# Patient Record
Sex: Male | Born: 1945 | Race: White | Hispanic: No | Marital: Single | Smoking: Never smoker
Health system: Southern US, Community
[De-identification: ages and names within clinical notes are randomized; demographics above are authoritative.]

## PROBLEM LIST (undated history)

## (undated) DIAGNOSIS — F329 Major depressive disorder, single episode, unspecified: Secondary | ICD-10-CM

## (undated) DIAGNOSIS — I10 Essential (primary) hypertension: Secondary | ICD-10-CM

## (undated) DIAGNOSIS — N390 Urinary tract infection, site not specified: Secondary | ICD-10-CM

## (undated) DIAGNOSIS — F1010 Alcohol abuse, uncomplicated: Secondary | ICD-10-CM

## (undated) DIAGNOSIS — F32A Depression, unspecified: Secondary | ICD-10-CM

---

## 1998-03-12 ENCOUNTER — Inpatient Hospital Stay (HOSPITAL_COMMUNITY): Admission: EM | Admit: 1998-03-12 | Discharge: 1998-03-13 | Payer: Self-pay | Admitting: Emergency Medicine

## 2000-11-21 ENCOUNTER — Emergency Department (HOSPITAL_COMMUNITY): Admission: EM | Admit: 2000-11-21 | Discharge: 2000-11-21 | Payer: Self-pay | Admitting: Internal Medicine

## 2000-11-21 ENCOUNTER — Encounter: Payer: Self-pay | Admitting: Internal Medicine

## 2002-04-08 ENCOUNTER — Encounter: Payer: Self-pay | Admitting: Emergency Medicine

## 2002-04-08 ENCOUNTER — Emergency Department (HOSPITAL_COMMUNITY): Admission: EM | Admit: 2002-04-08 | Discharge: 2002-04-08 | Payer: Self-pay | Admitting: Emergency Medicine

## 2003-12-02 ENCOUNTER — Emergency Department (HOSPITAL_COMMUNITY): Admission: EM | Admit: 2003-12-02 | Discharge: 2003-12-02 | Payer: Self-pay | Admitting: Emergency Medicine

## 2007-07-27 ENCOUNTER — Emergency Department (HOSPITAL_COMMUNITY): Admission: EM | Admit: 2007-07-27 | Discharge: 2007-07-27 | Payer: Self-pay | Admitting: Emergency Medicine

## 2007-07-27 IMAGING — CT CT CHEST W/ CM
2 of 3 series · 15 of 36 positions shown, 18 images · IV contrast (APPLIED)
Comparison: none

CLINICAL DATA: Fell [REDACTED], left shoulder pain. 
CHEST CT WITH CONTRAST:
TECHNIQUE: Multidetector CT imaging of the chest was performed following the standard protocol during bolus administration of intravenous contrast.
Contrast:  100 cc Omnipaque 300.

[Series 4: routine chest 5.0 st · axial · 0.57mm/px · z∈[-318,-38]mm · 12 of 66 slices shown, 15 images]
[im 5/66  mediastinal]
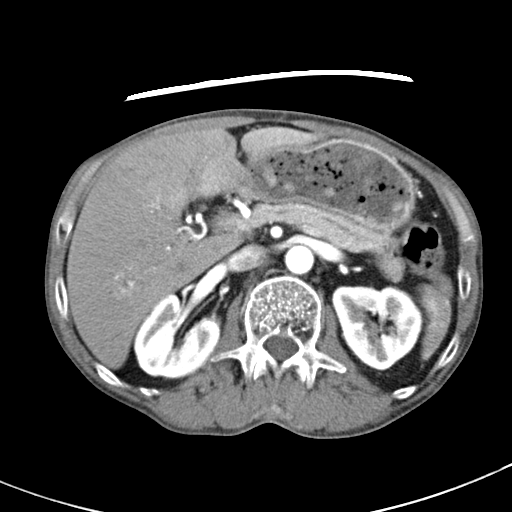
[im 5/66  lung]
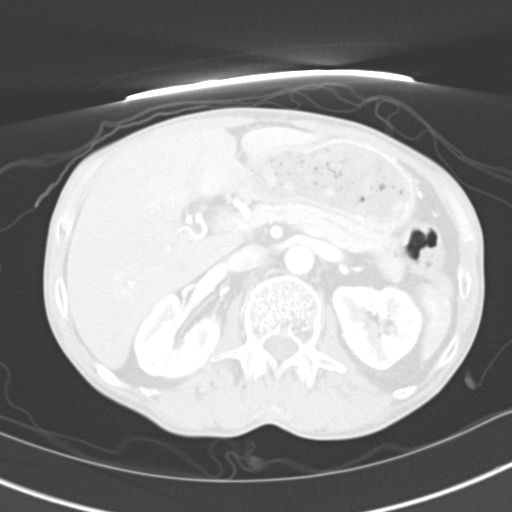
[im 10/66  lung]
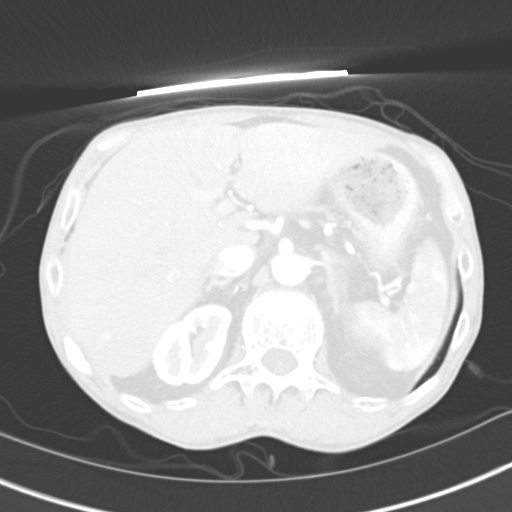
[im 15/66  lung]
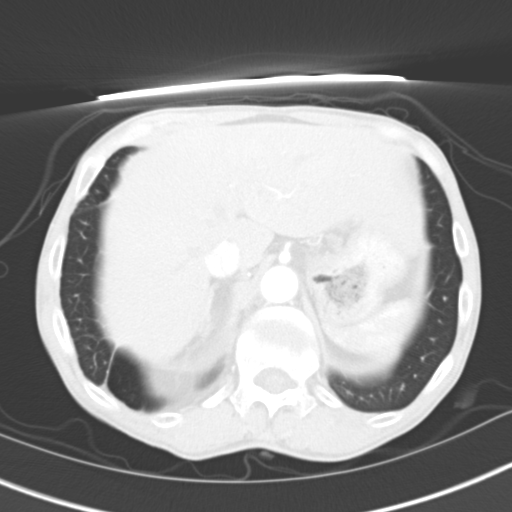
[im 20/66  lung]
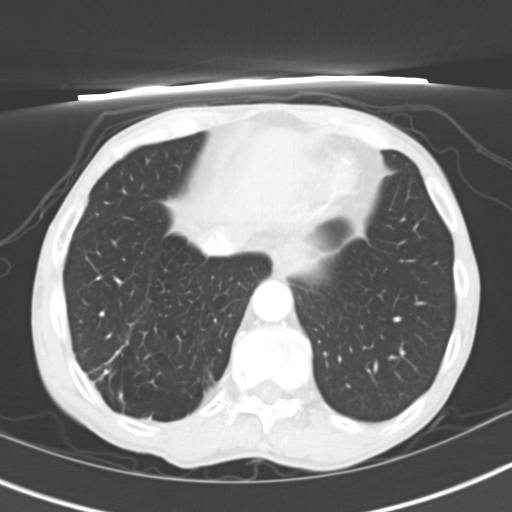
[im 25/66  mediastinal]
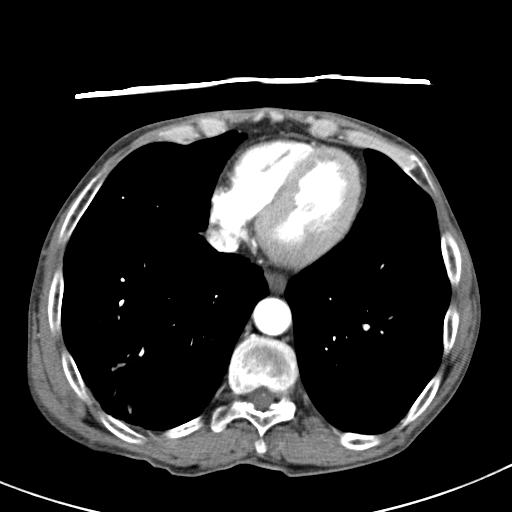
[im 25/66  lung]
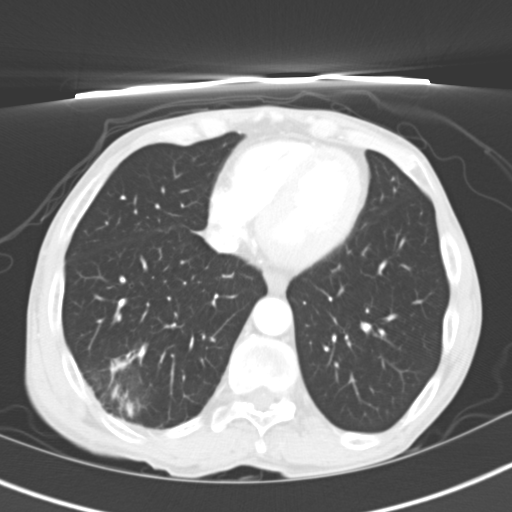
[im 29/66  lung]
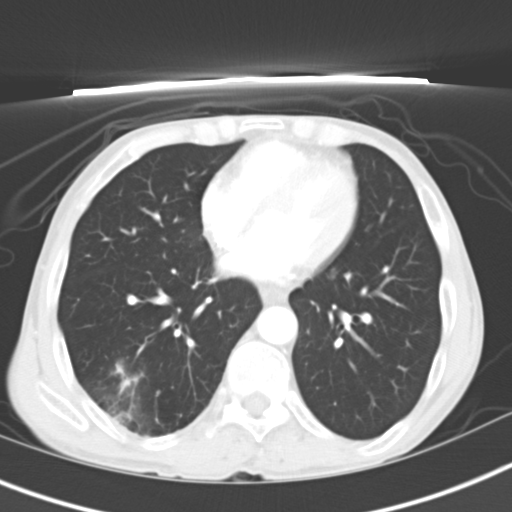
[im 37/66  lung]
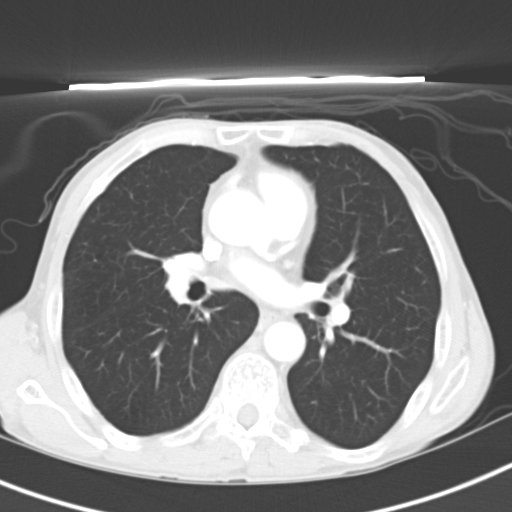
[im 41/66  lung]
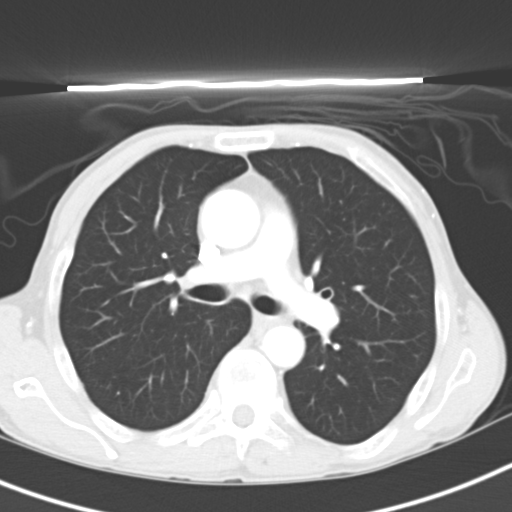
[im 46/66  mediastinal]
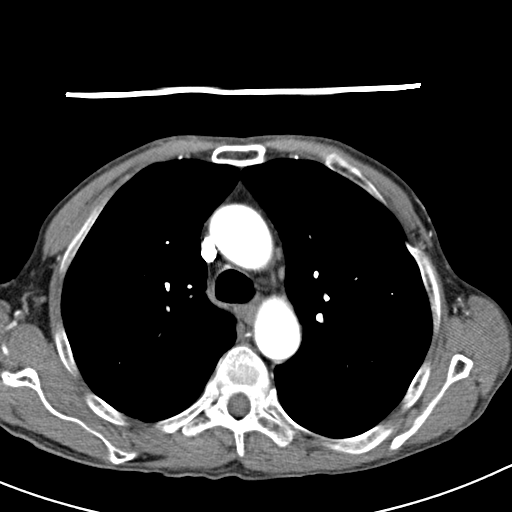
[im 46/66  lung]
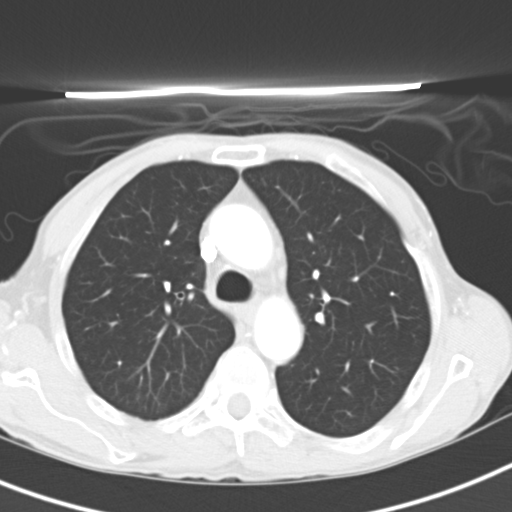
[im 51/66  lung]
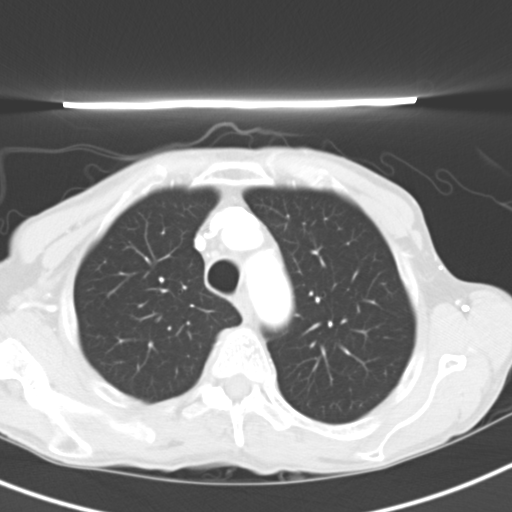
[im 56/66  lung]
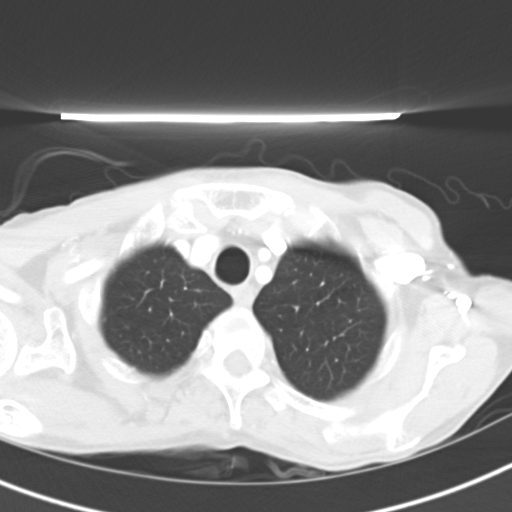
[im 61/66  lung]
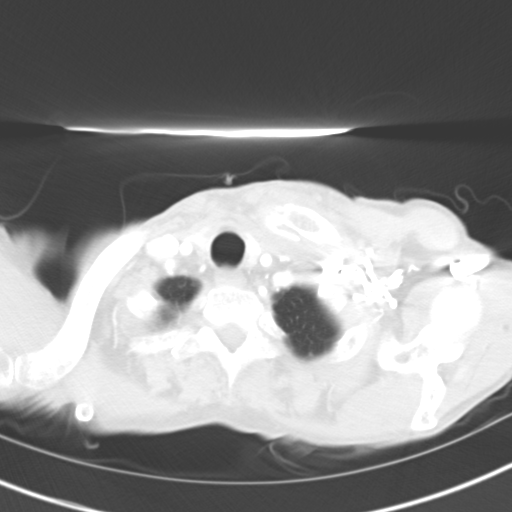

[Series 602: coronal chest · coronal · 0.64mm/px · 3 of 102 slices shown]
[im 21/102  lung]
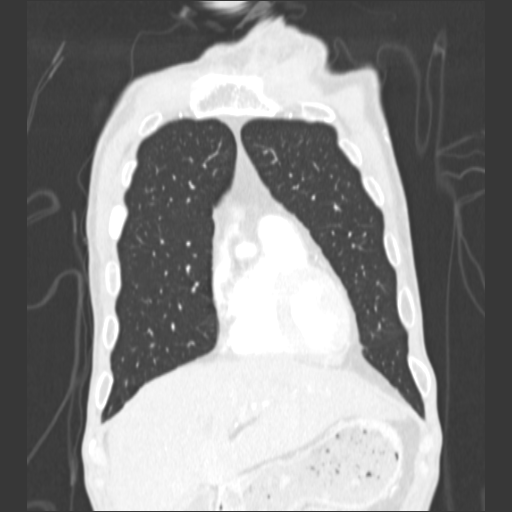
[im 41/102  lung]
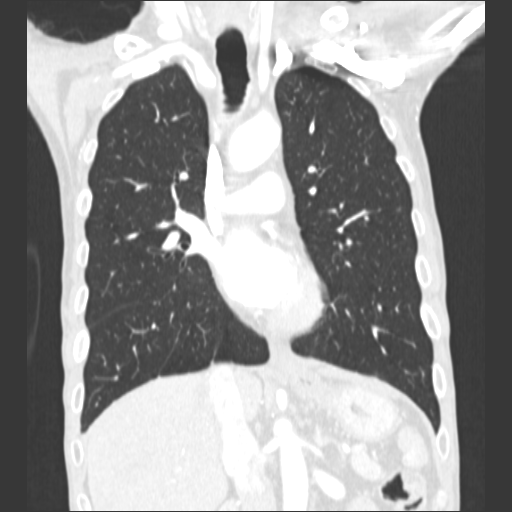
[im 61/102  lung]
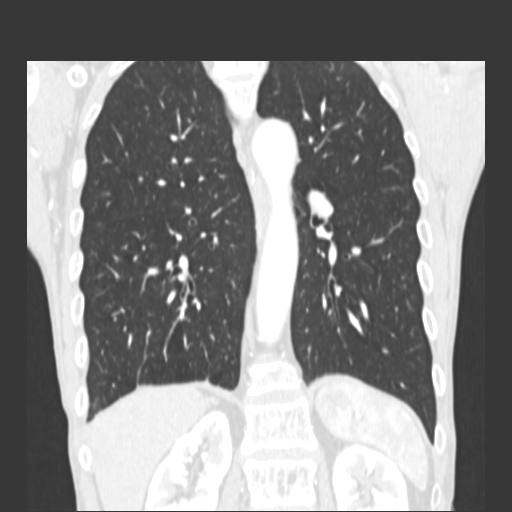

[15 of 36 positions shown; findings below may reference images not displayed]

FINDINGS: On lung window images there is opacity posteriorly in the right lower lobe corresponding to the area questioned on chest x-ray at the right lung base.   This could represent developing pneumonia with atelectasis also a consideration.  There are healing anterior right 4th and 5th rib fractures with some callus.  No pneumothorax is seen and no pleural effusion is noted.  No mediastinal or hilar adenopathy is seen.  The pulmonary arteries and thoracic aorta opacify with no acute abnormality.  Coronary artery calcifications are noted.  Probable hemangioma is noted within the right lobe of liver posteriorly with peripheral enhancement, but further evaluation with CT or MRI of the abdomen may be helpful.
IMPRESSION: Opacity at right lung base corresponds to parenchymal opacity in the right lower lobe by CT of the chest.  consideration is that of pneumonia

## 2007-07-27 IMAGING — CR DG SHOULDER 2+V*L*
3 series · 3 of 3 positions shown · non-contrast
Comparison: None.

LEFT SHOULDER - 3  VIEW:

CLINICAL DATA: Fell 2 days ago. Shoulder pain.

[w shoulder ap internal left]
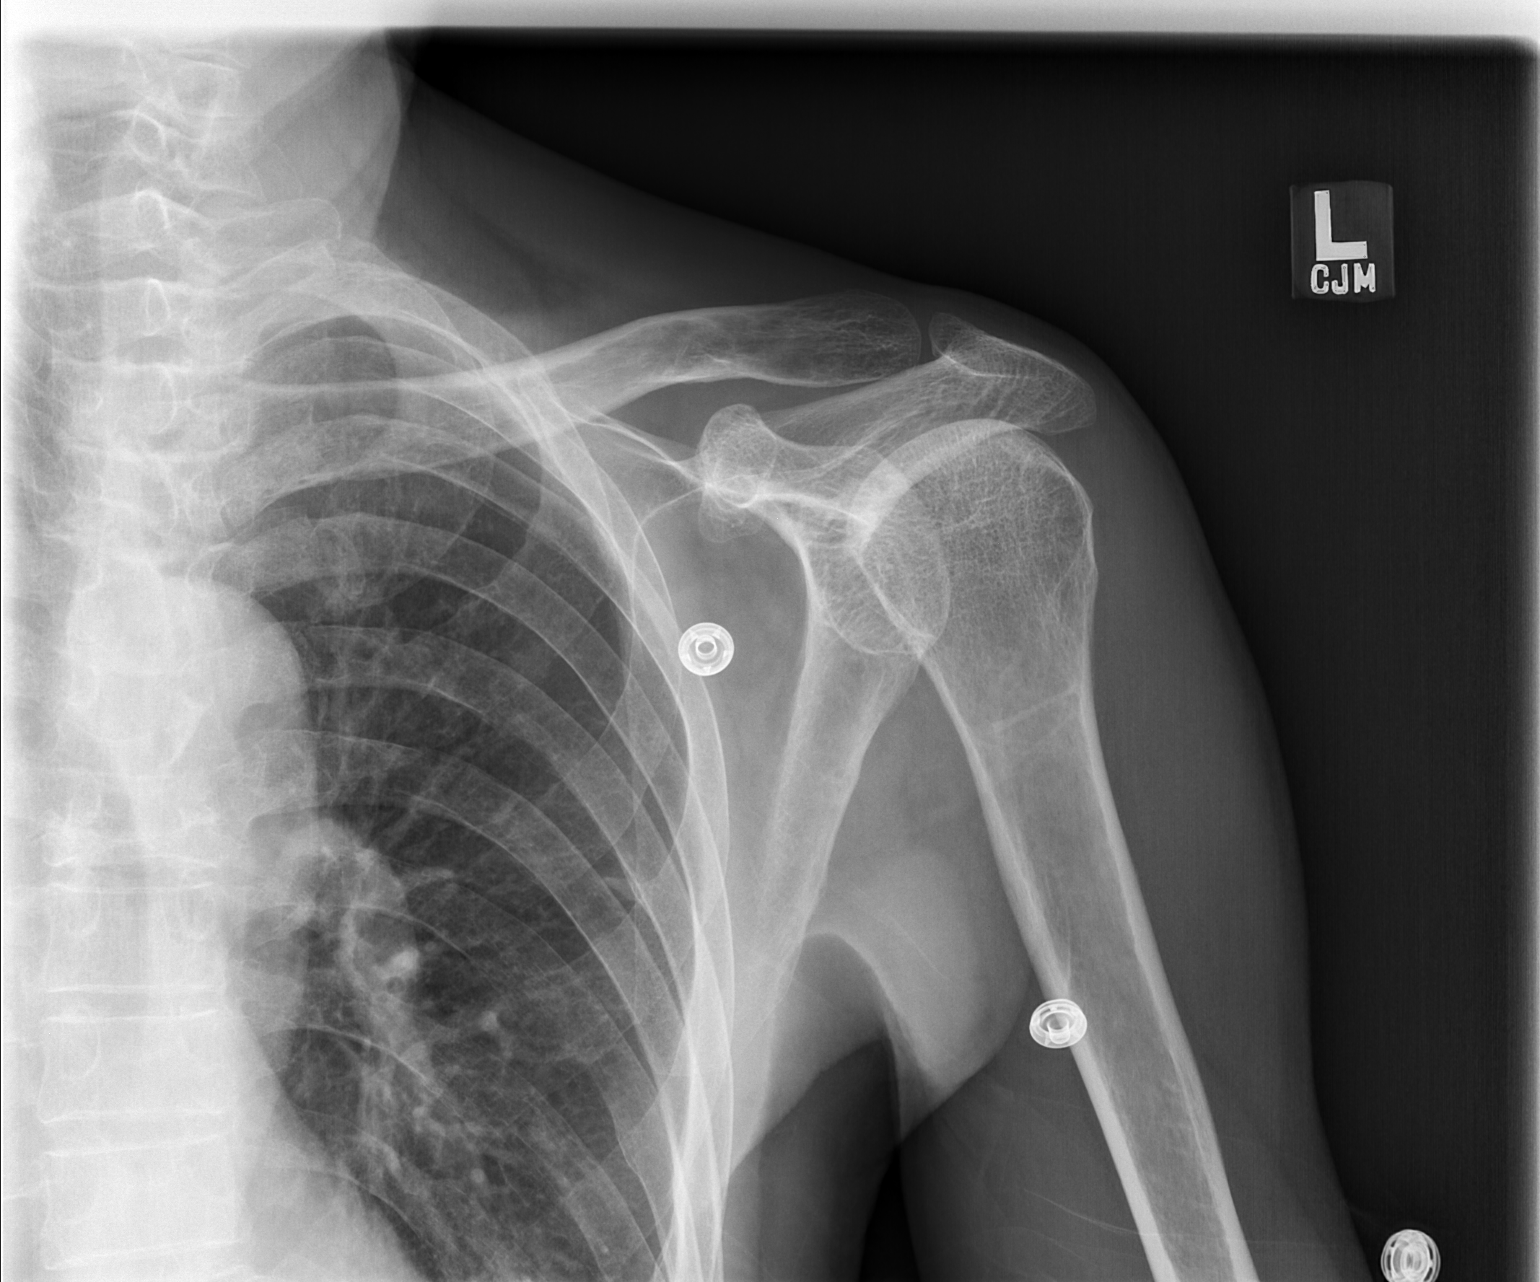

[w shoulder ap external left]
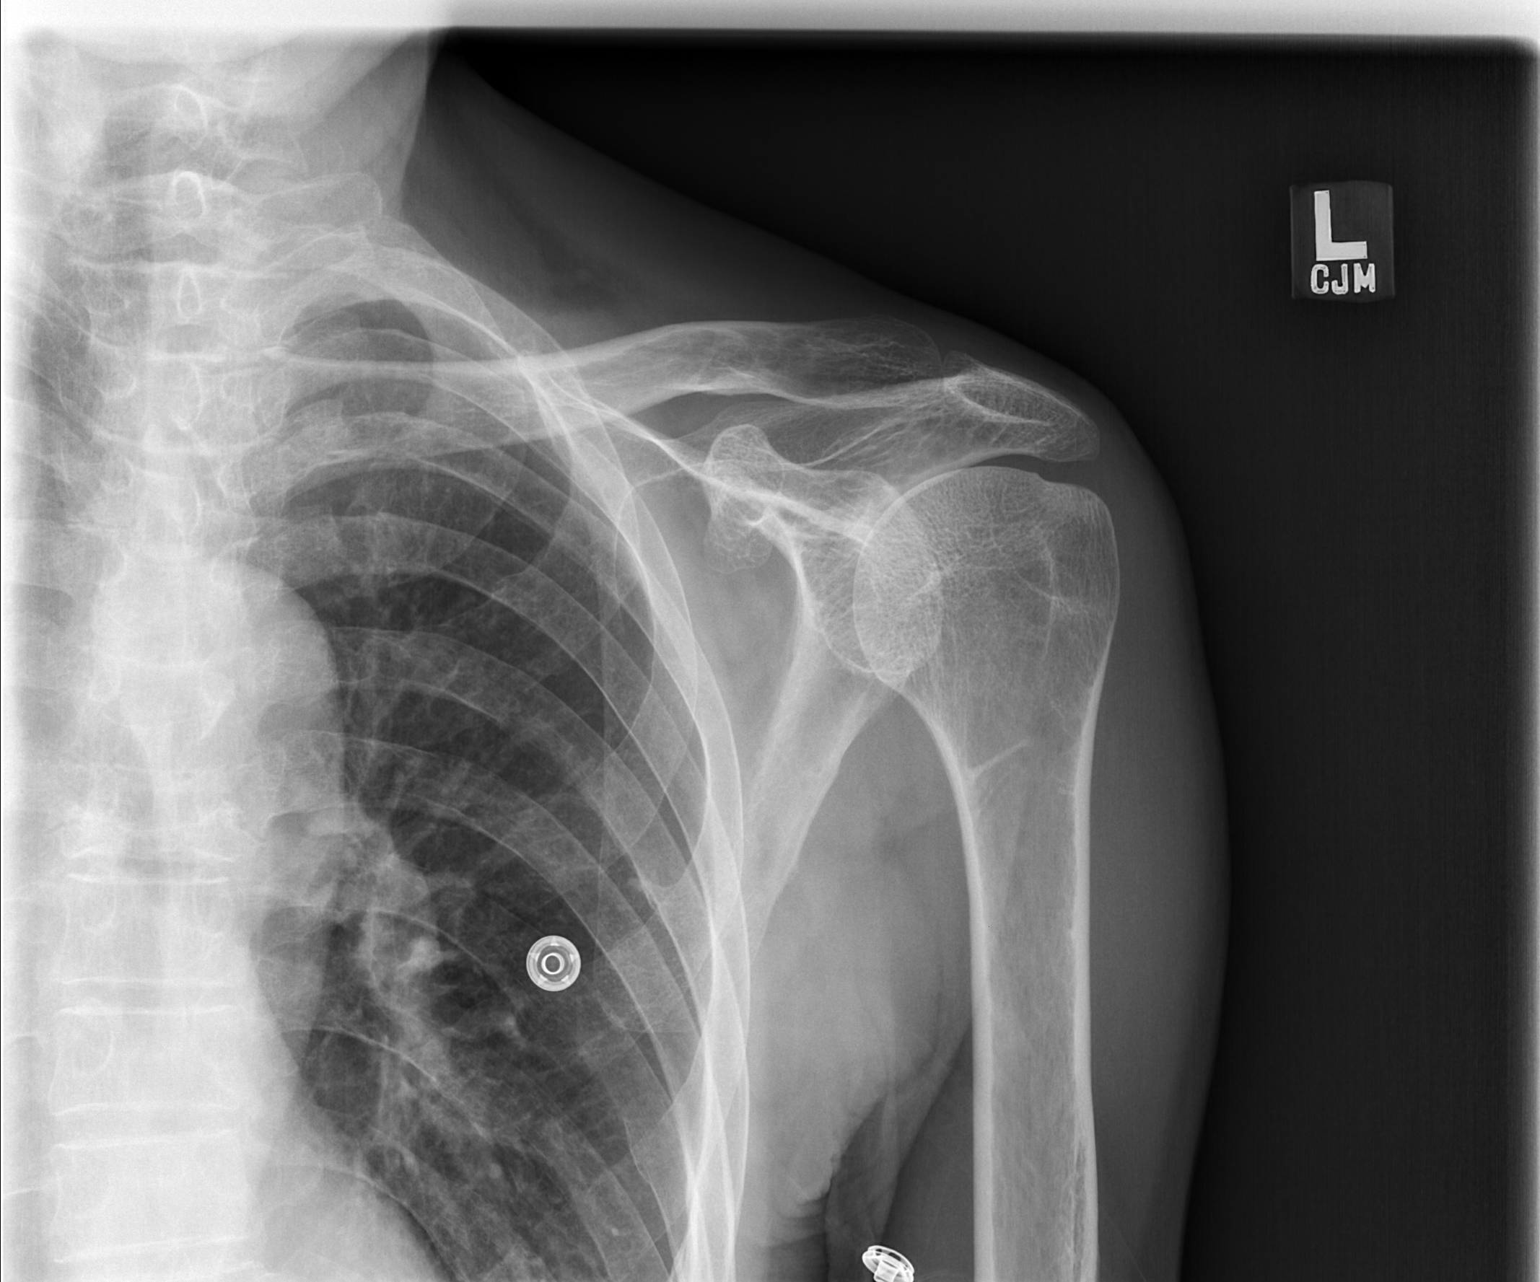

[w shoulder y view left]
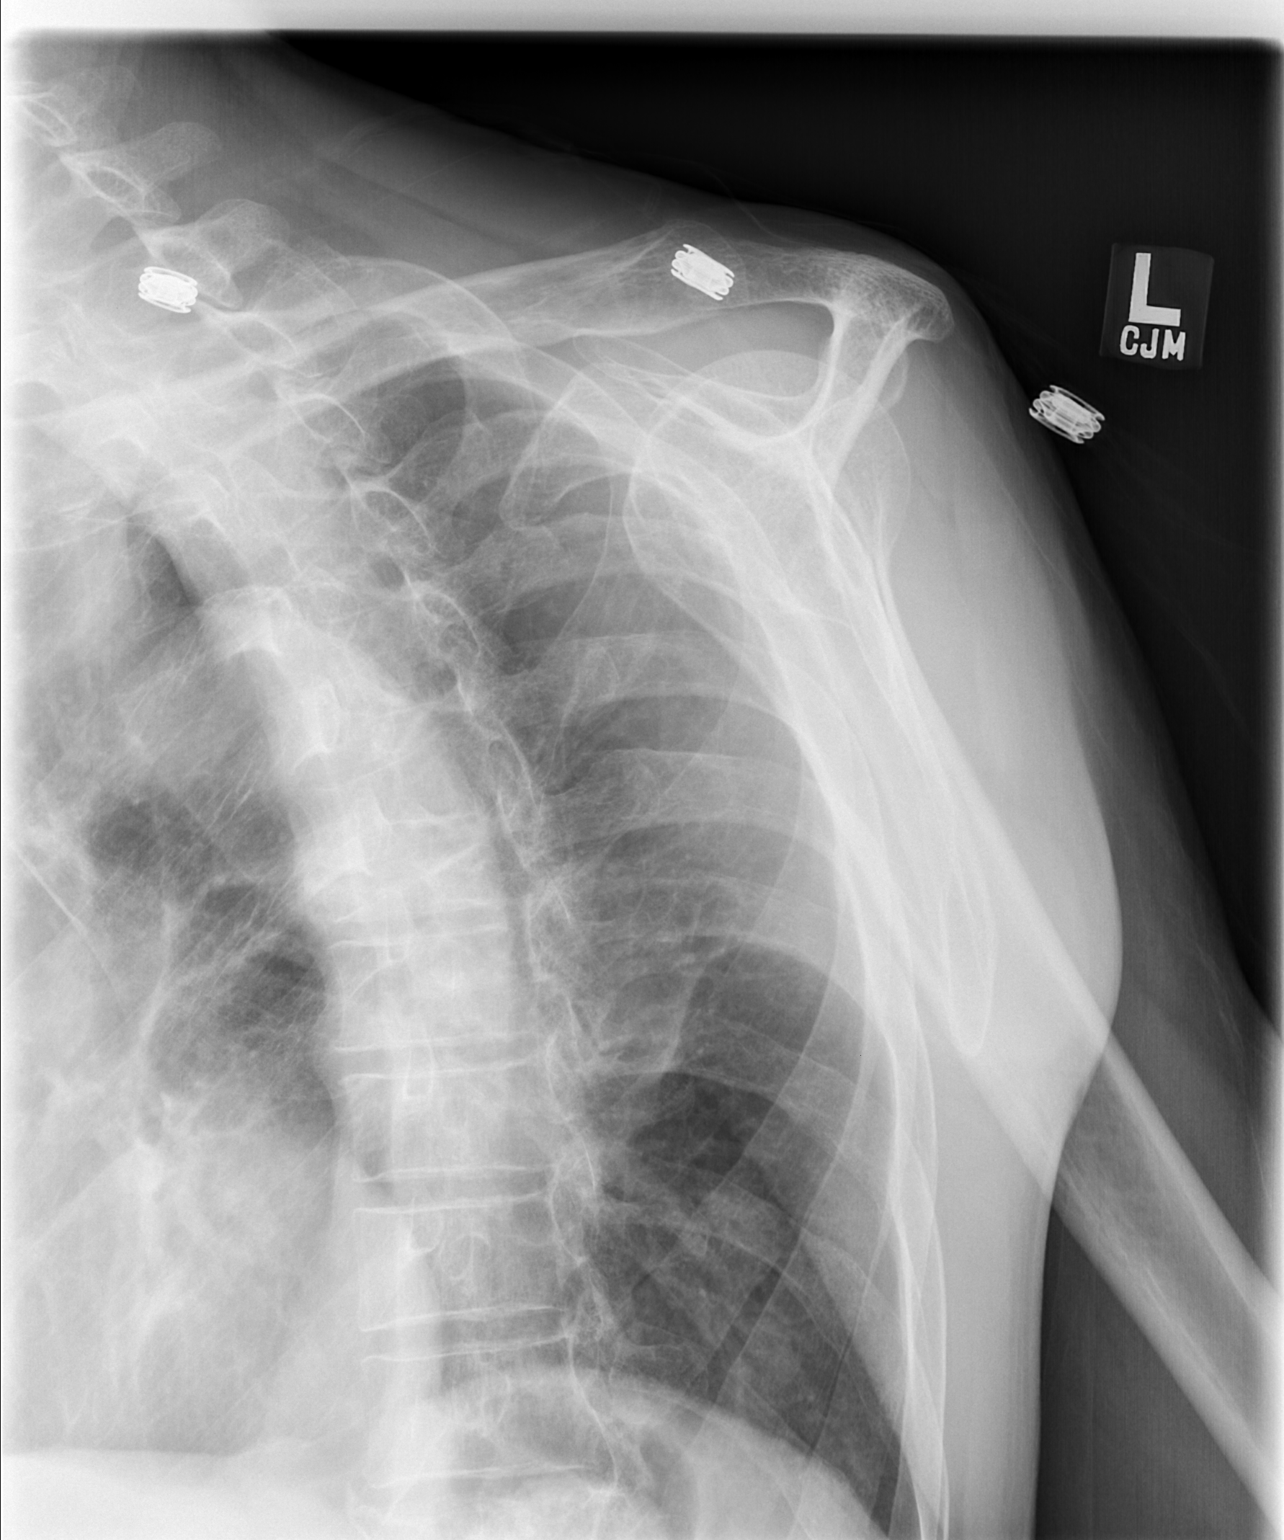

[3 of 3 positions shown; findings below may reference images not displayed]

FINDINGS: No evidence for acute fracture, dislocation, or separation. Nonacute
fracture of the posterior left 8th rib is evident. Bones are diffusely
demineralized.
IMPRESSION: No acute bony abnormality.

## 2007-07-27 IMAGING — CR DG CHEST 2V
2 series · 2 of 2 positions shown · non-contrast
Comparison: None

CLINICAL DATA: Fall with sternal bruising in the shoulder pain.

CHEST - 2 VIEW

[w chest pa]
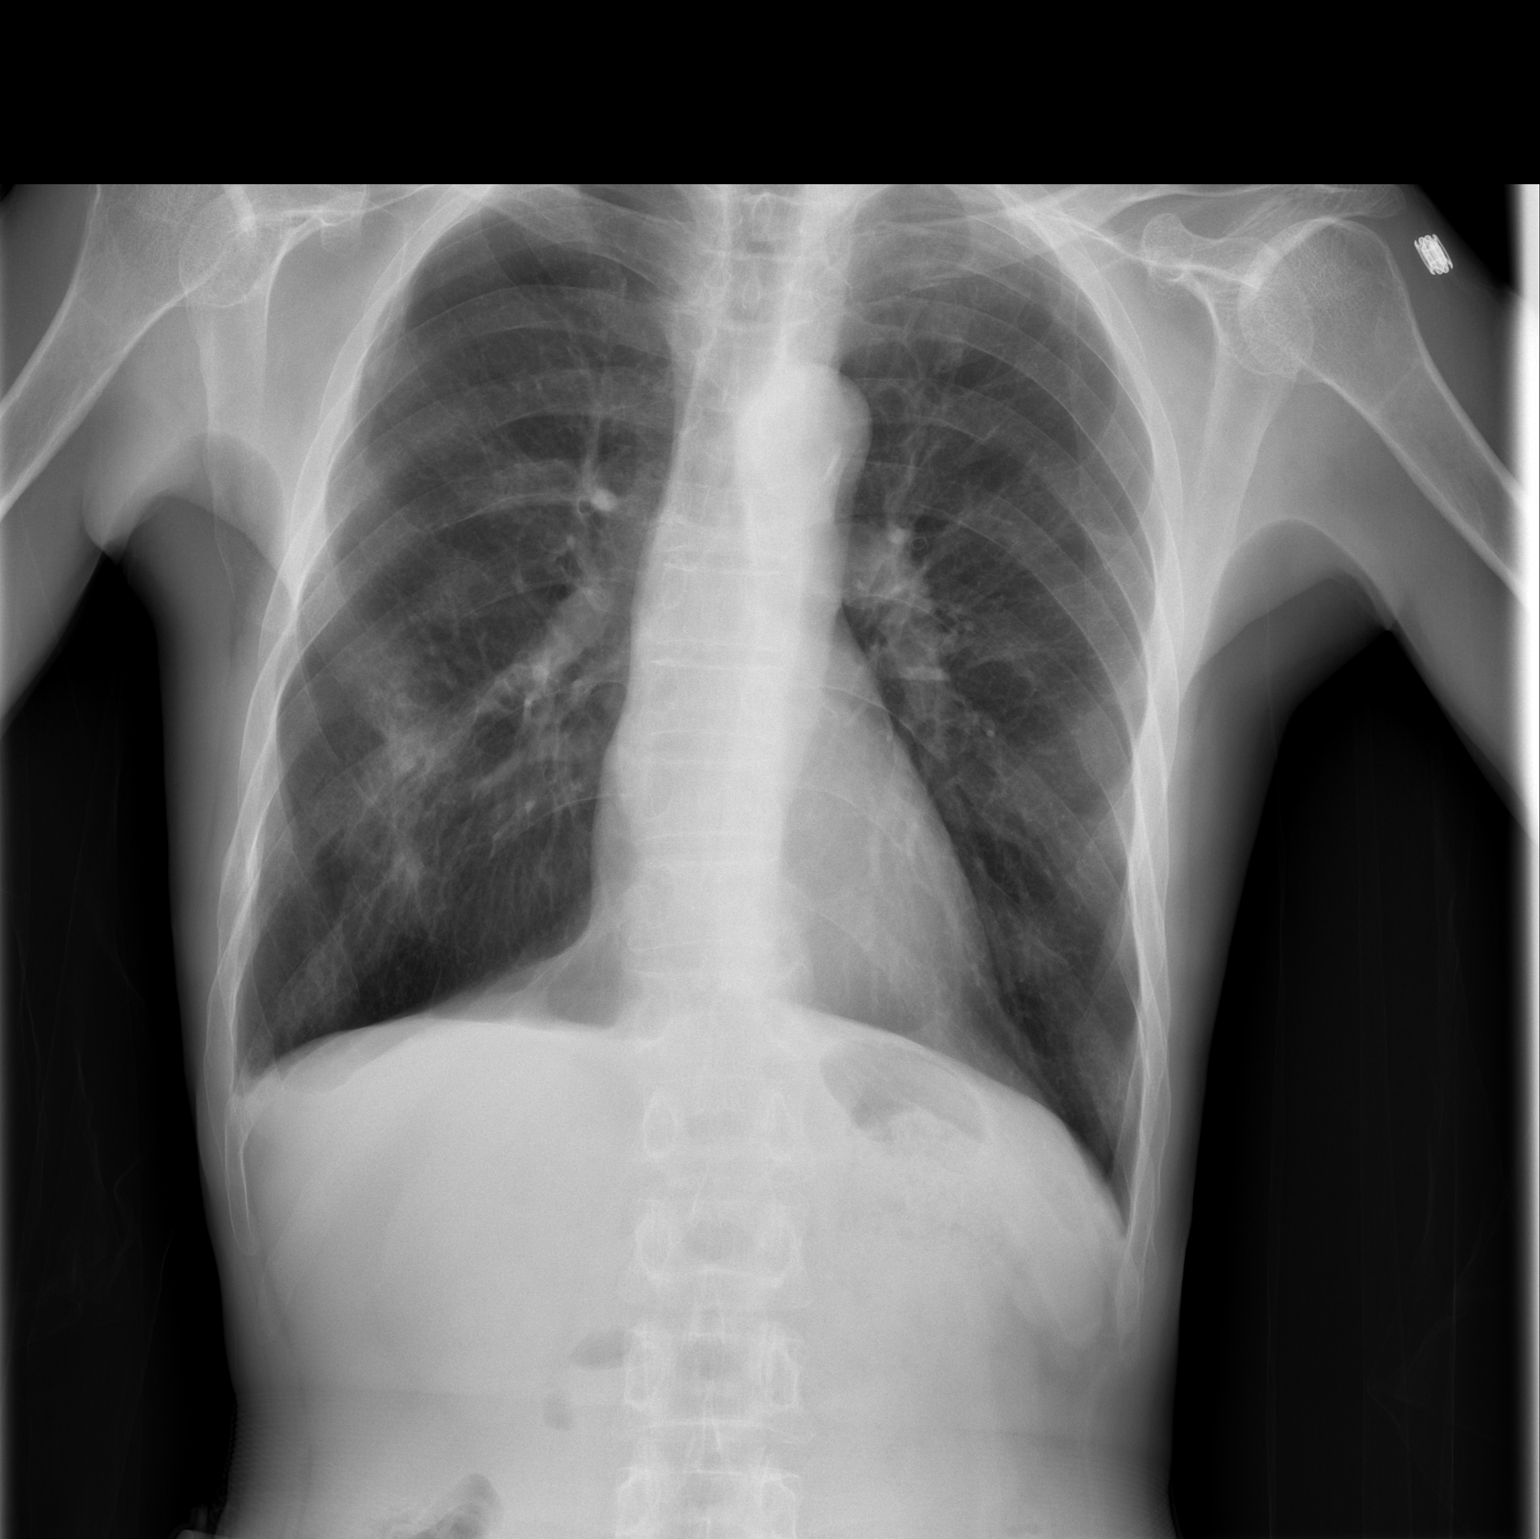

[w chest lat]
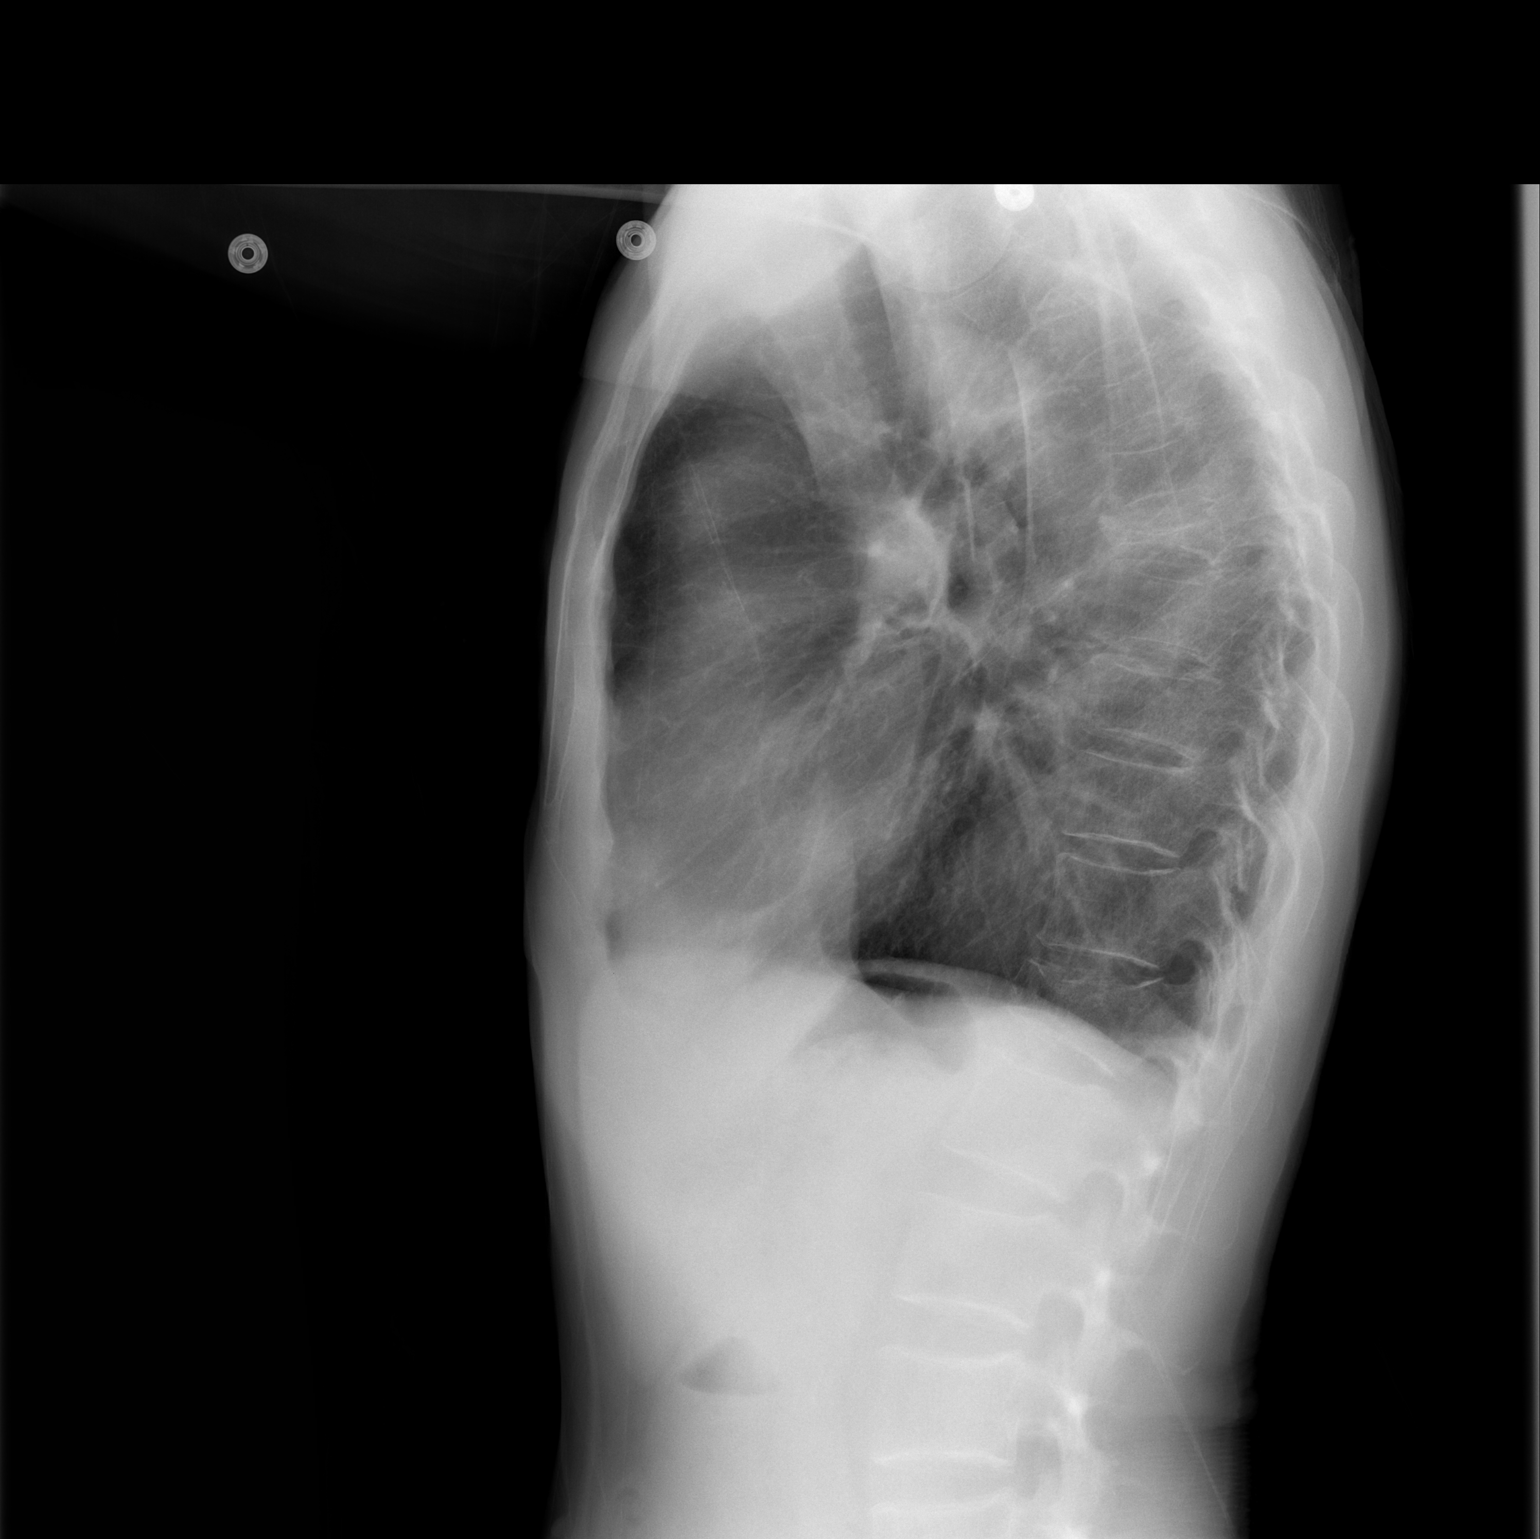

[2 of 2 positions shown; findings below may reference images not displayed]

FINDINGS: There is abnormal indistinct opacity in the right lower lobe which
could represent pulmonary contusion, pneumonia, or possibly even lung cancer. CT
scan or careful conventional radiographic followup is recommended.

There is a suggestion of mild bony irregularity along the medial left clavicle.
Although this could be due to superimposition from the left first and fourth
ribs, the possibility of the medial left clavicular fracture is raised. This
could also be assessed on chest CT.

The cardiac and mediastinal contours appear unremarkable. A small blunted right
lateral costophrenic angle suggesting a small amount of right pleural fluid.
Mild irregularities of several bilateral rib suggest old rib fractures.

There is a nearly 100% compression fracture of the T7 vertebral body, with mild
sclerosis suggesting a subacute fracture. There is a also an anterior wedge
compression fracture of T12 which is probably old.

No pneumomediastinum is identified. No discrete pneumothorax is noted.

IMPRESSION

1. Ill-defined right lower lobe airspace opacity could represent pulmonary
contusion, localize pneumonia, or even lung cancer. CT scan of chest is
recommended for further evaluation.
2. Old bilateral rib fractures are noted.
3. A T7 compression fracture is likely subacute. There is also a superior
endplate compression fracture at T12 which is likely old.
4. Mild irregularity along the medial aspect of the left clavicle could
represent a nondisplaced clavicular fracture. This could also be assessed in
chest CT.
5. Trace right pleural effusion.

## 2009-07-20 ENCOUNTER — Emergency Department (HOSPITAL_COMMUNITY): Admission: EM | Admit: 2009-07-20 | Discharge: 2009-07-20 | Payer: Self-pay | Admitting: Emergency Medicine

## 2009-11-01 ENCOUNTER — Emergency Department (HOSPITAL_COMMUNITY): Admission: EM | Admit: 2009-11-01 | Discharge: 2009-11-01 | Payer: Self-pay | Admitting: Emergency Medicine

## 2009-11-01 IMAGING — CR DG LUMBAR SPINE COMPLETE 4+V
5 series · 5 of 5 positions shown · non-contrast
Comparison: Chest CT [DATE]

CLINICAL DATA: Back pain.  Left flank pain.

LUMBAR SPINE - COMPLETE 4+ VIEW

[t l-spine a.p.]
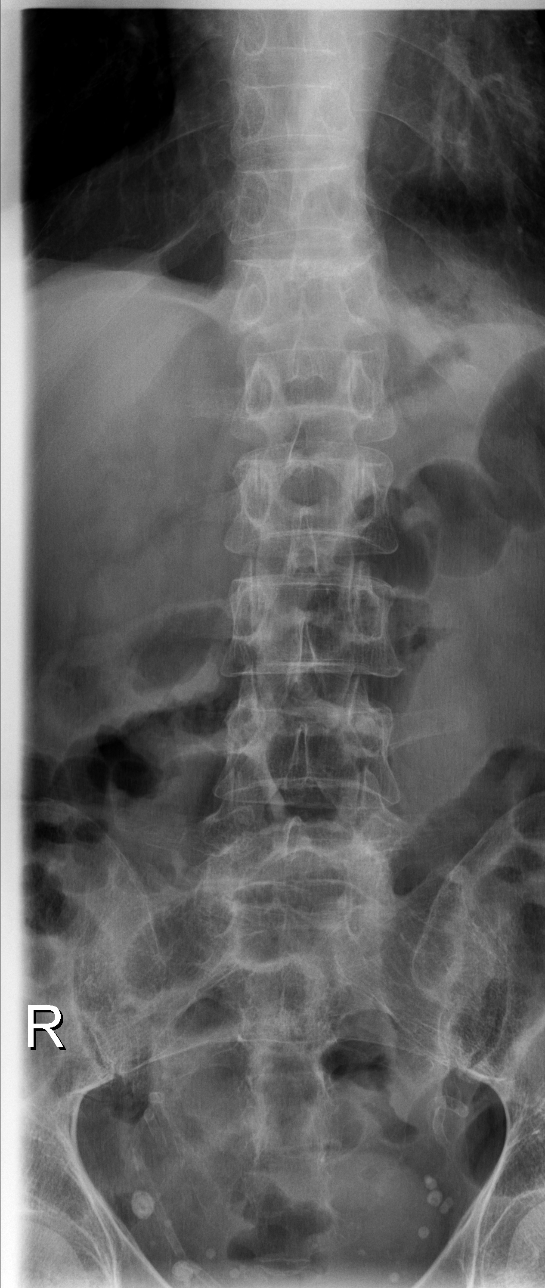

[t l-spine oblique exposure (1 of 2)]
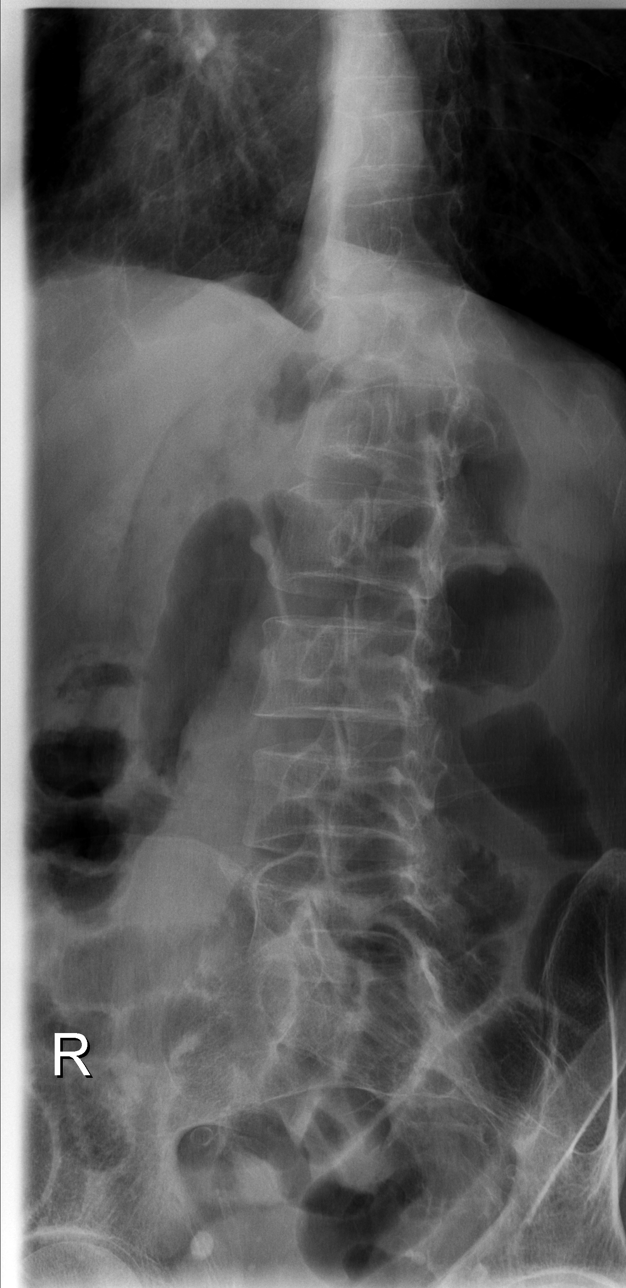

[t l-spine oblique exposure (2 of 2)]
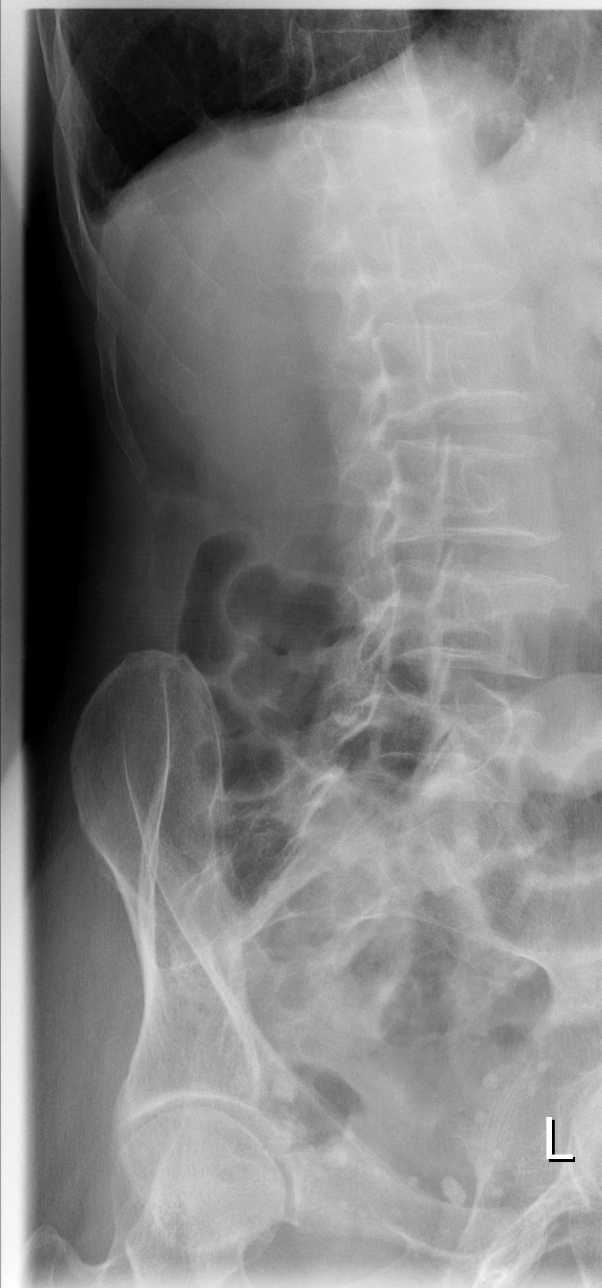

[t l-spine lat]
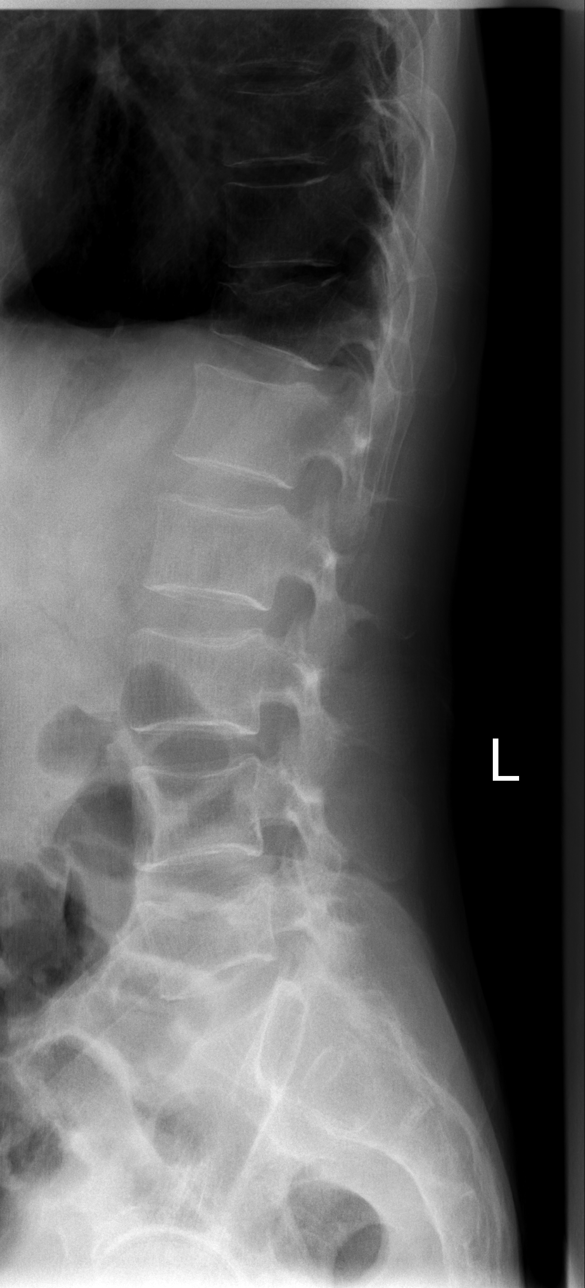

[t l-spine l5-s1 spot]
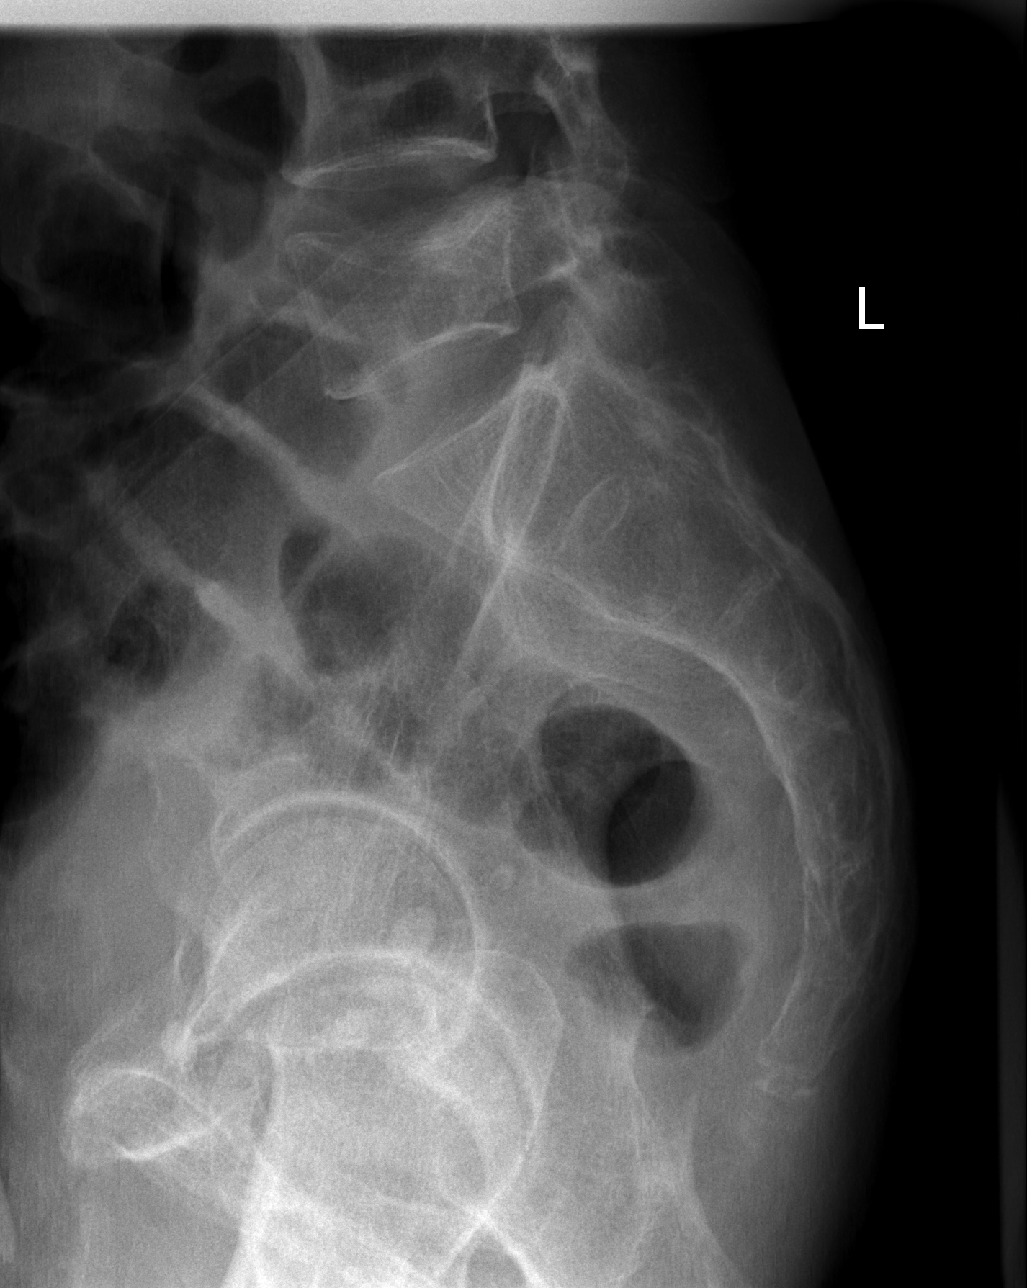

[5 of 5 positions shown; findings below may reference images not displayed]

FINDINGS: There is an old compression deformity at T12 which
appears the same.  There is some loss of height that the superior
endplate of L5, age indeterminate.  Disc space heights are normal.
No facet arthropathy.  Sacroiliac joints appear unremarkable.
IMPRESSION: Old compression fracture at T12, based on comparison with chest CT
[DATE].

Superior endplate deformity at L5.  I suspect this is probably old,
but cannot state that with certainty as I do not have any
comparison exam.

## 2009-11-01 IMAGING — CR DG RIBS W/ CHEST 3+V*L*
5 series · 5 of 5 positions shown · non-contrast
Comparison: CT [DATE]

CLINICAL DATA: Left flank pain.  Back pain.

LEFT RIBS AND CHEST - 3+ VIEW

[t ribs obl. left (1 of 2)]
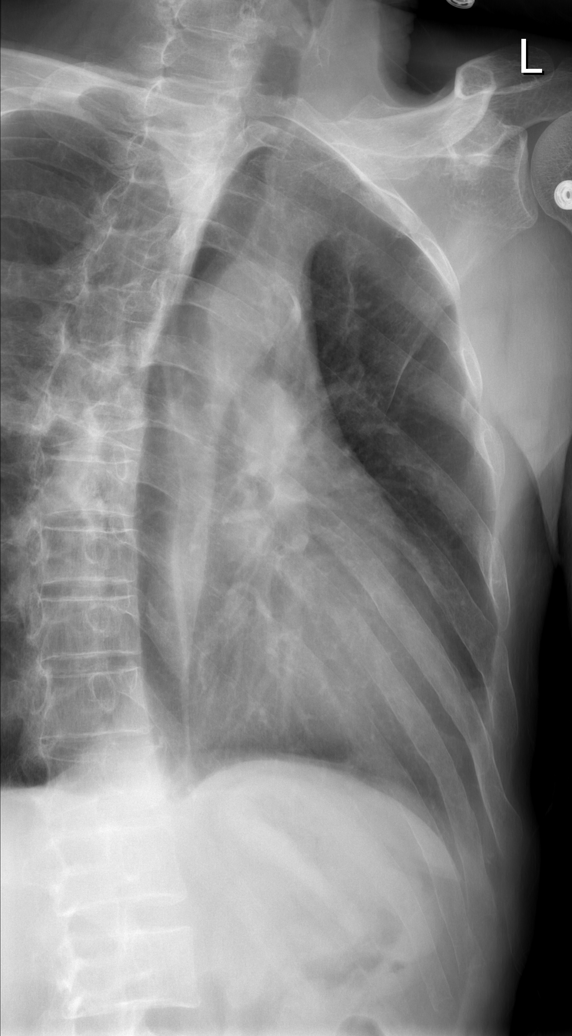

[t ribs obl. left (2 of 2)]
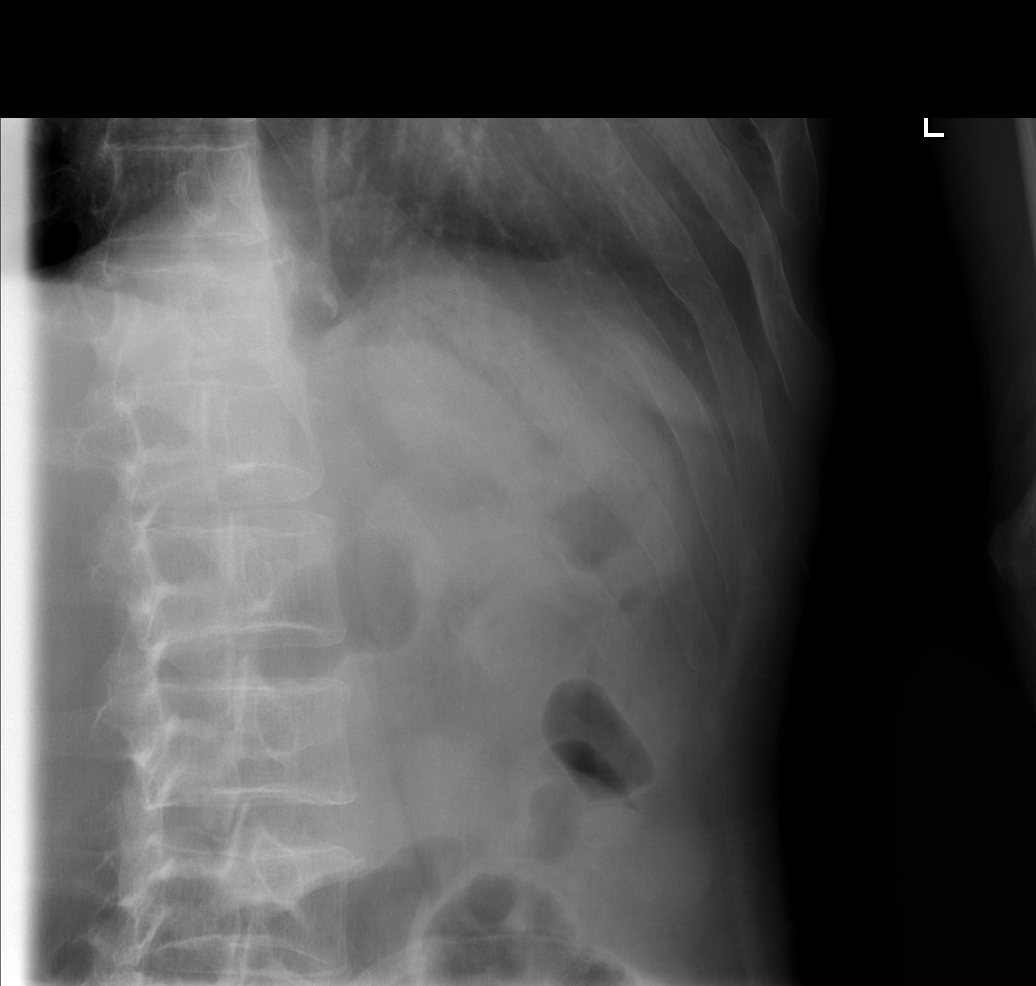

[t ribs ap/pa upper left]
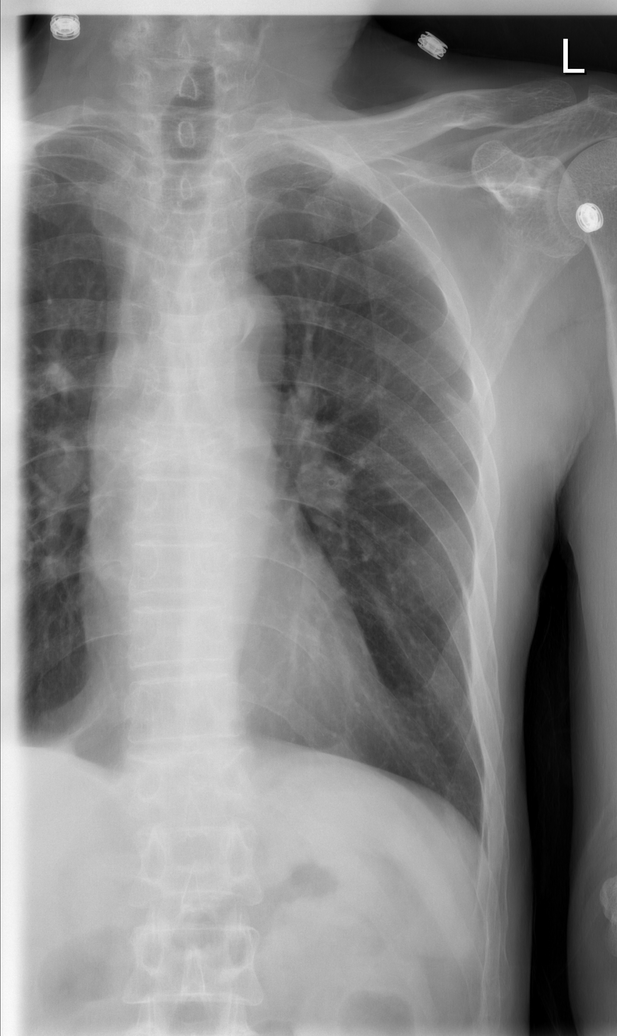

[t ribs ap/pa  lower left]
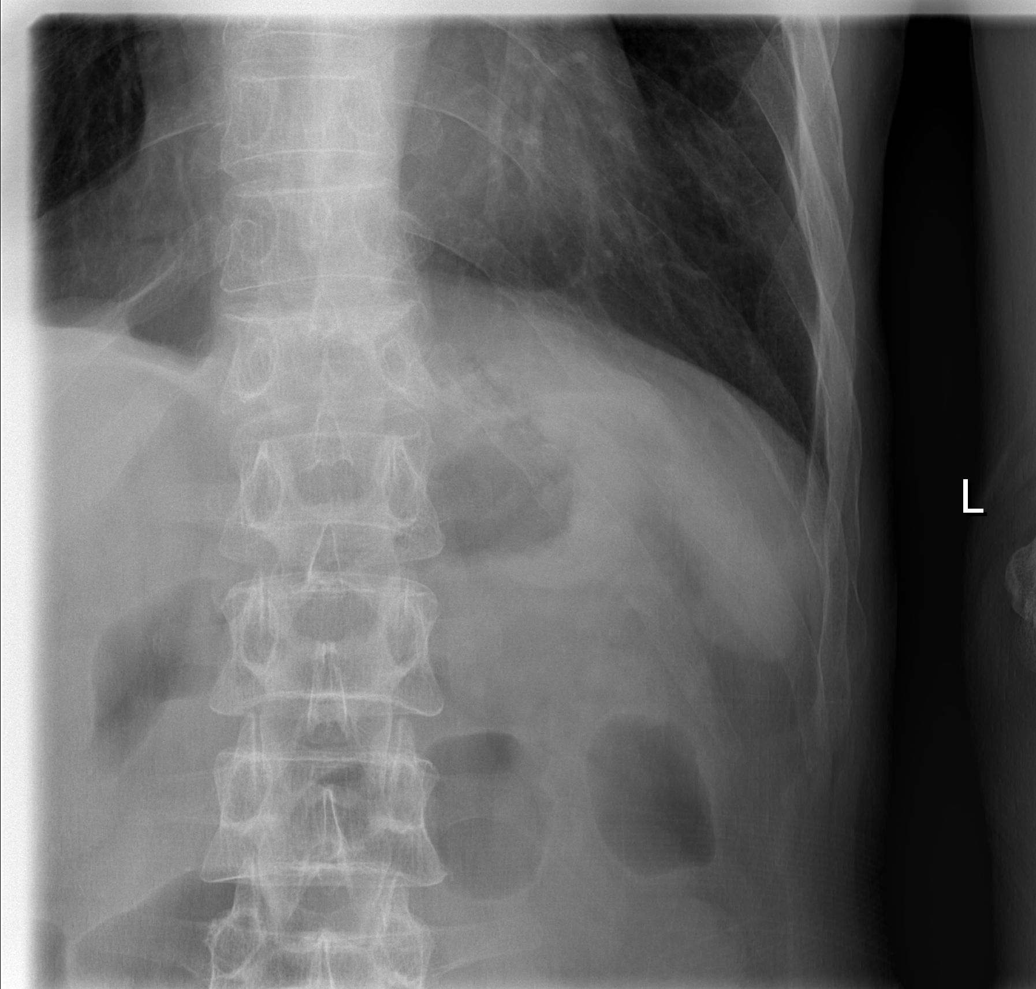

[w chest pa]
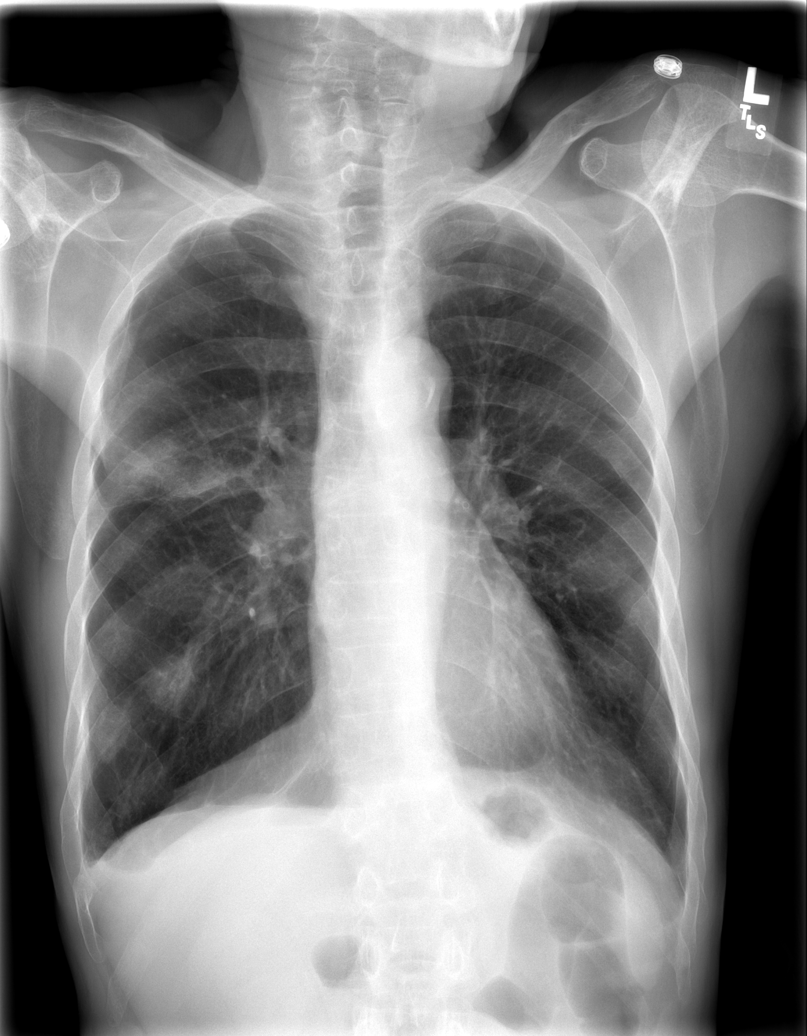

[5 of 5 positions shown; findings below may reference images not displayed]

FINDINGS: There are old healed rib fractures on the left.  No
discernible acute fracture.  The left lung appears clear.

Heart size is normal.  There is calcification of the thoracic
aorta.  There is abnormal pulmonary density in the right upper
lobe.  This could represent pneumonia or mass lesion.  There is
also focal density projected over the right lower lobe.  This could
relate to healing rib fracture or a pulmonary density.
IMPRESSION: No rib lesions seen on the left of an acute nature.  Old healed
fractures.

Apparent pulmonary density in the right upper lobe that could be
pneumonia or mass.  Density projected over the right lower lobe
that could relate to a healed or healing rib fracture or could be a
pulmonary density.

## 2009-11-01 IMAGING — CR DG ABDOMEN 1V
1 series · 1 of 1 positions shown · non-contrast
Comparison: None

CLINICAL DATA: Back pain.  Left flank pain.

ABDOMEN - 1 VIEW

[t abdomen supine]
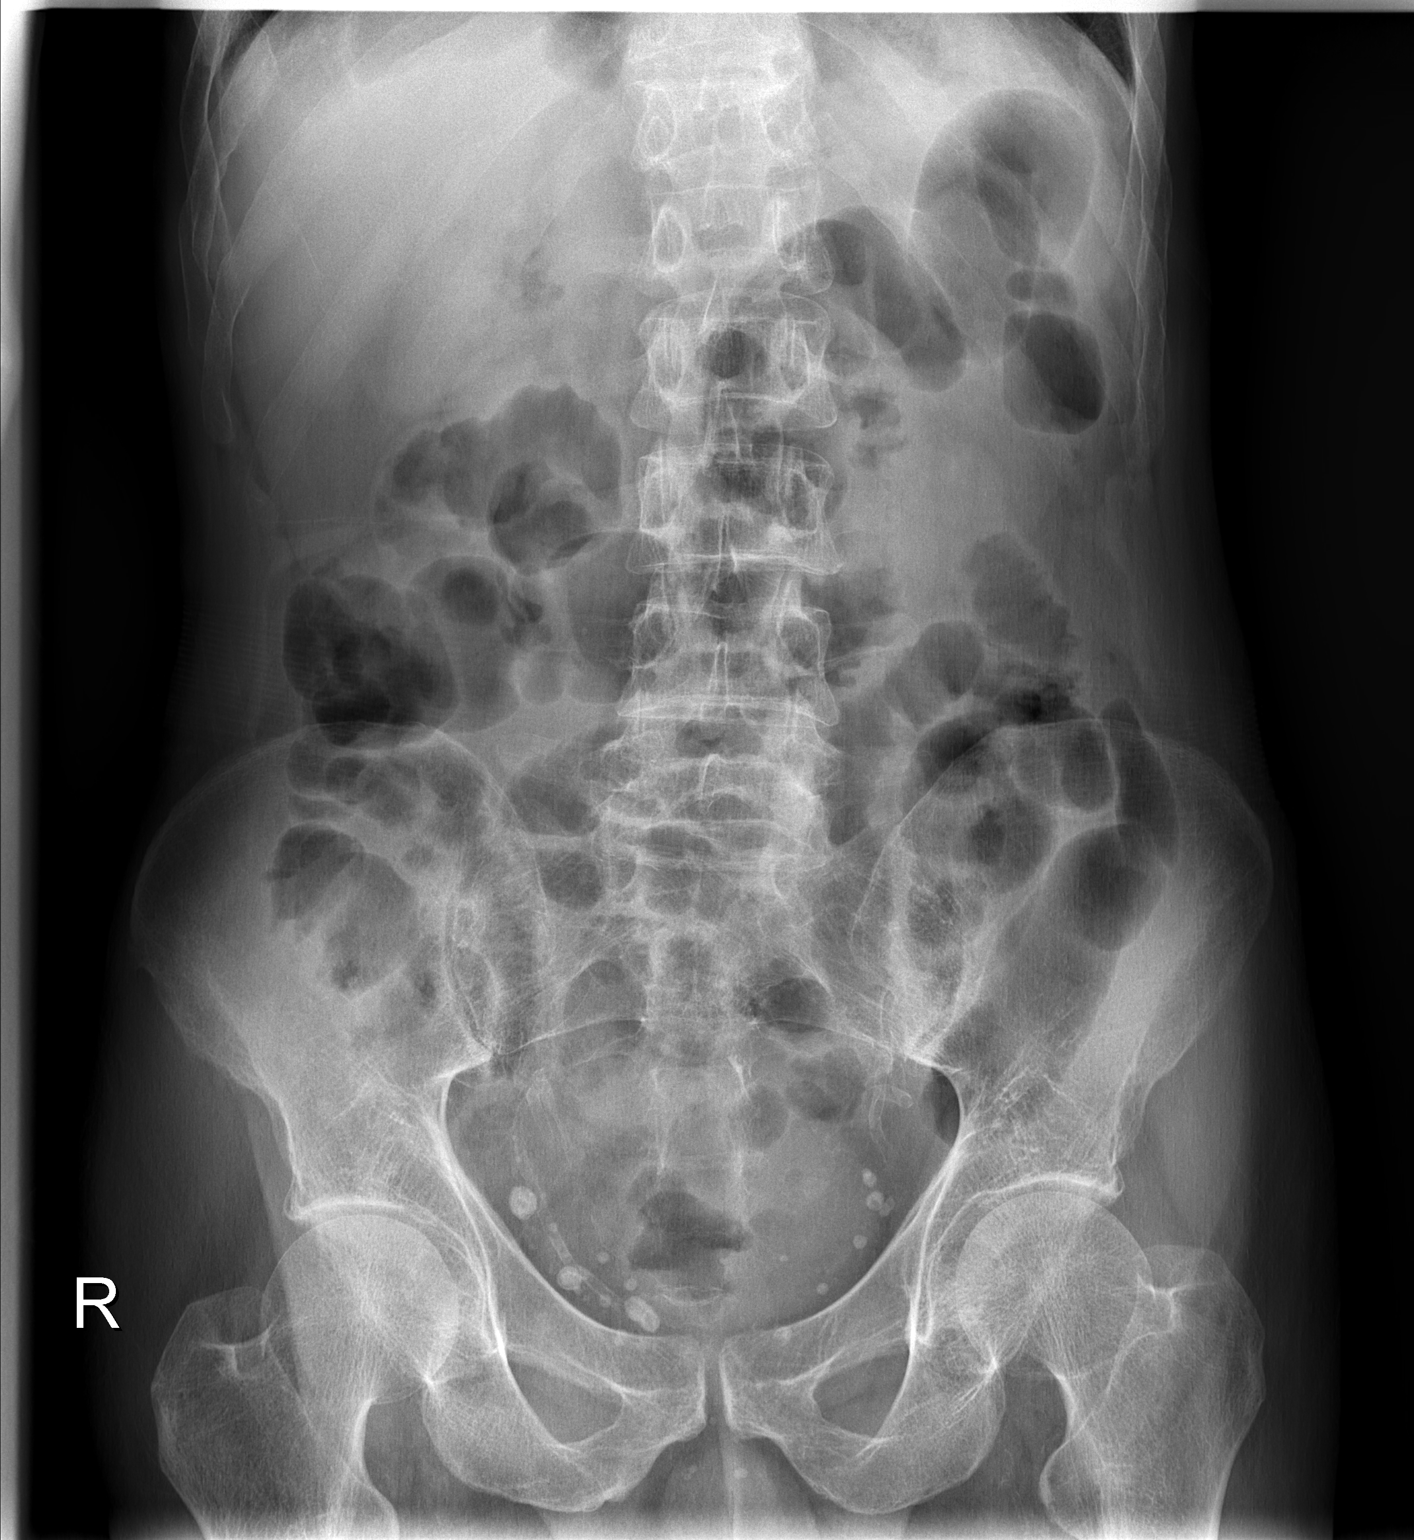

[1 of 1 positions shown; findings below may reference images not displayed]

FINDINGS: There is gas within small and large bowel but the pattern
is within normal limits.  No sign of urinary tract calcifications.
There are phleboliths in the pelvis.  There is vascular
calcification in the pelvis.  No acute bony finding.  Compression
deformity at T12.  See lumbar report.
IMPRESSION: No acute abdominal finding.  Fairly prominent amount of intestinal
gas, but within normal limits.

## 2009-11-07 ENCOUNTER — Emergency Department (HOSPITAL_COMMUNITY): Admission: EM | Admit: 2009-11-07 | Discharge: 2009-11-07 | Payer: Self-pay | Admitting: Emergency Medicine

## 2009-11-11 ENCOUNTER — Emergency Department (HOSPITAL_COMMUNITY): Admission: EM | Admit: 2009-11-11 | Discharge: 2009-11-11 | Payer: Self-pay | Admitting: Emergency Medicine

## 2009-11-16 ENCOUNTER — Emergency Department (HOSPITAL_COMMUNITY): Admission: EM | Admit: 2009-11-16 | Discharge: 2009-11-16 | Payer: Self-pay | Admitting: Emergency Medicine

## 2009-11-18 ENCOUNTER — Emergency Department (HOSPITAL_COMMUNITY): Admission: EM | Admit: 2009-11-18 | Discharge: 2009-11-18 | Payer: Self-pay | Admitting: Emergency Medicine

## 2009-11-25 ENCOUNTER — Emergency Department (HOSPITAL_COMMUNITY): Admission: EM | Admit: 2009-11-25 | Discharge: 2009-11-25 | Payer: Self-pay | Admitting: Emergency Medicine

## 2009-11-25 IMAGING — CT CT ABD-PELV W/O CM
2 of 4 series · 16 of 46 positions shown, 18 images · non-contrast
Comparison: Scan of the chest [DATE]

CLINICAL DATA: Back pain bilaterally

CT ABDOMEN AND PELVIS WITHOUT CONTRAST
TECHNIQUE: Multidetector CT imaging of the abdomen and pelvis was
performed following the standard protocol without intravenous
contrast.

[Series 2: stone <(id) >(id) · axial · 0.56mm/px · z∈[-414,-54]mm · 13 of 80 slices shown, 15 images]
[im 4/80  soft-tissue]
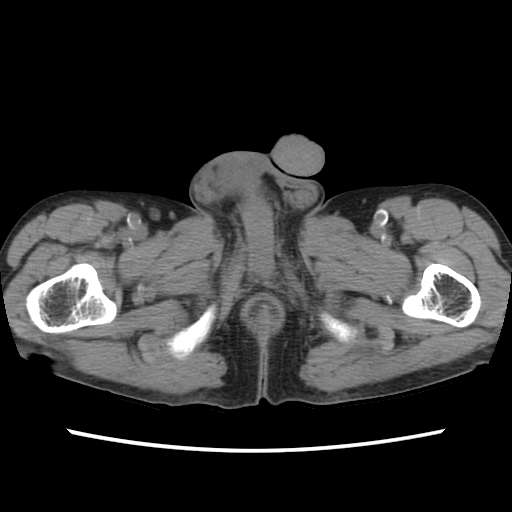
[im 4/80  bone]
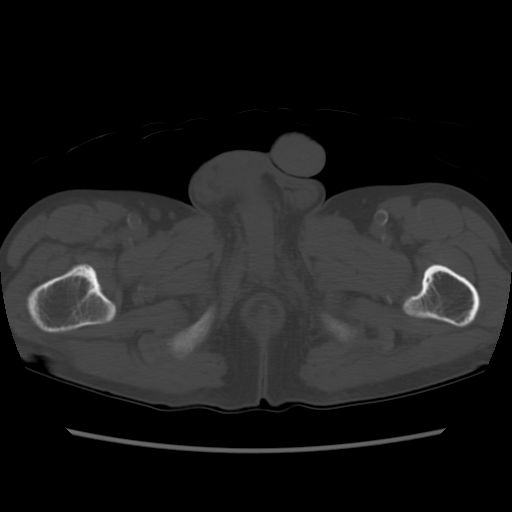
[im 10/80  soft-tissue]
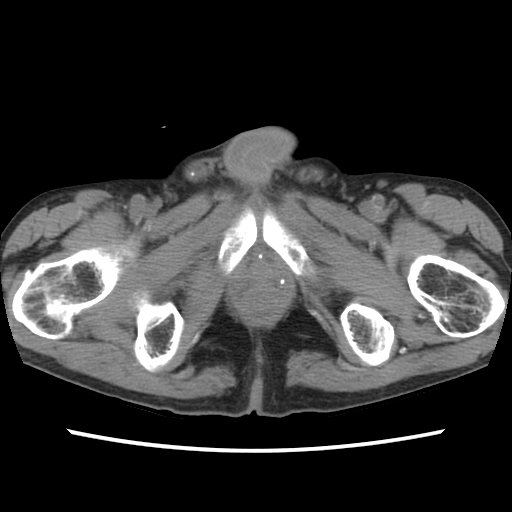
[im 16/80  soft-tissue]
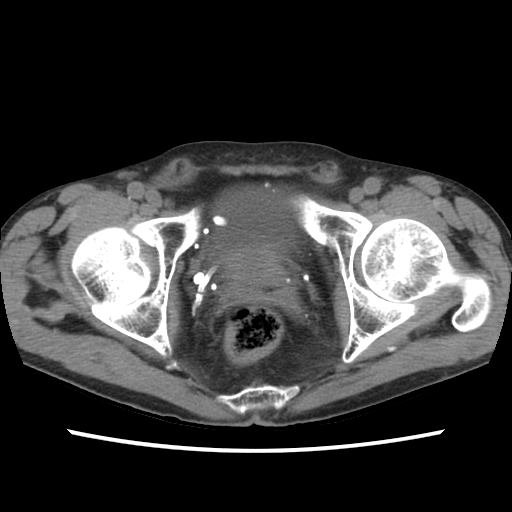
[im 22/80  soft-tissue]
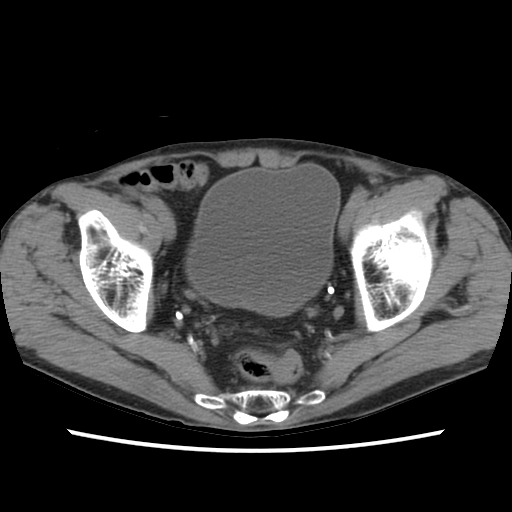
[im 28/80  soft-tissue]
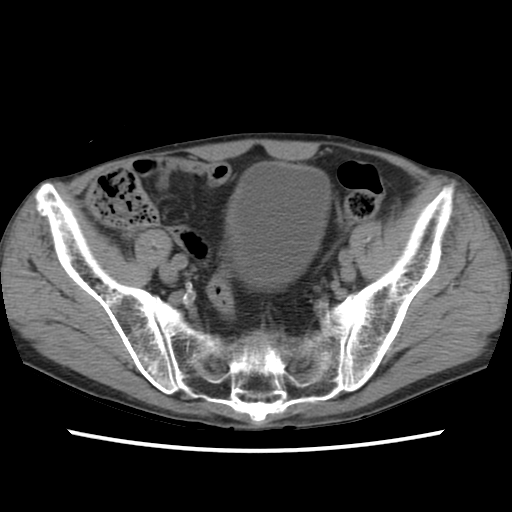
[im 34/80  soft-tissue]
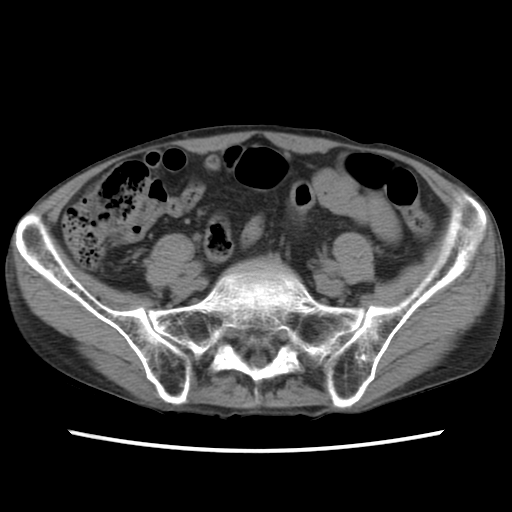
[im 40/80  soft-tissue]
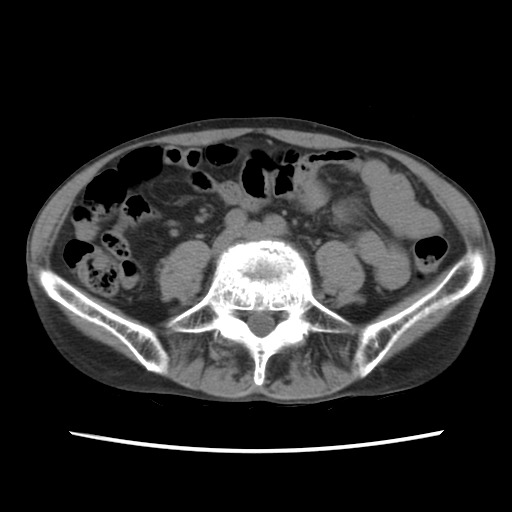
[im 46/80  soft-tissue]
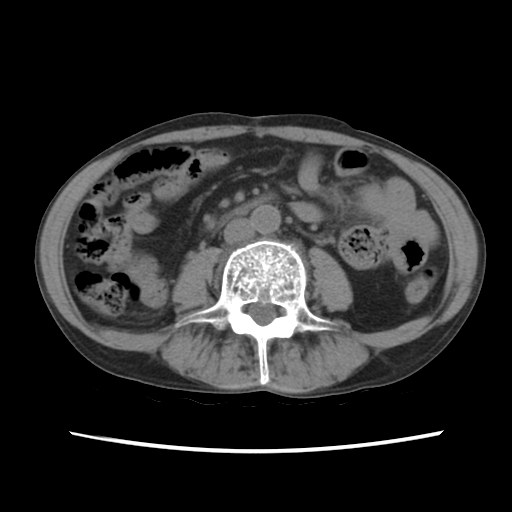
[im 52/80  soft-tissue]
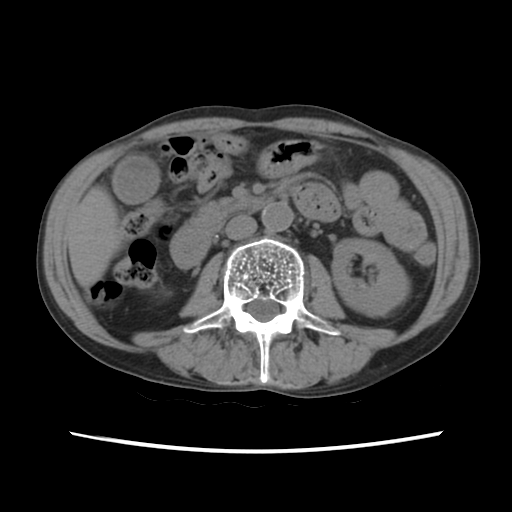
[im 52/80  bone]
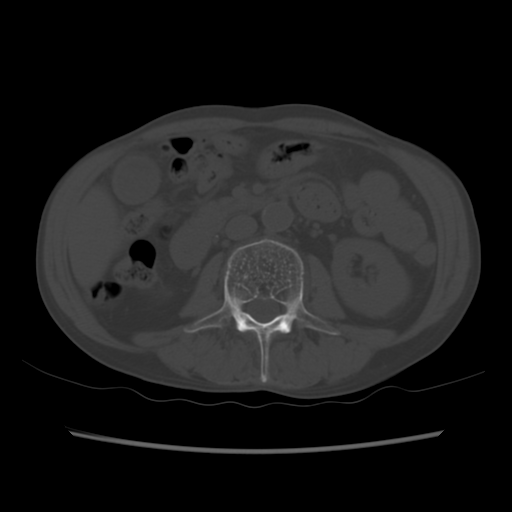
[im 58/80  soft-tissue]
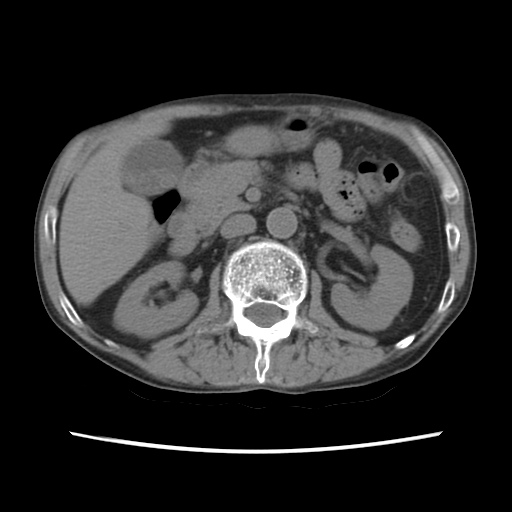
[im 64/80  soft-tissue]
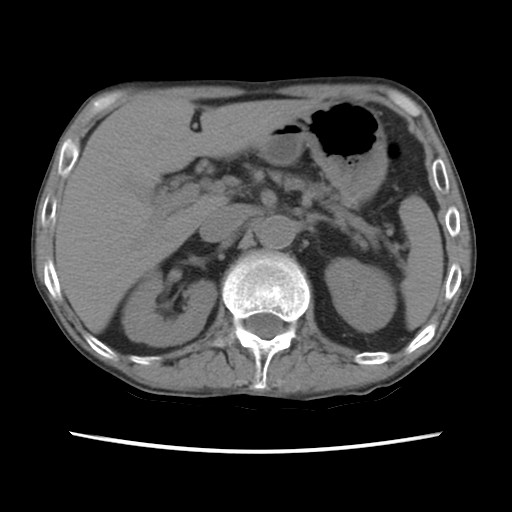
[im 70/80  soft-tissue]
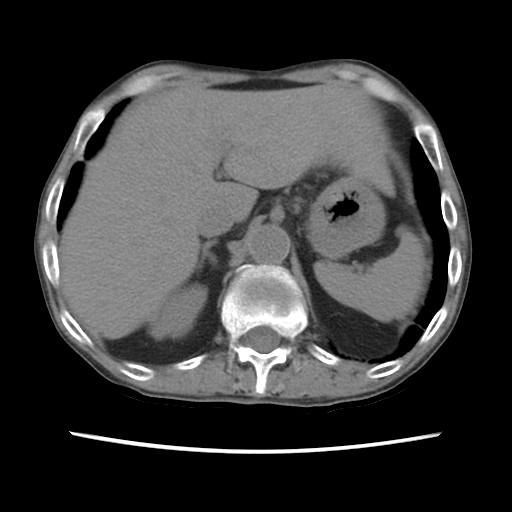
[im 76/80  soft-tissue]
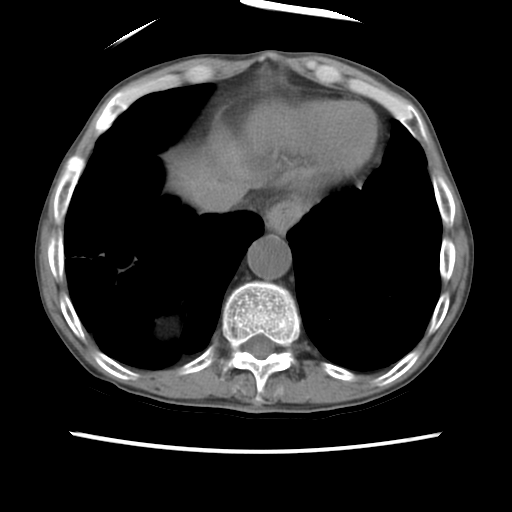

[Series 400: sagittal abd/pel · sagittal · 0.79mm/px · 3 of 88 slices shown]
[im 30/88  soft-tissue]
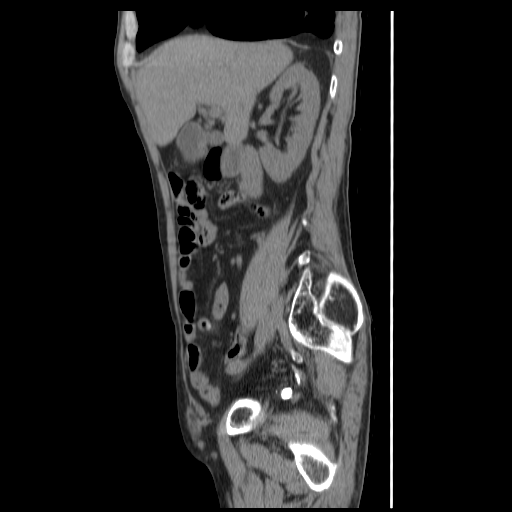
[im 39/88  soft-tissue]
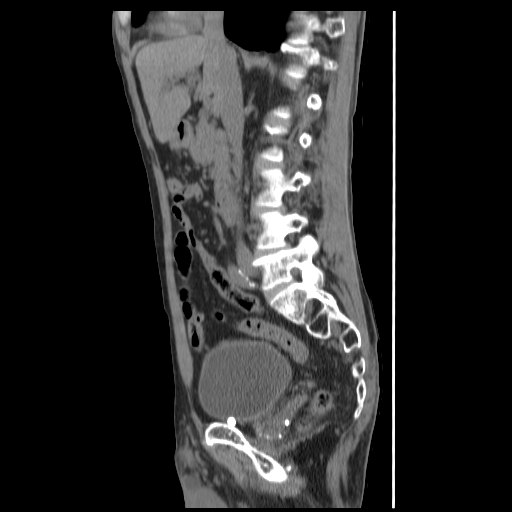
[im 49/88  soft-tissue]
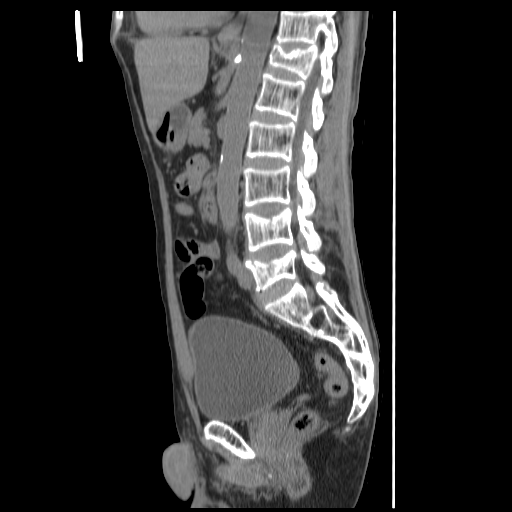

[16 of 46 positions shown; findings below may reference images not displayed]

FINDINGS: Sagittal images of the spine shows stable mild
compression fracture of superior endplate of T12 vertebral body.
Mild compression fracture noted superior endplate of L4 vertebral
body.  There is moderate compression fracture superior endplate of
L5 vertebral body.  This are probable old  and appear well
corticated.  Some vacuum disc phenomenon noted at L4-L5 level.

Lung bases shows linear scarring or atelectasis right base
posteriorly.  Unenhanced liver, spleen, pancreas and adrenals are
unremarkable.  Multiple noncalcified layering gallstones are noted
within gallbladder.

Unenhanced kidney are symmetrical in size.  No hydronephrosis or
hydroureter.  No nephrolithiasis.  No bowel obstruction.  No
ascites or free air.  No adenopathy.  Normal appendix is clearly
visualized in axial image 49.  The urinary bladder is unremarkable.
There is enlargement of the prostate gland with polypoid
indentation of urinary bladder base.  Prostate gland measures 4.4 x
2.3 cm.  This is best seen in the sagittal image 44.  Correlation
with urology exam and cystoscopy is suggested as clinically
warranted.

Atherosclerotic calcifications of the iliac arteries are noted.  No
pelvic ascites or adenopathy.  No destructive bony lesions are
noted within pelvis.  Bilateral distal ureter is unremarkable.
Multiple pelvic phleboliths are noted.
IMPRESSION: 1.  No nephrolithiasis.  No hydronephrosis or hydroureter.
2.  Stable chronic compression fracture of superior endplate of T12
vertebral body.  Probable chronic compression fractures of  L4 and
L5 vertebral body.
3.  Normal appendix.
4.  Multiple noncalcified gallstones are noted within gallbladder.
5.  Mild enlargement of the prostate gland with polypoid
indentation of urinary bladder base.  Correlation with urology exam
and possible cystoscopy is suggested.

## 2009-12-02 ENCOUNTER — Emergency Department (HOSPITAL_COMMUNITY): Admission: EM | Admit: 2009-12-02 | Discharge: 2009-12-02 | Payer: Self-pay | Admitting: Emergency Medicine

## 2010-02-07 ENCOUNTER — Emergency Department (HOSPITAL_COMMUNITY): Admission: EM | Admit: 2010-02-07 | Discharge: 2010-02-07 | Payer: Self-pay | Admitting: Emergency Medicine

## 2010-02-07 IMAGING — CT CT HEAD W/O CM
1 of 2 series · 13 of 30 positions shown, 17 images · non-contrast
Comparison: None

CLINICAL DATA: Headache on the top of the head.  Symptoms began on
[REDACTED].  No prior head history.  History of smoking and drinking.

CT HEAD WITHOUT CONTRAST
TECHNIQUE: Contiguous axial images were obtained from the base of
the skull through the vertex without contrast.

[Series 2: brain · axial · 0.47mm/px · z∈[+152,+274]mm · 13 of 28 slices shown, 17 images]
[im 2/28  brain]
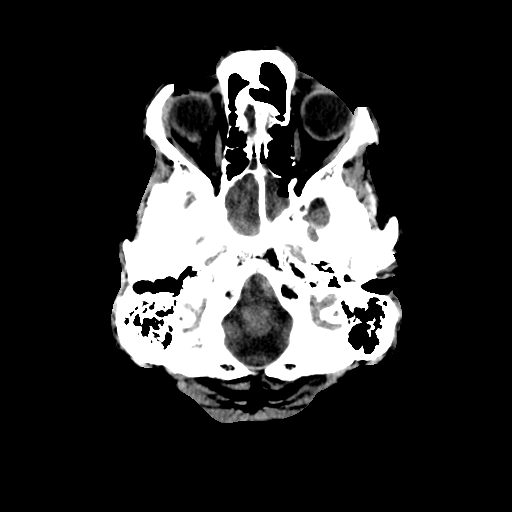
[im 2/28  bone]
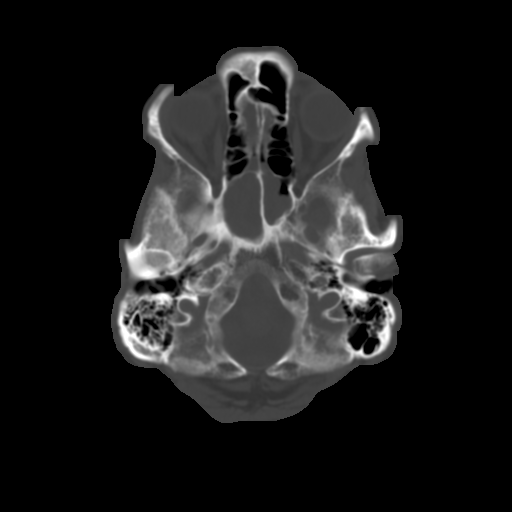
[im 4/28  brain]
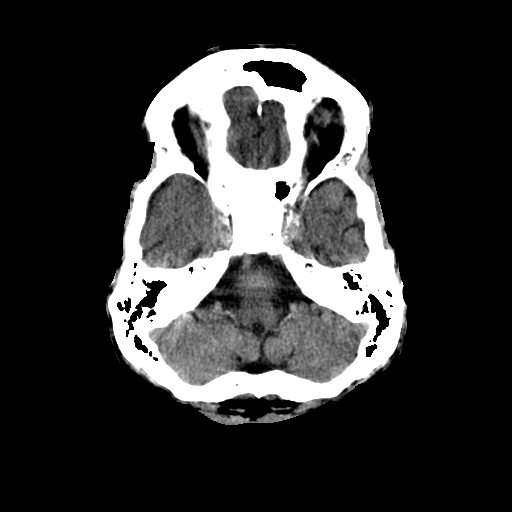
[im 6/28  brain]
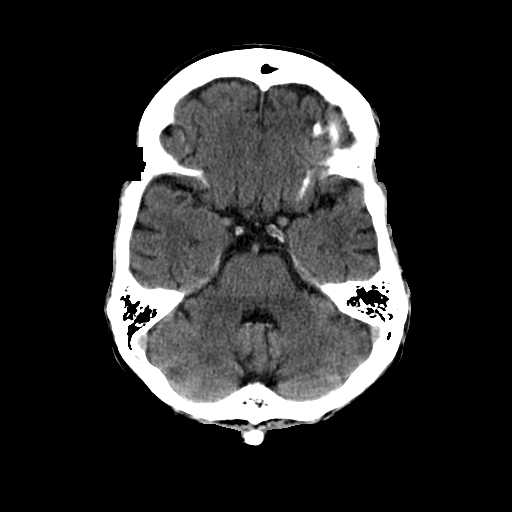
[im 8/28  brain]
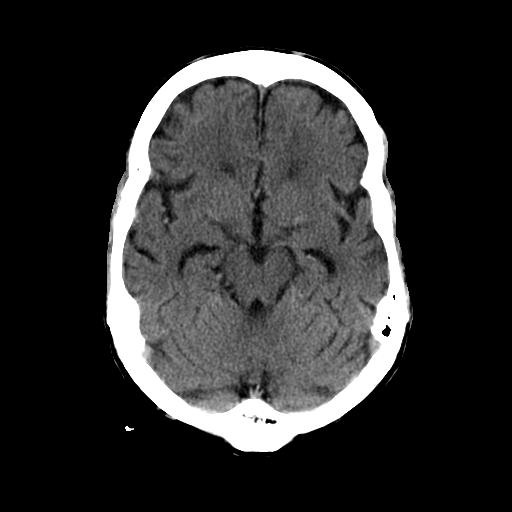
[im 10/28  brain]
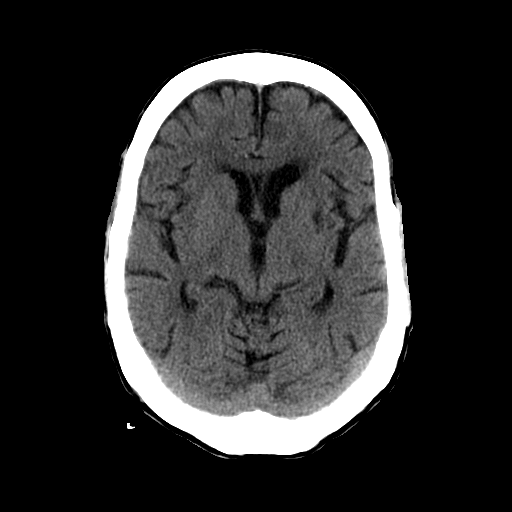
[im 10/28  bone]
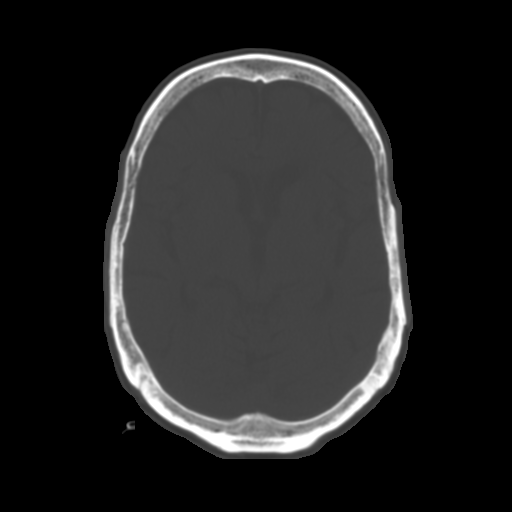
[im 12/28  brain]
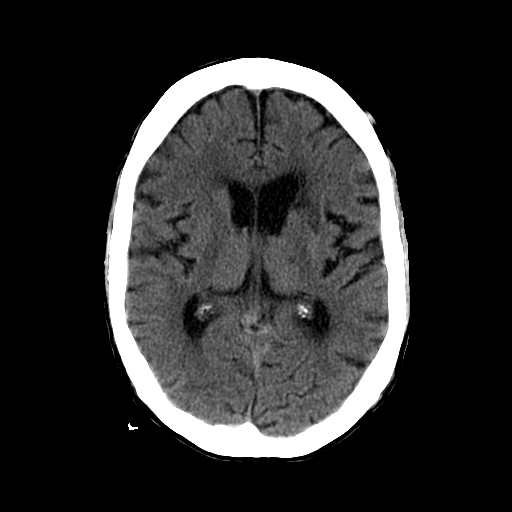
[im 14/28  brain]
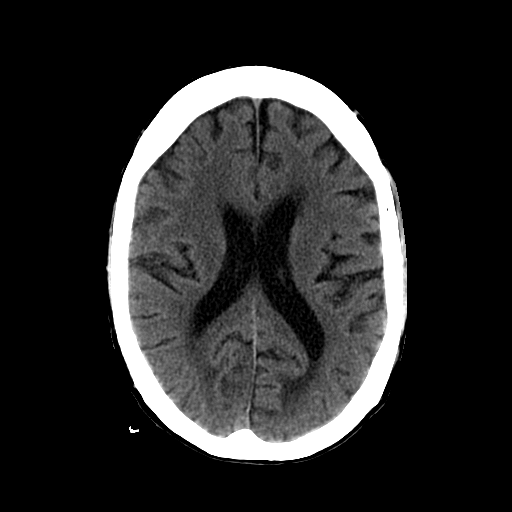
[im 16/28  brain]
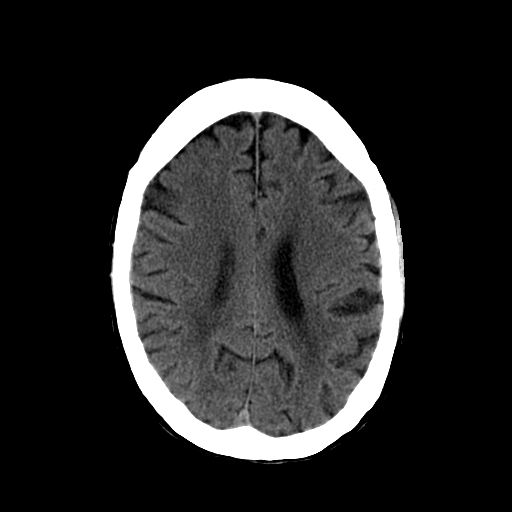
[im 18/28  brain]
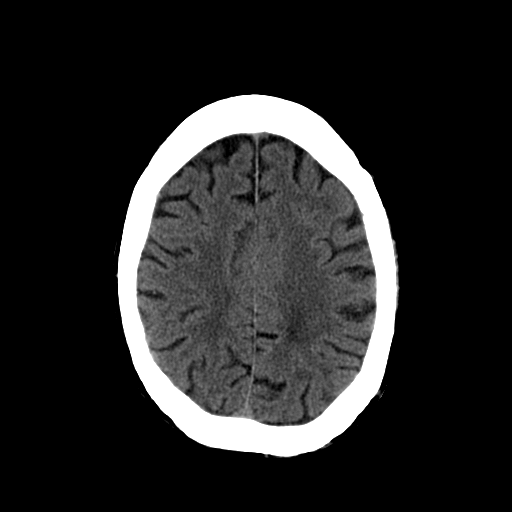
[im 18/28  bone]
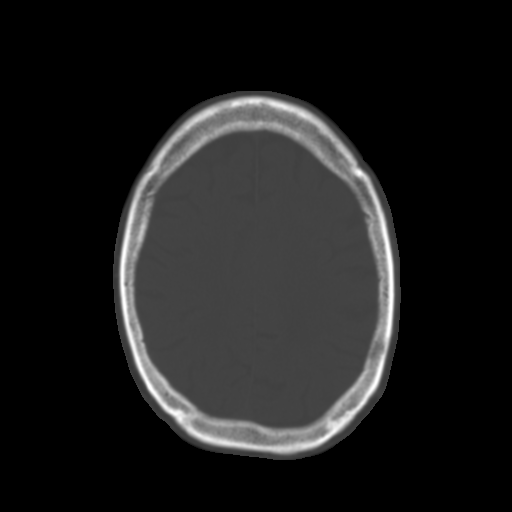
[im 20/28  brain]
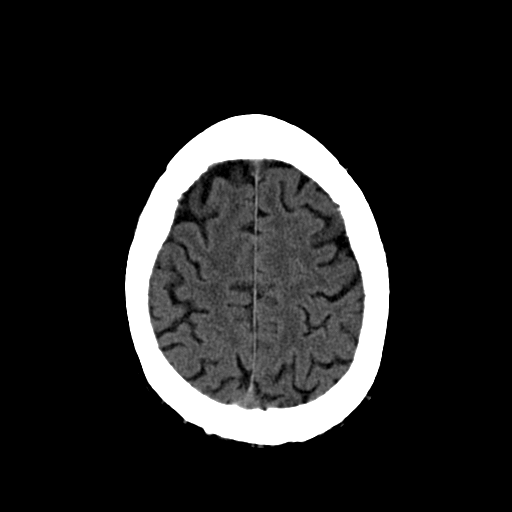
[im 22/28  brain]
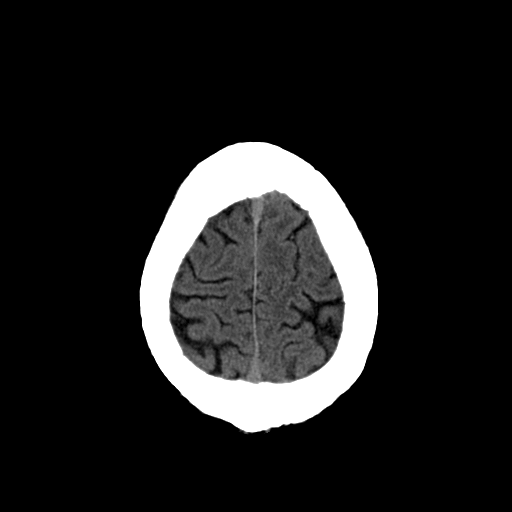
[im 24/28  brain]
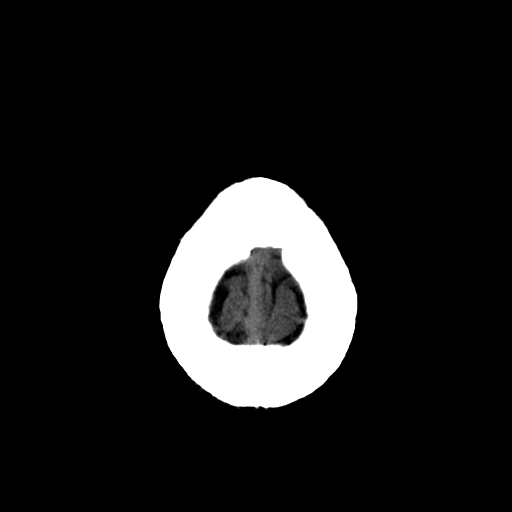
[im 26/28  brain]
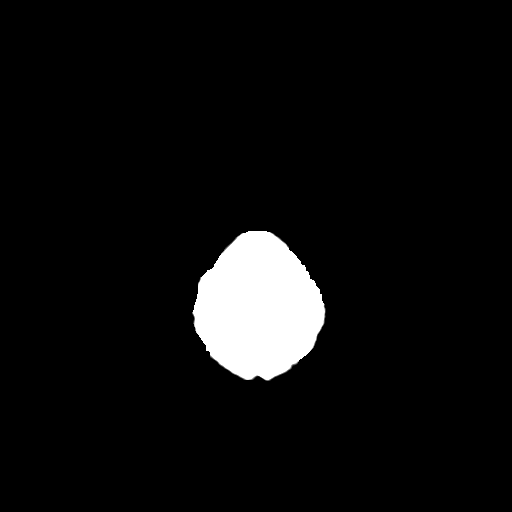
[im 26/28  bone]
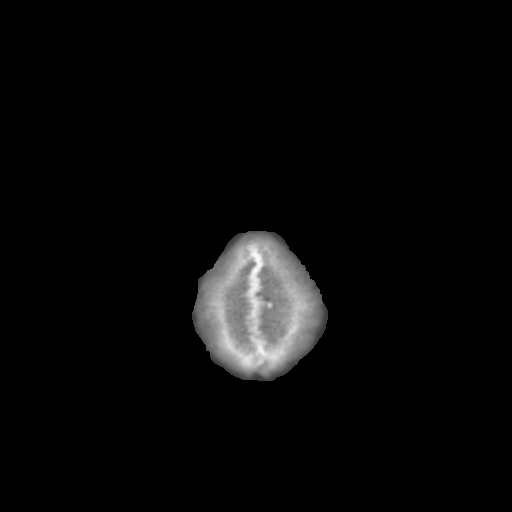

[13 of 30 positions shown; findings below may reference images not displayed]

FINDINGS: There is moderate central and cortical atrophy.
Periventricular white matter changes are consistent with small
vessel disease.  There is an old infarct of the left putamen and
internal capsule.  There is no evidence for hemorrhage, mass
lesion, or acute infarction.

Bone windows show opacification of the posterior ethmoid sinuses
and sphenoid air cells.  There is atherosclerotic calcification of
the intracranial portion of the internal carotid arteries.  No
evidence for calvarial fracture.
IMPRESSION: 1.  Atrophy and small vessel disease.
2.  Old left basal ganglia infarct.
3.  Sphenoid sinusitis possibly accounting for the patient's
symptoms.

## 2010-02-22 ENCOUNTER — Emergency Department (HOSPITAL_COMMUNITY): Admission: EM | Admit: 2010-02-22 | Discharge: 2010-02-23 | Payer: Self-pay | Admitting: Emergency Medicine

## 2010-02-22 ENCOUNTER — Ambulatory Visit: Payer: Self-pay | Admitting: Psychiatry

## 2010-02-22 IMAGING — CR DG CHEST 1V PORT
1 series · 1 of 1 positions shown · non-contrast
Comparison: [DATE]

CLINICAL DATA: Weakness and pain

PORTABLE CHEST - 1 VIEW

[view not recorded]
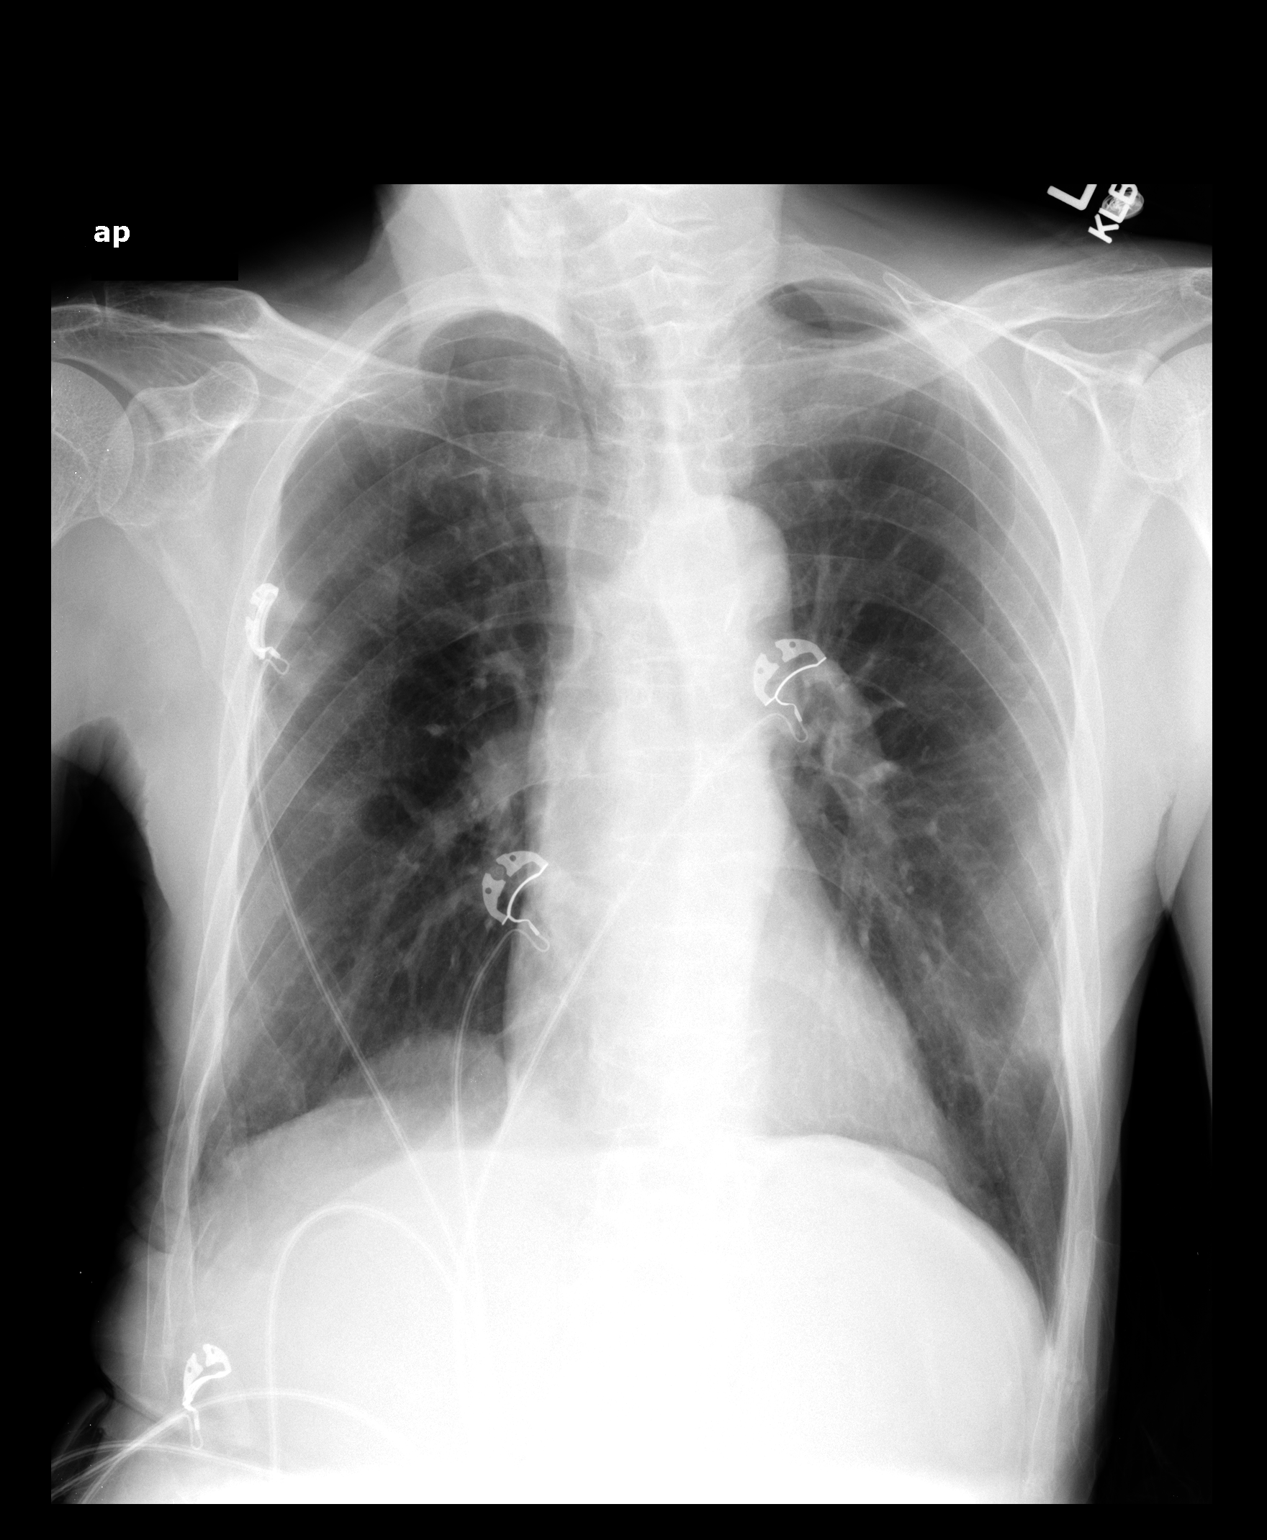

[1 of 1 positions shown; findings below may reference images not displayed]

FINDINGS: Hazy opacity at the left base laterally is present.
Nodular densities in the right upper lobe are noted peripherally.
Lungs are hyperaerated.  Heart is mildly enlarged.  No
pneumothorax.
IMPRESSION: Hazy density at the left base.  This may reflect overlying soft
tissue.  Dedicated upright PA and lateral chest film is recommended
per

Nodular densities in the right upper lung zone are noted.  This may
reflect overlying objects.  PA without overlying objects is
recommended.

## 2010-02-22 IMAGING — CR DG CHEST 2V
2 series · 2 of 2 positions shown · non-contrast
Comparison: Portable exam earlier today. Two-view chest [DATE]

CLINICAL DATA: Weakness.  Pain all over.

CHEST - 2 VIEW

[view not recorded (1 of 2)]
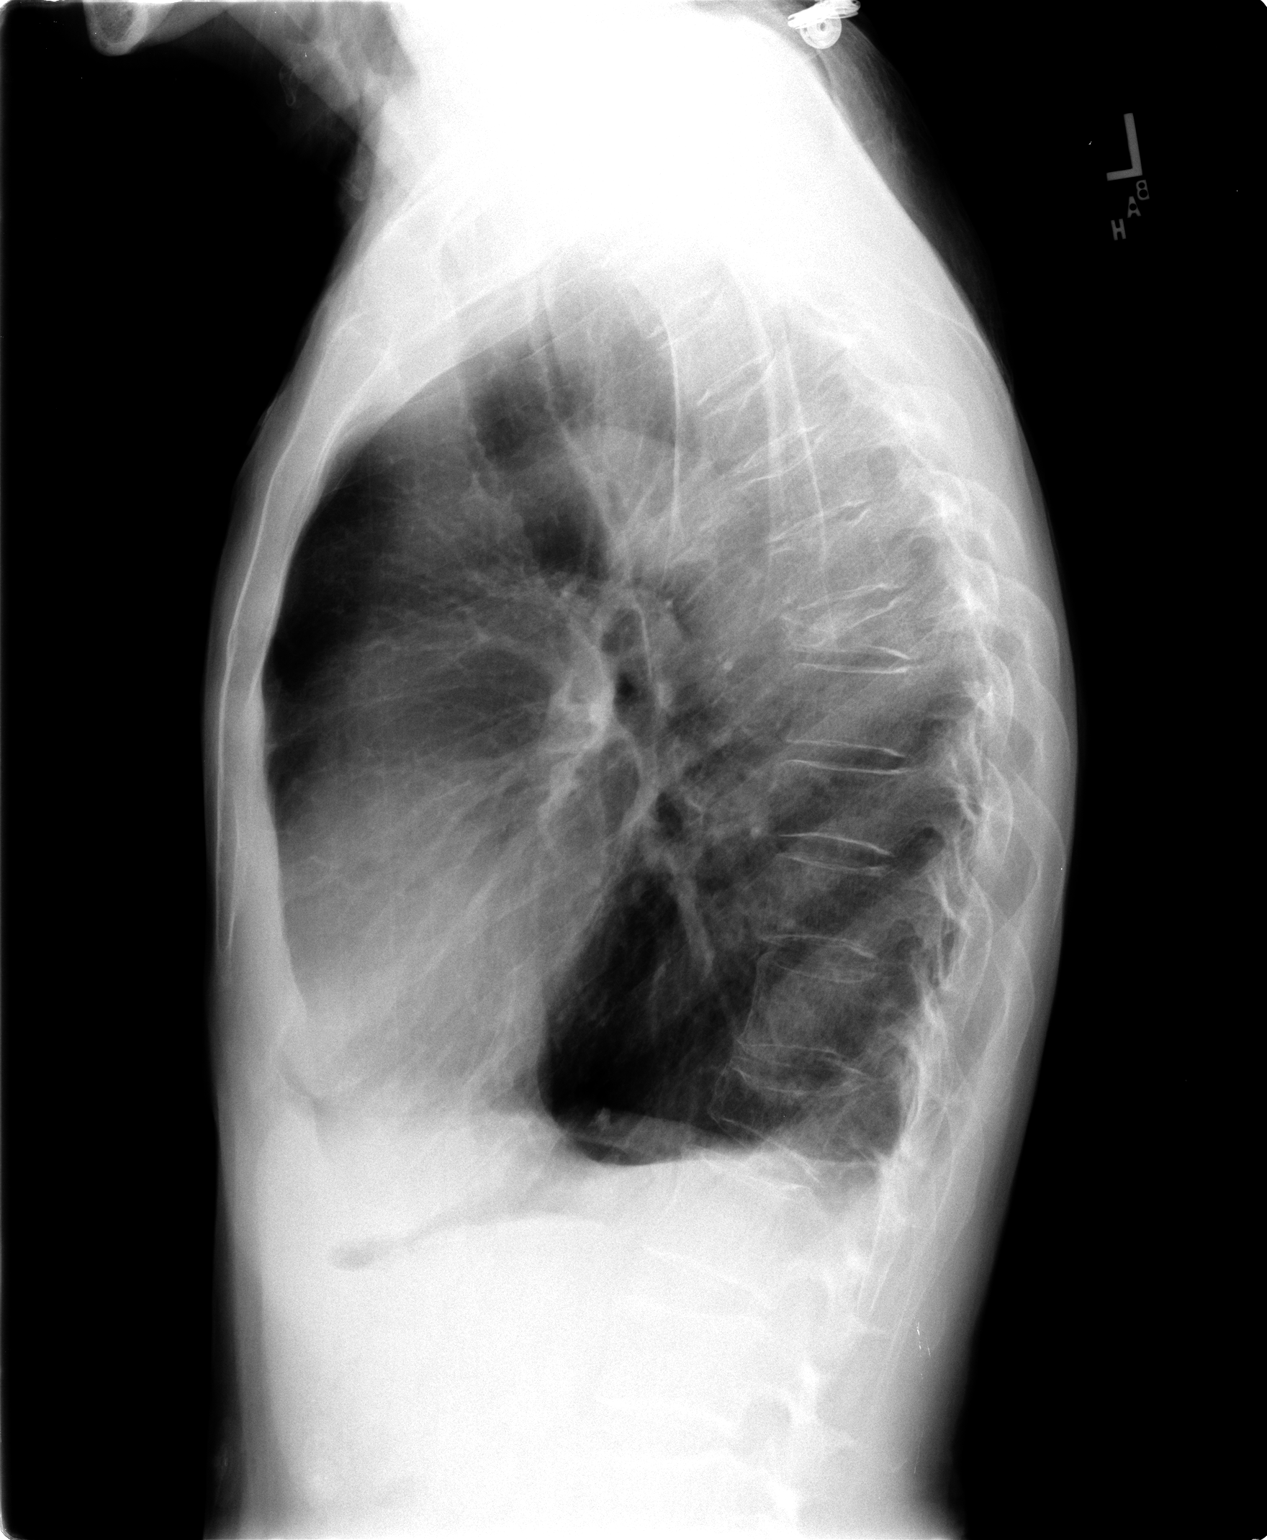

[view not recorded (2 of 2)]
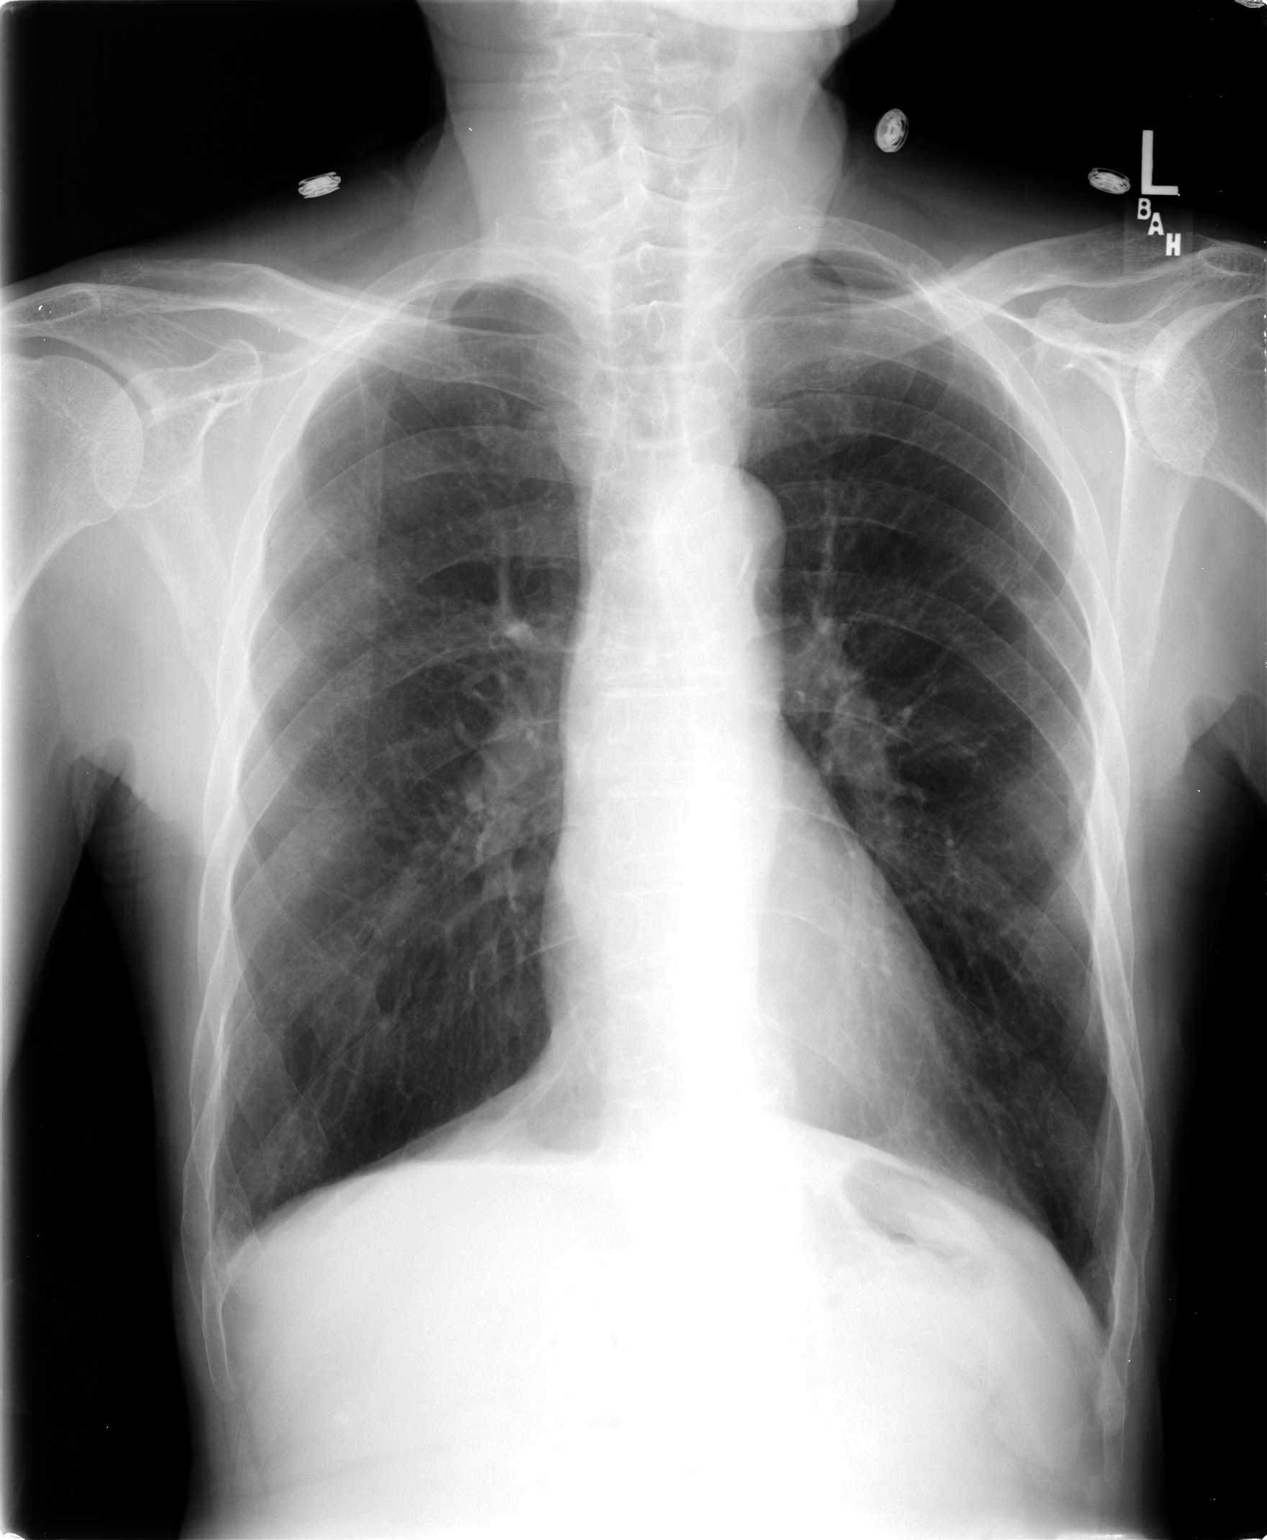

[2 of 2 positions shown; findings below may reference images not displayed]

FINDINGS: Lungs are clear.  COPD/emphysema.  Normal
cardiomediastinal silhouette size and contours.  Stable appearance
of the T7 and T12 compression fractures.
IMPRESSION: No evidence of a nodular densities in the right upper lobe or
abnormal density in the left lower lung zone as questioned on
portable exam earlier today.

COPD/emphysema.

## 2010-03-03 ENCOUNTER — Emergency Department (HOSPITAL_COMMUNITY): Admission: EM | Admit: 2010-03-03 | Discharge: 2010-03-03 | Payer: Self-pay | Admitting: Emergency Medicine

## 2010-03-05 ENCOUNTER — Emergency Department (HOSPITAL_COMMUNITY): Admission: EM | Admit: 2010-03-05 | Discharge: 2010-03-05 | Payer: Self-pay | Admitting: Emergency Medicine

## 2010-03-05 IMAGING — CR DG CHEST 2V
2 series · 2 of 2 positions shown · non-contrast
Comparison: [DATE]

CLINICAL DATA: Right-sided abdominal pain.  Weakness.

CHEST - 2 VIEW

[w chest pa]
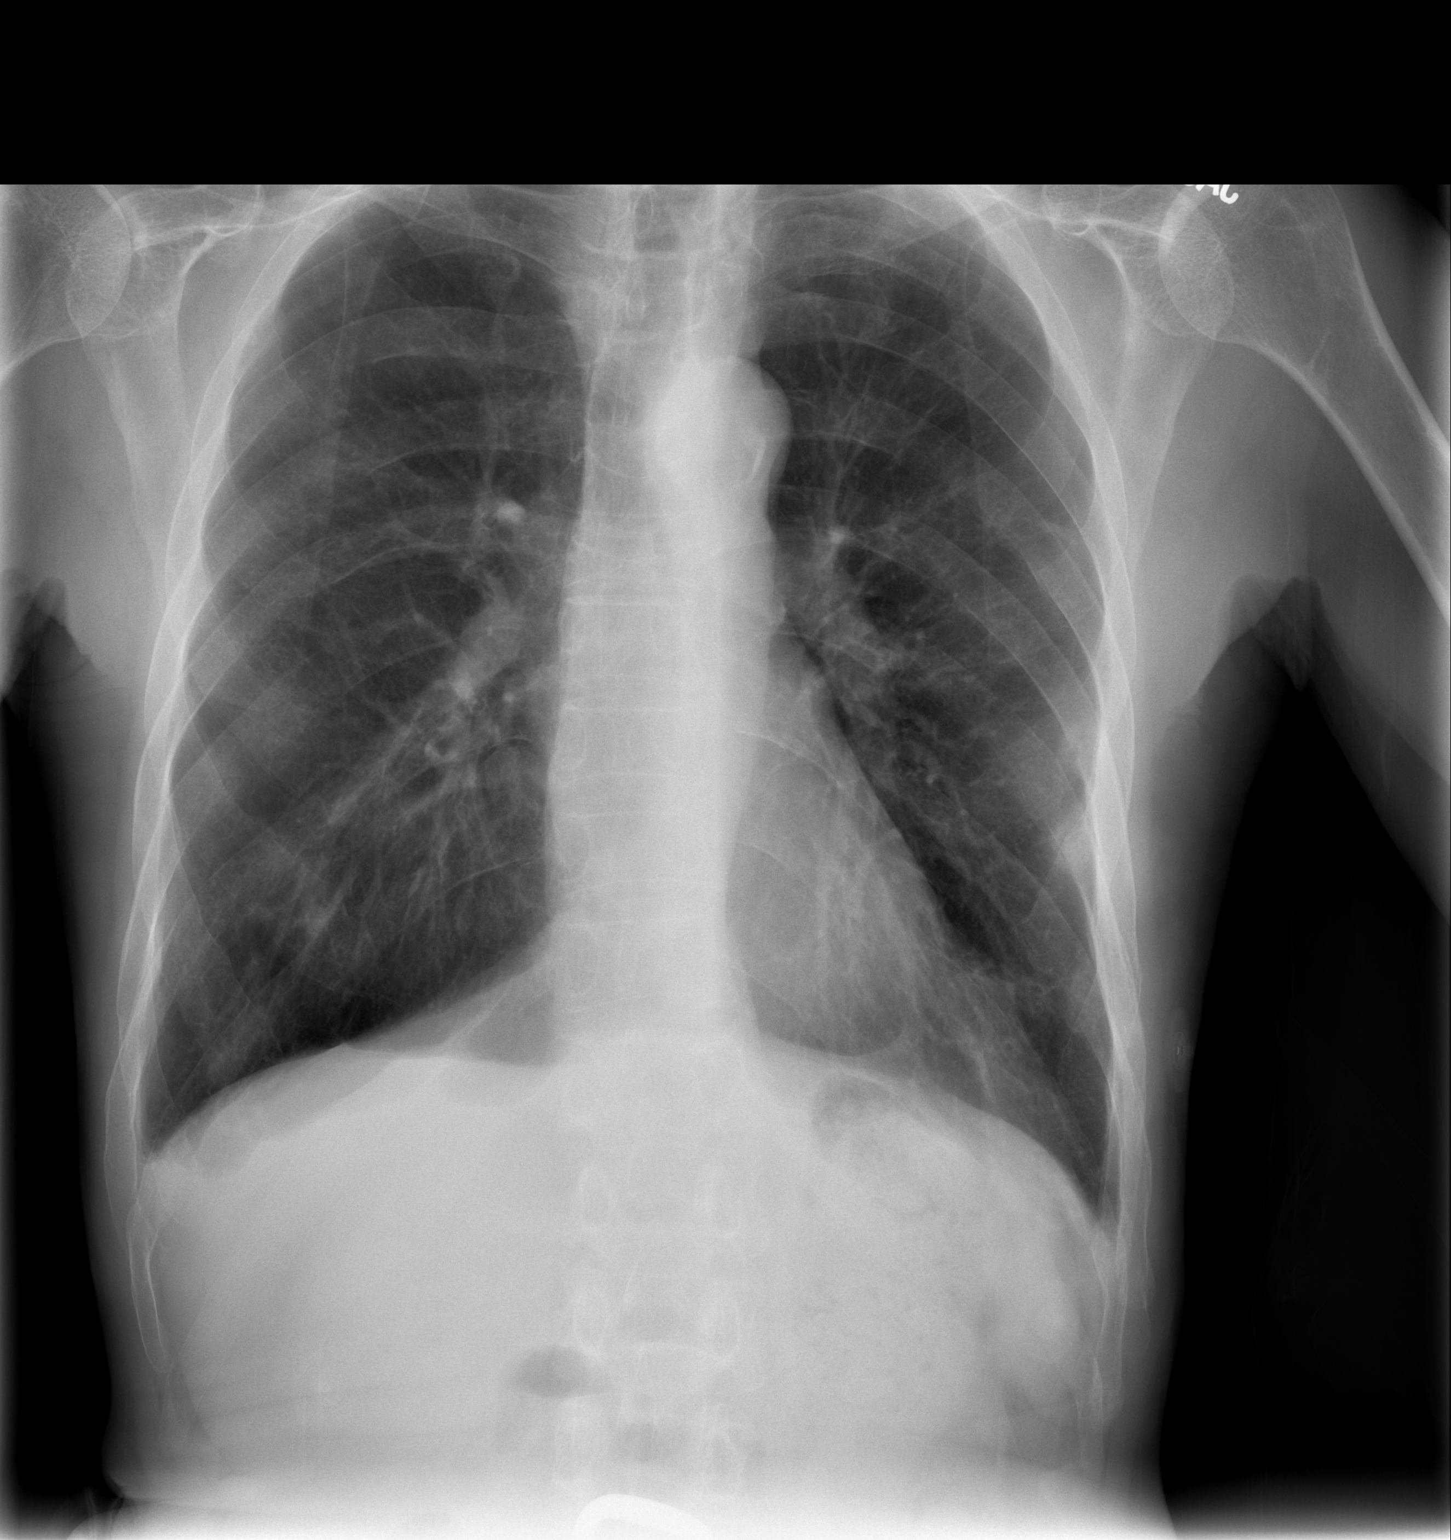

[w chest lat]
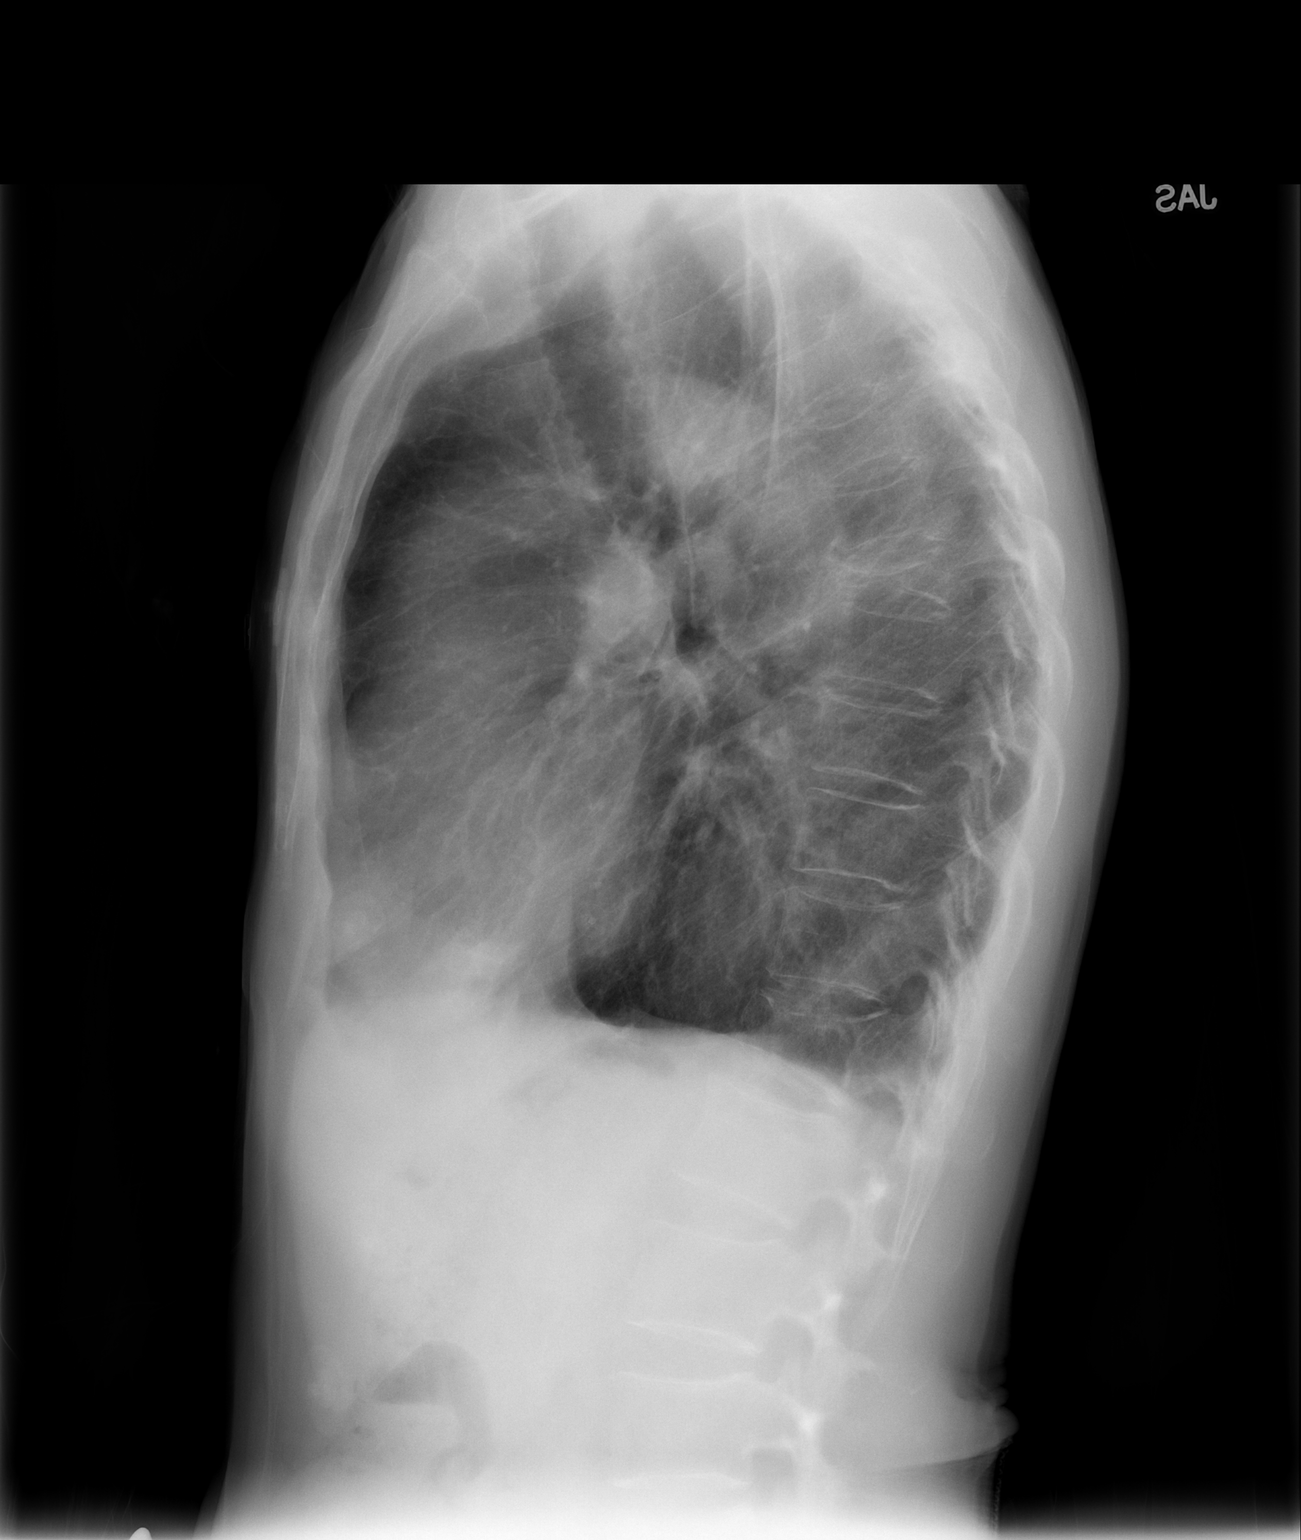

[2 of 2 positions shown; findings below may reference images not displayed]

FINDINGS: Stable emphysema noted. Cardiac and mediastinal contours
appear unremarkable.

Peripheral densities in the left mid lung are again noted.  These
probably represent callus formation related to rib fractures
although pulmonary nodules cannot be excluded.  Linear opacities in
both lung bases are noted, left greater than right, suspicious for
subsegmental atelectasis or early bronchopneumonia.

Thoracolumbar compression fractures appear unchanged.  Old
bilateral rib fractures noted.
IMPRESSION: 1.  Emphysema.
2.  Nodularity peripherally in the left lung which is nonspecific
but may be related to prior left rib fractures, and also with
evidence of remote right rib fractures.
3.  Unchanged appearance of thoracolumbar compression fractures.
4.  Linear opacities favoring left lung base potentially reflecting
atelectasis or early bronchopneumonia.

## 2010-03-07 ENCOUNTER — Emergency Department (HOSPITAL_COMMUNITY): Admission: EM | Admit: 2010-03-07 | Discharge: 2010-03-08 | Payer: Self-pay | Admitting: Emergency Medicine

## 2010-03-08 IMAGING — CT CT HEAD W/O CM
4 of 5 series · 17 of 47 positions shown, 18 images · non-contrast
Comparison: CT of the head performed [DATE]

CT HEAD

CLINICAL DATA: Status post fall, with abrasion on left forehead.
Concern for cervical spine injury.

CT HEAD WITHOUT CONTRAST AND CT CERVICAL SPINE WITHOUT CONTRAST
TECHNIQUE: Multidetector CT imaging of the head and cervical spine
was performed following the standard protocol without intravenous
contrast.  Multiplanar CT image reconstructions of the cervical
spine were also generated.

[Series 3: head trauma 4.8 h37s · axial · 0.45mm/px · z∈[-146,-57]mm · 3 of 36 slices shown, 4 images]
[im 9/36  brain]
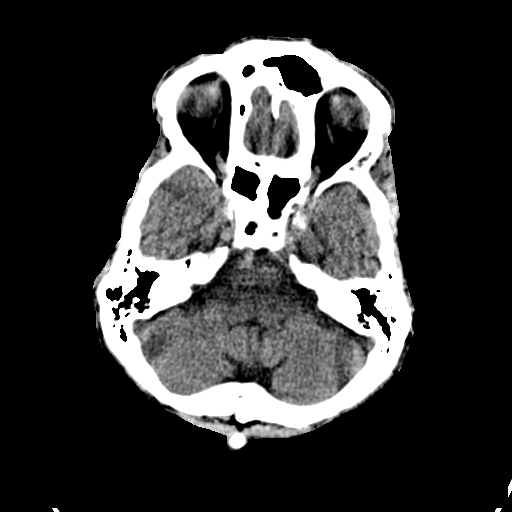
[im 9/36  bone]
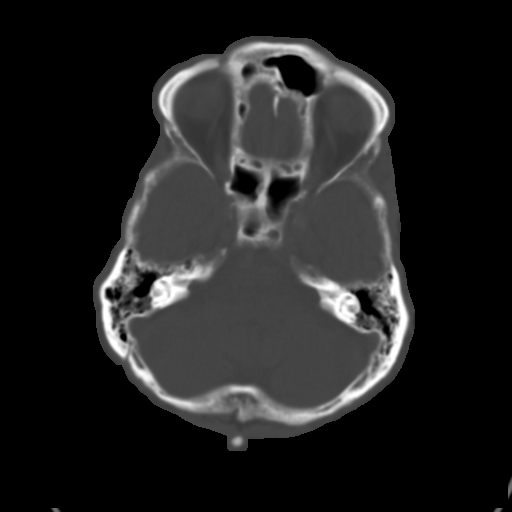
[im 18/36  brain]
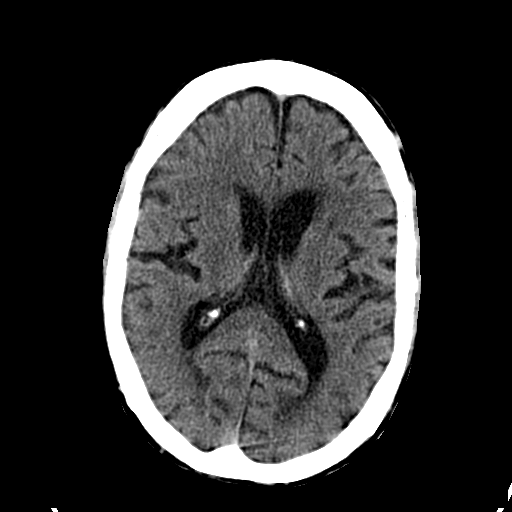
[im 27/36  brain]
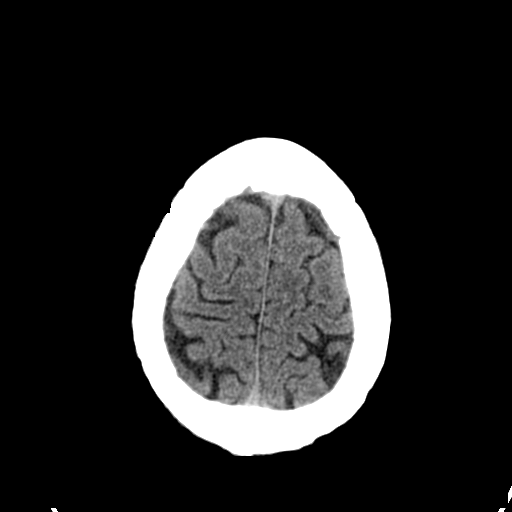

[Series 8: c_spine 2.0 spo thins · coronal · 0.43mm/px · 3 of 51 slices shown (1 of 3)]
[im 17/51  brain]
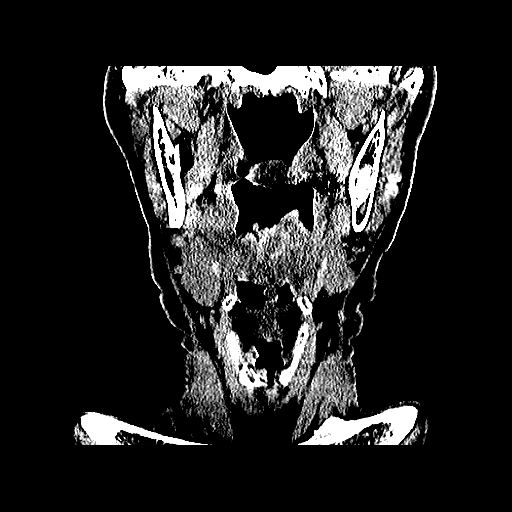
[im 23/51  brain]
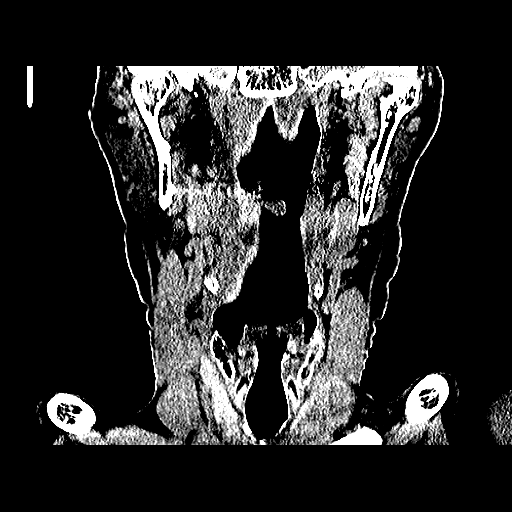
[im 28/51  brain]
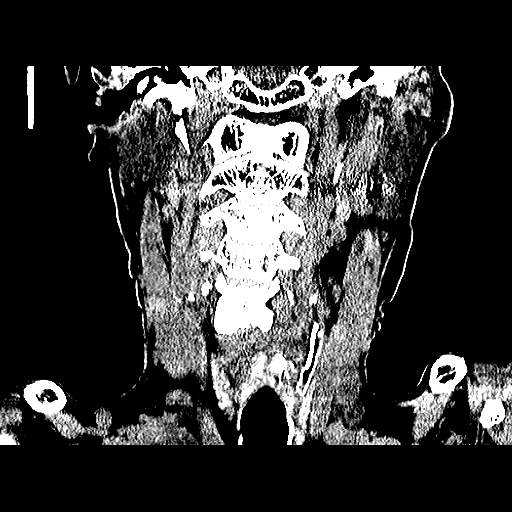

[Series 9: c_spine 2.0 spo thins · sagittal · 0.43mm/px · 3 of 53 slices shown (2 of 3)]
[im 18/53  brain]
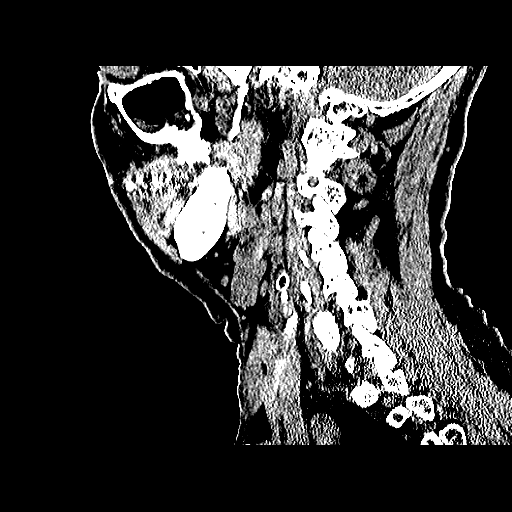
[im 27/53  brain]
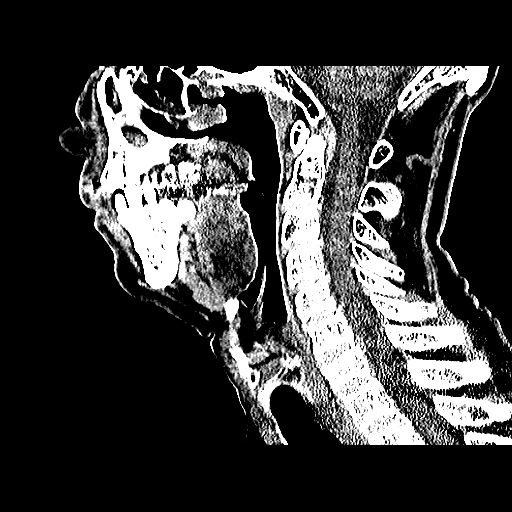
[im 35/53  brain]
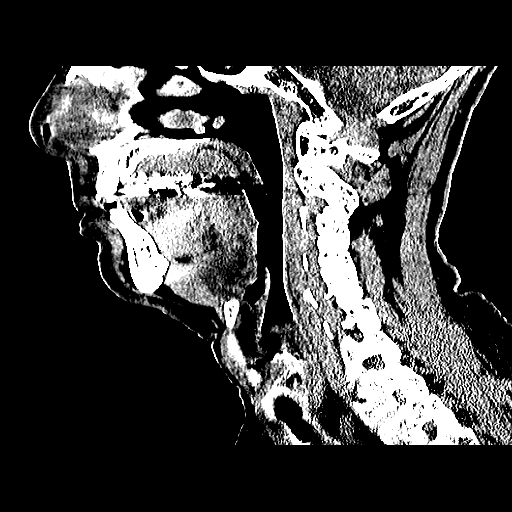

[Series 10: c_spine 2.0 spo thins · axial · 0.43mm/px · z∈[-326,-200]mm · 8 of 77 slices shown (3 of 3)]
[im 7/77  brain]
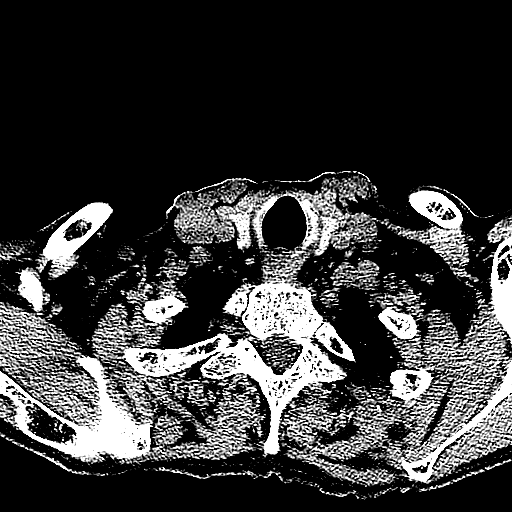
[im 14/77  brain]
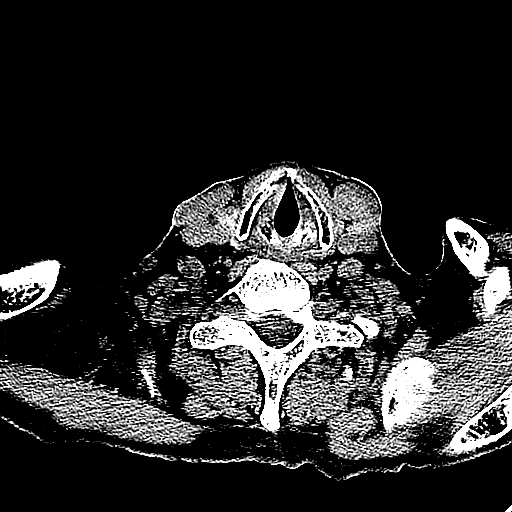
[im 28/77  brain]
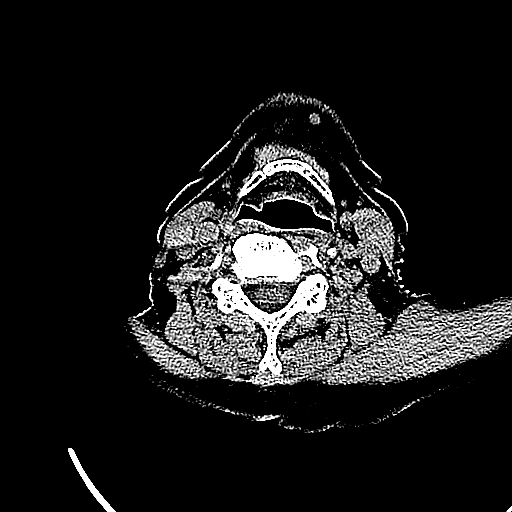
[im 35/77  brain]
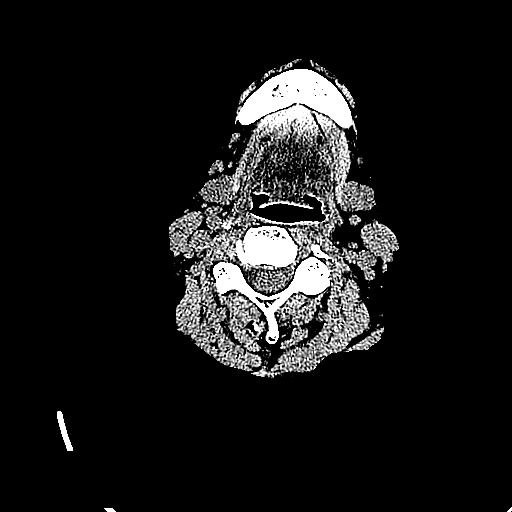
[im 42/77  brain]
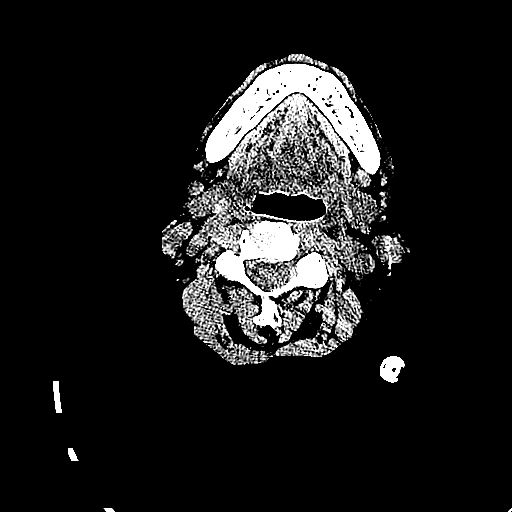
[im 49/77  brain]
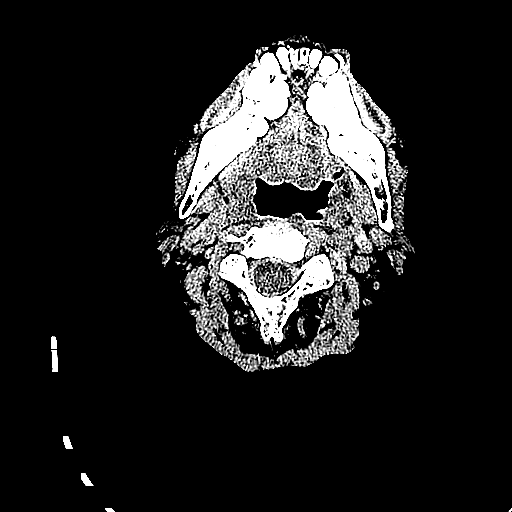
[im 63/77  brain]
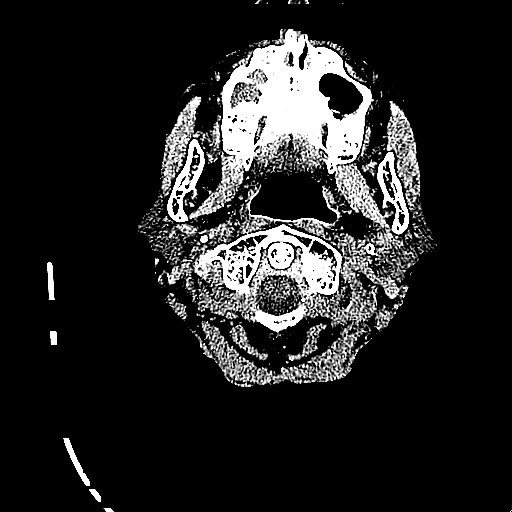
[im 70/77  brain]
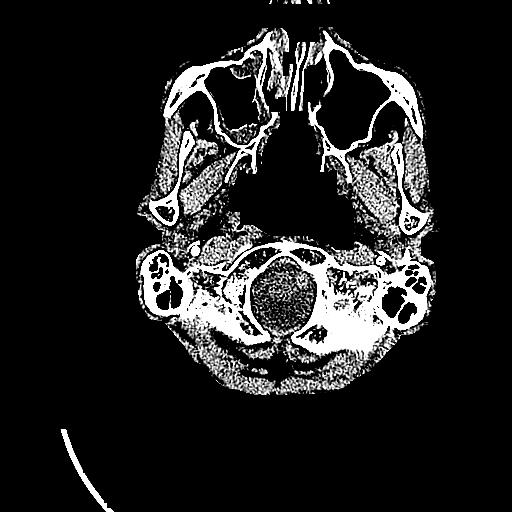

[17 of 47 positions shown; findings below may reference images not displayed]

FINDINGS: There is no evidence of acute infarction, mass lesion, or
intra- or extra-axial hemorrhage on CT.

Scattered periventricular and subcortical white matter change
likely reflects small vessel ischemic microangiopathy.  A chronic
infarct is noted along the left external capsule and putamen,
extending to the left caudate.

The posterior fossa, including the cerebellum, brainstem and fourth
ventricle, is within normal limits.  The third and lateral
ventricles are unremarkable in appearance.  The cerebral
hemispheres are symmetric in appearance, with normal gray-white
differentiation.  No mass effect or midline shift is seen.

There is no evidence of fracture; visualized osseous structures are
unremarkable in appearance.  The orbits are within normal limits.
Low attenuation fluid is noted in the right maxillary sinus,
without evidence of associated fracture.  A mucus retention cyst or
polyp is noted within the left sphenoid sinus.  The remaining
paranasal sinuses and mastoid air cells are well-aerated.

Mild soft tissue stranding is noted overlying the posterior
parietal calvarium; there is mild soft tissue swelling overlying
the left frontal calvarium, superior to the left orbit. Cerumen is
noted within both external auditory canals, right greater than
left; within the left external auditory canal, this might be a
small skin polyp.
IMPRESSION: 1.  No evidence of traumatic intracranial injury or fracture.
2.  Mild soft tissue swelling overlying the left frontal calvarium,
superior to the left orbit; mild soft tissue stranding overlying
the posterior parietal calvarium.
3.  Low attenuation fluid in the right maxillary sinus; mucus
retention cyst or polyp noted at the left sphenoid sinus.
4.  Chronic left basal ganglia infarct and scattered small vessel
ischemic microangiopathy.
5.  Cerumen noted within both external auditory canals, right
greater than left; within the left external auditory canal, this
might be a small skin polyp.

CT CERVICAL SPINE
FINDINGS: There is no evidence of fracture or subluxation.
Vertebral bodies demonstrate normal height and alignment.
Intervertebral disc spaces are preserved.  Prevertebral soft
tissues are within normal limits.  Prominent anterior osteophytes
are noted at C5-C6.

The thyroid gland is unremarkable in appearance.  The visualized
lung apices are clear.  No significant soft tissue abnormalities
are seen.
IMPRESSION: No evidence of fracture or subluxation along the cervical spine.

## 2010-05-01 ENCOUNTER — Emergency Department (HOSPITAL_COMMUNITY): Admission: EM | Admit: 2010-05-01 | Discharge: 2010-05-01 | Payer: Self-pay | Admitting: Emergency Medicine

## 2010-05-01 ENCOUNTER — Emergency Department (HOSPITAL_COMMUNITY): Admission: EM | Admit: 2010-05-01 | Discharge: 2010-05-02 | Payer: Self-pay | Admitting: Emergency Medicine

## 2010-05-01 IMAGING — CR DG ABDOMEN ACUTE W/ 1V CHEST
3 series · 3 of 3 positions shown · non-contrast
Comparison: None.

CLINICAL DATA: Abdominal pain

ACUTE ABDOMEN SERIES (ABDOMEN 2 VIEW & CHEST 1 VIEW)

[w chest pa]
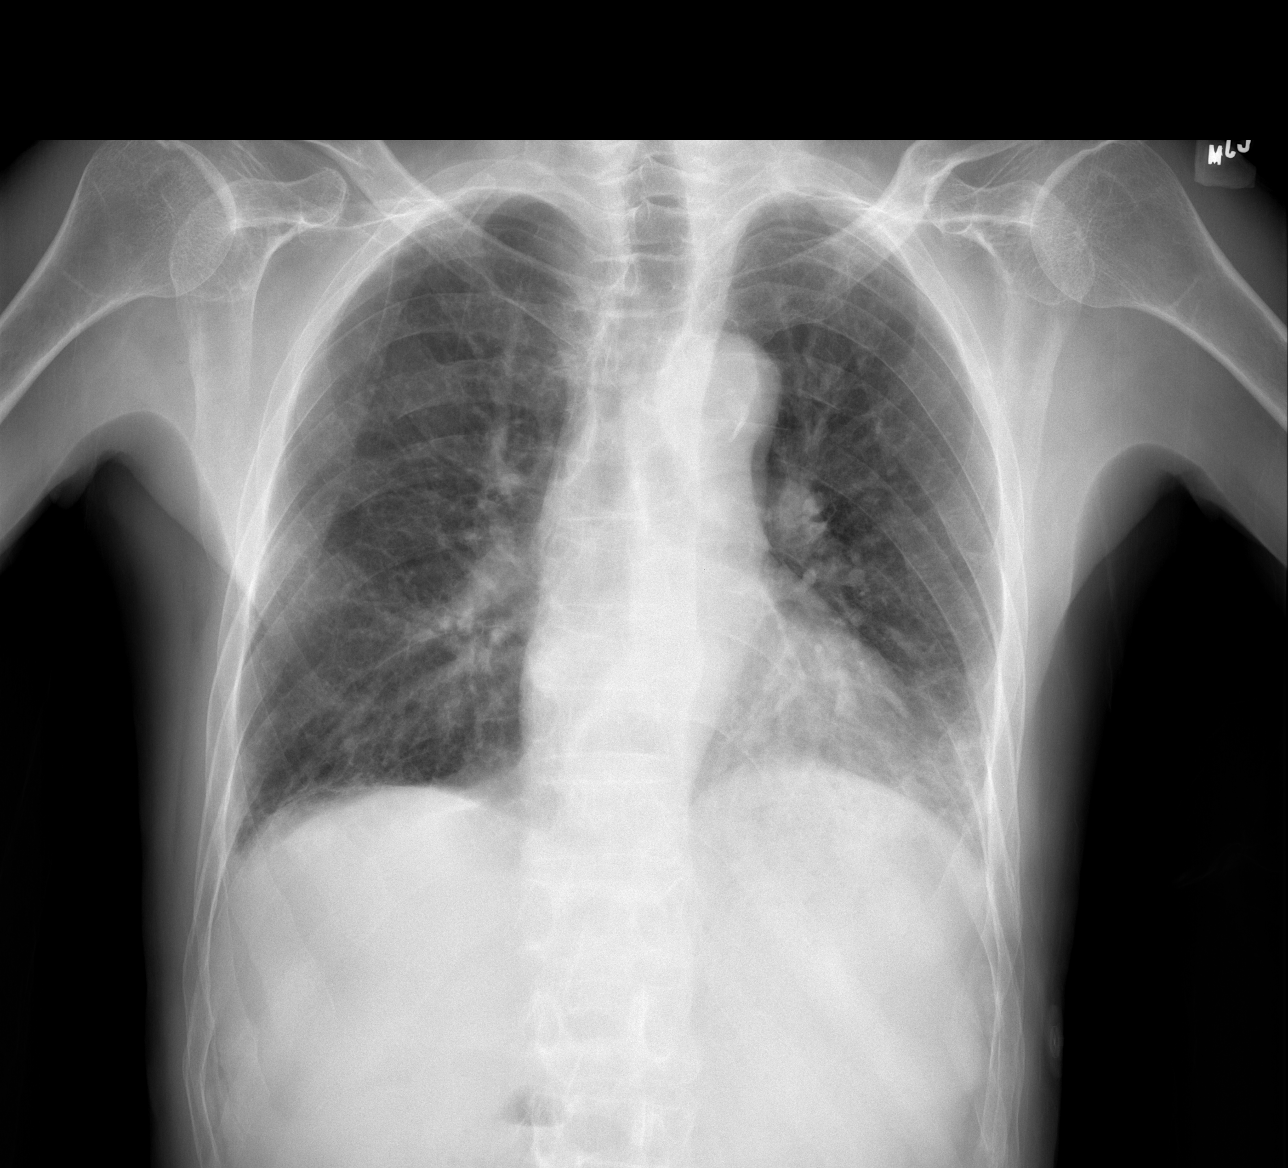

[w abdomen upright]
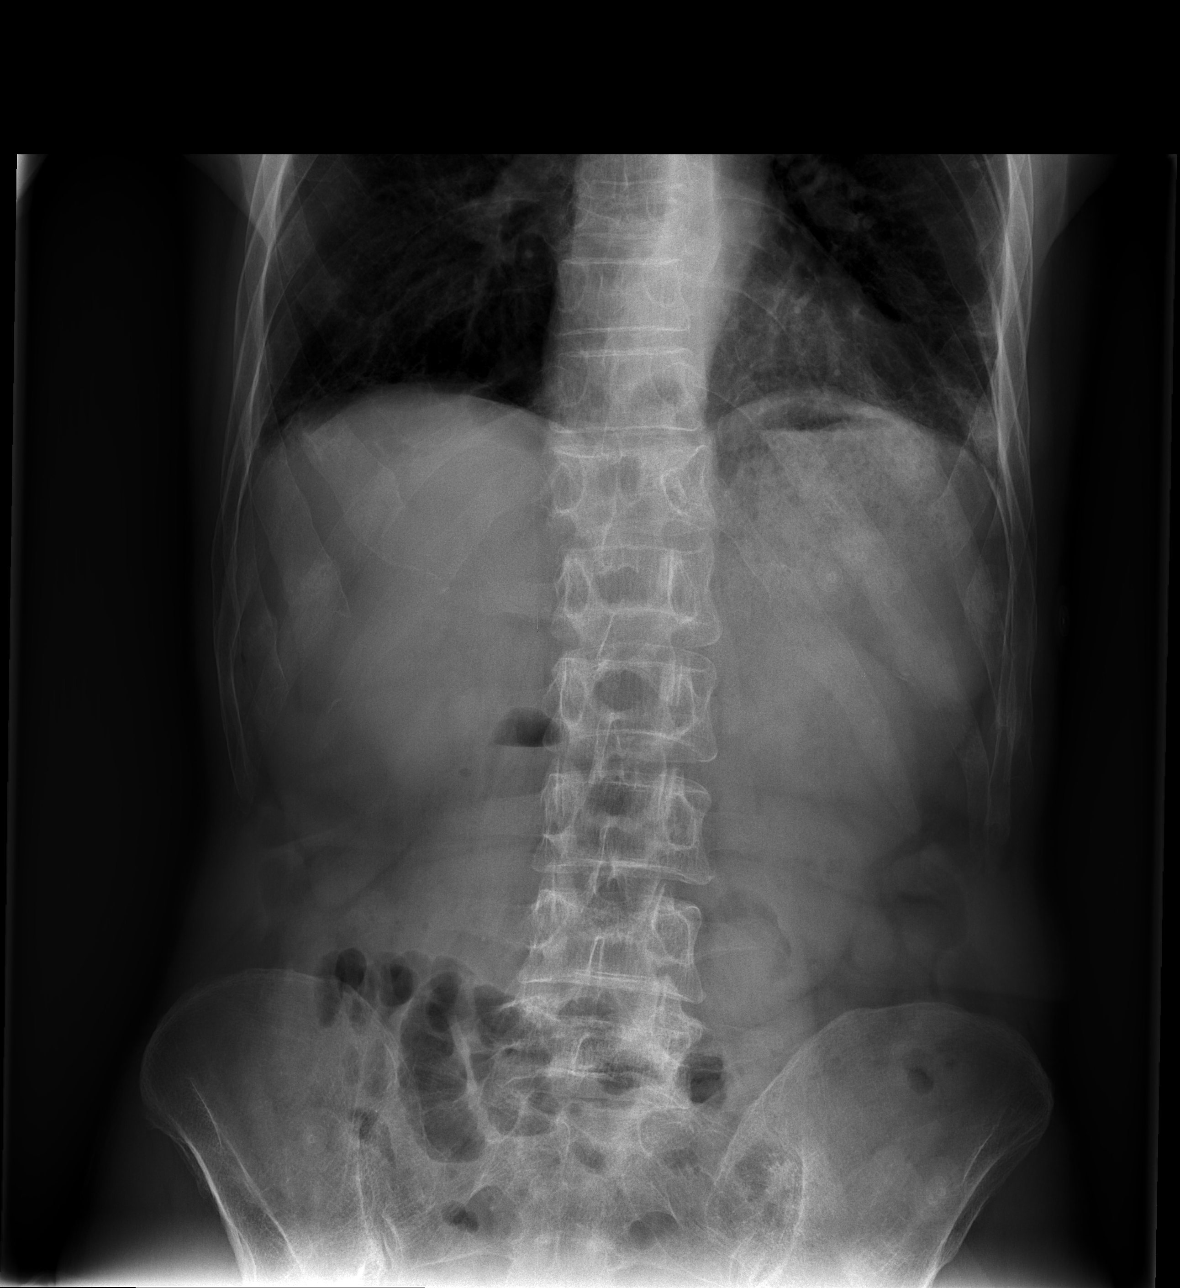

[t abdomen supine]
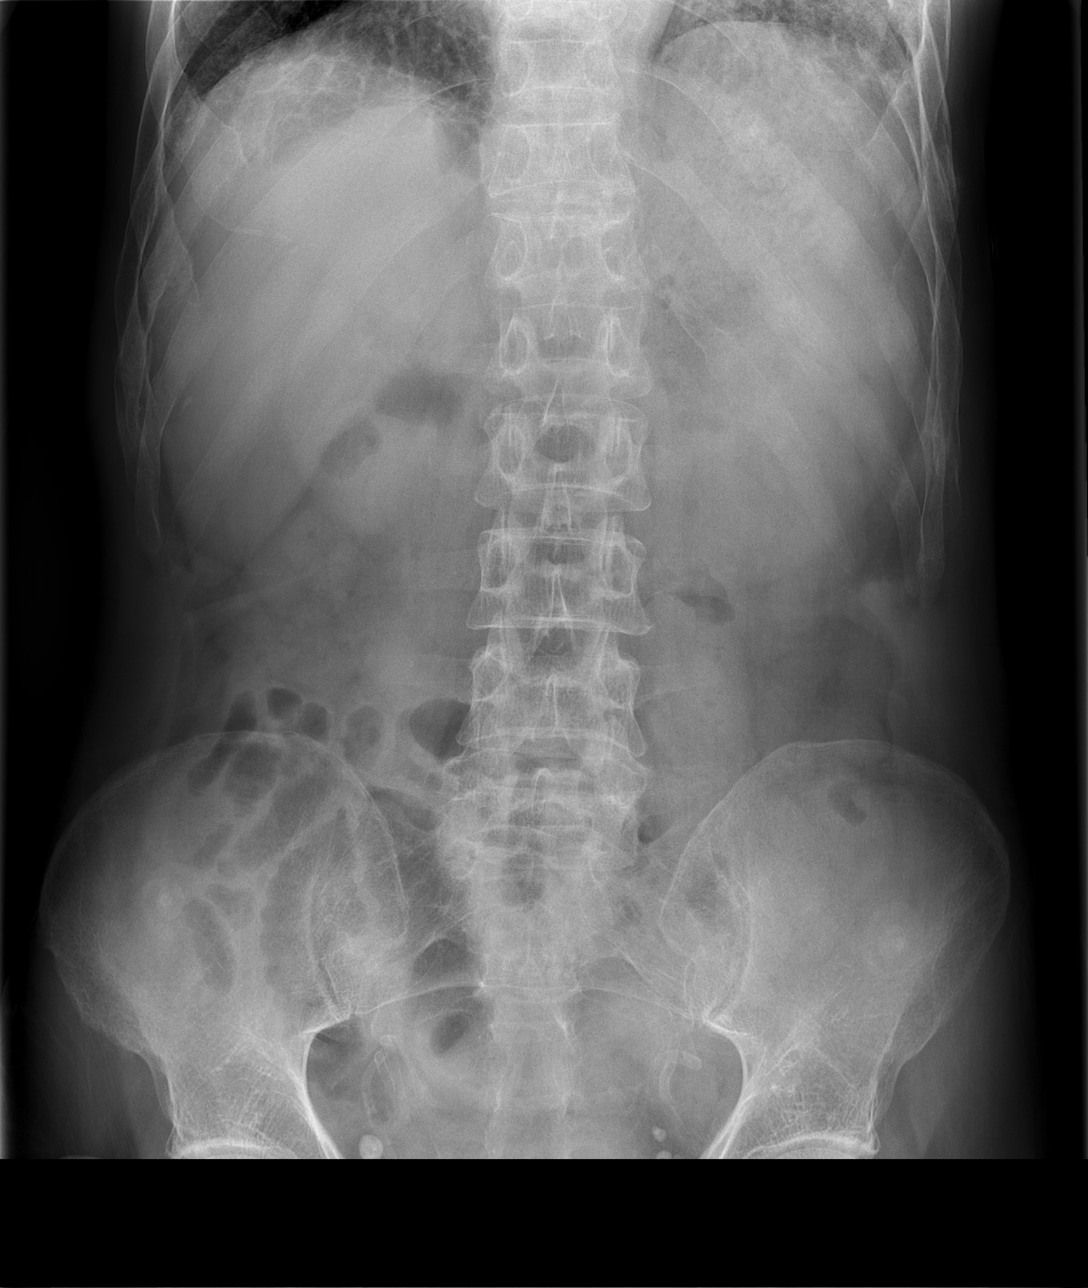

[3 of 3 positions shown; findings below may reference images not displayed]

FINDINGS: There is increased opacity seen at the left lung base.
The lungs are otherwise clear.  The heart is normal in size.

There is a general paucity of small bowel gas with only a few non
dilated loops of gas-filled small bowel seen in the right lower
quadrant.  No air fluid levels are seen on the upright exam.
There is no intra-abdominal free air. The bones appear osteopenic.
Calcifications are seen in the pelvis including calcifications of
the vas compatible with diabetes and phleboliths.
IMPRESSION: 1.  Nonobstructive bowel gas pattern.
2.  Increased opacity at the left lung base which is nonspecific
and may relate to infection, aspiration and/or atelectasis.  A PA
and lateral may be of use for further evaluation.

## 2010-05-01 IMAGING — CR DG CHEST 2V
2 series · 2 of 2 positions shown · non-contrast
Comparison: [DATE]

CLINICAL DATA: Abdominal and back pain

CHEST - 2 VIEW

[w chest pa]
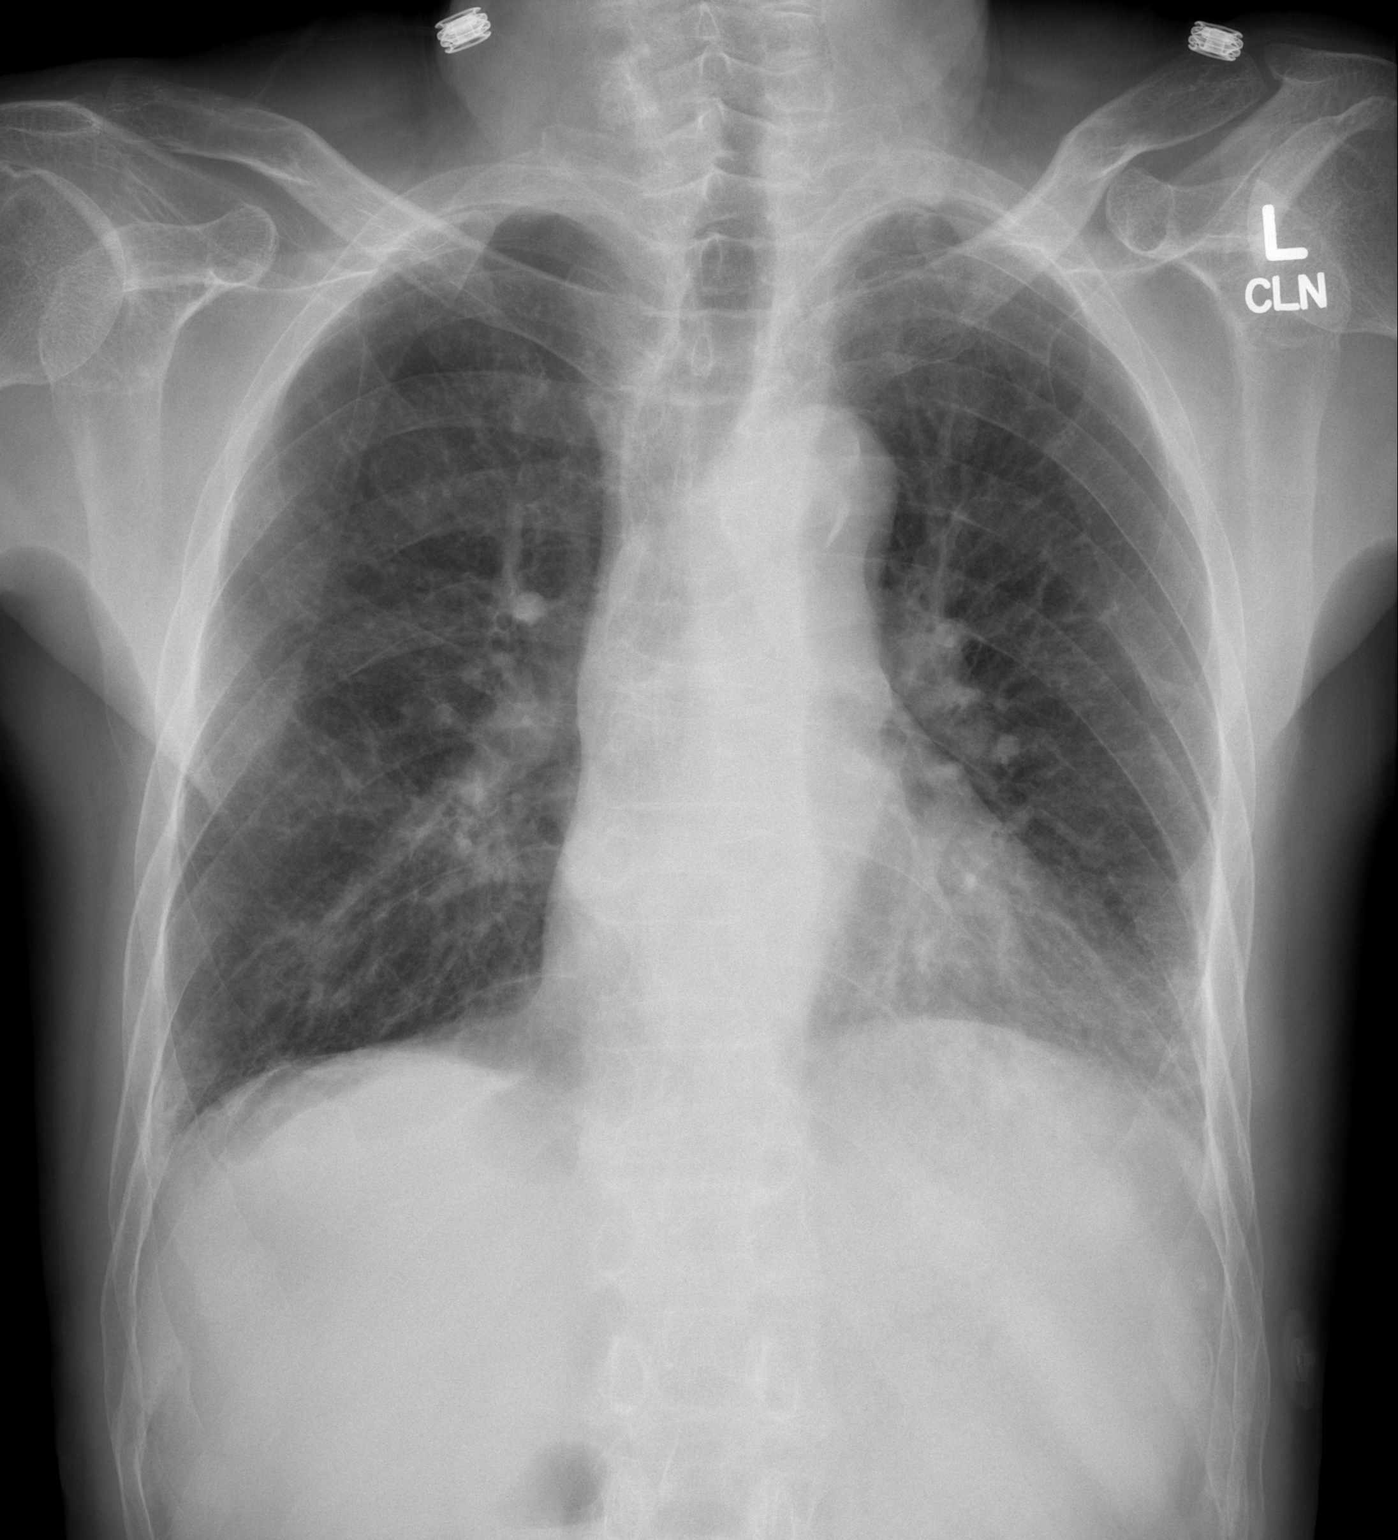

[w chest lat]
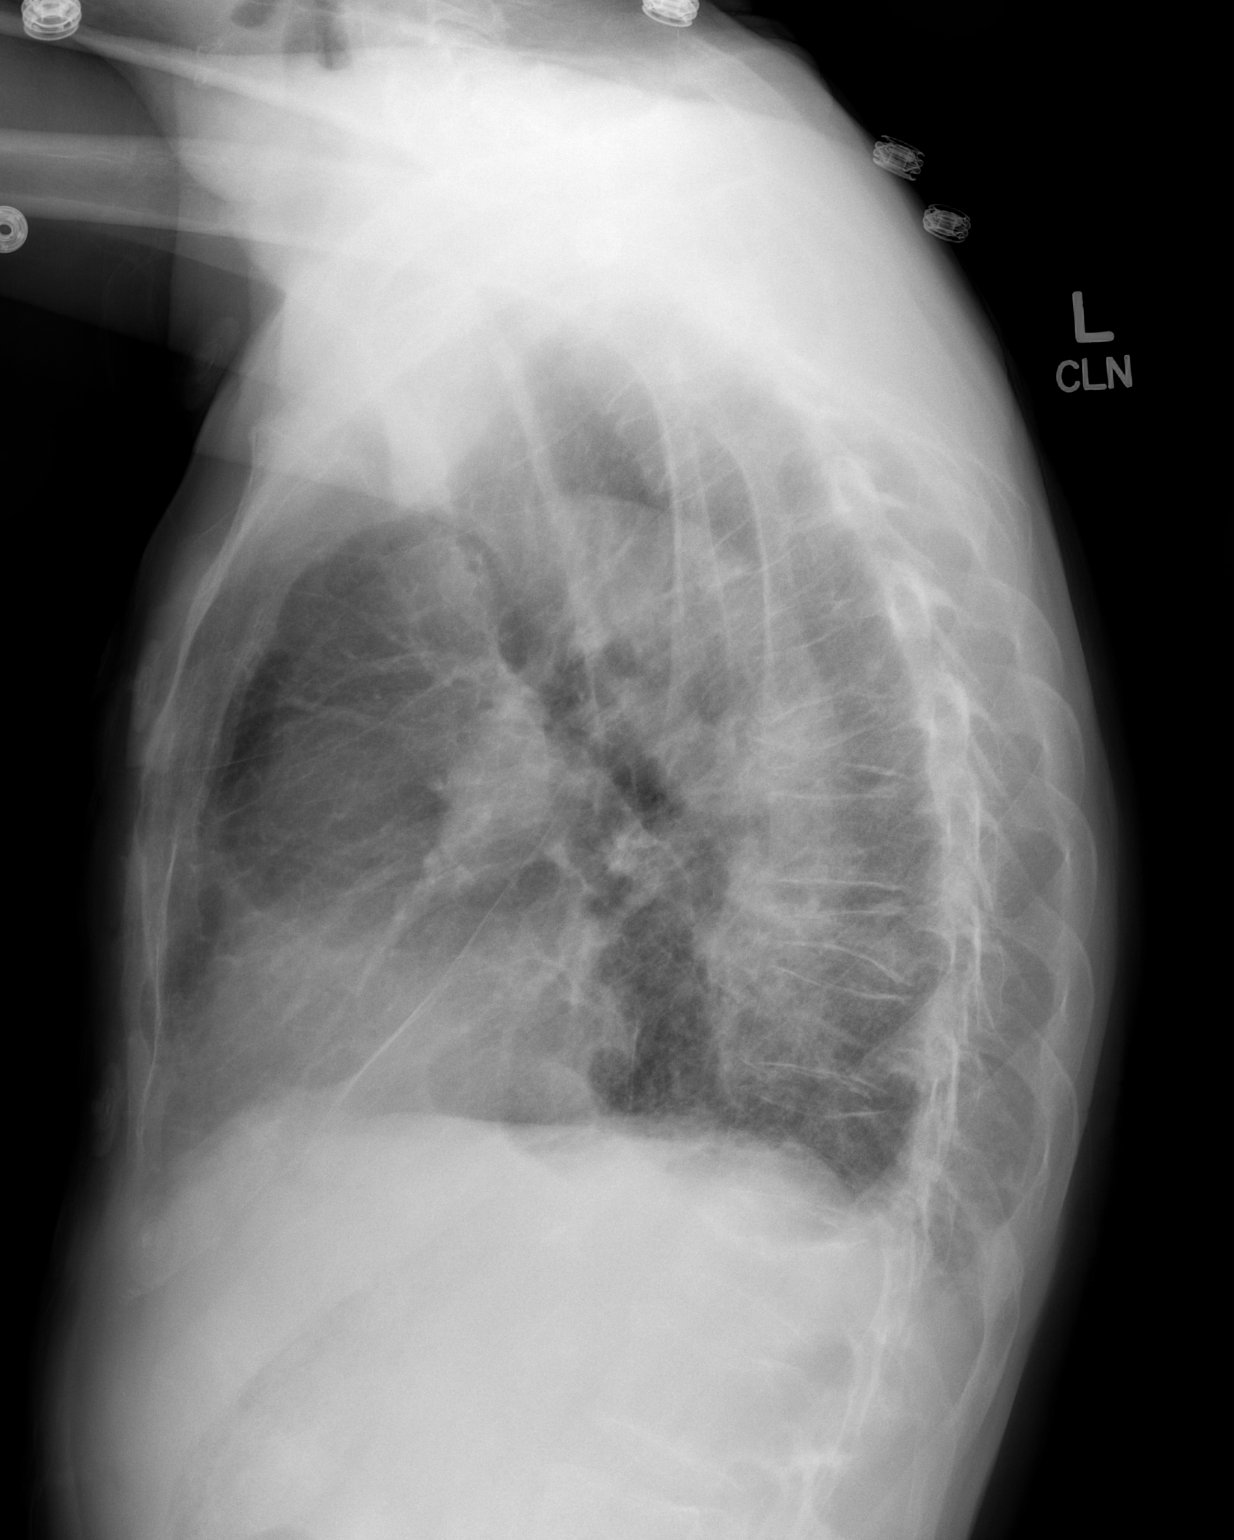

[2 of 2 positions shown; findings below may reference images not displayed]

FINDINGS: Chronic changes are seen throughout the lungs without
confluent airspace opacities.  Specifically, the previously
described left sided airspace opacities have resolved.  There is a
small left pleural effusion remaining.  The heart is normal in
size.  Bones are osteopenic.  There has been interval compression
fracture of a mid to lower thoracic vertebral body with 75% loss of
vertebral body height.  No significant bony retropulsion is seen on
this exam.  Other compression fractures are unchanged.  Previously
described rib fractures are also unchanged.
IMPRESSION: 1.  Osteopenia with acute compression fracture in the mid to lower
thoracic spine. Other compression fractures are unchanged.
2.  Resolution of previously described airspace left-sided opacity
with a small residual pleural effusion.

## 2010-05-02 IMAGING — CT CT ABD-PELV W/ CM
2 of 5 series · 16 of 46 positions shown, 18 images · IV contrast (water/omni  & 80ml omni 300)
Comparison: [DATE]

CLINICAL DATA: Back pain.  Abdominal pain.

CT ABDOMEN AND PELVIS WITH CONTRAST
TECHNIQUE: Multidetector CT imaging of the abdomen and pelvis was
performed following the standard protocol during bolus
administration of intravenous contrast.
Contrast: 80 ml [SW].

[Series 2: routine abdomen · axial · 0.69mm/px · z∈[-418,-58]mm · 13 of 82 slices shown, 15 images]
[im 5/82  soft-tissue]
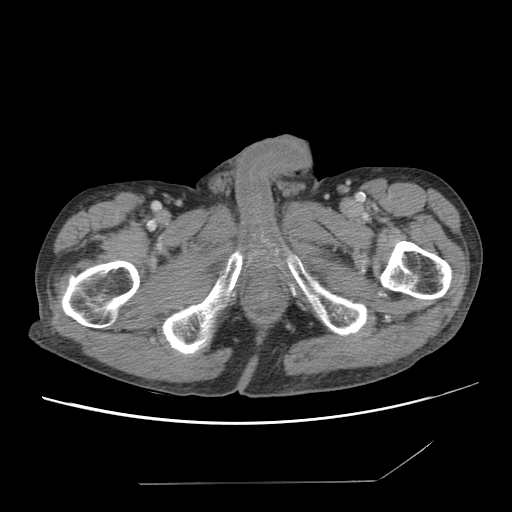
[im 5/82  bone]
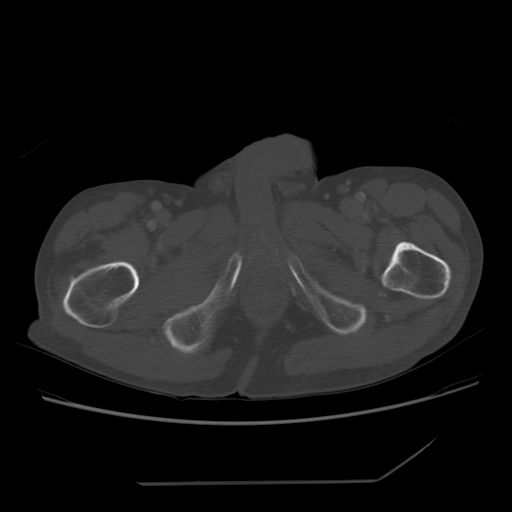
[im 10/82  soft-tissue]
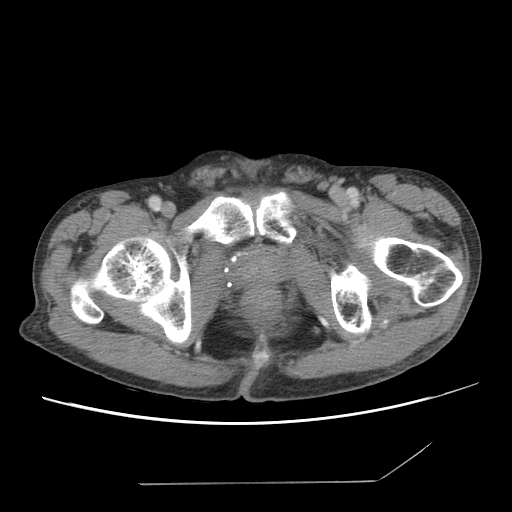
[im 19/82  soft-tissue]
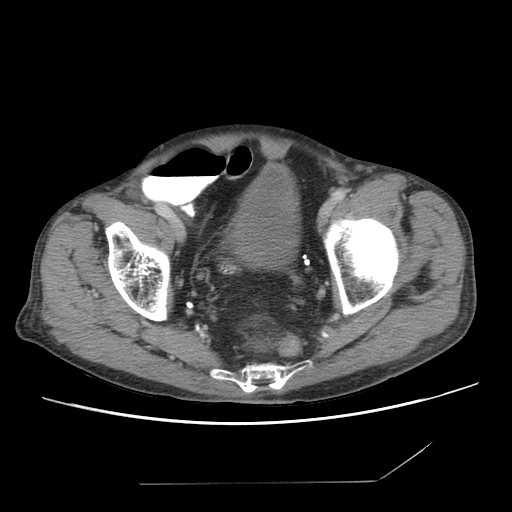
[im 23/82  soft-tissue]
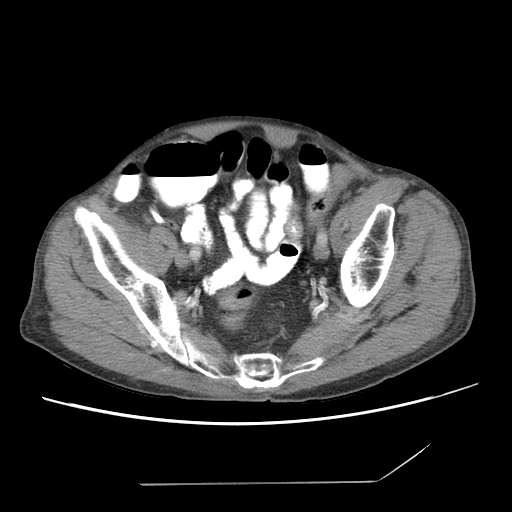
[im 28/82  soft-tissue]
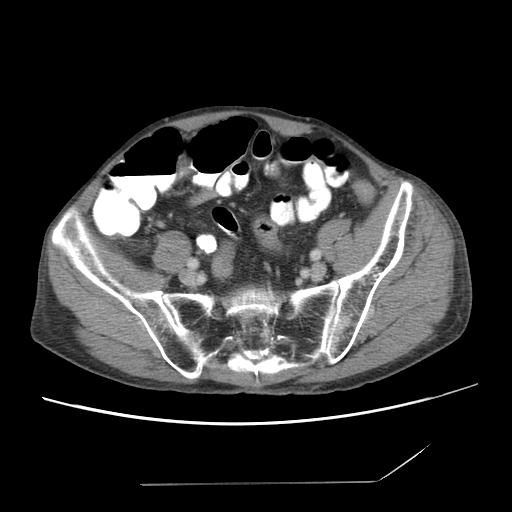
[im 37/82  soft-tissue]
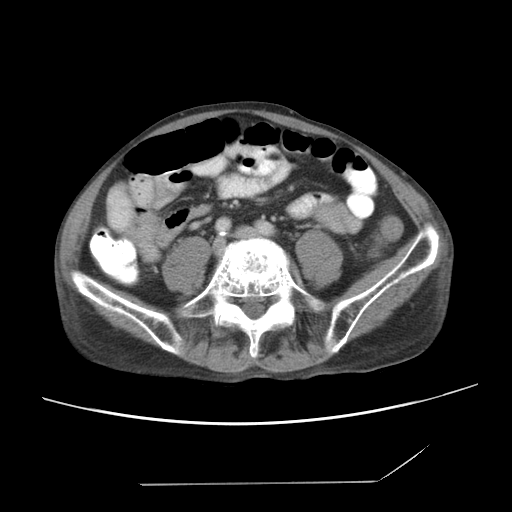
[im 41/82  soft-tissue]
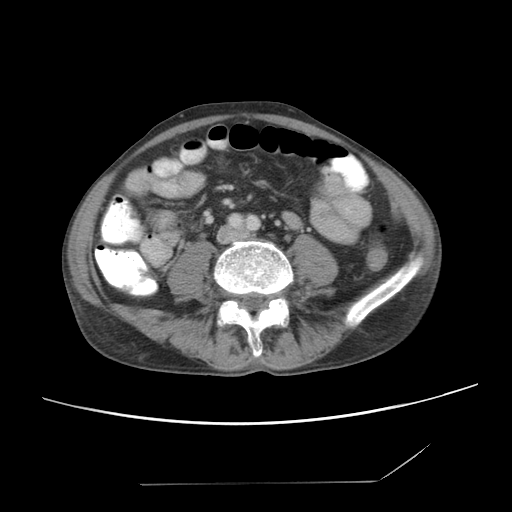
[im 46/82  soft-tissue]
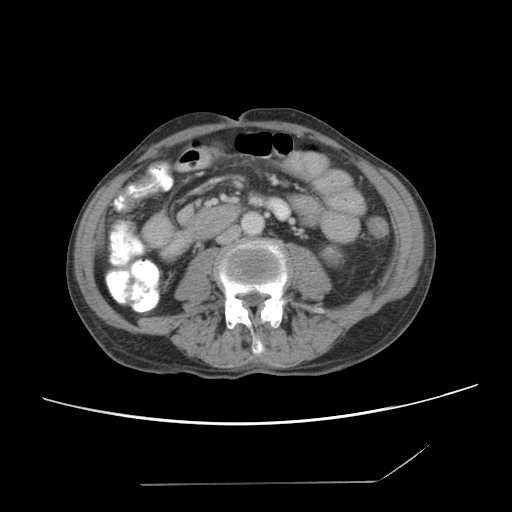
[im 55/82  soft-tissue]
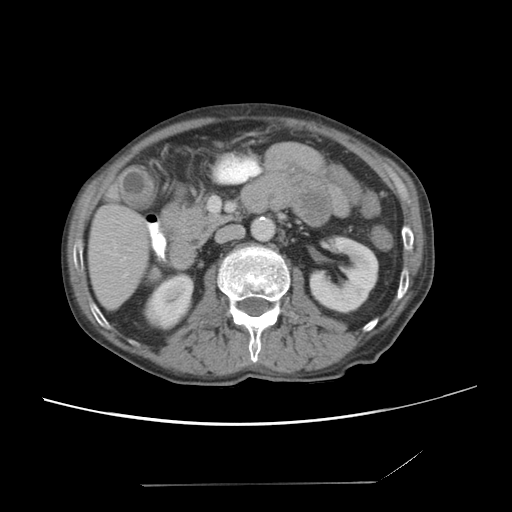
[im 55/82  bone]
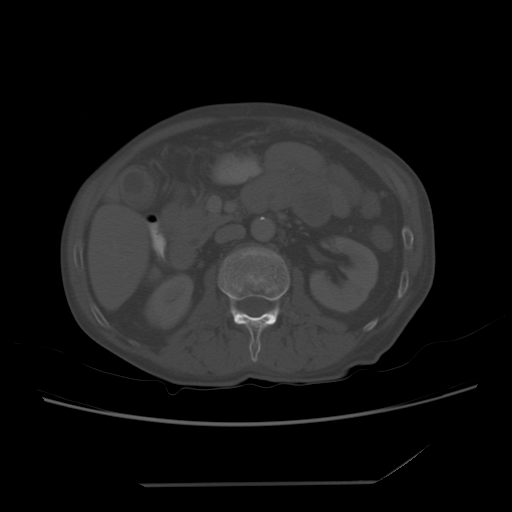
[im 59/82  soft-tissue]
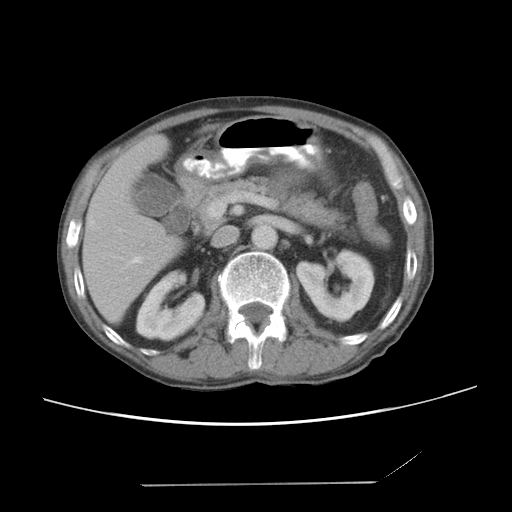
[im 64/82  soft-tissue]
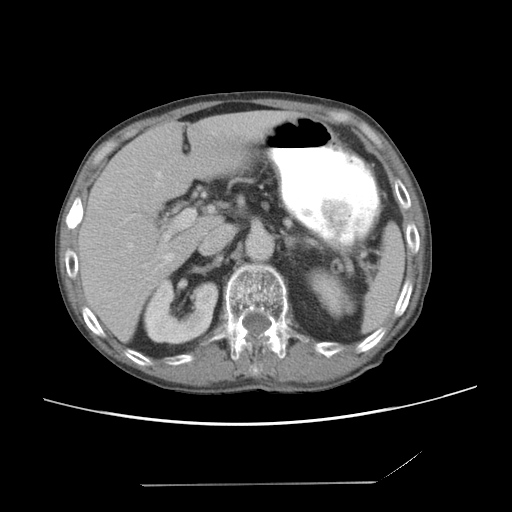
[im 73/82  soft-tissue]
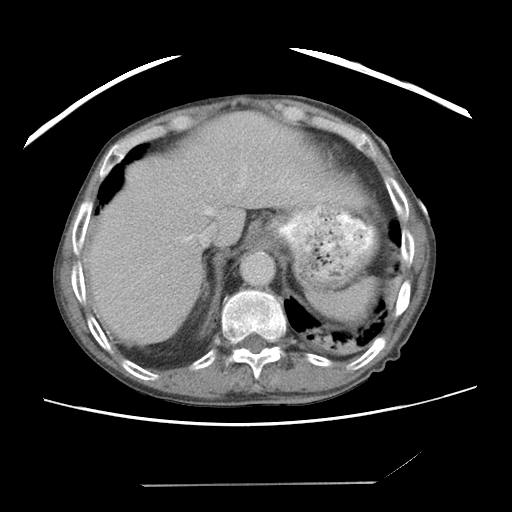
[im 77/82  soft-tissue]
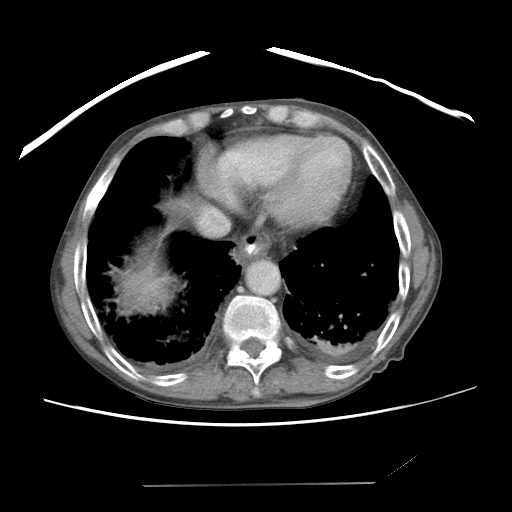

[Series 401: cor · coronal · 0.81mm/px · 3 of 72 slices shown]
[im 24/72  soft-tissue]
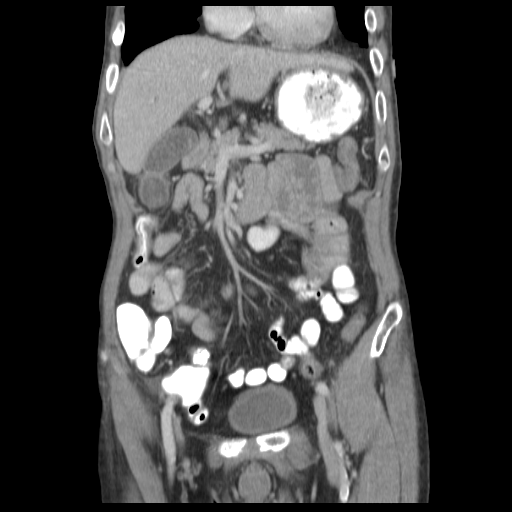
[im 32/72  soft-tissue]
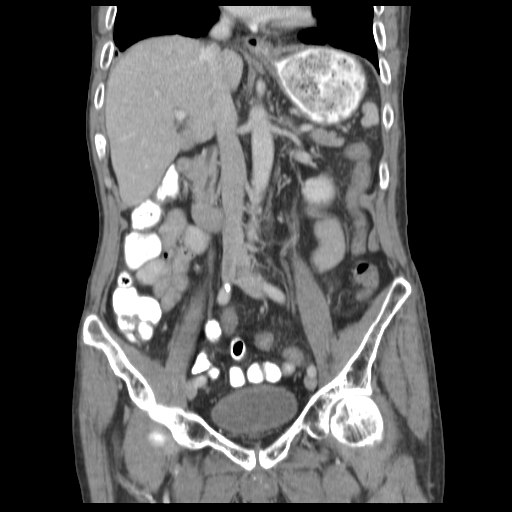
[im 40/72  soft-tissue]
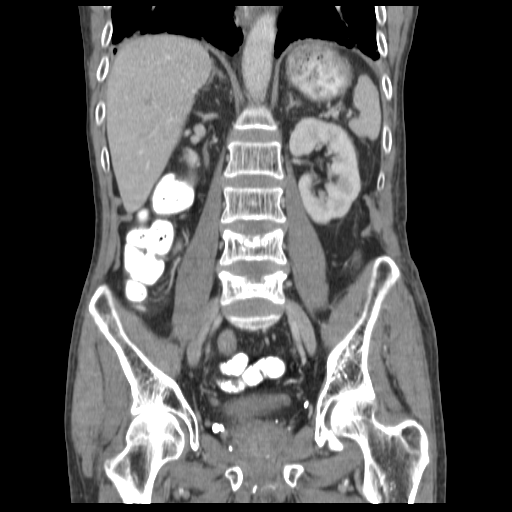

[16 of 46 positions shown; findings below may reference images not displayed]

FINDINGS: Small bilateral pleural effusions.  Dependent atelectasis
in the right lower lobe.  Small hemangioma present in the posterior
right hepatic lobe measuring 15 mm.  Normal renal enhancement and
excretion of contrast.

The gallbladder is filled with heterogeneous material, similar to
prior exam.  There is also gallbladder wall thickening and
enhancement.  This may represent acute or chronic cholecystitis.
Heterogeneous material extends into the gallbladder neck.  No
common bile duct dilation is identified.  The spleen appears
normal.  Both adrenal glands are normal.  Patulous gastroesophageal
junction with thickening of the distal esophagus and a small hiatal
hernia.  Question reflux esophagitis.

Abdominal aortic atherosclerosis without aneurysm.  Small bowel
appears within normal limits.  Normal contrast opacified appendix.
Colon appears within normal limits.  Urinary bladder unremarkable.
Pelvic vascular calcifications and phleboliths noted.  Urinary
bladder appears within normal limits.

Chronic unchanged L5 vertebral body compression fracture with
slightly increased progressive collapse of the L4 compression
fracture.

Unchanged T12 compression fracture and osteopenia.
IMPRESSION: 1.  Heterogeneous material in the gallbladder with mild wall
thickening and enhancement.  Comparing to the noncontrast CT of
[DATE], this is probably little changed.  This may represent
acute on chronic or chronic cholecystitis.
2.  Right hepatic lobe hemangioma.
3.  Osteopenia and progressive collapse of L4 vertebral body
compression fracture.  L5 and T12 compression fractures appear
unchanged.
4.  Small hiatal hernia and patulous gastroesophageal junction with
distal esophageal thickening suggesting reflux esophagitis.
5.  Bilateral pleural effusions with dependent atelectasis.

## 2010-05-05 ENCOUNTER — Emergency Department (HOSPITAL_COMMUNITY): Admission: EM | Admit: 2010-05-05 | Discharge: 2010-05-05 | Payer: Self-pay | Admitting: Emergency Medicine

## 2010-05-05 IMAGING — CR DG CHEST 2V
2 series · 2 of 2 positions shown · non-contrast
Comparison: [DATE]

CLINICAL DATA: Chest pain.  Malaise.

CHEST - 2 VIEW

[view not recorded (1 of 2)]
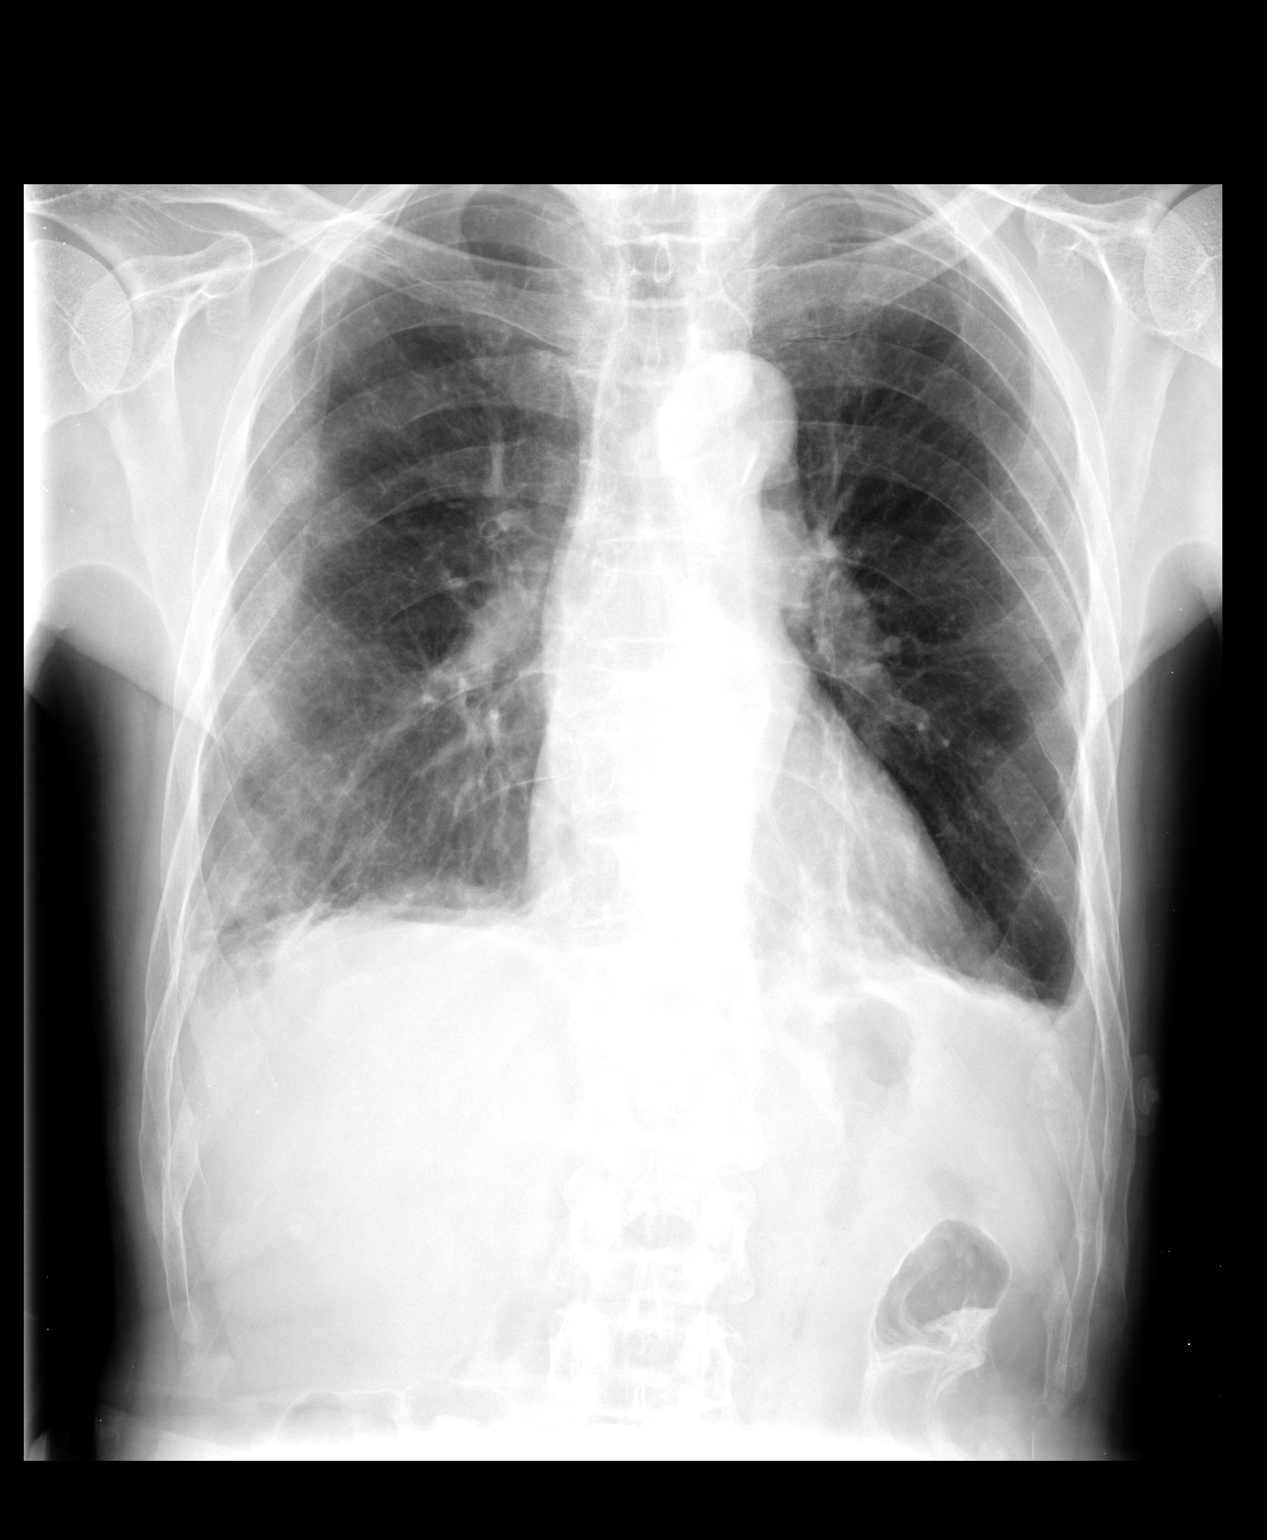

[view not recorded (2 of 2)]
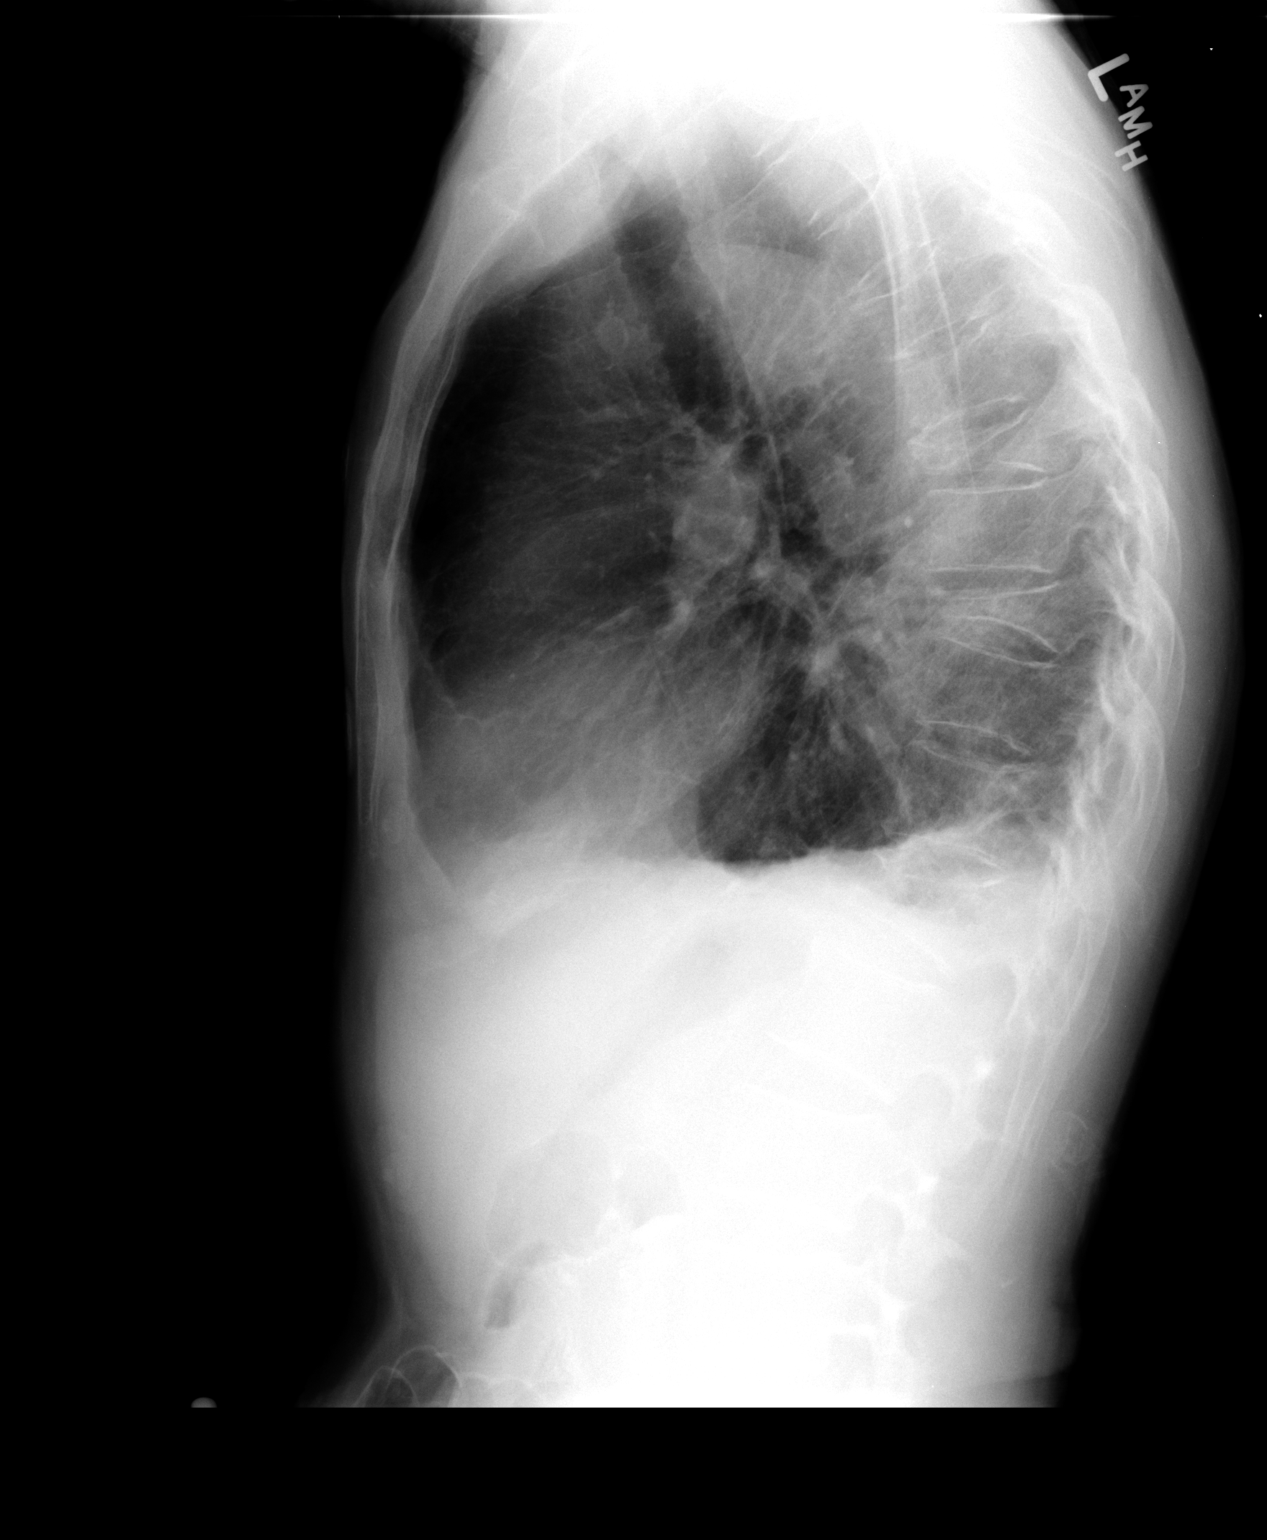

[2 of 2 positions shown; findings below may reference images not displayed]

FINDINGS: Increased infiltrate or atelectasis is seen in both lung
bases.  Tiny pleural effusions are also seen bilaterally.

Heart size is normal.  Mild diffuse prominence of pulmonary
interstitial lung markings is stable and likely chronic.  No mass
or adenopathy identified.  The several mid and lower thoracic
vertebral body compression fractures again noted.
IMPRESSION: Increased mild bibasilar infiltrates or atelectasis, and tiny
bilateral pleural effusions.

## 2010-05-05 IMAGING — US US ABDOMEN COMPLETE
1 series · 14 of 25 positions shown · non-contrast
Comparison: CT [DATE]

CLINICAL DATA: Abdominal pain

COMPLETE ABDOMINAL ULTRASOUND

[Series 1: us abdomen complete · 0.26mm/px · 14 of 48 slices shown]
[im 1/48]
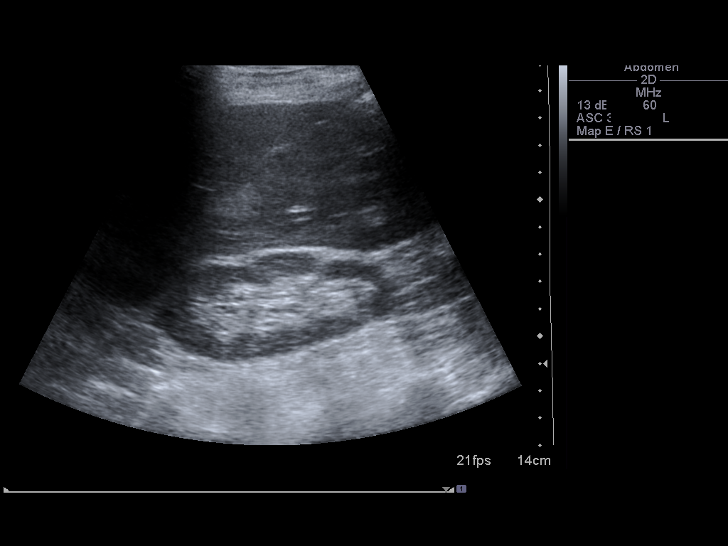
[im 4/48]
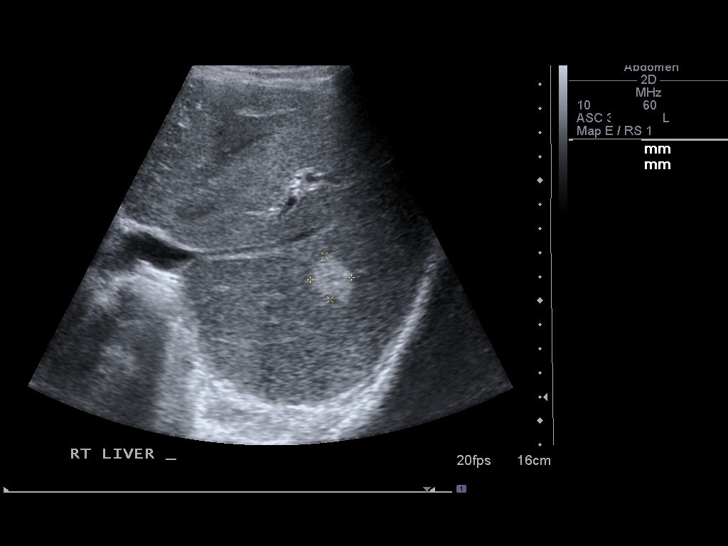
[im 8/48]
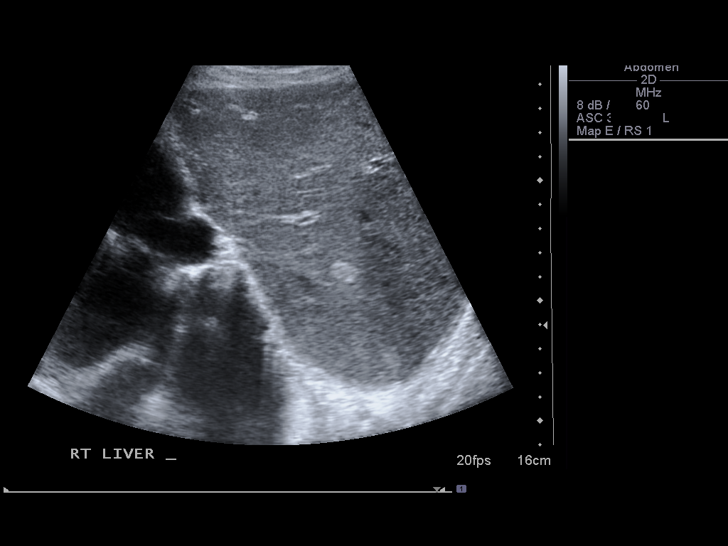
[im 12/48]
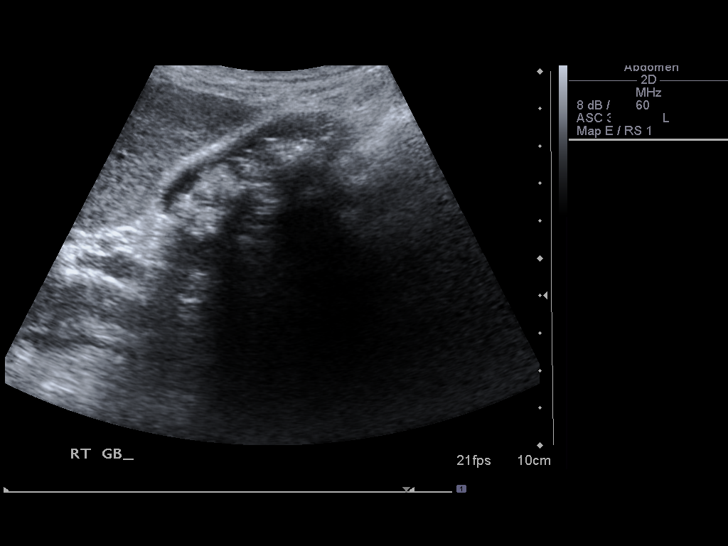
[im 16/48]
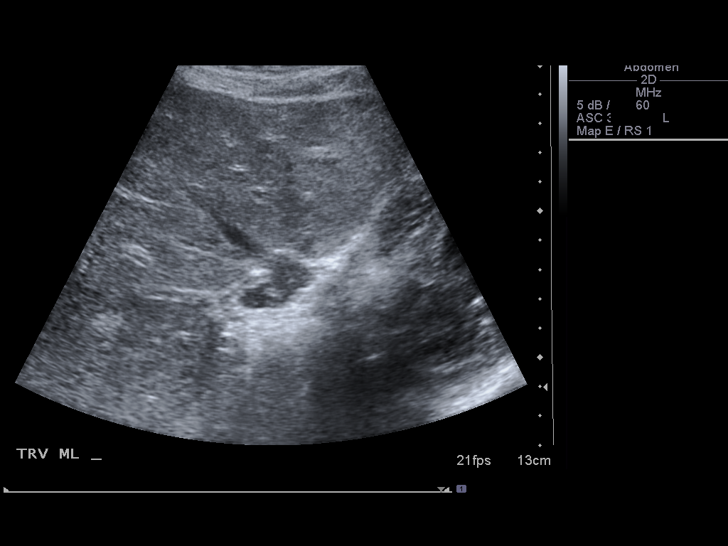
[im 18/48]
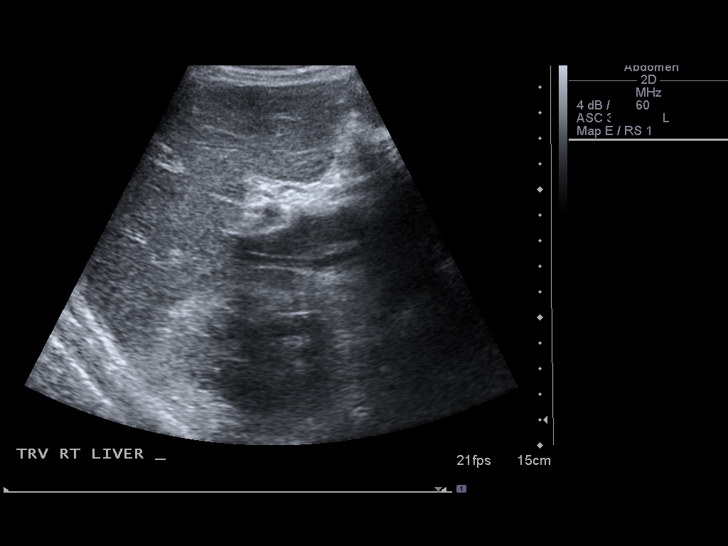
[im 22/48]
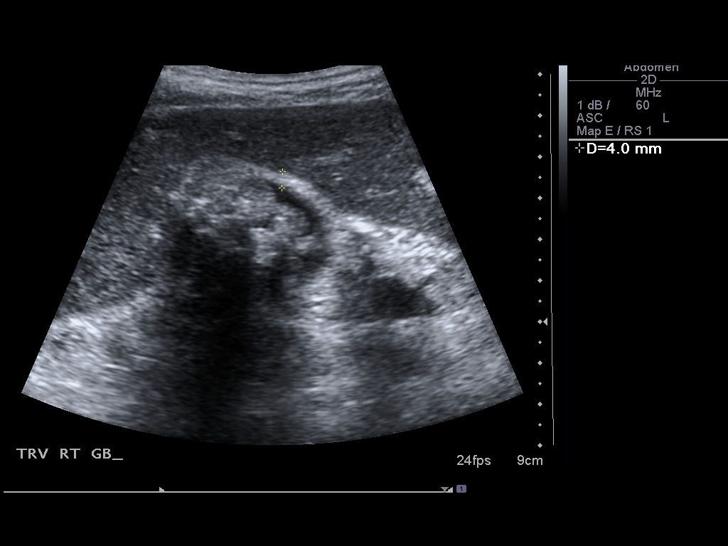
[im 26/48]
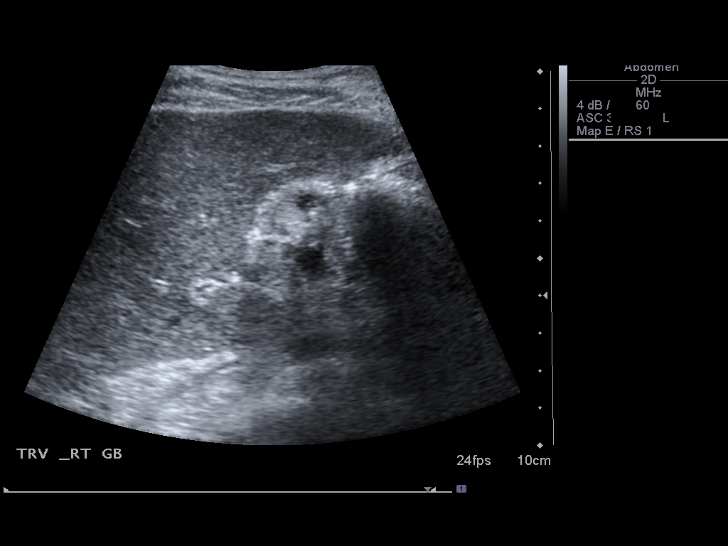
[im 30/48]
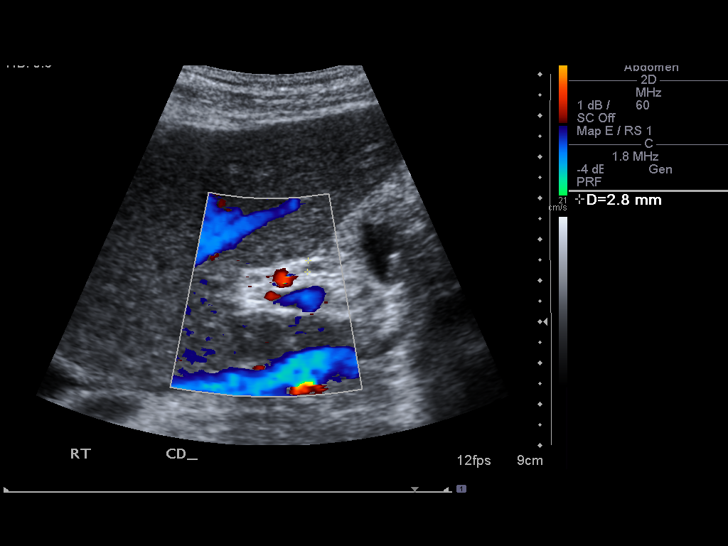
[im 32/48]
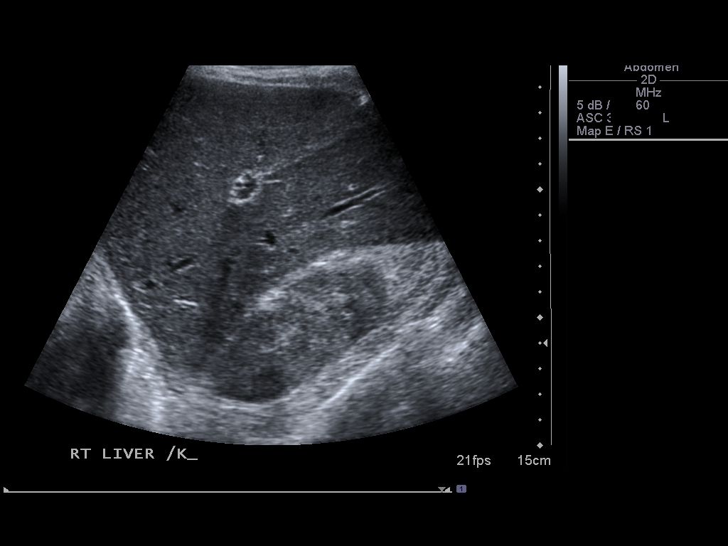
[im 36/48]
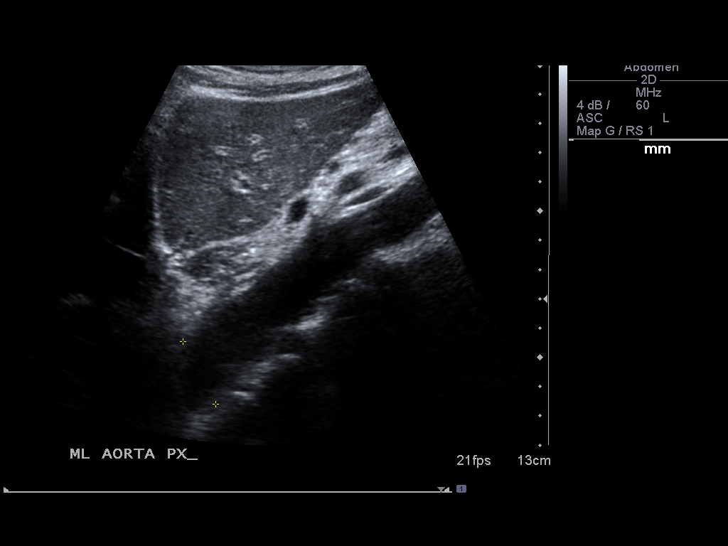
[im 40/48]
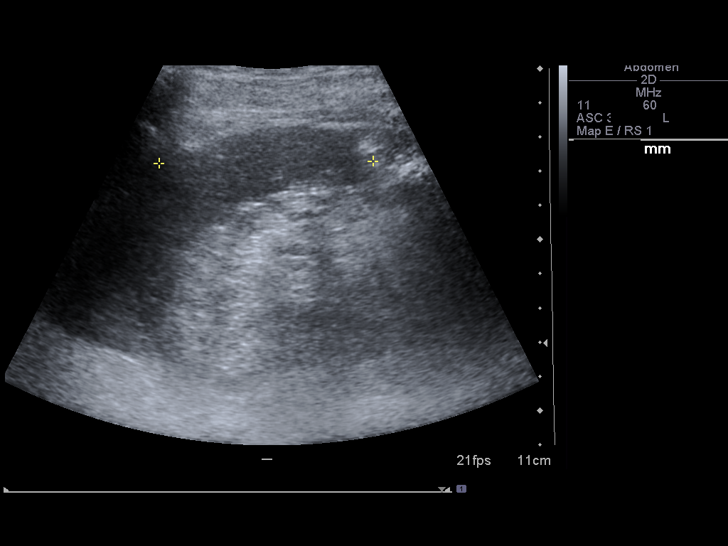
[im 44/48]
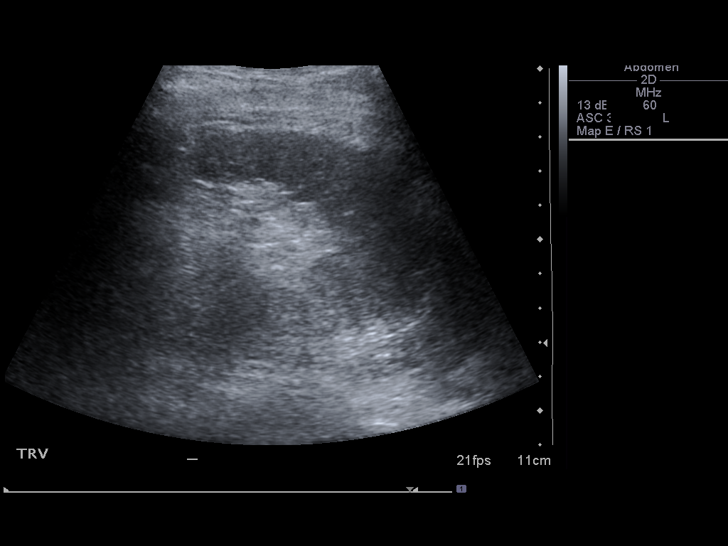
[im 48/48]
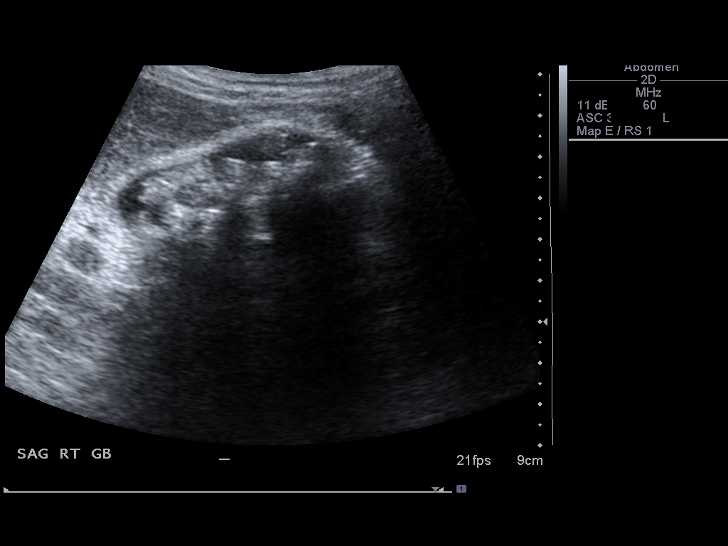

[14 of 25 positions shown; findings below may reference images not displayed]

FINDINGS: Gallbladder:  The gallbladder is filled with gallstones.  The
gallbladder wall is thickened measuring 4 mm.  This was also
identified on the prior CT and  may be due to chronic
cholecystitis.  Acute cholecystitis could also be present.

Common bile duct:  2.8 mm

Liver:  17 x 18 mm hyperechoic lesion in the posterior right lobe
liver compatible with hemangioma, as identified on the CT.  No
other liver lesions are identified.

IVC:  Appears normal.

Pancreas:  No focal abnormality seen.

Spleen:  6.2 cm in length

Right Kidney:  10.0 cm in length without obstruction or mass

Left Kidney:  10.2 cm in length without obstruction or mass

Abdominal aorta:  Negative for aneurysm.
IMPRESSION: Cholelithiasis.  Gallbladder wall thickening compatible with
chronic cholecystitis and possibly superimposed acute
cholecystitis.

Hemangioma in the liver.

## 2010-05-13 ENCOUNTER — Emergency Department (HOSPITAL_COMMUNITY): Admission: EM | Admit: 2010-05-13 | Discharge: 2010-05-13 | Payer: Self-pay | Admitting: Emergency Medicine

## 2010-05-15 ENCOUNTER — Emergency Department (HOSPITAL_COMMUNITY): Admission: EM | Admit: 2010-05-15 | Discharge: 2010-05-15 | Payer: Self-pay | Admitting: Emergency Medicine

## 2010-08-09 ENCOUNTER — Inpatient Hospital Stay (HOSPITAL_COMMUNITY): Admission: AD | Admit: 2010-08-09 | Discharge: 2010-03-02 | Payer: Self-pay | Admitting: Psychiatry

## 2010-09-22 ENCOUNTER — Emergency Department (HOSPITAL_COMMUNITY)
Admission: EM | Admit: 2010-09-22 | Discharge: 2010-09-22 | Payer: Self-pay | Source: Home / Self Care | Admitting: Emergency Medicine

## 2010-09-25 LAB — COMPREHENSIVE METABOLIC PANEL
AST: 17 U/L (ref 0–37)
Albumin: 3.6 g/dL (ref 3.5–5.2)
Alkaline Phosphatase: 74 U/L (ref 39–117)
Chloride: 102 mEq/L (ref 96–112)
GFR calc Af Amer: >60 mL/min (ref >60)
GFR calc non Af Amer: >60 mL/min (ref >60)
Potassium: 4.8 mEq/L (ref 3.5–5.1)
Total Bilirubin: 0.8 mg/dL (ref 0.3–1.2)

## 2010-09-25 LAB — ETHANOL: Alcohol, Ethyl (B): <5 mg/dL (ref 0–10)

## 2010-09-25 LAB — LIPASE, BLOOD: Lipase: 24 U/L (ref 11–59)

## 2010-09-25 LAB — CBC
MCH: 33.2 pg (ref 26.0–34.0)
MCHC: 33.9 g/dL (ref 30.0–36.0)

## 2010-10-09 ENCOUNTER — Emergency Department (HOSPITAL_COMMUNITY)
Admission: EM | Admit: 2010-10-09 | Discharge: 2010-10-10 | Disposition: A | Discharge status: Home / Self Care | Payer: Self-pay | Attending: Emergency Medicine | Admitting: Emergency Medicine

## 2010-10-09 DIAGNOSIS — I10 Essential (primary) hypertension: Secondary | ICD-10-CM | POA: Insufficient documentation

## 2010-10-09 DIAGNOSIS — F1010 Alcohol abuse, uncomplicated: Secondary | ICD-10-CM | POA: Insufficient documentation

## 2010-10-09 LAB — RAPID URINE DRUG SCREEN, HOSP PERFORMED
Amphetamines: NOT DETECTED
Barbiturates: NOT DETECTED
Benzodiazepines: NOT DETECTED
Cocaine: NOT DETECTED
Opiates: NOT DETECTED
Tetrahydrocannabinol: NOT DETECTED

## 2010-10-09 LAB — BASIC METABOLIC PANEL
BUN: 15 mg/dL (ref 6–23)
CO2: 29 mEq/L (ref 19–32)
Calcium: 9.2 mg/dL (ref 8.4–10.5)
Chloride: 100 mEq/L (ref 96–112)
Creatinine, Ser: 0.94 mg/dL (ref 0.4–1.5)
GFR calc Af Amer: >60 mL/min (ref >60)
GFR calc non Af Amer: >60 mL/min (ref >60)
Glucose, Bld: 100 mg/dL — ABNORMAL HIGH (ref 70–99)
Potassium: 3.8 mEq/L (ref 3.5–5.1)
Sodium: 139 mEq/L (ref 135–145)

## 2010-10-09 LAB — DIFFERENTIAL
Lymphocytes Relative: 25 % (ref 12–46)
Lymphs Abs: 2 10*3/uL (ref 0.7–4.0)
Monocytes Absolute: 0.8 10*3/uL (ref 0.1–1.0)
Monocytes Relative: 10 % (ref 3–12)

## 2010-10-09 LAB — CBC
MCV: 99.3 fL (ref 78.0–100.0)
Platelets: 249 10*3/uL (ref 150–400)
RBC: 4.25 MIL/uL (ref 4.22–5.81)
RDW: 13.3 % (ref 11.5–15.5)
WBC: 8 10*3/uL (ref 4.0–10.5)

## 2010-10-09 LAB — ETHANOL: Alcohol, Ethyl (B): <5 mg/dL (ref 0–10)

## 2010-10-14 ENCOUNTER — Emergency Department: Payer: Self-pay | Admitting: Emergency Medicine

## 2010-10-15 IMAGING — CT CT HEAD WITHOUT CONTRAST
2 series · 16 of 30 positions shown, 20 images · non-contrast
Comparison: none

REASON FOR EXAM: FALL, HIT HEAD
COMMENTS:   May transport without cardiac monitor

PROCEDURE:     CT  - CT HEAD WITHOUT CONTRAST  - [DATE] [DATE]
RESULT:     Head CT dated [DATE].
TECHNIQUE: Helical noncontrasted 5 mm sections were obtained from the skull
base to the vertex.

[Series 2: without · axial · non-contrast · 0.44mm/px · z∈[+380,+505]mm · 13 of 31 slices shown, 17 images]
[im 3/31  brain]
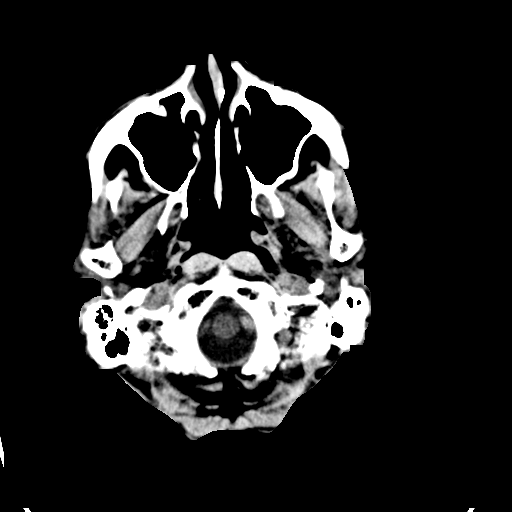
[im 3/31  bone]
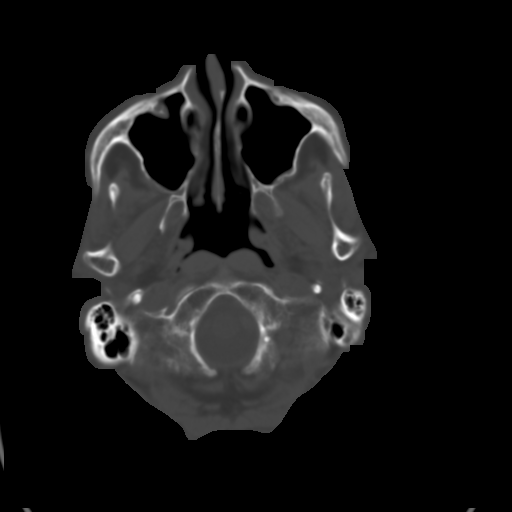
[im 5/31  brain]
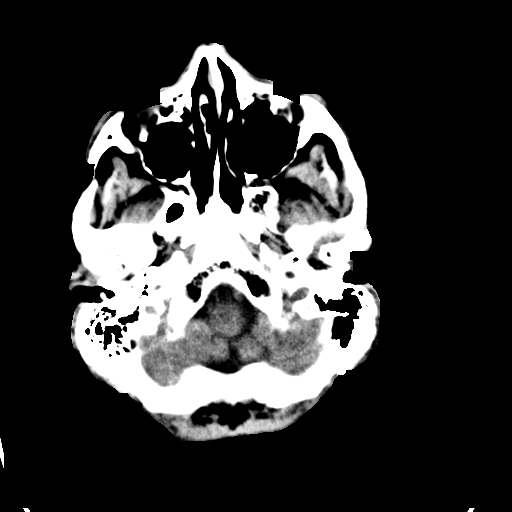
[im 7/31  brain]
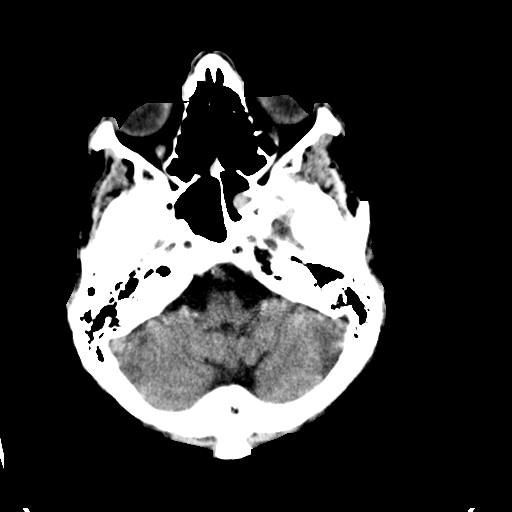
[im 9/31  brain]
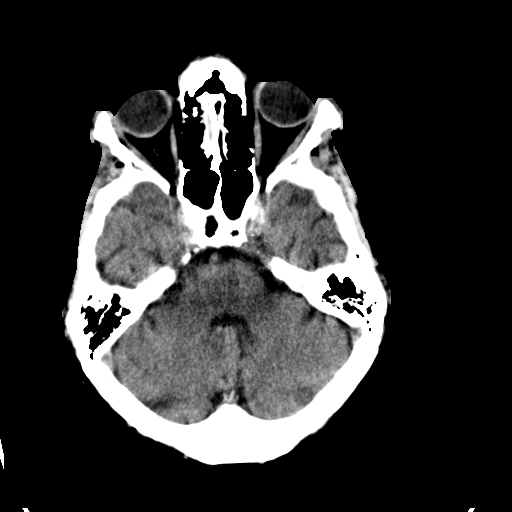
[im 11/31  brain]
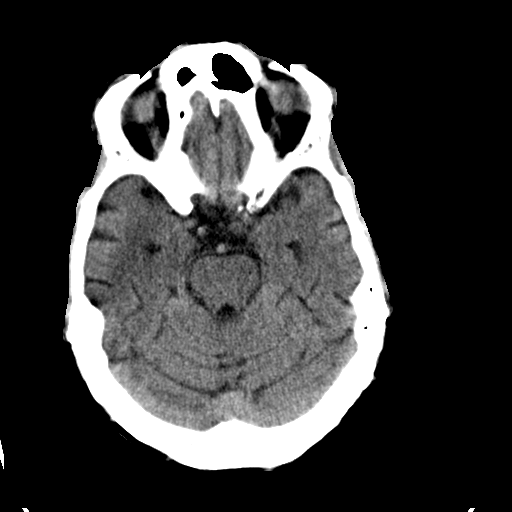
[im 11/31  bone]
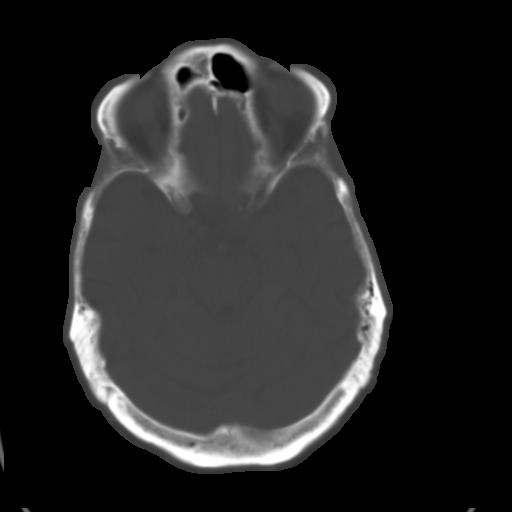
[im 13/31  brain]
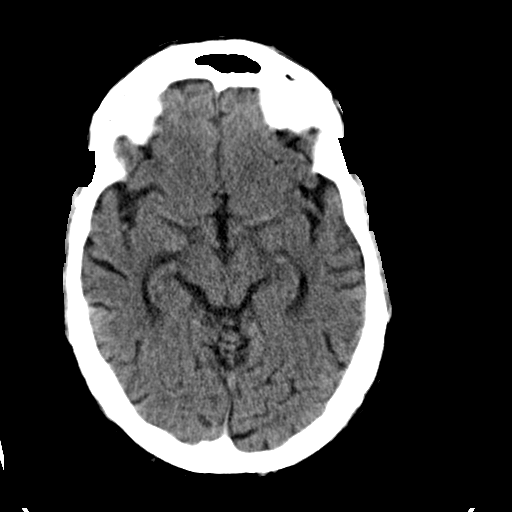
[im 16/31  brain]
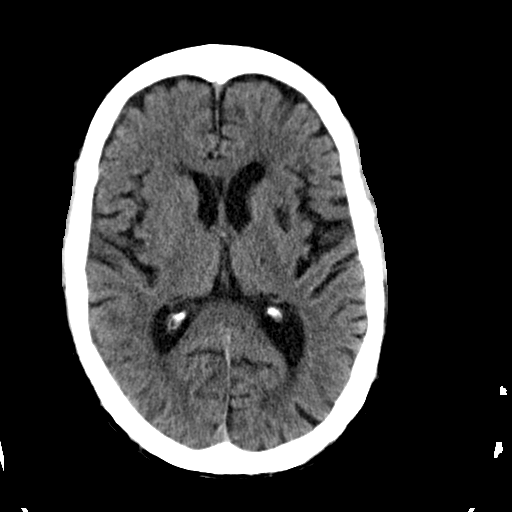
[im 18/31  brain]
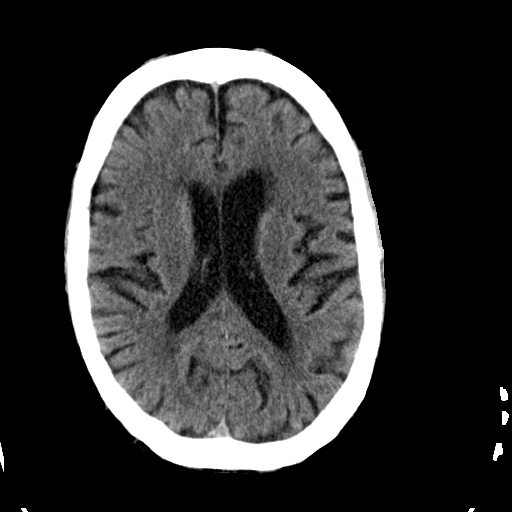
[im 20/31  brain]
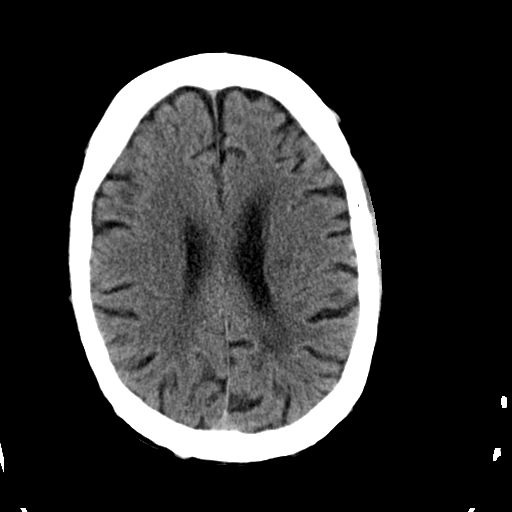
[im 20/31  bone]
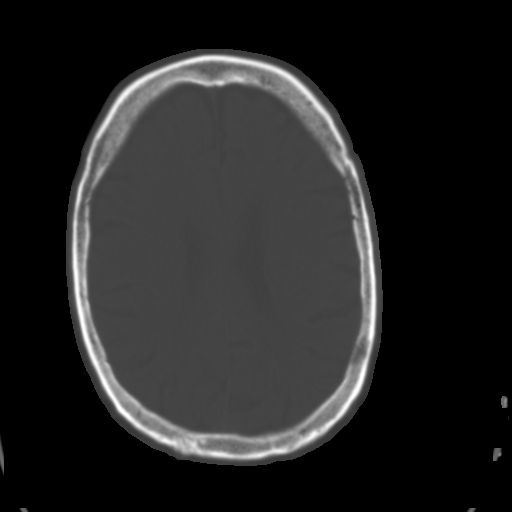
[im 22/31  brain]
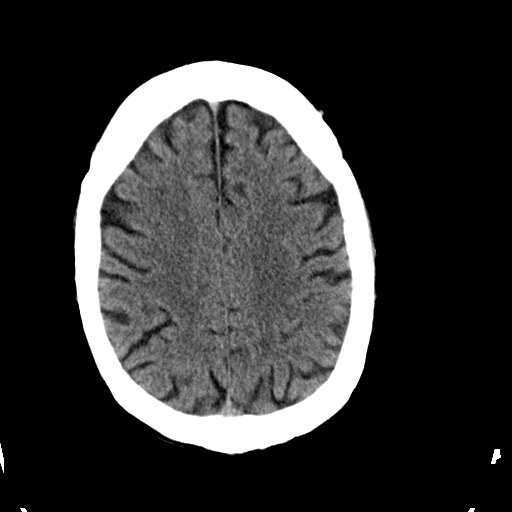
[im 24/31  brain]
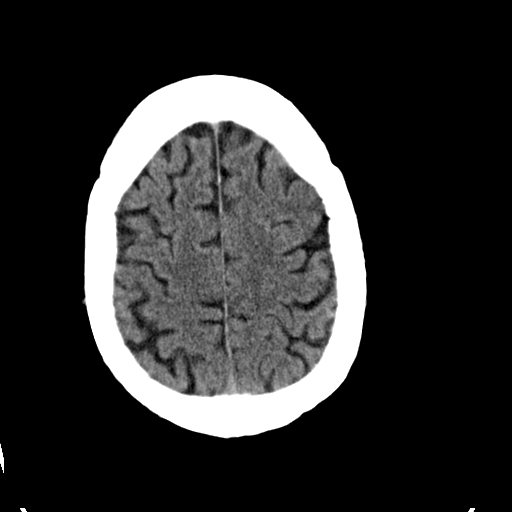
[im 26/31  brain]
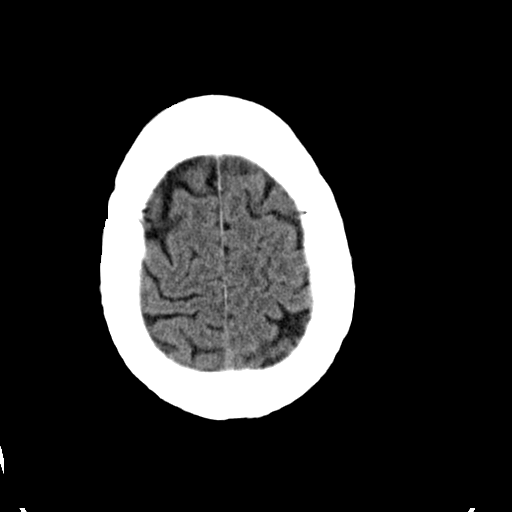
[im 28/31  brain]
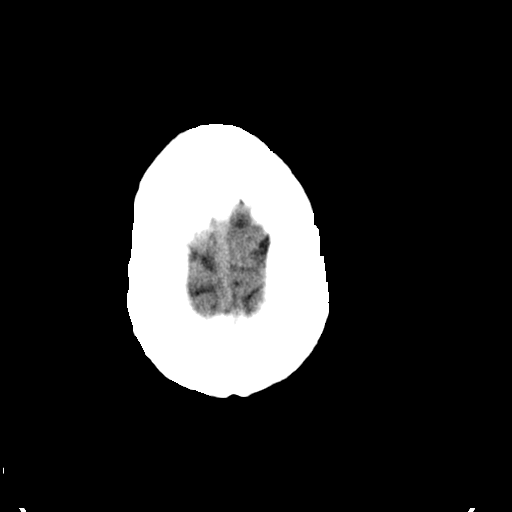
[im 28/31  bone]
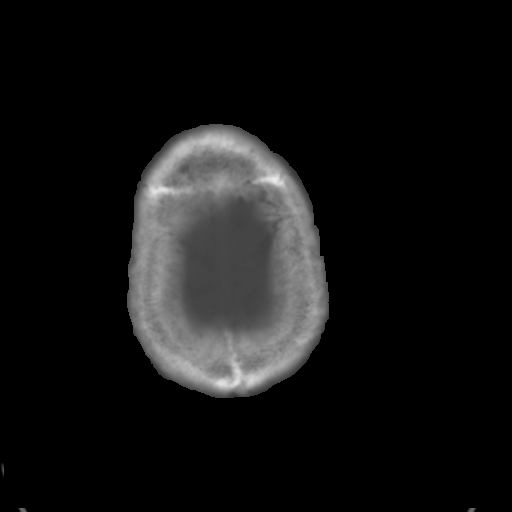

[Series 3: bone · axial · 0.44mm/px · z∈[+380,+420]mm · 3 of 31 slices shown]
[im 3/31  bone]
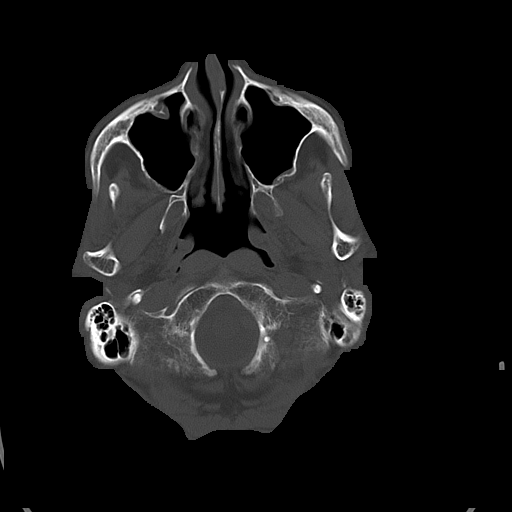
[im 7/31  bone]
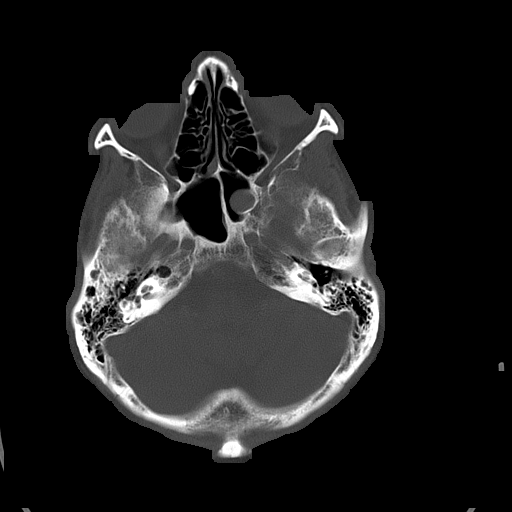
[im 11/31  bone]
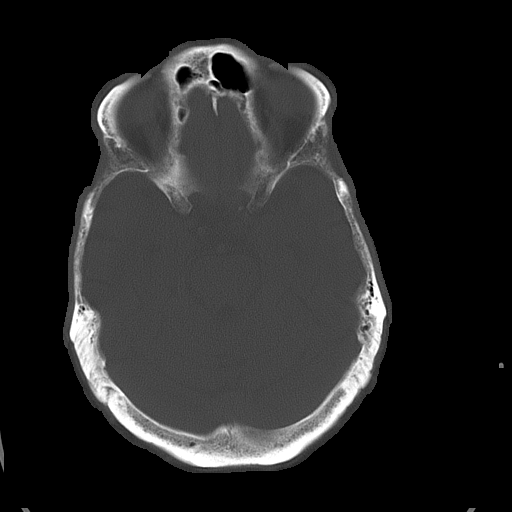

[16 of 30 positions shown; findings below may reference images not displayed]

There is no evidence of intra-axial nor intra-axial fluid collections nor
evidence of acute hemorrhage. A focal area of low-attenuation projects lung
external capsule on the left consistent with an area of chronic infarction.
There is diffuse cortical atrophy an areas of low attenuation within the
subcortical, deep, and periventricular white matter regions. There is no
evidence of mass effect. The ventricles and cisterns are patent. There is no
evidence of a depressed skull fracture.
IMPRESSION: Involutional and chronic changes without evidence of acute
abnormalities.

## 2010-11-15 LAB — COMPREHENSIVE METABOLIC PANEL
ALT: 12 U/L (ref 0–53)
ALT: 16 U/L (ref 0–53)
Alkaline Phosphatase: 63 U/L (ref 39–117)
Alkaline Phosphatase: 66 U/L (ref 39–117)
BUN: 13 mg/dL (ref 6–23)
CO2: 30 mEq/L (ref 19–32)
CO2: 29 mEq/L (ref 19–32)
Calcium: 8.7 mg/dL (ref 8.4–10.5)
Calcium: 8.7 mg/dL (ref 8.4–10.5)
GFR calc non Af Amer: >60 mL/min (ref >60)
GFR calc non Af Amer: >60 mL/min (ref >60)
Glucose, Bld: 78 mg/dL (ref 70–99)
Glucose, Bld: 148 mg/dL — ABNORMAL HIGH (ref 70–99)
Potassium: 3.6 mEq/L (ref 3.5–5.1)
Sodium: 140 mEq/L (ref 135–145)
Sodium: 144 mEq/L (ref 135–145)
Total Bilirubin: 0.6 mg/dL (ref 0.3–1.2)

## 2010-11-15 LAB — BASIC METABOLIC PANEL
BUN: 8 mg/dL (ref 6–23)
Chloride: 108 mEq/L (ref 96–112)
Glucose, Bld: 91 mg/dL (ref 70–99)
Potassium: 3.7 mEq/L (ref 3.5–5.1)

## 2010-11-15 LAB — DIFFERENTIAL
Basophils Absolute: 0 10*3/uL (ref 0.0–0.1)
Basophils Relative: 0 % (ref 0–1)
Basophils Relative: 0 % (ref 0–1)
Eosinophils Absolute: 0.1 10*3/uL (ref 0.0–0.7)
Eosinophils Absolute: 0 10*3/uL (ref 0.0–0.7)
Lymphs Abs: 1.7 10*3/uL (ref 0.7–4.0)
Neutrophils Relative %: 64 % (ref 43–77)
Neutrophils Relative %: 85 % — ABNORMAL HIGH (ref 43–77)

## 2010-11-15 LAB — CBC
HCT: 35.7 % — ABNORMAL LOW (ref 39.0–52.0)
HCT: 39.2 % (ref 39.0–52.0)
Hemoglobin: 12.1 g/dL — ABNORMAL LOW (ref 13.0–17.0)
Hemoglobin: 13.3 g/dL (ref 13.0–17.0)
MCHC: 33.9 g/dL (ref 30.0–36.0)
MCHC: 33.9 g/dL (ref 30.0–36.0)
MCV: 89.9 fL (ref 78.0–100.0)

## 2010-11-15 LAB — POCT I-STAT, CHEM 8
BUN: 12 mg/dL (ref 6–23)
Calcium, Ion: 1 mmol/L — ABNORMAL LOW (ref 1.12–1.32)
Creatinine, Ser: 0.9 mg/dL (ref 0.4–1.5)
Glucose, Bld: 96 mg/dL (ref 70–99)
TCO2: 31 mmol/L (ref 0–100)

## 2010-11-15 LAB — LIPASE, BLOOD
Lipase: 28 U/L (ref 11–59)
Lipase: 22 U/L (ref 11–59)

## 2010-11-15 LAB — POCT CARDIAC MARKERS

## 2010-11-15 LAB — ETHANOL
Alcohol, Ethyl (B): <5 mg/dL (ref 0–10)
Alcohol, Ethyl (B): 13 mg/dL — ABNORMAL HIGH (ref 0–10)
Alcohol, Ethyl (B): 329 mg/dL — ABNORMAL HIGH (ref 0–10)

## 2010-11-18 LAB — URINALYSIS, ROUTINE W REFLEX MICROSCOPIC
Glucose, UA: NEGATIVE mg/dL
Glucose, UA: NEGATIVE mg/dL
Nitrite: NEGATIVE
Protein, ur: NEGATIVE mg/dL
Specific Gravity, Urine: 1.021 (ref 1.005–1.030)
Specific Gravity, Urine: 1.02 (ref 1.005–1.030)
Urobilinogen, UA: 0.2 mg/dL (ref 0.0–1.0)
pH: 8.5 — ABNORMAL HIGH (ref 5.0–8.0)

## 2010-11-18 LAB — POCT I-STAT, CHEM 8
BUN: 16 mg/dL (ref 6–23)
Hemoglobin: 15.3 g/dL (ref 13.0–17.0)
Potassium: 3.8 mEq/L (ref 3.5–5.1)
Sodium: 144 mEq/L (ref 135–145)
TCO2: 24 mmol/L (ref 0–100)

## 2010-11-18 LAB — CBC
HCT: 39.2 % (ref 39.0–52.0)
HCT: 41.2 % (ref 39.0–52.0)
Hemoglobin: 13.4 g/dL (ref 13.0–17.0)
Hemoglobin: 14 g/dL (ref 13.0–17.0)
MCH: 33.4 pg (ref 26.0–34.0)
MCHC: 33.6 g/dL (ref 30.0–36.0)
MCV: 97.7 fL (ref 78.0–100.0)
Platelets: 337 10*3/uL (ref 150–400)
Platelets: 232 10*3/uL (ref 150–400)
RBC: 4.02 MIL/uL — ABNORMAL LOW (ref 4.22–5.81)
RBC: 4.2 MIL/uL — ABNORMAL LOW (ref 4.22–5.81)
RBC: 4.18 MIL/uL — ABNORMAL LOW (ref 4.22–5.81)
RDW: 14.7 % (ref 11.5–15.5)
WBC: 7.1 10*3/uL (ref 4.0–10.5)
WBC: 6.2 10*3/uL (ref 4.0–10.5)

## 2010-11-18 LAB — COMPREHENSIVE METABOLIC PANEL
ALT: 61 U/L — ABNORMAL HIGH (ref 0–53)
ALT: 38 U/L (ref 0–53)
AST: 24 U/L (ref 0–37)
Albumin: 3.4 g/dL — ABNORMAL LOW (ref 3.5–5.2)
Alkaline Phosphatase: 86 U/L (ref 39–117)
BUN: 12 mg/dL (ref 6–23)
CO2: 28 mEq/L (ref 19–32)
Calcium: 8.8 mg/dL (ref 8.4–10.5)
Chloride: 101 mEq/L (ref 96–112)
Chloride: 101 mEq/L (ref 96–112)
Creatinine, Ser: 0.76 mg/dL (ref 0.4–1.5)
GFR calc Af Amer: >60 mL/min (ref >60)
GFR calc non Af Amer: >60 mL/min (ref >60)
Glucose, Bld: 103 mg/dL — ABNORMAL HIGH (ref 70–99)
Potassium: 4.6 mEq/L (ref 3.5–5.1)
Sodium: 139 mEq/L (ref 135–145)
Sodium: 137 mEq/L (ref 135–145)
Total Bilirubin: 0.4 mg/dL (ref 0.3–1.2)
Total Bilirubin: 0.4 mg/dL (ref 0.3–1.2)

## 2010-11-18 LAB — DIFFERENTIAL
Basophils Absolute: 0 10*3/uL (ref 0.0–0.1)
Basophils Absolute: 0.1 10*3/uL (ref 0.0–0.1)
Basophils Relative: 0 % (ref 0–1)
Basophils Relative: 1 % (ref 0–1)
Eosinophils Absolute: 0 10*3/uL (ref 0.0–0.7)
Eosinophils Absolute: 0 10*3/uL (ref 0.0–0.7)
Eosinophils Relative: 0 % (ref 0–5)
Lymphocytes Relative: 35 % (ref 12–46)
Lymphocytes Relative: 39 % (ref 12–46)
Lymphs Abs: 2.4 10*3/uL (ref 0.7–4.0)
Monocytes Absolute: 0.9 10*3/uL (ref 0.1–1.0)
Monocytes Absolute: 0.5 10*3/uL (ref 0.1–1.0)
Neutro Abs: 4.2 10*3/uL (ref 1.7–7.7)
Neutro Abs: 4.6 10*3/uL (ref 1.7–7.7)
Neutrophils Relative %: 56 % (ref 43–77)
Neutrophils Relative %: 59 % (ref 43–77)

## 2010-11-18 LAB — RAPID URINE DRUG SCREEN, HOSP PERFORMED
Barbiturates: NOT DETECTED
Barbiturates: NOT DETECTED
Barbiturates: NOT DETECTED
Benzodiazepines: NOT DETECTED
Cocaine: NOT DETECTED
Opiates: NOT DETECTED

## 2010-11-18 LAB — POCT CARDIAC MARKERS: Troponin i, poc: <0.05 ng/mL (ref 0.00–0.09)

## 2010-11-18 LAB — ETHANOL
Alcohol, Ethyl (B): 92 mg/dL — ABNORMAL HIGH (ref 0–10)
Alcohol, Ethyl (B): 25 mg/dL — ABNORMAL HIGH (ref 0–10)

## 2010-11-19 LAB — DIFFERENTIAL
Basophils Absolute: 0.1 10*3/uL (ref 0.0–0.1)
Lymphocytes Relative: 29 % (ref 12–46)
Lymphs Abs: 2.5 10*3/uL (ref 0.7–4.0)
Monocytes Absolute: 0.7 10*3/uL (ref 0.1–1.0)
Neutro Abs: 5.4 10*3/uL (ref 1.7–7.7)

## 2010-11-19 LAB — GLUCOSE, CAPILLARY: Glucose-Capillary: 73 mg/dL (ref 70–99)

## 2010-11-19 LAB — CBC
HCT: 42.9 % (ref 39.0–52.0)
MCHC: 33.6 g/dL (ref 30.0–36.0)
MCV: 97.4 fL (ref 78.0–100.0)
Platelets: 370 10*3/uL (ref 150–400)
RDW: 14.5 % (ref 11.5–15.5)

## 2010-11-19 LAB — COMPREHENSIVE METABOLIC PANEL
Albumin: 3.5 g/dL (ref 3.5–5.2)
BUN: 15 mg/dL (ref 6–23)
Calcium: 8.8 mg/dL (ref 8.4–10.5)
Chloride: 95 mEq/L — ABNORMAL LOW (ref 96–112)
Creatinine, Ser: 1.08 mg/dL (ref 0.4–1.5)
GFR calc Af Amer: >60 mL/min (ref >60)
Total Bilirubin: 1 mg/dL (ref 0.3–1.2)
Total Protein: 7 g/dL (ref 6.0–8.3)

## 2010-11-19 LAB — LIPASE, BLOOD: Lipase: 26 U/L (ref 11–59)

## 2010-11-25 LAB — COMPREHENSIVE METABOLIC PANEL
Alkaline Phosphatase: 141 U/L — ABNORMAL HIGH (ref 39–117)
BUN: 8 mg/dL (ref 6–23)
Calcium: 8.4 mg/dL (ref 8.4–10.5)
Glucose, Bld: 77 mg/dL (ref 70–99)
Total Protein: 6.5 g/dL (ref 6.0–8.3)

## 2010-11-25 LAB — CBC
HCT: 37.2 % — ABNORMAL LOW (ref 39.0–52.0)
Hemoglobin: 12.7 g/dL — ABNORMAL LOW (ref 13.0–17.0)
MCHC: 34.2 g/dL (ref 30.0–36.0)
MCV: 97.7 fL (ref 78.0–100.0)
RDW: 13.9 % (ref 11.5–15.5)

## 2010-11-25 LAB — URINALYSIS, ROUTINE W REFLEX MICROSCOPIC
Glucose, UA: >1000 mg/dL — AB
Hgb urine dipstick: NEGATIVE
Ketones, ur: 15 mg/dL — AB
Protein, ur: 30 mg/dL — AB

## 2010-11-25 LAB — DIFFERENTIAL
Basophils Relative: 2 % — ABNORMAL HIGH (ref 0–1)
Lymphs Abs: 1.9 10*3/uL (ref 0.7–4.0)
Monocytes Relative: 7 % (ref 3–12)
Neutro Abs: 2.8 10*3/uL (ref 1.7–7.7)
Neutrophils Relative %: 54 % (ref 43–77)

## 2010-11-25 LAB — URINE MICROSCOPIC-ADD ON

## 2010-11-25 LAB — URINE CULTURE

## 2010-12-05 LAB — URINALYSIS, ROUTINE W REFLEX MICROSCOPIC
Ketones, ur: NEGATIVE mg/dL
Nitrite: NEGATIVE
Specific Gravity, Urine: 1.02 (ref 1.005–1.030)
Urobilinogen, UA: 4 mg/dL — ABNORMAL HIGH (ref 0.0–1.0)
pH: 6.5 (ref 5.0–8.0)

## 2010-12-05 LAB — COMPREHENSIVE METABOLIC PANEL
BUN: 12 mg/dL (ref 6–23)
CO2: 31 mEq/L (ref 19–32)
Calcium: 9.1 mg/dL (ref 8.4–10.5)
Creatinine, Ser: 0.95 mg/dL (ref 0.4–1.5)
GFR calc non Af Amer: >60 mL/min (ref >60)
Glucose, Bld: 93 mg/dL (ref 70–99)
Total Bilirubin: 0.8 mg/dL (ref 0.3–1.2)

## 2010-12-05 LAB — DIFFERENTIAL
Basophils Absolute: 0 10*3/uL (ref 0.0–0.1)
Lymphocytes Relative: 23 % (ref 12–46)
Lymphs Abs: 1.5 10*3/uL (ref 0.7–4.0)
Neutrophils Relative %: 71 % (ref 43–77)

## 2010-12-05 LAB — CBC
HCT: 39.3 % (ref 39.0–52.0)
Hemoglobin: 13.5 g/dL (ref 13.0–17.0)
MCHC: 34.3 g/dL (ref 30.0–36.0)
MCV: 100.1 fL — ABNORMAL HIGH (ref 78.0–100.0)
RBC: 3.92 MIL/uL — ABNORMAL LOW (ref 4.22–5.81)

## 2010-12-05 LAB — LIPASE, BLOOD: Lipase: 15 U/L (ref 11–59)

## 2011-04-16 ENCOUNTER — Emergency Department (HOSPITAL_COMMUNITY): Payer: Self-pay

## 2011-04-16 ENCOUNTER — Emergency Department (HOSPITAL_COMMUNITY)
Admission: EM | Admit: 2011-04-16 | Discharge: 2011-04-16 | Disposition: A | Discharge status: Home / Self Care | Payer: Self-pay | Attending: Emergency Medicine | Admitting: Emergency Medicine

## 2011-04-16 DIAGNOSIS — X58XXXA Exposure to other specified factors, initial encounter: Secondary | ICD-10-CM | POA: Insufficient documentation

## 2011-04-16 DIAGNOSIS — M79609 Pain in unspecified limb: Secondary | ICD-10-CM | POA: Insufficient documentation

## 2011-04-16 DIAGNOSIS — S63279A Dislocation of unspecified interphalangeal joint of unspecified finger, initial encounter: Secondary | ICD-10-CM | POA: Insufficient documentation

## 2011-04-16 DIAGNOSIS — F1010 Alcohol abuse, uncomplicated: Secondary | ICD-10-CM | POA: Insufficient documentation

## 2011-04-16 DIAGNOSIS — M7989 Other specified soft tissue disorders: Secondary | ICD-10-CM | POA: Insufficient documentation

## 2011-04-16 LAB — RAPID URINE DRUG SCREEN, HOSP PERFORMED
Barbiturates: NOT DETECTED
Benzodiazepines: NOT DETECTED
Cocaine: NOT DETECTED
Opiates: NOT DETECTED

## 2011-04-16 LAB — DIFFERENTIAL
Basophils Absolute: 0 10*3/uL (ref 0.0–0.1)
Basophils Relative: 0 % (ref 0–1)
Eosinophils Relative: 0 % (ref 0–5)
Lymphocytes Relative: 21 % (ref 12–46)
Monocytes Absolute: 0.7 10*3/uL (ref 0.1–1.0)
Neutro Abs: 7.9 10*3/uL — ABNORMAL HIGH (ref 1.7–7.7)

## 2011-04-16 LAB — BASIC METABOLIC PANEL
Calcium: 9.4 mg/dL (ref 8.4–10.5)
GFR calc Af Amer: >60 mL/min (ref >60)
GFR calc non Af Amer: >60 mL/min (ref >60)
Glucose, Bld: 82 mg/dL (ref 70–99)
Potassium: 4.4 mEq/L (ref 3.5–5.1)
Sodium: 144 mEq/L (ref 135–145)

## 2011-04-16 LAB — CBC
HCT: 43.5 % (ref 39.0–52.0)
MCHC: 34.9 g/dL (ref 30.0–36.0)
Platelets: 310 10*3/uL (ref 150–400)
RDW: 13.8 % (ref 11.5–15.5)
WBC: 11 10*3/uL — ABNORMAL HIGH (ref 4.0–10.5)

## 2011-04-16 LAB — HEPATIC FUNCTION PANEL
ALT: 16 U/L (ref 0–53)
AST: 22 U/L (ref 0–37)
Alkaline Phosphatase: 113 U/L (ref 39–117)
Bilirubin, Direct: 0.1 mg/dL (ref 0.0–0.3)
Indirect Bilirubin: 0.5 mg/dL (ref 0.3–0.9)
Total Bilirubin: 0.6 mg/dL (ref 0.3–1.2)

## 2011-04-16 LAB — ETHANOL: Alcohol, Ethyl (B): 124 mg/dL — ABNORMAL HIGH (ref 0–11)

## 2011-04-16 IMAGING — CR DG FINGER LITTLE 2+V*R*
4 series · 4 of 4 positions shown · non-contrast
Comparison: Earlier the same date.

CLINICAL DATA: Postreduction.

RIGHT LITTLE FINGER 2+V

[x finger pa right (1 of 2)]
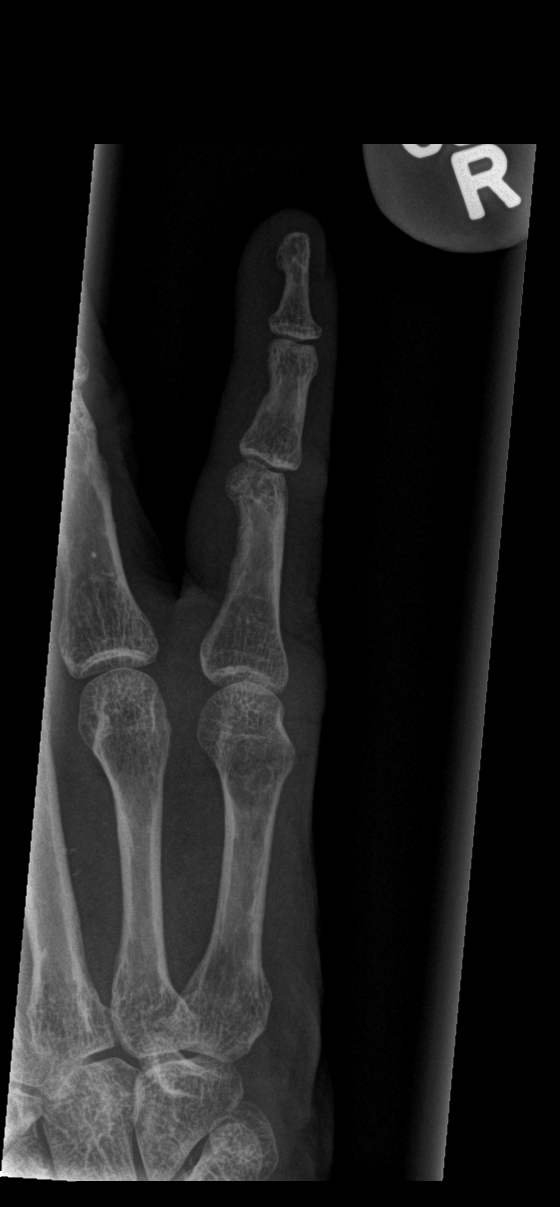

[x finger obl right]
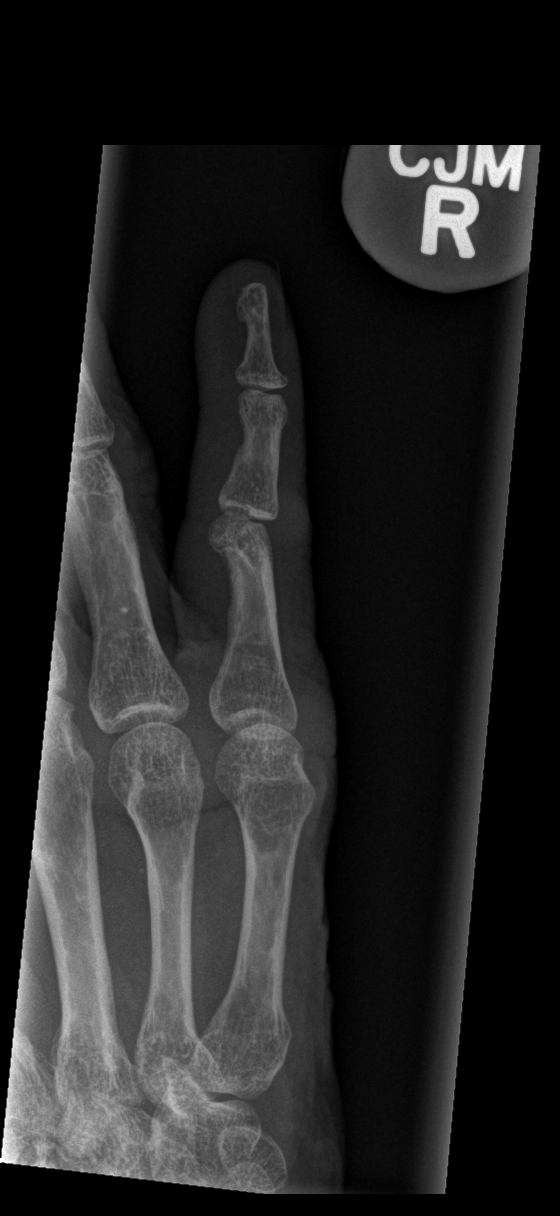

[x finger lat right]
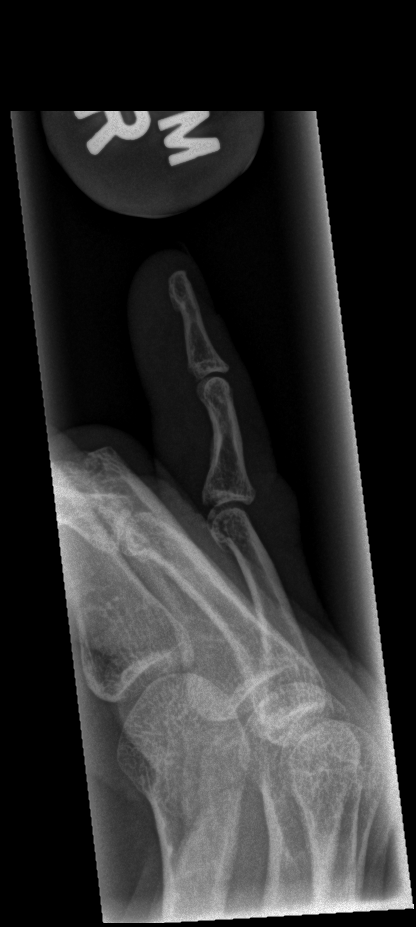

[x finger pa right (2 of 2)]
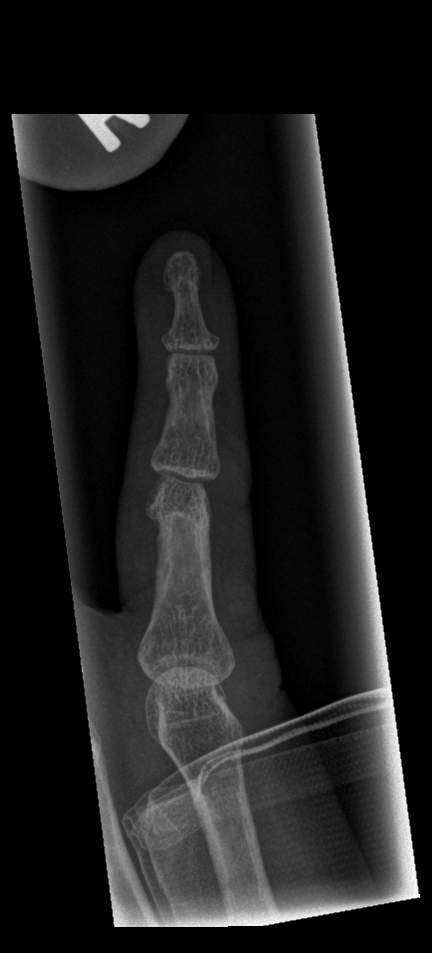

[4 of 4 positions shown; findings below may reference images not displayed]

FINDINGS: [4U] hours.  Dislocated proximal interphalangeal joint
has been reduced.  Based on the lateral view, there is a probable
small impaction fracture involving the volar base of the middle
phalanx.  No displaced fracture fragment is identified.
IMPRESSION: Reduced dislocation of the proximal interphalangeal joint of the
right small finger.  Probable small impaction fracture involving
the volar base of the middle phalanx.

## 2011-04-16 IMAGING — CR DG FINGER LITTLE 2+V*R*
3 series · 3 of 3 positions shown · non-contrast
Comparison: None.

CLINICAL DATA: Pain and swelling in the small finger of the right
hand.

RIGHT LITTLE FINGER 2+V

[x finger pa right]
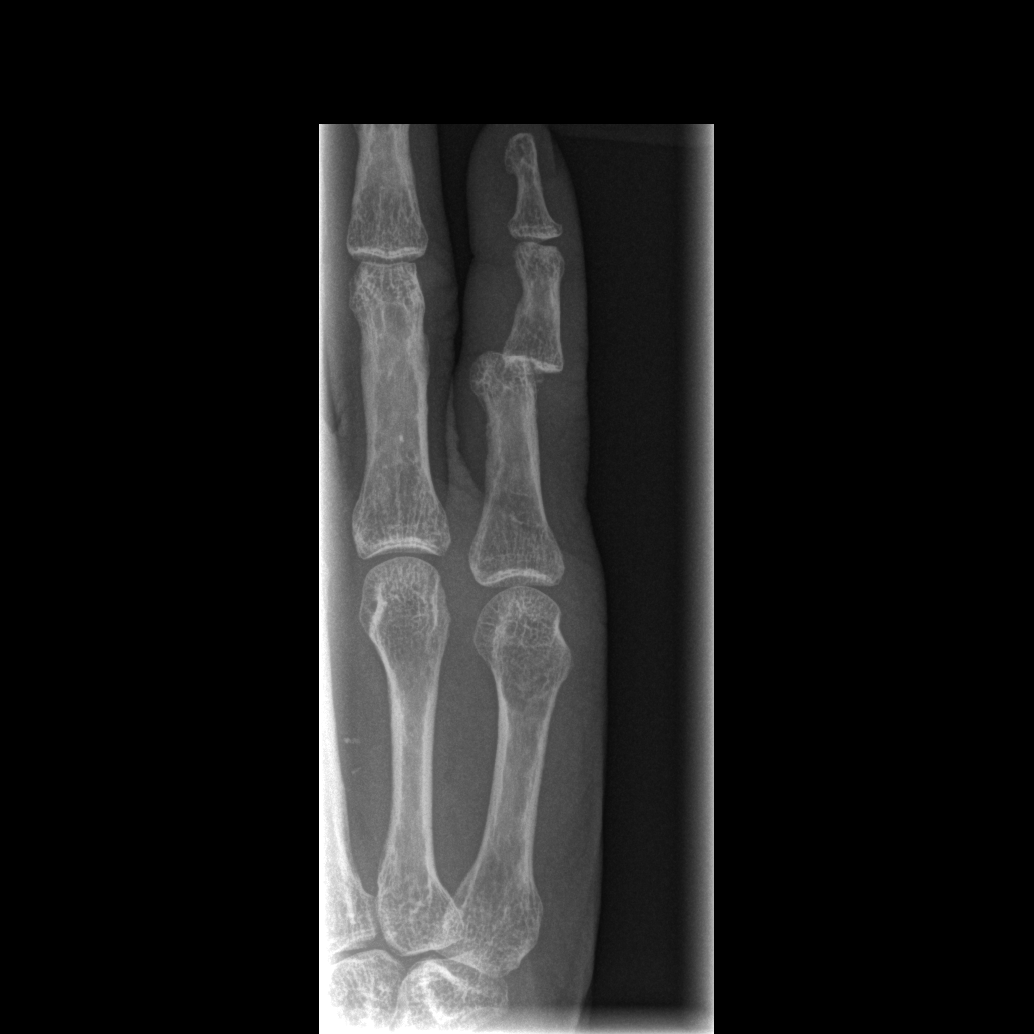

[x finger obl. right]
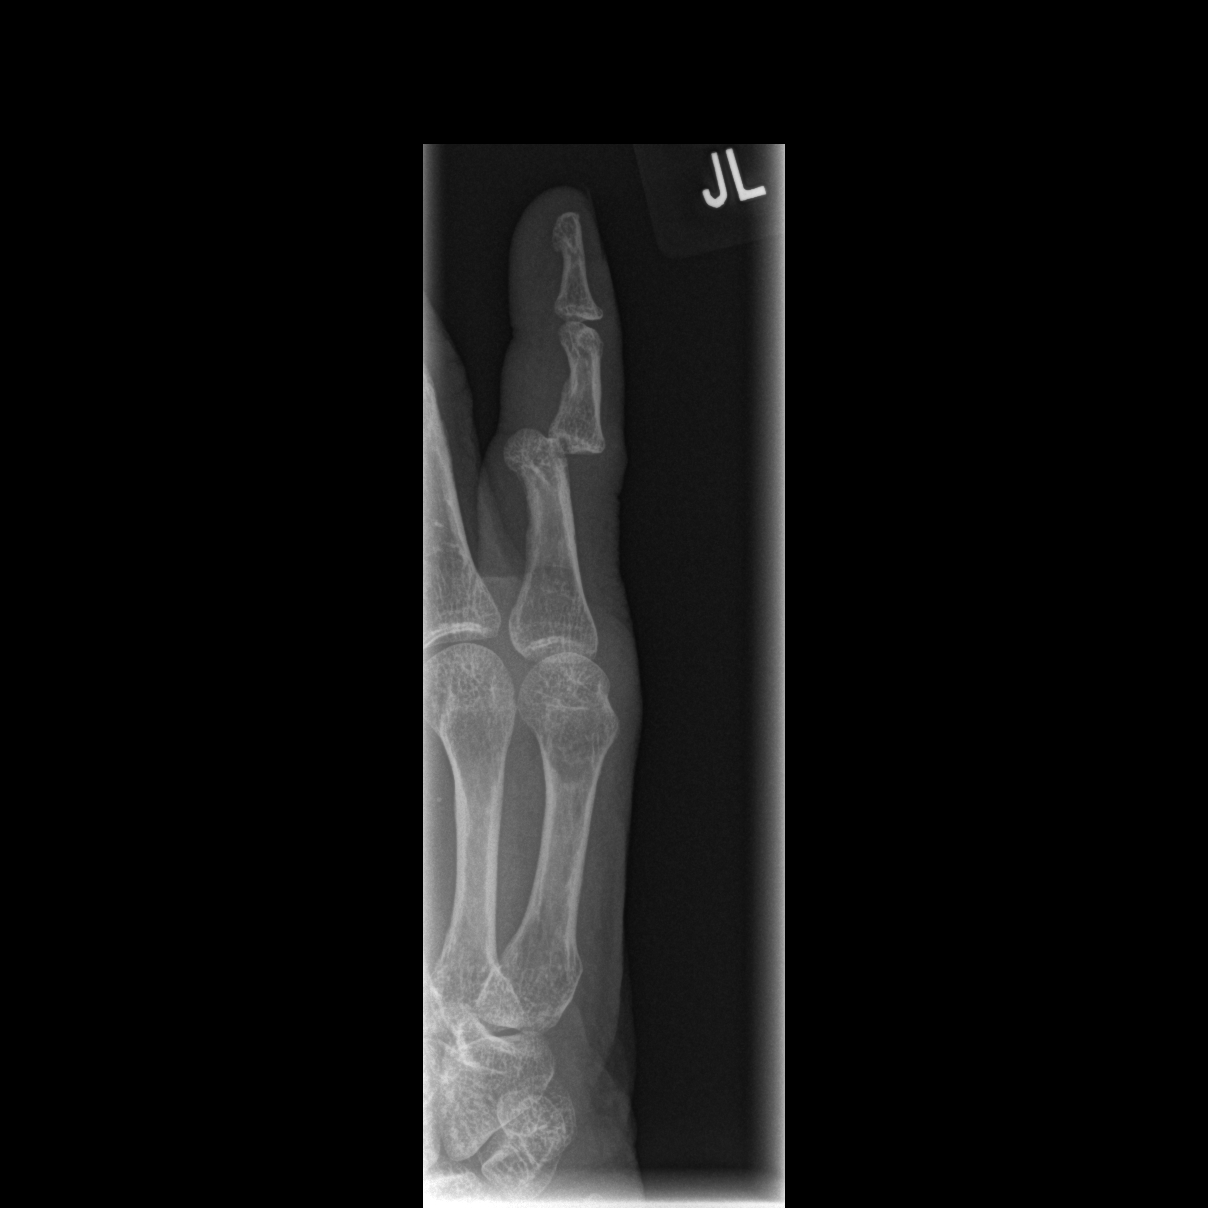

[x finger lateral right]
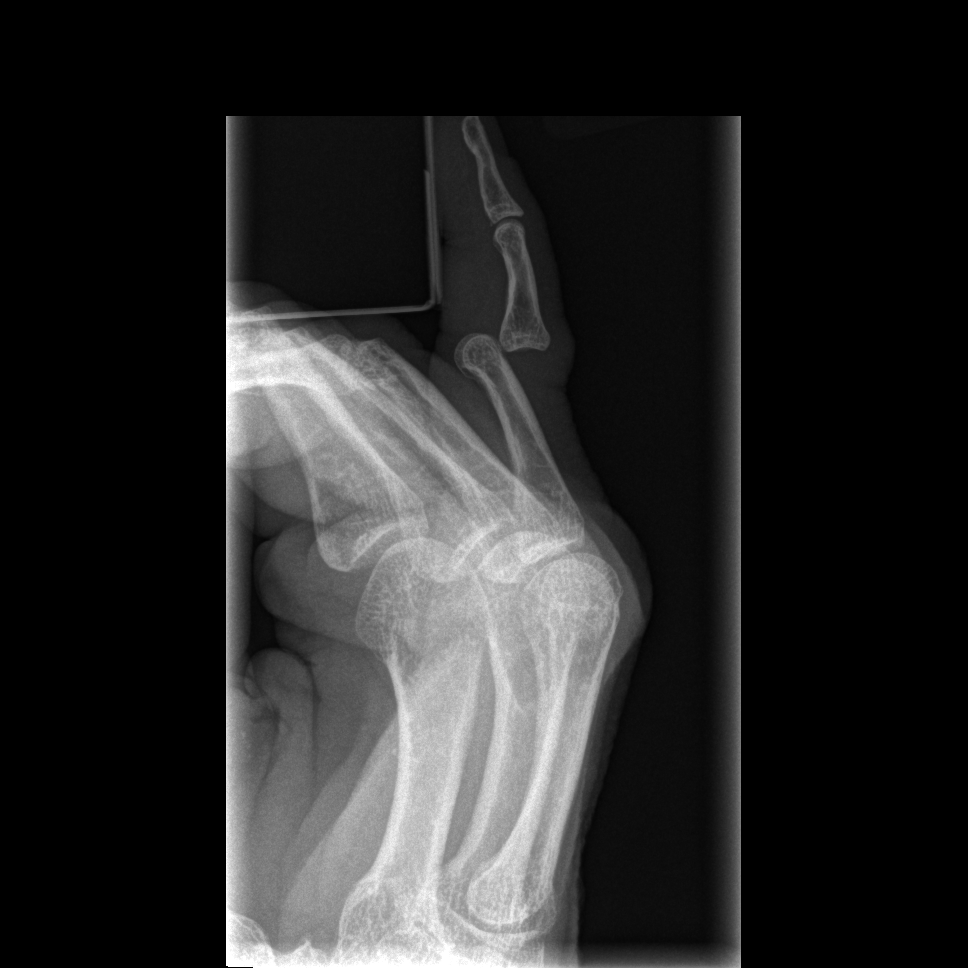

[3 of 3 positions shown; findings below may reference images not displayed]

FINDINGS: There is dorsal and ulnar dislocation at the proximal
interphalangeal joint of the small finger.  There is a possible
small impaction fracture of the base of the middle phalanx.
Generalized osteopenia is noted.
IMPRESSION: Dislocation at the proximal interphalangeal joint of the small
finger as described.  Possible impaction fracture of the middle
phalangeal base.  Postreduction views recommended.

## 2011-06-11 LAB — I-STAT 8, (EC8 V) (CONVERTED LAB)
BUN: 9
Bicarbonate: 30.4 — ABNORMAL HIGH
Chloride: 100
Glucose, Bld: 108 — ABNORMAL HIGH
HCT: 47
Operator id: 294501
pCO2, Ven: 46.6

## 2011-06-13 ENCOUNTER — Emergency Department (HOSPITAL_COMMUNITY)
Admission: EM | Admit: 2011-06-13 | Discharge: 2011-06-14 | Disposition: A | Discharge status: Home / Self Care | Payer: Self-pay | Attending: Emergency Medicine | Admitting: Emergency Medicine

## 2011-06-13 DIAGNOSIS — Z5900 Homelessness unspecified: Secondary | ICD-10-CM | POA: Insufficient documentation

## 2011-06-13 DIAGNOSIS — F1010 Alcohol abuse, uncomplicated: Secondary | ICD-10-CM | POA: Insufficient documentation

## 2011-06-13 LAB — COMPREHENSIVE METABOLIC PANEL
ALT: 10 U/L (ref 0–53)
Alkaline Phosphatase: 61 U/L (ref 39–117)
BUN: 17 mg/dL (ref 6–23)
CO2: 28 mEq/L (ref 19–32)
GFR calc Af Amer: >90 mL/min (ref >90)
GFR calc non Af Amer: 89 mL/min — ABNORMAL LOW (ref >90)
Glucose, Bld: 124 mg/dL — ABNORMAL HIGH (ref 70–99)
Potassium: 3.7 mEq/L (ref 3.5–5.1)
Sodium: 139 mEq/L (ref 135–145)

## 2011-06-13 LAB — ETHANOL: Alcohol, Ethyl (B): <11 mg/dL (ref 0–11)

## 2011-06-13 LAB — CBC
HCT: 33.9 % — ABNORMAL LOW (ref 39.0–52.0)
Hemoglobin: 11.5 g/dL — ABNORMAL LOW (ref 13.0–17.0)
MCH: 30.7 pg (ref 26.0–34.0)
RBC: 3.74 MIL/uL — ABNORMAL LOW (ref 4.22–5.81)

## 2011-06-13 LAB — DIFFERENTIAL
Basophils Absolute: 0 10*3/uL (ref 0.0–0.1)
Basophils Relative: 0 % (ref 0–1)
Lymphocytes Relative: 20 % (ref 12–46)
Monocytes Relative: 12 % (ref 3–12)
Neutro Abs: 6.5 10*3/uL (ref 1.7–7.7)
Neutrophils Relative %: 68 % (ref 43–77)

## 2011-06-13 LAB — RAPID URINE DRUG SCREEN, HOSP PERFORMED: Barbiturates: NOT DETECTED

## 2011-09-07 ENCOUNTER — Emergency Department (HOSPITAL_COMMUNITY)
Admission: EM | Admit: 2011-09-07 | Discharge: 2011-09-07 | Disposition: A | Discharge status: Home / Self Care | Payer: Self-pay | Attending: Emergency Medicine | Admitting: Emergency Medicine

## 2011-09-07 ENCOUNTER — Emergency Department (HOSPITAL_COMMUNITY): Payer: Self-pay

## 2011-09-07 ENCOUNTER — Encounter: Payer: Self-pay | Admitting: *Deleted

## 2011-09-07 DIAGNOSIS — M79673 Pain in unspecified foot: Secondary | ICD-10-CM

## 2011-09-07 DIAGNOSIS — M79609 Pain in unspecified limb: Secondary | ICD-10-CM | POA: Insufficient documentation

## 2011-09-07 DIAGNOSIS — M79643 Pain in unspecified hand: Secondary | ICD-10-CM

## 2011-09-07 IMAGING — CR DG FOOT COMPLETE 3+V*L*
3 series · 3 of 3 positions shown · non-contrast
Comparison: None.

CLINICAL DATA: Left foot pain, no trauma

LEFT FOOT - COMPLETE 3+ VIEW

[x foot ap left]
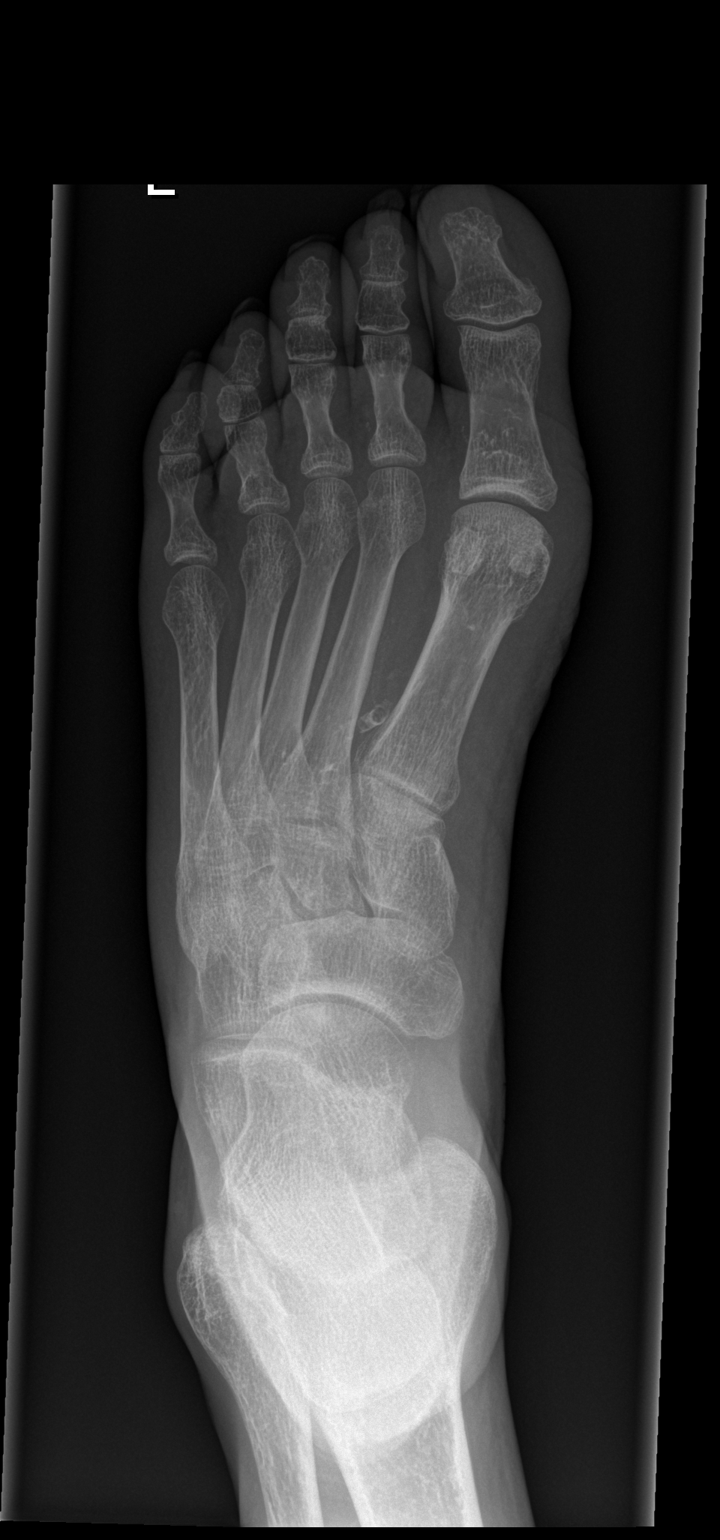

[x foot obl left]
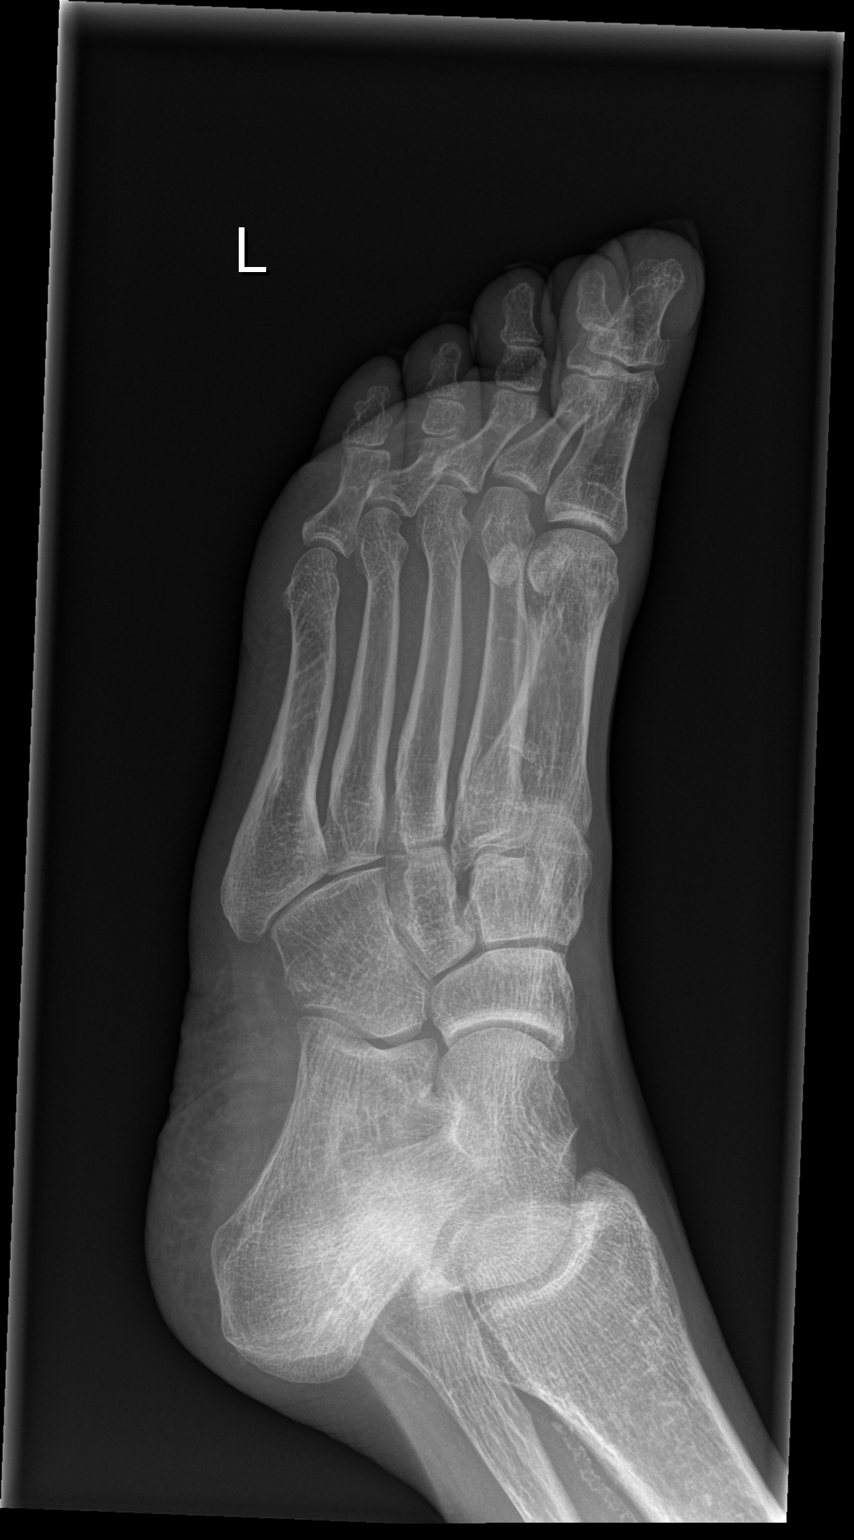

[x foot lat left]
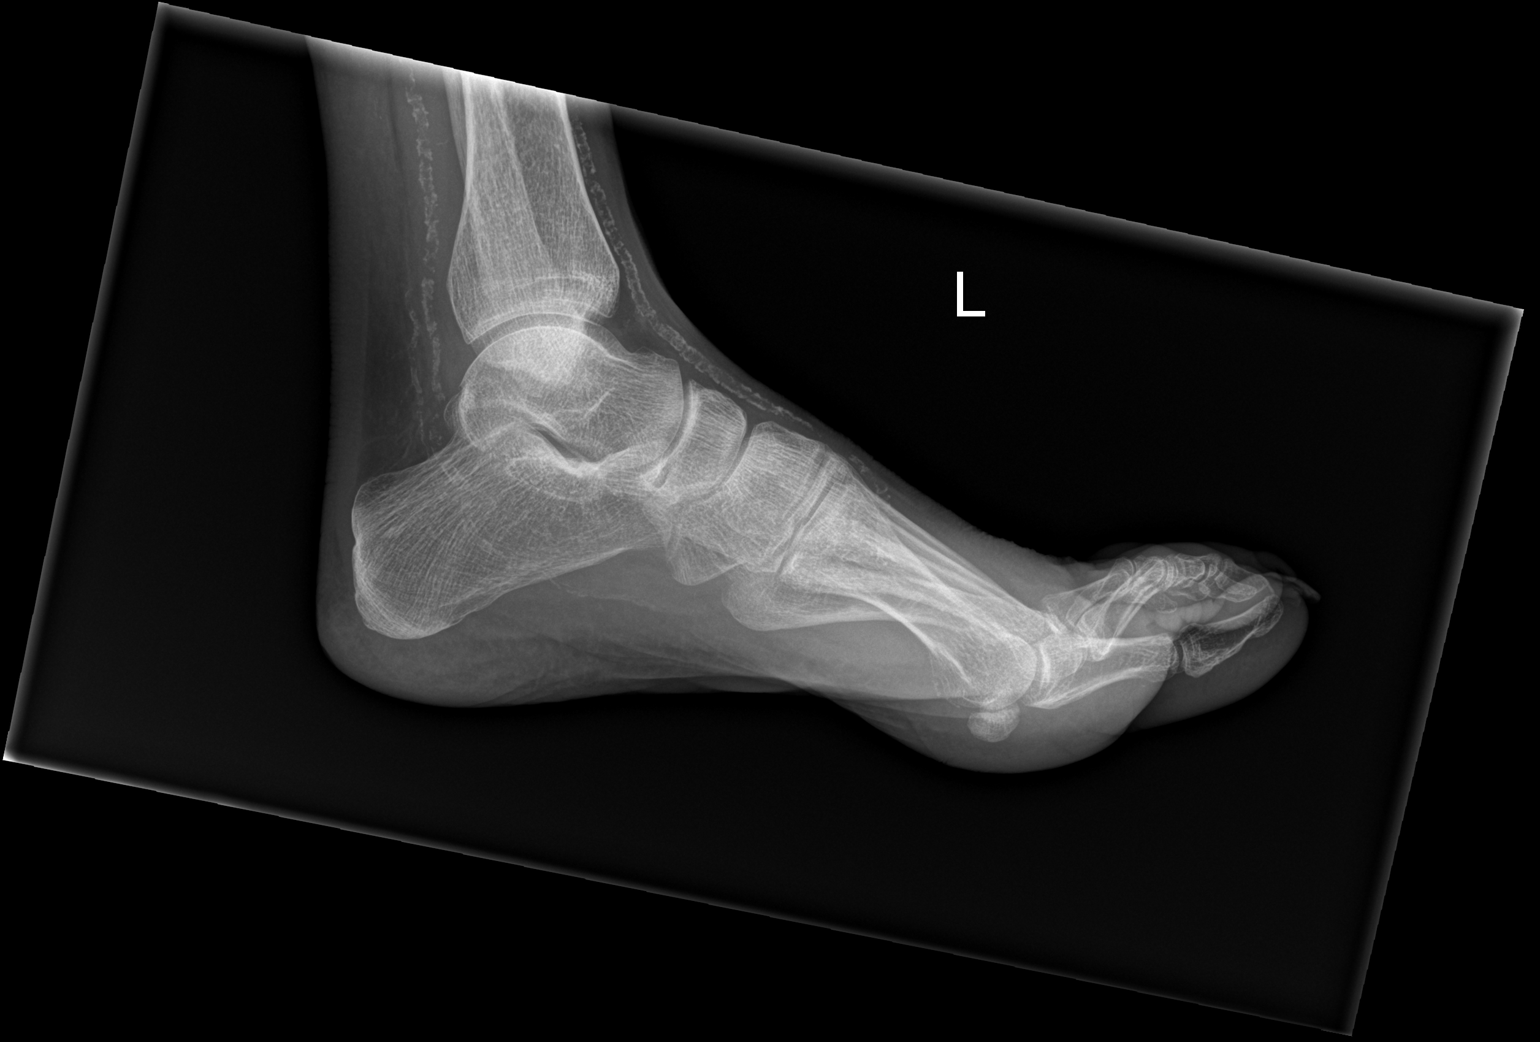

[3 of 3 positions shown; findings below may reference images not displayed]

FINDINGS: No fracture or dislocation is seen.

The joint spaces are preserved.

The visualized soft tissues are unremarkable.

Vascular calcifications.
IMPRESSION: No acute osseous abnormality is seen.

## 2011-09-07 IMAGING — CR DG HAND COMPLETE 3+V*R*
3 series · 3 of 3 positions shown · non-contrast
Comparison: None

CLINICAL DATA: Right hand pain.

RIGHT HAND - COMPLETE 3+ VIEW

[x hand pa right]
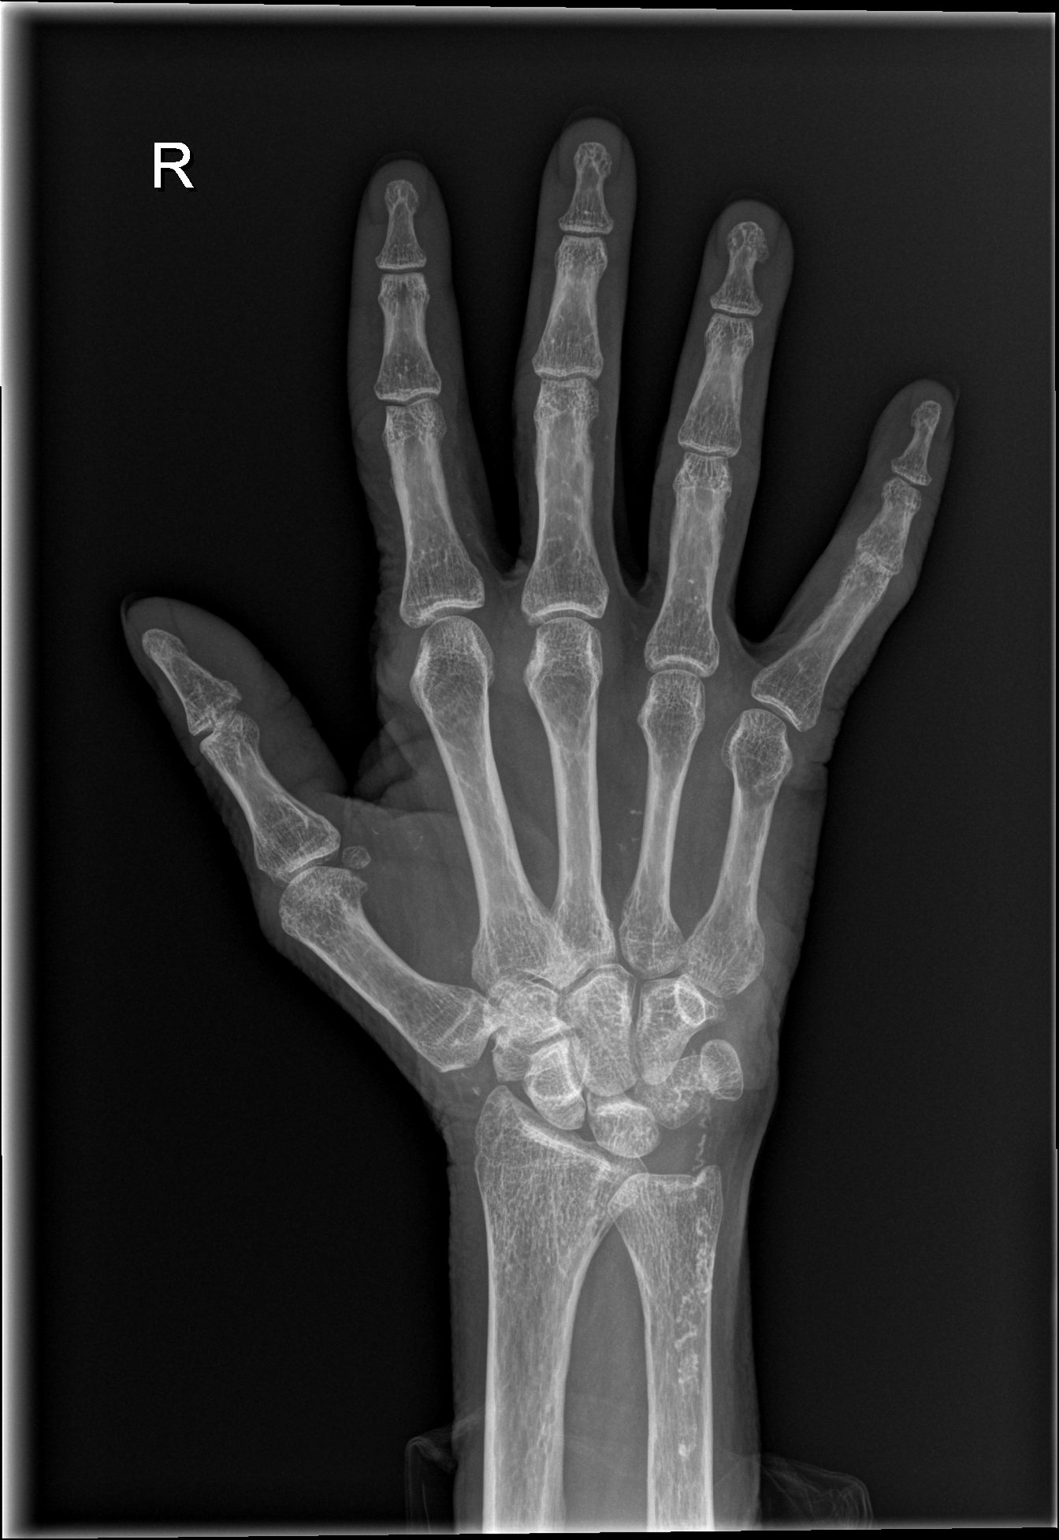

[x hand obl right]
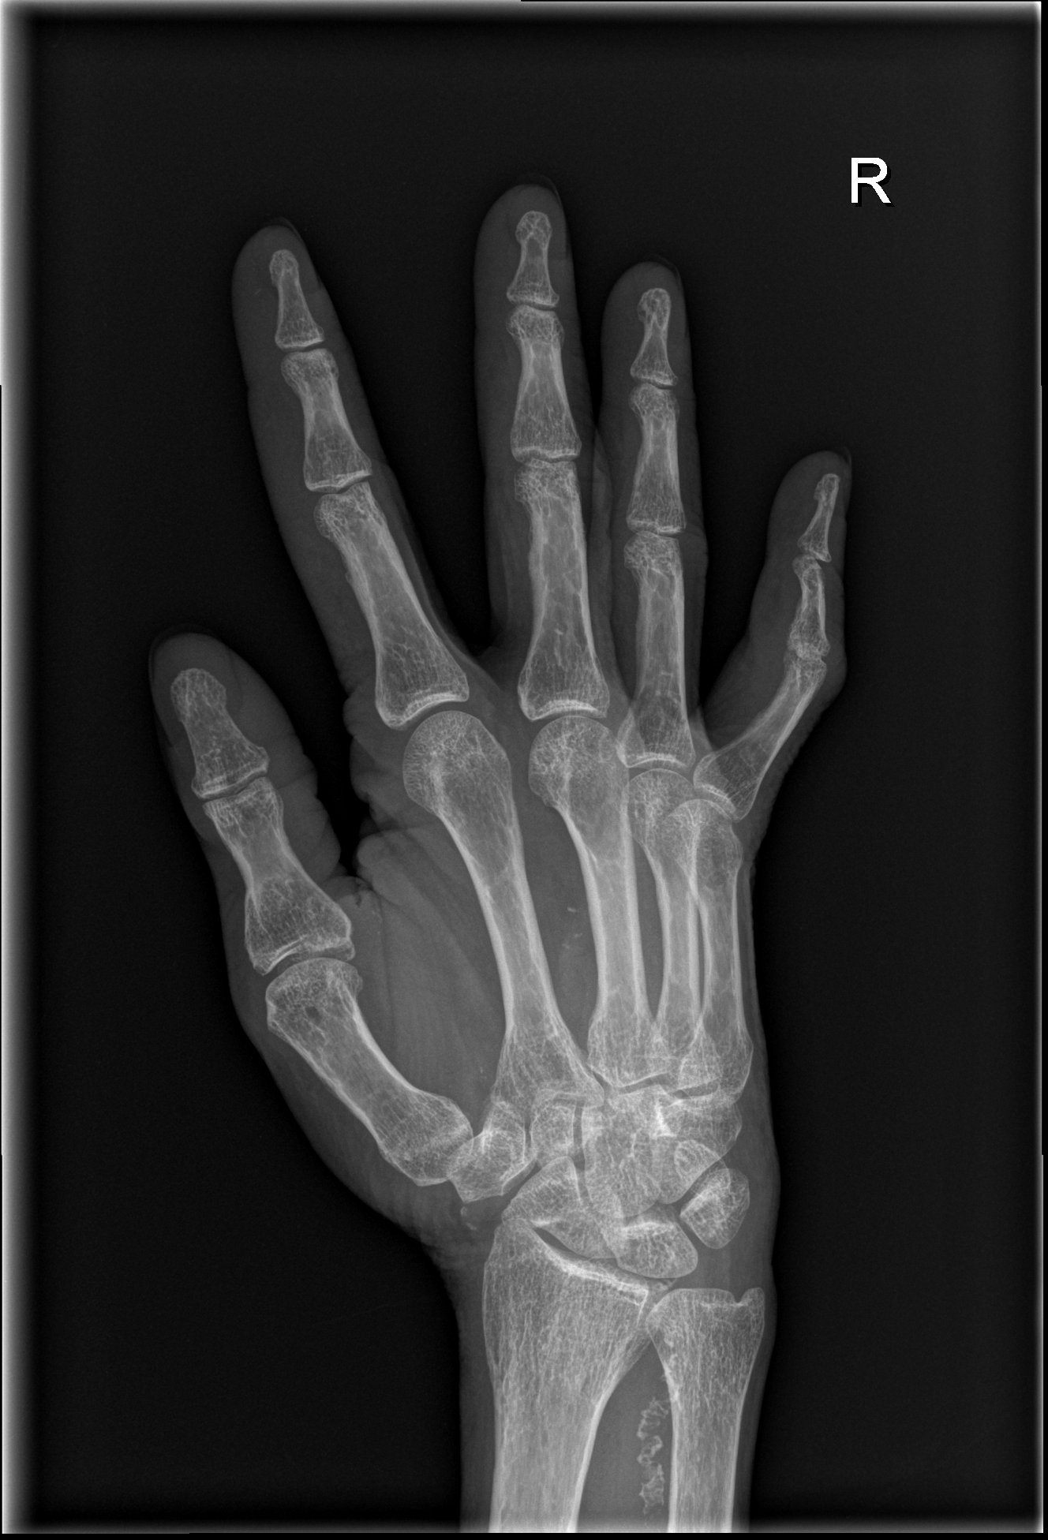

[x hand lat right]
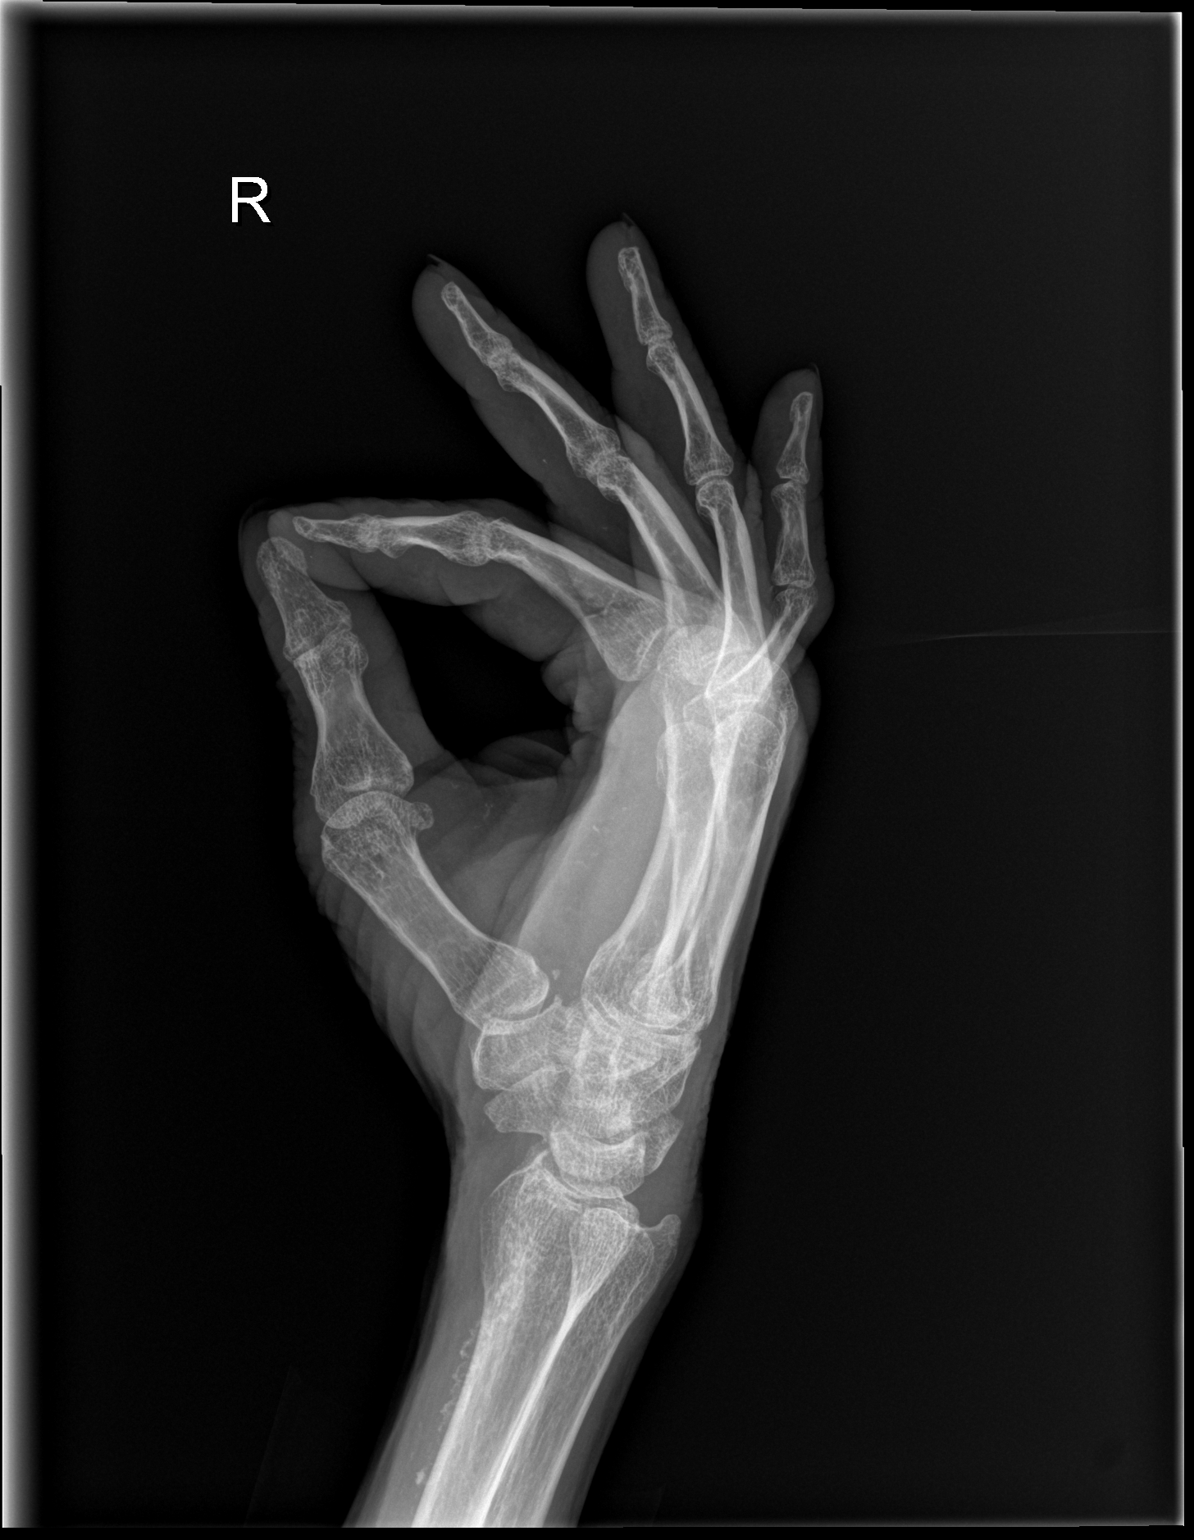

[3 of 3 positions shown; findings below may reference images not displayed]

FINDINGS: The joint spaces are maintained.  There are minimal
degenerative changes and moderate osteoporosis.  No acute fracture.
Vascular calcifications are noted.
IMPRESSION: No acute bony findings.

## 2011-09-07 MED ORDER — HYDROCODONE-ACETAMINOPHEN 5-325 MG PO TABS: 1.0000 | ORAL_TABLET | Freq: Four times a day (QID) | ORAL | Status: DC | PRN

## 2011-09-07 NOTE — ED Provider Notes (Signed)
History     [name]: [id number]  Arrival date & time 09/07/11  1025   First MD Initiated Contact with Patient 09/07/11 1237      Chief Complaint  Patient presents with  . Hand Pain  . Foot Pain    (Consider location/radiation/quality/duration/timing/severity/associated sxs/prior treatment) HPI This 66 year old male states he has a 2 to three-day history of constant gradual onset mild to moderate pain and tenderness to his right hand small finger at the PIP joint into his left forefoot with no redness no weakness no numbness no known trauma and no other concerns. His pain is constant there's been no treatment prior to arrival. His pain is nonradiating and burning in nature. He has not had pain like this before. He denies weakness numbness chest pain cough palpitations shortness of breath lightheadedness or incoordination. History reviewed. No pertinent past medical history.  History reviewed. No pertinent past surgical history.  History reviewed. No pertinent family history.  History  Substance Use Topics  . Smoking status: Never Smoker   . Smokeless tobacco: Not on file  . Alcohol Use: Yes     occ      Review of Systems  Constitutional: Negative for fever.       10 Systems reviewed and are negative for acute change except as noted in the HPI.  HENT: Negative for congestion.   Eyes: Negative for discharge and redness.  Respiratory: Negative for cough and shortness of breath.   Cardiovascular: Negative for chest pain.  Gastrointestinal: Negative for vomiting and abdominal pain.  Musculoskeletal: Negative for back pain.  Skin: Negative for rash.  Neurological: Negative for syncope, numbness and headaches.  Psychiatric/Behavioral:       No behavior change.    Allergies  Review of patient's allergies indicates no known allergies.  Home Medications   Current Outpatient Rx  Name Route Sig Dispense Refill  . HYDROCODONE-ACETAMINOPHEN 5-325 MG PO TABS Oral Take 1 tablet by  mouth every 6 (six) hours as needed for pain. 10 tablet 0    BP 174/93  Pulse 77  Temp(Src) 97.6 F (36.4 C) (Oral)  Resp 18  SpO2 98%  Physical Exam  Nursing note and vitals reviewed. Constitutional:       Awake, alert, nontoxic appearance with baseline speech for patient.  HENT:  Head: Atraumatic.  Mouth/Throat: No oropharyngeal exudate.  Eyes: EOM are normal. Pupils are equal, round, and reactive to light. Right eye exhibits no discharge. Left eye exhibits no discharge.  Neck: Neck supple.  Cardiovascular: Normal rate and regular rhythm.   No murmur heard. Pulmonary/Chest: Effort normal and breath sounds normal. No stridor. No respiratory distress. He has no wheezes. He has no rales. He exhibits no tenderness.  Abdominal: Soft. Bowel sounds are normal. He exhibits no mass. There is no tenderness. There is no rebound.  Musculoskeletal: He exhibits tenderness. He exhibits no edema.       Baseline ROM, moves extremities with no obvious new focal weakness.  Capillary refill is less than 2 seconds in all digits in his right hand and left foot. He has mild tenderness to his right hand small finger PIP joint only with slightly decreased extension at that joint otherwise no tenderness to his right hand small finger DIP joint or metacarpal phalangeal joint or the rest of his right hand or wrist or the rest of his right arm. His left foot has mild diffuse forefoot tenderness over the distal metatarsal plantar region without erythema or excessive warmth fluctuance color  change or decreased light touch. His left foot has normal light touch with capillary refill less than 2 seconds good range of motion active and passive with no apparent weakness. There is no apparent weakness to his right hand either.  Lymphadenopathy:    He has no cervical adenopathy.  Neurological: He is alert.       Awake, alert, cooperative and aware of situation; motor strength bilaterally; sensation normal to light touch  bilaterally; peripheral visual fields full to confrontation; no facial asymmetry; tongue midline; major cranial nerves appear intact; no pronator drift, normal finger to nose bilaterally  Skin: No rash noted.  Psychiatric: He has a normal mood and affect.    ED Course  Procedures (including critical care time)  Labs Reviewed - No data to display Dg Hand Complete Right  09/07/2011  *RADIOLOGY REPORT*  Clinical Data: Right hand pain.  RIGHT HAND - COMPLETE 3+ VIEW  Comparison: None  Findings: The joint spaces are maintained.  There are minimal degenerative changes and moderate osteoporosis.  No acute fracture. Vascular calcifications are noted.  IMPRESSION: No acute bony findings.  Original Report Authenticated By: P. Loralie Champagne, M.D.   Dg Foot Complete Left  09/07/2011  *RADIOLOGY REPORT*  Clinical Data: Left foot pain, no trauma  LEFT FOOT - COMPLETE 3+ VIEW  Comparison: None.  Findings: No fracture or dislocation is seen.  The joint spaces are preserved.  The visualized soft tissues are unremarkable.  Vascular calcifications.  IMPRESSION: No acute osseous abnormality is seen.  Original Report Authenticated By: Charline Bills, M.D.     1. Hand pain   2. Foot pain       MDM  Patient informed of clinical course, understand medical decision-making process, and agree with plan.  I doubt any other EMC precluding discharge at this time including, but not necessarily limited to the following:SBI, vascular compromise.        Hurman Horn, MD 09/07/11 [phone]

## 2011-09-07 NOTE — ED Notes (Signed)
Reports pain to bottom of left foot and right hand stiffness that started yesterday.

## 2011-09-09 ENCOUNTER — Encounter (HOSPITAL_COMMUNITY): Payer: Self-pay | Admitting: Emergency Medicine

## 2011-09-09 ENCOUNTER — Emergency Department (HOSPITAL_COMMUNITY)
Admission: EM | Admit: 2011-09-09 | Discharge: 2011-09-09 | Disposition: A | Discharge status: Home / Self Care | Payer: Self-pay | Attending: Emergency Medicine | Admitting: Emergency Medicine

## 2011-09-09 DIAGNOSIS — M79672 Pain in left foot: Secondary | ICD-10-CM

## 2011-09-09 DIAGNOSIS — M7989 Other specified soft tissue disorders: Secondary | ICD-10-CM | POA: Insufficient documentation

## 2011-09-09 DIAGNOSIS — R000 Tachycardia, unspecified: Secondary | ICD-10-CM | POA: Insufficient documentation

## 2011-09-09 DIAGNOSIS — R300 Dysuria: Secondary | ICD-10-CM

## 2011-09-09 DIAGNOSIS — M79609 Pain in unspecified limb: Secondary | ICD-10-CM | POA: Insufficient documentation

## 2011-09-09 LAB — URINALYSIS, ROUTINE W REFLEX MICROSCOPIC
Ketones, ur: NEGATIVE mg/dL
Leukocytes, UA: NEGATIVE
Nitrite: NEGATIVE
Specific Gravity, Urine: 1.018 (ref 1.005–1.030)
pH: 7 (ref 5.0–8.0)

## 2011-09-09 LAB — BASIC METABOLIC PANEL
Calcium: 8.7 mg/dL (ref 8.4–10.5)
GFR calc Af Amer: >90 mL/min (ref >90)
GFR calc non Af Amer: 85 mL/min — ABNORMAL LOW (ref >90)
Glucose, Bld: 87 mg/dL (ref 70–99)
Sodium: 141 mEq/L (ref 135–145)

## 2011-09-09 LAB — CBC
MCH: 30.6 pg (ref 26.0–34.0)
MCHC: 33.6 g/dL (ref 30.0–36.0)
MCV: 91.1 fL (ref 78.0–100.0)
Platelets: 395 10*3/uL (ref 150–400)
RDW: 14.4 % (ref 11.5–15.5)

## 2011-09-09 LAB — DIFFERENTIAL
Basophils Absolute: 0 10*3/uL (ref 0.0–0.1)
Basophils Relative: 0 % (ref 0–1)
Eosinophils Absolute: 0 10*3/uL (ref 0.0–0.7)
Eosinophils Relative: 0 % (ref 0–5)
Neutrophils Relative %: 65 % (ref 43–77)

## 2011-09-09 LAB — URIC ACID: Uric Acid, Serum: 5.1 mg/dL (ref 4.0–7.8)

## 2011-09-09 MED ORDER — NAPROXEN 500 MG PO TABS: 500.0000 mg | ORAL_TABLET | Freq: Two times a day (BID) | ORAL | Status: DC

## 2011-09-09 MED ORDER — IBUPROFEN 800 MG PO TABS
800.0000 mg | ORAL_TABLET | Freq: Once | ORAL | Status: AC
  Administered 2011-09-09: 800 mg via ORAL
  Filled 2011-09-09: qty 1

## 2011-09-09 NOTE — ED Notes (Signed)
Pt c/o burning and pain with urination x 2 days and left leg pain; pt denies obvious injury; pt denies obvious blood in urine; pt sts urine is darker in color

## 2011-09-09 NOTE — ED Provider Notes (Signed)
History     [name]: [id number]  Arrival date & time 09/09/11  1235   First MD Initiated Contact with Patient 09/09/11 1526      Chief Complaint  Patient presents with  . Urinary Tract Infection  . Leg Pain    (Consider location/radiation/quality/duration/timing/severity/associated sxs/prior treatment) Patient is a 66 y.o. male presenting with urinary tract infection and leg pain. The history is provided by the patient.  Urinary Tract Infection  Leg Pain   He  Says that he started having a dull aching pain in his left foot last night which got worse today. Pain is severe and he rates it at 10 out of 10. There is mild pain present in his left lower leg. Pain is worse when he walks. Nothing makes it any better. He has not taken any medication. Denies other injury. He he says he has not had any pain like this before. He is wondering whether he might have gout, but states he has not had gout before. He is also complaining that he has burning with urination and his urine is malodorous. This started today. He denies urinary frequency, urgency, tenesmus. He denies fever, chills, sweats.  History reviewed. No pertinent past medical history.  History reviewed. No pertinent past surgical history.  History reviewed. No pertinent family history.  History  Substance Use Topics  . Smoking status: Never Smoker   . Smokeless tobacco: Not on file  . Alcohol Use: Yes     occ      Review of Systems  All other systems reviewed and are negative.    Allergies  Review of patient's allergies indicates no known allergies.  Home Medications   Current Outpatient Rx  Name Route Sig Dispense Refill  . HYDROCODONE-ACETAMINOPHEN 5-325 MG PO TABS Oral Take 1 tablet by mouth every 6 (six) hours as needed. For leg pain       BP 128/84  Pulse 104  Temp(Src) 98 F (36.7 C) (Oral)  Resp 18  SpO2 97%  Physical Exam  Nursing note and vitals reviewed. 66 year old male is resting comfortably in no  acute distress. Vital signs show mild tachycardia heart rate of 104. Oxygen saturation is 97% which is normal. Head is normocephalic and atraumatic. PERRLA, EOMI. Neck is supple without adenopathy. Lungs are clear without rales, wheezes, rhonchi. Heart has a regular rate and rhythm without murmur. Back is nontender. There is no chest wall tenderness. Abdomen is soft, flat, nontender without masses or hepatosplenomegaly. Extremities there is no cyanosis or edema, full range of motion is present. There is minimal erythema of the left foot compared with the right and there is very minimal swelling of the toes of the left foot compared with the right. There is no warmth that would be suggestive of cellulitis and I can see no breaking the skin. There is mild tenderness palpation rather diffusely in the left foot. Pulses are strong and capillary refill is prompt. Skin is warm and moist without rash. Neurologic: Mental status is normal, cranial nerves are intact, there no focal motor or sensory deficits. Psychiatric: No abnormalities of mood or affect.   ED Course  Procedures (including critical care time)   Labs Reviewed  URINALYSIS, ROUTINE W REFLEX MICROSCOPIC  CBC  DIFFERENTIAL  BASIC METABOLIC PANEL  URIC ACID   No results found.  Results for orders placed during the hospital encounter of 09/09/11  URINALYSIS, ROUTINE W REFLEX MICROSCOPIC      Component Value Range   Color, Urine YELLOW  YELLOW    APPearance CLEAR  CLEAR    Specific Gravity, Urine 1.018  1.005 - 1.030    pH 7.0  5.0 - 8.0    Glucose, UA NEGATIVE  NEGATIVE (mg/dL)   Hgb urine dipstick NEGATIVE  NEGATIVE    Bilirubin Urine NEGATIVE  NEGATIVE    Ketones, ur NEGATIVE  NEGATIVE (mg/dL)   Protein, ur NEGATIVE  NEGATIVE (mg/dL)   Urobilinogen, UA 0.2  0.0 - 1.0 (mg/dL)   Nitrite NEGATIVE  NEGATIVE    Leukocytes, UA NEGATIVE  NEGATIVE   CBC      Component Value Range   WBC 10.3  4.0 - 10.5 (K/uL)   RBC 3.37 (*) 4.22 - 5.81  (MIL/uL)   Hemoglobin 10.3 (*) 13.0 - 17.0 (g/dL)   HCT 47.4 (*) 25.9 - 52.0 (%)   MCV 91.1  78.0 - 100.0 (fL)   MCH 30.6  26.0 - 34.0 (pg)   MCHC 33.6  30.0 - 36.0 (g/dL)   RDW 56.3  87.5 - 64.3 (%)   Platelets 395  150 - 400 (K/uL)  DIFFERENTIAL      Component Value Range   Neutrophils Relative 65  43 - 77 (%)   Neutro Abs 6.7  1.7 - 7.7 (K/uL)   Lymphocytes Relative 25  12 - 46 (%)   Lymphs Abs 2.6  0.7 - 4.0 (K/uL)   Monocytes Relative 10  3 - 12 (%)   Monocytes Absolute 1.0  0.1 - 1.0 (K/uL)   Eosinophils Relative 0  0 - 5 (%)   Eosinophils Absolute 0.0  0.0 - 0.7 (K/uL)   Basophils Relative 0  0 - 1 (%)   Basophils Absolute 0.0  0.0 - 0.1 (K/uL)  BASIC METABOLIC PANEL      Component Value Range   Sodium 141  135 - 145 (mEq/L)   Potassium 5.1  3.5 - 5.1 (mEq/L)   Chloride 105  96 - 112 (mEq/L)   CO2 31  19 - 32 (mEq/L)   Glucose, Bld 87  70 - 99 (mg/dL)   BUN 16  6 - 23 (mg/dL)   Creatinine, Ser 3.29  0.50 - 1.35 (mg/dL)   Calcium 8.7  8.4 - 51.8 (mg/dL)   GFR calc non Af Amer 85 (*) >90 (mL/min)   GFR calc Af Amer >90  >90 (mL/min)  URIC ACID      Component Value Range   Uric Acid, Serum 5.1  4.0 - 7.8 (mg/dL)   Dg Hand Complete Right  09/07/2011  *RADIOLOGY REPORT*  Clinical Data: Right hand pain.  RIGHT HAND - COMPLETE 3+ VIEW  Comparison: None  Findings: The joint spaces are maintained.  There are minimal degenerative changes and moderate osteoporosis.  No acute fracture. Vascular calcifications are noted.  IMPRESSION: No acute bony findings.  Original Report Authenticated By: P. Loralie Champagne, M.D.   Dg Foot Complete Left  09/07/2011  *RADIOLOGY REPORT*  Clinical Data: Left foot pain, no trauma  LEFT FOOT - COMPLETE 3+ VIEW  Comparison: None.  Findings: No fracture or dislocation is seen.  The joint spaces are preserved.  The visualized soft tissues are unremarkable.  Vascular calcifications.  IMPRESSION: No acute osseous abnormality is seen.  Original Report  Authenticated By: Charline Bills, M.D.     No diagnosis found.  Workup is essentially negative. WBC is not elevated and uric acid is in the low-normal range. I will give him a trial of NSAIDs and given prescription for Naprosyn 500  mg twice a day for 10 days. He had been given a prescription for Norco 2 days ago, so no additional narcotic prescriptions will be given.  MDM  Left foot pain which may represent early gout or early cellulitis. Curiously, he had been in the emergency department 2 days ago with complaints of left foot pain even though he states that the pain started last night. Urinalysis is no evidence of UTI. Etiology of his dysuria is unclear.        Dione Booze, MD 09/09/11 [phone]

## 2011-09-09 NOTE — ED Notes (Signed)
Pt states he has left leg pain hurts from foot to knee x 3 days denies injurty and it really burns to void he states also since friday

## 2011-09-16 ENCOUNTER — Emergency Department (HOSPITAL_COMMUNITY): Payer: Self-pay

## 2011-09-16 ENCOUNTER — Encounter (HOSPITAL_COMMUNITY): Payer: Self-pay | Admitting: *Deleted

## 2011-09-16 ENCOUNTER — Emergency Department (HOSPITAL_COMMUNITY)
Admission: EM | Admit: 2011-09-16 | Discharge: 2011-09-16 | Disposition: A | Discharge status: Home / Self Care | Payer: Self-pay | Attending: Emergency Medicine | Admitting: Emergency Medicine

## 2011-09-16 DIAGNOSIS — I10 Essential (primary) hypertension: Secondary | ICD-10-CM | POA: Insufficient documentation

## 2011-09-16 DIAGNOSIS — R000 Tachycardia, unspecified: Secondary | ICD-10-CM | POA: Insufficient documentation

## 2011-09-16 DIAGNOSIS — IMO0001 Reserved for inherently not codable concepts without codable children: Secondary | ICD-10-CM | POA: Insufficient documentation

## 2011-09-16 DIAGNOSIS — J159 Unspecified bacterial pneumonia: Secondary | ICD-10-CM | POA: Insufficient documentation

## 2011-09-16 DIAGNOSIS — J3489 Other specified disorders of nose and nasal sinuses: Secondary | ICD-10-CM | POA: Insufficient documentation

## 2011-09-16 DIAGNOSIS — R110 Nausea: Secondary | ICD-10-CM | POA: Insufficient documentation

## 2011-09-16 DIAGNOSIS — R05 Cough: Secondary | ICD-10-CM | POA: Insufficient documentation

## 2011-09-16 DIAGNOSIS — R059 Cough, unspecified: Secondary | ICD-10-CM | POA: Insufficient documentation

## 2011-09-16 DIAGNOSIS — R64 Cachexia: Secondary | ICD-10-CM | POA: Insufficient documentation

## 2011-09-16 DIAGNOSIS — R509 Fever, unspecified: Secondary | ICD-10-CM | POA: Insufficient documentation

## 2011-09-16 DIAGNOSIS — R300 Dysuria: Secondary | ICD-10-CM | POA: Insufficient documentation

## 2011-09-16 DIAGNOSIS — R109 Unspecified abdominal pain: Secondary | ICD-10-CM | POA: Insufficient documentation

## 2011-09-16 HISTORY — DX: Urinary tract infection, site not specified: N39.0

## 2011-09-16 HISTORY — DX: Essential (primary) hypertension: I10

## 2011-09-16 LAB — URINE CULTURE
Colony Count: NO GROWTH
Culture  Setup Time: 201301141918
Culture: NO GROWTH

## 2011-09-16 LAB — URINALYSIS, ROUTINE W REFLEX MICROSCOPIC
Glucose, UA: NEGATIVE mg/dL
Hgb urine dipstick: NEGATIVE
Ketones, ur: 15 mg/dL — AB
Leukocytes, UA: NEGATIVE
Nitrite: NEGATIVE
Protein, ur: 30 mg/dL — AB
Specific Gravity, Urine: 1.023 (ref 1.005–1.030)
Urobilinogen, UA: 1 mg/dL (ref 0.0–1.0)
pH: 6 (ref 5.0–8.0)

## 2011-09-16 LAB — URINE MICROSCOPIC-ADD ON

## 2011-09-16 LAB — CBC
HCT: 32.1 % — ABNORMAL LOW (ref 39.0–52.0)
Hemoglobin: 10.9 g/dL — ABNORMAL LOW (ref 13.0–17.0)
MCH: 30.4 pg (ref 26.0–34.0)
MCHC: 34 g/dL (ref 30.0–36.0)
MCV: 89.4 fL (ref 78.0–100.0)
Platelets: 407 10*3/uL — ABNORMAL HIGH (ref 150–400)
RBC: 3.59 MIL/uL — ABNORMAL LOW (ref 4.22–5.81)
RDW: 13.9 % (ref 11.5–15.5)
WBC: 19.3 10*3/uL — ABNORMAL HIGH (ref 4.0–10.5)

## 2011-09-16 LAB — DIFFERENTIAL
Basophils Absolute: 0 K/uL (ref 0.0–0.1)
Basophils Relative: 0 % (ref 0–1)
Eosinophils Absolute: 0 K/uL (ref 0.0–0.7)
Eosinophils Relative: 0 % (ref 0–5)
Lymphocytes Relative: 11 % — ABNORMAL LOW (ref 12–46)
Lymphs Abs: 2.2 10*3/uL (ref 0.7–4.0)
Monocytes Absolute: 1.6 K/uL — ABNORMAL HIGH (ref 0.1–1.0)
Monocytes Relative: 8 % (ref 3–12)
Neutro Abs: 15.5 K/uL — ABNORMAL HIGH (ref 1.7–7.7)
Neutrophils Relative %: 80 % — ABNORMAL HIGH (ref 43–77)

## 2011-09-16 LAB — BASIC METABOLIC PANEL WITH GFR
BUN: 12 mg/dL (ref 6–23)
Calcium: 8.8 mg/dL (ref 8.4–10.5)
Chloride: 100 meq/L (ref 96–112)
Creatinine, Ser: 0.67 mg/dL (ref 0.50–1.35)
GFR calc Af Amer: >90 mL/min (ref >90)
GFR calc non Af Amer: >90 mL/min (ref >90)

## 2011-09-16 LAB — BASIC METABOLIC PANEL
CO2: 27 mEq/L (ref 19–32)
Glucose, Bld: 106 mg/dL — ABNORMAL HIGH (ref 70–99)
Potassium: 3.7 mEq/L (ref 3.5–5.1)
Sodium: 138 mEq/L (ref 135–145)

## 2011-09-16 IMAGING — CR DG CHEST 2V
2 series · 2 of 2 positions shown · non-contrast
Comparison: [DATE]

CLINICAL DATA: Cough, shortness of breath, history hypertension

CHEST - 2 VIEW

[w chest pa]
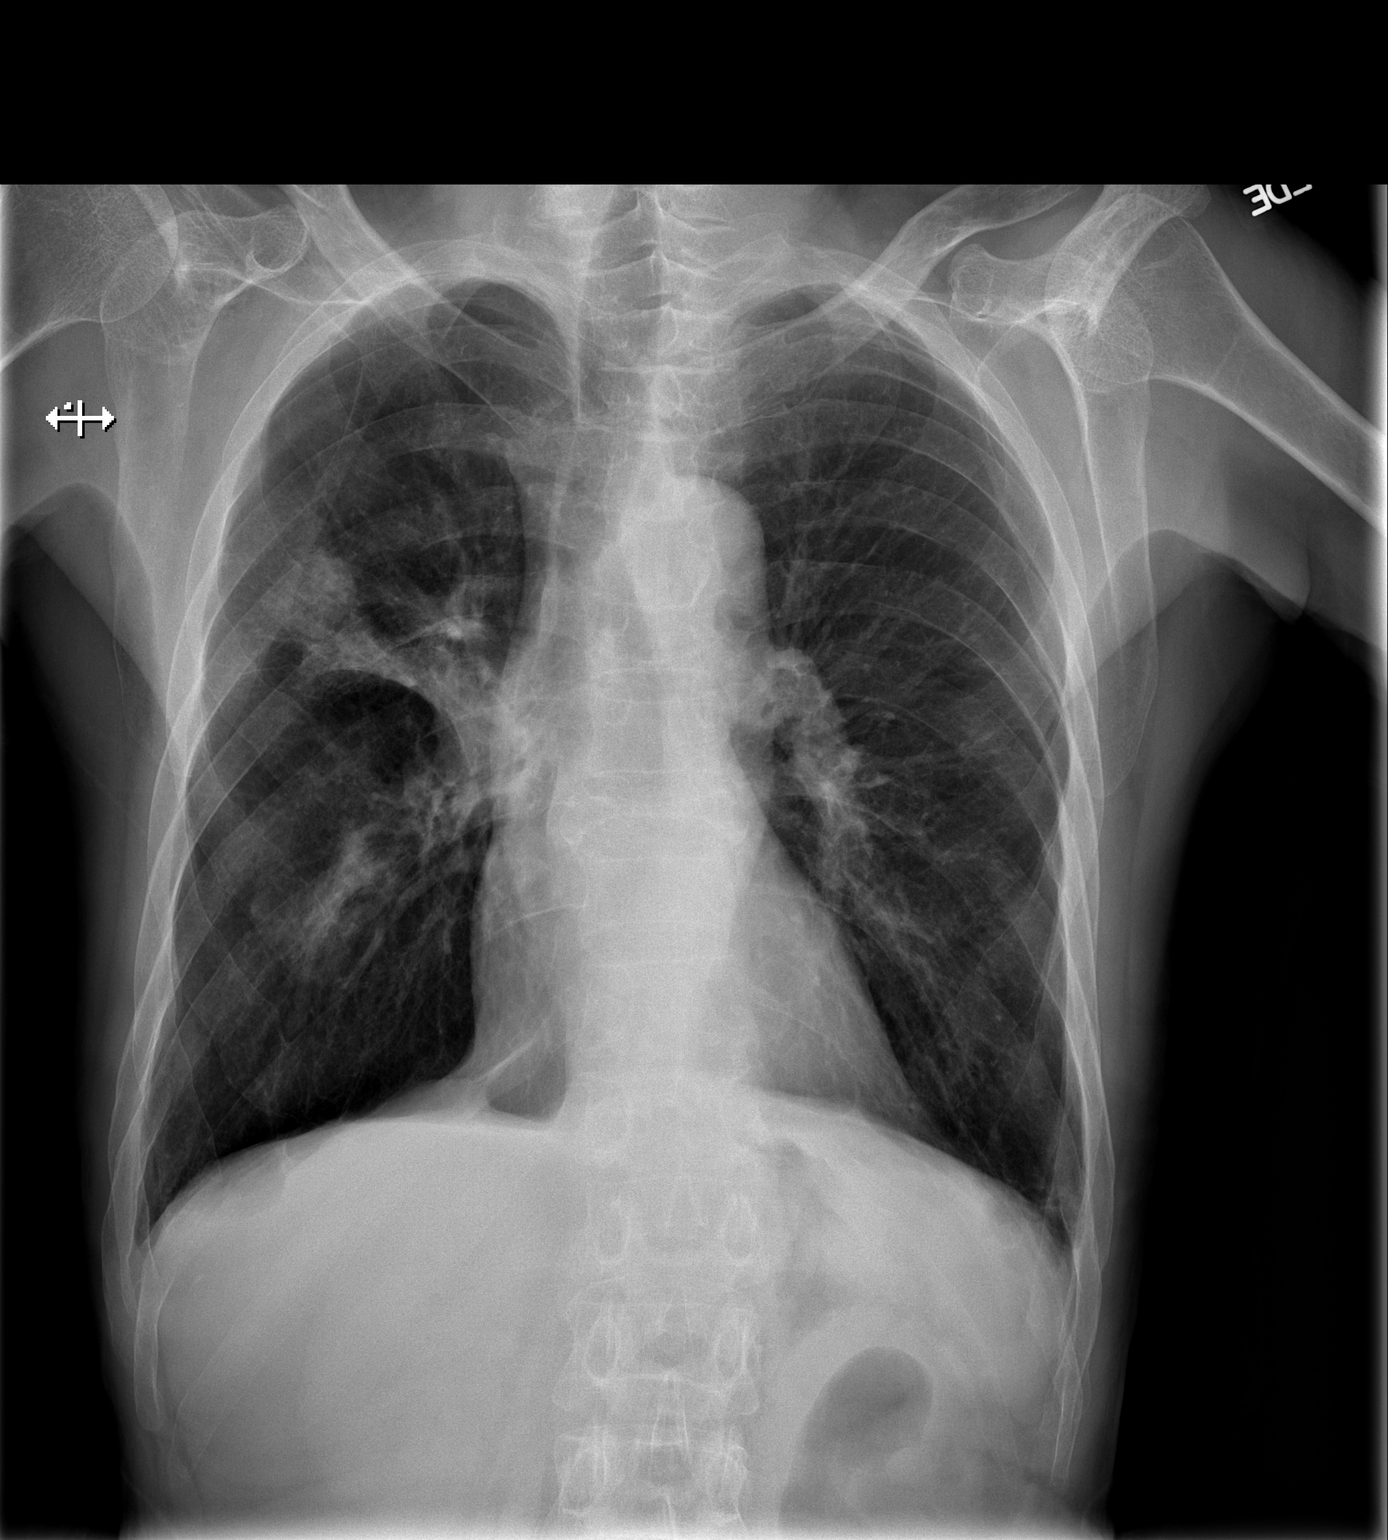

[w chest lat]
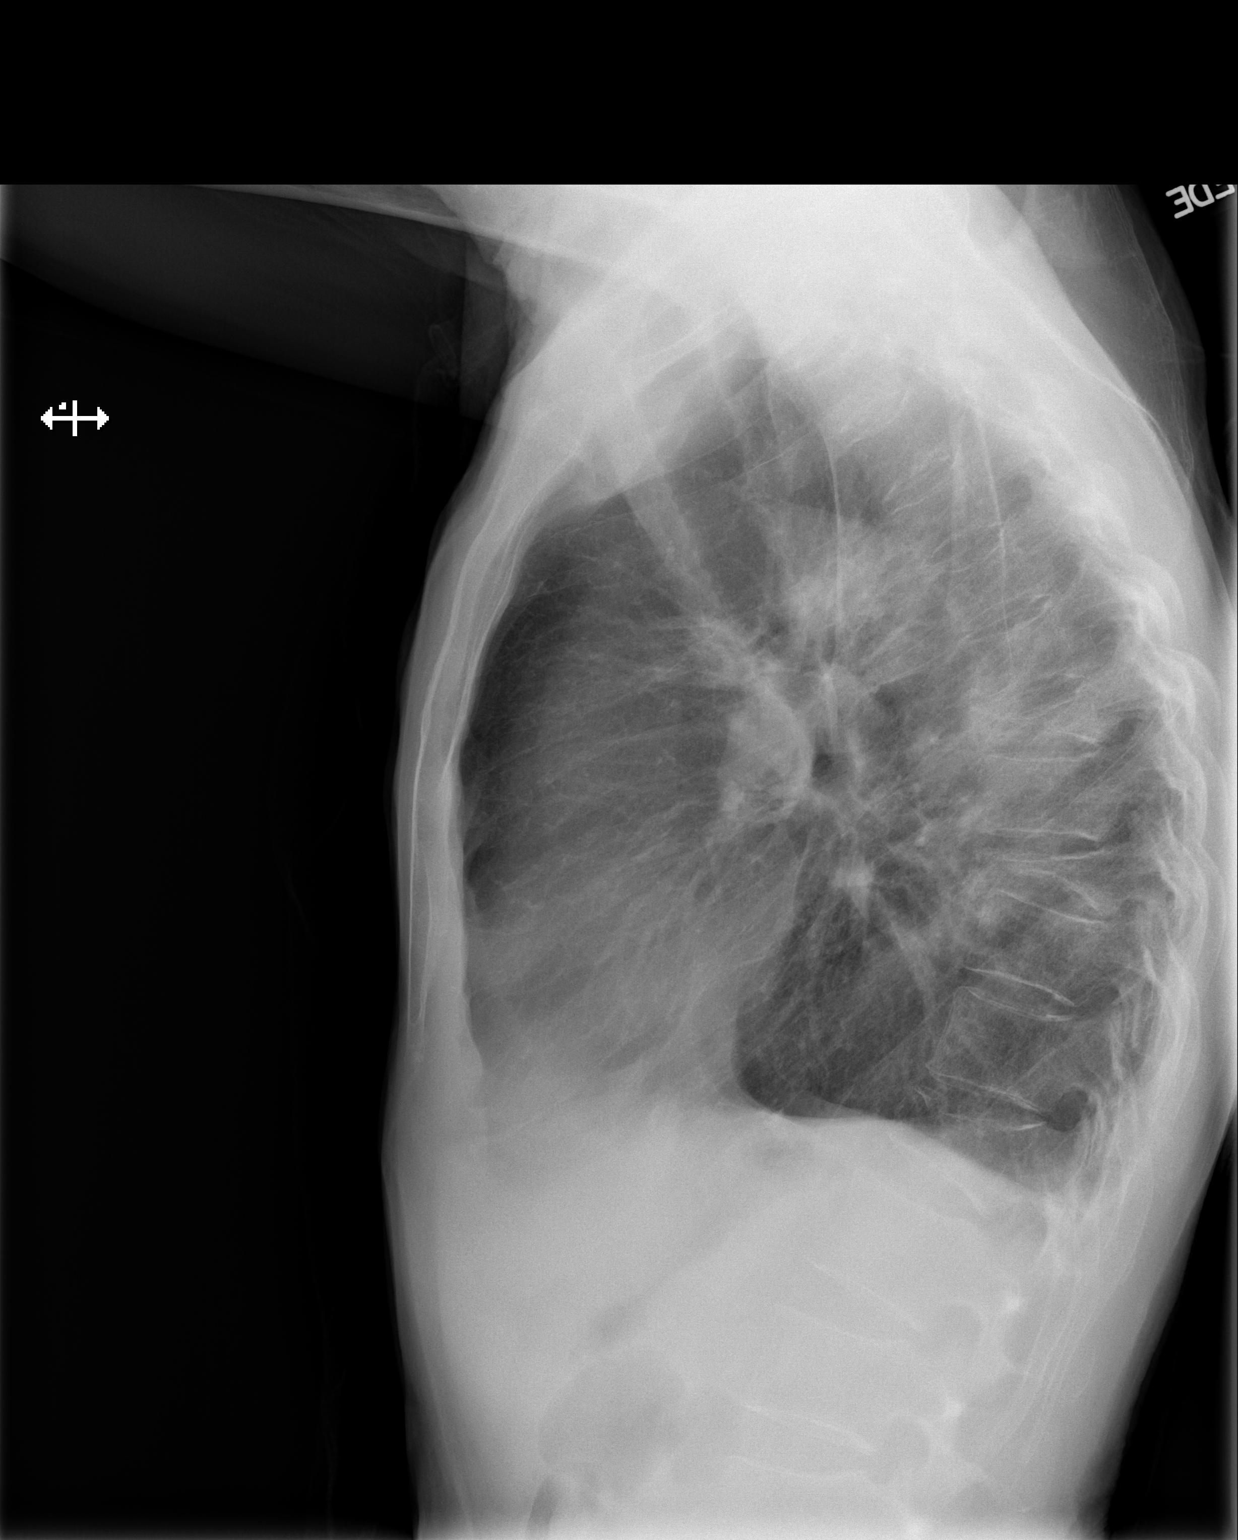

[2 of 2 positions shown; findings below may reference images not displayed]

FINDINGS: Normal heart size and pulmonary vascularity.
Tortuous aorta with atherosclerotic calcification.
Emphysematous and chronic bronchitic changes.
Right perihilar infiltrate versus atelectasis.
More focal area of opacity in the right upper lobe is seen, cannot
exclude pulmonary mass, 2.6 x 2.6 cm.
Question right nipple shadow.
Bones diffusely demineralized.
IMPRESSION: Emphysematous and chronic bronchitic changes consistent with COPD.
Right perihilar atelectasis versus consolidation with a new 2.6 x
2.6 cm diameter area of low focal opacity in the right upper lobe;
while this could represent an area of rounded infiltrate, tumor is
not excluded and further evaluation by computed tomography is
recommended, with contrast if the patient's renal function permits.
Potential right nipple shadow, can assess at time of CT.

## 2011-09-16 IMAGING — CT CT CHEST W/ CM
2 of 4 series · 15 of 36 positions shown, 18 images · IV contrast (APPLIED)
Comparison: Chest x-ray dated [DATE] and chest CT dated
[DATE]

CLINICAL DATA: Shortness of breath.  Cough.  Ill-defined density in
the right upper lobe on chest x-ray.

CT CHEST WITH CONTRAST
TECHNIQUE: Multidetector CT imaging of the chest was performed
following the standard protocol during bolus administration of
intravenous contrast.
Contrast: 80mL OMNIPAQUE IOHEXOL 300 MG/ML IV SOLN

[Series 2: routine chest 5.0 st · axial · 0.70mm/px · z∈[+913,+1208]mm · 12 of 69 slices shown, 15 images]
[im 5/69  mediastinal]
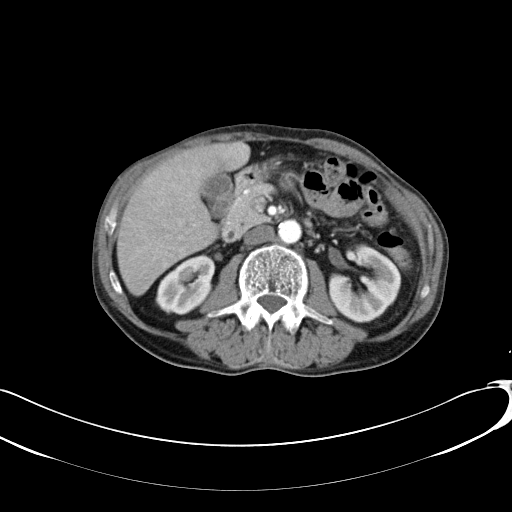
[im 5/69  lung]
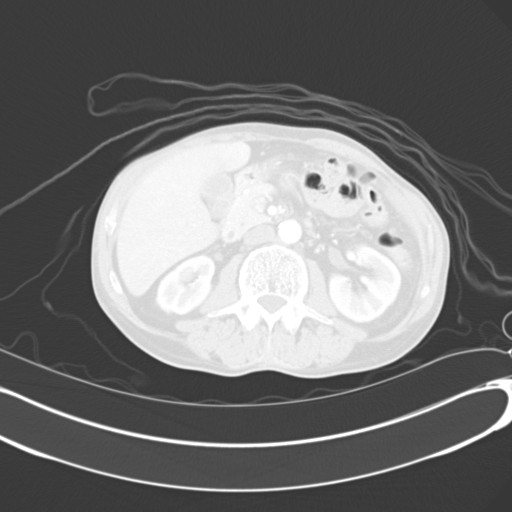
[im 10/69  lung]
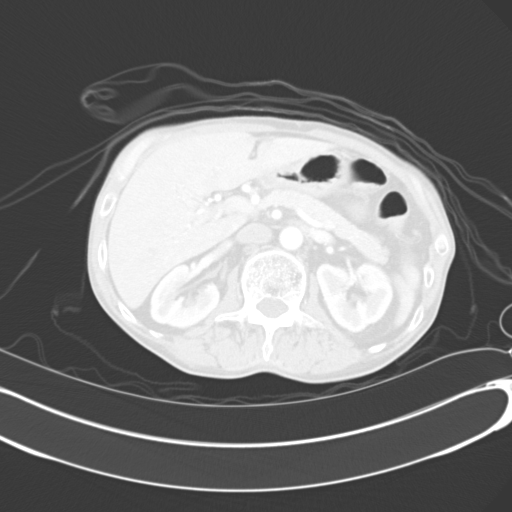
[im 15/69  lung]
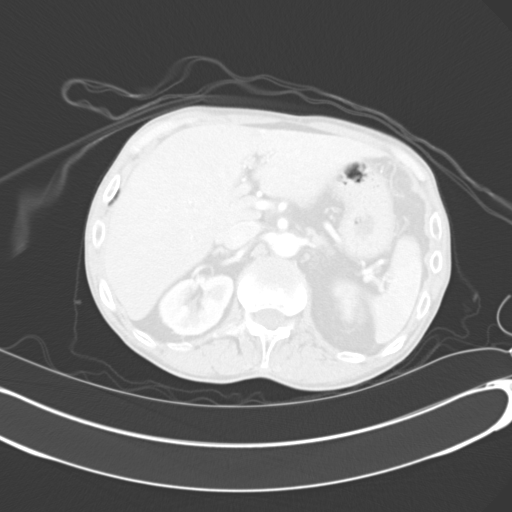
[im 20/69  lung]
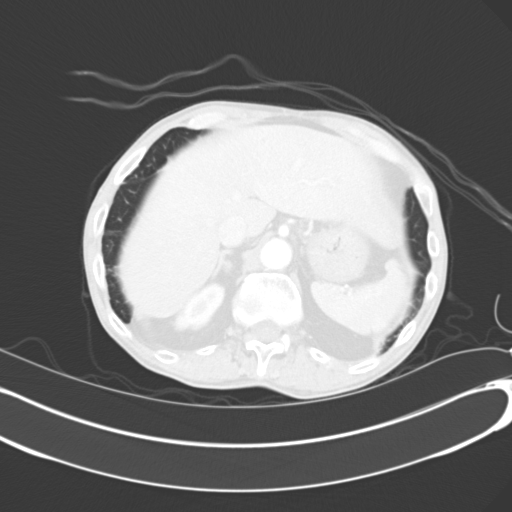
[im 25/69  mediastinal]
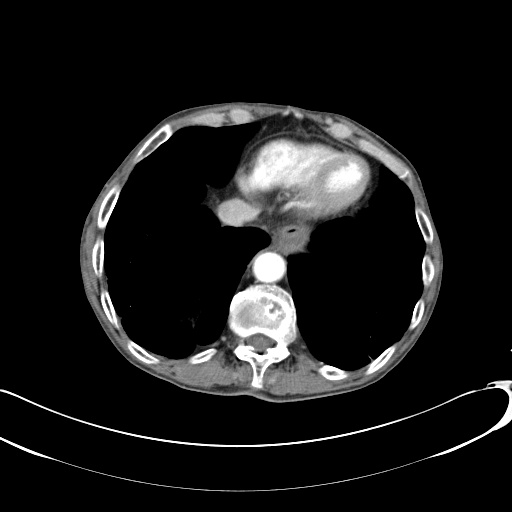
[im 25/69  lung]
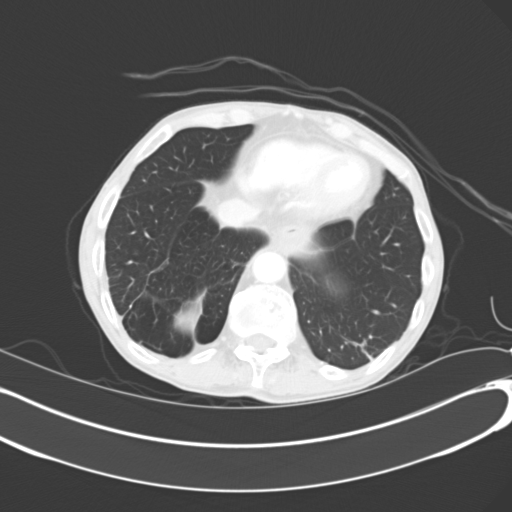
[im 30/69  lung]
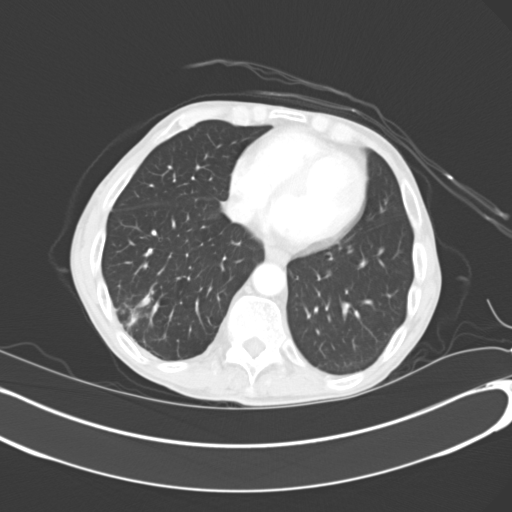
[im 39/69  lung]
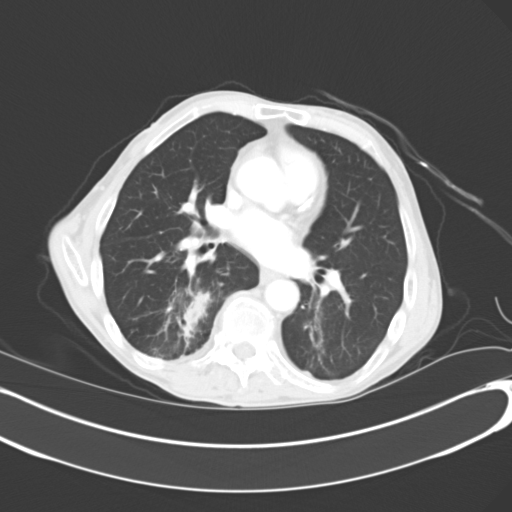
[im 44/69  lung]
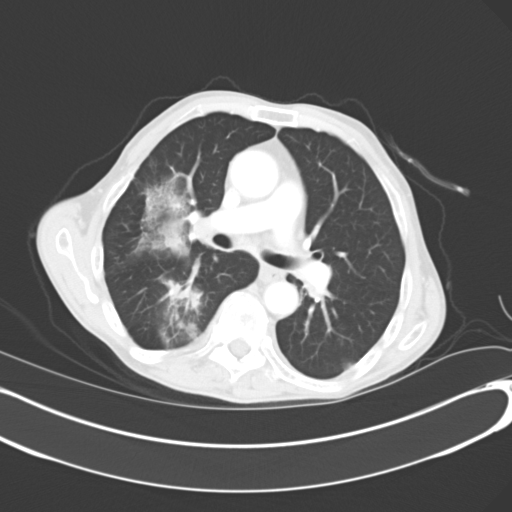
[im 49/69  mediastinal]
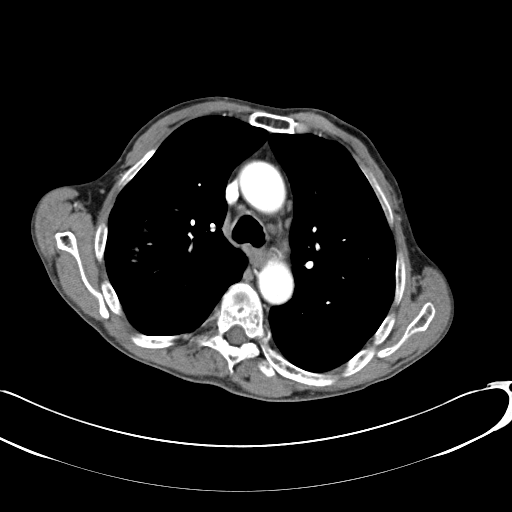
[im 49/69  lung]
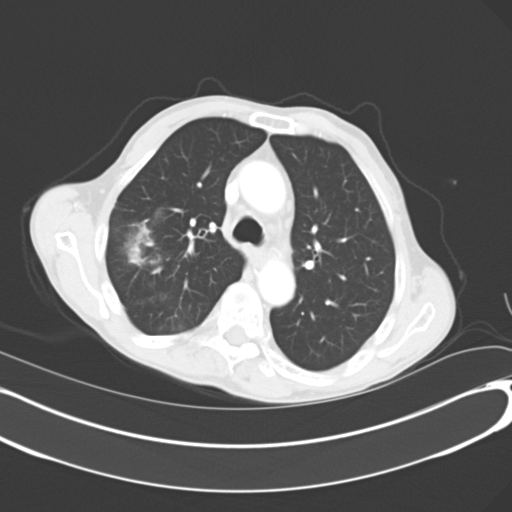
[im 54/69  lung]
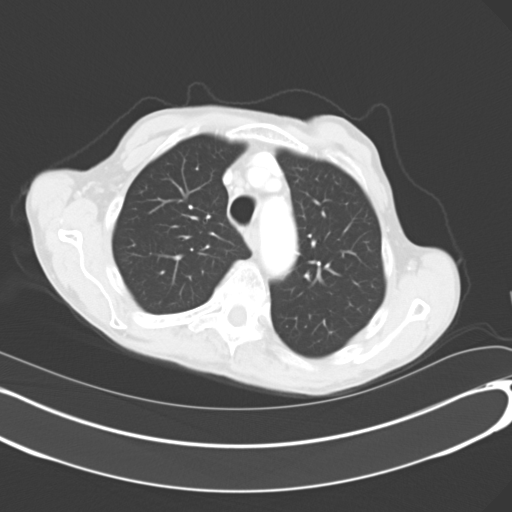
[im 59/69  lung]
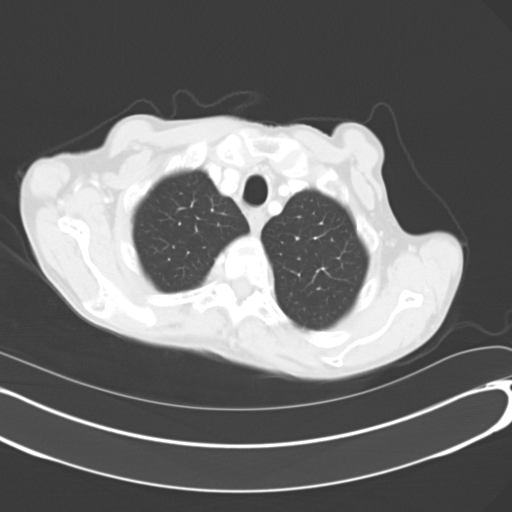
[im 64/69  lung]
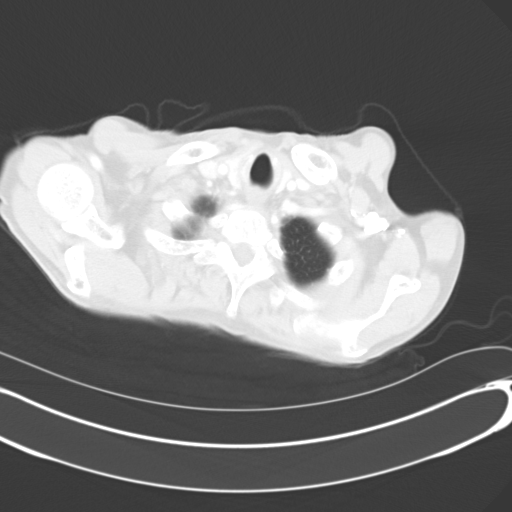

[Series 5: coronals · coronal · 0.67mm/px · 3 of 101 slices shown]
[im 21/101  lung]
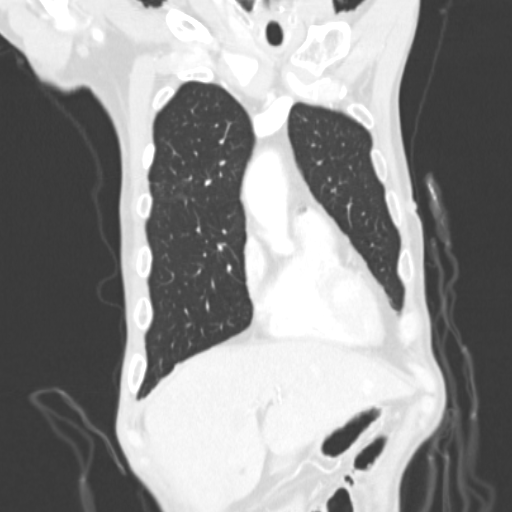
[im 41/101  lung]
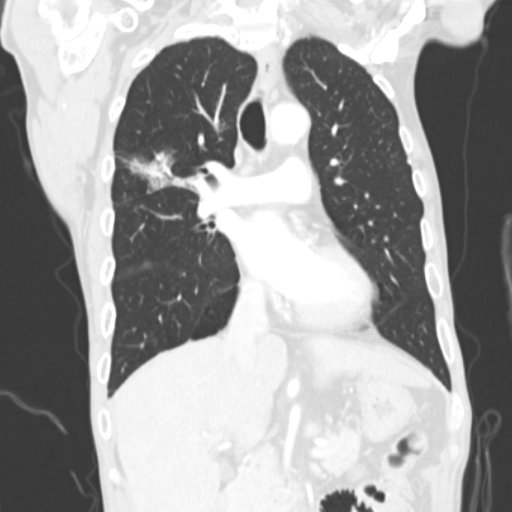
[im 61/101  lung]
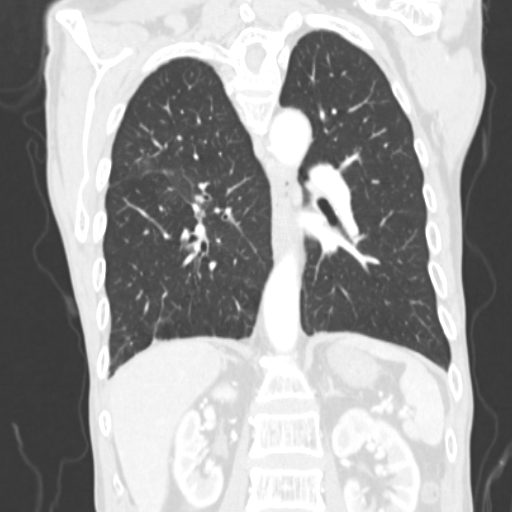

[15 of 36 positions shown; findings below may reference images not displayed]

FINDINGS: The patient has patchy infiltrates in the right upper
lobe as well as in the superior segment right lower lobe and more
peripherally and laterally in the right lower lobe.  In addition,
there is a small patchy area of infiltrate posteriorly in the left
lower lobe.  No significant hilar or mediastinal adenopathy.  Heart
size is normal.  No effusions.  Small posterior right diaphragmatic
hernia containing only fat.

Multiple compression fractures in the spine. Fractures of T7 and
T12 are old.  The fracture of T9 is new since [DATE] but does
not appear acute.

Scans of the upper abdomen demonstrate multiple hemangiomas in the
liver, unchanged.
IMPRESSION: 1.  Bilateral pneumonia, most prominent in the right upper and
lower lobes.  No mass worrisome for malignancy.
2.  Multiple old compression fractures.

## 2011-09-16 MED ORDER — DEXTROSE 5 % IV SOLN
1.0000 g | Freq: Once | INTRAVENOUS | Status: AC
  Administered 2011-09-16: 1 g via INTRAVENOUS
  Filled 2011-09-16: qty 10

## 2011-09-16 MED ORDER — IOHEXOL 300 MG/ML  SOLN
80.0000 mL | Freq: Once | INTRAMUSCULAR | Status: AC | PRN
  Administered 2011-09-16: 80 mL via INTRAVENOUS

## 2011-09-16 MED ORDER — SODIUM CHLORIDE 0.9 % IV SOLN
Freq: Once | INTRAVENOUS | Status: AC
  Administered 2011-09-16: 16:00:00 via INTRAVENOUS

## 2011-09-16 MED ORDER — ACETAMINOPHEN 325 MG PO TABS
650.0000 mg | ORAL_TABLET | Freq: Once | ORAL | Status: AC
  Administered 2011-09-16: 650 mg via ORAL
  Filled 2011-09-16: qty 2

## 2011-09-16 MED ORDER — AZITHROMYCIN 250 MG PO TABS: 250.0000 mg | ORAL_TABLET | Freq: Every day | ORAL | Status: AC

## 2011-09-16 MED ORDER — DEXTROSE 5 % IV SOLN
500.0000 mg | Freq: Once | INTRAVENOUS | Status: AC
  Administered 2011-09-16: 500 mg via INTRAVENOUS
  Filled 2011-09-16: qty 500

## 2011-09-16 NOTE — ED Notes (Signed)
To ed for eval of cough and body aches since Friday. Masked at triage

## 2011-09-16 NOTE — ED Notes (Signed)
Patient transported to X-ray 

## 2011-09-16 NOTE — ED Provider Notes (Signed)
History     [name]: [id number]  Arrival date & time 09/16/11  1245   First MD Initiated Contact with Patient 09/16/11 1351      Chief Complaint  Patient presents with  . Generalized Body Aches    (Consider location/radiation/quality/duration/timing/severity/associated sxs/prior treatment) HPI Comments: Patient reports for the last 3-4 days he has had a cough as well as fever and chills and pain in his left flank area. He reports when he eats he gets a little nauseous but denies vomiting or diarrhea. He denies sinus congestion or sore throat. He reports that he did not get his influenza vaccination this year. He also reports that he does not have a primary care physician. I asked him if he had insurance and he replied no. However when I talked registration and reviewed his records, the patient clearly has Medicare. I've told him that he is certainly able to obtain a primary care physician for routine care which would be to his benefit. He reports that he will look into it. He denies a skin rash, stiff neck or severe headache. He does note some rhinorrhea and nasal congestion.   The history is provided by the patient.    Past Medical History  Diagnosis Date  . Hypertension     History reviewed. No pertinent past surgical history.  History reviewed. No pertinent family history.  History  Substance Use Topics  . Smoking status: Never Smoker   . Smokeless tobacco: Not on file  . Alcohol Use: Yes     occ      Review of Systems  Constitutional: Positive for fever and chills.  HENT: Positive for congestion. Negative for neck pain and neck stiffness.   Respiratory: Positive for cough.   Gastrointestinal: Positive for nausea. Negative for vomiting, abdominal pain, diarrhea and constipation.  Genitourinary: Positive for dysuria and flank pain. Negative for scrotal swelling and testicular pain.  Musculoskeletal: Positive for myalgias.  Skin: Negative for rash.  Neurological: Negative  for dizziness, light-headedness and headaches.    Allergies  Review of patient's allergies indicates no known allergies.  Home Medications  No current outpatient prescriptions on file.  BP 141/107  Pulse 108  Temp(Src) 99.1 F (37.3 C) (Oral)  Resp 16  SpO2 99%  Physical Exam  Nursing note and vitals reviewed. Constitutional: He appears well-developed. He appears cachectic. No distress.       elevated temp and slight tachycardia  HENT:  Head: Normocephalic and atraumatic.  Eyes: Pupils are equal, round, and reactive to light. No scleral icterus.  Neck: Normal range of motion. Neck supple.  Cardiovascular: Regular rhythm.  Tachycardia present.   No murmur heard. Pulmonary/Chest: Effort normal. He has no wheezes. He has no rales.  Abdominal: Soft. Bowel sounds are normal. He exhibits no distension. There is no tenderness. There is CVA tenderness. There is no rebound and no guarding.  Skin: Skin is warm. No rash noted. He is not diaphoretic.    ED Course  Procedures (including critical care time)   Labs Reviewed  URINALYSIS, ROUTINE W REFLEX MICROSCOPIC  URINE CULTURE   No results found.   No diagnosis found.    MDM  In review of his prior records, the patient has history of urinary tract infection. Given his left flank pain, I'm concerned for possible pyelonephritis. The patient has not however toxic-appearing, blood pressure is adequate. His slight tachycardia is likely related to her fever. He also has a cough and will get a chest x-ray as well.  Again I have reiterated to him that it is important that he obtain a primary care physician for continued routine care. His room air saturation is 99% which is normal.        Ralph Jackson. Oletta Lamas, MD 09/16/11 1610

## 2011-09-16 NOTE — ED Notes (Signed)
Pt being moved to CDU  Report given to [facility]

## 2011-09-16 NOTE — ED Notes (Signed)
Patient with reported onset of  Cold sx on Friday.  He has left sided rib pain and back pain.  Patient with noted cough and decreased breath sounds bil

## 2011-09-16 NOTE — ED Notes (Signed)
Pt returned from xray

## 2011-09-16 NOTE — ED Provider Notes (Signed)
The patient is comfortable, NAD. VSS. IV Rocephin and zithromax given. 9:00 p.m. Attempting to have antibiotics filled through pharmacy. Patient has remained comfortable. No complaints. Sleeping. VSS. 9:30 - patient being discharged home with full prescription of medications.  Rodena Medin, PA-C 09/16/11 2134

## 2011-09-19 NOTE — ED Provider Notes (Signed)
Medical screening examination/treatment/procedure(s) were conducted as a shared visit with non-physician practitioner(s) and myself.  I personally evaluated the patient during the encounter Please see my original provider note.  Jeffrie Stander Y.   Gavin Pound. Oletta Lamas, MD 09/19/11 2020

## 2012-12-22 IMAGING — CR DG TIBIA/FIBULA 2V*R*
2 series · 2 of 2 positions shown · non-contrast
Comparison: [DATE].

CLINICAL DATA: Right leg pain.

EXAM:
RIGHT TIBIA AND FIBULA - 2 VIEW

[x tib-fib ap right]
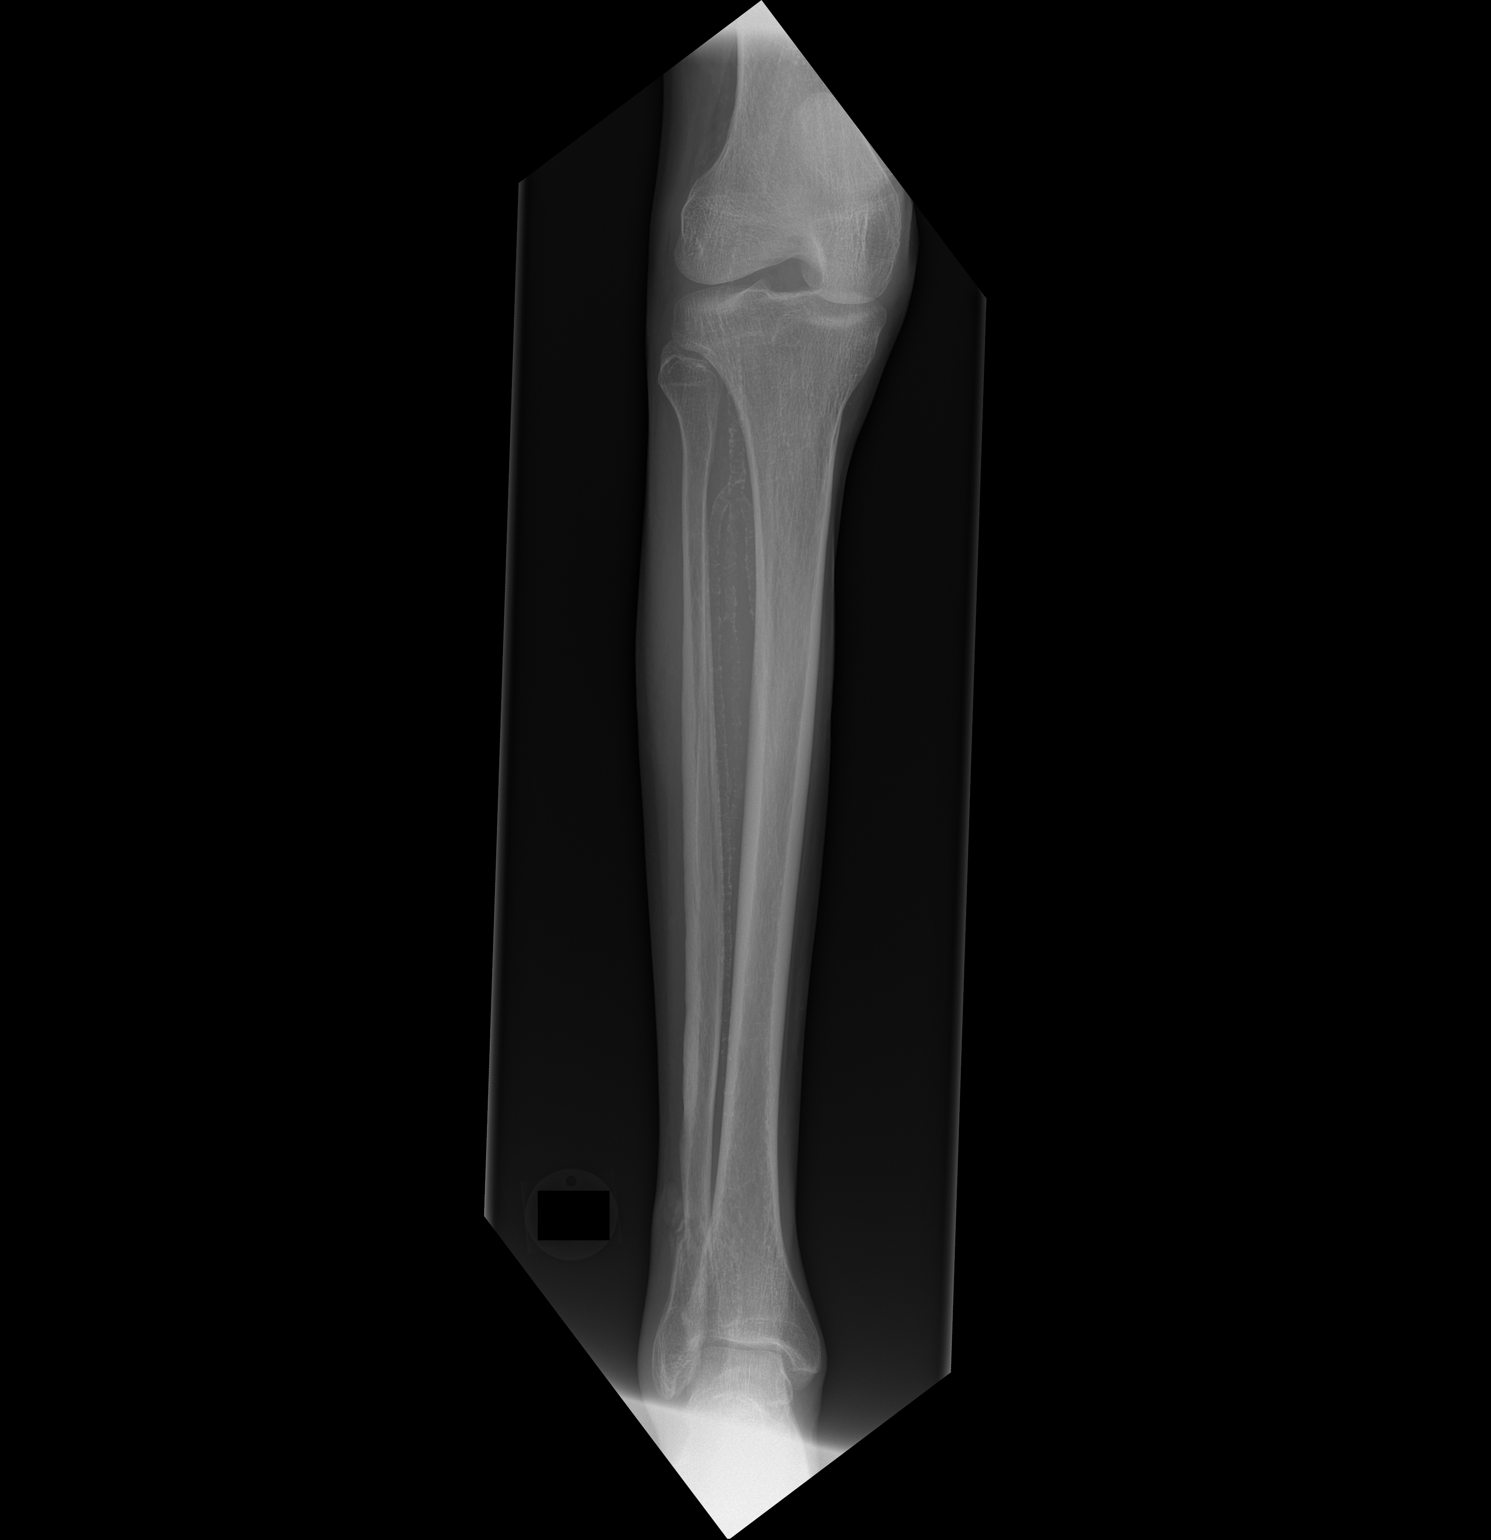

[x tib-fib lat right]
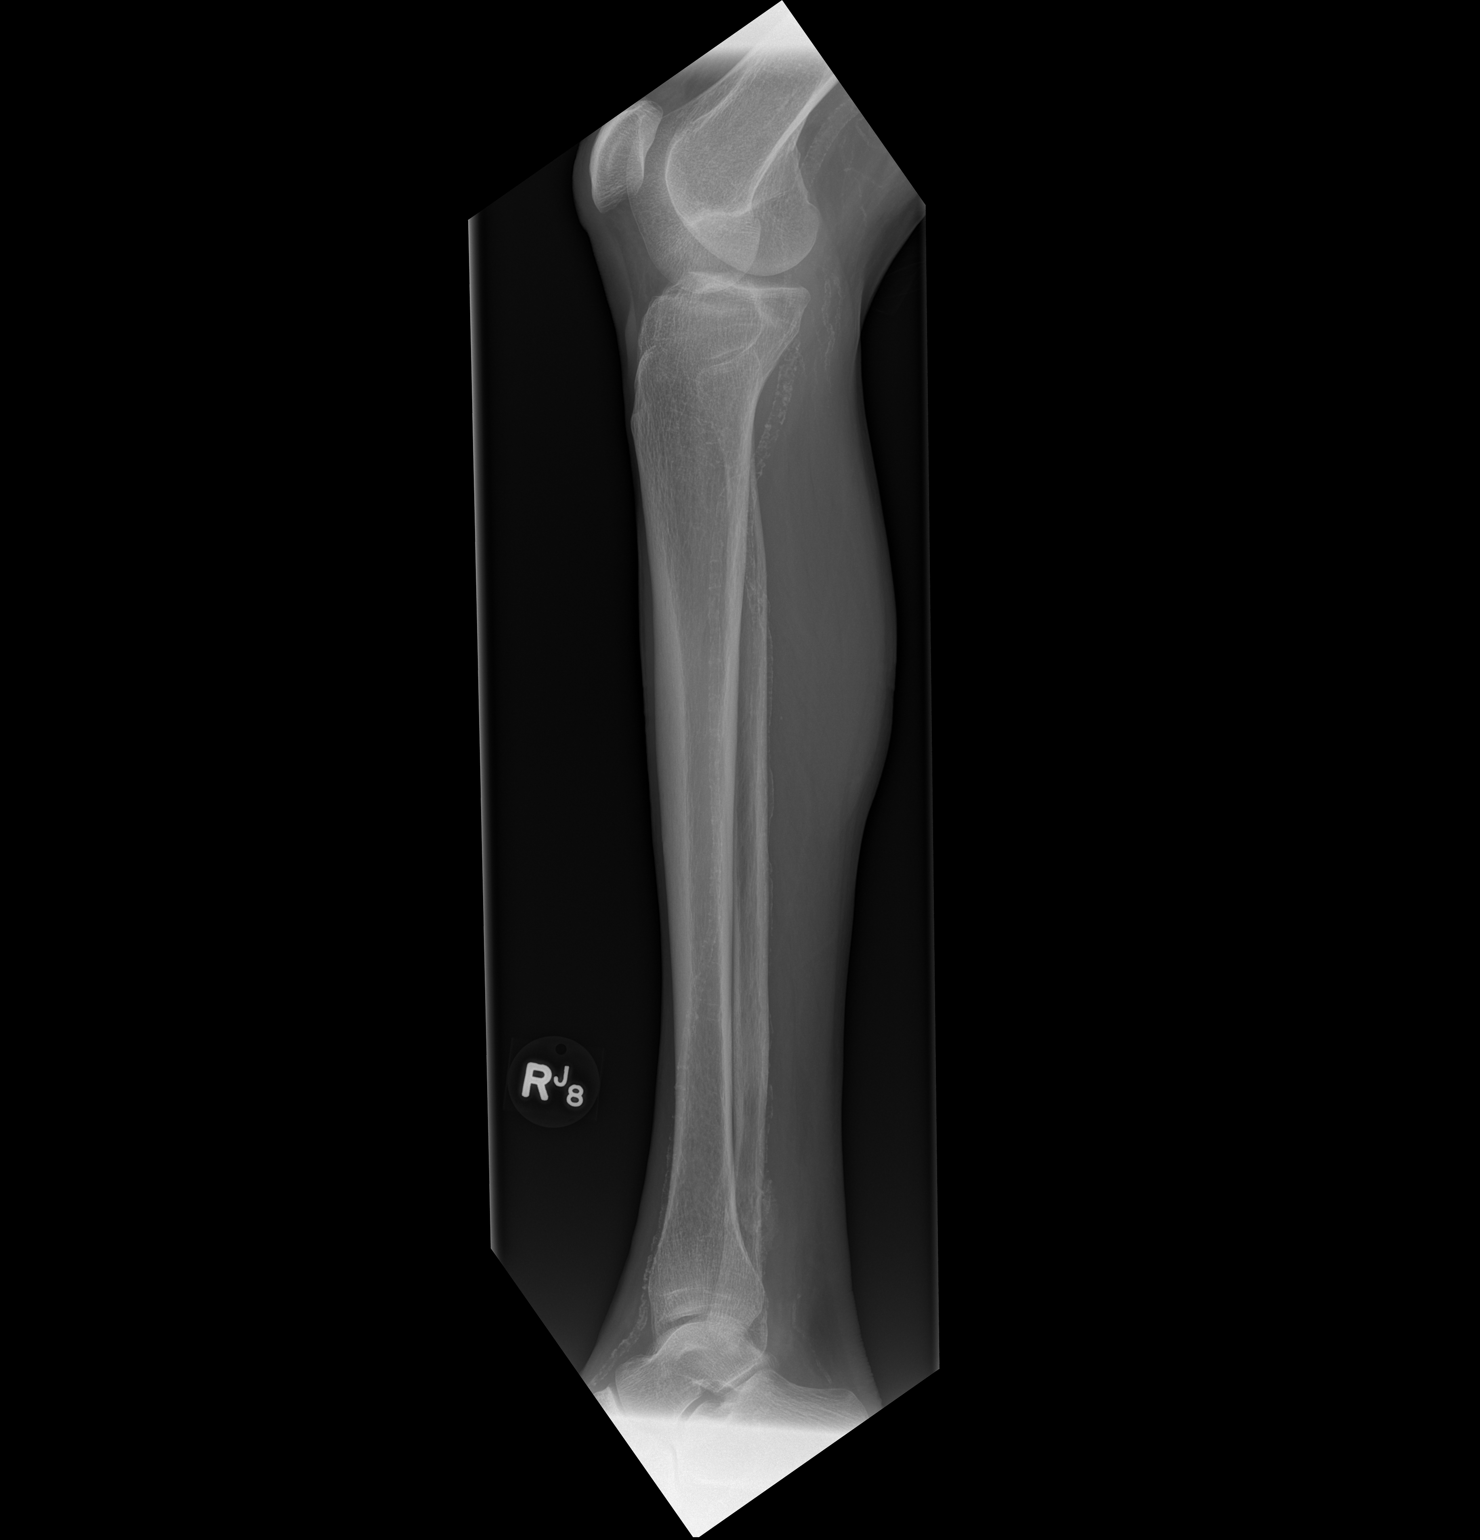

[2 of 2 positions shown; findings below may reference images not displayed]

FINDINGS: The knee and ankle joints are maintained. There is a healing distal
fibular shaft fracture with early callus formation. Extensive
vascular calcifications are noted.
IMPRESSION: Healing distal fibular shaft fracture.  No new/acute fracture.

## 2014-01-07 ENCOUNTER — Emergency Department: Payer: Self-pay | Admitting: Emergency Medicine

## 2014-01-07 LAB — CBC
HCT: 40.8 % (ref 40.0–52.0)
HGB: 13.5 g/dL (ref 13.0–18.0)
MCH: 31.3 pg (ref 26.0–34.0)
MCHC: 33.2 g/dL (ref 32.0–36.0)
MCV: 94 fL (ref 80–100)
Platelet: 303 10*3/uL (ref 150–440)
RBC: 4.32 10*6/uL — ABNORMAL LOW (ref 4.40–5.90)
RDW: 13 % (ref 11.5–14.5)
WBC: 8.3 10*3/uL (ref 3.8–10.6)

## 2014-01-07 LAB — COMPREHENSIVE METABOLIC PANEL
ALBUMIN: 3.8 g/dL (ref 3.4–5.0)
ANION GAP: 4 — AB (ref 7–16)
AST: 69 U/L — AB (ref 15–37)
Alkaline Phosphatase: 74 U/L
BILIRUBIN TOTAL: 0.4 mg/dL (ref 0.2–1.0)
BUN: 25 mg/dL — ABNORMAL HIGH (ref 7–18)
CHLORIDE: 101 mmol/L (ref 98–107)
CO2: 29 mmol/L (ref 21–32)
Calcium, Total: 9.2 mg/dL (ref 8.5–10.1)
Creatinine: 0.88 mg/dL (ref 0.60–1.30)
EGFR (Non-African Amer.): >60
Glucose: 90 mg/dL (ref 65–99)
Osmolality: 272 (ref 275–301)
Potassium: 3.8 mmol/L (ref 3.5–5.1)
SGPT (ALT): 79 U/L — ABNORMAL HIGH (ref 12–78)
Sodium: 134 mmol/L — ABNORMAL LOW (ref 136–145)
Total Protein: 7.2 g/dL (ref 6.4–8.2)

## 2014-01-07 LAB — ETHANOL: Ethanol: <3 mg/dL

## 2014-01-07 LAB — URINALYSIS, COMPLETE
Bilirubin,UR: NEGATIVE
Blood: NEGATIVE
Glucose,UR: NEGATIVE mg/dL (ref 0–75)
Leukocyte Esterase: NEGATIVE
NITRITE: NEGATIVE
PH: 5 (ref 4.5–8.0)
Protein: NEGATIVE
RBC,UR: 1 /HPF (ref 0–5)
Specific Gravity: 1.027 (ref 1.003–1.030)

## 2014-01-07 LAB — ACETAMINOPHEN LEVEL: Acetaminophen: <2 ug/mL

## 2014-01-07 LAB — DRUG SCREEN, URINE
Amphetamines, Ur Screen: NEGATIVE (ref ?–1000)
BARBITURATES, UR SCREEN: NEGATIVE (ref ?–200)
Benzodiazepine, Ur Scrn: NEGATIVE (ref ?–200)
COCAINE METABOLITE, UR ~~LOC~~: NEGATIVE (ref ?–300)
Cannabinoid 50 Ng, Ur ~~LOC~~: NEGATIVE (ref ?–50)
MDMA (ECSTASY) UR SCREEN: NEGATIVE (ref ?–500)
Methadone, Ur Screen: NEGATIVE (ref ?–300)
Opiate, Ur Screen: NEGATIVE (ref ?–300)
Phencyclidine (PCP) Ur S: NEGATIVE (ref ?–25)
TRICYCLIC, UR SCREEN: NEGATIVE (ref ?–1000)

## 2014-01-07 LAB — SALICYLATE LEVEL

## 2014-01-24 ENCOUNTER — Emergency Department (HOSPITAL_COMMUNITY)
Admission: EM | Admit: 2014-01-24 | Discharge: 2014-01-24 | Disposition: A | Discharge status: Home / Self Care | Payer: Medicare Other | Attending: Emergency Medicine | Admitting: Emergency Medicine

## 2014-01-24 ENCOUNTER — Encounter (HOSPITAL_COMMUNITY): Payer: Self-pay | Admitting: Emergency Medicine

## 2014-01-24 DIAGNOSIS — Z23 Encounter for immunization: Secondary | ICD-10-CM | POA: Insufficient documentation

## 2014-01-24 DIAGNOSIS — I10 Essential (primary) hypertension: Secondary | ICD-10-CM | POA: Insufficient documentation

## 2014-01-24 DIAGNOSIS — S81801A Unspecified open wound, right lower leg, initial encounter: Secondary | ICD-10-CM

## 2014-01-24 DIAGNOSIS — Z79899 Other long term (current) drug therapy: Secondary | ICD-10-CM | POA: Insufficient documentation

## 2014-01-24 DIAGNOSIS — Z87440 Personal history of urinary (tract) infections: Secondary | ICD-10-CM | POA: Insufficient documentation

## 2014-01-24 DIAGNOSIS — L97909 Non-pressure chronic ulcer of unspecified part of unspecified lower leg with unspecified severity: Secondary | ICD-10-CM | POA: Insufficient documentation

## 2014-01-24 LAB — CBG MONITORING, ED: GLUCOSE-CAPILLARY: 79 mg/dL (ref 70–99)

## 2014-01-24 MED ORDER — TETANUS-DIPHTH-ACELL PERTUSSIS 5-2.5-18.5 LF-MCG/0.5 IM SUSP
0.5000 mL | Freq: Once | INTRAMUSCULAR | Status: AC
  Administered 2014-01-24: 0.5 mL via INTRAMUSCULAR
  Filled 2014-01-24: qty 0.5

## 2014-01-24 NOTE — ED Notes (Signed)
Rinsed pt's wound with sterile saline and placed bacitracin and gauze over site per MD verbal order. Pt tolerated well. Gave pt supplies to change dressings at home. Pt verbalized his understanding of this.

## 2014-01-24 NOTE — ED Provider Notes (Addendum)
[name]: [id number]     Arrival date & time 01/24/14  1436 History   First MD Initiated Contact with Patient 01/24/14 1702     Chief Complaint  Patient presents with  . Skin Ulcer     (Consider location/radiation/quality/duration/timing/severity/associated sxs/prior Treatment) Patient is a 68 y.o. male presenting with wound check. The history is provided by the patient.  Wound Check This is a new problem. Episode onset: 2-3 weeks. The problem occurs constantly. The problem has been gradually improving. Associated symptoms comments: Pt states he hit his leg on something but cannot recall what and since that time the wound will not heal.. Nothing aggravates the symptoms. Nothing relieves the symptoms. Treatments tried: otc creams. The treatment provided mild relief.    Past Medical History  Diagnosis Date  . Hypertension   . Urinary tract infection    History reviewed. No pertinent past surgical history. History reviewed. No pertinent family history. History  Substance Use Topics  . Smoking status: Never Smoker   . Smokeless tobacco: Current User    Types: Chew  . Alcohol Use: Yes     Comment: heavy    Review of Systems  All other systems reviewed and are negative.     Allergies  Review of patient's allergies indicates no known allergies.  Home Medications   Prior to Admission medications   Medication Sig Start Date End Date Taking? Authorizing Provider  FLUoxetine (PROZAC) 40 MG capsule Take 40 mg by mouth daily.   Yes Historical Provider, MD   BP 129/92  Pulse 81  Temp(Src) 98.4 F (36.9 C) (Oral)  Resp 17  SpO2 100% Physical Exam  Nursing note and vitals reviewed. Constitutional: He is oriented to person, place, and time. He appears well-developed. He appears cachectic. No distress.  HENT:  Head: Normocephalic and atraumatic.  Cardiovascular: Normal rate.   Pulmonary/Chest: Effort normal.  Neurological: He is alert and oriented to person, place, and time.    Skin: Skin is warm and dry. No rash noted. No erythema.     Psychiatric: He has a normal mood and affect. His behavior is normal.    ED Course  Procedures (including critical care time) Labs Review Labs Reviewed  CBG MONITORING, ED    Imaging Review No results found.   EKG Interpretation None      MDM   Final diagnoses:  Wound of right lower extremity    Patient with a skin wound on his right lower fibula that has been present for 2-3 weeks.  Wound without appearance of infection.  Wound edges debrided and wound care applied.  Tetanus updated.  Given f/u with wound center.  No evidence of diabetes.    Gwyneth Sprout, MD 01/24/14 4037  Gwyneth Sprout, MD 01/24/14 1740

## 2014-01-24 NOTE — ED Notes (Signed)
Pt has 1.5 cm x 2cm wound to left outer ankle. Dr. Tanna Savoy at bedside removing outer slough on wound.

## 2014-01-24 NOTE — Discharge Instructions (Signed)
Apply ointment 2 times a day and wash with soap and water 2 times a day.

## 2014-01-24 NOTE — ED Notes (Signed)
MD Plunkett at bedside. 

## 2014-01-24 NOTE — ED Notes (Signed)
He states he scraped his R outer ankle 3 weeks ago and the wound will not heal. States "it just keeps getting bigger and deeper." cms intact

## 2014-01-24 NOTE — ED Notes (Signed)
cbg 79 

## 2014-01-31 ENCOUNTER — Emergency Department (HOSPITAL_COMMUNITY): Payer: Medicare Other

## 2014-01-31 ENCOUNTER — Emergency Department (HOSPITAL_COMMUNITY)
Admission: EM | Admit: 2014-01-31 | Discharge: 2014-01-31 | Disposition: A | Discharge status: Home / Self Care | Payer: Medicare Other | Attending: Emergency Medicine | Admitting: Emergency Medicine

## 2014-01-31 ENCOUNTER — Encounter (HOSPITAL_COMMUNITY): Payer: Self-pay | Admitting: Emergency Medicine

## 2014-01-31 DIAGNOSIS — L03119 Cellulitis of unspecified part of limb: Secondary | ICD-10-CM

## 2014-01-31 DIAGNOSIS — Z79899 Other long term (current) drug therapy: Secondary | ICD-10-CM | POA: Insufficient documentation

## 2014-01-31 DIAGNOSIS — L0390 Cellulitis, unspecified: Secondary | ICD-10-CM

## 2014-01-31 DIAGNOSIS — Y939 Activity, unspecified: Secondary | ICD-10-CM | POA: Insufficient documentation

## 2014-01-31 DIAGNOSIS — S82899A Other fracture of unspecified lower leg, initial encounter for closed fracture: Secondary | ICD-10-CM | POA: Insufficient documentation

## 2014-01-31 DIAGNOSIS — Z87440 Personal history of urinary (tract) infections: Secondary | ICD-10-CM | POA: Insufficient documentation

## 2014-01-31 DIAGNOSIS — L97309 Non-pressure chronic ulcer of unspecified ankle with unspecified severity: Secondary | ICD-10-CM | POA: Insufficient documentation

## 2014-01-31 DIAGNOSIS — Y929 Unspecified place or not applicable: Secondary | ICD-10-CM | POA: Insufficient documentation

## 2014-01-31 DIAGNOSIS — I10 Essential (primary) hypertension: Secondary | ICD-10-CM | POA: Insufficient documentation

## 2014-01-31 DIAGNOSIS — L02419 Cutaneous abscess of limb, unspecified: Secondary | ICD-10-CM | POA: Insufficient documentation

## 2014-01-31 DIAGNOSIS — X58XXXA Exposure to other specified factors, initial encounter: Secondary | ICD-10-CM | POA: Insufficient documentation

## 2014-01-31 DIAGNOSIS — S82409A Unspecified fracture of shaft of unspecified fibula, initial encounter for closed fracture: Secondary | ICD-10-CM

## 2014-01-31 LAB — CBC WITH DIFFERENTIAL/PLATELET
BASOS ABS: 0 10*3/uL (ref 0.0–0.1)
BASOS PCT: 0 % (ref 0–1)
EOS PCT: 0 % (ref 0–5)
Eosinophils Absolute: 0 10*3/uL (ref 0.0–0.7)
HCT: 38.5 % — ABNORMAL LOW (ref 39.0–52.0)
HEMOGLOBIN: 13.2 g/dL (ref 13.0–17.0)
LYMPHS ABS: 1.4 10*3/uL (ref 0.7–4.0)
Lymphocytes Relative: 16 % (ref 12–46)
MCH: 31.5 pg (ref 26.0–34.0)
MCHC: 34.3 g/dL (ref 30.0–36.0)
MCV: 91.9 fL (ref 78.0–100.0)
MONO ABS: 1.1 10*3/uL — AB (ref 0.1–1.0)
MONOS PCT: 12 % (ref 3–12)
NEUTROS ABS: 6.6 10*3/uL (ref 1.7–7.7)
NEUTROS PCT: 72 % (ref 43–77)
PLATELETS: 317 10*3/uL (ref 150–400)
RBC: 4.19 MIL/uL — AB (ref 4.22–5.81)
RDW: 14.2 % (ref 11.5–15.5)
WBC: 9.1 10*3/uL (ref 4.0–10.5)

## 2014-01-31 LAB — CBG MONITORING, ED: GLUCOSE-CAPILLARY: 94 mg/dL (ref 70–99)

## 2014-01-31 IMAGING — CR DG TIBIA/FIBULA 2V*R*
4 series · 4 of 4 positions shown · non-contrast
Comparison: None.

CLINICAL DATA: Wound infection

EXAM:
RIGHT TIBIA AND FIBULA - 2 VIEW

[t tib/fib ap right (1 of 2)]
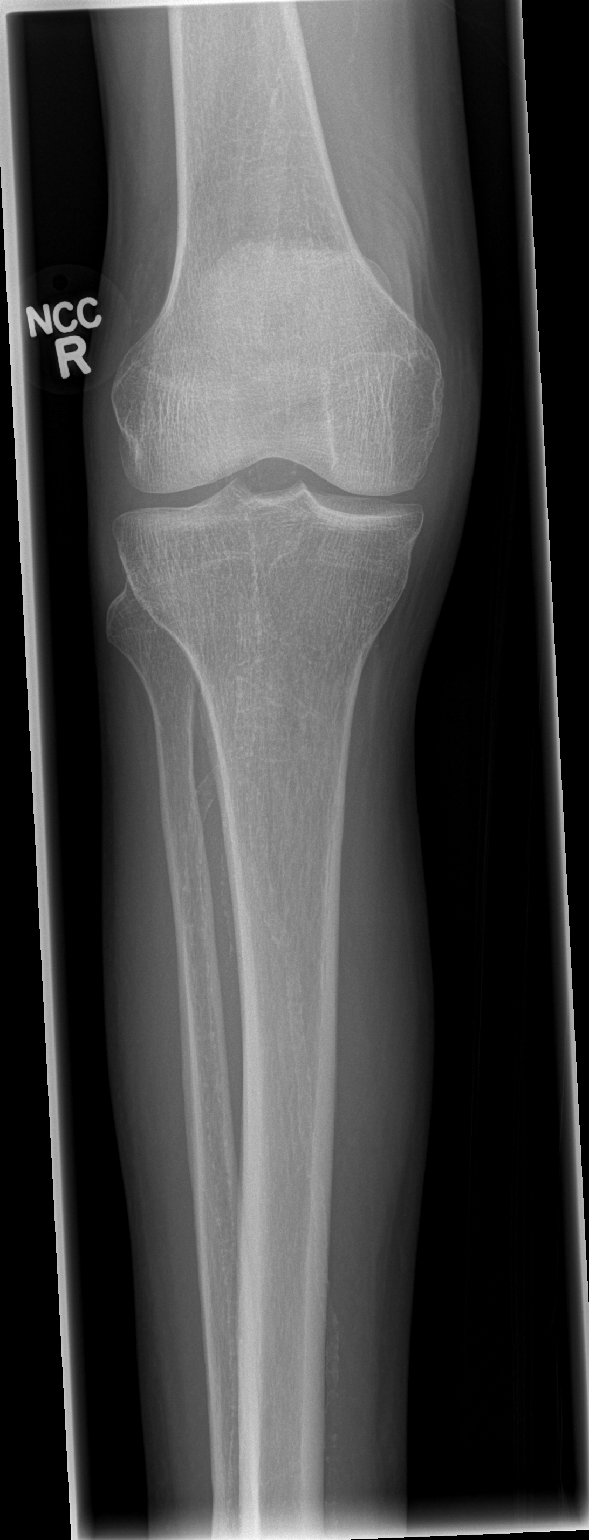

[t tib/fib ap right (2 of 2)]
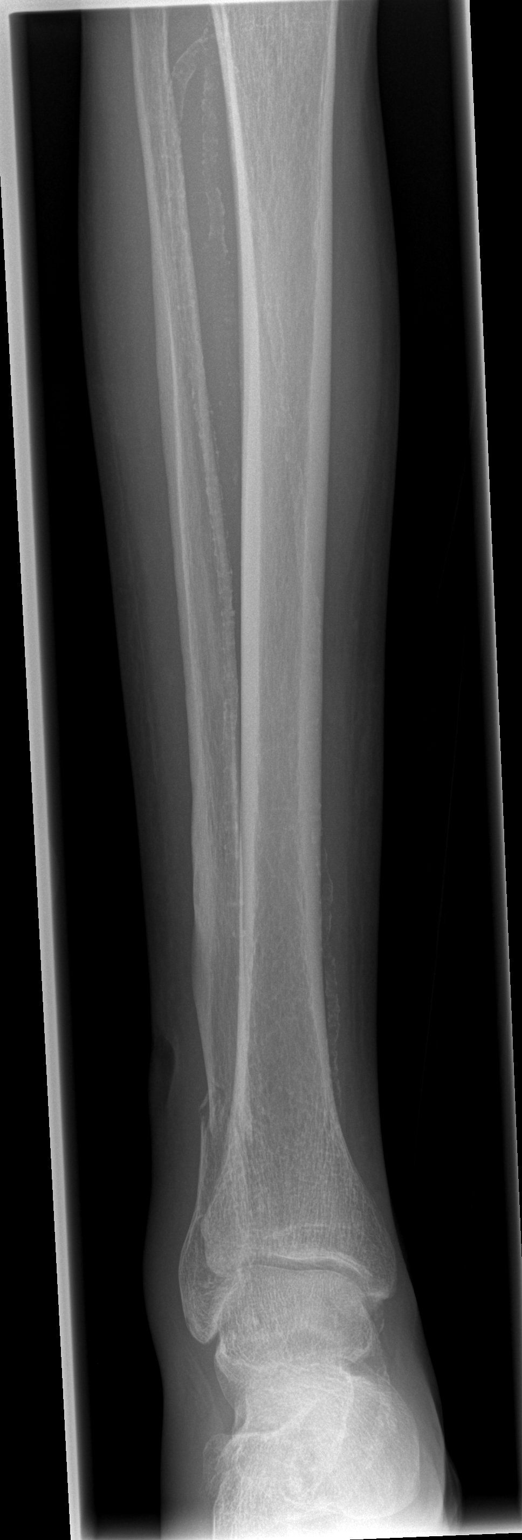

[t tib/fib lat right (1 of 2)]
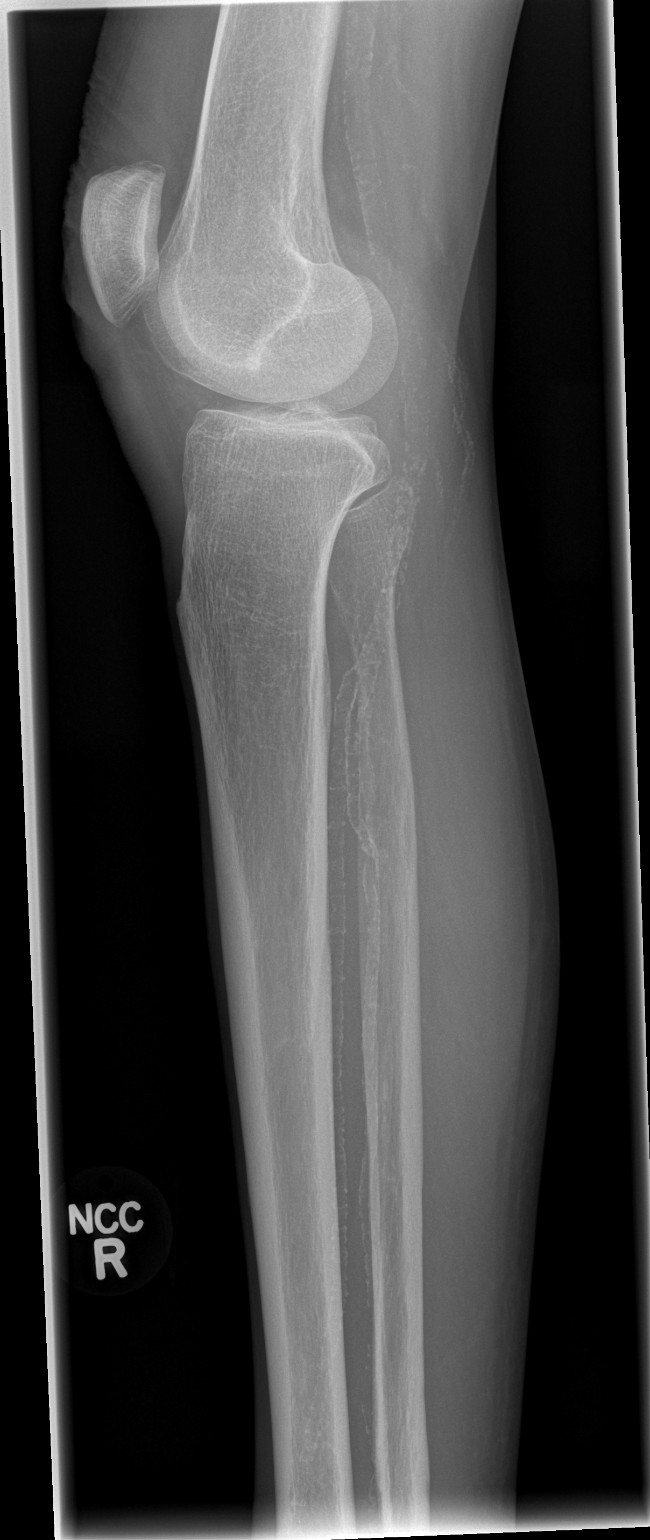

[t tib/fib lat right (2 of 2)]
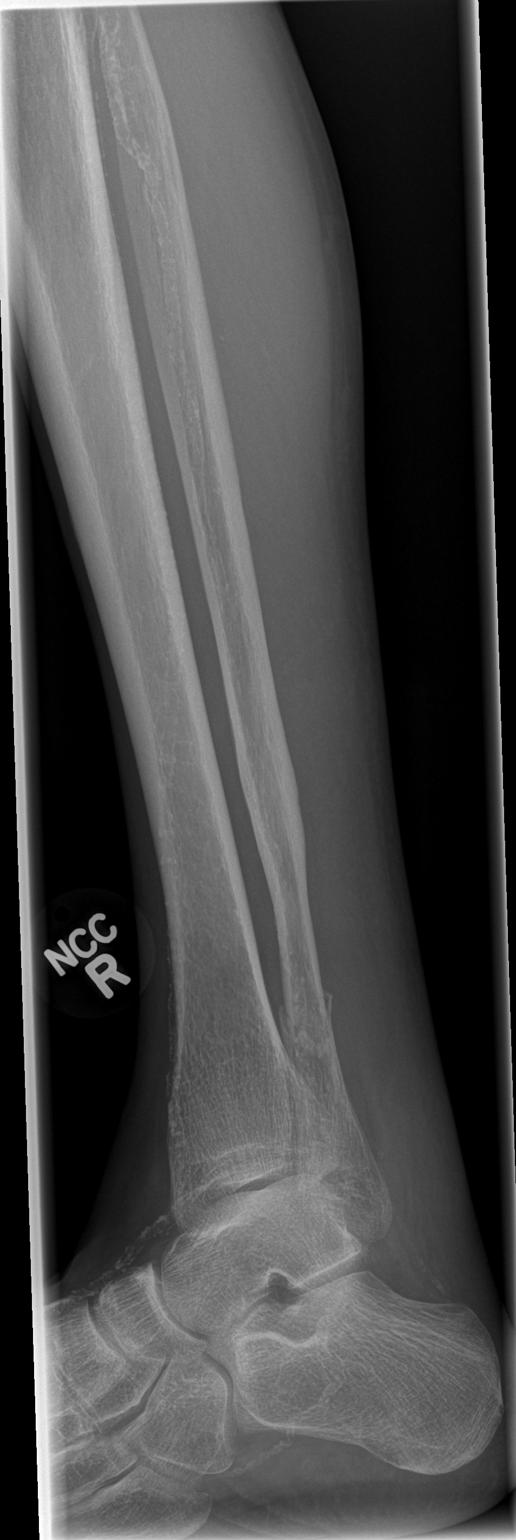

[4 of 4 positions shown; findings below may reference images not displayed]

FINDINGS: Marked atrophy of the soft tissues suggests immobility.
Atherosclerotic calcifications are present throughout the visualized
arterial tree. There is an acute mildly displaced fracture through
the distal fibular metaphysis with overlying ulceration of the soft
tissues. Normal bony mineralization. No lytic or blastic osseous
lesions.
IMPRESSION: Acute minimally displaced fracture of the distal fibular diaphysis
with ulceration of the overlying soft tissues concerning for open
fracture.

Extensive atherosclerotic vascular calcifications.

## 2014-01-31 MED ORDER — CLINDAMYCIN PHOSPHATE 600 MG/50ML IV SOLN
600.0000 mg | Freq: Once | INTRAVENOUS | Status: AC
  Administered 2014-01-31: 600 mg via INTRAVENOUS
  Filled 2014-01-31: qty 50

## 2014-01-31 MED ORDER — DOXYCYCLINE HYCLATE 100 MG PO CAPS: 100.0000 mg | ORAL_CAPSULE | Freq: Two times a day (BID) | ORAL | Status: DC

## 2014-01-31 MED ORDER — MUPIROCIN CALCIUM 2 % EX CREA: 1.0000 "application " | TOPICAL_CREAM | Freq: Two times a day (BID) | CUTANEOUS | Status: DC

## 2014-01-31 MED ORDER — CIPROFLOXACIN HCL 500 MG PO TABS: 500.0000 mg | ORAL_TABLET | Freq: Two times a day (BID) | ORAL | Status: DC

## 2014-01-31 NOTE — ED Notes (Signed)
Pt here for infection to RLE. Recently seen for the same.

## 2014-01-31 NOTE — Discharge Instructions (Signed)
Soak your foot once a day in soapy water, then dry it and apply ointment. Call Dr. Eliberto IvoryBlackman's office tomorrow to get appointment for Wednesday.  Cellulitis Cellulitis is an infection of the skin and the tissue beneath it. The infected area is usually red and tender. Cellulitis occurs most often in the arms and lower legs.  CAUSES  Cellulitis is caused by bacteria that enter the skin through cracks or cuts in the skin. The most common types of bacteria that cause cellulitis are Staphylococcus and Streptococcus. SYMPTOMS   Redness and warmth.  Swelling.  Tenderness or pain.  Fever. DIAGNOSIS  Your caregiver can usually determine what is wrong based on a physical exam. Blood tests may also be done. TREATMENT  Treatment usually involves taking an antibiotic medicine. HOME CARE INSTRUCTIONS   Take your antibiotics as directed. Finish them even if you start to feel better.  Keep the infected arm or leg elevated to reduce swelling.  Apply a warm cloth to the affected area up to 4 times per day to relieve pain.  Only take over-the-counter or prescription medicines for pain, discomfort, or fever as directed by your caregiver.  Keep all follow-up appointments as directed by your caregiver. SEEK MEDICAL CARE IF:   You notice red streaks coming from the infected area.  Your red area gets larger or turns dark in color.  Your bone or joint underneath the infected area becomes painful after the skin has healed.  Your infection returns in the same area or another area.  You notice a swollen bump in the infected area.  You develop new symptoms. SEEK IMMEDIATE MEDICAL CARE IF:   You have a fever.  You feel very sleepy.  You develop vomiting or diarrhea.  You have a general ill feeling (malaise) with muscle aches and pains. MAKE SURE YOU:   Understand these instructions.  Will watch your condition.  Will get help right away if you are not doing well or get worse. Document  Released: 05/29/2005 Document Revised: 02/18/2012 Document Reviewed: 11/04/2011 [facility] Patient Information 2014 [place], [place].  Fibular Fracture, Ankle, Adult, Treated With or Without Immobilization A fibular fracture at your ankle is a break (fracture) bone in the smallest of the two bones in your lower leg, located on the outside of your leg (fibula) close to the area at your ankle joint. CAUSES  Rolling your ankle.  Twisting your ankle.  Extreme flexing or extending of your foot.  Severe force on your ankle as when falling from a distance. RISK FACTORS  Jumping activities.  Participation in sports.  Osteoporosis.  Advanced age.  Previous ankle injuries. SIGNS AND SYMPTOMS  Pain.  Swelling.  Inability to put weight on injured ankle.  Bruising.  Bone deformities at site of injury. DIAGNOSIS  This fracture is diagnosed with the help of an X-ray exam. TREATMENT  If the fractured bone did not move out of place it usually will heal without problems and does casting or splinting. If immobilization is needed for comfort or the fractured bone moved out of place and will not heal properly with immobilization, a cast or splint will be used. HOME CARE INSTRUCTIONS   Apply ice to the area of injury:  Put ice in a plastic bag.  Place a towel between your skin and the bag.  Leave the ice on for 20 minutes, 2 3 times a day.  Use crutches as directed. Resume walking without crutches as directed by your health care provider.  Only take over-the-counter  or prescription medicines for pain, discomfort, or fever as directed by your health care provider.  If you have a removable splint or boot, do not remove the boot unless directed by your health care provider. SEEK MEDICAL CARE IF:   You have continued pain or more swelling  The medications do not control the pain. SEEK IMMEDIATE MEDICAL CARE IF:  You develop severe pain in the leg or foot.  Your skin or nails below  the injury turn blue or grey or feel cold or numb. MAKE SURE YOU:   Understand these instructions.  Will watch your condition.  Will get help right away if you are not doing well or get worse. Document Released: 08/19/2005 Document Revised: 06/09/2013 Document Reviewed: 03/31/2013 [facility] Patient Information 2014 [place], [place].

## 2014-01-31 NOTE — ED Notes (Signed)
Pt hypertensive but when I talked to him about it he said he does not have high blood pressure

## 2014-01-31 NOTE — ED Notes (Signed)
Pt called for triage with no answer 

## 2014-01-31 NOTE — ED Provider Notes (Signed)
[name]: [id number]     Arrival date & time 01/31/14  1326 History   First MD Initiated Contact with Patient 01/31/14 1553     Chief Complaint  Patient presents with  . Wound Infection     (Consider location/radiation/quality/duration/timing/severity/associated sxs/prior Treatment) HPI Comments: Patient presents to the ER for evaluation of a wound on the outside of his right lower leg. This reports that he was seen here in the ER for this previously. He was prescribed medication, but "couldn't get to the pharmacy". He has not taken the medication. Patient reports that there is persistent swelling and moderate pain in the outside portion of his ankle in the vomitus. He denies being a diabetic. No new injury. No fever.   Past Medical History  Diagnosis Date  . Hypertension   . Urinary tract infection    History reviewed. No pertinent past surgical history. History reviewed. No pertinent family history. History  Substance Use Topics  . Smoking status: Never Smoker   . Smokeless tobacco: Current User    Types: Chew  . Alcohol Use: Yes     Comment: heavy    Review of Systems  Constitutional: Negative for fever.  Skin: Positive for wound.  All other systems reviewed and are negative.     Allergies  Review of patient's allergies indicates no known allergies.  Home Medications   Prior to Admission medications   Medication Sig Start Date End Date Taking? Authorizing Provider  cholecalciferol (VITAMIN D) 1000 UNITS tablet Take 1,000 Units by mouth daily.   Yes Historical Provider, MD  FLUoxetine (PROZAC) 40 MG capsule Take 40 mg by mouth daily.   Yes Historical Provider, MD   BP 160/85  Pulse 71  Temp(Src) 98.2 F (36.8 C)  Resp 23  Wt 94 lb 1 oz (42.666 kg)  SpO2 99% Physical Exam  Constitutional: He is oriented to person, place, and time. He appears well-developed and well-nourished. No distress.  HENT:  Head: Normocephalic and atraumatic.  Right Ear: Hearing normal.    Left Ear: Hearing normal.  Nose: Nose normal.  Mouth/Throat: Oropharynx is clear and moist and mucous membranes are normal.  Eyes: Conjunctivae and EOM are normal. Pupils are equal, round, and reactive to light.  Neck: Normal range of motion. Neck supple.  Cardiovascular: Regular rhythm, S1 normal and S2 normal.  Exam reveals no gallop and no friction rub.   No murmur heard. Pulmonary/Chest: Effort normal and breath sounds normal. No respiratory distress. He exhibits no tenderness.  Abdominal: Soft. Normal appearance and bowel sounds are normal. There is no hepatosplenomegaly. There is no tenderness. There is no rebound, no guarding, no tenderness at McBurney's point and negative Murphy's sign. No hernia.  Musculoskeletal: Normal range of motion.  Neurological: He is alert and oriented to person, place, and time. He has normal strength. No cranial nerve deficit or sensory deficit. Coordination normal. GCS eye subscore is 4. GCS verbal subscore is 5. GCS motor subscore is 6.  Skin: Skin is warm, dry and intact. No rash noted. No cyanosis.     Psychiatric: He has a normal mood and affect. His speech is normal and behavior is normal. Thought content normal.      ED Course  Procedures (including critical care time) Labs Review Labs Reviewed  CBC WITH DIFFERENTIAL - Abnormal; Notable for the following:    RBC 4.19 (*)    HCT 38.5 (*)    Monocytes Absolute 1.1 (*)    All other components within normal limits  CBC WITH DIFFERENTIAL  COMPREHENSIVE METABOLIC PANEL  CBG MONITORING, ED    Imaging Review Dg Tibia/fibula Right  01/31/2014   CLINICAL DATA:  Wound infection  EXAM: RIGHT TIBIA AND FIBULA - 2 VIEW  COMPARISON:  None.  FINDINGS: Marked atrophy of the soft tissues suggests immobility. Atherosclerotic calcifications are present throughout the visualized arterial tree. There is an acute mildly displaced fracture through the distal fibular metaphysis with overlying ulceration of the  soft tissues. Normal bony mineralization. No lytic or blastic osseous lesions.  IMPRESSION: Acute minimally displaced fracture of the distal fibular diaphysis with ulceration of the overlying soft tissues concerning for open fracture.  Extensive atherosclerotic vascular calcifications.   Electronically Signed   By: Malachy MoanHeath  McCullough M.D.   On: 01/31/2014 18:14     EKG Interpretation None      MDM   Final diagnoses:  Fibula fracture  Ulcer of ankle  Cellulitis   Presents to the ER with complaints of pain and swelling of the right lower leg. He does have an ulceration over the lower leg which was seen at his previous visit. Patient admits that he did not purchase or take antibiotics were prescribed at his previous visit. Hisincreased swelling. X-ray was performed to evaluate for the possibility of underlying osteomyelitis. He does not have osteomyelitis, but has evidence of fracture of the distal fibula. He is unsure how this happened, has not had any recent falls or injury to the area.  The case was discussed with Doctor Magnus IvanBlackman, on call for orthopedics. After discussing the patient's current findings, x-rays Doctor Magnus IvanBlackman and thought that the patient could be given an IV dose of clindamycin and then discharge with by mouth Cipro and doxycycline. He recommends a Cam Walker so the patient can take it off, do daily soaks and apply ointment to the area. He will see the patient in the office Wednesday. Patient is to call for appointment.  Gilda Creasehristopher J. Pollina, MD 01/31/14 Barry Brunner1935

## 2014-02-14 ENCOUNTER — Emergency Department (HOSPITAL_COMMUNITY)
Admission: EM | Admit: 2014-02-14 | Discharge: 2014-02-16 | Disposition: A | Discharge status: Home / Self Care | Payer: Medicare Other | Attending: Emergency Medicine | Admitting: Emergency Medicine

## 2014-02-14 ENCOUNTER — Encounter (HOSPITAL_COMMUNITY): Payer: Self-pay | Admitting: Emergency Medicine

## 2014-02-14 DIAGNOSIS — F10929 Alcohol use, unspecified with intoxication, unspecified: Secondary | ICD-10-CM

## 2014-02-14 DIAGNOSIS — I10 Essential (primary) hypertension: Secondary | ICD-10-CM | POA: Insufficient documentation

## 2014-02-14 DIAGNOSIS — Z792 Long term (current) use of antibiotics: Secondary | ICD-10-CM | POA: Insufficient documentation

## 2014-02-14 DIAGNOSIS — Z79899 Other long term (current) drug therapy: Secondary | ICD-10-CM | POA: Insufficient documentation

## 2014-02-14 DIAGNOSIS — Y9289 Other specified places as the place of occurrence of the external cause: Secondary | ICD-10-CM | POA: Insufficient documentation

## 2014-02-14 DIAGNOSIS — Y939 Activity, unspecified: Secondary | ICD-10-CM | POA: Insufficient documentation

## 2014-02-14 DIAGNOSIS — S0190XA Unspecified open wound of unspecified part of head, initial encounter: Secondary | ICD-10-CM | POA: Insufficient documentation

## 2014-02-14 DIAGNOSIS — F1010 Alcohol abuse, uncomplicated: Secondary | ICD-10-CM | POA: Insufficient documentation

## 2014-02-14 DIAGNOSIS — Z87440 Personal history of urinary (tract) infections: Secondary | ICD-10-CM | POA: Insufficient documentation

## 2014-02-14 DIAGNOSIS — X58XXXA Exposure to other specified factors, initial encounter: Secondary | ICD-10-CM | POA: Insufficient documentation

## 2014-02-14 HISTORY — DX: Alcohol abuse, uncomplicated: F10.10

## 2014-02-14 LAB — BASIC METABOLIC PANEL
BUN: 11 mg/dL (ref 6–23)
CALCIUM: 8.6 mg/dL (ref 8.4–10.5)
CHLORIDE: 103 meq/L (ref 96–112)
CO2: 25 mEq/L (ref 19–32)
CREATININE: 0.73 mg/dL (ref 0.50–1.35)
GFR calc non Af Amer: >90 mL/min (ref >90)
Glucose, Bld: 81 mg/dL (ref 70–99)
Potassium: 4.1 mEq/L (ref 3.7–5.3)
Sodium: 144 mEq/L (ref 137–147)

## 2014-02-14 LAB — URINALYSIS, ROUTINE W REFLEX MICROSCOPIC
Bilirubin Urine: NEGATIVE
Glucose, UA: NEGATIVE mg/dL
Hgb urine dipstick: NEGATIVE
Ketones, ur: NEGATIVE mg/dL
LEUKOCYTES UA: NEGATIVE
NITRITE: NEGATIVE
PROTEIN: NEGATIVE mg/dL
Specific Gravity, Urine: 1.01 (ref 1.005–1.030)
Urobilinogen, UA: 0.2 mg/dL (ref 0.0–1.0)
pH: 5 (ref 5.0–8.0)

## 2014-02-14 LAB — CBC WITH DIFFERENTIAL/PLATELET
BASOS ABS: 0 10*3/uL (ref 0.0–0.1)
Basophils Relative: 0 % (ref 0–1)
EOS PCT: 1 % (ref 0–5)
Eosinophils Absolute: 0 10*3/uL (ref 0.0–0.7)
HEMATOCRIT: 39 % (ref 39.0–52.0)
HEMOGLOBIN: 12.9 g/dL — AB (ref 13.0–17.0)
Lymphocytes Relative: 40 % (ref 12–46)
Lymphs Abs: 2.6 10*3/uL (ref 0.7–4.0)
MCH: 30.9 pg (ref 26.0–34.0)
MCHC: 33.1 g/dL (ref 30.0–36.0)
MCV: 93.5 fL (ref 78.0–100.0)
MONO ABS: 0.4 10*3/uL (ref 0.1–1.0)
MONOS PCT: 6 % (ref 3–12)
NEUTROS ABS: 3.4 10*3/uL (ref 1.7–7.7)
Neutrophils Relative %: 53 % (ref 43–77)
Platelets: 318 10*3/uL (ref 150–400)
RBC: 4.17 MIL/uL — ABNORMAL LOW (ref 4.22–5.81)
RDW: 15.9 % — AB (ref 11.5–15.5)
WBC: 6.4 10*3/uL (ref 4.0–10.5)

## 2014-02-14 LAB — RAPID URINE DRUG SCREEN, HOSP PERFORMED
Amphetamines: NOT DETECTED
BARBITURATES: NOT DETECTED
BENZODIAZEPINES: NOT DETECTED
Cocaine: NOT DETECTED
Opiates: NOT DETECTED
Tetrahydrocannabinol: NOT DETECTED

## 2014-02-14 LAB — ETHANOL: ALCOHOL ETHYL (B): 347 mg/dL — AB (ref 0–11)

## 2014-02-14 MED ORDER — LORAZEPAM 1 MG PO TABS: 0.0000 mg | ORAL_TABLET | Freq: Two times a day (BID) | ORAL | Status: DC

## 2014-02-14 MED ORDER — FLUOXETINE HCL 20 MG PO CAPS
40.0000 mg | ORAL_CAPSULE | Freq: Every day | ORAL | Status: DC
  Administered 2014-02-14 – 2014-02-16 (×3): 40 mg via ORAL
  Filled 2014-02-14 (×3): qty 2

## 2014-02-14 MED ORDER — LORAZEPAM 1 MG PO TABS: 1.0000 mg | ORAL_TABLET | Freq: Three times a day (TID) | ORAL | Status: DC | PRN

## 2014-02-14 MED ORDER — NICOTINE 21 MG/24HR TD PT24
21.0000 mg | MEDICATED_PATCH | Freq: Every day | TRANSDERMAL | Status: DC
  Filled 2014-02-14 (×2): qty 1

## 2014-02-14 MED ORDER — IBUPROFEN 200 MG PO TABS: 600.0000 mg | ORAL_TABLET | Freq: Three times a day (TID) | ORAL | Status: DC | PRN

## 2014-02-14 MED ORDER — ZOLPIDEM TARTRATE 5 MG PO TABS: 5.0000 mg | ORAL_TABLET | Freq: Every evening | ORAL | Status: DC | PRN

## 2014-02-14 MED ORDER — VITAMIN B-1 100 MG PO TABS
100.0000 mg | ORAL_TABLET | Freq: Every day | ORAL | Status: DC
  Administered 2014-02-14 – 2014-02-16 (×3): 100 mg via ORAL
  Filled 2014-02-14 (×3): qty 1

## 2014-02-14 MED ORDER — ALUM & MAG HYDROXIDE-SIMETH 200-200-20 MG/5ML PO SUSP: 30.0000 mL | ORAL | Status: DC | PRN

## 2014-02-14 MED ORDER — ACETAMINOPHEN 325 MG PO TABS: 650.0000 mg | ORAL_TABLET | ORAL | Status: DC | PRN

## 2014-02-14 MED ORDER — ONDANSETRON HCL 4 MG PO TABS
4.0000 mg | ORAL_TABLET | Freq: Three times a day (TID) | ORAL | Status: DC | PRN
Start: 2014-02-14 — End: 2014-02-16

## 2014-02-14 MED ORDER — LORAZEPAM 1 MG PO TABS: 0.0000 mg | ORAL_TABLET | Freq: Four times a day (QID) | ORAL | Status: DC

## 2014-02-14 NOTE — ED Notes (Addendum)
Patient ambulated to the restroom with one assist. He is very unsteady. Unable to ambulate independently.

## 2014-02-14 NOTE — ED Notes (Signed)
MD at bedside. 

## 2014-02-14 NOTE — BH Assessment (Signed)
Tele Assessment Note   Ralph Jackson is a 68 y.o. male [facility].  Pt denies SI/HI/AVH.  Pt brought in [name], after being found by passersby.  Pt was passed out in his front yard.  Pt has an unsteady gait and fell, he has a laceration to lower right side of his head.  Pt says he drinks a 1/5 of liquor and 2-24oz cans of beer, daily.  Pt.'s last intake was 02/14/14, he drank 1/5 and 1-24oz can of beer.  Pt.'s bal was 347, resulting at 1358 pm.  Pt says he has several detox admissions, but can't remember where or when they were, [facility].  Pt denies any current w/d sxs, no seizure or blackout hx or legal issues    Axis I: Alcohol Use D/O, Severe  Axis II: Deferred Axis III:  Past Medical History  Diagnosis Date  . Hypertension   . Urinary tract infection   . ETOH abuse    Axis IV: other psychosocial or environmental problems, problems related to social environment and problems with primary support group Axis V: 41-50 serious symptoms  Past Medical History:  Past Medical History  Diagnosis Date  . Hypertension   . Urinary tract infection   . ETOH abuse     History reviewed. No pertinent past surgical history.  Family History: History reviewed. No pertinent family history.  Social History:  reports that he has never smoked. His smokeless tobacco use includes Chew. He reports that he drinks alcohol. He reports that he does not use illicit drugs.  Additional Social History:  Alcohol / Drug Use Pain Medications: See MAR  Prescriptions: See MAR  Over the Counter: See MAR  History of alcohol / drug use?: Yes Longest period of sobriety (when/how long): Inpt admissions with detox facilities Negative Consequences of Use: Personal relationships Withdrawal Symptoms: Other (Comment) (No current w/d sxs ) Substance #1 Name of Substance 1: Alcohol  1 - Age of First Use: 20 YOM  1 - Amount (size/oz): 1/5 & 2-24oz beers 1 -  Frequency: Daily  1 - Duration: On-going  1 - Last Use / Amount: 02/14/14  CIWA: CIWA-Ar BP: 114/62 mmHg Pulse Rate: 62 Nausea and Vomiting: no nausea and no vomiting Tactile Disturbances: none Tremor: no tremor Auditory Disturbances: not present Paroxysmal Sweats: no sweat visible Visual Disturbances: not present Anxiety: no anxiety, at ease Headache, Fullness in Head: none present Agitation: normal activity Orientation and Clouding of Sensorium: oriented and can do serial additions CIWA-Ar Total: 0 COWS:    Allergies: No Known Allergies  Home Medications:  (Not in a hospital admission)  OB/GYN Status:  No LMP for male patient.  General Assessment Data Location of Assessment: [facility] Is this a Tele or Face-to-Face Assessment?: Face-to-Face Is this an Initial Assessment or a Re-assessment for this encounter?: Initial Assessment Living Arrangements: Alone Can pt return to current living arrangement?: Yes Admission Status: Voluntary Is patient capable of signing voluntary admission?: Yes Transfer from: Acute Hospital Referral Source: MD  Medical Screening Exam [facility] Walk-in ONLY) Medical Exam completed: No Reason for MSE not completed: Other: (None )  [facility] Crisis [facility] Living Arrangements: Alone Name of Psychiatrist: Noe  Name of Therapist: None   Education Status Is patient currently in school?: No Current Grade: None  Highest grade of school patient has completed: None  Name of school: None  Contact person: None   Risk to self Suicidal Ideation:  No Suicidal Intent: No Is patient at risk for suicide?: No Suicidal Plan?: No Access to Means: No Specify Access to Suicidal Means: None  What has been your use of drugs/alcohol within the last 12 months?: Abusing: alcohol  Previous Attempts/Gestures: No How many times?: 0 Other Self Harm Risks: None  Triggers for Past Attempts: None known Intentional Self Injurious Behavior: None Family Suicide History:  No Recent stressful life event(s): Other (Comment) (Extensive alcohol hx ) Persecutory voices/beliefs?: No Depression: No Depression Symptoms:  (None reported ) Substance abuse history and/or treatment for substance abuse?: Yes Suicide prevention information given to non-admitted patients: Not applicable  Risk to Others Homicidal Ideation: No Thoughts of Harm to Others: No Current Homicidal Intent: No Current Homicidal Plan: No Access to Homicidal Means: No Identified Victim: None  History of harm to others?: No Assessment of Violence: None Noted Violent Behavior Description: None  Does patient have access to weapons?: No Criminal Charges Pending?: No Does patient have a court date: No  Psychosis Hallucinations: None noted Delusions: None noted  Mental Status Report Appear/Hygiene: Disheveled;In scrubs Eye Contact: Good Motor Activity: Unsteady Speech: Logical/coherent;Slurred Level of Consciousness: Alert Mood: Other (Comment) (Appropriate ) Affect: Appropriate to circumstance Anxiety Level: None Thought Processes: Relevant Judgement: Unimpaired Orientation: Person;Place;Situation Obsessive Compulsive Thoughts/Behaviors: None  Cognitive Functioning Concentration: Decreased Memory: Remote Impaired;Recent Impaired IQ: Average Insight: Good Impulse Control: Fair Appetite: Poor Weight Loss: 0 Weight Gain: 0 Sleep: No Change Total Hours of Sleep: 6 Vegetative Symptoms: None  ADLScreening [facility] Assessment Services) Patient's cognitive ability adequate to safely complete daily activities?: Yes Patient able to express need for assistance with ADLs?: Yes Independently performs ADLs?: Yes (appropriate for developmental age)  Prior Inpatient Therapy Prior Inpatient Therapy: Yes Prior Therapy Dates: 2011, various other dates  [facility]; various other facilities  Reason for Treatment: Detox/Rehab   Prior Outpatient Therapy Prior  Outpatient Therapy: No Prior Therapy Dates: None  Prior Therapy Facilty/Provider(s): None  Reason for Treatment: None   ADL Screening (condition at time of admission) Patient's cognitive ability adequate to safely complete daily activities?: Yes Is the patient deaf or have difficulty hearing?: No Does the patient have difficulty seeing, even when wearing glasses/contacts?: No Does the patient have difficulty concentrating, remembering, or making decisions?: Yes Patient able to express need for assistance with ADLs?: Yes Does the patient have difficulty dressing or bathing?: No Independently performs ADLs?: Yes (appropriate for developmental age) Does the patient have difficulty walking or climbing stairs?: No Weakness of Legs: Both (Unsteady gait due to intoxication ) Weakness of Arms/Hands: None  Home Assistive Devices/Equipment Home Assistive Devices/Equipment: None  Therapy Consults (therapy consults require a physician order) PT Evaluation Needed: No OT Evalulation Needed: No SLP Evaluation Needed: No Abuse/Neglect Assessment (Assessment to be complete while patient is alone) Physical Abuse: Denies Verbal Abuse: Denies Sexual Abuse: Denies Exploitation of patient/patient's resources: Denies Self-Neglect: Denies Values / Beliefs Cultural Requests During Hospitalization: None Spiritual Requests During Hospitalization: None Consults Spiritual Care Consult Needed: No Social Work Consult Needed: No Merchant navy officerAdvance Directives (For Healthcare) Advance Directive: Patient does not have advance directive;Patient would not like information Pre-existing out of facility DNR order (yellow form or pink MOST form): No Nutrition Screen- [facility] Patient's home diet: Regular  Additional Information 1:1 In Past 12 Months?: No CIRT Risk: No Elopement Risk: No Does patient have medical clearance?: Yes     Disposition:  Disposition Initial Assessment Completed for this Encounter:  Yes Disposition of Patient: Inpatient treatment  program;Referred to [facility] ) Type of inpatient treatment program: Adult Patient referred to: Other (Comment) [facility] )  Murrell Redden 02/14/2014 8:41 PM

## 2014-02-14 NOTE — ED Provider Notes (Addendum)
[name]: [id number]     Arrival date & time 02/14/14  1334 History   First MD Initiated Contact with Patient 02/14/14 1344     Chief Complaint  Patient presents with  . Alcohol Intoxication     (Consider location/radiation/quality/duration/timing/severity/associated sxs/prior Treatment) Patient is a 68 y.o. male presenting with intoxication. The history is provided by the patient.  Alcohol Intoxication This is a chronic problem. Episode onset: years. The problem occurs constantly. The problem has not changed since onset.Pertinent negatives include no chest pain, no abdominal pain, no headaches and no shortness of breath. Associated symptoms comments: Pt states he drinks a 5th of wine a day for years.  Larey SeatFell in the yard today and cut his head.  Denies LOC.  Tetanus UTD. Nothing aggravates the symptoms. Nothing relieves the symptoms. He has tried nothing for the symptoms. The treatment provided no relief.    Past Medical History  Diagnosis Date  . Hypertension   . Urinary tract infection   . ETOH abuse    History reviewed. No pertinent past surgical history. History reviewed. No pertinent family history. History  Substance Use Topics  . Smoking status: Never Smoker   . Smokeless tobacco: Current User    Types: Chew  . Alcohol Use: Yes     Comment: heavy    Review of Systems  Respiratory: Negative for shortness of breath.   Cardiovascular: Negative for chest pain.  Gastrointestinal: Negative for abdominal pain.  Neurological: Negative for headaches.  All other systems reviewed and are negative.     Allergies  Review of patient's allergies indicates no known allergies.  Home Medications   Prior to Admission medications   Medication Sig Start Date End Date Taking? Authorizing Provider  cholecalciferol (VITAMIN D) 1000 UNITS tablet Take 1,000 Units by mouth daily.    Historical Provider, MD  ciprofloxacin (CIPRO) 500 MG tablet Take 1 tablet (500 mg total) by mouth 2 (two) times  daily. 01/31/14   Gilda Creasehristopher J. Pollina, MD  doxycycline (VIBRAMYCIN) 100 MG capsule Take 1 capsule (100 mg total) by mouth 2 (two) times daily. 01/31/14   Gilda Creasehristopher J. Pollina, MD  FLUoxetine (PROZAC) 40 MG capsule Take 40 mg by mouth daily.    Historical Provider, MD  mupirocin cream (BACTROBAN) 2 % Apply 1 application topically 2 (two) times daily. 01/31/14   Gilda Creasehristopher J. Pollina, MD   BP 113/70  Pulse 80  Temp(Src) 98.7 F (37.1 C) (Oral)  Resp 20  SpO2 98% Physical Exam  Nursing note and vitals reviewed. Constitutional: He is oriented to person, place, and time. He appears well-developed. He appears cachectic. No distress.  HENT:  Head: Normocephalic. Head is with laceration.    Mouth/Throat: Oropharynx is clear and moist.  Eyes: Conjunctivae and EOM are normal. Pupils are equal, round, and reactive to light.  Neck: Normal range of motion. Neck supple.  Cardiovascular: Normal rate, regular rhythm and intact distal pulses.   No murmur heard. Pulmonary/Chest: Effort normal and breath sounds normal. No respiratory distress. He has no wheezes. He has no rales.  Abdominal: Soft. He exhibits no distension. There is no tenderness. There is no rebound and no guarding.  Musculoskeletal: Normal range of motion. He exhibits no edema and no tenderness.  Neurological: He is alert and oriented to person, place, and time.  Skin: Skin is warm and dry. No rash noted. No erythema.  Psychiatric: He has a normal mood and affect. His behavior is normal.    ED Course  Procedures (including  critical care time) Labs Review Labs Reviewed  CBC WITH DIFFERENTIAL - Abnormal; Notable for the following:    RBC 4.17 (*)    Hemoglobin 12.9 (*)    RDW 15.9 (*)    All other components within normal limits  ETHANOL - Abnormal; Notable for the following:    Alcohol, Ethyl (B) 347 (*)    All other components within normal limits  BASIC METABOLIC PANEL  URINE RAPID DRUG SCREEN (HOSP PERFORMED)    URINALYSIS, ROUTINE W REFLEX MICROSCOPIC    Imaging Review No results found.   EKG Interpretation None      MDM   Final diagnoses:  Alcohol intoxication  Alcohol abuse    Patient with a history of heavy alcohol consumption approximately a pint of wine daily presents requesting detox. Is found him lying in his yard intoxicated and called police. EMS brought patient here. He is requesting detox and able to give a full history. He denies any headache, neck pain, abdominal pain or vomiting. Vital signs are within normal limits. A small laceration to the right side of his is otherwise neurovascularly intact. He takes no anticoagulation. Last time patient attempted to detox was years ago. Tetanus shot is up-to-date.  Will get medical clearance labs pending  3:11 PM Labs normal except for EtOH of 350.  Laceration is small and shallow with no need for repair at this time.  Will allow pt to sober up to see if he is serious about detox.  Gwyneth SproutWhitney Esmay Amspacher, MD 02/14/14 1512  Gwyneth SproutWhitney Samreet Edenfield, MD 02/14/14 1513

## 2014-02-14 NOTE — ED Provider Notes (Signed)
6:30 PM  Assumed care from Dr. Anitra LauthPlunkett.  Pt is a 68 y.o. M with h/o ETOH abuse reports he drinks a pint of wine a day. She was requesting detox but was intoxicated. Have reassessed the patient and he is now more awake, answers questions appropriately. He has no medical complaints but states he would like detox with rehabilitation. No SI or HI. Patient's labs are otherwise unremarkable. Drug screen pending. We'll consult TTS as patient will likely need inpatient detox.  Layla MawKristen N Giuseppe Duchemin, DO 02/15/14 [phone]

## 2014-02-14 NOTE — ED Notes (Signed)
MD at bedside.  EDP PLUNKETT TO REVALUATE-

## 2014-02-14 NOTE — ED Notes (Signed)
Patient was found by passerby in patient's front yard.  Passerby called police and police called [name].  Patient fell, laceration to right lower side of head.  Patient is requesting detox.  CBG-82.  Unwitnessed fall.  Patient reports drinking a bottle of wine and beer.

## 2014-02-14 NOTE — ED Notes (Signed)
[facility] Expected date:  Expected time:  Means of arrival:  Comments: ems- ETOH intoxication, detox

## 2014-02-15 ENCOUNTER — Emergency Department (HOSPITAL_COMMUNITY): Payer: Medicare Other

## 2014-02-15 DIAGNOSIS — F1020 Alcohol dependence, uncomplicated: Secondary | ICD-10-CM

## 2014-02-15 LAB — HEPATIC FUNCTION PANEL
ALT: 13 U/L (ref 0–53)
AST: 18 U/L (ref 0–37)
Albumin: 3.2 g/dL — ABNORMAL LOW (ref 3.5–5.2)
Alkaline Phosphatase: 138 U/L — ABNORMAL HIGH (ref 39–117)
TOTAL PROTEIN: 6.5 g/dL (ref 6.0–8.3)
Total Bilirubin: 0.8 mg/dL (ref 0.3–1.2)

## 2014-02-15 NOTE — Consult Note (Signed)
Review of Systems   Constitutional: Negative.    HENT: Negative.    Eyes: Negative.    Respiratory: Negative.    Cardiovascular: Negative.    Gastrointestinal: Negative.    Genitourinary: Negative.    Musculoskeletal: Negative.    Skin: Negative.    Neurological: Negative.    Endo/Heme/Allergies: Negative.    Psychiatric/Behavioral: Positive for substance abuse.

## 2014-02-15 NOTE — BH Assessment (Signed)
Pt was considered for obs unit but due to medical issues and pt needing hepatic function test per [facility], pt needs to be placed at other facilities.   Glorious PeachNajah Presley, MS, LCASA Assessment Counselor

## 2014-02-15 NOTE — ED Notes (Signed)
Ate 100% of breakfast

## 2014-02-15 NOTE — ED Provider Notes (Signed)
Pt with Rt Lateral Malleolus fracture 6-1.  Walked with [facility] for 10 days and pain resolved.  States it is "a little sore" now.  Repeat X ray today shows healing of fracture.  Exam shows healed wound over Rt Lateral distal fibula.  Does not appear infected:  ie not fluctuant, cellulitic, erythematous, or tender.  Alk Phos minimally elevated, with history of same.  Remainder of Hepatobiliary enzymes are normal.  Impression 1)  Alcoholism with request for detox.                    2)  Healing Fibula fracture.  Pt able to ambulate without abnormal gait.  This does not                                     require further care at this time.  No medical contraindications to Detox.     Tanna Furry, MD 02/15/14 2003

## 2014-02-15 NOTE — Progress Notes (Signed)
[place] [phone]. Per Kathlyn there are available beds. Writer faxed referrals to [phone].   [facility] [phone]. Per Johnathan there are available beds. Writer faxed referrals to [phone].   [facility] [phone]. Per Rosalyn they are accepting patients from their wait list. Writer faxed referral so that the patient can be reviewed and then placed on their wait list. The fax number is [phone].   [facility] [phone], writer left a message with the intake coordinator (Darlene).   [facility] [phone]. Per Stevie, there are available beds. Writer faxed referrals to [phone].       

## 2014-02-15 NOTE — ED Notes (Signed)
Assumed care of patient s/p report Patient resting in position of comfort with eyes closed RR WNL--even and unlabored with equal rise and fall of chest Patient in NAD Side rails up  Patient was supposed to be moved for IP detox, but due to previous LE fracture on 6/1, placement now dependent on results of xray and any tx r/t results of imaging Will inform ED Charge Nurse

## 2014-02-15 NOTE — Consult Note (Signed)
[facility] Face-to-Face Psychiatry Consult   Reason for Consult:  Requesting alcohol detox Referring Physician:  ER MD  Ralph Jackson is an 68 y.o. male. Total Time spent with patient: 30 minutes  Assessment: AXIS I:  alcohol dependence AXIS II:  Deferred AXIS III:   Past Medical History  Diagnosis Date  . Hypertension   . Urinary tract infection   . ETOH abuse    AXIS IV:  problems with addiction AXIS V:  41-50 serious symptoms  Plan:  seek detox bed  Subjective:   Ralph Jackson is a 68 y.o. male patient admitted with request for detox.  HPI:  Ralph Jackson says his only issue is alcohol addiction and he needs detox.  Says he drinks a fifth a day plus beer.  His last detox was a year or so ago and he would like rehab after detox.  Denies suicidal ideation and is not psychotic.  His last drink was yesterday. HPI Elements:   Location:  alcohol addiction. Quality:  drinking daily. Severity:  a fifth of alcohol plus beers. Timing:  years problem. Duration:  years. Context:  as above.  Past Psychiatric History: Past Medical History  Diagnosis Date  . Hypertension   . Urinary tract infection   . ETOH abuse     reports that he has never smoked. His smokeless tobacco use includes Chew. He reports that he drinks alcohol. He reports that he does not use illicit drugs. History reviewed. No pertinent family history. Family History Substance Abuse: No Family Supports: No Living Arrangements: Alone Can pt return to current living arrangement?: Yes Abuse/Neglect [facility]) Physical Abuse: Denies Verbal Abuse: Denies Sexual Abuse: Denies Allergies:  No Known Allergies  ACT Assessment Complete:  Yes:    Educational Status    Risk to Self: Risk to self Suicidal Ideation: No Suicidal Intent: No Is patient at risk for suicide?: No Suicidal Plan?: No Access to Means: No Specify Access to Suicidal Means: None  What has been your use of drugs/alcohol within the last 12 months?:  Abusing: alcohol  Previous Attempts/Gestures: No How many times?: 0 Other Self Harm Risks: None  Triggers for Past Attempts: None known Intentional Self Injurious Behavior: None Family Suicide History: No Recent stressful life event(s): Other (Comment) (Extensive alcohol hx ) Persecutory voices/beliefs?: No Depression: No Depression Symptoms:  (None reported ) Substance abuse history and/or treatment for substance abuse?: Yes Suicide prevention information given to non-admitted patients: Not applicable  Risk to Others: Risk to Others Homicidal Ideation: No Thoughts of Harm to Others: No Current Homicidal Intent: No Current Homicidal Plan: No Access to Homicidal Means: No Identified Victim: None  History of harm to others?: No Assessment of Violence: None Noted Violent Behavior Description: None  Does patient have access to weapons?: No Criminal Charges Pending?: No Does patient have a court date: No  Abuse: Abuse/Neglect Assessment (Assessment to be complete while patient is alone) Physical Abuse: Denies Verbal Abuse: Denies Sexual Abuse: Denies Exploitation of patient/patient's resources: Denies Self-Neglect: Denies  Prior Inpatient Therapy: Prior Inpatient Therapy Prior Inpatient Therapy: Yes Prior Therapy Dates: 2011, various other dates  Prior Therapy Facilty/Provider(s): [facility]; various other facilities  Reason for Treatment: Detox/Rehab   Prior Outpatient Therapy: Prior Outpatient Therapy Prior Outpatient Therapy: No Prior Therapy Dates: None  Prior Therapy Facilty/Provider(s): None  Reason for Treatment: None   Additional Information: Additional Information 1:1 In Past 12 Months?: No CIRT Risk: No Elopement Risk: No Does patient have medical clearance?: Yes  Objective: Blood pressure 140/74, pulse 65, temperature 97.6 F (36.4 C), temperature source Oral, resp. rate 16, SpO2 98.00%.There is no height or weight on file to calculate  BMI. Results for orders placed during the hospital encounter of 02/14/14 (from the past 72 hour(s))  CBC WITH DIFFERENTIAL     Status: Abnormal   Collection Time    02/14/14  1:58 PM      Result Value Ref Range   WBC 6.4  4.0 - 10.5 K/uL   RBC 4.17 (*) 4.22 - 5.81 MIL/uL   Hemoglobin 12.9 (*) 13.0 - 17.0 g/dL   HCT 39.0  39.0 - 52.0 %   MCV 93.5  78.0 - 100.0 fL   MCH 30.9  26.0 - 34.0 pg   MCHC 33.1  30.0 - 36.0 g/dL   RDW 15.9 (*) 11.5 - 15.5 %   Platelets 318  150 - 400 K/uL   Neutrophils Relative % 53  43 - 77 %   Neutro Abs 3.4  1.7 - 7.7 K/uL   Lymphocytes Relative 40  12 - 46 %   Lymphs Abs 2.6  0.7 - 4.0 K/uL   Monocytes Relative 6  3 - 12 %   Monocytes Absolute 0.4  0.1 - 1.0 K/uL   Eosinophils Relative 1  0 - 5 %   Eosinophils Absolute 0.0  0.0 - 0.7 K/uL   Basophils Relative 0  0 - 1 %   Basophils Absolute 0.0  0.0 - 0.1 K/uL  BASIC METABOLIC PANEL     Status: None   Collection Time    02/14/14  1:58 PM      Result Value Ref Range   Sodium 144  137 - 147 mEq/L   Potassium 4.1  3.7 - 5.3 mEq/L   Chloride 103  96 - 112 mEq/L   CO2 25  19 - 32 mEq/L   Glucose, Bld 81  70 - 99 mg/dL   BUN 11  6 - 23 mg/dL   Creatinine, Ser 0.73  0.50 - 1.35 mg/dL   Calcium 8.6  8.4 - 10.5 mg/dL   GFR calc non Af Amer >90  >90 mL/min   GFR calc Af Amer >90  >90 mL/min   Comment: (NOTE)     The eGFR has been calculated using the CKD EPI equation.     This calculation has not been validated in all clinical situations.     eGFR's persistently <90 mL/min signify possible Chronic Kidney     Disease.  ETHANOL     Status: Abnormal   Collection Time    02/14/14  1:58 PM      Result Value Ref Range   Alcohol, Ethyl (B) 347 (*) 0 - 11 mg/dL   Comment:            LOWEST DETECTABLE LIMIT FOR     SERUM ALCOHOL IS 11 mg/dL     FOR MEDICAL PURPOSES ONLY  URINE RAPID DRUG SCREEN (HOSP PERFORMED)     Status: None   Collection Time    02/14/14  7:25 PM      Result Value Ref Range    Opiates NONE DETECTED  NONE DETECTED   Cocaine NONE DETECTED  NONE DETECTED   Benzodiazepines NONE DETECTED  NONE DETECTED   Amphetamines NONE DETECTED  NONE DETECTED   Tetrahydrocannabinol NONE DETECTED  NONE DETECTED   Barbiturates NONE DETECTED  NONE DETECTED   Comment:  DRUG SCREEN FOR MEDICAL PURPOSES     ONLY.  IF CONFIRMATION IS NEEDED     FOR ANY PURPOSE, NOTIFY LAB     WITHIN 5 DAYS.                LOWEST DETECTABLE LIMITS     FOR URINE DRUG SCREEN     Drug Class       Cutoff (ng/mL)     Amphetamine      1000     Barbiturate      200     Benzodiazepine   552     Tricyclics       080     Opiates          300     Cocaine          300     THC              50  URINALYSIS, ROUTINE W REFLEX MICROSCOPIC     Status: None   Collection Time    02/14/14  7:25 PM      Result Value Ref Range   Color, Urine YELLOW  YELLOW   APPearance CLEAR  CLEAR   Specific Gravity, Urine 1.010  1.005 - 1.030   pH 5.0  5.0 - 8.0   Glucose, UA NEGATIVE  NEGATIVE mg/dL   Hgb urine dipstick NEGATIVE  NEGATIVE   Bilirubin Urine NEGATIVE  NEGATIVE   Ketones, ur NEGATIVE  NEGATIVE mg/dL   Protein, ur NEGATIVE  NEGATIVE mg/dL   Urobilinogen, UA 0.2  0.0 - 1.0 mg/dL   Nitrite NEGATIVE  NEGATIVE   Leukocytes, UA NEGATIVE  NEGATIVE   Comment: MICROSCOPIC NOT DONE ON URINES WITH NEGATIVE PROTEIN, BLOOD, LEUKOCYTES, NITRITE, OR GLUCOSE <1000 mg/dL.   Labs are reviewed and are pertinent for blood alcohol of 347.  Current Facility-Administered Medications  Medication Dose Route Frequency Provider Last Rate Last Dose  . acetaminophen (TYLENOL) tablet 650 mg  650 mg Oral Q4H PRN Kristen N Ward, DO      . alum & mag hydroxide-simeth (MAALOX/MYLANTA) 200-200-20 MG/5ML suspension 30 mL  30 mL Oral PRN Kristen N Ward, DO      . FLUoxetine (PROZAC) capsule 40 mg  40 mg Oral Daily Kristen N Ward, DO   40 mg at 02/15/14 0931  . ibuprofen (ADVIL,MOTRIN) tablet 600 mg  600 mg Oral Q8H PRN Kristen N Ward,  DO      . LORazepam (ATIVAN) tablet 0-4 mg  0-4 mg Oral 4 times per day Kristen N Ward, DO      . LORazepam (ATIVAN) tablet 0-4 mg  0-4 mg Oral Q12H Kristen N Ward, DO      . nicotine (NICODERM CQ - dosed in mg/24 hours) patch 21 mg  21 mg Transdermal Daily Kristen N Ward, DO      . ondansetron (ZOFRAN) tablet 4 mg  4 mg Oral Q8H PRN Kristen N Ward, DO      . thiamine (VITAMIN B-1) tablet 100 mg  100 mg Oral Daily Kristen N Ward, DO   100 mg at 02/15/14 2233   Current Outpatient Prescriptions  Medication Sig Dispense Refill  . cholecalciferol (VITAMIN D) 1000 UNITS tablet Take 1,000 Units by mouth daily.      Marland Kitchen FLUoxetine (PROZAC) 40 MG capsule Take 40 mg by mouth daily.      . mupirocin cream (BACTROBAN) 2 % Apply 1 application topically 2 (two) times daily.  15 g  0    Psychiatric Specialty Exam:     Blood pressure 140/74, pulse 65, temperature 97.6 F (36.4 C), temperature source Oral, resp. rate 16, SpO2 98.00%.There is no height or weight on file to calculate BMI.  General Appearance: Casual  Eye Contact::  Good  Speech:  Clear and Coherent  Volume:  Normal  Mood:  Anxious  Affect:  Appropriate  Thought Process:  Coherent  Orientation:  Full (Time, Place, and Person)  Thought Content:  Negative  Suicidal Thoughts:  No  Homicidal Thoughts:  No  Memory:  Immediate;   Good Recent;   Good Remote;   Good  Judgement:  Good  Insight:  Good  Psychomotor Activity:  Normal  Concentration:  Good  Recall:  Good  Fund of Knowledge:Good  Language: Good  Akathisia:  Negative  Handed:  Right  AIMS (if indicated):     Assets:  Communication Skills Desire for Improvement Housing Physical Health  Sleep:      Musculoskeletal: Strength & Muscle Tone: within normal limits Gait & Station: normal Patient leans: N/A  Treatment Plan Summary: recommend inpatient detox from alcohol  TAYLOR,GERALD D 02/15/2014 2:00 PM

## 2014-02-15 NOTE — ED Notes (Signed)
Patient transported to X-ray 

## 2014-02-15 NOTE — ED Notes (Signed)
Patient resting in position of comfort with eyes closed RR WNL--even and unlabored with equal rise and fall of chest Patient in NAD Side rails up 

## 2014-02-15 NOTE — ED Notes (Signed)
[name]/psych ED called regarding status of right leg fracture that occurred 01/31/14. Patient questioned and states he had a cast and he cut it off at home. Ralph BergerShavonne also requested that the patient have a hepatic function panel drawn and an x-ray for right leg.

## 2014-02-15 NOTE — ED Notes (Signed)
Assumed care of patient s/p report  Patient resting in position of comfort with eyes closed RR WNL--even and unlabored with equal rise and fall of chest Patient in NAD Side rails up

## 2014-02-15 NOTE — ED Notes (Signed)
Dr. Fayrene FearingJames notified of psych request for an x-ray of his right leg and a hepatic function panel.

## 2014-02-15 NOTE — Progress Notes (Signed)
  CARE MANAGEMENT ED NOTE 02/15/2014  Patient:  Ralph Jackson,Ralph Jackson   Account Number:  [id number]  Date Initiated:  02/15/2014  Documentation initiated by:  Radford PaxFERRERO,AMY  Subjective/Objective Assessment:   Patient presents to Ed with ETOH, fall sustaining laceration to right head     Subjective/Objective Assessment Detail:     Action/Plan:   Action/Plan Detail:   Anticipated DC Date:       Status Recommendation to Physician:   Result of Recommendation:    Other ED Services  Consult Working Plan    DC Planning Services  Other  PCP issues    Choice offered to / List presented to:            Status of service:  Completed, signed off  ED Comments:   ED Comments Detail:  EDCM spoke to patient at bedside.  Patient confirms having Medicare insurance without pcp.  [facility] list of pcps who accept Medicare insurance within a 10  mile radius of patient's zip code [phone].  Patient thankful for resources.  No further EDCM needs at this time.

## 2014-02-15 NOTE — ED Notes (Signed)
Patient ambulated to the bathroom.

## 2014-02-16 NOTE — Consult Note (Signed)
  Psychiatric Specialty Exam: Physical Exam  ROS  Blood pressure 137/78, pulse 68, temperature 98.4 F (36.9 C), temperature source Oral, resp. rate 18, SpO2 97.00%.There is no height or weight on file to calculate BMI.  General Appearance: Casual  Eye Contact::  Good  Speech:  Clear and Coherent  Volume:  Normal  Mood:  Anxious  Affect:  Appropriate  Thought Process:  Coherent  Orientation:  Full (Time, Place, and Person)  Thought Content:  Negative  Suicidal Thoughts:  No  Homicidal Thoughts:  No  Memory:  Immediate;   Good Recent;   Good Remote;   Good  Judgement:  Intact  Insight:  Fair  Psychomotor Activity:  Normal  Concentration:  Good  Recall:  Good  Akathisia:  Negative  Handed:  Right  AIMS (if indicated):     Assets:  Communication Skills Desire for Improvement Housing Physical Health  Sleep:     Discharge home today, he will need to follow up on his own for rehab followup

## 2014-02-16 NOTE — ED Notes (Signed)
Patient resting in position of comfort with eyes closed RR WNL--even and unlabored with equal rise and fall of chest Patient in NAD Side rails up 

## 2014-02-16 NOTE — Consult Note (Signed)
Review of Systems   Constitutional: Negative.    HENT: Negative.    Eyes: Negative.    Respiratory: Negative.    Cardiovascular: Negative.    Gastrointestinal: Negative.    Genitourinary: Negative.    Musculoskeletal: Negative.    Skin: Negative.    Neurological: Negative.    Endo/Heme/Allergies: Negative.    Psychiatric/Behavioral: Positive for substance abuse.

## 2014-02-16 NOTE — [facility] Suicide Risk Assessment (Signed)
Suicide Risk Assessment  Discharge Assessment     Demographic Factors:  Male and Living alone  Total Time spent with patient: 30 minutes  Psychiatric Specialty Exam:     Blood pressure 137/78, pulse 68, temperature 98.4 F (36.9 C), temperature source Oral, resp. rate 18, SpO2 97.00%.There is no height or weight on file to calculate BMI.  General Appearance: Casual  Eye Contact::  Good  Speech:  Clear and Coherent  Volume:  Normal  Mood:  Euthymic  Affect:  Appropriate  Thought Process:  Negative  Orientation:  Full (Time, Place, and Person)  Thought Content:  Negative  Suicidal Thoughts:  No  Homicidal Thoughts:  No  Memory:  Immediate;   Good Recent;   Good Remote;   Good  Judgement:  Intact  Insight:  Fair  Psychomotor Activity:  Normal  Concentration:  Good  Recall:  Good  Fund of Knowledge:Good  Language: Negative  Akathisia:  Negative  Handed:  Right  AIMS (if indicated):     Assets:  Communication Skills Desire for Improvement Housing Intimacy Leisure Time Physical Health  Sleep:       Musculoskeletal: Strength & Muscle Tone: within normal limits Gait & Station: normal Patient leans: N/A   Mental Status Per Nursing Assessment::   On Admission:     Current Mental Status by Physician: NA  Loss Factors: NA  Historical Factors: NA  Risk Reduction Factors:   NA  Continued Clinical Symptoms:  Alcohol/Substance Abuse/Dependencies  Cognitive Features That Contribute To Risk:  none    Suicide Risk:  Minimal: No identifiable suicidal ideation.  Patients presenting with no risk factors but with morbid ruminations; may be classified as minimal risk based on the severity of the depressive symptoms  Discharge Diagnoses:   AXIS I:  alcohol dependence AXIS II:  Deferred AXIS III:   Past Medical History  Diagnosis Date  . Hypertension   . Urinary tract infection   . ETOH abuse    AXIS IV:  chronic addiction AXIS V:  61-70 mild  symptoms  Plan Of Care/Follow-up recommendations:  Activity:  resume usual activity Diet:  resume usual diet  Is patient on multiple antipsychotic therapies at discharge:  No   Has Patient had three or more failed trials of antipsychotic monotherapy by history:  No  Recommended Plan for Multiple Antipsychotic Therapies: NA    TAYLOR,GERALD D 02/16/2014, 11:37 AM

## 2014-02-16 NOTE — Progress Notes (Signed)
Per discussion with psychiatrist, patient psychiatrically stable for discharge home. CSW provided patient with outpatient resources and bus pass.   Byrd HesselbachKristen Reed, LCSW [phone]  ED CSW 02/16/2014 1014am

## 2014-03-23 ENCOUNTER — Encounter (HOSPITAL_COMMUNITY): Payer: Self-pay | Admitting: Emergency Medicine

## 2014-03-23 ENCOUNTER — Emergency Department (HOSPITAL_COMMUNITY)
Admission: EM | Admit: 2014-03-23 | Discharge: 2014-03-24 | Disposition: A | Discharge status: Home / Self Care | Payer: Medicare Other | Source: Home / Self Care | Attending: Emergency Medicine | Admitting: Emergency Medicine

## 2014-03-23 DIAGNOSIS — I10 Essential (primary) hypertension: Secondary | ICD-10-CM | POA: Insufficient documentation

## 2014-03-23 DIAGNOSIS — F1010 Alcohol abuse, uncomplicated: Secondary | ICD-10-CM | POA: Insufficient documentation

## 2014-03-23 DIAGNOSIS — F1021 Alcohol dependence, in remission: Secondary | ICD-10-CM

## 2014-03-23 DIAGNOSIS — F1020 Alcohol dependence, uncomplicated: Secondary | ICD-10-CM | POA: Diagnosis not present

## 2014-03-23 DIAGNOSIS — Z87440 Personal history of urinary (tract) infections: Secondary | ICD-10-CM | POA: Insufficient documentation

## 2014-03-23 LAB — COMPREHENSIVE METABOLIC PANEL
ALT: 17 U/L (ref 0–53)
ANION GAP: 11 (ref 5–15)
AST: 19 U/L (ref 0–37)
Albumin: 2.9 g/dL — ABNORMAL LOW (ref 3.5–5.2)
Alkaline Phosphatase: 127 U/L — ABNORMAL HIGH (ref 39–117)
BUN: 14 mg/dL (ref 6–23)
CHLORIDE: 103 meq/L (ref 96–112)
CO2: 28 meq/L (ref 19–32)
CREATININE: 0.78 mg/dL (ref 0.50–1.35)
Calcium: 7.8 mg/dL — ABNORMAL LOW (ref 8.4–10.5)
GFR calc Af Amer: >90 mL/min (ref >90)
GLUCOSE: 85 mg/dL (ref 70–99)
POTASSIUM: 4.3 meq/L (ref 3.7–5.3)
Sodium: 142 mEq/L (ref 137–147)
Total Bilirubin: 0.3 mg/dL (ref 0.3–1.2)
Total Protein: 6.2 g/dL (ref 6.0–8.3)

## 2014-03-23 LAB — CBC WITH DIFFERENTIAL/PLATELET
Basophils Absolute: 0 10*3/uL (ref 0.0–0.1)
Basophils Relative: 0 % (ref 0–1)
Eosinophils Absolute: 0 10*3/uL (ref 0.0–0.7)
Eosinophils Relative: 1 % (ref 0–5)
HEMATOCRIT: 39.4 % (ref 39.0–52.0)
HEMOGLOBIN: 13.1 g/dL (ref 13.0–17.0)
LYMPHS ABS: 3.1 10*3/uL (ref 0.7–4.0)
LYMPHS PCT: 49 % — AB (ref 12–46)
MCH: 32.3 pg (ref 26.0–34.0)
MCHC: 33.2 g/dL (ref 30.0–36.0)
MCV: 97.3 fL (ref 78.0–100.0)
MONO ABS: 0.5 10*3/uL (ref 0.1–1.0)
MONOS PCT: 8 % (ref 3–12)
NEUTROS ABS: 2.6 10*3/uL (ref 1.7–7.7)
NEUTROS PCT: 42 % — AB (ref 43–77)
Platelets: 249 10*3/uL (ref 150–400)
RBC: 4.05 MIL/uL — AB (ref 4.22–5.81)
RDW: 16.5 % — ABNORMAL HIGH (ref 11.5–15.5)
WBC: 6.3 10*3/uL (ref 4.0–10.5)

## 2014-03-23 LAB — ETHANOL: ALCOHOL ETHYL (B): 309 mg/dL — AB (ref 0–11)

## 2014-03-23 MED ORDER — LORAZEPAM 1 MG PO TABS: 0.0000 mg | ORAL_TABLET | Freq: Two times a day (BID) | ORAL | Status: DC

## 2014-03-23 MED ORDER — LORAZEPAM 1 MG PO TABS: 0.0000 mg | ORAL_TABLET | Freq: Four times a day (QID) | ORAL | Status: DC

## 2014-03-23 NOTE — ED Notes (Signed)
Pt states he drank a fifth of wine. Denies nausea, tactile disturbances, visual disturbances, tremor or sweats. Slurred speech present. Pt oriented X 4.

## 2014-03-23 NOTE — ED Provider Notes (Signed)
[name]: [id number]     Arrival date & time 03/23/14  1739 History   First MD Initiated Contact with Patient 03/23/14 2002     Chief Complaint  Patient presents with  . Alcohol Problem     (Consider location/radiation/quality/duration/timing/severity/associated sxs/prior Treatment) HPI Comments: Patient has been an alcoholic for the past 40 years present today wanting detox stating, that if I don't stop drinking.  I of the eye.  He lives in a room.  He was he is not suicidal or homicidal.  He, states he takes no medication on a regular basis.  He has no primary care doctor and he drank a fifth of alcohol today. Patient states he had one episode of DTs about 10 years ago.  His last detox.  [facility] was approximately one year ago, where he stayed sober for approximately 3 months.  He had no problem with DTs at that time.  Patient is a 68 y.o. male presenting with alcohol problem. The history is provided by the patient.  Alcohol Problem This is a chronic problem. The problem occurs constantly. The problem has been unchanged. Pertinent negatives include no abdominal pain, chest pain, chills, fever or headaches. Nothing aggravates the symptoms. He has tried nothing for the symptoms. The treatment provided no relief.    Past Medical History  Diagnosis Date  . Hypertension   . Urinary tract infection   . ETOH abuse    History reviewed. No pertinent past surgical history. History reviewed. No pertinent family history. History  Substance Use Topics  . Smoking status: Never Smoker   . Smokeless tobacco: Current User    Types: Chew  . Alcohol Use: Yes     Comment: Drinks 1/5 and 2-24oz beers, daily     Review of Systems  Constitutional: Negative for fever and chills.  Respiratory: Negative for shortness of breath.   Cardiovascular: Negative for chest pain.  Gastrointestinal: Negative for abdominal pain.  Neurological: Negative for headaches.  Psychiatric/Behavioral: Negative for suicidal  ideas.  All other systems reviewed and are negative.     Allergies  Review of patient's allergies indicates no known allergies.  Home Medications   Prior to Admission medications   Not on File   BP 107/74  Pulse 61  Temp(Src) 98.2 F (36.8 C) (Oral)  Resp 18  SpO2 96% Physical Exam  Nursing note and vitals reviewed. Constitutional: He is oriented to person, place, and time. He appears well-developed. No distress.  HENT:  Mouth/Throat: Oropharynx is clear and moist.  Eyes: Pupils are equal, round, and reactive to light.  Neck: Normal range of motion.  Cardiovascular: Normal rate.   Abdominal: There is no tenderness.  Neurological: He is alert and oriented to person, place, and time.  Skin: Skin is warm and dry.  Psychiatric: He has a normal mood and affect. His speech is normal and behavior is normal. Judgment normal. Cognition and memory are normal.    ED Course  Procedures (including critical care time) Labs Review Labs Reviewed  CBC WITH DIFFERENTIAL - Abnormal; Notable for the following:    RBC 4.05 (*)    RDW 16.5 (*)    Neutrophils Relative % 42 (*)    Lymphocytes Relative 49 (*)    All other components within normal limits  COMPREHENSIVE METABOLIC PANEL - Abnormal; Notable for the following:    Calcium 7.8 (*)    Albumin 2.9 (*)    Alkaline Phosphatase 127 (*)    All other components within normal limits  ETHANOL - Abnormal; Notable for the following:    Alcohol, Ethyl (B) 309 (*)    All other components within normal limits  URINE RAPID DRUG SCREEN (HOSP PERFORMED)    Imaging Review No results found.   EKG Interpretation None      MDM   Final diagnoses:  None        Arman Filter, NP 03/23/14 2234

## 2014-03-23 NOTE — ED Notes (Addendum)
He states "i want help to stop drinking." he states hes been 'drinking all day today." he denies any other substance use. He is alert and oriented. Denies pain or any physical complaints. Denies si/hi

## 2014-03-23 NOTE — ED Notes (Signed)
Pt states "no DTs"; denies diaphoresis, hallucinations, tremors. States he drink a fifth before coming in.

## 2014-03-24 ENCOUNTER — Encounter (HOSPITAL_COMMUNITY): Payer: Self-pay | Admitting: *Deleted

## 2014-03-24 ENCOUNTER — Inpatient Hospital Stay (HOSPITAL_COMMUNITY)
Admission: AD | Admit: 2014-03-24 | Discharge: 2014-04-05 | DRG: 897 | Disposition: A | Discharge status: Home / Self Care | Payer: Medicare Other | Source: Intra-hospital | Attending: Psychiatry | Admitting: Psychiatry

## 2014-03-24 DIAGNOSIS — F411 Generalized anxiety disorder: Secondary | ICD-10-CM | POA: Diagnosis present

## 2014-03-24 DIAGNOSIS — F10239 Alcohol dependence with withdrawal, unspecified: Secondary | ICD-10-CM

## 2014-03-24 DIAGNOSIS — Z5987 Material hardship due to limited financial resources, not elsewhere classified: Secondary | ICD-10-CM

## 2014-03-24 DIAGNOSIS — Z598 Other problems related to housing and economic circumstances: Secondary | ICD-10-CM | POA: Diagnosis not present

## 2014-03-24 DIAGNOSIS — F1010 Alcohol abuse, uncomplicated: Secondary | ICD-10-CM | POA: Diagnosis present

## 2014-03-24 DIAGNOSIS — G4700 Insomnia, unspecified: Secondary | ICD-10-CM | POA: Diagnosis present

## 2014-03-24 DIAGNOSIS — F332 Major depressive disorder, recurrent severe without psychotic features: Secondary | ICD-10-CM | POA: Diagnosis present

## 2014-03-24 DIAGNOSIS — Z5989 Other problems related to housing and economic circumstances: Secondary | ICD-10-CM | POA: Diagnosis not present

## 2014-03-24 DIAGNOSIS — I10 Essential (primary) hypertension: Secondary | ICD-10-CM | POA: Diagnosis present

## 2014-03-24 DIAGNOSIS — F1020 Alcohol dependence, uncomplicated: Secondary | ICD-10-CM | POA: Diagnosis present

## 2014-03-24 DIAGNOSIS — F10230 Alcohol dependence with withdrawal, uncomplicated: Secondary | ICD-10-CM

## 2014-03-24 LAB — ETHANOL: Alcohol, Ethyl (B): 202 mg/dL — ABNORMAL HIGH (ref 0–11)

## 2014-03-24 MED ORDER — CHLORDIAZEPOXIDE HCL 25 MG PO CAPS
25.0000 mg | ORAL_CAPSULE | Freq: Every day | ORAL | Status: AC
  Administered 2014-03-28: 25 mg via ORAL
  Filled 2014-03-24: qty 1

## 2014-03-24 MED ORDER — HYDROXYZINE HCL 25 MG PO TABS
25.0000 mg | ORAL_TABLET | Freq: Four times a day (QID) | ORAL | Status: AC | PRN
  Filled 2014-03-24: qty 1

## 2014-03-24 MED ORDER — LOPERAMIDE HCL 2 MG PO CAPS: 2.0000 mg | ORAL_CAPSULE | ORAL | Status: AC | PRN

## 2014-03-24 MED ORDER — CHLORDIAZEPOXIDE HCL 25 MG PO CAPS: 25.0000 mg | ORAL_CAPSULE | Freq: Four times a day (QID) | ORAL | Status: DC | PRN

## 2014-03-24 MED ORDER — MAGNESIUM HYDROXIDE 400 MG/5ML PO SUSP: 30.0000 mL | Freq: Every day | ORAL | Status: DC | PRN

## 2014-03-24 MED ORDER — ONDANSETRON 4 MG PO TBDP
4.0000 mg | ORAL_TABLET | Freq: Four times a day (QID) | ORAL | Status: AC | PRN
  Administered 2014-03-25: 4 mg via ORAL
  Filled 2014-03-24: qty 1

## 2014-03-24 MED ORDER — ACETAMINOPHEN 325 MG PO TABS: 650.0000 mg | ORAL_TABLET | Freq: Four times a day (QID) | ORAL | Status: DC | PRN

## 2014-03-24 MED ORDER — VITAMIN B-1 100 MG PO TABS: 100.0000 mg | ORAL_TABLET | Freq: Every day | ORAL | Status: DC

## 2014-03-24 MED ORDER — CHLORDIAZEPOXIDE HCL 25 MG PO CAPS
25.0000 mg | ORAL_CAPSULE | Freq: Three times a day (TID) | ORAL | Status: AC
  Administered 2014-03-25 (×2): 25 mg via ORAL
  Filled 2014-03-24 (×4): qty 1

## 2014-03-24 MED ORDER — ALUM & MAG HYDROXIDE-SIMETH 200-200-20 MG/5ML PO SUSP: 30.0000 mL | ORAL | Status: DC | PRN

## 2014-03-24 MED ORDER — ADULT MULTIVITAMIN W/MINERALS CH
1.0000 | ORAL_TABLET | Freq: Every day | ORAL | Status: DC
  Administered 2014-03-24 – 2014-04-05 (×12): 1 via ORAL
  Filled 2014-03-24 (×16): qty 1

## 2014-03-24 MED ORDER — ADULT MULTIVITAMIN W/MINERALS CH
1.0000 | ORAL_TABLET | Freq: Every day | ORAL | Status: DC
  Administered 2014-03-24: 1 via ORAL
  Filled 2014-03-24: qty 1

## 2014-03-24 MED ORDER — THIAMINE HCL 100 MG/ML IJ SOLN
100.0000 mg | Freq: Once | INTRAMUSCULAR | Status: AC
  Administered 2014-03-24: 10:00:00 via INTRAMUSCULAR
  Filled 2014-03-24: qty 2

## 2014-03-24 MED ORDER — TRAZODONE HCL 50 MG PO TABS
50.0000 mg | ORAL_TABLET | Freq: Every evening | ORAL | Status: DC | PRN
  Administered 2014-03-24: 50 mg via ORAL
  Filled 2014-03-24 (×3): qty 1
  Filled 2014-03-24: qty 8

## 2014-03-24 MED ORDER — LOPERAMIDE HCL 2 MG PO CAPS: 2.0000 mg | ORAL_CAPSULE | ORAL | Status: DC | PRN

## 2014-03-24 MED ORDER — CHLORDIAZEPOXIDE HCL 25 MG PO CAPS
25.0000 mg | ORAL_CAPSULE | Freq: Four times a day (QID) | ORAL | Status: AC | PRN
  Administered 2014-03-25: 25 mg via ORAL
  Filled 2014-03-24: qty 1

## 2014-03-24 MED ORDER — HYDROXYZINE HCL 25 MG PO TABS: 25.0000 mg | ORAL_TABLET | Freq: Four times a day (QID) | ORAL | Status: DC | PRN

## 2014-03-24 MED ORDER — ACETAMINOPHEN 325 MG PO TABS
650.0000 mg | ORAL_TABLET | Freq: Four times a day (QID) | ORAL | Status: DC | PRN
  Administered 2014-03-24 – 2014-03-31 (×5): 650 mg via ORAL
  Filled 2014-03-24 (×5): qty 2

## 2014-03-24 MED ORDER — CHLORDIAZEPOXIDE HCL 25 MG PO CAPS: 25.0000 mg | ORAL_CAPSULE | Freq: Every day | ORAL | Status: DC

## 2014-03-24 MED ORDER — CHLORDIAZEPOXIDE HCL 25 MG PO CAPS
25.0000 mg | ORAL_CAPSULE | ORAL | Status: AC
  Administered 2014-03-26 – 2014-03-27 (×2): 25 mg via ORAL
  Filled 2014-03-24 (×2): qty 1

## 2014-03-24 MED ORDER — ONDANSETRON 4 MG PO TBDP: 4.0000 mg | ORAL_TABLET | Freq: Four times a day (QID) | ORAL | Status: DC | PRN

## 2014-03-24 MED ORDER — THIAMINE HCL 100 MG/ML IJ SOLN
100.0000 mg | Freq: Once | INTRAMUSCULAR | Status: AC
  Administered 2014-03-24: 100 mg via INTRAMUSCULAR

## 2014-03-24 MED ORDER — CHLORDIAZEPOXIDE HCL 25 MG PO CAPS: 25.0000 mg | ORAL_CAPSULE | Freq: Three times a day (TID) | ORAL | Status: DC

## 2014-03-24 MED ORDER — VITAMIN B-1 100 MG PO TABS
100.0000 mg | ORAL_TABLET | Freq: Every day | ORAL | Status: DC
  Administered 2014-03-25 – 2014-04-05 (×11): 100 mg via ORAL
  Filled 2014-03-24 (×15): qty 1

## 2014-03-24 MED ORDER — LABETALOL HCL 100 MG PO TABS
100.0000 mg | ORAL_TABLET | Freq: Two times a day (BID) | ORAL | Status: DC
  Administered 2014-03-24 – 2014-04-05 (×19): 100 mg via ORAL
  Filled 2014-03-24 (×3): qty 1
  Filled 2014-03-24: qty 8
  Filled 2014-03-24 (×3): qty 1
  Filled 2014-03-24: qty 8
  Filled 2014-03-24 (×24): qty 1

## 2014-03-24 MED ORDER — CLONIDINE HCL 0.1 MG PO TABS
0.1000 mg | ORAL_TABLET | Freq: Once | ORAL | Status: AC
  Administered 2014-03-24: 0.1 mg via ORAL
  Filled 2014-03-24 (×2): qty 1

## 2014-03-24 MED ORDER — CHLORDIAZEPOXIDE HCL 25 MG PO CAPS
25.0000 mg | ORAL_CAPSULE | Freq: Four times a day (QID) | ORAL | Status: DC
  Administered 2014-03-24: 25 mg via ORAL
  Filled 2014-03-24: qty 1

## 2014-03-24 MED ORDER — CHLORDIAZEPOXIDE HCL 25 MG PO CAPS
25.0000 mg | ORAL_CAPSULE | Freq: Four times a day (QID) | ORAL | Status: AC
  Administered 2014-03-24 – 2014-03-25 (×4): 25 mg via ORAL
  Filled 2014-03-24 (×3): qty 1

## 2014-03-24 MED ORDER — CHLORDIAZEPOXIDE HCL 25 MG PO CAPS: 25.0000 mg | ORAL_CAPSULE | ORAL | Status: DC

## 2014-03-24 NOTE — Discharge Instructions (Signed)
Alcohol Use Disorder Alcohol use disorder is a mental disorder. It is not a one-time incident of heavy drinking. Alcohol use disorder is the excessive and uncontrollable use of alcohol over time that leads to problems with functioning in one or more areas of daily living. People with this disorder risk harming themselves and others when they drink to excess. Alcohol use disorder also can cause other mental disorders, such as mood and anxiety disorders, and serious physical problems. People with alcohol use disorder often misuse other drugs.  Alcohol use disorder is common and widespread. Some people with this disorder drink alcohol to cope with or escape from negative life events. Others drink to relieve chronic pain or symptoms of mental illness. People with a family history of alcohol use disorder are at higher risk of losing control and using alcohol to excess.  SYMPTOMS  Signs and symptoms of alcohol use disorder may include the following:   Consumption ofalcohol inlarger amounts or over a longer period of time than intended.  Multiple unsuccessful attempts to cutdown or control alcohol use.   A great deal of time spent obtaining alcohol, using alcohol, or recovering from the effects of alcohol (hangover).  A strong desire or urge to use alcohol (cravings).   Continued use of alcohol despite problems at work, school, or home because of alcohol use.   Continued use of alcohol despite problems in relationships because of alcohol use.  Continued use of alcohol in situations when it is physically hazardous, such as driving a car.  Continued use of alcohol despite awareness of a physical or psychological problem that is likely related to alcohol use. Physical problems related to alcohol use can involve the brain, heart, liver, stomach, and intestines. Psychological problems related to alcohol use include intoxication, depression, anxiety, psychosis, delirium, and dementia.   The need for  increased amounts of alcohol to achieve the same desired effect, or a decreased effect from the consumption of the same amount of alcohol (tolerance).  Withdrawal symptoms upon reducing or stopping alcohol use, or alcohol use to reduce or avoid withdrawal symptoms. Withdrawal symptoms include:  Racing heart.  Hand tremor.  Difficulty sleeping.  Nausea.  Vomiting.  Hallucinations.  Restlessness.  Seizures. DIAGNOSIS Alcohol use disorder is diagnosed through an assessment by your health care provider. Your health care provider may start by asking three or four questions to screen for excessive or problematic alcohol use. To confirm a diagnosis of alcohol use disorder, at least two symptoms must be present within a 12-month period. The severity of alcohol use disorder depends on the number of symptoms:  Mild--two or three.  Moderate--four or five.  Severe--six or more. Your health care provider may perform a physical exam or use results from lab tests to see if you have physical problems resulting from alcohol use. Your health care provider may refer you to a mental health professional for evaluation. TREATMENT  Some people with alcohol use disorder are able to reduce their alcohol use to low-risk levels. Some people with alcohol use disorder need to quit drinking alcohol. When necessary, mental health professionals with specialized training in substance use treatment can help. Your health care provider can help you decide how severe your alcohol use disorder is and what type of treatment you need. The following forms of treatment are available:   Detoxification. Detoxification involves the use of prescription medicines to prevent alcohol withdrawal symptoms in the first week after quitting. This is important for people with a history of symptoms   of withdrawal and for heavy drinkers who are likely to have withdrawal symptoms. Alcohol withdrawal can be dangerous and, in severe cases, cause  death. Detoxification is usually provided in a hospital or in-patient substance use treatment facility.  Counseling or talk therapy. Talk therapy is provided by substance use treatment counselors. It addresses the reasons people use alcohol and ways to keep them from drinking again. The goals of talk therapy are to help people with alcohol use disorder find healthy activities and ways to cope with life stress, to identify and avoid triggers for alcohol use, and to handle cravings, which can cause relapse.  Medicines.Different medicines can help treat alcohol use disorder through the following actions:  Decrease alcohol cravings.  Decrease the positive reward response felt from alcohol use.  Produce an uncomfortable physical reaction when alcohol is used (aversion therapy).  Support groups. Support groups are run by people who have quit drinking. They provide emotional support, advice, and guidance. These forms of treatment are often combined. Some people with alcohol use disorder benefit from intensive combination treatment provided by specialized substance use treatment centers. Both inpatient and outpatient treatment programs are available. Document Released: 09/26/2004 Document Revised: 01/03/2014 Document Reviewed: 11/26/2012 ExitCare Patient Information 2015 [employer]. This information is not intended to replace advice given to you by your health care provider. Make sure you discuss any questions you have with your health care provider.  

## 2014-03-24 NOTE — Progress Notes (Signed)
Pt in bed most of shift. Unsteady gait. Pt b/p 102/70. Pt cooperative with staff instructions. Pt given nutrition and hydration. Pt monitored for safety. Denies self harm. No concerns to staff expressed by pt.

## 2014-03-24 NOTE — Care Management Utilization Note (Signed)
PER STATE REGULATIONS 482.30  THIS CHART WAS REVIEWED FOR MEDICAL NECESSITY WITH RESPECT TO THE PATIENT'S ADMISSION/DURATION OF STAY 03/24/14.  NEXT REVIEW DATE: 03/27/14  Loura HaltBARBARA Ariston Grandison, RN, BSN CASE MANAGER

## 2014-03-24 NOTE — ED Notes (Addendum)
Telepsych called to schedule appointment with pt in 10 minutes. Computer set up in pt's room and ready for appointment.

## 2014-03-24 NOTE — BH Assessment (Addendum)
Assessment Note  Ralph Jackson is an 68 y.o. male who presents seeking detox.  He reports drinking a fifth of wine daily for the last 40 years.  He states he's had a period of sobriety that lasted around 7 years when he was in his twenties, but that his most recent period of sobriety was some time last month and only lasted about 30 days.  He states he is seeking detox because he knows that he's going to die if he doesn't.  He denies any thoughts of suicide or self harm or depression.  He also denies any thoughts of homicide, history of violence, access to weapons, and states he does not have a court date or any criminal charges pending.  He also denies AVH or delusional thoughts.  He is calm and cooperative and appropriate for inpatient detox.   [facility] by Jan[facility] Augustori Burkett, [facility], to the service of Dr Geoffery LyonsIrving Lugo room 305-1.  [facility] RN, [facility] notified of the disposition. Pt will be transported by [facility].  Axis I: Substance Abuse Axis II: Deferred Axis III:  Past Medical History  Diagnosis Date  . Hypertension   . Urinary tract infection   . ETOH abuse    Axis IV: problems with access to health care services and problems with primary support group Axis V: 41-50 serious symptoms  Past Medical History:  Past Medical History  Diagnosis Date  . Hypertension   . Urinary tract infection   . ETOH abuse     History reviewed. No pertinent past surgical history.  Family History: History reviewed. No pertinent family history.  Social History:  reports that he has never smoked. His smokeless tobacco use includes Chew. He reports that he drinks alcohol. He reports that he does not use illicit drugs.  Additional Social History:  Substance #1 Name of Substance 1: Alcohol  1 - Age of First Use: 20 YOM  1 - Amount (size/oz): a fifth of wine 1 - Frequency: daily 1 - Duration: over 40 years with a few periods of sobriety 1 - Last Use / Amount: 03/24/14 a fifth of wine  CIWA:  CIWA-Ar BP: 137/88 mmHg Pulse Rate: 73 Nausea and Vomiting: no nausea and no vomiting Tactile Disturbances: none Tremor: no tremor Auditory Disturbances: not present Paroxysmal Sweats: no sweat visible Visual Disturbances: not present Anxiety: no anxiety, at ease Headache, Fullness in Head: none present Agitation: normal activity Orientation and Clouding of Sensorium: oriented and can do serial additions CIWA-Ar Total: 0 COWS:    Allergies: No Known Allergies  Home Medications:  (Not in a hospital admission)  OB/GYN Status:  No LMP for male patient.  General Assessment Data [facility] Is this a Tele or Face-to-Face Assessment?: Face-to-Face Is this an Initial Assessment or a Re-assessment for this encounter?: Initial Assessment Living Arrangements: Alone Can pt return to current living arrangement?: Yes Admission Status: Voluntary Is patient capable of signing voluntary admission?: Yes Transfer from: Acute Hospital Referral Source: Self/Family/Friend     [facility] Living Arrangements: Alone Name of Psychiatrist: NA Name of Therapist: NA  Education Status Is patient currently in school?: No Highest grade of school patient has completed: 12  Risk to self Suicidal Ideation: No Suicidal Intent: No Is patient at risk for suicide?: No Suicidal Plan?: No Access to Means: No Previous Attempts/Gestures: No How many times?: 0 Triggers for Past Attempts: None known Intentional Self Injurious Behavior: None Family Suicide History: No  Recent stressful life event(s):  (none identified) Persecutory voices/beliefs?: No Depression: No Substance abuse history and/or treatment for substance abuse?: No Suicide prevention information given to non-admitted patients: Not applicable  Risk to Others Homicidal Ideation: No Thoughts of Harm to Others: No Current Homicidal Intent: No Current Homicidal Plan: No Access to Homicidal Means: No History  of harm to others?: No Assessment of Violence: None Noted Does patient have access to weapons?: No Criminal Charges Pending?: No Does patient have a court date: No  Psychosis Hallucinations: None noted Delusions: None noted  Mental Status Report Appear/Hygiene: Disheveled;In scrubs Eye Contact: Good Motor Activity: Freedom of movement Speech: Logical/coherent Level of Consciousness: Alert Mood: Euthymic Affect: Appropriate to circumstance Anxiety Level: None Thought Processes: Coherent;Relevant Judgement: Unimpaired Orientation: Person;Place;Situation Obsessive Compulsive Thoughts/Behaviors: None  Cognitive Functioning Concentration: Decreased Memory: Recent Intact;Remote Intact IQ: Average Insight: Good Impulse Control: Good Appetite: Good Weight Loss: 0 Weight Gain: 0 Sleep: No Change Total Hours of Sleep: 6 Vegetative Symptoms: None  ADLScreening [facility] Assessment Services) Patient's cognitive ability adequate to safely complete daily activities?: Yes Patient able to express need for assistance with ADLs?: Yes Independently performs ADLs?: Yes (appropriate for developmental age)  Prior Inpatient Therapy Prior Inpatient Therapy: Yes Prior Therapy Dates: 2011, various other dates  Prior Therapy Facilty/Provider(s): [facility]; various other facilities  Reason for Treatment: Detox/Rehab   Prior Outpatient Therapy Prior Outpatient Therapy: No Prior Therapy Dates: None  Prior Therapy Facilty/Provider(s): None  Reason for Treatment: None   ADL Screening (condition at time of admission) Patient's cognitive ability adequate to safely complete daily activities?: Yes Patient able to express need for assistance with ADLs?: Yes Independently performs ADLs?: Yes (appropriate for developmental age)       Abuse/Neglect Assessment (Assessment to be complete while patient is alone) Physical Abuse: Denies Verbal Abuse: Denies Sexual Abuse: Denies       Nutrition Screen-  [facility] Adult/[facility]/AP Patient's home diet: Regular  Additional Information 1:1 In Past 12 Months?: No CIRT Risk: No Elopement Risk: No Does patient have medical clearance?: Yes     Disposition:  Disposition Initial Assessment Completed for this Encounter: Yes Disposition of Patient: Inpatient treatment program;Referred to Type of inpatient treatment program: Adult Patient referred to: Other (Comment)  On Site Evaluation by:   Reviewed with Physician:    Steward Ros 03/24/2014 8:14 AM

## 2014-03-24 NOTE — ED Provider Notes (Signed)
Medical screening examination/treatment/procedure(s) were performed by non-physician practitioner and as supervising physician I was immediately available for consultation/collaboration.   EKG Interpretation None       Derwood KaplanAnkit Ranette Luckadoo, MD 03/24/14 0201

## 2014-03-24 NOTE — ED Provider Notes (Signed)
6:04 AM Pt signed out by Sharen HonesGail Schultz, NP at shift change.  Pt is in ED requesting help with his alcohol abuse.  Denies any other substance abuse, denies SI or HI.  Pt is on CIWA protocol.  Pt is sleeping.  Telepsych consult pending.     Ralph Finnerrin O'Malley, PA-C 03/24/14 1610

## 2014-03-24 NOTE — ED Provider Notes (Signed)
10:43 AM Pt [facility]  1. Alcohol abuse      Shanna CiscoMegan E Docherty, MD 03/24/14 [phone]

## 2014-03-24 NOTE — Progress Notes (Signed)
NUTRITION ASSESSMENT  Pt identified as at risk on the Malnutrition Screen Tool  INTERVENTION: 1. Educated patient on the importance of nutrition and encouraged intake of food and beverages. 2. Anti-emetics per MD  NUTRITION DIAGNOSIS: Underweight related to alcohol abuse as evidenced by BMI of 16 kg/(m^2).   Goal: Pt to meet >/= 90% of their estimated nutrition needs.  Monitor:  PO intake  Assessment:  Admitted with alcohol abuse, hx of kidney stones, HTN and ETOH abuse times 40 years. Pt stated, "I have been drinking for over 40 years 1/5 of wine everyday".   - Pt visibly cachetic - States he eats 3 meals/day at [employer] - Weight stable compared to last month - Didn't want anything for lunch today and not interested in nutritional supplements - C/o nausea, notified RN   68 y.o. male  Height: Ht Readings from Last 1 Encounters:  03/24/14 '5\' 4"'  (1.626 m)    Weight: Wt Readings from Last 1 Encounters:  03/24/14 95 lb (43.092 kg)    Weight Hx: Wt Readings from Last 10 Encounters:  03/24/14 95 lb (43.092 kg)  01/31/14 94 lb 1 oz (42.666 kg)    BMI:  Body mass index is 16.3 kg/(m^2). Pt meets criteria for underweight based on current BMI.  Estimated Nutritional Needs: Kcal: 30-35 kcal/kg Protein: > 1.2-1.5 gram protein/kg Fluid: 1 ml/kcal  Diet Order: General Pt is also offered choice of unit snacks mid-morning and mid-afternoon.  Pt is eating as desired.   Lab results and medications reviewed. Alk phos elevated. Getting thiamine and multivitamin.   Carlis Stable MS, [place], LDN [phone] Pager [phone] Weekend/After Hours Pager

## 2014-03-24 NOTE — Progress Notes (Addendum)
Phoned MD as pt does not have admitting orders. Also pt BP is elevated but denies any pain. His last drink was yesterday and pt does have some tremors. Spoke with Social worker, Research scientist (physical sciences)Heather, concerning pts needs upon discharge. 2pm-Pt denies any chest pain and appears to be resting comfortably. Pt is due for BP meds at 5pm. Presently he is on the librium protocol.

## 2014-03-24 NOTE — Progress Notes (Signed)
Patient ID: Ralph Jackson, male   DOB: 1946-09-01, 68 y.o.   MRN: [id number] [facility] abuse. He has no NKDA and medical hx includes kidney stones, HTN and ETOH abuse times 40 years. Pt is a 68 year old male who states," I have been drinking for over 40 years .1/5 of wine everyday." Pt also states that he lives in a boarding room and the only person he is friendly with is the owner of the house, Ralph Jackson. Pt presents malnourished and told the writer that he generally only eats one meal a day that he gets from [place] [facility] [place] [employer]. Pt stated,"I do not take any medications, do not do drugs and do not smoke.He is estranged from his two children that both reside in [place].Pt admits he was married times 30 years. Pt denies SI and HI and contracts for safety. He ws brought onto the unit and given a pitcher of gatorade and refused lunch. Pt did state he would like to speak to a Child psychotherapistsocial worker to get connected to meals on wheels and other services he may qualify for.

## 2014-03-24 NOTE — ED Notes (Signed)
Pt able to eat some of his breakfast. Pt states he is not too hungry at this time. Pt denies any SI, pt denies hallucinations and headache. Pt content to rest for now.

## 2014-03-24 NOTE — Progress Notes (Signed)
CSW spoke with pt in regards to visit to ED. Pt reports that he has been drinking for 40 years. Pt reports that he has been to several treatment facilities but only [facility]  [facility]. Pt reports that he started drinking immediately. CSW asked pt why is he seeking treatment/detox currently. Pt reports that he recognizes that he has a problem and does not want to get cirrhosis of the liver. Pt has been assessed awaiting disposition.   [address] Lanie Schelling, [place]CSWA [phone]

## 2014-03-24 NOTE — ED Notes (Signed)
[facility] present and [facility]. Pt is stable and cooperative for transport.

## 2014-03-25 DIAGNOSIS — F1010 Alcohol abuse, uncomplicated: Secondary | ICD-10-CM

## 2014-03-25 DIAGNOSIS — F1994 Other psychoactive substance use, unspecified with psychoactive substance-induced mood disorder: Secondary | ICD-10-CM

## 2014-03-25 NOTE — [facility] Group Notes (Signed)
[facility] LCSW Group Therapy  03/25/2014 2:17 PM  Type of Therapy:  Group Therapy  Participation Level:  Did Not Attend-pt in room sleeping.   Smart, Viha Kriegel LCSWA  03/25/2014, 2:17 PM

## 2014-03-25 NOTE — Progress Notes (Signed)
Pt resting in bed with eyes closed. No distress noted. Will continue to monitor closely.  

## 2014-03-25 NOTE — Progress Notes (Signed)
Patient did not attend AA meeting. He stayed in bed all evening. He did not even get up for snack.  Rosilyn MingsMingia, Jiovani Mccammon A 11:58 PM

## 2014-03-25 NOTE — Clinical Social Work Note (Signed)
CSW attempted to meet with pt twice today (once this morning and again this afternoon) to complete PSA. Pt sleeping in bed and unable to complete assessment today. Weekend CSW to try back tomorrow.  The Sherwin-WilliamsHeather Smart, LCSWA 03/25/2014 2:18 PM

## 2014-03-25 NOTE — [facility] Group Notes (Signed)
[facility] LCSW Aftercare Discharge Planning Group Note  03/25/2014  10:08 AM  Participation Quality: Did Not Attend   Samuella BruinKristin Favio Moder, MSW, [facility] Clinical Social Worker [facility] [phone]

## 2014-03-25 NOTE — [facility] Group Notes (Signed)
[facility] LCSW Group Therapy 03/14/2014 1:15 PM   Type of Therapy: Group Therapy  Participation Level: Did Not Attend    Rayaan Lorah, MSW, LCSWA Clinical Social Worker [facility] [phone]    

## 2014-03-25 NOTE — [facility] Suicide Risk Assessment (Signed)
[facility] INPATIENT: Family/Significant Other Suicide Prevention Education  Suicide Prevention Education:  Education Completed; No one has been identified by the patient as the family member/significant other with whom the patient will be residing, and identified as the person(s) who will aid the patient in the event of a mental health crisis (suicidal ideations/suicide attempt).   Pt did not c/o SI at admission, nor have they endorsed SI during their stay here. SPE not required. SPI pamphlet provided to pt and he was encouraged to share information with support network, ask questions, and talk about issues concerning SPE.  The Sherwin-WilliamsHeather Smart, LCSWA 03/25/2014 8:39 AM

## 2014-03-25 NOTE — ED Provider Notes (Signed)
Medical screening examination/treatment/procedure(s) were performed by non-physician practitioner and as supervising physician I was immediately available for consultation/collaboration.     Elzia Indica Marcott, MD 03/25/14 1527 

## 2014-03-25 NOTE — [facility] Suicide Risk Assessment (Signed)
   Nursing information obtained from:  Patient Demographic factors:  Male;Age 68 or older;Caucasian;Low socioeconomic status Current Mental Status:    Loss Factors:  Loss of significant relationship;Decline in physical health Historical Factors:    Risk Reduction Factors:  Religious beliefs about death Total Time spent with patient: 45 minutes  CLINICAL FACTORS:   Alcohol/Substance Abuse/Dependencies  Psychiatric Specialty Exam: Physical Exam  ROS  Blood pressure 91/69, pulse 83, temperature 97.8 F (36.6 C), temperature source Oral, resp. rate 16, height 5\' 4"  (1.626 m), weight 95 lb (43.092 kg).Body mass index is 16.3 kg/(m^2).  General Appearance: Disheveled  Eye Contact::  Minimal  Speech:  Slow  Volume:  Decreased  Mood:  Anxious and Irritable  Affect:  Constricted, Depressed and Inappropriate  Thought Process:  Loose  Orientation:  Full (Time, Place, and Person)  Thought Content:  Rumination  Suicidal Thoughts:  No  Homicidal Thoughts:  No  Memory:  Immediate;   Fair Recent;   Poor Remote;   Poor  Judgement:  Impaired  Insight:  Fair  Psychomotor Activity:  Restlessness and Tremor  Concentration:  Fair  Recall:  [employer]  Fund of Knowledge:Fair  Language: Fair  Akathisia:  No  Handed:  Right  AIMS (if indicated):     Assets:  Communication Skills Desire for Improvement  Sleep:  Number of Hours: 5.75   Musculoskeletal: Strength & Muscle Tone: decreased Gait & Station: unsteady Patient leans: Front  COGNITIVE FEATURES THAT CONTRIBUTE TO RISK:  Closed-mindedness Polarized thinking    SUICIDE RISK:   Minimal: No identifiable suicidal ideation.  Patients presenting with no risk factors but with morbid ruminations; may be classified as minimal risk based on the severity of the depressive symptoms  PLAN OF CARE:  I certify that inpatient services furnished can reasonably be expected to improve the patient's condition.  Lynleigh Kovack T. 03/25/2014, 4:14 PM

## 2014-03-25 NOTE — H&P (Signed)
Psychiatric Admission Assessment Adult  Patient Identification:  Ralph Jackson Date of Evaluation:  03/25/2014 Chief Complaint:  ETOH use DISORDER History of Present Illness::   Ralph Jackson is a 68 year old male who presented voluntarily to [facility] requesting detox from alcohol. Patient reported drinking a fifth of wine daily for the last 40 years. The patient has become worried about the impact alcohol may be having on his physical health. Patient states today during his psychiatric admission assessment "I'm here for alcohol abuse. I drink a whole bunch every day. It's been going on a long time. I guess it's a habit. A friend got concerned about me. I almost passed out. I think I will die if I keep this up. I want to go to a rehab when I leave here." Patient was cooperative with his admission assessment. The patient was noted to have very poor hygiene and dentition. Patient endorses withdrawal symptoms of tremors, headaches, anxiety, and diarrhea. He has not been attending groups due to detox symptoms. Vitals have been stable.   Elements:  Location:  300 hall for alcohol detox . Quality:  Daily alcohol abuse . Severity:  Severe . Timing:  progressively getting worse . Duration:  "Have been drinking for forty years." . Context:  stressors, possible health problems, chronic alcohol abuse . Associated Signs/Synptoms: Depression Symptoms:  depressed mood, anhedonia, feelings of worthlessness/guilt, difficulty concentrating, hopelessness, anxiety, loss of energy/fatigue, disturbed sleep, (Hypo) Manic Symptoms:  Denies Anxiety Symptoms:  Excessive Worry, Psychotic Symptoms:  Denies PTSD Symptoms: NA Total Time spent with patient: 1 hour  Psychiatric Specialty Exam: Physical Exam  Constitutional:  Physical exam findings reviewed from the [facility] and I concur with no noted exceptions.     Review of Systems  Constitutional: Positive for malaise/fatigue and diaphoresis.  HENT:  Positive for tinnitus. Negative for congestion, ear discharge, ear pain, hearing loss and nosebleeds.   Eyes: Negative for blurred vision, double vision, photophobia, pain and discharge.  Respiratory: Negative for cough, hemoptysis, sputum production, shortness of breath and stridor.   Cardiovascular: Negative for chest pain, palpitations, orthopnea, claudication and leg swelling.  Gastrointestinal: Negative for heartburn, nausea, vomiting, abdominal pain, diarrhea, constipation and blood in stool.  Genitourinary: Negative for dysuria, urgency, frequency and hematuria.  Musculoskeletal: Negative for back pain, joint pain, myalgias and neck pain.  Neurological: Positive for tremors and headaches.  Endo/Heme/Allergies: Negative for environmental allergies. Does not bruise/bleed easily.  Psychiatric/Behavioral: Positive for depression and substance abuse. The patient is nervous/anxious.     Blood pressure 91/69, pulse 83, temperature 97.8 F (36.6 C), temperature source Oral, resp. rate 16, height '5\' 4"'  (1.626 m), weight 43.092 kg (95 lb).Body mass index is 16.3 kg/(m^2).  General Appearance: Disheveled  Eye Sport and exercise psychologist::  Fair  Speech:  Clear and Coherent and Slow  Volume:  Decreased  Mood:  Dysphoric  Affect:  Flat  Thought Process:  Coherent  Orientation:  Full (Time, Place, and Person)  Thought Content:  WDL  Suicidal Thoughts:  No  Homicidal Thoughts:  No  Memory:  Immediate;   Fair Recent;   Fair Remote;   Fair  Judgement:  Poor  Insight:  Shallow  Psychomotor Activity:  Decreased and Tremor  Concentration:  Fair  Recall:  [place]: Good  Akathisia:  No  Handed:  Right  AIMS (if indicated):     Assets:  Communication Skills Desire for Improvement Leisure Time Resilience  Sleep:  Number of Hours: 5.75  Musculoskeletal: Strength & Muscle Tone: decreased Gait & Station: unsteady Patient leans: N/A  Past Psychiatric History: Diagnosis:   Hospitalizations: [facility] 2011  Outpatient Care: Denies  Substance Abuse Care: [facility] in the past   Self-Mutilation:Denies  Suicidal Attempts: Denies  Violent Behaviors: Denies   Past Medical History:   Past Medical History  Diagnosis Date  . Hypertension   . Urinary tract infection   . ETOH abuse    None. Allergies:  No Known Allergies PTA Medications: No prescriptions prior to admission    Previous Psychotropic Medications:  Medication/Dose  Denies               Substance Abuse History in the last 12 months:  Yes.    Consequences of Substance Abuse: Patient has been abusing alcohol for many years.   Social History:  reports that he has never smoked. He uses smokeless tobacco. He reports that he drinks alcohol. He reports that he does not use illicit drugs. Additional Social History: History of alcohol / drug use?: Yes Negative Consequences of Use: Personal relationships Withdrawal Symptoms: Anorexia;Blackouts;Tremors Name of Substance 1: wine                   Current Place of Residence:   Place of Birth:   Family Members: Marital Status:  Divorced Children:  Sons:  Daughters: Relationships: Education:  HS Soil scientist Problems/Performance: Religious Beliefs/Practices: History of Abuse (Emotional/Phsycial/Sexual) Occupational Experiences; Military History:  None. Legal History: Hobbies/Interests:  Family History:  History reviewed. No pertinent family history.  Results for orders placed during the hospital encounter of 03/23/14 (from the past 72 hour(s))  CBC WITH DIFFERENTIAL     Status: Abnormal   Collection Time    03/23/14  8:45 PM      Result Value Ref Range   WBC 6.3  4.0 - 10.5 K/uL   RBC 4.05 (*) 4.22 - 5.81 MIL/uL   Hemoglobin 13.1  13.0 - 17.0 g/dL   HCT 39.4  39.0 - 52.0 %   MCV 97.3  78.0 - 100.0 fL   MCH 32.3  26.0 - 34.0 pg   MCHC 33.2  30.0 - 36.0 g/dL   RDW 16.5 (*) 11.5 - 15.5 %   Platelets 249  150 - 400  K/uL   Neutrophils Relative % 42 (*) 43 - 77 %   Neutro Abs 2.6  1.7 - 7.7 K/uL   Lymphocytes Relative 49 (*) 12 - 46 %   Lymphs Abs 3.1  0.7 - 4.0 K/uL   Monocytes Relative 8  3 - 12 %   Monocytes Absolute 0.5  0.1 - 1.0 K/uL   Eosinophils Relative 1  0 - 5 %   Eosinophils Absolute 0.0  0.0 - 0.7 K/uL   Basophils Relative 0  0 - 1 %   Basophils Absolute 0.0  0.0 - 0.1 K/uL  COMPREHENSIVE METABOLIC PANEL     Status: Abnormal   Collection Time    03/23/14  8:45 PM      Result Value Ref Range   Sodium 142  137 - 147 mEq/L   Potassium 4.3  3.7 - 5.3 mEq/L   Chloride 103  96 - 112 mEq/L   CO2 28  19 - 32 mEq/L   Glucose, Bld 85  70 - 99 mg/dL   BUN 14  6 - 23 mg/dL   Creatinine, Ser 0.78  0.50 - 1.35 mg/dL   Calcium 7.8 (*) 8.4 - 10.5 mg/dL   Total  Protein 6.2  6.0 - 8.3 g/dL   Albumin 2.9 (*) 3.5 - 5.2 g/dL   AST 19  0 - 37 U/L   ALT 17  0 - 53 U/L   Alkaline Phosphatase 127 (*) 39 - 117 U/L   Total Bilirubin 0.3  0.3 - 1.2 mg/dL   GFR calc non Af Amer >90  >90 mL/min   GFR calc Af Amer >90  >90 mL/min   Comment: (NOTE)     The eGFR has been calculated using the CKD EPI equation.     This calculation has not been validated in all clinical situations.     eGFR's persistently <90 mL/min signify possible Chronic Kidney     Disease.   Anion gap 11  5 - 15  ETHANOL     Status: Abnormal   Collection Time    03/23/14  8:45 PM      Result Value Ref Range   Alcohol, Ethyl (B) 309 (*) 0 - 11 mg/dL   Comment:            LOWEST DETECTABLE LIMIT FOR     SERUM ALCOHOL IS 11 mg/dL     FOR MEDICAL PURPOSES ONLY  ETHANOL     Status: Abnormal   Collection Time    03/24/14  5:30 AM      Result Value Ref Range   Alcohol, Ethyl (B) 202 (*) 0 - 11 mg/dL   Comment:            LOWEST DETECTABLE LIMIT FOR     SERUM ALCOHOL IS 11 mg/dL     FOR MEDICAL PURPOSES ONLY   Psychological Evaluations:  Assessment:   DSM5:  AXIS I:  Alcohol Abuse              Substance induced mood disorder   AXIS II:  Deferred AXIS III:   Past Medical History  Diagnosis Date  . Hypertension   . Urinary tract infection   . ETOH abuse    AXIS IV:  other psychosocial or environmental problems AXIS V:  41-50 serious symptoms  Treatment Plan/Recommendations:   1. Admit for crisis management and stabilization. Estimated length of stay 5-7 days. 2. Medication management to reduce current symptoms to base line and improve the patient's level of functioning.  3. Develop treatment plan to decrease risk of relapse upon discharge of depressive symptoms and the need for readmission. 5. Group therapy to facilitate development of healthy coping skills to use for depression and anxiety. 6. Health care follow up as needed for medical problems.  7. Discharge plan to include therapy to help patient cope with  stressors.  8. Call for Consult with Hospitalist for additional specialty patient services as needed.   Treatment Plan Summary: Daily contact with patient to assess and evaluate symptoms and progress in treatment Medication management Current Medications:  Current Facility-Administered Medications  Medication Dose Route Frequency Provider Last Rate Last Dose  . acetaminophen (TYLENOL) tablet 650 mg  650 mg Oral Q6H PRN Malena Peer, NP   650 mg at 03/24/14 2218  . alum & mag hydroxide-simeth (MAALOX/MYLANTA) 200-200-20 MG/5ML suspension 30 mL  30 mL Oral Q4H PRN Evanna Glenda Chroman, NP      . chlordiazePOXIDE (LIBRIUM) capsule 25 mg  25 mg Oral Q6H PRN Evanna Glenda Chroman, NP      . chlordiazePOXIDE (LIBRIUM) capsule 25 mg  25 mg Oral TID Malena Peer, NP   25 mg at 03/25/14 1203  Followed by  . [START ON 03/26/2014] chlordiazePOXIDE (LIBRIUM) capsule 25 mg  25 mg Oral BH-qamhs Evanna Glenda Chroman, NP       Followed by  . [START ON 03/28/2014] chlordiazePOXIDE (LIBRIUM) capsule 25 mg  25 mg Oral Daily Evanna Cori Greig Castilla, NP      . hydrOXYzine (ATARAX/VISTARIL) tablet 25 mg  25 mg Oral  Q6H PRN Evanna Glenda Chroman, NP      . labetalol (NORMODYNE) tablet 100 mg  100 mg Oral BID Malena Peer, NP   100 mg at 03/25/14 0811  . loperamide (IMODIUM) capsule 2-4 mg  2-4 mg Oral PRN Evanna Glenda Chroman, NP      . magnesium hydroxide (MILK OF MAGNESIA) suspension 30 mL  30 mL Oral Daily PRN Evanna Glenda Chroman, NP      . multivitamin with minerals tablet 1 tablet  1 tablet Oral Daily Evanna Glenda Chroman, NP   1 tablet at 03/25/14 0809  . ondansetron (ZOFRAN-ODT) disintegrating tablet 4 mg  4 mg Oral Q6H PRN Evanna Glenda Chroman, NP      . thiamine (VITAMIN B-1) tablet 100 mg  100 mg Oral Daily Evanna Cori Greig Castilla, NP   100 mg at 03/25/14 0809  . traZODone (DESYREL) tablet 50 mg  50 mg Oral QHS PRN,MR X 1 Evanna Cori Greig Castilla, NP   50 mg at 03/24/14 2216    Observation Level/Precautions:  15 minute checks  Laboratory:  CBC Chemistry Profile Alcohol level   Psychotherapy:  Individual and Group Therapy   Medications:  Librium protocol for alcohol detox   Consultations:  As needed   Discharge Concerns:  Continued alcohol abuse   Estimated LOS: 3-5 days   Other:  Explore rehab options with social worker    I certify that inpatient services furnished can reasonably be expected to improve the patient's condition.   Elmarie Shiley NP-C 7/24/201512:20 PM  I have personally seen the patient and agreed with the findings and involved in the treatment plan. Berniece Andreas, MD

## 2014-03-25 NOTE — Progress Notes (Signed)
D.  Pt remained in bed for duration of evening shift, little communication.  Denied complaints, did not attend evening AA group.  Minimal interaction with patient.  Denies SI/HI/hallucinations at this time.  A  Support and encouragement offered, fluids offered  R.  Pt remains safe on unit, will continue to monitor.

## 2014-03-25 NOTE — [facility] Group Notes (Signed)
[facility] LCSW Aftercare Discharge Planning Group Note   03/25/2014 9:30 AM  Participation Quality:  DID NOT ATTEND-pt in room resting.   Smart, Gelsey Amyx LCSWA    

## 2014-03-25 NOTE — Tx Team (Signed)
Interdisciplinary Treatment Plan Update (Adult)  Date: 03/25/2014   Time Reviewed: 11:40 AM  Progress in Treatment:  Attending groups: No.  Participating in groups: No.    Taking medication as prescribed: Yes  Tolerating medication: Yes  Family/Significant othe contact made: No. SPE not required for this pt.  Patient understands diagnosis: Yes, AEB seeking treatment for ETOH detox, mood stabilization, and med management. Discussing patient identified problems/goals with staff: Yes  Medical problems stabilized or resolved: Yes  Denies suicidal/homicidal ideation: Yes during admission/self report.  Patient has not harmed self or Others: Yes  New problem(s) identified:  Discharge Plan or Barriers: Pt not attending d/c planning at this time. CSW assessing for appropriate referrals.  Additional comments:  [facility]H abuse. He has no NKDA and medical hx includes kidney stones, HTN and ETOH abuse times 40 years. Pt is a 68 year old male who states," I have been drinking for over 40 years .1/5 of wine everyday." Pt also states that he lives in a boarding room and the only person he is friendly with is the owner of the house, Ethelene Brownsnthony. Pt presents malnourished and told the writer that he generally only eats one meal a day that he gets from [facility] [place] [employer]. Pt stated,"I do not take any medications, do not do drugs and do not smoke.He is estranged from his two children that both reside in [place].Pt admits he was married times 30 years. Pt denies SI and HI and contracts for safety. He ws brought onto the unit and given a pitcher of gatorade and refused lunch. Pt did state he would like to speak to a Child psychotherapistsocial worker to get connected to meals on wheels and other services he may qualify for.     Reason for Continuation of Hospitalization: Librium detox-withdrawals Mood stabilization Med managment Estimated length of stay: 3-5 days  For review of  initial/current patient goals, please see plan of care.  Attendees:  Patient:    Family:    Physician: NO MD ON UNIT DURING TX TEAM   Nursing: Maryjo RochesterShalita RN  03/25/2014 11:40 AM   Clinical Social Worker [employer] Sherwin-WilliamsHeather Smart, LCSWA  03/25/2014 11:40 AM   Other: Lowanda FosterBrittany RN  03/25/2014 11:40 AM   Other:    Other: Darden DatesJennifer C. Nurse CM 03/25/2014 11:40 AM   Other:    Scribe for Treatment Team:  [employer] Sherwin-WilliamsHeather Smart LCSWA 03/25/2014 11:40 AM

## 2014-03-25 NOTE — Progress Notes (Signed)
D: Patient denies SI/HI and A/V hallucinations; patient denies all pain and is reporting lightheadedness when standing; patient did not report any other withdrawal symptoms except fatigue  A: Monitored q 15 minutes; patient encouraged to attend groups; patient educated about medications; patient given medications per physician orders; patient encouraged to express feelings and/or concerns; patient given fluids and blood pressure assessed; patient also instructed about orthostatic hypotension and how he should move  R: Patient has been in the bed all day; patient has been asleep most of the day; patient is taking his medications as prescribed and tolerating medications; patient blood pressure and withdrawal symptoms will be reassessed

## 2014-03-26 NOTE — Tx Team (Signed)
Initial Interdisciplinary Treatment Plan  PATIENT STRENGTHS: (choose at least two) General fund of knowledge Motivation for treatment/growth  PATIENT STRESSORS: Financial difficulties Medication change or noncompliance Substance abuse   PROBLEM LIST: Problem List/Patient Goals Date to be addressed Date deferred Reason deferred Estimated date of resolution  Chemical dependency/Etoh detox                                                       DISCHARGE CRITERIA:  Ability to meet basic life and health needs Improved stabilization in mood, thinking, and/or behavior Withdrawal symptoms are absent or subacute and managed without 24-hour nursing intervention  PRELIMINARY DISCHARGE PLAN: Attend aftercare/continuing care group Attend 12-step recovery group Placement in alternative living arrangements  PATIENT/FAMIILY INVOLVEMENT: This treatment plan has been presented to and reviewed with the patient, Lockie MolaDouglas W Oquendo, and/or family member, .  The patient and family have been given the opportunity to ask questions and make suggestions.  Andrena Mewsuttall, Asyria Kolander J 03/26/2014, 12:49 AM

## 2014-03-26 NOTE — Progress Notes (Signed)
Patient ID: Ralph Jackson, male   DOB: [birth year], 68 y.o.   MRN: [id number] CSW met with Pt to complete PSA but Pt declined stating that he "didn't feel like it."  Pt agreeable to CSW following-up with him later.  Ralph Jackson, [name] 03/26/2014 12:56 PM

## 2014-03-26 NOTE — Progress Notes (Signed)
D:  Patient' self inventory sheet, patient slept good, stated he had sleep medication which helped him.  Fair appetite, low energy level, good concentration.  Rated depression, hopeless and anxiety 10.  Continues to experience tremors with withdrawals.  Denied SI.  Has felt lightheaded, dizzy in past 24 hours.    Worst pain #5 back, zero pain goal.  Goal is to get off alcohol.  Patient stated he drank alcohol 40 years, "uncle drank bad".  No questions for staff.  Would like to go to treatment center after discharge.  "If I don't go somewhere for help, I'm going to die."  Has medicare to help with medications, lives in boarding house.   A:  Emotional support and encouragement given patient. R:  Denied SI and HI.  Denied A/V hallucinations.  15 minute checks continue for safety.

## 2014-03-26 NOTE — [facility] Group Notes (Signed)
[facility] Group Notes:  (Nursing/MHT/Case Management/Adjunct)  Date:  03/26/2014  Time:  10:58 AM  Type of Therapy:  Psychoeducational Skills  Participation Level:  Did Not Attend  Buford DresserForrest, Socorro Ebron Shanta 03/26/2014, 10:58 AM

## 2014-03-26 NOTE — Progress Notes (Signed)
[facility] Group Notes:  (Nursing/MHT/Case Management/Adjunct)  Date:  03/26/2014  Time:  6:54 PM  Type of Therapy:  Psychoeducational Skills  Participation Level:  Did Not Attend  Summary of Progress/Problems: Pt was asleep in bed at the time of group.  Caswell CorwinOwen, Archie Atilano C 03/26/2014, 6:54 PM

## 2014-03-26 NOTE — Progress Notes (Signed)
Patient ID: Ralph Jackson, male   DOB: 1946-01-05, 68 y.o.   MRN: [id number] Pt alert and oriented resting in bed. Denies SI/HI, -A/hall. +V/hall reports seeing spider webs and round circles. Verbally contracts for safety. Denies pain but reports feeling very weak. Fluids and snacks encouraged. Medication given as ordered. Monitored Q 15min. Will continue to monitor and evaluate for stabilization.

## 2014-03-26 NOTE — [facility] Group Notes (Signed)
[facility] Group Notes:  (Nursing/MHT/Case Management/Adjunct)  Date:  03/26/2014  Time:  3:41 PM  Type of Therapy:  Psychoeducational Skills  Participation Level:  Did Not Attend  Ralph DresserForrest, Ralph Jackson 03/26/2014, 3:41 PM

## 2014-03-26 NOTE — [facility] Group Notes (Signed)
[facility] Group Notes: (Clinical Social Work)   03/26/2014      Type of Therapy:  Group Therapy   Participation Level:  Did Not Attend    Ambrose MantleMareida Grossman-Orr, LCSW 03/26/2014, 11:30 AM

## 2014-03-26 NOTE — Tx Team (Signed)
Initial Interdisciplinary Treatment Plan  PATIENT STRENGTHS: (choose at least two) Capable of independent living Motivation for treatment/growth  PATIENT STRESSORS: Substance abuse   PROBLEM LIST: Problem List/Patient Goals Date to be addressed Date deferred Reason deferred Estimated date of resolution  Alcohol abuse 03/24/14                                                      DISCHARGE CRITERIA:  Withdrawal symptoms are absent or subacute and managed without 24-hour nursing intervention  PRELIMINARY DISCHARGE PLAN: Attend 12-step recovery group Return to previous work or school arrangements  PATIENT/FAMIILY INVOLVEMENT: This treatment plan has been presented to and reviewed with the patient, Ralph Jackson  Ralph Jackson Ralph Jackson 03/26/2014, 3:34 AM

## 2014-03-26 NOTE — [facility] Group Notes (Signed)
[facility] Group Notes:  (Nursing/MHT/Case Management/Adjunct)  Date:  03/26/2014  Time:  10:59 AM  Type of Therapy:  Psychoeducational Skills  Participation Level:  Did Not Attend  Buford DresserForrest, Alee Katen Shanta 03/26/2014, 10:59 AM

## 2014-03-26 NOTE — Progress Notes (Addendum)
NP informed of patient's BP, morning medications held at this time per NP's instructions.  Patient stated he ate 100% breakfast in his room.  Continues to feel lightheaded,dizzy.  Patient given gatorade and snack.  O2 sat 100% room air.

## 2014-03-26 NOTE — Progress Notes (Signed)
The focus of this group is to educate the patient on the purpose and policies of crisis stabilization and provide a format to answer questions about their admission.  The group details unit policies and expectations of patients while admitted.  Patient did not attend morning nurse education orientation group.  Patient stayed in bed sleeping.  

## 2014-03-26 NOTE — Progress Notes (Signed)
Received t/c Ralph SpragueBeverly RN stated told by Np to hold medication. T/C to Alberteen SamFran Hobson NP reported BP values and advised to give Librium and no orders changed.

## 2014-03-26 NOTE — Progress Notes (Signed)
[facility]  03/26/2014 12:00 PM Ralph MolaDouglas W Jackson  MRN:  [id number] Subjective:   Assessed patient in bed, lethargic but arouses to verbal stimuli.  Positioned himself on side of bed.  He is frail Rates Depression 6/10  Anxiety 2/10  Reports sleep and appetite to be good He reports drinking heavy for 40 years, multiple hospital visits and admission. Recently drinking 4-5 bottles of wine a day.  He tells me he is hopeful for an extended rehabilitation program, "that is what I need to get better" Denies SIHI AVH  Diagnosis:   DSM5:  Substance/Addictive Disorders:  Alcohol Related Disorder - Severe (303.90) Depressive Disorders:  Major Depressive Disorder - Severe (296.23) Total Time spent with patient: 30 minutes  Axis I: Major Depression, Recurrent severe and Substance Abuse Axis II: Deferred Axis III:  Past Medical History  Diagnosis Date  . Hypertension   . Urinary tract infection   . ETOH abuse    Axis IV: economic problems, educational problems, housing problems, occupational problems, other psychosocial or environmental problems, problems with access to health care services and problems with primary support group Axis V: 41-50 serious symptoms  ADL's:  Impaired  Sleep: Fair  Appetite:  Good  Suicidal Ideation: denies Homicidal Ideation: Denies AEB (as evidenced by):  Psychiatric Specialty Exam: Physical Exam  Constitutional: He is oriented to person, place, and time. He appears well-developed.  Small and mal nourished appearing, patient reports "ive always been a small man"  HENT:  Head: Normocephalic and atraumatic.  Neck: Normal range of motion. Neck supple.  Musculoskeletal: Normal range of motion.  Neurological: He is alert and oriented to person, place, and time.  Skin: Skin is warm and dry.    ROS  Blood pressure 94/60, pulse 93, temperature 97.8 F (36.6 C), temperature source Oral, resp. rate 20, height 5\' 4"  (1.626 m), weight 43.092 kg (95  lb), SpO2 0.00%.Body mass index is 16.3 kg/(m^2).  General Appearance: Disheveled  Eye SolicitorContact::  Fair  Speech:  Garbled  Volume:  Decreased  Mood:  Depressed  Affect:  Flat  Thought Process:  Coherent  Orientation:  Full (Time, Place, and Person)  Thought Content:  Negative  Suicidal Thoughts:  No  Homicidal Thoughts:  No  Memory:  Immediate;   Fair Recent;   Fair Remote;   Fair  Judgement:  Impaired  Insight:  Lacking  Psychomotor Activity:  Decreased and Tremor  Concentration:  Poor  Recall:  Poor  Fund of Knowledge:Poor  Language: Fair  Akathisia:  Negative  Handed:  Right  AIMS (if indicated):     Assets:  Desire for Improvement Resilience  Sleep:  Number of Hours: 6.75   Musculoskeletal: Strength & Muscle Tone: decreased Gait & Station: unsteady Patient leans: N/A  Current Medications: Current Facility-Administered Medications  Medication Dose Route Frequency Provider Last Rate Last Dose  . acetaminophen (TYLENOL) tablet 650 mg  650 mg Oral Q6H PRN Audrea MuscatEvanna Cori Burkett, NP   650 mg at 03/25/14 1637  . alum & mag hydroxide-simeth (MAALOX/MYLANTA) 200-200-20 MG/5ML suspension 30 mL  30 mL Oral Q4H PRN Evanna Janann Augustori Burkett, NP      . chlordiazePOXIDE (LIBRIUM) capsule 25 mg  25 mg Oral Q6H PRN Audrea MuscatEvanna Cori Burkett, NP   25 mg at 03/25/14 2129  . chlordiazePOXIDE (LIBRIUM) capsule 25 mg  25 mg Oral BH-qamhs Evanna Janann Augustori Burkett, NP       Followed by  . [START ON 03/28/2014] chlordiazePOXIDE (LIBRIUM) capsule 25 mg  25 mg Oral Daily Evanna Janann August, NP      . hydrOXYzine (ATARAX/VISTARIL) tablet 25 mg  25 mg Oral Q6H PRN Evanna Janann August, NP      . labetalol (NORMODYNE) tablet 100 mg  100 mg Oral BID Audrea Muscat, NP   100 mg at 03/25/14 1637  . loperamide (IMODIUM) capsule 2-4 mg  2-4 mg Oral PRN Evanna Janann August, NP      . magnesium hydroxide (MILK OF MAGNESIA) suspension 30 mL  30 mL Oral Daily PRN Evanna Janann August, NP      . multivitamin with  minerals tablet 1 tablet  1 tablet Oral Daily Evanna Janann August, NP   1 tablet at 03/25/14 0809  . ondansetron (ZOFRAN-ODT) disintegrating tablet 4 mg  4 mg Oral Q6H PRN Audrea Muscat, NP   4 mg at 03/25/14 1636  . thiamine (VITAMIN B-1) tablet 100 mg  100 mg Oral Daily Evanna Cori Merry Proud, NP   100 mg at 03/25/14 0809  . traZODone (DESYREL) tablet 50 mg  50 mg Oral QHS PRN,MR X 1 Evanna Cori Merry Proud, NP   50 mg at 03/24/14 2216    Lab Results: No results found for this or any previous visit (from the past 48 hour(s)).  Physical Findings: AIMS: Facial and Oral Movements Muscles of Facial Expression: None, normal Lips and Perioral Area: None, normal Jaw: None, normal Tongue: None, normal,Extremity Movements Upper (arms, wrists, hands, fingers): None, normal Lower (legs, knees, ankles, toes): None, normal, Trunk Movements Neck, shoulders, hips: None, normal, Overall Severity Severity of abnormal movements (highest score from questions above): None, normal Incapacitation due to abnormal movements: None, normal Patient's awareness of abnormal movements (rate only patient's report): No Awareness, Dental Status Current problems with teeth and/or dentures?: No Does patient usually wear dentures?: No  CIWA:  CIWA-Ar Total: 4 COWS:  COWS Total Score: 4  Treatment Plan Summary: Daily contact with patient to assess and evaluate symptoms and progress in treatment Medication management -continue de-tox protocol -PT evaluation and treatment, consider SNF for physical rehabilitation  Medical Decision Making Problem Points:  Established problem, stable/improving (1) and Review of psycho-social stressors (1) Data Points:  Review of medication regiment & side effects (2) Review of new medications or change in dosage (2)  I certify that inpatient services furnished can reasonably be expected to improve the patient's condition.   Lorinda Creed PMHNP 03/26/2014, 12:00 PM I agreed with  findings and treatment plan of this patient

## 2014-03-27 NOTE — Progress Notes (Signed)
Pt refused to have AM vitals taken.

## 2014-03-27 NOTE — Progress Notes (Signed)
Patient did not attend the evening speaker AA meeting. Pt was notified that group was beginning and was offered assistance to the dayroom. Pt remained in bed.

## 2014-03-27 NOTE — [facility] Counselor (Signed)
Adult Comprehensive Assessment  Patient ID: Ralph Jackson, male   DOB: Oct 10, 1945, 68 y.o.   MRN: [id number]  Information Source: Information source: Patient  Current Stressors:  Educational / Learning stressors: n/a Employment / Job issues: n/a Family Relationships: n/a Surveyor, quantity / Lack of resources (include bankruptcy): n/a Housing / Lack of housing: n/a Physical health (include injuries & life threatening diseases): Pt reports daily weakness Social relationships: n/a Substance abuse: Pt reports consuming 1/5 of wine daily Bereavement / Loss: n/a  Living/Environment/Situation:  Living Arrangements: Non-relatives/Friends;Other (Comment) (Pt lives in a boarding house on [address] in [place]) Living conditions (as described by patient or guardian): "fine" How long has patient lived in current situation?: did not disclose What is atmosphere in current home: Comfortable  Family History:  Marital status: Divorced Divorced, when?: 30 years ago What types of issues is patient dealing with in the relationship?: n/a Additional relationship information: n/a Does patient have children?: Yes How many children?: 2 How is patient's relationship with their children?: daughter is supportive; son is in jail  Childhood History:  By whom was/is the patient raised?: Grandparents Description of patient's relationship with caregiver when they were a child: "they are good people"; reported feeling safe and cared for Patient's description of current relationship with people who raised him/her: caregivers are deceased Does patient have siblings?: Yes Number of Siblings: 1 Description of patient's current relationship with siblings: older sister; deceased Did patient suffer any verbal/emotional/physical/sexual abuse as a child?: No Did patient suffer from severe childhood neglect?: No Has patient ever been sexually abused/assaulted/raped as an adolescent or adult?: No Was the patient  ever a victim of a crime or a disaster?: No Witnessed domestic violence?: No Has patient been effected by domestic violence as an adult?: No  Education:  Highest grade of school patient has completed: 12th Currently a student?: No Learning disability?: No  Employment/Work Situation:   Employment situation:  (retired) Patient's job has been impacted by current illness: No What is the longest time patient has a held a job?: 10 years Where was the patient employed at that time?: [employer] Has patient ever been in the [employer]?: No Has patient ever served in Buyer, retail?: No  Financial Resources:   Surveyor, quantity resources: [employer] (Haematologist) Does patient have a Lawyer or guardian?: No  Alcohol/Substance Abuse:   What has been your use of drugs/alcohol within the last 12 months?: 1/5 of wine daily for 40 years If attempted suicide, did drugs/alcohol play a role in this?: No Alcohol/Substance Abuse Treatment Hx: Past Tx, Inpatient If yes, describe treatment: [facility] Has alcohol/substance abuse ever caused legal problems?: Yes (DUI charges in the past)  Social Support System:   Patient's Community Support System: Production assistant, radio System: supportive daughter and friends Type of faith/religion: n/a How does patient's faith help to cope with current illness?: n/a  Leisure/Recreation:   Leisure and Hobbies: Research scientist (medical) and movies on TV  Strengths/Needs:   What things does the patient do well?: "building things" In what areas does patient struggle / problems for patient: "nothing right now"  Discharge Plan:   Does patient have access to transportation?: No Plan for no access to transportation at discharge: public transit Will patient be returning to same living situation after discharge?: Yes Currently receiving community mental health services: No If no, would patient like referral for services when discharged?: Yes (What  county?) (inpatient long-term substance abuse treatment) Does patient have financial barriers related to  discharge medications?: No  Summary/Recommendations:   Ralph Jackson is an 68 y.o. male who presents seeking detox. He reports drinking a fifth of wine daily for the last 40 years. He states he's had a period of sobriety that lasted around 7 years when he was in his twenties, but that his most recent period of sobriety was some time last month and only lasted about 30 days. He states he is seeking detox because he knows that he's going to die if he doesn't.  Pt is requesting referral to long-term treatment facility. Pt [address] [place]. Pt presents with frial posture, with signs of withdrawal such as sweating, shaky hands, and delayed processing speeds. Patient will benefit from crisis stabilization, medication evaluation, group therapy and psycho education in addition to case management for discharge planning.      Elaina Hoopsarter, Oscar Hank M. 03/27/2014

## 2014-03-27 NOTE — Progress Notes (Signed)
[facility] MD Progress Note  03/27/2014 10:00 AM Ralph Jackson  MRN:  [id number] Subjective:    Assessed patient in his room; he is more alert and ambulates more stable today. He is frail Rates Depression 6/10  Anxiety 2/10  Reports sleep and appetite to be good.  He expresses interest an extended rehabilitation program, "that is what I need to get better", but is not attending groups or participating in his treatment plan, has remained in bed most of day.  Encouraged participation.  Denies SIHI AVH  Diagnosis:   DSM5:  Substance/Addictive Disorders:  Alcohol Related Disorder - Severe (303.90) Depressive Disorders:  Major Depressive Disorder - Severe (296.23) Total Time spent with patient: 25 min  Axis I: Major Depression, Recurrent severe and Substance Abuse Axis II: Deferred Axis III:  Past Medical History  Diagnosis Date  . Hypertension   . Urinary tract infection   . ETOH abuse    Axis IV: economic problems, educational problems, housing problems, occupational problems, other psychosocial or environmental problems, problems with access to health care services and problems with primary support group Axis V: 41-50 serious symptoms  ADL's:  Impaired  Sleep: Fair  Appetite:  Good  Suicidal Ideation: denies Homicidal Ideation: Denies AEB (as evidenced by):  Psychiatric Specialty Exam: Physical Exam  Constitutional: He is oriented to person, place, and time. He appears well-developed.  Small and mal nourished appearing, patient reports "ive always been a small man"  HENT:  Head: Normocephalic and atraumatic.  Neck: Normal range of motion. Neck supple.  Musculoskeletal: Normal range of motion.  Neurological: He is alert and oriented to person, place, and time.  Skin: Skin is warm and dry.    ROS  Blood pressure 109/80, pulse 86, temperature 98.2 F (36.8 C), temperature source Oral, resp. rate 22, height 5\' 4"  (1.626 m), weight 43.092 kg (95 lb), SpO2 0.00%.Body mass  index is 16.3 kg/(m^2).  General Appearance: Disheveled  Eye Solicitor::  Fair  Speech:  Garbled  Volume:  Decreased  Mood:  Depressed  Affect:  Flat  Thought Process:  Coherent  Orientation:  Full (Time, Place, and Person)  Thought Content:  Negative  Suicidal Thoughts:  No  Homicidal Thoughts:  No  Memory:  Immediate;   Fair Recent;   Fair Remote;   Fair  Judgement:  Impaired  Insight:  Lacking  Psychomotor Activity:  Decreased and Tremor  Concentration:  Poor  Recall:  Poor  Fund of Knowledge:Poor  Language: Fair  Akathisia:  Negative  Handed:  Right  AIMS (if indicated):     Assets:  Desire for Improvement Resilience  Sleep:  Number of Hours: 6   Musculoskeletal: Strength & Muscle Tone: decreased Gait & Station: unsteady Patient leans: N/A  Current Medications: Current Facility-Administered Medications  Medication Dose Route Frequency Provider Last Rate Last Dose  . acetaminophen (TYLENOL) tablet 650 mg  650 mg Oral Q6H PRN Audrea Muscat, NP   650 mg at 03/25/14 1637  . alum & mag hydroxide-simeth (MAALOX/MYLANTA) 200-200-20 MG/5ML suspension 30 mL  30 mL Oral Q4H PRN Evanna Janann August, NP      . chlordiazePOXIDE (LIBRIUM) capsule 25 mg  25 mg Oral Q6H PRN Audrea Muscat, NP   25 mg at 03/25/14 2129  . [START ON 03/28/2014] chlordiazePOXIDE (LIBRIUM) capsule 25 mg  25 mg Oral Daily Evanna Cori Burkett, NP      . hydrOXYzine (ATARAX/VISTARIL) tablet 25 mg  25 mg Oral Q6H PRN Evanna Janann August, NP      .  labetalol (NORMODYNE) tablet 100 mg  100 mg Oral BID Audrea MuscatEvanna Cori Burkett, NP   100 mg at 03/27/14 [id number]  . loperamide (IMODIUM) capsule 2-4 mg  2-4 mg Oral PRN Evanna Janann Augustori Burkett, NP      . magnesium hydroxide (MILK OF MAGNESIA) suspension 30 mL  30 mL Oral Daily PRN Evanna Janann Augustori Burkett, NP      . multivitamin with minerals tablet 1 tablet  1 tablet Oral Daily Evanna Janann Augustori Burkett, NP   1 tablet at 03/27/14 [phone]  . ondansetron (ZOFRAN-ODT) disintegrating  tablet 4 mg  4 mg Oral Q6H PRN Audrea MuscatEvanna Cori Burkett, NP   4 mg at 03/25/14 1636  . thiamine (VITAMIN B-1) tablet 100 mg  100 mg Oral Daily Evanna Cori Merry ProudBurkett, NP   100 mg at 03/27/14 [id number]  . traZODone (DESYREL) tablet 50 mg  50 mg Oral QHS PRN,MR X 1 Evanna Cori Merry ProudBurkett, NP   50 mg at 03/24/14 2216    Lab Results: No results found for this or any previous visit (from the past 48 hour(s)).  Physical Findings: AIMS: Facial and Oral Movements Muscles of Facial Expression: None, normal Lips and Perioral Area: None, normal Jaw: None, normal Tongue: None, normal,Extremity Movements Upper (arms, wrists, hands, fingers): None, normal Lower (legs, knees, ankles, toes): None, normal, Trunk Movements Neck, shoulders, hips: None, normal, Overall Severity Severity of abnormal movements (highest score from questions above): None, normal Incapacitation due to abnormal movements: None, normal Patient's awareness of abnormal movements (rate only patient's report): No Awareness, Dental Status Current problems with teeth and/or dentures?: No Does patient usually wear dentures?: No  CIWA:  CIWA-Ar Total: 5 COWS:  COWS Total Score: 4  Treatment Plan Summary: Daily contact with patient to assess and evaluate symptoms and progress in treatment Medication management -continue de-tox protocol -PT evaluation and treatment, consider SNF for physical rehabilitation -Continue crisis management and stabilization. Estimated length of stay 5-7 days   -Medication management to reduce current symptoms to base line and improve patient's overall level of functioning    (no side effects reported) -Individual and group therapy encouraged -Coping skills for depression, substance abuse, and anxiety discussed -Treat health problems as indicated. - Develop treatment plan to decrease risk of relapse upon discharge and the need for readmission -Psych-social education regarding relapse prevention and self care. -  Health  care follow up as needed for medical problems -Continue home medications where appropriate    Medical Decision Making Problem Points:  Established problem, stable/improving (1) and Review of psycho-social stressors (1) Data Points:  Review of medication regiment & side effects (2) Review of new medications or change in dosage (2)  I certify that inpatient services furnished can reasonably be expected to improve the patient's condition.   Lorinda CreedLARACH, MARY PMHNP 03/27/2014, 10:00 AM I agreed with findings and treatment plan of this patient

## 2014-03-27 NOTE — Progress Notes (Signed)
Patient ID: Ralph MolaDouglas W Petruzzi, male   DOB: Jan 23, 1946, 68 y.o.   MRN: [id number] D: Patient is lying in bed today.  He has not been attending groups or participating in his treatment.  He reports fair sleep; good appetite; hyperactivity and good concentration.  He denies any depression or hopelessness.  He refused to have his vitals taken this morning.  He lists tremors as his only withdrawal symptom today.  He denies SI/HI/AVH.  A: Continue to assess patient.  Encourage and support patient as necessary.  Complete 15 minute checks per protocol.  R: Patient has minimal interaction with staff.

## 2014-03-27 NOTE — Progress Notes (Addendum)
D Pt. Denies SI and HI, reports slight tremor as only  Sign of withdrawal but pt. Is lying in bed with a low BP and continues to be unsteady at times.    A Writer offered support and encouragement,  Did not adm. The lebatolol that had been moved to 8pm d/t to low BP earlier BP is now 98/57 pulse 66 sitting and 81/54 with pulse 66 when standing.  Writer held lebatolol again and encouraged pt. To drink gatorade due to weakness and unsteadiness when ambulating.    R Pt. Remains safe on the unit.  Pt. States  He will drink the gatorade, will continue to monitor for safety.  Pt. Did drink some gatorade and ate some peanut butter on graham crackers for Clinical research associatewriter.  BP did go up to 140/86 with 64 pulse.  Pt. Was adm. His lebatolol

## 2014-03-27 NOTE — [facility] Group Notes (Signed)
[facility] Group Notes:  (Nursing/MHT/Case Management/Adjunct)  Date:  03/27/2014  Time:  6:37 PM  Type of Therapy:  Psychoeducational Skills  Participation Level:  Active  Participation Quality:  Appropriate  Affect:  Appropriate  Cognitive:  Appropriate  Insight:  Appropriate  Engagement in Group:  Engaged  Modes of Intervention:  Discussion  Summary of Progress/Problems: Pt did attend self inventory group, pt reported that he was negative SI/HI, no AH/VH noted. Pt rated his depression as a 5, and his helplessness/hopelessness as a 0.      Jacquelyne BalintForrest, Kayceon Oki Shanta 03/27/2014, 6:37 PM

## 2014-03-27 NOTE — [facility] Group Notes (Signed)
[facility] LCSW Group Therapy 03/27/2014   Type of Therapy: Group Therapy- Feelings Around Discharge & Establishing a Supportive Framework  Did not attend.  Genever Hentges Carter, LCSWA 03/27/2014 12:12 PM     

## 2014-03-27 NOTE — [facility] Group Notes (Signed)
[facility] Group Notes:  (Nursing/MHT/Case Management/Adjunct)  Date:  03/27/2014  Time:  6:57 PM  Type of Therapy:  Psychoeducational Skills  Participation Level:  Active  Participation Quality:  Appropriate  Affect:  Appropriate  Cognitive:  Appropriate  Insight:  Appropriate  Engagement in Group:  Engaged  Modes of Intervention:  Discussion  Summary of Progress/Problems: Pt did attend health support systems group, he noted his daughter.   Jacquelyne BalintForrest, French Kendra Shanta 03/27/2014, 6:57 PM

## 2014-03-28 MED ORDER — NALTREXONE HCL 50 MG PO TABS
25.0000 mg | ORAL_TABLET | Freq: Every day | ORAL | Status: DC
  Administered 2014-03-28 – 2014-03-30 (×3): 25 mg via ORAL
  Filled 2014-03-28 (×6): qty 1

## 2014-03-28 NOTE — Progress Notes (Signed)
Patient ID: Ralph Jackson, male   DOB: [birth year], 68 y.o.   MRN: [id number] PER STATE REGULATIONS 482.30  THIS CHART WAS REVIEWED FOR MEDICAL NECESSITY WITH RESPECT TO THE PATIENT'S ADMISSION/ DURATION OF STAY.  NEXT REVIEW DATE: 04/01/2014  Ralph RoughJENNIFER JONES [name], RN, BSN CASE MANAGER

## 2014-03-28 NOTE — Progress Notes (Addendum)
[facility]  03/28/2014 4:12 PM Ralph MolaDouglas W Jackson  MRN:  [id number] Subjective:    Pt was interviewed in MD office. Pt reports feeling weak, but better than yesterday.  Rates Depression 4/10  Anxiety 0/10  Reports sleep and appetite to be good. No withdrawal symptoms reported. When he goes to bed, he reports thinking "I want to get drunk" or "I want a bottle of wine".  He expresses interest an extended rehabilitation program, "that is what I need to get better", but is not attending groups (states he was not woken up) or participating in his treatment plan, has remained in bed most of day.  Encouraged participation.  Denies SI/HI/AH. He reports seeing spots x 2-3 days.  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 Hypertension   . Urinary tract infection   . ETOH abuse    Axis IV: economic problems, educational problems, housing problems, occupational problems, other psychosocial or environmental problems, problems with access to health care services and problems with primary support group Axis V: 41-50 serious symptoms  ADL's:  Impaired  Sleep: Fair  Appetite:  Good  Suicidal Ideation: denies Homicidal Ideation: Denies AEB (as evidenced by):  Psychiatric Specialty Exam: Physical Exam  Constitutional: He is oriented to person, place, and time. He appears well-developed.  Small and mal nourished appearing, patient reports "ive always been a small man"  HENT:  Head: Normocephalic and atraumatic.  Neck: Normal range of motion. Neck supple.  Musculoskeletal: Normal range of motion.  Neurological: He is alert and oriented to person, place, and time.  Skin: Skin is warm and dry.     ROS  Blood pressure 98/68, pulse 83, temperature 96.4 F (35.8 C), temperature source Oral, resp. rate 24, height 5\' 4"  (1.626 m), weight 43.092 kg (95 lb), SpO2 0.00%.Body mass index is 16.3 kg/(m^2).  General Appearance: Disheveled  Eye SolicitorContact::  Fair  Speech:  Garbled  Volume:  Decreased  Mood:  Depressed  Affect:  Flat  Thought Process:  Coherent  Orientation:  Full (Time, Place, and Person)  Thought Content:  Hallucinations: Visual "seeing spots"  Suicidal Thoughts:  No  Homicidal Thoughts:  No  Memory:  Immediate;   Fair Recent;   Fair Remote;   Fair  Judgement:  Impaired  Insight:  Lacking  Psychomotor Activity:  Decreased and Tremor  Concentration:  Poor  Recall:  Poor  Fund of Knowledge:Poor  Language: Fair  Akathisia:  Negative  Handed:  Right  AIMS (if indicated):     Assets:  Desire for Improvement Resilience  Sleep:  Number of Hours: 5.5   Musculoskeletal: Strength & Muscle Tone: decreased Gait & Station: unsteady Patient leans: N/A  Current Medications: Current Facility-Administered Medications  Medication Dose Route Frequency Provider Last Rate Last Dose  . acetaminophen (TYLENOL) tablet 650 mg  650 mg Oral Q6H PRN Audrea MuscatEvanna Cori Burkett, NP   650 mg at 03/27/14 1538  . alum & mag hydroxide-simeth (MAALOX/MYLANTA) 200-200-20 MG/5ML suspension 30 mL  30 mL Oral Q4H PRN Evanna Janann Augustori Burkett, NP      . labetalol (NORMODYNE) tablet 100 mg  100 mg Oral BID Audrea MuscatEvanna Cori Burkett, NP   100 mg at 03/28/14 0858  . magnesium hydroxide (  MILK OF MAGNESIA) suspension 30 mL  30 mL Oral Daily PRN Audrea Muscat, NP      . multivitamin with minerals tablet 1 tablet  1 tablet Oral Daily Evanna Janann August, NP   1 tablet at 03/28/14 0858  . thiamine (VITAMIN B-1) tablet 100 mg  100 mg Oral Daily Evanna Cori Merry Proud, NP   100 mg at 03/28/14 0858  . traZODone (DESYREL) tablet 50 mg  50 mg Oral QHS PRN,MR X 1 Evanna Cori Merry Proud, NP   50 mg at 03/24/14 2216    Lab  Results: No results found for this or any previous visit (from the past 48 hour(s)).  Physical Findings: AIMS: Facial and Oral Movements Muscles of Facial Expression: None, normal Lips and Perioral Area: None, normal Jaw: None, normal Tongue: None, normal,Extremity Movements Upper (arms, wrists, hands, fingers): None, normal Lower (legs, knees, ankles, toes): None, normal, Trunk Movements Neck, shoulders, hips: None, normal, Overall Severity Severity of abnormal movements (highest score from questions above): None, normal Incapacitation due to abnormal movements: None, normal Patient's awareness of abnormal movements (rate only patient's report): No Awareness, Dental Status Current problems with teeth and/or dentures?: No Does patient usually wear dentures?: No  CIWA:  CIWA-Ar Total: 3 COWS:  COWS Total Score: 4  Treatment Plan Summary: Daily contact with patient to assess and evaluate symptoms and progress in treatment Medication management -continue de-tox protocol -PT evaluation and treatment, consider SNF for physical rehabilitation -Continue crisis management and stabilization. Estimated length of stay 5-7 days   -Medication management to reduce current symptoms to base line and improve patient's overall level of functioning    (no side effects reported) -Individual and group therapy encouraged -Coping skills for depression, substance abuse, and anxiety discussed -Treat health problems as indicated. - Develop treatment plan to decrease risk of relapse upon discharge and the need for readmission -Psych-social education regarding relapse prevention and self care. -  Health care follow up as needed for medical problems -Continue home medications where appropriate    Medical Decision Making Problem Points:  Established problem, stable/improving (1) and Review of psycho-social stressors (1) Data Points:  Review of medication regiment & side effects (2) Review of new  medications or change in dosage (2)  I certify that inpatient services furnished can reasonably be expected to improve the patient's condition.   Ancil Linsey MD  03/28/2014, 4:12 PM  Will start naltrexone 25 mg daily for alcohol dependence (cravings for alcohol). Dose may be increased in 2-3 days to 50 mg daily (if tolerated). Consider campral if naltrexone not tolerated. Labs reviewed. AST/ALT wnl. Discussed r/b/se/alt of naltrexone with pt, who gave verbal informed consent for med. Pt denies h/o of opiate abuse/dependence. Pt denies taking opiate pain meds for a long time.

## 2014-03-28 NOTE — [facility] Group Notes (Signed)
[facility]   03/28/2014 10:04 AM  Participation Quality:  Appropriate   Mood/Affect:  Appropriate  Depression Rating:  0  Anxiety Rating:  0  Thoughts of Suicide:  No Will you contract for safety?   NA  Current AVH:  No  Plan for Discharge/Comments:  Pt reports that he feels weak this morning with mild withdrawal symptoms. Pt interested in long term SA treatment. CSW assessing if pt meets criteria for [facility].   Transportation Means: unknown at this time.   Supports: no identified supports.   Smart, American FinancialHeather LCSWA

## 2014-03-28 NOTE — [facility] Group Notes (Signed)
[facility] LCSW Group Therapy  03/28/2014 3:16 PM  Type of Therapy:  Group Therapy  Participation Level:  Did Not Attend-pt in room resting.   Smart, Kaitlin Alcindor LCSWA  03/28/2014, 3:16 PM

## 2014-03-28 NOTE — Progress Notes (Signed)
Adult Psychoeducational Group Note  Date:  03/28/2014 Time:  10:00 am  Group Topic/Focus:  Wellness Toolbox:   The focus of this group is to discuss various aspects of wellness, balancing those aspects and exploring ways to increase the ability to experience wellness.  Patients will create a wellness toolbox for use upon discharge.  Participation Level:  Active  Participation Quality:  Appropriate, Sharing and Supportive  Affect:  Appropriate  Cognitive:  Appropriate  Insight: Appropriate  Engagement in Group:  Engaged  Modes of Intervention:  Discussion, Education, Socialization and Support  Additional Comments:  Pt stated that he needs to stop hanging around people who drink alcohol. Pt also stated that he needs to attend 90 meetings in 90 days. The pt identified barriers to his progress as idle time due to a recent retirement and his friends and family.   Evelisse Szalkowski 03/28/2014, 2:51 PM

## 2014-03-28 NOTE — Progress Notes (Signed)
D Pt. Denies SI and HI, no complaints of pain or discomfort noted.  A Writer offered support and encouragement.  R Pt. Is resting quietly in bed, not going to the groups this pm, reports he had a better day today, hands still trembling but is steadier than previous days, pt. Appears to have bathed.  Pt. Remains safe on the unit.

## 2014-03-28 NOTE — Evaluation (Signed)
Physical Therapy Evaluation Patient Details Name: Ralph MolaDouglas W Stout MRN: [id number] DOB: 1946/07/02 Today's Date: 03/28/2014   History of Present Illness  68 year old male who [facility] from [facility]. Patient reported drinking a fifth of wine daily for the last 40 years.  [facility]  Clinical Impression  Pt currently with functional limitations due to the deficits listed below (see PT Problem List). Pt will benefit from skilled PT to increase their independence and safety with mobility to allow discharge to the venue listed below.  Pt presents with generalized weakness and unsteady without assistive device.  Pt reports being [facility] with [facility] and RN reports pt ambulating. Pt agreeable to use RW or w/c for mobility for safety.  Pt reports d/c to alcohol rehab facility and recommended using assistive devices upon d/c.  If pt does not d/c to rehab, recommend ST-SNF.     Follow Up Recommendations Supervision for mobility/OOB;SNF    Equipment Recommendations  Rolling walker with 5" wheels    Recommendations for Other Services       Precautions / Restrictions Precautions Precautions: Fall      Mobility  Bed Mobility Overal bed mobility: Modified Independent                Transfers Overall transfer level: Needs assistance Equipment used: None Transfers: Sit to/from Stand Sit to Stand: Supervision;Modified independent (Device/Increase time)         General transfer comment: unsteady upon standing, reports he [facility]  Ambulation/Gait Ambulation/Gait assistance: Min guard Ambulation Distance (Feet): 120 Feet Assistive device: Rolling walker (2 wheeled) Gait Pattern/deviations: Step-through pattern;Trunk flexed     General Gait Details: verbal cues for safe use of RW, feels more steady with use of RW  Stairs            Wheelchair Mobility    Modified Rankin (Stroke  Patients Only)       Balance Overall balance assessment: Needs assistance         Standing balance support: No upper extremity supported Standing balance-Leahy Scale: Fair Standing balance comment: pt able to stand without support however unable to march in place, needs UE support                             Pertinent Vitals/Pain No complaints    Home Living Family/patient expects to be discharged to:: Private residence Living Arrangements: Non-relatives/Friends;Other (Comment) (boarding house)           Home Layout: Two level Home Equipment: None      Prior Function Level of Independence: Independent               Hand Dominance        Extremity/Trunk Assessment               Lower Extremity Assessment: Generalized weakness         Communication   Communication: No difficulties  Cognition Arousal/Alertness: Awake/alert Behavior During Therapy: Flat affect Overall Cognitive Status: Within Functional Limits for tasks assessed                      General Comments      Exercises        Assessment/Plan    PT Assessment Patient needs continued PT services  PT Diagnosis Difficulty walking;Generalized weakness   PT Problem  List Decreased strength;Decreased balance;Decreased activity tolerance;Decreased mobility;Decreased knowledge of use of DME  PT Treatment Interventions DME instruction;Gait training;Functional mobility training;Patient/family education;Wheelchair mobility training;Therapeutic activities;Therapeutic exercise;Balance training;Neuromuscular re-education   PT Goals (Current goals can be found in the Care Plan section) Acute Rehab PT Goals PT Goal Formulation: With patient Time For Goal Achievement: 04/04/14 Potential to Achieve Goals: Good    Frequency Min 2X/week   Barriers to discharge        Co-evaluation               End of Session   Activity Tolerance: Patient tolerated treatment  well Patient left: in bed Nurse Communication: Mobility status    Functional Assessment Tool Used: clinical judgement Functional Limitation: Mobility: Walking and moving around Mobility: Walking and Moving Around Current Status (Z6109): At least 1 percent but less than 20 percent impaired, limited or restricted Mobility: Walking and Moving Around Goal Status [phone]): 0 percent impaired, limited or restricted    Time: 1359-1410 PT Time Calculation (min): 11 min   Charges:   PT Evaluation $Initial PT Evaluation Tier I: 1 Procedure PT Treatments $Gait Training: 8-22 mins   PT G Codes:   Functional Assessment Tool Used: clinical judgement Functional Limitation: Mobility: Walking and moving around    Jansen E 03/28/2014, 2:29 PM Zenovia Jarred, PT, DPT 03/28/2014 Pager: [phone]

## 2014-03-28 NOTE — B[facility] Group Notes (Signed)
Adult Psychoeducational Group Note  Date:  03/28/2014 Time:  11:28 PM  Group Topic/Focus:  Wrap-Up Group:   The focus of this group is to help patients review their daily goal of treatment and discuss progress on daily workbooks.  Participation Level:  Did Not Attend  Participation Quality:  None  Affect:  None  Cognitive:  None  Insight: None  Engagement in Group:  None  Modes of Intervention:  None  Additional Comments:  Pt did not attend group.  Delia ChimesRufus, Zaide Mcclenahan L 03/28/2014, 11:28 PM

## 2014-03-28 NOTE — Progress Notes (Signed)
D) Pt. Has been noted resting in room and is seclusive to self at times. PT consult done.  Pt. Affect blunted and mood appears depressed.  Pt. Appears anxious and reports mild tactile stimulation.  No other c/o offered.  A) Pt. Encouraged to use walker ad lib and wheel chair PRN. Pt. Offered comfort measures and medicated per MD order.  CIWA per order.  R) Pt. Receptive and cooperative.  Approaches staff minimally.  Continues on q 15 min .observations and remains safe at this time.

## 2014-03-29 NOTE — Progress Notes (Signed)
Patient did not attend AA meeting. He slept in his bed instead.  Rosilyn MingsMingia, Maddock Finigan A 11:26 PM

## 2014-03-29 NOTE — Tx Team (Signed)
Interdisciplinary Treatment Plan Update (Adult)  Date: 03/29/2014   Time Reviewed: 11:03 AM  Progress in Treatment:  Attending groups: No.  Participating in groups: No.    Taking medication as prescribed: Yes  Tolerating medication: Yes  Family/Significant othe contact made: No. SPE not required for this pt.  Patient understands diagnosis: Yes, AEB seeking treatment for ETOH detox, mood stabilization, and med management. Discussing patient identified problems/goals with staff: Yes  Medical problems stabilized or resolved: Yes  Denies suicidal/homicidal ideation: Yes during admission/self report.  Patient has not harmed self or Others: Yes  New problem(s) identified:  Discharge Plan or Barriers: Pt wants [facility] referral. CSW explained that pt does not meet criteria-medicare not accepted. [facility] referral made. CSW assessing.  Additional comments:  Pt urinating before able to make it to restroom. PA notified/RN notified.     Reason for Continuation of Hospitalization: [facility] detox-withdrawals Mood stabilization Med managment Estimated length of stay: 1-3 days  For review of initial/current patient goals, please see plan of care.  Attendees:  Patient:    Family:    Physician: NO MD ON UNIT DURING TX TEAM   Nursing:  03/29/2014 11:03 AM   Clinical Social Worker [employer] Sherwin-WilliamsHeather Smart, LCSWA  03/29/2014 11:03 AM   Other: Chandra BatchAggie N. PA 03/29/2014 11:03 AM   Other:    Other: Darden DatesJennifer C. Nurse CM 03/29/2014 11:03 AM   Other:    Scribe for Treatment Team:  Trula SladeHeather Smart LCSWA 03/29/2014 11:03 AM

## 2014-03-29 NOTE — Progress Notes (Signed)
The focus of this group is to educate the patient on the purpose and policies of crisis stabilization and provide a format to answer questions. The focus of this group is to educate the patient on the purpose and policies of crisis stabilization and provide a format to answer questions about their admission.  The group details unit policies and expectations of patients while admitted.during their admission.  The group details unit policies and expectations of patients while admitted.  Patient did not attend.

## 2014-03-29 NOTE — Progress Notes (Signed)
(  D) Patient remains in bed with covers pulled over his head. Patient has Depends on and it is dry at this time. Patient has been observed going to the restroom. Patient has walker near bedside for his use as needed. (A) Patient encouraged to request help when MHT or RN are in room to assess for Q15 minute checks. Patient forwards little and states that he has no needs at this time. (R) Encouragement and support provided. Patient remained on unit during dinner. A tray will be provided.

## 2014-03-29 NOTE — Progress Notes (Signed)
Writer spoke with patient regarding the Depends that he is wearing. Patient states that he has gotten up and gone to the bathroom and is able to pull the diaper down to urinate and pull it back up. Depends is dry at this time

## 2014-03-29 NOTE — Progress Notes (Deleted)
The focus of this group is to educate the patient on the purpose and policies of crisis stabilization and provide a format to answer questions about their admission.  The group details unit policies and expectations of patients while admitted. Patient did not attend. 

## 2014-03-29 NOTE — Progress Notes (Signed)
Recreation Therapy Notes  Animal-Assisted Activity/Therapy (AAA/T) Program Checklist/Progress Notes Patient Eligibility Criteria Checklist & Daily Group note for Rec Tx Intervention  Date: 07.28.2015 Time: 3:15pm Location: [address] Dayroom   AAA/T Program Assumption of Risk Form signed by Patient/ or Parent Legal Guardian yes  Patient is free of allergies or sever asthma yes  Patient reports no fear of animals yes  Patient reports no history of cruelty to animals yes   Patient understands his/her participation is voluntary yes  Behavioral Response: Did not attend.    Ralph Jackson Ralph Jackson, LRT/CTRS  Ralph Jackson Ralph 03/29/2014 4:33 PM 

## 2014-03-29 NOTE — Progress Notes (Signed)
Patient ID: Lockie MolaDouglas W Dunlevy, male   DOB: 1946/02/20, 68 y.o.   MRN: [id number]  D: Patient with dull, flat affect endorsing depression. Pt secluded and withdrawn with no peer interaction. Pt denies SI or plans to harm himself. A: Q 15 minute safety checks, encourage staff/peer interaction and group participation. Administer medications as ordered by MD. R: No s/s of distress noted at this time.

## 2014-03-29 NOTE — Progress Notes (Signed)
Patient shows no interest in completing daily self inventory. Patient refused assistance.

## 2014-03-29 NOTE — [facility] Group Notes (Signed)
[facility] Therapy 03/29/2014 1:15pm  Pt did not attend  Chad CordialLauren Carter, LCSWA 03/29/2014 2:49 PM

## 2014-03-29 NOTE — Progress Notes (Signed)
(  D) Patient in bed this AM. Staff have observed patient urinating in the room. Patient does not use the urinal provided. Patient refusing to accept assistance with showering. Staff reports that patient showered on 03/28/14 but patient is emitting a strong body odor. Patient has long fingernails that are packed with dirt under the tips. Patient presents as mildly anxious. Patient wakes up for meds but forwards little. Writer conferred with Press photographercharge nurse. Patient is currently wearing an adult diaper.

## 2014-03-30 DIAGNOSIS — F1910 Other psychoactive substance abuse, uncomplicated: Secondary | ICD-10-CM

## 2014-03-30 DIAGNOSIS — F332 Major depressive disorder, recurrent severe without psychotic features: Secondary | ICD-10-CM

## 2014-03-30 MED ORDER — NALTREXONE HCL 50 MG PO TABS
50.0000 mg | ORAL_TABLET | Freq: Every day | ORAL | Status: DC
  Administered 2014-03-31 – 2014-04-05 (×6): 50 mg via ORAL
  Filled 2014-03-30 (×7): qty 1
  Filled 2014-03-30: qty 4

## 2014-03-30 NOTE — Progress Notes (Signed)
Pt did not attend NA group this evening.  

## 2014-03-30 NOTE — [facility] Group Notes (Signed)
[facility] LCSW Group Therapy  03/30/2014 3:15 PM  Type of Therapy:  Group Therapy  Participation Level:  Did Not Attend-pt in room resting.   Smart, Particia Strahm LCSWA  03/30/2014, 3:15 PM

## 2014-03-30 NOTE — [facility] Group Notes (Signed)
[facility]   03/30/2014 10:49 AM  Participation Quality:  Minimal   Mood/Affect:  Appropriate  Depression Rating:  0  Anxiety Rating:  2-3  Thoughts of Suicide:  No Will you contract for safety?   NA  Current AVH:  No  Plan for Discharge/Comments:  Pt reports that he still feels weak this morning. Pt not accepted to [facility] or [facility] due [place] [employer] Pt states that he is interested in IOP and returning home/going to [employer] meetings. CSW assessing. PT reports mild withdrawals and reports good sleep and appetite.   Transportation Means: friend   Supports: friend   Counselling psychologistmart, OncologistHeather LCSWA

## 2014-03-30 NOTE — Progress Notes (Signed)
NSG shift assessment. 7a-7p.   D: Affect blunted, mood depressed, behavior appropriate. High fall risk r/t unsteadiness. Wearing yellow socks and staff aware to assist pt with mobility. Attends groups and participates. Cooperative with staff and is getting along well with peers.   A: Observed pt interacting in group and in the milieu: Support and encouragement offered. Safety maintained with observations every 15 minutes.   R:  Contracts for safety. Following treatment plan.

## 2014-03-30 NOTE — Progress Notes (Signed)
Patient ID: Ralph Jackson, male   DOB: [birth year], 68 y.o.   MRN: [id number] [facility]  03/30/2014 4:07 PM Ralph Jackson  MRN:  [id number]  Subjective: Ralph Jackson reports that he is doing fairly well. He continues to have some tremors to bilateral hands. He appears sluggish and frail, however, able to walk from his room to the day room and the cafeteria.  He once expressed interest in an extended rehabilitation program, as he thinks, that is what he needs to get better. However, is not participating fully in the group sessions and or AA/NA meetings.  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 Hypertension   . Urinary tract infection   . ETOH abuse    Axis IV: economic problems, educational problems, housing problems, occupational problems, other psychosocial or environmental problems, problems with access to health care services and problems with primary support group Axis V: 41-50 serious symptoms  ADL's:  Impaired  Sleep: Fair  Appetite:  Good  Suicidal Ideation:  Denies  Homicidal Ideation:  Denies  AEB (as evidenced by):  Psychiatric Specialty Exam: Physical Exam  Constitutional: He is oriented to person, place, and time. He appears well-developed.  Small and mal nourished appearing, patient reports "ive always been a small man"  HENT:  Head: Normocephalic and atraumatic.  Neck: Normal range of motion. Neck supple.  Musculoskeletal: Normal range of motion.  Neurological: He is alert and oriented to person, place, and time.  Skin: Skin is warm and dry.    ROS  Blood pressure 105/72, pulse 85, temperature 97.7 F (36.5 C), temperature source Oral, resp.  rate 20, height 5\' 4"  (1.626 m), weight 43.092 kg (95 lb), SpO2 100.00%.Body mass index is 16.3 kg/(m^2).  General Appearance: Disheveled  Eye SolicitorContact::  Fair  Speech:  Garbled  Volume:  Decreased  Mood:  Depressed  Affect:  Flat  Thought Process:  Coherent  Orientation:  Full (Time, Place, and Person)  Thought Content:  Rumination  Suicidal Thoughts:  No  Homicidal Thoughts:  No  Memory:  Immediate;   Fair Recent;   Fair Remote;   Fair  Judgement:  Impaired  Insight:  Lacking  Psychomotor Activity:  Tremor  Concentration:  Poor  Recall:  Poor  Fund of Knowledge:Poor  Language: Fair  Akathisia:  Negative  Handed:  Right  AIMS (if indicated):     Assets:  Desire for Improvement Resilience  Sleep:  Number of Hours: 5.75   Musculoskeletal: Strength & Muscle Tone: decreased Gait & Station: unsteady Patient leans: N/A  Current Medications: Current Facility-Administered Medications  Medication Dose Route Frequency Provider Last Rate Last Dose  . acetaminophen (TYLENOL) tablet 650 mg  650 mg Oral Q6H PRN Audrea MuscatEvanna Cori Burkett, NP   650 mg at 03/27/14 1538  . alum & mag hydroxide-simeth (MAALOX/MYLANTA) 200-200-20 MG/5ML suspension 30 mL  30 mL Oral Q4H PRN Evanna Janann Augustori Burkett, NP      . labetalol (NORMODYNE) tablet 100 mg  100 mg Oral BID Audrea MuscatEvanna Cori Burkett, NP   100 mg at 03/30/14 0856  . magnesium hydroxide (MILK OF MAGNESIA) suspension 30 mL  30 mL Oral Daily PRN Evanna Janann Augustori Burkett, NP      . multivitamin  with minerals tablet 1 tablet  1 tablet Oral Daily Evanna Janann August, NP   1 tablet at 03/30/14 0856  . naltrexone (DEPADE) tablet 25 mg  25 mg Oral Daily Caprice Kluver, MD   25 mg at 03/30/14 0858  . thiamine (VITAMIN B-1) tablet 100 mg  100 mg Oral Daily Evanna Cori Merry Proud, NP   100 mg at 03/30/14 0857  . traZODone (DESYREL) tablet 50 mg  50 mg Oral QHS PRN,MR X 1 Evanna Cori Merry Proud, NP   50 mg at 03/24/14 2216    Lab Results: No results found for this or any  previous visit (from the past 48 hour(s)).  Physical Findings: AIMS: Facial and Oral Movements Muscles of Facial Expression: None, normal Lips and Perioral Area: None, normal Jaw: None, normal Tongue: None, normal,Extremity Movements Upper (arms, wrists, hands, fingers): None, normal Lower (legs, knees, ankles, toes): None, normal, Trunk Movements Neck, shoulders, hips: None, normal, Overall Severity Severity of abnormal movements (highest score from questions above): None, normal Incapacitation due to abnormal movements: None, normal Patient's awareness of abnormal movements (rate only patient's report): No Awareness, Dental Status Current problems with teeth and/or dentures?: No Does patient usually wear dentures?: No  CIWA:  CIWA-Ar Total: 0 COWS:  COWS Total Score: 4  Treatment Plan Summary: Daily contact with patient to assess and evaluate symptoms and progress in treatment Medication management  Plan: Supportive approach, relapse prevention. 2. Increased Naltrexone from 25 mg to 50 mg for alcoholism. 3. Consider SNF placement for physical rehabilitation 3. Continue crisis management and stabilization.  4. Medication management to reduce current symptoms to base line and improve patient's overall level of functioning  5. Treat health problems as indicated.  Medical Decision Making Problem Points:  Established problem, stable/improving (1) and Review of psycho-social stressors (1) Data Points:  Review of medication regiment & side effects (2) Review of new medications or change in dosage (2)  I certify that inpatient services furnished can reasonably be expected to improve the patient's condition.   Armandina Stammer I, PMHNP-BC 03/30/2014, 4:07 PM

## 2014-03-31 NOTE — Progress Notes (Signed)
Pt has been up and active in the milieu today. He has been up for groups and he was able to shower today.  He refused to shave his concerned about the "small razors" and stated,"I rather shave when I get home" still encouraged him to shave and we could assist.  He rated both his depression and anxiety a 2 and hopelessness a 4 on his self-inventory.  He did admit to having some tremors, cravings and irritability from withdrawal but denied any need for any prn medications. His goal today, "work on my alcohol use by attending AA meetings" [facility]

## 2014-03-31 NOTE — Progress Notes (Signed)
Patient ID: Ralph Jackson, male   DOB: 12-14-1945, 68 y.o.   MRN: [id number] D: Patient in room on approach. Pt presented with depressed mood and flat affect. Pt is frail in bed and did not attend group. Pt denies any withdrawal symptoms.  Pt denies SI/HI/AVH. Cooperative with assessment. No acute distressed noted at this time.   A: Met with pt 1:1. Medications administered as prescribed. Writer encouraged pt to discuss feelings. Pt encouraged to come to staff with any question or concerns.   R: Patient remains safe. He is complaint with medications and denies any adverse reaction. Continue current POC.

## 2014-03-31 NOTE — [facility] Group Notes (Signed)
[facility] LCSW Group Therapy  03/31/2014 3:07 PM  Type of Therapy:  Group Therapy  Participation Level:  Did Not Attend-pt in room resting.   Smart, Ralph Jackson LCSWA  03/31/2014, 3:07 PM

## 2014-03-31 NOTE — Progress Notes (Signed)
Patient ID: Ralph Jackson, male   DOB: 1945/12/31, 68 y.o.   MRN: [id number] Patient ID: Ralph Jackson, male   DOB: 23-Nov-1945, 68 y.o.   MRN: [id number] [facility] MD Progress Note  03/31/2014 3:39 PM Ralph Jackson  MRN:  [id number]  Subjective: Ralph Jackson reports that he continues to do fairly well. He is still having some mild tremors to bilateral hands. He appears sluggish and frail, however, able to walk with the use walker and wheel chair.  He once expressed interest in an extended rehabilitation program, as he thinks, that is what he needs to get better. Is participating in some group sessions..  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 Hypertension   . Urinary tract infection   . ETOH abuse    Axis IV: economic problems, educational problems, housing problems, occupational problems, other psychosocial or environmental problems, problems with access to health care services and problems with primary support group Axis V: 41-50 serious symptoms  ADL's:  Impaired  Sleep: Fair  Appetite:  Good  Suicidal Ideation:  Denies  Homicidal Ideation:  Denies  AEB (as evidenced by):  Psychiatric Specialty Exam: Physical Exam  Constitutional: He is oriented to person, place, and time. He appears well-developed.  Small and mal nourished appearing, patient reports "ive always been a small man"  HENT:  Head: Normocephalic and atraumatic.  Neck: Normal range of motion. Neck supple.  Musculoskeletal: Normal range of motion.  Neurological: He is alert and oriented to person, place, and time.  Skin: Skin is warm and dry.    ROS  Blood pressure 116/78, pulse 71, temperature 97.8 F  (36.6 C), temperature source Oral, resp. rate 18, height 5\' 4"  (1.626 m), weight 43.092 kg (95 lb), SpO2 100.00%.Body mass index is 16.3 kg/(m^2).  General Appearance: Disheveled  Eye Solicitor::  Fair  Speech:  Garbled  Volume:  Decreased  Mood:  Depressed  Affect:  Flat  Thought Process:  Coherent  Orientation:  Full (Time, Place, and Person)  Thought Content:  Rumination  Suicidal Thoughts:  No  Homicidal Thoughts:  No  Memory:  Immediate;   Fair Recent;   Fair Remote;   Fair  Judgement:  Impaired  Insight:  Lacking  Psychomotor Activity:  Mild tremors  Concentration:  Fair  Recall:  Poor  Fund of Knowledge:Poor  Language: Fair  Akathisia:  Negative  Handed:  Right  AIMS (if indicated):     Assets:  Desire for Improvement Resilience  Sleep:  Number of Hours: 5.5   Musculoskeletal: Strength & Muscle Tone: decreased Gait & Station: unsteady Patient leans: N/A  Current Medications: Current Facility-Administered Medications  Medication Dose Route Frequency Provider Last Rate Last Dose  . acetaminophen (TYLENOL) tablet 650 mg  650 mg Oral Q6H PRN Audrea Muscat, NP   650 mg at 03/30/14 2039  . alum & mag hydroxide-simeth (MAALOX/MYLANTA) 200-200-20 MG/5ML suspension 30 mL  30 mL Oral Q4H PRN Evanna Janann August, NP      . labetalol (NORMODYNE) tablet 100 mg  100 mg Oral BID Audrea Muscat, NP   100 mg at 03/31/14 0865  . magnesium hydroxide (MILK OF MAGNESIA) suspension 30 mL  30 mL Oral Daily PRN Evanna Janann August,  NP      . multivitamin with minerals tablet 1 tablet  1 tablet Oral Daily Evanna Janann Augustori Burkett, NP   1 tablet at 03/31/14 0834  . naltrexone (DEPADE) tablet 50 mg  50 mg Oral Daily Sanjuana KavaAgnes I Jerard Bays, NP   50 mg at 03/31/14 0834  . thiamine (VITAMIN B-1) tablet 100 mg  100 mg Oral Daily Evanna Cori Merry ProudBurkett, NP   100 mg at 03/31/14 16100833  . traZODone (DESYREL) tablet 50 mg  50 mg Oral QHS PRN,MR X 1 Evanna Cori Merry ProudBurkett, NP   50 mg at 03/24/14 2216    Lab  Results: No results found for this or any previous visit (from the past 48 hour(s)).  Physical Findings: AIMS: Facial and Oral Movements Muscles of Facial Expression: None, normal Lips and Perioral Area: None, normal Jaw: None, normal Tongue: None, normal,Extremity Movements Upper (arms, wrists, hands, fingers): None, normal Lower (legs, knees, ankles, toes): None, normal, Trunk Movements Neck, shoulders, hips: None, normal, Overall Severity Severity of abnormal movements (highest score from questions above): None, normal Incapacitation due to abnormal movements: None, normal Patient's awareness of abnormal movements (rate only patient's report): No Awareness, Dental Status Current problems with teeth and/or dentures?: No Does patient usually wear dentures?: No  CIWA:  CIWA-Ar Total: 1 COWS:  COWS Total Score: 4  Treatment Plan Summary: Daily contact with patient to assess and evaluate symptoms and progress in treatment Medication management  Plan: Supportive approach, relapse prevention. 2. Continue Naltrexone at 50 mg for alcoholism. 3. Consider SNF placement for physical rehabilitation 3. Continue crisis management and stabilization.  4. Medication management to reduce current symptoms to base line and improve patient's overall level of functioning  5. Treat health problems as indicated.  Medical Decision Making Problem Points:  Established problem, stable/improving (1) and Review of psycho-social stressors (1) Data Points:  Review of medication regiment & side effects (2) Review of new medications or change in dosage (2)  I certify that inpatient services furnished can reasonably be expected to improve the patient's condition.   Ralph Jackson, Ralph Jackson I, PMHNP-BC 03/31/2014, 3:39 PM

## 2014-03-31 NOTE — [facility] Group Notes (Signed)
0900 nursing orientation group    The focus of this group is to educate the patient on the purpose and policies of crisis stabilization and provide a format to answer questions about their admission.  The group details unit policies and expectations of patients while admitted.   Pt was an active participant in group.  He was appropriate in sharing that his "children make me happy"

## 2014-03-31 NOTE — Progress Notes (Signed)
Psychoeducational Group Note  Date:  03/31/2014 Time: 2100  Group Topic/Focus:  wrap up group  Participation Level: Did Not Attend  Participation Quality:  Not Applicable  Affect:  Not Applicable  Cognitive:  Not Applicable  Insight:  Not Applicable  Engagement in Group: Not Applicable  Additional Comments:  Pt was notified that group was beginning but remained in bed.   Shelah LewandowskySquires, Cervando Durnin Carol 03/31/2014, 10:55 PM

## 2014-03-31 NOTE — Progress Notes (Signed)
D: Patient in be dasleep on first approach.  Patients goal for today is to try to stop drniking and t try to attend AA meetings.  Patient did get up for dinner tonight and for medications but got in the bed afterward.  Patient denies SI/HI and denies AVH. A: Staff to monitor Q 15 mins for safety.  Encouragement and support offered.  Scheduled medications administered per orders. R: Patient remains safe on the unit.  Patient did not attend group tonight.  Patient taking adminstered medications.

## 2014-04-01 NOTE — [facility] Group Notes (Signed)
[facility] Note  04/01/2014  10:09 AM  Participation Quality: Did Not Attend.  Samuella BruinKristin Christophere Hillhouse, MSW, [employer] IncLCSWA Clinical Social Worker [facility] [phone]

## 2014-04-01 NOTE — Progress Notes (Signed)
Patient ID: Ralph Jackson, male   DOB: 1946/01/07, 68 y.o.   MRN: [id number] PER STATE REGULATIONS 482.30  THIS CHART WAS REVIEWED FOR MEDICAL NECESSITY WITH RESPECT TO THE PATIENT'S ADMISSION/ DURATION OF STAY.  NEXT REVIEW DATE: 04/05/2014  Ralph RoughJENNIFER JONES [name], RN, BSN CASE MANAGER

## 2014-04-01 NOTE — [facility] Group Notes (Signed)
[facility] LCSW Group Therapy 03/14/2014 1:15 PM   Type of Therapy: Group Therapy  Participation Level: Did Not Attend    Evian Salguero, MSW, LCSWA Clinical Social Worker [facility] [phone]    

## 2014-04-01 NOTE — [facility] Group Notes (Signed)
Adult Psychoeducational Group Note  Date:  04/01/2014 Time:  11:08 PM  Group Topic/Focus:  AA Meeting  Participation Level:  Minimal  Participation Quality:  Attentive  Affect:  Flat  Cognitive:  Alert  Insight: Good  Engagement in Group:  Limited  Modes of Intervention:  Discussion and Education  Additional Comments:  [name] stated he was an alcoholic and wanted to get help.  Caroll RancherLindsay, Kendricks Reap A 04/01/2014, 11:08 PM

## 2014-04-01 NOTE — Progress Notes (Addendum)
Ralph Jackson is OOB UAL on the 300 hall today...tolerated well. He is medicated per MD order and he takes his meds according to  Schedule.    A He denies withdrawal complications, but agrees he has had feelings of anxiety today . He says he worries about when he goes home...who will stop him from drinking??? He complains of unsteadiness on his feet and his NP is made aware of today's BP  - after his 0800 dose of normodyne 100 mg was held by this nurse  - and NP says " walk..." He is instructed to do so this morning by this Clinical research associatewriter.He completes his morning self assessment and on it he writes he denies feeling SI within the past 24 hrs and he says his DC plan is  Incomplete as of today.   R Safety is in place and poc cont.

## 2014-04-01 NOTE — Progress Notes (Signed)
D: Pt presents with flat, sad affect and depressed mood.  Denies SI/HI.  Denies AH/VH.  Reports "I'm just tired from all of these medications I get."   A: No medications scheduled for administration this evening.  Denies need for PRN medications. Encouragement and support offered. R: Pt interacted with peers and staff minimally.  Attended evening group and then went to to bed.  Pt is in no acute distress.  Respirations even, unlabored, WNL. Will continue to monitor for safety.

## 2014-04-01 NOTE — Progress Notes (Signed)
Patient ID: Ralph Jackson, male   DOB: 02-Jan-1946, 68 y.o.   MRN: [id number] Patient ID: Ralph Jackson, male   DOB: 28-May-1946, 68 y.o.   MRN: [id number] [facility] MD Progress Note  04/01/2014 5:00 PM Ralph Jackson  MRN:  [id number]  Subjective: Pt interviewed in his room. Chart reviewed. Leviticus reports that he continues to do fairly well. He is still having some mild tremors to bilateral hands, which pt reports is chronic. Pt does not feel that naltrexone made tremors worse. Pt continues to report cravings for alcohol. No current withdrawal symptoms reported. He appears sluggish and frail, however, able to walk with the use walker and wheel chair.  He once expressed interest in an extended rehabilitation program, as he thinks, that is what he needs to get better. Is participating in some group sessions. Sleep/appetite good. Pt denies side effects of meds. Pt denies SI/HI/AVH.  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 Hypertension   . Urinary tract infection   . ETOH abuse    Axis IV: economic problems, educational problems, housing problems, occupational problems, other psychosocial or environmental problems, problems with access to health care services and problems with primary support group Axis V: 41-50 serious symptoms  ADL's:  Impaired  Sleep: Fair  Appetite:  Good  Suicidal Ideation:  Denies  Homicidal Ideation:  Denies  AEB (as evidenced by):  Psychiatric Specialty Exam: Physical Exam  Constitutional: He is oriented to person, place, and time. He appears well-developed.  Small and mal nourished appearing, patient reports "ive always been a small man"  HENT:   Head: Normocephalic and atraumatic.  Neck: Normal range of motion. Neck supple.  Musculoskeletal: Normal range of motion.  Neurological: He is alert and oriented to person, place, and time.  Skin: Skin is warm and dry.    ROS  Blood pressure 99/71, pulse 75, temperature 97.5 F (36.4 C), temperature source Oral, resp. rate 20, height 5\' 4"  (1.626 m), weight 43.092 kg (95 lb), SpO2 100.00%.Body mass index is 16.3 kg/(m^2).  General Appearance: Disheveled  Eye Solicitor::  Fair  Speech:  Garbled  Volume:  Decreased  Mood:  Depressed  Affect:  Flat  Thought Process:  Coherent  Orientation:  Full (Time, Place, and Person)  Thought Content:  Rumination  Suicidal Thoughts:  No  Homicidal Thoughts:  No  Memory:  Immediate;   Fair Recent;   Fair Remote;   Fair  Judgement:  Impaired  Insight:  Lacking  Psychomotor Activity:  Mild tremors  Concentration:  Fair  Recall:  Poor  Fund of Knowledge:Poor  Language: Fair  Akathisia:  Negative  Handed:  Right  AIMS (if indicated):     Assets:  Desire for Improvement Resilience  Sleep:  Number of Hours: 5.75   Musculoskeletal: Strength & Muscle Tone: decreased Gait & Station: unsteady Patient leans: N/A  Current Medications: Current Facility-Administered Medications  Medication Dose Route Frequency Provider Last Rate Last Dose  . acetaminophen (TYLENOL) tablet 650 mg  650 mg Oral Q6H PRN Audrea Muscat, NP   650 mg at 03/31/14 1810  . alum & mag hydroxide-simeth (MAALOX/MYLANTA) 200-200-20 MG/5ML suspension 30 mL  30 mL Oral Q4H PRN Evanna Janann August, NP      .  labetalol (NORMODYNE) tablet 100 mg  100 mg Oral BID Audrea MuscatEvanna Cori Burkett, NP   100 mg at 03/31/14 1713  . magnesium hydroxide (MILK OF MAGNESIA) suspension 30 mL  30 mL Oral Daily PRN Evanna Janann Augustori Burkett, NP      . multivitamin with minerals tablet 1 tablet  1 tablet Oral Daily Evanna Janann Augustori Burkett, NP   1 tablet at 04/01/14 0830  . naltrexone (DEPADE) tablet 50 mg  50 mg  Oral Daily Sanjuana KavaAgnes I Nwoko, NP   50 mg at 04/01/14 0830  . thiamine (VITAMIN B-1) tablet 100 mg  100 mg Oral Daily Evanna Cori Merry ProudBurkett, NP   100 mg at 04/01/14 0830  . traZODone (DESYREL) tablet 50 mg  50 mg Oral QHS PRN,MR X 1 Evanna Cori Merry ProudBurkett, NP   50 mg at 03/24/14 2216    Lab Results: No results found for this or any previous visit (from the past 48 hour(s)).  Physical Findings: AIMS: Facial and Oral Movements Muscles of Facial Expression: None, normal Lips and Perioral Area: None, normal Jaw: None, normal Tongue: None, normal,Extremity Movements Upper (arms, wrists, hands, fingers): None, normal Lower (legs, knees, ankles, toes): None, normal, Trunk Movements Neck, shoulders, hips: None, normal, Overall Severity Severity of abnormal movements (highest score from questions above): None, normal Incapacitation due to abnormal movements: None, normal Patient's awareness of abnormal movements (rate only patient's report): No Awareness, Dental Status Current problems with teeth and/or dentures?: No Does patient usually wear dentures?: No  CIWA:  CIWA-Ar Total: 1 COWS:  COWS Total Score: 4  Treatment Plan Summary: Daily contact with patient to assess and evaluate symptoms and progress in treatment Medication management  Plan: Supportive approach, relapse prevention. 2. Continue Naltrexone at 50 mg for alcoholism. 3. Consider SNF placement for physical rehabilitation 3. Continue crisis management and stabilization.  4. Medication management to reduce current symptoms to base line and improve patient's overall level of functioning  5. Treat health problems as indicated.  Medical Decision Making Problem Points:  Established problem, stable/improving (1) and Review of psycho-social stressors (1) Data Points:  Review of medication regiment & side effects (2) Review of new medications or change in dosage (2)  I certify that inpatient services furnished can reasonably be expected to  improve the patient's condition.   Ancil LinseySARANGA, Kimmy Totten, MD  04/01/2014, 5:00 PM

## 2014-04-01 NOTE — Tx Team (Signed)
  Interdisciplinary Treatment Plan Update (Adult) Date: 04/01/2014    Time Reviewed: 10:30 am   Progress in Treatment: Attending groups: Minimally Participating in groups: Minimally Taking medication as prescribed: Yes Tolerating medication: Yes Family/Significant other contact made: CSW assessing Patient understands diagnosis: Yes Discussing patient identified problems/goals with staff: Yes Medical problems stabilized or resolved: Yes Denies suicidal/homicidal ideation: Yes Issues/concerns per patient self-inventory: Yes Other:   New problem(s) identified: N/A   Discharge Plan or Barriers: CSW assessing for appropriate referrals.     Reason for Continuation of Hospitalization: Anxiety Depression Medication Stabilization   Comments: N/A   Estimated length of stay: 3-5 days   For review of initial/current patient goals, please see plan of care.   Attendees: Patient:      Family:      Physician:     Nursing:    Clinical Social Worker: Samuella BruinKristin Sue Mcalexander, LCSWA  04/01/2014  10:30 AM Other:      Other: Serena ColonelAggie Nwoko, NP  04/01/2014  10:30 AM    Other:      Other:      Other:      Other:      Other:      Other:      Other:         Scribe for Treatment Team:  Samuella BruinKristin Justeen Hehr, MSW, [employer]CSWA [phone]

## 2014-04-01 NOTE — Progress Notes (Signed)
Assumed care of patient at 2300. Observed resting in bed with eyes closed. RR WNL, even and unlabored. No distress noted. Level III obs in place for safety and pt is safe. Lawrence MarseillesFriedman, Shemuel Harkleroad Eakes

## 2014-04-02 NOTE — [facility] Group Notes (Signed)
[facility] Group Notes:  (Nursing/MHT/Case Management/Adjunct)  Date:  04/02/2014  Time:  0900 am  Type of Therapy:  Psychoeducational Skills  Participation Level:  Active  Participation Quality:  Appropriate  Affect:  Appropriate  Cognitive:  Alert  Insight:  Lacking  Engagement in Group:  Engaged  Modes of Intervention:  Support  Summary of Progress/Problems: Patient discussed his goals upon discharge.  He plans on attending 90 meetings in 90 days and get a sponsor.  His main concern was transportation to meetings.  Group members supported patient and gave him suggestions.  Cranford MonBeaudry, Zarielle Cea Evans 04/02/2014, 10:10 AM

## 2014-04-02 NOTE — Progress Notes (Signed)
Patient ID: Ralph Jackson, male   DOB: 01/21/1946, 68 y.o.   MRN: [id number] Patient ID: Ralph Jackson, male   DOB: 01/06/1946, 68 y.o.   MRN: [id number] Patient ID: Ralph Jackson, male   DOB: [birth year], 68 y.o.   MRN: [id number] [facility] MD Progress Note  04/02/2014 4:00 PM Ralph Jackson  MRN:  [id number]  Subjective: Pt interviewed in his room. Chart reviewed. Ralph Jackson reports that he continues to do fairly well. Says he is a little sad today because he learned that his son in law had heart attack and is at the hospital. He hopes he is doing well at the hospital. Ralph Jackson is out and about today, participating in groups. He is in no apparent distress.  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 Hypertension   . Urinary tract infection   . ETOH abuse    Axis IV: economic problems, educational problems, housing problems, occupational problems, other psychosocial or environmental problems, problems with access to health care services and problems with primary support group Axis V: 41-50 serious symptoms  ADL's:  Impaired  Sleep: Fair  Appetite:  Good  Suicidal Ideation:  Denies  Homicidal Ideation:  Denies  AEB (as evidenced by):  Psychiatric Specialty Exam: Physical Exam  Constitutional: He is oriented to person, place, and time. He appears well-developed.  Small and mal nourished appearing, patient reports "ive always been a small man"  HENT:  Head: Normocephalic and atraumatic.  Neck: Normal range of motion. Neck supple.  Musculoskeletal: Normal range of motion.  Neurological: He is alert and oriented to person, place, and time.  Skin: Skin is warm and dry.     ROS  Blood pressure 139/74, pulse 62, temperature 98.2 F (36.8 C), temperature source Oral, resp. rate 18, height 5\' 4"  (1.626 m), weight 43.092 kg (95 lb), SpO2 100.00%.Body mass index is 16.3 kg/(m^2).  General Appearance: Disheveled  Eye Solicitor::  Fair  Speech:  Garbled  Volume:  Decreased  Mood:  Depressed  Affect:  Flat  Thought Process:  Coherent  Orientation:  Full (Time, Place, and Person)  Thought Content:  Rumination  Suicidal Thoughts:  No  Homicidal Thoughts:  No  Memory:  Immediate;   Fair Recent;   Fair Remote;   Fair  Judgement:  Impaired  Insight:  Lacking  Psychomotor Activity:  Mild tremors  Concentration:  Fair  Recall:  Poor  Fund of Knowledge:Poor  Language: Fair  Akathisia:  Negative  Handed:  Right  AIMS (if indicated):     Assets:  Desire for Improvement Resilience  Sleep:  Number of Hours: 5   Musculoskeletal: Strength & Muscle Tone: decreased Gait & Station: unsteady Patient leans: N/A  Current Medications: Current Facility-Administered Medications  Medication Dose Route Frequency Provider Last Rate Last Dose  . acetaminophen (TYLENOL) tablet 650 mg  650 mg Oral Q6H PRN Audrea Muscat, NP   650 mg at 03/31/14 1810  . alum & mag hydroxide-simeth (MAALOX/MYLANTA) 200-200-20 MG/5ML suspension 30 mL  30 mL Oral Q4H PRN Evanna Janann August, NP      . labetalol (NORMODYNE) tablet 100 mg  100 mg Oral BID Audrea Muscat, NP   100 mg at 04/02/14 0809  .  magnesium hydroxide (MILK OF MAGNESIA) suspension 30 mL  30 mL Oral Daily PRN Evanna Janann Augustori Burkett, NP      . multivitamin with minerals tablet 1 tablet  1 tablet Oral Daily Evanna Janann Augustori Burkett, NP   1 tablet at 04/02/14 0809  . naltrexone (DEPADE) tablet 50 mg  50 mg Oral Daily Sanjuana KavaAgnes I Vivi Piccirilli, NP   50 mg at 04/02/14 0809  . thiamine (VITAMIN B-1) tablet 100 mg  100 mg Oral Daily Evanna Cori Merry ProudBurkett, NP   100 mg at 04/02/14 0810  . traZODone (DESYREL) tablet 50 mg  50 mg Oral QHS PRN,MR X 1  Evanna Cori Merry ProudBurkett, NP   50 mg at 03/24/14 2216    Lab Results: No results found for this or any previous visit (from the past 48 hour(s)).  Physical Findings: AIMS: Facial and Oral Movements Muscles of Facial Expression: None, normal Lips and Perioral Area: None, normal Jaw: None, normal Tongue: None, normal,Extremity Movements Upper (arms, wrists, hands, fingers): None, normal Lower (legs, knees, ankles, toes): None, normal, Trunk Movements Neck, shoulders, hips: None, normal, Overall Severity Severity of abnormal movements (highest score from questions above): None, normal Incapacitation due to abnormal movements: None, normal Patient's awareness of abnormal movements (rate only patient's report): No Awareness, Dental Status Current problems with teeth and/or dentures?: No Does patient usually wear dentures?: No  CIWA:  CIWA-Ar Total: 1 COWS:  COWS Total Score: 4  Treatment Plan Summary: Daily contact with patient to assess and evaluate symptoms and progress in treatment Medication management  Plan: Supportive approach, relapse prevention. 2. Medication management to reduce current symptoms to base line and improve patient's overall level of functioning   Continue Naltrexone at 50 mg for alcoholism, Trazodone 50 mg Q hs for sleep . 3. Consider SNF placement for physical rehabilitation 4. Continue crisis management and stabilization.   5. Treat health problems as indicated.  Medical Decision Making Problem Points:  Established problem, stable/improving (1) and Review of psycho-social stressors (1) Data Points:  Review of medication regiment & side effects (2) Review of new medications or change in dosage (2)  I certify that inpatient services furnished can reasonably be expected to improve the patient's condition.   Armandina Stammerwoko, Shandel Busic I, PMHNP 04/02/2014, 4:00 PM

## 2014-04-02 NOTE — Progress Notes (Signed)
[facility] Group Notes:  (Nursing/MHT/Case Management/Adjunct)  Date:  04/02/2014  Time:  5:13 PM  Type of Therapy:  Psychoeducational Skills  Participation Level:  Active  Participation Quality:  Appropriate and Attentive  Affect:  Appropriate  Cognitive:  Appropriate  Insight:  Appropriate  Engagement in Group:  Engaged and Supportive  Modes of Intervention:  Activity  Summary of Progress/Problems: Pts played a game of Pictionary using coping skills.  Ralph Jackson C 04/02/2014, 5:13 PM 

## 2014-04-02 NOTE — [facility] Group Notes (Signed)
[facility] Group Notes:  (Nursing/MHT/Case Management/Adjunct)  Date:  04/02/2014  Time:  4:12 PM  Type of Therapy:  Nurse Education  Participation Level:  Active  Participation Quality:  Appropriate, Sharing and Supportive  Affect:  Flat  Cognitive:  Alert and Appropriate  Insight:  Lacking  Engagement in Group:  Engaged and Supportive  Modes of Intervention:  Discussion and Education  Summary of Progress/Problems:Pt was wanting to leave today he was told last night that his son-in-law had a heart attack and he felt he needed to leave.  He was told he couldn't leave today.  He did not act out or demand to leave.  He did state,"I feel a lot better since I have gotten sober" he went on to say he was glad he was here to help him with his problem.  Jule SerKent, Nancyann Cotterman Gail 04/02/2014, 4:12 PM

## 2014-04-02 NOTE — [facility] Group Notes (Signed)
[facility] Group Notes:  (Clinical Social Work)  04/02/2014     10-11AM  Summary of Progress/Problems:   The main focus of today's process group was for the patient to identify ways in which they have in the past sabotaged their own recovery. Motivational Interviewing and a worksheet were utilized to help patients explore in depth the perceived benefits and costs of their substance use, as well as the potential benefits and costs of stopping.  The Stages of Change were explained using a handout, with an emphasis on making plans to deal with sabotaging behaviors proactively.  The patient expressed that his self-sabotaging behavior is alcohol which he uses to deal with depression and grief.  He talked about having been sober for 7 years, then relapsing after telling himself "one won't hurt."  Type of Therapy:  Group Therapy - Process   Participation Level:  Active  Participation Quality:  Attentive and Sharing  Affect:  Blunted, Depressed and Lethargic  Cognitive:  Oriented  Insight:  Engaged  Engagement in Therapy:  Engaged  Modes of Intervention:  Education, Teacher, English as a foreign languageupport and Processing, Motivational Interviewing  Ambrose MantleMareida Grossman-Orr, LCSW 04/02/2014, 11:23 AM

## 2014-04-02 NOTE — Progress Notes (Signed)
D: Pt presents with flat affect, reports he is "just tired."  Pt in bed at beginning of shift.  Reports his goal was "to stop drinking.  I hope I can still stop after I leave."  Pt denies SI/HI.  Denies AH/VH.  Denies pain. A: Encouraged pt to attend evening group and report any concerns/needs to staff.  [facility] and to notify staff if he needs help ambulating or needs assistance with daily care.  Safety maintained. R: Pt refused to go to group, stating "I'm just too tired."  Refused to do hygiene at HS.  Denies needing assistance and reports "I've been walking without my walker and the nurse earlier said I was walking fine."  Pt is in no acute distress.  Will continue to monitor for safety.

## 2014-04-02 NOTE — Progress Notes (Signed)
Pt has been up and active in the milieu today.  He was asking to be discharged today.  He stated,"my son-in-law had a heart attack last night and I need to be there" He plans to talk with the provider about discharge.  He denies any issues except anxiety that he rated 3 on his self-inventory.  He denies any S/H ideation or A/V/H. He refused to use a cane or walker here although he still appears frail and unsteady.  He stated,"I get around at home just fine without using anything".  Pt did say he had transportation home and plans to do "90 meetings in 90 days".

## 2014-04-03 NOTE — Progress Notes (Signed)
Patient ID: Ralph Jackson, male   DOB: 04-25-1946, 68 y.o.   MRN: [id number] Patient ID: Ralph Jackson, male   DOB: 04-25-1946, 68 y.o.   MRN: [id number] Patient ID: Ralph Jackson, male   DOB: 04-25-1946, 68 y.o.   MRN: [id number] Patient ID: Ralph Jackson, male   DOB: 04-25-1946, 68 y.o.   MRN: [id number] [facility]  04/03/2014 1:55 PM Ralph Jackson  MRN:  [id number]  Subjective: Pt interviewed in his room. Chart reviewed. Ralph Jackson says he is doing well.  Hopes to go home after discharge because he does not really wants go to a treatment center. Thinks that he will be able to abstain from alcohol. And since he is the one that goes out to buy it, he knows not do that any more.   Val EagleRiley Jackson: Ralph Jackson remains frail looking. He is seen spending time at the day room with the other patients. He is participating in groups sessions. He presents no new issues and or complaints.  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 Hypertension   . Urinary tract infection   . ETOH abuse    Axis IV: economic problems, educational problems, housing problems, occupational problems, other psychosocial or environmental problems, problems with access to health care services and problems with primary support group Axis V: 41-50 serious symptoms  ADL's:  Impaired  Sleep: Fair  Appetite:  Good  Suicidal Ideation:  Denies  Homicidal Ideation:  Denies  AEB (as evidenced by):  Psychiatric Specialty Exam: Physical Exam  Constitutional: He is oriented to person, place, and time. He appears well-developed.  Small and mal nourished appearing, patient reports "ive always been a small man"   HENT:  Head: Normocephalic and atraumatic.  Neck: Normal range of motion. Neck supple.  Musculoskeletal: Normal range of motion.  Neurological: He is alert and oriented to person, place, and time.  Skin: Skin is warm and dry.    ROS  Blood pressure 120/75, pulse 70, temperature 97.6 F (36.4 C), temperature source Oral, resp. rate 14, height 5\' 4"  (1.626 m), weight 43.092 kg (95 lb), SpO2 100.00%.Body mass index is 16.3 kg/(m^2).  General Appearance: Disheveled  Eye SolicitorContact::  Fair  Speech:  Garbled  Volume:  Decreased  Mood:  Depressed  Affect:  Flat  Thought Process:  Coherent  Orientation:  Full (Time, Place, and Person)  Thought Content:  Rumination  Suicidal Thoughts:  No  Homicidal Thoughts:  No  Memory:  Immediate;   Fair Recent;   Fair Remote;   Fair  Judgement:  Impaired  Insight:  Lacking  Psychomotor Activity:  Mild tremors  Concentration:  Fair  Recall:  Poor  Fund of Knowledge:Poor  Language: Fair  Akathisia:  Negative  Handed:  Right  AIMS (if indicated):     Assets:  Desire for Improvement Resilience  Sleep:  Number of Hours: 6   Musculoskeletal: Strength & Muscle Tone: decreased Gait & Station: unsteady Patient leans: N/A  Current Medications: Current Facility-Administered Medications  Medication Dose Route Frequency Provider Last Rate Last Dose  . acetaminophen (TYLENOL) tablet 650 mg  650 mg Oral Q6H PRN Audrea MuscatEvanna Cori Burkett, NP   650 mg at 03/31/14 1810  . alum &  mag hydroxide-simeth (MAALOX/MYLANTA) 200-200-20 MG/5ML suspension 30 mL  30 mL Oral Q4H PRN Evanna Janann August, NP      . labetalol (NORMODYNE) tablet 100 mg  100 mg Oral BID Audrea Muscat, NP   100 mg at 04/03/14 0755  . magnesium hydroxide (MILK OF MAGNESIA) suspension 30 mL  30 mL Oral Daily PRN Evanna Janann August, NP      . multivitamin with minerals tablet 1 tablet  1 tablet Oral Daily Evanna Janann August, NP   1 tablet at 04/03/14 0755  . naltrexone (DEPADE) tablet 50 mg   50 mg Oral Daily Sanjuana Kava, NP   50 mg at 04/03/14 0755  . thiamine (VITAMIN B-1) tablet 100 mg  100 mg Oral Daily Evanna Cori Merry Proud, NP   100 mg at 04/03/14 0755  . traZODone (DESYREL) tablet 50 mg  50 mg Oral QHS PRN,MR X 1 Evanna Cori Merry Proud, NP   50 mg at 03/24/14 2216    Lab Results: No results found for this or any previous visit (from the past 48 hour(s)).  Physical Findings: AIMS: Facial and Oral Movements Muscles of Facial Expression: None, normal Lips and Perioral Area: None, normal Jaw: None, normal Tongue: None, normal,Extremity Movements Upper (arms, wrists, hands, fingers): None, normal Lower (legs, knees, ankles, toes): None, normal, Trunk Movements Neck, shoulders, hips: None, normal, Overall Severity Severity of abnormal movements (highest score from questions above): None, normal Incapacitation due to abnormal movements: None, normal Patient's awareness of abnormal movements (rate only patient's report): No Awareness, Dental Status Current problems with teeth and/or dentures?: No Does patient usually wear dentures?: No  CIWA:  CIWA-Ar Total: 1 COWS:  COWS Total Score: 4  Treatment Plan Summary: Daily contact with patient to assess and evaluate symptoms and progress in treatment Medication management  Plan: Supportive approach, relapse prevention. 2. Medication management to reduce current symptoms to base line and improve patient's overall level of functioning, Continue Naltrexone at 50 mg for alcoholism, Trazodone 50 mg Q hs for sleep . 3. Consider SNF placement for physical rehabilitation 4. Continue crisis management and stabilization.   5. Treat health problems as indicated.  Medical Decision Making Problem Points:  Established problem, stable/improving (1) and Review of psycho-social stressors (1) Data Points:  Review of medication regiment & side effects (2) Review of new medications or change in dosage (2)  I certify that inpatient services  furnished can reasonably be expected to improve the patient's condition.   Sanjuana Kava, PMHNP 04/03/2014, 1:55 PM   Patient seen, evaluated and I agree with notes by Nurse Practitioner. Thedore Mins, MD

## 2014-04-03 NOTE — Progress Notes (Signed)
Patient did not attend the evening speaker AA meeting. Pt was encouraged to go to the AA meeting but reported that the nurse had instructed him to remain in bed.  When the nurse went to encourage the pt to attend the meeting, the patient reported not wanting to go. Pt remained in bed during the meeting.

## 2014-04-03 NOTE — Progress Notes (Signed)
D: Patient in bed on first approach.  Patient states he had good day.  Patient states he attended one group tonight.  Patient states when he is dicharged he is going to go to [id number] AA meetings in 90 days Patient denies SI/HI and denies AVH. A: Staff to monitor Q 15 mins for safety.  Encouragement and support offered.  Scheduled medications administered per orders. R: Patient remains safe on the unit.  Patient did not attend group tonight.  Patient not visible on the unit tonight.  Patient administered medications.

## 2014-04-03 NOTE — [facility] Group Notes (Signed)
[facility] Group Notes:  (Clinical Social Work)  04/03/2014  10:00-11:00AM  Summary of Progress/Problems:   The main focus of today's process group was to   identify the patient's current support system and decide on other supports that can be put in place.  The picture on workbook was used to discuss why additional supports are needed.  An emphasis was placed on using counselor, doctor, therapy groups, 12-step groups, and problem-specific support groups to expand supports.   There was also an extensive discussion about what constitutes a healthy support versus an unhealthy support.  The patient expressed full comprehension of the concepts presented, and agreed that there is a need to add more supports.  The patient listened but spoke little, and when asked about his unhealthy supports reported "I don't know if I have any."  When asked about his current supports, he stated he has none.  The group pointed out to him that he in some ways has been and needs to be his own support.  CSW told him to consider adding anything that was on our list on the whiteboard in the "healthy" column, but he was noncommital.  Type of Therapy:  Process Group with Motivational Interviewing  Participation Level:  Minimal  Participation Quality:  Attentive  Affect:  Depressed and Flat  Cognitive:  Lacking  Insight:  Limited  Engagement in Therapy:  Limited  Modes of Intervention:   Education, Support and Processing, Activity  Ambrose MantleMareida Grossman-Orr, LCSW 04/03/2014, 12:15pm

## 2014-04-03 NOTE — Progress Notes (Signed)
Pt has been up and active in the milieu today. He is still refusing to use a cane/walker or w/c he stated,"I told you I don't need that I can walk fine".  He rated both his depression and hopelessness 2 and anxiety a 4 on his self-inventory.  He still plans to do 90 meetings in 90 days. He did state that,"the doctor I seen yesterday told me that he was in a hospital with people taking care of him and I needed to stay here to get better".  He is not asking for discharge today he remains calm and appropriate.

## 2014-04-03 NOTE — Progress Notes (Signed)
Patient lying in bed, eyes closed. Breathing even. Hands and face visible. Will continue to monitor patient.

## 2014-04-03 NOTE — Progress Notes (Signed)
[facility] Group Notes:  (Nursing/MHT/Case Management/Adjunct)  Date:  04/03/2014  Time:  5:12 PM  Type of Therapy:  Psychoeducational Skills  Participation Level:  Did Not Attend  Summary of Progress/Problems: Pt was in bed asleep at the time of group.  Caswell CorwinOwen, Mackinsey Pelland C 04/03/2014, 5:12 PM

## 2014-04-03 NOTE — [facility] Group Notes (Signed)
[facility] Group Notes:  (Nursing/MHT/Case Management/Adjunct)  Date:  04/03/2014  Time:  0900 am  Type of Therapy:  Psychoeducational Skills  Participation Level:  Minimal  Participation Quality:  Attentive  Affect:  Appropriate  Cognitive:  Alert and Appropriate  Insight:  Lacking  Engagement in Group:  Lacking  Modes of Intervention:  Support  Summary of Progress/Problems:   Cranford MonBeaudry, Kimyata Milich Evans 04/03/2014, 11:04 AM

## 2014-04-04 DIAGNOSIS — F1020 Alcohol dependence, uncomplicated: Secondary | ICD-10-CM | POA: Diagnosis present

## 2014-04-04 NOTE — Progress Notes (Signed)
(  D) Patient pleasant and cooperative and appears more alert at this point of his admission. Patient declined to complete daily self inventory, "I may be going home." Patient denies pain or withdrawal symptoms. Pt hygiene remains poor and body odor strong. Patient stated "I'm going back to my place." Patient denied SI/HI or psychosis. (A) writer spent time actively listening and encouraged patient to attend groups today. (R) Patient attended AM discharge planning group. He is ambulating without his walker. He remains on High Fall Risk.

## 2014-04-04 NOTE — Progress Notes (Signed)
D.  Ralph Jackson and cooperative.  Denies SI/HI and denies A/V hallucinations.  Attending groups and interacting with peers on unit. A.  Encouraged pt. To verbalize concerns or issues. R.  None voiced.  Pt. Remains safe.

## 2014-04-04 NOTE — [facility] Group Notes (Signed)
[facility]  04/04/2014 8:45 AM  Participation Quality: Alert, Appropriate and Oriented  Mood/Affect: flat affect; "good" mood  Depression Rating: 5  Anxiety Rating: 2  Thoughts of Suicide: Pt denies SI/HI  Will you contract for safety? Yes  Current AVH: Pt denies  Plan for Discharge/Comments: Pt attended discharge planning group and actively participated in group. CSW provided pt with today's workbook. Pt reports that [facility]; he reports that he will try the 90/90 program with AA.   Transportation Means: Pt reports access to transportation  Supports: No supports mentioned at this time  Chad CordialLauren Carter, LCSWA 04/04/2014 10:31 AM

## 2014-04-04 NOTE — Progress Notes (Signed)
Patient ID: Ralph Jackson, male   DOB: 06/04/1946, 68 y.o.   MRN: [id number] Pt reports that he [facility] is considering [facility], but is instead going to engage in AA's 90/90 program.  CSW contacted [facility] to rescind referral.  Chad CordialLauren Carter, LCSWA 04/04/2014 10:34 AM

## 2014-04-04 NOTE — Progress Notes (Signed)
Adult Psychoeducational Group Note  Date:  04/04/2014 Time:  10:00 am  Group Topic/Focus:  Wellness Toolbox:   The focus of this group is to discuss various aspects of wellness, balancing those aspects and exploring ways to increase the ability to experience wellness.  Patients will create a wellness toolbox for use upon discharge.  Participation Level:  Active  Participation Quality:  Appropriate, Sharing and Supportive  Affect:  Appropriate  Cognitive:  Appropriate  Insight: Appropriate  Engagement in Group:  Engaged  Modes of Intervention:  Discussion, Education, Socialization and Support  Additional Comments:  Pt stated that he needs to stop drinking and eat better to assist him with making progress towards his goal. Pt stated that stupidity has been a barrier to his progress.   Ajeet Casasola 04/04/2014, 1:50 PM

## 2014-04-04 NOTE — Progress Notes (Signed)
[facility]  04/04/2014 6:11 PM Ralph Jackson  MRN:  [id number] Subjective:  States he can maintain sobriety without going to a residential treatment center. States he plans to do the 90/90. He admits he is basically by himself he has a daughter that comes every now and then. States he does not drink with people. He would decided he is going to drink and go to get it. He  Diagnosis:   DSM5: Substance/Addictive Disorders:  Alcohol Related Disorder - Severe (303.90) Depressive Disorders:  Major Depressive Disorder - Mild (296.21) Total Time spent with patient: 30 minutes  Axis I: Substance Induced Mood Disorder  ADL's:  Intact  Sleep: Fair  Appetite:  Fair Psychiatric Specialty Exam: Physical Exam  Review of Systems  Constitutional: Positive for malaise/fatigue.  HENT: Negative.   Eyes: Negative.   Respiratory: Negative.   Cardiovascular: Negative.   Gastrointestinal: Negative.   Genitourinary: Negative.   Musculoskeletal: Negative.   Skin: Negative.   Neurological: Positive for weakness.  Endo/Heme/Allergies: Negative.   Psychiatric/Behavioral: Positive for substance abuse. The patient is nervous/anxious.     Blood pressure 104/68, pulse 75, temperature 97.7 F (36.5 C), temperature source Oral, resp. rate 16, height 5\' 4"  (1.626 m), weight 43.092 kg (95 lb), SpO2 100.00%.Body mass index is 16.3 kg/(m^2).  General Appearance: Disheveled  Eye SolicitorContact::  Fair  Speech:  Clear and Coherent and Slow  Volume:  Decreased  Mood:  Anxious  Affect:  Restricted  Thought Process:  Coherent and Goal Directed  Orientation:  Full (Time, Place, and Person)  Thought Content:  events, symptoms relapse prevention plan  Suicidal Thoughts:  No  Homicidal Thoughts:  No  Memory:  Immediate;   Fair Recent;   Fair Remote;   Fair  Judgement:  Fair  Insight:  Present and Shallow  Psychomotor Activity:  Restlessness  Concentration:  Fair  Recall:  [employer]is[employer]Fair  Fund of Knowledge:NA   Language: Fair  Akathisia:  No  Handed:    AIMS (if indicated):     Assets:  Desire for Improvement  Sleep:  Number of Hours: 6   Musculoskeletal: Strength & Muscle Tone: within normal limits Gait & Station: normal Patient leans: N/A  Current Medications: Current Facility-Administered Medications  Medication Dose Route Frequency Provider Last Rate Last Dose  . acetaminophen (TYLENOL) tablet 650 mg  650 mg Oral Q6H PRN Audrea MuscatEvanna Cori Burkett, NP   650 mg at 03/31/14 1810  . alum & mag hydroxide-simeth (MAALOX/MYLANTA) 200-200-20 MG/5ML suspension 30 mL  30 mL Oral Q4H PRN Evanna Janann Augustori Burkett, NP      . labetalol (NORMODYNE) tablet 100 mg  100 mg Oral BID Audrea MuscatEvanna Cori Burkett, NP   100 mg at 04/04/14 1651  . magnesium hydroxide (MILK OF MAGNESIA) suspension 30 mL  30 mL Oral Daily PRN Evanna Janann Augustori Burkett, NP      . multivitamin with minerals tablet 1 tablet  1 tablet Oral Daily Evanna Janann Augustori Burkett, NP   1 tablet at 04/04/14 0850  . naltrexone (DEPADE) tablet 50 mg  50 mg Oral Daily Sanjuana KavaAgnes I Nwoko, NP   50 mg at 04/04/14 0850  . thiamine (VITAMIN B-1) tablet 100 mg  100 mg Oral Daily Evanna Cori Merry ProudBurkett, NP   100 mg at 04/04/14 0850  . traZODone (DESYREL) tablet 50 mg  50 mg Oral QHS PRN,MR X 1 Evanna Cori Merry ProudBurkett, NP   50 mg at 03/24/14 2216    Lab Results: No results found for this or  any previous visit (from the past 48 hour(s)).  Physical Findings: AIMS: Facial and Oral Movements Muscles of Facial Expression: None, normal Lips and Perioral Area: None, normal Jaw: None, normal Tongue: None, normal,Extremity Movements Upper (arms, wrists, hands, fingers): None, normal Lower (legs, knees, ankles, toes): None, normal, Trunk Movements Neck, shoulders, hips: None, normal, Overall Severity Severity of abnormal movements (highest score from questions above): None, normal Incapacitation due to abnormal movements: None, normal Patient's awareness of abnormal movements (rate only patient's  report): No Awareness, Dental Status Current problems with teeth and/or dentures?: No Does patient usually wear dentures?: No  CIWA:  CIWA-Ar Total: 1 COWS:  COWS Total Score: 4  Treatment Plan Summary: Daily contact with patient to assess and evaluate symptoms and progress in treatment Medication management  Plan: Supportive approach/coping skills/relapse prevention           Reassess and address the co morbidities  Medical Decision Making Problem Points:  Review of psycho-social stressors (1) Data Points:  Review of medication regiment & side effects (2)  I certify that inpatient services furnished can reasonably be expected to improve the patient's condition.   Quetzali Heinle A 04/04/2014, 6:11 PM

## 2014-04-04 NOTE — [facility] Group Notes (Signed)
[facility] LCSW Group Therapy  04/04/2014 1:15pm   Type of Therapy: Group Therapy- Overcoming Obstacles  Participation Level: Active   Participation Quality:  Appropriate  Affect:  Flat   Cognitive: Alert and Oriented   Insight:  Developing/Improving   Engagement in Therapy: Developing/Improving and Engaged   Modes of Intervention: Clarification, Confrontation, Discussion, Education, Exploration, Limit-setting, Orientation, Problem-solving, Rapport Building, Dance movement psychotherapisteality Testing, Socialization and Support   Description of Group:   In this group patients will be encouraged to explore what they see as obstacles to their own wellness and recovery. They will be guided to discuss their thoughts, feelings, and behaviors related to these obstacles. The group will process together ways to cope with barriers, with attention given to specific choices patients can make. Each patient will be challenged to identify changes they are motivated to make in order to overcome their obstacles. This group will be process-oriented, with patients participating in exploration of their own experiences as well as giving and receiving support and challenge from other group members. Pt participated in group discussion, identifying his alcohol use is his biggest obstacle.  Pt had difficulty identifying steps to take in order to achieve sobriety, but was eventually able to identify changing his friend group is important in achieving sobriety.     Therapeutic Modalities:   Cognitive Behavioral Therapy Solution Focused Therapy Motivational Interviewing Relapse Prevention Therapy    Chad CordialLauren Carter, LCSWA 04/04/2014 4:28 PM

## 2014-04-05 DIAGNOSIS — F1020 Alcohol dependence, uncomplicated: Principal | ICD-10-CM

## 2014-04-05 MED ORDER — LABETALOL HCL 100 MG PO TABS: 100.0000 mg | ORAL_TABLET | Freq: Two times a day (BID) | ORAL | Status: DC

## 2014-04-05 MED ORDER — TRAZODONE HCL 50 MG PO TABS: 50.0000 mg | ORAL_TABLET | Freq: Every evening | ORAL | Status: DC | PRN

## 2014-04-05 MED ORDER — NALTREXONE HCL 50 MG PO TABS: 50.0000 mg | ORAL_TABLET | Freq: Every day | ORAL | Status: DC

## 2014-04-05 NOTE — [facility] Group Notes (Signed)
[facility] Group Notes:  (Nursing/MHT/Case Management/Adjunct)  Date:  04/05/2014  Time:  0900 am  Type of Therapy:  Psychoeducational Skills  Participation Level: Active  Participation Quality:  Appropriate and Attentive  Affect:  Appropriate  Cognitive:  Alert and Appropriate  Insight:  Lacking  Engagement in Group:  Off Topic  Modes of Intervention:  Support  Summary of Progress/Problems: Patient states that he will attend AA meeting and get a sponsor upon discharge.  Cranford MonBeaudry, Jonea Bukowski Evans 04/05/2014, 2:00 PM

## 2014-04-05 NOTE — Progress Notes (Signed)
[facility] Discharge Plan :  Will you be returning to the same living situation after discharge: Yes,  patient can return to his boarding house At discharge, do you have transportation home?:Yes,  patient states that he will take the bus home Do you have the ability to pay for your medications:Yes,  patient will be provided with any necessary medication samples and prescriptions at discharge.  Release of information consent forms completed and in the chart;  Patient's signature needed at discharge.  Patient to Follow up at: Follow-up Information   Follow up with AA. (Please see packet of local meetings provided at discharge.)    Contact information:   See List of Local Meetings      Patient denies SI/HI:   Yes,  denies    Safety Planning and Suicide Prevention discussed:  Yes,  with patient  Dorice Stiggers, [place] CarboKristin L 04/05/2014, 11:27 AM

## 2014-04-05 NOTE — Tx Team (Signed)
Interdisciplinary Treatment Plan Update (Adult) Date: 04/05/2014   Time Reviewed: 12:54 PM   Progress in Treatment: Attending groups: Yes Participating in groups: Yes Taking medication as prescribed: Yes Tolerating medication: Yes Family/Significant other contact made: CSW assessing Patient understands diagnosis: Yes Discussing patient identified problems/goals with staff: Yes Medical problems stabilized or resolved: Yes Denies suicidal/homicidal ideation: Yes Issues/concerns per patient self-inventory: Yes Other:  New problem(s) identified: N/A  Discharge Plan or Barriers: Patient to discharge back home and plans to take the bus home. He desires to follow up with local AA meetings.  Reason for Continuation of Hospitalization: Anxiety Depression Medication Stabilization  Comments: N/A  Estimated length of stay: D/C TODAY  For review of initial/current patient goals, please see plan of care.  Attendees: Patient:    Family:    Physician: Dr. Dub MikesLugo 04/05/2014  12:54 PM  Nursing:  04/05/2014  12:54 PM   Clinical Social Worker: Belenda CruiseKristin Dayana Dalporto, LCSWA 04/05/2014  12:54 PM         Other: Serena ColonelAggie Nwoko, NP 04/05/2014  12:54 PM   Other:    Other:    Other:    Other:    Other:    Other:    Other:     Scribe for Treatment Team:  Samuella BruinKristin Kiptyn Rafuse, MSW, [employer]CSWA [phone]

## 2014-04-05 NOTE — Progress Notes (Signed)
Patient ID: Ralph Jackson, male   DOB: Feb 11, 1946, 68 y.o.   MRN: [id number] 1450: he has been discharged home and was going to ride the bus home. He voiced understanding of how to get to the bus stop that he went their the last time he was here. He insisted that he could get there. He voiced understanding of discharge instruction and of the follow up plan. He denied thoughts of SI and all his belongings were taken home with him.

## 2014-04-05 NOTE — Clinical Social Work Note (Signed)
Patient declining aftercare follow up appointment but agreeable to attending AA meetings. Patient provided with local listing of meetings.   Ralph BruinKristin Lorinda Copland, MSW, [employer]LCSWA Clinical Social Worker [facility] [phone]

## 2014-04-05 NOTE — [facility] Suicide Risk Assessment (Signed)
Suicide Risk Assessment  Discharge Assessment     Demographic Factors:  Male, Age 68 or older and Caucasian  Total Time spent with patient: 30 minutes  Psychiatric Specialty Exam:     Blood pressure 127/82, pulse 74, temperature 97.7 F (36.5 C), temperature source Oral, resp. rate 16, height 5\' 4"  (1.626 m), weight 43.092 kg (95 lb), SpO2 100.00%.Body mass index is 16.3 kg/(m^2).  General Appearance: Fairly Groomed  Patent attorneyye Contact::  Fair  Speech:  Clear and Coherent  Volume:  Normal  Mood:  Euthymic  Affect:  Appropriate  Thought Process:  Coherent and Goal Directed  Orientation:  Full (Time, Place, and Person)  Thought Content:  plans as he moves on, relapse prevention plan  Suicidal Thoughts:  No  Homicidal Thoughts:  No  Memory:  Immediate;   Fair Recent;   Fair Remote;   Fair  Judgement:  Fair  Insight:  Present  Psychomotor Activity:  Normal  Concentration:  Fair  Recall:  [employer]Fair  Fund of Knowledge:NA  Language: Fair  Akathisia:  No  Handed:  Right  AIMS (if indicated):     Assets:  Desire for Improvement  Sleep:  Number of Hours: 6.5    Musculoskeletal: Strength & Muscle Tone: within normal limits Gait & Station: normal Patient leans: N/A   Mental Status Per Nursing Assessment::   On Admission:     Current Mental Status by Physician: In full contact with reality. There are no active S/S of withdrawal, no SI plans or intent. He states he is committed to abstinence as he has been drinking for "40 years:" and knows it is affecting his health   Loss Factors: NA  Historical Factors: NA  Risk Reduction Factors:   NA  Continued Clinical Symptoms:  Alcohol/Substance Abuse/Dependencies  Cognitive Features That Contribute To Risk:  Closed-mindedness Polarized thinking Thought constriction (tunnel vision)    Suicide Risk:  Minimal: No identifiable suicidal ideation.  Patients presenting with no risk factors but with morbid ruminations; may be classified  as minimal risk based on the severity of the depressive symptoms  Discharge Diagnoses:   AXIS I:  Alcohol Dependence, S/P alcohol withdrawal AXIS II:  No diagnosis AXIS III:   Past Medical History  Diagnosis Date  . Hypertension   . Urinary tract infection   . ETOH abuse    AXIS IV:  other psychosocial or environmental problems AXIS V:  61-70 mild symptoms  Plan Of Care/Follow-up recommendations:  Activity:  as tolerated Diet:  regular Follow up outpatient basis/AA Is patient on multiple antipsychotic therapies at discharge:  No   Has Patient had three or more failed trials of antipsychotic monotherapy by history:  No  Recommended Plan for Multiple Antipsychotic Therapies: NA    Laquana Villari A 04/05/2014, 8:43 AM

## 2014-04-05 NOTE — Progress Notes (Signed)
Patient ID: Ralph Jackson, male   DOB: 1945-12-07, 68 y.o.   MRN: [id number] He has been up and about interacting more with peers and staff. Has attended groups. Self inventory : depression 3. Hopelessness 0, anxiety 1, denies withdrawal symptoms and SI thoughts. Has no c/o pain. His goal is to be off alcohol, by staying dry. His plan is to return to where he was living.

## 2014-04-05 NOTE — [facility] Group Notes (Signed)
[facility] LCSW Group Therapy 04/05/2014 1:15 PM  Type of Therapy: Group Therapy  Participation Level: Active  Participation Quality: Attentive, Sharing and Supportive  Affect: Depressed and Flat  Cognitive: Alert and Oriented  Insight: Developing/Improving and Engaged  Engagement in Therapy: Developing/Improving and Engaged Modes of Intervention: Clarification, Confrontation, Discussion, Education, Exploration, Limit-setting, Orientation, Problem-solving, Rapport Building, Reality Testing, Socialization and Support  Summary of Progress/Problems: Patient actively listened to [employer] guest speaker.   Ralph Jackson, MSW, LCSWA Clinical Social Worker [facility] [phone]   

## 2014-04-05 NOTE — Discharge Summary (Signed)
Physician Discharge Summary Note  Patient:  Ralph Jackson is an 67 y.o., male MRN:  [id number] DOB:  1945/09/13 Patient phone:  [phone] (home)  Patient address:   [address] [name] [address],  Total Time spent with patient: Greater than 30 minutes  Date of Admission:  03/24/2014 Date of Discharge: 04/05/14  Reason for Admission: Alcohol detox  Discharge Diagnoses: Active Problems:   Alcohol dependence   Psychiatric Specialty Exam: Physical Exam  Constitutional: He is oriented to person, place, and time. He appears well-developed.  HENT:  Head: Normocephalic.  Eyes: Pupils are equal, round, and reactive to light.  Neck: Normal range of motion.  Cardiovascular: Normal rate.   Respiratory: Effort normal.  GI: Soft.  Genitourinary:  Has urinary incontinence problems.  Musculoskeletal: Normal range of motion.  Neurological: He is alert and oriented to person, place, and time.  Skin: Skin is warm and dry.    Review of Systems  Constitutional: Negative.   HENT: Negative.   Eyes: Negative.   Respiratory: Negative.   Cardiovascular: Negative.   Gastrointestinal: Negative.   Genitourinary: Negative.   Musculoskeletal: Negative.   Skin: Negative.   Neurological: Negative.   Endo/Heme/Allergies: Negative.   Psychiatric/Behavioral: Positive for substance abuse (Alcoholism, chronic). Negative for depression, suicidal ideas, hallucinations and memory loss. The patient has insomnia (Stable). The patient is not nervous/anxious.     Blood pressure 127/82, pulse 74, temperature 97.7 F (36.5 C), temperature source Oral, resp. rate 16, height 5\' 4"  (1.626 m), weight 43.092 kg (95 lb), SpO2 100.00%.Body mass index is 16.3 kg/(m^2).   General Appearance: Fairly Groomed   Patent attorney:: Fair   Speech: Clear and Coherent   Volume: Normal   Mood: Euthymic   Affect: Appropriate   Thought Process: Coherent and Goal Directed   Orientation: Full (Time, Place, and  Person)   Thought Content: plans as he moves on, relapse prevention plan   Suicidal Thoughts: No   Homicidal Thoughts: No   Memory: Immediate; Fair  Recent; Fair  Remote; Fair   Judgement: Fair   Insight: Present   Psychomotor Activity: Normal   Concentration: Fair   Recall: [employer] of Knowledge:NA   Language: Fair   Akathisia: No   Handed: Right   AIMS (if indicated):   Assets: Desire for Improvement   Sleep: Number of Hours: 6.5    Past Psychiatric History: Diagnosis: Alcohol dependence  Hospitalizations: [facility] adult unit  Outpatient Care: [facility]  Substance Abuse Care: [facility] referral  Self-Mutilation: NA  Suicidal Attempts: NA  Violent Behaviors: NA   Musculoskeletal: Strength & Muscle Tone: within normal limits Gait & Station: normal Patient leans: N/A  DSM5: Schizophrenia Disorders:  NA Obsessive-Compulsive Disorders:  NA Trauma-Stressor Disorders:  NA Substance/Addictive Disorders:  Alcohol Related Disorder - Severe (303.90) Depressive Disorders:  NA  Axis Diagnosis:   AXIS I:  Alcohol dependence AXIS II:  Deferred AXIS III:   Past Medical History  Diagnosis Date  . Hypertension   . Urinary tract infection   . ETOH abuse    AXIS IV:  other psychosocial or environmental problems and Alcoholism, chronic AXIS V:  62  Level of Care:  OP  Hospital Course:  Patient states today during his psychiatric admission assessment "I'm here for alcohol abuse. I drink a whole bunch every day. It's been going on a long time. I guess it's a habit. A friend got concerned about me. I almost passed out. I think I will die if I  keep this up. I want to go to a rehab when I leave here."   Mr. Ralph RussellLivengood was admitted to the hospital with a blood alcohol level of 309 per toxicology tests reports. He was weak, frail and intoxicated, requiring detoxification treatments to re-stabilize his systems of alcohol intoxication. This is achieved using librium detoxification treatment  protocols. In the early part of his stay in this hospital, Mr. Ralph RussellLivengood appeared very weak, sluggish, incontinent and presented a high fall risks. He was placed on a 1:1 supervision and his room was made a 'do not admit' for other patients because he was urinating on himself and the floor. These problems eventually resolved where he started to participate in group counseling sessions and AA/NA meetings being offered and held on this unit. He learned coping skills.  Besides the detox treatments, Ralph LamDouglas was also medicated and discharged on Naltrexone 50 mg tablet daily for alcohol dependence and Trazodone 50 mg Q bedtime for sleep. He also received other pertinent home medications for his other pre-existing medical conditions. He tolerated his treatment regimen without any adverse effects. Patient also learnt this past weekend that his son had a heart attack and was admitted to the hopsital. Mr. Ralph HandyLivinggood did cope well with the bad news.  Ralph LamDouglas has completed detoxification treatments and his mood is stable. This is evidenced by his reports of improved mood and absence of substance withdrawal symptoms. He is currently being discharged to his residence and will be participating in the AA/NA programs being held within his community. He is provided with all the pertinent information required to make contact with the AA without any problems. Upon discharge, he adamantly denies any SIHI, AVH, delusional thoughts, paranoia and or withdrawal symptoms. [facility] 4 days worth, supply [facility]. [facility] in no apparent distress. Transportation per city bus. [facility] provided bus ticket   Consults:  psychiatry  Significant Diagnostic Studies:  labs: CBC with diff, CMP, UDS, toxicology tests, U/A  Discharge Vitals:   Blood pressure 127/82, pulse 74, temperature 97.7 F (36.5 C), temperature source Oral, resp. rate 16, height 5\' 4"   (1.626 m), weight 43.092 kg (95 lb), SpO2 100.00%. Body mass index is 16.3 kg/(m^2). Lab Results:   No results found for this or any previous visit (from the past 72 hour(s)).  Physical Findings: AIMS: Facial and Oral Movements Muscles of Facial Expression: None, normal Lips and Perioral Area: None, normal Jaw: None, normal Tongue: None, normal,Extremity Movements Upper (arms, wrists, hands, fingers): None, normal Lower (legs, knees, ankles, toes): None, normal, Trunk Movements Neck, shoulders, hips: None, normal, Overall Severity Severity of abnormal movements (highest score from questions above): None, normal Incapacitation due to abnormal movements: None, normal Patient's awareness of abnormal movements (rate only patient's report): No Awareness, Dental Status Current problems with teeth and/or dentures?: No Does patient usually wear dentures?: No  CIWA:  CIWA-Ar Total: 1 COWS:  COWS Total Score: 4  Psychiatric Specialty Exam: See Psychiatric Specialty Exam and Suicide Risk Assessment completed by Attending Physician prior to discharge.  Discharge destination:  Home  Is patient on multiple antipsychotic therapies at discharge:  No   Has Patient had three or more failed trials of antipsychotic monotherapy by history:  No  Recommended Plan for Multiple Antipsychotic Therapies: NA    Medication List       Indication   labetalol 100 MG tablet  Commonly known as:  NORMODYNE  Take  1 tablet (100 mg total) by mouth 2 (two) times daily. For heart condition   Indication:  Chronic Angina Pectoris, High Blood Pressure     naltrexone 50 MG tablet  Commonly known as:  DEPADE  Take 1 tablet (50 mg total) by mouth daily. For alcoholism   Indication:  Excessive Use of Alcohol     traZODone 50 MG tablet  Commonly known as:  DESYREL  Take 1 tablet (50 mg total) by mouth at bedtime as needed and may repeat dose one time if needed for sleep.   Indication:  Trouble Sleeping        Follow-up Information   Follow up with AA. (Please see packet of local meetings provided at discharge.)    Contact information:   See List of Local Meetings     Follow-up recommendations:  Activity:  As tolerated Diet: As recommended by your primary care doctor. Keep all scheduled follow-up appointments as recommended.  Comments:  Take all your medications as prescribed by your mental healthcare provider. Report any adverse effects and or reactions from your medicines to your outpatient provider promptly. Patient is instructed and cautioned to not engage in alcohol and or illegal drug use while on prescription medicines. In the event of worsening symptoms, patient is instructed to call the crisis hotline, 911 and or go to the nearest ED for appropriate evaluation and treatment of symptoms. Follow-up with your primary care provider for your other medical issues, concerns and or health care needs.   Total Discharge Time:  Greater than 30 minutes.  Signed: Sanjuana Kava, PMHNP 04/05/2014, 2:32 PM I personally assessed the patient and formulated the plan Madie Reno A. Dub Mikes, M.D.

## 2014-04-07 NOTE — Progress Notes (Signed)
Patient Discharge Instructions:  No documentation was faxed for HBIPS.  Per the sw the patient refused follow up.    Jerelene ReddenSheena E Staples, 04/07/2014, 3:23 PM

## 2014-04-20 ENCOUNTER — Encounter (HOSPITAL_COMMUNITY): Payer: Self-pay | Admitting: Emergency Medicine

## 2014-04-20 ENCOUNTER — Emergency Department (HOSPITAL_COMMUNITY)
Admission: EM | Admit: 2014-04-20 | Discharge: 2014-04-21 | Disposition: A | Discharge status: Other Acute Inpatient Hospital | Payer: Medicare Other | Source: Home / Self Care | Attending: Emergency Medicine | Admitting: Emergency Medicine

## 2014-04-20 DIAGNOSIS — Z681 Body mass index (BMI) 19 or less, adult: Secondary | ICD-10-CM | POA: Insufficient documentation

## 2014-04-20 DIAGNOSIS — F1010 Alcohol abuse, uncomplicated: Secondary | ICD-10-CM | POA: Insufficient documentation

## 2014-04-20 DIAGNOSIS — Z87440 Personal history of urinary (tract) infections: Secondary | ICD-10-CM | POA: Insufficient documentation

## 2014-04-20 DIAGNOSIS — E46 Unspecified protein-calorie malnutrition: Secondary | ICD-10-CM | POA: Insufficient documentation

## 2014-04-20 DIAGNOSIS — F1020 Alcohol dependence, uncomplicated: Secondary | ICD-10-CM | POA: Diagnosis present

## 2014-04-20 DIAGNOSIS — I10 Essential (primary) hypertension: Secondary | ICD-10-CM

## 2014-04-20 DIAGNOSIS — F1310 Sedative, hypnotic or anxiolytic abuse, uncomplicated: Secondary | ICD-10-CM

## 2014-04-20 DIAGNOSIS — F10229 Alcohol dependence with intoxication, unspecified: Secondary | ICD-10-CM | POA: Diagnosis not present

## 2014-04-20 LAB — CBC
HCT: 40 % (ref 39.0–52.0)
Hemoglobin: 14 g/dL (ref 13.0–17.0)
MCH: 32.5 pg (ref 26.0–34.0)
MCHC: 35 g/dL (ref 30.0–36.0)
MCV: 92.8 fL (ref 78.0–100.0)
Platelets: 199 10*3/uL (ref 150–400)
RBC: 4.31 MIL/uL (ref 4.22–5.81)
RDW: 15 % (ref 11.5–15.5)
WBC: 6.9 10*3/uL (ref 4.0–10.5)

## 2014-04-20 LAB — URINALYSIS, ROUTINE W REFLEX MICROSCOPIC
Bilirubin Urine: NEGATIVE
Glucose, UA: NEGATIVE mg/dL
HGB URINE DIPSTICK: NEGATIVE
Ketones, ur: NEGATIVE mg/dL
LEUKOCYTES UA: NEGATIVE
Nitrite: NEGATIVE
PROTEIN: NEGATIVE mg/dL
Specific Gravity, Urine: 1.009 (ref 1.005–1.030)
Urobilinogen, UA: 0.2 mg/dL (ref 0.0–1.0)
pH: 5.5 (ref 5.0–8.0)

## 2014-04-20 LAB — RAPID URINE DRUG SCREEN, HOSP PERFORMED
Amphetamines: NOT DETECTED
Barbiturates: NOT DETECTED
Benzodiazepines: POSITIVE — AB
Cocaine: NOT DETECTED
Opiates: NOT DETECTED
Tetrahydrocannabinol: NOT DETECTED

## 2014-04-20 LAB — ETHANOL: Alcohol, Ethyl (B): 170 mg/dL — ABNORMAL HIGH (ref 0–11)

## 2014-04-20 LAB — COMPREHENSIVE METABOLIC PANEL
ALT: 28 U/L (ref 0–53)
AST: 41 U/L — ABNORMAL HIGH (ref 0–37)
Albumin: 3.8 g/dL (ref 3.5–5.2)
Alkaline Phosphatase: 166 U/L — ABNORMAL HIGH (ref 39–117)
Anion gap: 19 — ABNORMAL HIGH (ref 5–15)
BUN: 8 mg/dL (ref 6–23)
CO2: 25 mEq/L (ref 19–32)
Calcium: 9.2 mg/dL (ref 8.4–10.5)
Chloride: 91 mEq/L — ABNORMAL LOW (ref 96–112)
Creatinine, Ser: 0.8 mg/dL (ref 0.50–1.35)
GFR calc Af Amer: >90 mL/min (ref >90)
GFR calc non Af Amer: >90 mL/min (ref >90)
Glucose, Bld: 107 mg/dL — ABNORMAL HIGH (ref 70–99)
Potassium: 3.7 mEq/L (ref 3.7–5.3)
Sodium: 135 mEq/L — ABNORMAL LOW (ref 137–147)
Total Bilirubin: 0.7 mg/dL (ref 0.3–1.2)
Total Protein: 7.7 g/dL (ref 6.0–8.3)

## 2014-04-20 LAB — BASIC METABOLIC PANEL
Anion gap: 13 (ref 5–15)
BUN: 8 mg/dL (ref 6–23)
CALCIUM: 7.9 mg/dL — AB (ref 8.4–10.5)
CO2: 26 mEq/L (ref 19–32)
Chloride: 99 mEq/L (ref 96–112)
Creatinine, Ser: 0.85 mg/dL (ref 0.50–1.35)
GFR calc Af Amer: >90 mL/min (ref >90)
GFR, EST NON AFRICAN AMERICAN: 88 mL/min — AB (ref >90)
Glucose, Bld: 90 mg/dL (ref 70–99)
Potassium: 3.3 mEq/L — ABNORMAL LOW (ref 3.7–5.3)
SODIUM: 138 meq/L (ref 137–147)

## 2014-04-20 LAB — SALICYLATE LEVEL: Salicylate Lvl: <2 mg/dL — ABNORMAL LOW (ref 2.8–20.0)

## 2014-04-20 LAB — ACETAMINOPHEN LEVEL: Acetaminophen (Tylenol), Serum: <15 ug/mL (ref 10–30)

## 2014-04-20 MED ORDER — POTASSIUM CHLORIDE CRYS ER 20 MEQ PO TBCR
40.0000 meq | EXTENDED_RELEASE_TABLET | Freq: Once | ORAL | Status: AC
  Administered 2014-04-20: 40 meq via ORAL
  Filled 2014-04-20: qty 2

## 2014-04-20 MED ORDER — ONDANSETRON HCL 4 MG PO TABS: 4.0000 mg | ORAL_TABLET | Freq: Three times a day (TID) | ORAL | Status: DC | PRN

## 2014-04-20 MED ORDER — LORAZEPAM 1 MG PO TABS
0.0000 mg | ORAL_TABLET | Freq: Four times a day (QID) | ORAL | Status: DC
  Administered 2014-04-21: 1 mg via ORAL
  Filled 2014-04-20: qty 1

## 2014-04-20 MED ORDER — VITAMIN B-1 100 MG PO TABS
100.0000 mg | ORAL_TABLET | Freq: Every day | ORAL | Status: DC
  Administered 2014-04-20: 100 mg via ORAL
  Filled 2014-04-20: qty 1

## 2014-04-20 MED ORDER — ALUM & MAG HYDROXIDE-SIMETH 200-200-20 MG/5ML PO SUSP: 30.0000 mL | ORAL | Status: DC | PRN

## 2014-04-20 MED ORDER — LORAZEPAM 1 MG PO TABS
0.0000 mg | ORAL_TABLET | Freq: Two times a day (BID) | ORAL | Status: DC
  Administered 2014-04-21: 1 mg via ORAL
  Filled 2014-04-20: qty 1

## 2014-04-20 MED ORDER — THIAMINE HCL 100 MG/ML IJ SOLN: 100.0000 mg | Freq: Every day | INTRAMUSCULAR | Status: DC

## 2014-04-20 MED ORDER — SODIUM CHLORIDE 0.9 % IV BOLUS (SEPSIS)
1000.0000 mL | Freq: Once | INTRAVENOUS | Status: AC
  Administered 2014-04-20: 1000 mL via INTRAVENOUS

## 2014-04-20 NOTE — ED Notes (Addendum)
Pt  Had black pants shoes and t-shirt all placed in personal belongings bag, Pt also stated he had money but didn;t want staff going through his pants so pt placed his pants in bag his self . Pt was not IVC at this time. Pt belongings bag was passed of to [facility] Unit staff by security.  Rn notifited

## 2014-04-20 NOTE — ED Notes (Signed)
Patient assessed and oriented to unit. Unit rules and TV guide giving to patient.

## 2014-04-20 NOTE — ED Provider Notes (Signed)
[name]: [id number]     Arrival date & time 04/20/14  1404 History   First MD Initiated Contact with Patient 04/20/14 1511     Chief Complaint  Patient presents with  . detox      (Consider location/radiation/quality/duration/timing/severity/associated sxs/prior Treatment) The history is provided by the patient. No language interpreter was used.  Ralph Jackson is a 68 y/o M with PMHx of HTN, ETOH abuse presenting to the ED with the request of detox from alcohol. Patient reported that he has been drinking alcohol for approximately 40 years. Reported that he drinks approximately 1/5 of wine per day - stated that his last drink of wine was at 11:00AM this morning. Patient reported that he was recently discharged [facility] July of [facility] - stated that he relapsed 2 weeks ago. Patient stated that his uncle was an alcoholic and got him into alcohol. Denied heroin, cocaine, marijuana, cigarette use. Falls, injury, blurred vision, sudden loss of vision, chest pain, shortness of breath, difficulty breathing, nausea, vomiting, diarrhea, shakes, tremors, AVH, urinary symptoms, suicidal ideation, homicidal ideation, weakness, diaphoresis. PCP none   Past Medical History  Diagnosis Date  . Hypertension   . Urinary tract infection   . ETOH abuse    History reviewed. No pertinent past surgical history. History reviewed. No pertinent family history. History  Substance Use Topics  . Smoking status: Never Smoker   . Smokeless tobacco: Current User  . Alcohol Use: Yes     Comment: Drinks 1/5 and 2-24oz beers, daily     Review of Systems  Constitutional: Negative for fever and chills.  Respiratory: Negative for chest tightness and shortness of breath.   Cardiovascular: Negative for chest pain.  Gastrointestinal: Negative for nausea, vomiting, abdominal pain and diarrhea.  Genitourinary: Negative for hematuria and decreased urine volume.  Neurological: Negative for dizziness, weakness and numbness.   Psychiatric/Behavioral: Negative for suicidal ideas, hallucinations, confusion, dysphoric mood and decreased concentration. The patient is not nervous/anxious.       Allergies  Review of patient's allergies indicates no known allergies.  Home Medications   Prior to Admission medications   Not on File   BP 136/88  Pulse 76  Temp(Src) 98.1 F (36.7 C) (Oral)  Resp 18  SpO2 98% Physical Exam  Nursing note and vitals reviewed. Constitutional: He is oriented to person, place, and time.  Frail appearing, thin male. Chronically ill appearing. Disheveled appearing, unclean clothing  HENT:  Head: Normocephalic and atraumatic.  Mouth/Throat: Oropharynx is clear and moist. No oropharyngeal exudate.  Eyes: Conjunctivae and EOM are normal. Pupils are equal, round, and reactive to light. Right eye exhibits no discharge. Left eye exhibits no discharge.  Neck: Normal range of motion. Neck supple. No tracheal deviation present.  Cardiovascular: Normal rate, regular rhythm and normal heart sounds.  Exam reveals no friction rub.   No murmur heard. Pulmonary/Chest: Effort normal and breath sounds normal. No respiratory distress. He has no wheezes. He has no rales.  Abdominal: Soft. Bowel sounds are normal. He exhibits no distension. There is no tenderness. There is no rebound and no guarding.  Musculoskeletal: Normal range of motion. He exhibits no edema and no tenderness.  Full ROM to upper and lower extremities without difficulty noted, negative ataxia noted.  Lymphadenopathy:    He has no cervical adenopathy.  Neurological: He is alert and oriented to person, place, and time. No cranial nerve deficit. He exhibits normal muscle tone. Coordination normal.  Cranial nerves III-XII grossly intact Strength  5+/5+ to upper and lower extremities bilaterally with resistance applied, equal distribution noted Equal grip strength Negative facial drooping Negative slurred speech  Negative aphasia GCS  15 Patient follows commands well Patient able to answer questions appropriately  Negative asterixis Negative arm drift Fine motor skills intact Heel to knee down shin normal bilaterally Gait proper, proper balance - negative sway, negative drift, negative step-offs  Skin: Skin is warm and dry. No rash noted. He is not diaphoretic. No erythema.  Psychiatric:  Flat affect  Poor eye contact  Goal-oriented    ED Course  Procedures (including critical care time)  Results for orders placed during the hospital encounter of 04/20/14  ACETAMINOPHEN LEVEL      Result Value Ref Range   Acetaminophen (Tylenol), Serum <15.0  10 - 30 ug/mL  CBC      Result Value Ref Range   WBC 6.9  4.0 - 10.5 K/uL   RBC 4.31  4.22 - 5.81 MIL/uL   Hemoglobin 14.0  13.0 - 17.0 g/dL   HCT 69.640.0  29.539.0 - 28.452.0 %   MCV 92.8  78.0 - 100.0 fL   MCH 32.5  26.0 - 34.0 pg   MCHC 35.0  30.0 - 36.0 g/dL   RDW 13.215.0  44.011.5 - 10.215.5 %   Platelets 199  150 - 400 K/uL  COMPREHENSIVE METABOLIC PANEL      Result Value Ref Range   Sodium 135 (*) 137 - 147 mEq/L   Potassium 3.7  3.7 - 5.3 mEq/L   Chloride 91 (*) 96 - 112 mEq/L   CO2 25  19 - 32 mEq/L   Glucose, Bld 107 (*) 70 - 99 mg/dL   BUN 8  6 - 23 mg/dL   Creatinine, Ser 7.250.80  0.50 - 1.35 mg/dL   Calcium 9.2  8.4 - 36.610.5 mg/dL   Total Protein 7.7  6.0 - 8.3 g/dL   Albumin 3.8  3.5 - 5.2 g/dL   AST 41 (*) 0 - 37 U/L   ALT 28  0 - 53 U/L   Alkaline Phosphatase 166 (*) 39 - 117 U/L   Total Bilirubin 0.7  0.3 - 1.2 mg/dL   GFR calc non Af Amer >90  >90 mL/min   GFR calc Af Amer >90  >90 mL/min   Anion gap 19 (*) 5 - 15  ETHANOL      Result Value Ref Range   Alcohol, Ethyl (B) 170 (*) 0 - 11 mg/dL  SALICYLATE LEVEL      Result Value Ref Range   Salicylate Lvl <2.0 (*) 2.8 - 20.0 mg/dL  URINE RAPID DRUG SCREEN (HOSP PERFORMED)      Result Value Ref Range   Opiates NONE DETECTED  NONE DETECTED   Cocaine NONE DETECTED  NONE DETECTED   Benzodiazepines POSITIVE (*)  NONE DETECTED   Amphetamines NONE DETECTED  NONE DETECTED   Tetrahydrocannabinol NONE DETECTED  NONE DETECTED   Barbiturates NONE DETECTED  NONE DETECTED  URINALYSIS, ROUTINE W REFLEX MICROSCOPIC      Result Value Ref Range   Color, Urine YELLOW  YELLOW   APPearance CLOUDY (*) CLEAR   Specific Gravity, Urine 1.009  1.005 - 1.030   pH 5.5  5.0 - 8.0   Glucose, UA NEGATIVE  NEGATIVE mg/dL   Hgb urine dipstick NEGATIVE  NEGATIVE   Bilirubin Urine NEGATIVE  NEGATIVE   Ketones, ur NEGATIVE  NEGATIVE mg/dL   Protein, ur NEGATIVE  NEGATIVE mg/dL   Urobilinogen,  UA 0.2  0.0 - 1.0 mg/dL   Nitrite NEGATIVE  NEGATIVE   Leukocytes, UA NEGATIVE  NEGATIVE  BASIC METABOLIC PANEL      Result Value Ref Range   Sodium 138  137 - 147 mEq/L   Potassium 3.3 (*) 3.7 - 5.3 mEq/L   Chloride 99  96 - 112 mEq/L   CO2 26  19 - 32 mEq/L   Glucose, Bld 90  70 - 99 mg/dL   BUN 8  6 - 23 mg/dL   Creatinine, Ser 1.61  0.50 - 1.35 mg/dL   Calcium 7.9 (*) 8.4 - 10.5 mg/dL   GFR calc non Af Amer 88 (*) >90 mL/min   GFR calc Af Amer >90  >90 mL/min   Anion gap 13  5 - 15    Labs Review Labs Reviewed  COMPREHENSIVE METABOLIC PANEL - Abnormal; Notable for the following:    Sodium 135 (*)    Chloride 91 (*)    Glucose, Bld 107 (*)    AST 41 (*)    Alkaline Phosphatase 166 (*)    Anion gap 19 (*)    All other components within normal limits  ETHANOL - Abnormal; Notable for the following:    Alcohol, Ethyl (B) 170 (*)    All other components within normal limits  SALICYLATE LEVEL - Abnormal; Notable for the following:    Salicylate Lvl <2.0 (*)    All other components within normal limits  URINE RAPID DRUG SCREEN (HOSP PERFORMED) - Abnormal; Notable for the following:    Benzodiazepines POSITIVE (*)    All other components within normal limits  URINALYSIS, ROUTINE W REFLEX MICROSCOPIC - Abnormal; Notable for the following:    APPearance CLOUDY (*)    All other components within normal limits  BASIC  METABOLIC PANEL - Abnormal; Notable for the following:    Potassium 3.3 (*)    Calcium 7.9 (*)    GFR calc non Af Amer 88 (*)    All other components within normal limits  ACETAMINOPHEN LEVEL  CBC    Imaging Review No results found.   EKG Interpretation None      MDM   Final diagnoses:  Alcohol abuse    Medications  LORazepam (ATIVAN) tablet 0-4 mg (1 mg Oral Given 04/21/14 0053)  LORazepam (ATIVAN) tablet 0-4 mg (0 mg Oral Not Given 04/20/14 1619)  thiamine (VITAMIN B-1) tablet 100 mg (100 mg Oral Given 04/20/14 1619)  thiamine (B-1) injection 100 mg (0 mg Intravenous Duplicate 04/20/14 1619)  ondansetron (ZOFRAN) tablet 4 mg (not administered)  alum & mag hydroxide-simeth (MAALOX/MYLANTA) 200-200-20 MG/5ML suspension 30 mL (not administered)  sodium chloride 0.9 % bolus 1,000 mL (0 mLs Intravenous Stopped 04/20/14 1813)  potassium chloride SA (K-DUR,KLOR-CON) CR tablet 40 mEq (40 mEq Oral Given 04/20/14 2006)   Filed Vitals:   04/20/14 1935 04/20/14 2209 04/21/14 0051 04/21/14 0052  BP: 151/84 134/84 136/88 136/88  Pulse: 90 91 76 76  Temp: 97.9 F (36.6 C) 98.1 F (36.7 C) 98.1 F (36.7 C)   TempSrc: Oral Oral Oral   Resp: 16 18 18    SpO2: 97% 98% 98%    This provider reviewed patient's chart. Patient was last seen and assessed in ED setting on 03/23/2014 regarding request for alcohol detox-patient [facility]. CBC unremarkable. CMP noted mildly elevated AST of 41 alkaline phosphatase 166 - 1 compared to previous labs patient's alkaline phosphatase has always been elevated. Anion gap of  19-suspicion to be due to dehydration. Ethanol elevated at 170. Acetaminophen and salicylate level negative elevation. Urinalysis unremarkable-urine drug screen positive for benzos. Patient placed on CIWA protocol. IV fluids administered to decreased anion gap.   7:02 PM Repeat BMP identified closing of anion gap after 1 L of IV fluids. Anion gap 13 mEq/L.    7:07 PM This provider re-assessed the patient - patient sitting comfortably eating dinner. Negative shakes or tremors. Negative asterixis.   Doubt DT's. Patient neurologically intact. GCS 15. Negative shakes or tremors on exam. Patient medically cleared. TTS order placed. Patient on CIWA. Patient stable, afebrile - stable for transfer. Patient moved to psych ED.   Raymon Mutton, PA-C 04/21/14 0234

## 2014-04-20 NOTE — ED Notes (Signed)
Bed: WA24 Expected date:  Expected time:  Means of arrival:  Comments: EMS ETOH 

## 2014-04-20 NOTE — ED Notes (Signed)
Pt states he is here for detox from alcohol, states last time drank alcohol was this morning, he had 1/5 wine and 2 40 oz beers today, pt a/o x 3, in no distress, denies SI/HI.

## 2014-04-20 NOTE — ED Notes (Signed)
Per EMS pt was picked up at bus depo, pt is wanting detox from alcohol, last drink was this morning, pt drank 1/5 wine and a 40 oz beer, pt stated to EMS that is his norm. Pt ambulated to room w/ EMS.

## 2014-04-21 ENCOUNTER — Encounter (HOSPITAL_COMMUNITY): Payer: Self-pay | Admitting: *Deleted

## 2014-04-21 ENCOUNTER — Observation Stay (HOSPITAL_COMMUNITY)
Admission: RE | Admit: 2014-04-21 | Discharge: 2014-04-23 | Disposition: A | Discharge status: Home / Self Care | Payer: Medicare Other | Source: Intra-hospital | Attending: Psychiatry | Admitting: Psychiatry

## 2014-04-21 DIAGNOSIS — F1014 Alcohol abuse with alcohol-induced mood disorder: Secondary | ICD-10-CM

## 2014-04-21 MED ORDER — CHLORDIAZEPOXIDE HCL 25 MG PO CAPS
25.0000 mg | ORAL_CAPSULE | Freq: Four times a day (QID) | ORAL | Status: AC
  Administered 2014-04-21 (×3): 25 mg via ORAL
  Filled 2014-04-21 (×2): qty 1

## 2014-04-21 MED ORDER — VITAMIN B-1 100 MG PO TABS
100.0000 mg | ORAL_TABLET | Freq: Every day | ORAL | Status: DC
  Administered 2014-04-22 – 2014-04-23 (×2): 100 mg via ORAL
  Filled 2014-04-21 (×3): qty 1

## 2014-04-21 MED ORDER — TRAZODONE HCL 50 MG PO TABS
50.0000 mg | ORAL_TABLET | Freq: Every evening | ORAL | Status: DC | PRN
  Administered 2014-04-21 – 2014-04-23 (×3): 50 mg via ORAL
  Filled 2014-04-21 (×5): qty 1

## 2014-04-21 MED ORDER — POTASSIUM CHLORIDE CRYS ER 20 MEQ PO TBCR
20.0000 meq | EXTENDED_RELEASE_TABLET | Freq: Two times a day (BID) | ORAL | Status: AC
  Administered 2014-04-21 – 2014-04-22 (×3): 20 meq via ORAL
  Filled 2014-04-21 (×6): qty 1

## 2014-04-21 MED ORDER — HYDROXYZINE HCL 25 MG PO TABS: 25.0000 mg | ORAL_TABLET | Freq: Four times a day (QID) | ORAL | Status: DC | PRN

## 2014-04-21 MED ORDER — ALUM & MAG HYDROXIDE-SIMETH 200-200-20 MG/5ML PO SUSP
30.0000 mL | ORAL | Status: DC | PRN
Start: 2014-04-21 — End: 2014-04-23

## 2014-04-21 MED ORDER — CHLORDIAZEPOXIDE HCL 25 MG PO CAPS
25.0000 mg | ORAL_CAPSULE | Freq: Three times a day (TID) | ORAL | Status: AC
  Administered 2014-04-22 (×2): 25 mg via ORAL
  Filled 2014-04-21 (×2): qty 1

## 2014-04-21 MED ORDER — ONDANSETRON 4 MG PO TBDP: 4.0000 mg | ORAL_TABLET | Freq: Four times a day (QID) | ORAL | Status: DC | PRN

## 2014-04-21 MED ORDER — CHLORDIAZEPOXIDE HCL 25 MG PO CAPS: 25.0000 mg | ORAL_CAPSULE | Freq: Every day | ORAL | Status: DC

## 2014-04-21 MED ORDER — ACETAMINOPHEN 325 MG PO TABS
650.0000 mg | ORAL_TABLET | Freq: Four times a day (QID) | ORAL | Status: DC | PRN
Start: 2014-04-21 — End: 2014-04-23

## 2014-04-21 MED ORDER — MAGNESIUM HYDROXIDE 400 MG/5ML PO SUSP: 30.0000 mL | Freq: Every day | ORAL | Status: DC | PRN

## 2014-04-21 MED ORDER — ADULT MULTIVITAMIN W/MINERALS CH
1.0000 | ORAL_TABLET | Freq: Every day | ORAL | Status: DC
  Administered 2014-04-21 – 2014-04-23 (×3): 1 via ORAL
  Filled 2014-04-21 (×5): qty 1

## 2014-04-21 MED ORDER — CHLORDIAZEPOXIDE HCL 25 MG PO CAPS
25.0000 mg | ORAL_CAPSULE | Freq: Four times a day (QID) | ORAL | Status: DC | PRN
Start: 2014-04-21 — End: 2014-04-23
  Filled 2014-04-21: qty 1

## 2014-04-21 MED ORDER — LOPERAMIDE HCL 2 MG PO CAPS
2.0000 mg | ORAL_CAPSULE | ORAL | Status: DC | PRN
  Administered 2014-04-21: 4 mg via ORAL
  Filled 2014-04-21: qty 2

## 2014-04-21 MED ORDER — THIAMINE HCL 100 MG/ML IJ SOLN: 100.0000 mg | Freq: Once | INTRAMUSCULAR | Status: DC

## 2014-04-21 MED ORDER — CHLORDIAZEPOXIDE HCL 25 MG PO CAPS
25.0000 mg | ORAL_CAPSULE | ORAL | Status: DC
  Administered 2014-04-23: 25 mg via ORAL
  Filled 2014-04-21: qty 1

## 2014-04-21 NOTE — Plan of Care (Addendum)
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 Acct Type   Ralph Jackson, Ralph Jackson Self [facility] Yes Behavioral Health   Address Phone       [address] [place], [place] [id number] [phone])          Coverage Information (for Hospital Account [id number])   F/O Payor/Plan Precert #   MEDICARE/MEDICARE PART A AND B    Subscriber Subscriber #   Ralph Jackson, Ralph Jackson [id number]   Address Phone   [address] [place], [place] [phone]       Legal Guardian:   Self  Primary Care Provider:  Default, Provider, MD; None  Current Outpatient Providers:  None  Psychiatrist:  Name of Psychiatrist: None Reported  Counselor/Therapist:  Name of Therapist: None Reported  Compliant with Medications:  Yes; pt is not currently on any medications  Additional Information: After consulting with Claudette Headonrad Withrow, NP it has been determined that pt would be best served by remaining in the Observation Unit for a second night (04/21/14 - 04/22/14) for further monitoring.  Pt is agreeable to this plan.  Doylene Canninghomas Genia Perin, MA Triage Specialist Raphael GibneyHughes, Airyanna Dipalma Patrick 8/20/20152:04 PM  Addendum: After consulting with Nehemiah MassedFernando Cobos, MD and Geoffery LyonsIrving Lugo, MD on 04/22/2014, it has been determined that pt is to be retained in the Observation Unit for a third night (04/22/2014 - 04/23/2014) for further monitoring.  Tomorrow [facility] or he will be discharged.

## 2014-04-21 NOTE — ED Notes (Signed)
[facility] service for continuation of specialized care. She left in no acute distress. Patient valuable envelope given to Jearld Pieson, [facility] staff member to give to [facility].

## 2014-04-21 NOTE — H&P (Signed)
[facility] OBS UNIT H&P  Patient Identification:  Ralph Jackson Date of Evaluation:  04/21/2014 Chief Complaint:  MDD  Subjective: Pt seen and chart reviewed. Pt denies SI, HI, and AVH, contracts for safety. Pt is well-known to this NP and other [facility] staff. Pt is calm, coopeartive, alert/oriented x4, and answering questions appropriately. Will hold in OBS to determine necessity of inpatient admission vs. Outpatient resources.   History of Present Illness::  Ralph Jackson is an 68 y.o. male requesting detox from alcohol. 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 Patient repots that he drinks a fifth of liquor daily. Patient has a CIWA score of 5. Patient reports withdrawal symptoms. Patient reports depression and hopelessness associated with his inability to stop drinking. Patient reports that he has been to a lot of detox and treatment facilities and he is not abe to remember the name or the when he was there. Patient report that his last drink was last last night. Patient denies SI/HI/Psychosois. Patient denies physical, sexual or emotional abuse.    Total Time spent on patient: 45 minutes  Psychiatric Specialty Exam: Physical Exam  Constitutional:  Physical exam findings reviewed from the [facility] and I concur with no noted exceptions.     Review of Systems  Constitutional: Negative.   HENT: Negative.   Eyes: Negative.   Respiratory: Negative.   Cardiovascular: Negative.   Gastrointestinal: Negative.   Genitourinary: Negative.   Musculoskeletal: Negative.   Skin: Negative.   Psychiatric/Behavioral: Positive for depression and substance abuse. The patient is nervous/anxious.     Blood pressure 147/108, pulse 85, temperature 98.2 F (36.8 C), temperature source Oral, resp. rate 16, height '5\' 4"'  (1.626 m), weight 43.545 kg (96 lb).Body mass index is 16.47 kg/(m^2).  General Appearance: Disheveled  Eye Sport and exercise psychologist::  Fair  Speech:  Clear and Coherent and Slow   Volume:  Decreased  Mood:  Dysphoric  Affect:  Flat  Thought Process:  Coherent  Orientation:  Full (Time, Place, and Person)  Thought Content:  WDL  Suicidal Thoughts:  No  Homicidal Thoughts:  No  Memory:  Immediate;   Fair Recent;   Fair Remote;   Fair  Judgement:  Poor  Insight:  Shallow  Psychomotor Activity:  Decreased and Tremor  Concentration:  Fair  Recall:  [place]: Good  Akathisia:  No  Handed:  Right  AIMS (if indicated):     Assets:  Communication Skills Desire for Improvement Leisure Time Resilience  Sleep:       Musculoskeletal: Strength & Muscle Tone: decreased Gait & Station: unsteady Patient leans: N/A   Past Medical History:   Past Medical History  Diagnosis Date  . Hypertension   . Urinary tract infection   . ETOH abuse    None. Allergies:  No Known Allergies PTA Medications: No prescriptions prior to admission    Previous Psychotropic Medications:  Medication/Dose  Denies               Substance Abuse History in the last 12 months:  Yes.    Consequences of Substance Abuse: Patient has been abusing alcohol for many years.   Social History:  reports that he has never smoked. He uses smokeless tobacco. He reports that he drinks alcohol. He reports that he does not use illicit drugs. Additional Social History: Pain Medications: denies Prescriptions: denies Over the Counter: denies History of alcohol / drug use?: Yes Negative Consequences of  Use: Personal relationships Withdrawal Symptoms: Anorexia;Blackouts;Tremors Name of Substance 1: wine 1 - Amount (size/oz): 1/5  1 - Frequency: daily 1 - Last Use / Amount: 04/20/14                   Family History:  History reviewed. No pertinent family history.  Results for orders placed during the hospital encounter of 04/20/14 (from the past 72 hour(s))  ACETAMINOPHEN LEVEL     Status: None   Collection Time    04/20/14  2:31 PM       Result Value Ref Range   Acetaminophen (Tylenol), Serum <15.0  10 - 30 ug/mL   Comment:            THERAPEUTIC CONCENTRATIONS VARY     SIGNIFICANTLY. A RANGE OF 10-30     ug/mL MAY BE AN EFFECTIVE     CONCENTRATION FOR MANY PATIENTS.     HOWEVER, SOME ARE BEST TREATED     AT CONCENTRATIONS OUTSIDE THIS     RANGE.     ACETAMINOPHEN CONCENTRATIONS     >150 ug/mL AT 4 HOURS AFTER     INGESTION AND >50 ug/mL AT 12     HOURS AFTER INGESTION ARE     OFTEN ASSOCIATED WITH TOXIC     REACTIONS.  CBC     Status: None   Collection Time    04/20/14  2:31 PM      Result Value Ref Range   WBC 6.9  4.0 - 10.5 K/uL   RBC 4.31  4.22 - 5.81 MIL/uL   Hemoglobin 14.0  13.0 - 17.0 g/dL   HCT 40.0  39.0 - 52.0 %   MCV 92.8  78.0 - 100.0 fL   MCH 32.5  26.0 - 34.0 pg   MCHC 35.0  30.0 - 36.0 g/dL   RDW 15.0  11.5 - 15.5 %   Platelets 199  150 - 400 K/uL  COMPREHENSIVE METABOLIC PANEL     Status: Abnormal   Collection Time    04/20/14  2:31 PM      Result Value Ref Range   Sodium 135 (*) 137 - 147 mEq/L   Potassium 3.7  3.7 - 5.3 mEq/L   Chloride 91 (*) 96 - 112 mEq/L   CO2 25  19 - 32 mEq/L   Glucose, Bld 107 (*) 70 - 99 mg/dL   BUN 8  6 - 23 mg/dL   Creatinine, Ser 0.80  0.50 - 1.35 mg/dL   Calcium 9.2  8.4 - 10.5 mg/dL   Total Protein 7.7  6.0 - 8.3 g/dL   Albumin 3.8  3.5 - 5.2 g/dL   AST 41 (*) 0 - 37 U/L   ALT 28  0 - 53 U/L   Alkaline Phosphatase 166 (*) 39 - 117 U/L   Total Bilirubin 0.7  0.3 - 1.2 mg/dL   GFR calc non Af Amer >90  >90 mL/min   GFR calc Af Amer >90  >90 mL/min   Comment: (NOTE)     The eGFR has been calculated using the CKD EPI equation.     This calculation has not been validated in all clinical situations.     eGFR's persistently <90 mL/min signify possible Chronic Kidney     Disease.   Anion gap 19 (*) 5 - 15  ETHANOL     Status: Abnormal   Collection Time    04/20/14  2:31 PM      Result Value Ref  Range   Alcohol, Ethyl (B) 170 (*) 0 - 11 mg/dL    Comment:            LOWEST DETECTABLE LIMIT FOR     SERUM ALCOHOL IS 11 mg/dL     FOR MEDICAL PURPOSES ONLY  SALICYLATE LEVEL     Status: Abnormal   Collection Time    04/20/14  2:31 PM      Result Value Ref Range   Salicylate Lvl <3.8 (*) 2.8 - 20.0 mg/dL  URINE RAPID DRUG SCREEN (HOSP PERFORMED)     Status: Abnormal   Collection Time    04/20/14  2:43 PM      Result Value Ref Range   Opiates NONE DETECTED  NONE DETECTED   Cocaine NONE DETECTED  NONE DETECTED   Benzodiazepines POSITIVE (*) NONE DETECTED   Amphetamines NONE DETECTED  NONE DETECTED   Tetrahydrocannabinol NONE DETECTED  NONE DETECTED   Barbiturates NONE DETECTED  NONE DETECTED   Comment:            DRUG SCREEN FOR MEDICAL PURPOSES     ONLY.  IF CONFIRMATION IS NEEDED     FOR ANY PURPOSE, NOTIFY LAB     WITHIN 5 DAYS.                LOWEST DETECTABLE LIMITS     FOR URINE DRUG SCREEN     Drug Class       Cutoff (ng/mL)     Amphetamine      1000     Barbiturate      200     Benzodiazepine   937     Tricyclics       342     Opiates          300     Cocaine          300     THC              50  URINALYSIS, ROUTINE W REFLEX MICROSCOPIC     Status: Abnormal   Collection Time    04/20/14  3:24 PM      Result Value Ref Range   Color, Urine YELLOW  YELLOW   APPearance CLOUDY (*) CLEAR   Specific Gravity, Urine 1.009  1.005 - 1.030   pH 5.5  5.0 - 8.0   Glucose, UA NEGATIVE  NEGATIVE mg/dL   Hgb urine dipstick NEGATIVE  NEGATIVE   Bilirubin Urine NEGATIVE  NEGATIVE   Ketones, ur NEGATIVE  NEGATIVE mg/dL   Protein, ur NEGATIVE  NEGATIVE mg/dL   Urobilinogen, UA 0.2  0.0 - 1.0 mg/dL   Nitrite NEGATIVE  NEGATIVE   Leukocytes, UA NEGATIVE  NEGATIVE   Comment: MICROSCOPIC NOT DONE ON URINES WITH NEGATIVE PROTEIN, BLOOD, LEUKOCYTES, NITRITE, OR GLUCOSE <1000 mg/dL.  BASIC METABOLIC PANEL     Status: Abnormal   Collection Time    04/20/14  6:14 PM      Result Value Ref Range   Sodium 138  137 - 147 mEq/L    Potassium 3.3 (*) 3.7 - 5.3 mEq/L   Chloride 99  96 - 112 mEq/L   Comment: DELTA CHECK NOTED   CO2 26  19 - 32 mEq/L   Glucose, Bld 90  70 - 99 mg/dL   BUN 8  6 - 23 mg/dL   Creatinine, Ser 0.85  0.50 - 1.35 mg/dL   Calcium 7.9 (*) 8.4 - 10.5 mg/dL   GFR  calc non Af Amer 88 (*) >90 mL/min   GFR calc Af Amer >90  >90 mL/min   Comment: (NOTE)     The eGFR has been calculated using the CKD EPI equation.     This calculation has not been validated in all clinical situations.     eGFR's persistently <90 mL/min signify possible Chronic Kidney     Disease.   Anion gap 13  5 - 15   Psychological Evaluations:  Assessment:   DSM5:  AXIS I:  Alcohol Abuse              Substance induced mood disorder  AXIS II:  Deferred AXIS III:   Past Medical History  Diagnosis Date  . Hypertension   . Urinary tract infection   . ETOH abuse    AXIS IV:  other psychosocial or environmental problems AXIS V:  41-50 serious symptoms  Treatment Plan/Recommendations:   Review of chart, vital signs, medications, and notes.  1-Medication management for depression and anxiety: Medications reviewed with the patient and she stated no untoward effects, unchanged. 2-Coping skills for depression, anxiety  3-Continue crisis stabilization and management  4-Address health issues--monitoring vital signs, stable  5-Treatment plan in progress to prevent relapse of depression and anxiety  Treatment Plan Summary: Medication management Current Medications:  Current Facility-Administered Medications  Medication Dose Route Frequency Provider Last Rate Last Dose  . acetaminophen (TYLENOL) tablet 650 mg  650 mg Oral Q6H PRN Laverle Hobby, PA-C      . alum & mag hydroxide-simeth (MAALOX/MYLANTA) 200-200-20 MG/5ML suspension 30 mL  30 mL Oral Q4H PRN Laverle Hobby, PA-C      . chlordiazePOXIDE (LIBRIUM) capsule 25 mg  25 mg Oral Q6H PRN Laverle Hobby, PA-C      . chlordiazePOXIDE (LIBRIUM) capsule 25 mg  25 mg Oral  QID Laverle Hobby, PA-C   25 mg at 04/21/14 6468   Followed by  . [START ON 04/22/2014] chlordiazePOXIDE (LIBRIUM) capsule 25 mg  25 mg Oral TID Laverle Hobby, PA-C       Followed by  . [START ON 04/23/2014] chlordiazePOXIDE (LIBRIUM) capsule 25 mg  25 mg Oral BH-qamhs Spencer E Simon, PA-C       Followed by  . [START ON 04/24/2014] chlordiazePOXIDE (LIBRIUM) capsule 25 mg  25 mg Oral Daily Laverle Hobby, PA-C      . hydrOXYzine (ATARAX/VISTARIL) tablet 25 mg  25 mg Oral Q6H PRN Laverle Hobby, PA-C      . loperamide (IMODIUM) capsule 2-4 mg  2-4 mg Oral PRN Laverle Hobby, PA-C   4 mg at 04/21/14 0936  . magnesium hydroxide (MILK OF MAGNESIA) suspension 30 mL  30 mL Oral Daily PRN Laverle Hobby, PA-C      . multivitamin with minerals tablet 1 tablet  1 tablet Oral Daily Laverle Hobby, PA-C   1 tablet at 04/21/14 [phone]  . ondansetron (ZOFRAN-ODT) disintegrating tablet 4 mg  4 mg Oral Q6H PRN Laverle Hobby, PA-C      . potassium chloride SA (K-DUR,KLOR-CON) CR tablet 20 mEq  20 mEq Oral BID Laverle Hobby, PA-C   20 mEq at 04/21/14 2248  . thiamine (B-1) injection 100 mg  100 mg Intramuscular Once Laverle Hobby, PA-C      . [START ON 04/22/2014] thiamine (VITAMIN B-1) tablet 100 mg  100 mg Oral Daily Laverle Hobby, PA-C      . traZODone (DESYREL) tablet  50 mg  50 mg Oral QHS,MR X 1 Laverle Hobby, PA-C        Benjamine Mola, [place] 8/20/201510:25 AM

## 2014-04-21 NOTE — Progress Notes (Signed)
[facility] INPATIENT:  Family/Significant Other Suicide Prevention Education  Suicide Prevention Education:  Patient Refusal for Family/Significant Other Suicide Prevention Education: The patient Ralph Jackson has refused to provide written consent for family/significant other to be provided Family/Significant Other Suicide Prevention Education during admission and/or prior to discharge.  Celene Kras.  Mayuri Staples G 04/21/2014, 6:45 AM

## 2014-04-21 NOTE — Progress Notes (Signed)
Pt admitted to Observation Unit for Alcohol Detox. Pt alert and oriented. Pt denies SI/HI, -A/[place]. Verbally contracts for safety. Denies pain or discomfort. Pt presents disheveled and has generalized weakness. Will monitor closely and evaluate for stabilization.

## 2014-04-21 NOTE — ED Provider Notes (Signed)
Medical screening examination/treatment/procedure(s) were performed by non-physician practitioner and as supervising physician I was immediately available for consultation/collaboration.   EKG Interpretation None        Enid SkeensJoshua M Rissie Sculley, MD 04/21/14 [phone]

## 2014-04-21 NOTE — ED Provider Notes (Signed)
[id number] - [facility] Accepting physician: Dr. Renata Capriceonrad. EMTALA completed for transfer.  Filed Vitals:   04/20/14 1935 04/20/14 2209 04/21/14 0051 04/21/14 0052  BP: 151/84 134/84 136/88 136/88  Pulse: 90 91 76 76  Temp: 97.9 F (36.6 C) 98.1 F (36.7 C) 98.1 F (36.7 C)   TempSrc: Oral Oral Oral   Resp: 16 18 18    SpO2: 97% 98% 98%      Antony MaduraKelly Brooks Kinnan, PA-C 04/21/14 [phone]

## 2014-04-21 NOTE — BH Assessment (Signed)
Assessment Note  Ralph Jackson is an 68 y.o. male requesting detox from alcohol.  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  Patient repots that he drinks a fifth of liquor daily.  Patient has a CIWA score of 5.  Patient reports withdrawal symptoms.  Patient reports depression and hopelessness associated with his inability to stop drinking. Patient reports that he has been to a lot of detox and treatment facilities and he is not abe to remember the name or the when he was there.  Patient report that his last drink was last last night.    Patient denies SI/HI/Psychosois.  Patient denies physical, sexual or emotional abuse.  Patient denies previous psychiatric hospitalization.        Axis I: Substance Abuse, Severe  Axis II: Deferred Axis III:  Past Medical History  Diagnosis Date  . Hypertension   . Urinary tract infection   . ETOH abuse    Axis IV: economic problems, housing problems, other psychosocial or environmental problems, problems related to social environment, problems with access to health care services and problems with primary support group Axis V: 31-40 impairment in reality testing  Past Medical History:  Past Medical History  Diagnosis Date  . Hypertension   . Urinary tract infection   . ETOH abuse     History reviewed. No pertinent past surgical history.  Family History: History reviewed. No pertinent family history.  Social History:  reports that he has never smoked. He uses smokeless tobacco. He reports that he drinks alcohol. He reports that he does not use illicit drugs.  Additional Social History:  Alcohol / Drug Use Pain Medications: denies Prescriptions: denies Over the Counter: denies History of alcohol / drug use?: Yes Negative Consequences of Use: Personal relationships Withdrawal Symptoms: Anorexia;Blackouts;Tremors Substance #1 Name of Substance 1: wine 1 - Amount (size/oz): 1/5  1 - Frequency: daily 1 - Last Use /  Amount: 04/20/14  CIWA: CIWA-Ar BP: 147/108 mmHg Pulse Rate: 85 Nausea and Vomiting: no nausea and no vomiting Tactile Disturbances: none Tremor: not visible, but can be felt fingertip to fingertip Auditory Disturbances: not present Paroxysmal Sweats: no sweat visible Visual Disturbances: not present Anxiety: mildly anxious Headache, Fullness in Head: none present Agitation: normal activity Orientation and Clouding of Sensorium: oriented and can do serial additions CIWA-Ar Total: 2 COWS: Clinical Opiate Withdrawal Scale (COWS) Resting Pulse Rate: Pulse Rate 80 or below Sweating: No report of chills or flushing Restlessness: Able to sit still Pupil Size: Pupils pinned or normal size for room light Bone or Joint Aches: Not present Runny Nose or Tearing: Not present GI Upset: No GI symptoms Tremor: Tremor can be felt, but not observed Yawning: Yawning once or twice during assessment Anxiety or Irritability: Patient reports increasing irritability or anxiousness Gooseflesh Skin: Skin is smooth COWS Total Score: 3  Allergies: No Known Allergies  Home Medications:  No prescriptions prior to admission    OB/GYN Status:  No LMP for male patient.  General Assessment Data Location of Assessment: [facility] ED Is this a Tele or Face-to-Face Assessment?: Face-to-Face Is this an Initial Assessment or a Re-assessment for this encounter?: Initial Assessment Living Arrangements: Non-relatives/Friends;Other (Comment) Can pt return to current living arrangement?: Yes Admission Status: Voluntary Is patient capable of signing voluntary admission?: Yes Transfer from: Home Referral Source: Self/Family/Friend  Medical Screening Exam [facility] Walk-in ONLY) Medical Exam completed: Yes  [facility] Crisis Care Plan Living Arrangements: Non-relatives/Friends;Other (Comment) Name of Psychiatrist: None Reported  Name of Therapist: None Reported  Education Status Is patient currently in school?: No Current  Grade: NA Highest grade of school patient has completed: 12th Name of school: NA Contact person: NA  Risk to self with the past 6 months Suicidal Ideation: No Suicidal Intent: No Is patient at risk for suicide?: No Suicidal Plan?: No Access to Means: No What has been your use of drugs/alcohol within the last 12 months?: Alcohol Previous Attempts/Gestures: No How many times?: 0 Other Self Harm Risks: NA Triggers for Past Attempts: Unpredictable Intentional Self Injurious Behavior: None Family Suicide History: No Recent stressful life event(s): Job Loss;Financial Problems Persecutory voices/beliefs?: No Depression: Yes Depression Symptoms: Insomnia;Guilt;Loss of interest in usual pleasures;Isolating;Feeling worthless/self pity Substance abuse history and/or treatment for substance abuse?: Yes Suicide prevention information given to non-admitted patients: Not applicable  Risk to Others within the past 6 months Homicidal Ideation: No Thoughts of Harm to Others: No Current Homicidal Intent: No Current Homicidal Plan: No Access to Homicidal Means: No Identified Victim: NA History of harm to others?: No Assessment of Violence: None Noted Violent Behavior Description: NA Does patient have access to weapons?: No Criminal Charges Pending?: No Does patient have a court date: No  Psychosis Hallucinations: None noted Delusions: None noted  Mental Status Report Appear/Hygiene: Disheveled;In scrubs Eye Contact: Fair Motor Activity: Freedom of movement Speech: Logical/coherent Level of Consciousness: Alert Mood: Depressed Affect: Appropriate to circumstance Anxiety Level: None Thought Processes: Coherent;Relevant Judgement: Unimpaired Orientation: Person;Place;Time;Situation Obsessive Compulsive Thoughts/Behaviors: None  Cognitive Functioning Concentration: Normal Memory: Recent Intact;Remote Intact IQ: Average Insight: Fair Impulse Control: Fair Appetite: Fair Weight  Loss: 0 Weight Gain: 0 Sleep: Decreased Total Hours of Sleep: 4 Vegetative Symptoms: Decreased grooming  ADLScreening [facility] Assessment [facility]) Patient's cognitive ability adequate to safely complete daily activities?: Yes Patient able to express need for assistance with ADLs?: Yes Independently performs ADLs?: Yes (appropriate for developmental age)  Prior Inpatient Therapy Prior Inpatient Therapy: Yes Prior Therapy Dates: 2012 Prior Therapy Facilty/Provider(s): [facility] Reason for Treatment: Detox  Prior Outpatient Therapy Prior Outpatient Therapy: No Prior Therapy Dates: NA Prior Therapy Facilty/Provider(s): NA Reason for Treatment: NA  ADL Screening (condition at time of admission) Patient's cognitive ability adequate to safely complete daily activities?: Yes Is the patient deaf or have difficulty hearing?: No Does the patient have difficulty seeing, even when wearing glasses/contacts?: No Does the patient have difficulty concentrating, remembering, or making decisions?: No Patient able to express need for assistance with ADLs?: Yes Does the patient have difficulty dressing or bathing?: No Independently performs ADLs?: Yes (appropriate for developmental age) Does the patient have difficulty walking or climbing stairs?: Yes Weakness of Legs: Both Weakness of Arms/Hands: Both  Home Assistive Devices/Equipment Home Assistive Devices/Equipment: None  Therapy Consults (therapy consults require a physician order) PT Evaluation Needed: No OT Evalulation Needed: No SLP Evaluation Needed: No Abuse/Neglect Assessment (Assessment to be complete while patient is alone) Physical Abuse: Denies Verbal Abuse: Denies Sexual Abuse: Denies Exploitation of patient/patient's resources: Denies Self-Neglect: Yes, present (Comment) Values / Beliefs Cultural Requests During Hospitalization: None Spiritual Requests During Hospitalization: None Consults Spiritual Care Consult Needed:  No Social Work Consult Needed: Yes (Comment) Advance Directives (For Healthcare) Does patient have an advance directive?: No Would patient like information on creating an advanced directive?: No - patient declined information Nutrition Screen- [facility] Patient's home diet: Regular Have you recently lost weight without trying?: Patient is unsure Have you been eating poorly because of a decreased appetite?: Yes Malnutrition Screening Tool  Score: 3  Additional Information 1:1 In Past 12 Months?: No CIRT Risk: No Elopement Risk: No Does patient have medical clearance?: Yes     Disposition: Per Donell Sievert, PA patient accepted to the OBS Unit.  Disposition Initial Assessment Completed for this Encounter: Yes Disposition of Patient: Other dispositions (Accepted to OBS Unit )  On Site Evaluation by:   Reviewed with Physician:    Phillip Heal LaVerne 04/21/2014 9:48 AM

## 2014-04-21 NOTE — ED Notes (Signed)
$  530.00 was found in patient pants pocket by Michelle,NT. Money was counted in front of Clinical research associatewriter and patient. Patient verified that the amount was correct and he also stated " that's my rent money".  The $530.00 was placed in valuable envelope and locked by security. Tiffany, RN, CN in main ED informed of the amount of money that was found in patient belongings by Clinical research associatewriter.

## 2014-04-21 NOTE — Progress Notes (Signed)
Patient ID: Ralph Jackson, male   DOB: [birth year], 68 y.o.   MRN: [id number] Patient depressed and hopeless due to his excessive drinking.  Mood depressed. Denies SI, HI, AVH. Complained of diarrhea. Rests well. Eating well. Will continue to monitor for safety.

## 2014-04-21 NOTE — Progress Notes (Signed)
Patient ID: Lockie MolaDouglas W Kelly, male   DOB: 1945/10/27, 68 y.o.   MRN: [id number]  Pt alert and oriented. Minimum interaction with staff. Affect/mood depressed. Requires stand by for safety with ambulation. Denies SI/HI, -A/V hall. Verbally contracts for safety. Denies pain or discomfort. Will continue to monitor closely and evaluate for stabilization.

## 2014-04-21 NOTE — Progress Notes (Signed)
Per Donell SievertSpencer Simon the patient has been accepted to the Observation Unit.  Renata Capriceonrad, NP is the accepting.  The nurse will arrange transportation.   Writer informed the nurse and the ER MD and Dr. Estell HarpinZammit of the patients disposition.  The nurse will arrange transportation for the patient.

## 2014-04-22 NOTE — Progress Notes (Signed)
Patient in bed resting at the beginning of the shift. His mood and affect calm and appropriate. He endorsed feeling better, denied SI/HI and denied Hallucinations. Staff offered patient snacks and provided support. He denied pain or any complaint. Q 15 minute check continues as ordered to maintain safety.

## 2014-04-22 NOTE — Progress Notes (Signed)
Patient ID: Lockie MolaDouglas W Ouderkirk, male   DOB: [birth year], 68 y.o.   MRN: [id number] Patient seen along with Dr. Dub MikesLugo, Social Worker, RN, NP Vernona Rieger( Laura). Patient is a 68 year old man with a long history of alcohol dependence. He had been admitted to inpatient unit from 7/23-8/4. At that time patient presented frail, undernourished, and unsteady in gait. After discharge he relapsed and has been drinking daily and heavily over recent weeks. He is currently not presenting with severe WDL, and has no tremors or diaphoresis, but presents frail, undernourished ( although staff states he seems better than on last admission) , and walks slowly/ with some difficulty. He is hoping to be able to go to a Rehab Setting once he completes detox. At this time he is on Librium detox protocol, currently starting his second detox day, which may be preventing more significant WDL symptoms from presenting. He is alert, attentive, calm, speaks softly and slowly, is minimizing depression, affect is appropriate, no thought disorder, not suicidal or homicidal or psychotic . No evidence of delirium currently. Based on his age, remote history of DTs,  and  Current relative frailty, staff agrees that it is prudent to  continue OBS level for now, to further complete his librium detox and  Ascertain that  he continues to stabilize with ongoing detox protocol.  Depending on  Presentation and ongoing improvement/response, may need inpatient detoxification or may be discharged.

## 2014-04-22 NOTE — Progress Notes (Signed)
Patient ID: Ralph Jackson, male   DOB: Jul 15, 1946, 68 y.o.   MRN: [id number] D-On awakening this am ate breakfast and toileted. He denies any pain or symptoms of withdrawal. Only complaint is of weakness. He is verbal and pleasant with a flat affect. He would like to go from here to a long term rehab facility. A-Support offered. Medications as ordered. Continue to monitor for safety and a safe detox. R-Waiting on NP today and disposition case worker to help with what steps are next for him.

## 2014-04-23 DIAGNOSIS — F1020 Alcohol dependence, uncomplicated: Secondary | ICD-10-CM

## 2014-04-23 NOTE — Progress Notes (Signed)
[facility]  04/23/2014 11:04 AM Lockie MolaDouglas W Hawn  MRN:  [id number] Subjective:  Patient was seen on rounds this  am alert, awake and oriented x3.  Patient remembered that he was admitted for alcohol intoxication and withdrawal.  Patient admitted to  drinking heavily for 40 years, been sober for 7 years and relapsed again in the past 20 years.  Patient reported that he has been to several detox facilities and ended up drinking as soon as he gets home.  Patient stated that he is not interested in detoxification now but will try to stop drinking alcohol by himself.  Patient states he is safe in his room in apartment.  He denied SI./HI/AVH/ .  He denied any hx of mental illness including Depression and he stated that he drink alcohol because he love the teste.  He stated that the only medication he take is Multivitamin.  Patient was seen going to the bathroom with steady gait.  Patient had mild tremors of his fingers  and he stated that he has been having tremors due to his alcoholism.  Dr Tawni CarnesSaranga was updated on patient's desire to be discharged and he agrees that patient can go home with outpatient rehabilitation referral . Diagnosis:  Alcohol Dependence. DSM5: Schizophrenia Disorders:  NA Obsessive-Compulsive Disorders:  NA Trauma-Stressor Disorders:   Substance/Addictive Disorders:  Alcohol Related Disorder - Severe (303.90) Depressive Disorders:   Total Time spent with patient: 20 minutes  Axis I: Alcohol Dependence Axis II: Deferred Axis III:  Past Medical History  Diagnosis Date  . Hypertension   . Urinary tract infection   . ETOH abuse    Axis IV: housing problems, other psychosocial or environmental problems, problems related to social environment and problems with primary support group Axis V: 61-70 mild symptoms  ADL's:  Impaired  Sleep: Fair  Appetite:  Fair  Suicidal Ideation:  DENIED Homicidal Ideation:  DENIED  Psychiatric Specialty Exam: Physical Exam   Constitutional: He is oriented to person, place, and time. He appears well-developed.  FRAIL LOOKING, MALNOURISHED MALE  Neurological: He is alert and oriented to person, place, and time.    Review of Systems  Constitutional: Negative.   HENT: Negative.   Eyes: Negative.   Respiratory: Negative.   Cardiovascular: Negative.   Gastrointestinal: Negative.        REPORTS APPETITE IS POOR/FAIR  Genitourinary: Positive for flank pain.  Musculoskeletal: Negative.   Skin: Negative.   Endo/Heme/Allergies: Negative.   Psychiatric/Behavioral: Positive for substance abuse (Alcoholism for about 40 years.  Quits and relapses.  Been to numerous detox treatment facility). Negative for depression, suicidal ideas, hallucinations and memory loss. The patient is not nervous/anxious and does not have insomnia.     Blood pressure 121/71, pulse 60, temperature 97.6 F (36.4 C), temperature source Oral, resp. rate 18, height 5\' 4"  (1.626 m), weight 43.545 kg (96 lb).Body mass index is 16.47 kg/(m^2).  General Appearance: Casual  Eye Contact::  Good  Speech:  Clear and Coherent and Normal Rate  Volume:  Normal  Mood:  Euthymic  Affect:  Congruent  Thought Process:  Coherent, Goal Directed and Intact  Orientation:  Full (Time, Place, and Person)  Thought Content:  WDL  Suicidal Thoughts:  No  Homicidal Thoughts:  No  Memory:  Immediate;   Good Recent;   Fair Remote;   Fair  Judgement:  Fair  Insight:  Good  Psychomotor Activity:  Tremor  Concentration:  Good  Recall:  NA  Fund of  Knowledge:Good  Language: Good  Akathisia:  NA  Handed:  Right  AIMS (if indicated):     Assets:  Desire for Improvement  Sleep:      Musculoskeletal: Strength & Muscle Tone: within normal limits Gait & Station: normal Patient leans: N/A  Current Medications: Current Facility-Administered Medications  Medication Dose Route Frequency Provider Last Rate Last Dose  . acetaminophen (TYLENOL) tablet 650 mg  650 mg  Oral Q6H PRN Kerry Hough, PA-C      . alum & mag hydroxide-simeth (MAALOX/MYLANTA) 200-200-20 MG/5ML suspension 30 mL  30 mL Oral Q4H PRN Kerry Hough, PA-C      . chlordiazePOXIDE (LIBRIUM) capsule 25 mg  25 mg Oral Q6H PRN Kerry Hough, PA-C      . chlordiazePOXIDE (LIBRIUM) capsule 25 mg  25 mg Oral BH-qamhs Spencer E Simon, PA-C   25 mg at 04/23/14 1610   Followed by  . [START ON 04/24/2014] chlordiazePOXIDE (LIBRIUM) capsule 25 mg  25 mg Oral Daily Kerry Hough, PA-C      . hydrOXYzine (ATARAX/VISTARIL) tablet 25 mg  25 mg Oral Q6H PRN Kerry Hough, PA-C      . loperamide (IMODIUM) capsule 2-4 mg  2-4 mg Oral PRN Kerry Hough, PA-C   4 mg at 04/21/14 0936  . magnesium hydroxide (MILK OF MAGNESIA) suspension 30 mL  30 mL Oral Daily PRN Kerry Hough, PA-C      . multivitamin with minerals tablet 1 tablet  1 tablet Oral Daily Kerry Hough, PA-C   1 tablet at 04/23/14 [phone]  . ondansetron (ZOFRAN-ODT) disintegrating tablet 4 mg  4 mg Oral Q6H PRN Kerry Hough, PA-C      . thiamine (B-1) injection 100 mg  100 mg Intramuscular Once [employer], PA-C      . thiamine (VITAMIN B-1) tablet 100 mg  100 mg Oral Daily Kerry Hough, PA-C   100 mg at 04/23/14 5409  . traZODone (DESYREL) tablet 50 mg  50 mg Oral QHS,MR X 1 Spencer E Simon, PA-C   50 mg at 04/23/14 8119    Lab Results: No results found for this or any previous visit (from the past 48 hour(s)).  Physical Findings: AIMS: Facial and Oral Movements Muscles of Facial Expression: None, normal Lips and Perioral Area: None, normal Jaw: None, normal Tongue: None, normal,Extremity Movements Upper (arms, wrists, hands, fingers): None, normal Lower (legs, knees, ankles, toes): None, normal, Trunk Movements Neck, shoulders, hips: None, normal, Overall Severity Severity of abnormal movements (highest score from questions above): None, normal Incapacitation due to abnormal movements: None, normal Patient's  awareness of abnormal movements (rate only patient's report): No Awareness, Dental Status Current problems with teeth and/or dentures?: No Does patient usually wear dentures?: No  CIWA:  CIWA-Ar Total: 0 COWS:  COWS Total Score: 2  Treatment Plan Summary: Ready for discharge home  Plan:  Discharge home.  Medical Decision Making Problem Points:  Established problem, stable/improving (1) Data Points:  Review and summation of old records (2)  I certify that inpatient services furnished can reasonably be expected to improve the patient's condition.   Dahlia Byes, C  PMHNP-BC 04/23/2014, 11:04 AM

## 2014-04-23 NOTE — Progress Notes (Signed)
Case discussed at length with NP. Chart reviewed. Agree with plan to discharge patient, with outpatient referrals given.  Ancil LinseySARANGA, Elverta Dimiceli, MD

## 2014-04-23 NOTE — Discharge Summary (Signed)
Chart reviewed. Case discussed with NP. Agree with above discharge summary.  Ancil Linsey, MD

## 2014-04-23 NOTE — [facility] Suicide Risk Assessment (Cosign Needed)
Suicide Risk Assessment  Discharge Assessment     Demographic Factors:  Male, Age 68 or older, Caucasian, Low socioeconomic status and Unemployed  Total Time spent with patient: 30 minutes  Psychiatric Specialty Exam:     Blood pressure 121/71, pulse 60, temperature 97.6 F (36.4 C), temperature source Oral, resp. rate 18, height 5\' 4"  (1.626 m), weight 43.545 kg (96 lb).Body mass index is 16.47 kg/(m^2).  General Appearance: Casual and Disheveled  Eye Contact::  Good  Speech:  Clear and Coherent and Normal Rate  Volume:  Normal  Mood:  Euthymic  Affect:  Congruent  Thought Process:  Coherent, Goal Directed and Intact  Orientation:  Full (Time, Place, and Person)  Thought Content:  WDL  Suicidal Thoughts:  No  Homicidal Thoughts:  No  Memory:  Immediate;   Good Recent;   Fair Remote;   Fair  Judgement:  Fair  Insight:  Good  Psychomotor Activity:  Tremor  Concentration:  Good  Recall:  NA  Fund of Knowledge:Good  Language: Good  Akathisia:  NA  Handed:  Right  AIMS (if indicated):     Assets:  Desire for Improvement  Sleep:       Musculoskeletal: Strength & Muscle Tone: within normal limits Gait & Station: normal Patient leans: N/A   Mental Status Per Nursing Assessment::   On Admission:  NA  Current Mental Status by Physician: NA  Loss Factors: NA  Historical Factors: NA  Risk Reduction Factors:   Living with another person, especially a relative and Positive coping skills or problem solving skills  Continued Clinical Symptoms:  Alcohol/Substance Abuse/Dependencies  Cognitive Features That Contribute To Risk:  Polarized thinking    Suicide Risk:  Minimal: No identifiable suicidal ideation.  Patients presenting with no risk factors but with morbid ruminations; may be classified as minimal risk based on the severity of the depressive symptoms  Discharge Diagnoses:   AXIS I:  Alcohol Dependence AXIS II:  Deferred AXIS III:   Past Medical  History  Diagnosis Date  . Hypertension   . Urinary tract infection   . ETOH abuse    AXIS IV:  other psychosocial or environmental problems, problems related to social environment and problems with primary support group AXIS V:  61-70 mild symptoms  Plan Of Care/Follow-up recommendations:  Activity:  as tolerated Diet:  regular  Is patient on multiple antipsychotic therapies at discharge:  No   Has Patient had three or more failed trials of antipsychotic monotherapy by history:  No  Recommended Plan for Multiple Antipsychotic Therapies: NA   Walfred Bettendorf, C    PMHNP-BC 04/23/2014, 11:49 AM

## 2014-04-23 NOTE — Discharge Summary (Signed)
  Patient is a 68 year old caucasian male who was admitted in our observation unit for alcohol intoxication and withdrawal.  He was placed on our alcohol detox protocol using Librium.   Patient has a hx of drinking alcohol for 40 years, has been to several detox treatment facilities.  He was last detoxed here in July and discharged early August.  Patient is alert, oriented and ready to go home.  Patient does not want inpatient admission for detox at this time.  Patient denied any mental illness diagnosis.  Patient denied SI/HI/AVH and he will be discharged home. Dahlia ByesJosephine Cassey Hurrell   PMHNP-BC

## 2014-04-23 NOTE — Progress Notes (Signed)
Patient ID: Ralph Jackson, male   DOB: 1946-03-16, 68 y.o.   MRN: [id number] D: Patient denies SI/HI and auditory and visual hallucinations. Patient depressed, affect sad but brightens on approach. Has slight tremmor but no other withdrawal symptoms. Eating well, looks much better than past few days. A: Patient given emotional support from RN. Patient given medications per MD orders.Patient encouraged to come to staff with any questions or concerns.  R: Patient remains cooperative and appropriate. Will continue to monitor patient for safety.

## 2014-04-23 NOTE — Progress Notes (Signed)
Pt discharged per MD orders; pt currently denies SI/HI and auditory/visual hallucinations; pt was given education by RN regarding follow-up appointments and pt denied any questions or concerns about these instructions; pt was then given belongings (including $530) and was discharged to hospital lobby. Patient was offered security escort to bus stop but patient refused this. Patient stated "I want to get there on my own."

## 2014-04-24 NOTE — ED Provider Notes (Signed)
Medical screening examination/treatment/procedure(s) were performed by non-physician practitioner and as supervising physician I was immediately available for consultation/collaboration.   EKG Interpretation None        Candyce Churn III, MD 04/24/14 0700

## 2014-05-04 ENCOUNTER — Encounter (HOSPITAL_COMMUNITY): Payer: Self-pay | Admitting: Emergency Medicine

## 2014-05-04 ENCOUNTER — Emergency Department (HOSPITAL_COMMUNITY)
Admission: EM | Admit: 2014-05-04 | Discharge: 2014-05-05 | Disposition: A | Discharge status: Other Acute Inpatient Hospital | Payer: Medicare Other | Attending: Emergency Medicine | Admitting: Emergency Medicine

## 2014-05-04 DIAGNOSIS — I10 Essential (primary) hypertension: Secondary | ICD-10-CM | POA: Diagnosis not present

## 2014-05-04 DIAGNOSIS — F1020 Alcohol dependence, uncomplicated: Secondary | ICD-10-CM | POA: Insufficient documentation

## 2014-05-04 DIAGNOSIS — Z008 Encounter for other general examination: Secondary | ICD-10-CM | POA: Insufficient documentation

## 2014-05-04 DIAGNOSIS — Z87440 Personal history of urinary (tract) infections: Secondary | ICD-10-CM | POA: Insufficient documentation

## 2014-05-04 DIAGNOSIS — F10229 Alcohol dependence with intoxication, unspecified: Secondary | ICD-10-CM

## 2014-05-04 LAB — COMPREHENSIVE METABOLIC PANEL
ALBUMIN: 3.2 g/dL — AB (ref 3.5–5.2)
ALT: 22 U/L (ref 0–53)
AST: 24 U/L (ref 0–37)
Alkaline Phosphatase: 143 U/L — ABNORMAL HIGH (ref 39–117)
Anion gap: 13 (ref 5–15)
BUN: 11 mg/dL (ref 6–23)
CO2: 29 mEq/L (ref 19–32)
Calcium: 8.6 mg/dL (ref 8.4–10.5)
Chloride: 101 mEq/L (ref 96–112)
Creatinine, Ser: 0.83 mg/dL (ref 0.50–1.35)
GFR calc non Af Amer: 89 mL/min — ABNORMAL LOW (ref >90)
Glucose, Bld: 128 mg/dL — ABNORMAL HIGH (ref 70–99)
Potassium: 3.6 mEq/L — ABNORMAL LOW (ref 3.7–5.3)
Sodium: 143 mEq/L (ref 137–147)
Total Bilirubin: 0.5 mg/dL (ref 0.3–1.2)
Total Protein: 6.6 g/dL (ref 6.0–8.3)

## 2014-05-04 LAB — SALICYLATE LEVEL

## 2014-05-04 LAB — CBC
HEMATOCRIT: 39.6 % (ref 39.0–52.0)
Hemoglobin: 13.5 g/dL (ref 13.0–17.0)
MCH: 32.6 pg (ref 26.0–34.0)
MCHC: 34.1 g/dL (ref 30.0–36.0)
MCV: 95.7 fL (ref 78.0–100.0)
Platelets: 265 10*3/uL (ref 150–400)
RBC: 4.14 MIL/uL — ABNORMAL LOW (ref 4.22–5.81)
RDW: 15.3 % (ref 11.5–15.5)
WBC: 6.7 10*3/uL (ref 4.0–10.5)

## 2014-05-04 LAB — ACETAMINOPHEN LEVEL: Acetaminophen (Tylenol), Serum: <15 ug/mL (ref 10–30)

## 2014-05-04 LAB — RAPID URINE DRUG SCREEN, HOSP PERFORMED
AMPHETAMINES: NOT DETECTED
BENZODIAZEPINES: POSITIVE — AB
Barbiturates: NOT DETECTED
Cocaine: NOT DETECTED
Opiates: NOT DETECTED
Tetrahydrocannabinol: NOT DETECTED

## 2014-05-04 LAB — ETHANOL: Alcohol, Ethyl (B): 203 mg/dL — ABNORMAL HIGH (ref 0–11)

## 2014-05-04 MED ORDER — LORAZEPAM 2 MG/ML IJ SOLN: 0.0000 mg | Freq: Four times a day (QID) | INTRAMUSCULAR | Status: DC

## 2014-05-04 MED ORDER — ALUM & MAG HYDROXIDE-SIMETH 200-200-20 MG/5ML PO SUSP: 30.0000 mL | ORAL | Status: DC | PRN

## 2014-05-04 MED ORDER — LORAZEPAM 1 MG PO TABS: 0.0000 mg | ORAL_TABLET | Freq: Two times a day (BID) | ORAL | Status: DC

## 2014-05-04 MED ORDER — LORAZEPAM 1 MG PO TABS
0.0000 mg | ORAL_TABLET | Freq: Four times a day (QID) | ORAL | Status: DC
  Administered 2014-05-05 (×2): 1 mg via ORAL
  Filled 2014-05-04 (×2): qty 1

## 2014-05-04 MED ORDER — LORAZEPAM 2 MG/ML IJ SOLN: 0.0000 mg | Freq: Two times a day (BID) | INTRAMUSCULAR | Status: DC

## 2014-05-04 MED ORDER — ACETAMINOPHEN 325 MG PO TABS
650.0000 mg | ORAL_TABLET | ORAL | Status: DC | PRN
  Administered 2014-05-05: 650 mg via ORAL
  Filled 2014-05-04: qty 2

## 2014-05-04 MED ORDER — ZOLPIDEM TARTRATE 5 MG PO TABS
5.0000 mg | ORAL_TABLET | Freq: Every evening | ORAL | Status: DC | PRN
Start: 2014-05-04 — End: 2014-05-05

## 2014-05-04 MED ORDER — NICOTINE 21 MG/24HR TD PT24: 21.0000 mg | MEDICATED_PATCH | Freq: Every day | TRANSDERMAL | Status: DC

## 2014-05-04 MED ORDER — VITAMIN B-1 100 MG PO TABS
100.0000 mg | ORAL_TABLET | Freq: Every day | ORAL | Status: DC
  Administered 2014-05-05: 100 mg via ORAL
  Filled 2014-05-04: qty 1

## 2014-05-04 MED ORDER — ONDANSETRON HCL 4 MG PO TABS: 4.0000 mg | ORAL_TABLET | Freq: Three times a day (TID) | ORAL | Status: DC | PRN

## 2014-05-04 MED ORDER — THIAMINE HCL 100 MG/ML IJ SOLN: 100.0000 mg | Freq: Every day | INTRAMUSCULAR | Status: DC

## 2014-05-04 NOTE — ED Provider Notes (Signed)
[name]: [id number]     Arrival date & time 05/04/14  1634 History   First MD Initiated Contact with Patient 05/04/14 1714     Chief Complaint  Patient presents with  . Medical Clearance    The patient said he wants to quit drinking.  He has been drinking for forty years.  He said he drinks a bottle of wine a day.  He said he drank two bottles today.     (Consider location/radiation/quality/duration/timing/severity/associated sxs/prior Treatment) HPI Comments: This is a 68 year old male past medical history significant for hypertension, alcohol abuse presenting to the emergency department requesting detox from alcohol. He states he drinks a bottle of wine a day or 1/5th of liquor a day. Patient told triage last drink was today, but told me his last drink was yesterday. Patient denies any history of hospitalizations for alcohol withdrawal symptoms or seizures. Denies any recreational drug use, SI, HI, hallucinations, self injury. Patient does not have any physical complaints at this time   Past Medical History  Diagnosis Date  . Hypertension   . Urinary tract infection   . ETOH abuse    History reviewed. No pertinent past surgical history. History reviewed. No pertinent family history. History  Substance Use Topics  . Smoking status: Never Smoker   . Smokeless tobacco: Current User  . Alcohol Use: Yes     Comment: Drinks 1/5 and 2-24oz beers, daily     Review of Systems  Psychiatric/Behavioral: Negative for suicidal ideas and hallucinations.  All other systems reviewed and are negative.     Allergies  Review of patient's allergies indicates no known allergies.  Home Medications   Prior to Admission medications   Not on File   BP 120/83  Pulse 81  Temp(Src) 98.4 F (36.9 C) (Oral)  Resp 14  Wt 91 lb 8 oz (41.504 kg)  SpO2 95% Physical Exam  Nursing note and vitals reviewed. Constitutional: He is oriented to person, place, and time. He appears well-developed and  well-nourished. No distress.  HENT:  Head: Normocephalic and atraumatic.  Right Ear: External ear normal.  Left Ear: External ear normal.  Nose: Nose normal.  Mouth/Throat: Oropharynx is clear and moist.  Eyes: Conjunctivae are normal.  Neck: Normal range of motion. Neck supple.  Cardiovascular: Normal rate, regular rhythm and normal heart sounds.   Pulmonary/Chest: Effort normal and breath sounds normal.  Abdominal: Soft. There is no tenderness.  Musculoskeletal: Normal range of motion.  Neurological: He is alert and oriented to person, place, and time.  Skin: Skin is warm and dry. He is not diaphoretic.  Psychiatric: He has a normal mood and affect. His speech is normal and behavior is normal. Judgment and thought content normal. Cognition and memory are normal.    ED Course  Procedures (including critical care time) Medications  LORazepam (ATIVAN) tablet 0-4 mg (0 mg Oral Not Given 05/04/14 2335)  LORazepam (ATIVAN) tablet 0-4 mg (0 mg Oral Not Given 05/04/14 1930)  thiamine (VITAMIN B-1) tablet 100 mg (not administered)  acetaminophen (TYLENOL) tablet 650 mg (not administered)  zolpidem (AMBIEN) tablet 5 mg (not administered)  nicotine (NICODERM CQ - dosed in mg/24 hours) patch 21 mg (not administered)  ondansetron (ZOFRAN) tablet 4 mg (not administered)  alum & mag hydroxide-simeth (MAALOX/MYLANTA) 200-200-20 MG/5ML suspension 30 mL (not administered)    Labs Review Labs Reviewed  CBC - Abnormal; Notable for the following:    RBC 4.14 (*)    All other components within normal limits  COMPREHENSIVE METABOLIC PANEL - Abnormal; Notable for the following:    Potassium 3.6 (*)    Glucose, Bld 128 (*)    Albumin 3.2 (*)    Alkaline Phosphatase 143 (*)    GFR calc non Af Amer 89 (*)    All other components within normal limits  ETHANOL - Abnormal; Notable for the following:    Alcohol, Ethyl (B) 203 (*)    All other components within normal limits  SALICYLATE LEVEL - Abnormal;  Notable for the following:    Salicylate Lvl <2.0 (*)    All other components within normal limits  ACETAMINOPHEN LEVEL  URINE RAPID DRUG SCREEN (HOSP PERFORMED)    Imaging Review No results found.   EKG Interpretation None      MDM   Final diagnoses:  Alcohol dependence with intoxication with complication    Filed Vitals:   05/04/14 2305  BP: 120/83  Pulse: 81  Temp: 98.4 F (36.9 C)  Resp: 14    Afebrile, NAD, non-toxic appearing, AAOx4.  I have reviewed nursing notes, vital signs, and all appropriate lab and imaging results for this patient.  Patient presenting to the ER for request for ETOH detox. Last drink yesterday. Labs reviewed. CIWA protocol ordered. Psych hold orders placed. TTS consulted. Patient placed in Psych ED awaiting further consultation.   Jeannetta Ellis, PA-C 05/04/14 2344

## 2014-05-04 NOTE — ED Notes (Signed)
The patient said he wants to quit drinking.  He has been drinking for forty years.  He said he drinks a bottle of wine a day.  He said he drank two bottles today.  The patient was in the waiting room and fell trying to get to the nurse first desk.  He said he has been drinking he denies pain anywhere.

## 2014-05-05 ENCOUNTER — Inpatient Hospital Stay (HOSPITAL_COMMUNITY)
Admission: AD | Admit: 2014-05-05 | Discharge: 2014-05-13 | DRG: 897 | Disposition: A | Discharge status: Home / Self Care | Payer: Medicare Other | Source: Intra-hospital | Attending: Psychiatry | Admitting: Psychiatry

## 2014-05-05 ENCOUNTER — Encounter (HOSPITAL_COMMUNITY): Payer: Self-pay

## 2014-05-05 DIAGNOSIS — I10 Essential (primary) hypertension: Secondary | ICD-10-CM | POA: Diagnosis present

## 2014-05-05 DIAGNOSIS — F1994 Other psychoactive substance use, unspecified with psychoactive substance-induced mood disorder: Secondary | ICD-10-CM | POA: Diagnosis present

## 2014-05-05 DIAGNOSIS — F10230 Alcohol dependence with withdrawal, uncomplicated: Secondary | ICD-10-CM

## 2014-05-05 DIAGNOSIS — F17200 Nicotine dependence, unspecified, uncomplicated: Secondary | ICD-10-CM | POA: Diagnosis present

## 2014-05-05 DIAGNOSIS — F1010 Alcohol abuse, uncomplicated: Secondary | ICD-10-CM | POA: Diagnosis present

## 2014-05-05 DIAGNOSIS — F1020 Alcohol dependence, uncomplicated: Principal | ICD-10-CM | POA: Diagnosis present

## 2014-05-05 DIAGNOSIS — Z23 Encounter for immunization: Secondary | ICD-10-CM

## 2014-05-05 DIAGNOSIS — F411 Generalized anxiety disorder: Secondary | ICD-10-CM | POA: Diagnosis present

## 2014-05-05 MED ORDER — MAGNESIUM HYDROXIDE 400 MG/5ML PO SUSP: 30.0000 mL | Freq: Every day | ORAL | Status: DC | PRN

## 2014-05-05 MED ORDER — CHLORDIAZEPOXIDE HCL 25 MG PO CAPS: 25.0000 mg | ORAL_CAPSULE | Freq: Four times a day (QID) | ORAL | Status: AC | PRN

## 2014-05-05 MED ORDER — VITAMIN B-1 100 MG PO TABS
100.0000 mg | ORAL_TABLET | Freq: Every day | ORAL | Status: DC
  Administered 2014-05-06 – 2014-05-13 (×8): 100 mg via ORAL
  Filled 2014-05-05 (×11): qty 1

## 2014-05-05 MED ORDER — CHLORDIAZEPOXIDE HCL 25 MG PO CAPS
25.0000 mg | ORAL_CAPSULE | Freq: Three times a day (TID) | ORAL | Status: AC
  Administered 2014-05-07 – 2014-05-08 (×3): 25 mg via ORAL
  Filled 2014-05-05 (×3): qty 1

## 2014-05-05 MED ORDER — ACETAMINOPHEN 325 MG PO TABS: 650.0000 mg | ORAL_TABLET | Freq: Four times a day (QID) | ORAL | Status: DC | PRN

## 2014-05-05 MED ORDER — ALUM & MAG HYDROXIDE-SIMETH 200-200-20 MG/5ML PO SUSP: 30.0000 mL | ORAL | Status: DC | PRN

## 2014-05-05 MED ORDER — CHLORDIAZEPOXIDE HCL 25 MG PO CAPS
25.0000 mg | ORAL_CAPSULE | ORAL | Status: AC
  Administered 2014-05-09: 25 mg via ORAL
  Filled 2014-05-05: qty 1

## 2014-05-05 MED ORDER — LOPERAMIDE HCL 2 MG PO CAPS: 2.0000 mg | ORAL_CAPSULE | ORAL | Status: AC | PRN

## 2014-05-05 MED ORDER — ADULT MULTIVITAMIN W/MINERALS CH
1.0000 | ORAL_TABLET | Freq: Every day | ORAL | Status: DC
  Administered 2014-05-06 – 2014-05-13 (×8): 1 via ORAL
  Filled 2014-05-05 (×11): qty 1

## 2014-05-05 MED ORDER — CHLORDIAZEPOXIDE HCL 25 MG PO CAPS
25.0000 mg | ORAL_CAPSULE | Freq: Every day | ORAL | Status: AC
  Administered 2014-05-10: 25 mg via ORAL
  Filled 2014-05-05: qty 1

## 2014-05-05 MED ORDER — CHLORDIAZEPOXIDE HCL 25 MG PO CAPS
25.0000 mg | ORAL_CAPSULE | Freq: Four times a day (QID) | ORAL | Status: AC
  Administered 2014-05-05 – 2014-05-07 (×6): 25 mg via ORAL
  Filled 2014-05-05 (×6): qty 1

## 2014-05-05 MED ORDER — HYDROXYZINE HCL 25 MG PO TABS
25.0000 mg | ORAL_TABLET | Freq: Four times a day (QID) | ORAL | Status: AC | PRN
  Administered 2014-05-05 – 2014-05-07 (×2): 25 mg via ORAL
  Filled 2014-05-05 (×2): qty 1

## 2014-05-05 MED ORDER — ONDANSETRON 4 MG PO TBDP: 4.0000 mg | ORAL_TABLET | Freq: Four times a day (QID) | ORAL | Status: AC | PRN

## 2014-05-05 NOTE — BH Assessment (Signed)
Tele Assessment Note   Ralph Jackson is a 68 y.o. male presenting to [facility] to for alcohol detox.  Pt denies SI/HI/AVH. Pt reports the following: her consumes 1/5 of liquor, daily.  Pt.'s last drink was 05/04/14, he drank 1/5 of liquor.  Pt denies any current w/d sxs. Pt reports no stressors attributing to his increased drinking, no legal issues.    Axis I: Alcohol Dependence  Axis II: Deferred Axis III:  Past Medical History  Diagnosis Date  . Hypertension   . Urinary tract infection   . ETOH abuse    Axis IV: other psychosocial or environmental problems, problems related to social environment and problems with primary support group Axis V: 51-60 moderate symptoms  Past Medical History:  Past Medical History  Diagnosis Date  . Hypertension   . Urinary tract infection   . ETOH abuse     History reviewed. No pertinent past surgical history.  Family History: History reviewed. No pertinent family history.  Social History:  reports that he has never smoked. He uses smokeless tobacco. He reports that he drinks alcohol. He reports that he does not use illicit drugs.  Additional Social History:  Alcohol / Drug Use Pain Medications: See MAR  Prescriptions: See MAR  Over the Counter: See MAR  History of alcohol / drug use?: Yes Longest period of sobriety (when/how long): On when in detox  Negative Consequences of Use: Work / Hospital doctor Withdrawal Symptoms: Other (Comment) Substance #1 Name of Substance 1: Alcohol  1 - Age of First Use: 20 YOM  1 - Amount (size/oz): 1/5 Liquor  1 - Frequency: Daily  1 - Duration: On-going  1 - Last Use / Amount: 05/04/14  CIWA: CIWA-Ar BP: 120/83 mmHg Pulse Rate: 81 Nausea and Vomiting: no nausea and no vomiting Tactile Disturbances: none Tremor: moderate, with patient's arms extended Auditory Disturbances: not present Paroxysmal Sweats: no sweat visible Visual Disturbances: not present Anxiety: no anxiety, at ease Headache,  Fullness in Head: none present Agitation: normal activity Orientation and Clouding of Sensorium: oriented and can do serial additions CIWA-Ar Total: 4 COWS:    PATIENT STRENGTHS: (choose at least two) General fund of knowledge Motivation for treatment/growth  Allergies: No Known Allergies  Home Medications:  (Not in a hospital admission)  OB/GYN Status:  No LMP for male patient.  General Assessment Data Location of Assessment: [facility] ED Is this a Tele or Face-to-Face Assessment?: Tele Assessment Is this an Initial Assessment or a Re-assessment for this encounter?: Initial Assessment Living Arrangements: Alone Can pt return to current living arrangement?: Yes Admission Status: Voluntary Is patient capable of signing voluntary admission?: Yes Transfer from: Acute Hospital Referral Source: MD  Medical Screening Exam [facility] Walk-in ONLY) Medical Exam completed: No Reason for MSE not completed: Other: (None )  [facility] Crisis Care Plan Living Arrangements: Alone Name of Psychiatrist: None  Name of Therapist: None   Education Status Is patient currently in school?: No Current Grade: None  Highest grade of school patient has completed: 12th Name of school: NA Contact person: NA  Risk to self with the past 6 months Suicidal Ideation: No Suicidal Intent: No Is patient at risk for suicide?: No Suicidal Plan?: No Access to Means: No What has been your use of drugs/alcohol within the last 12 months?: Abusing; alcohol  Previous Attempts/Gestures: No How many times?: 0 Other Self Harm Risks: None  Triggers for Past Attempts: None known Intentional Self Injurious Behavior: None Family Suicide History: No Recent stressful life  event(s): Other (Comment) (None reported ) Persecutory voices/beliefs?: No Depression: Yes Depression Symptoms: Loss of interest in usual pleasures Substance abuse history and/or treatment for substance abuse?: Yes Suicide prevention information given to  non-admitted patients: Not applicable  Risk to Others within the past 6 months Homicidal Ideation: No Thoughts of Harm to Others: No Current Homicidal Intent: No Current Homicidal Plan: No Access to Homicidal Means: No Identified Victim: None  History of harm to others?: No Assessment of Violence: None Noted Violent Behavior Description: None  Does patient have access to weapons?: No Criminal Charges Pending?: No Does patient have a court date: No  Psychosis Hallucinations: None noted Delusions: None noted  Mental Status Report Appear/Hygiene: Disheveled;In scrubs Eye Contact: Fair Motor Activity: Unremarkable Speech: Logical/coherent;Soft Level of Consciousness: Alert Mood: Other (Comment) (Appropriate ) Affect: Appropriate to circumstance Anxiety Level: None Thought Processes: Coherent;Relevant Judgement: Unimpaired Orientation: Person;Place;Time;Situation Obsessive Compulsive Thoughts/Behaviors: None  Cognitive Functioning Concentration: Normal Memory: Recent Intact;Remote Intact IQ: Average Insight: Fair Impulse Control: Fair Appetite: Fair Weight Loss: 0 Weight Gain: 0 Sleep: No Change Total Hours of Sleep: 6 Vegetative Symptoms: None  ADLScreening [facility] Assessment Services) Patient's cognitive ability adequate to safely complete daily activities?: Yes Patient able to express need for assistance with ADLs?: Yes Independently performs ADLs?: Yes (appropriate for developmental age)  Prior Inpatient Therapy Prior Inpatient Therapy: Yes Prior Therapy Dates: 2011,2015 Prior Therapy Facilty/Provider(s): [facility], [facility]  Reason for Treatment: Detox   Prior Outpatient Therapy Prior Outpatient Therapy: No Prior Therapy Dates: None  Prior Therapy Facilty/Provider(s): None  Reason for Treatment: None   ADL Screening (condition at time of admission) Patient's cognitive ability adequate to safely complete daily activities?: Yes Is the patient deaf or have  difficulty hearing?: No Does the patient have difficulty seeing, even when wearing glasses/contacts?: No Does the patient have difficulty concentrating, remembering, or making decisions?: No Patient able to express need for assistance with ADLs?: Yes Does the patient have difficulty dressing or bathing?: No Independently performs ADLs?: Yes (appropriate for developmental age) Does the patient have difficulty walking or climbing stairs?: No Weakness of Legs: None Weakness of Arms/Hands: None  Home Assistive Devices/Equipment Home Assistive Devices/Equipment: None  Therapy Consults (therapy consults require a physician order) PT Evaluation Needed: No OT Evalulation Needed: No SLP Evaluation Needed: No Abuse/Neglect Assessment (Assessment to be complete while patient is alone) Physical Abuse: Denies Verbal Abuse: Denies Sexual Abuse: Denies Exploitation of patient/patient's resources: Denies Self-Neglect: Denies Values / Beliefs Cultural Requests During Hospitalization: None Spiritual Requests During Hospitalization: None Consults Spiritual Care Consult Needed: No Social Work Consult Needed: No Merchant navy officer (For Healthcare) Does patient have an advance directive?: No Would patient like information on creating an advanced directive?: No - patient declined information Nutrition Screen- [facility] Adult/[facility]/AP Patient's home diet: Regular  Additional Information 1:1 In Past 12 Months?: No CIRT Risk: No Elopement Risk: No Does patient have medical clearance?: Yes     Disposition:  Disposition Initial Assessment Completed for this Encounter: Yes Disposition of Patient: Inpatient treatment program;Referred to Donell Sievert, [place]; recommend inpt admission ) Type of inpatient treatment program: Adult Patient referred to: Other (Comment) Donell Sievert, [place] recommend inpt admission)  Murrell Redden 05/05/2014 5:55 AM

## 2014-05-05 NOTE — Progress Notes (Signed)
Patient ID: Ralph Jackson, male   DOB: 1946/03/11, 68 y.o.   MRN: [id number]  Patient is a 68yr old voluntary admission from [facility] ED. Reports that he was here about a month ago. States wanting to come in for alcohol detox and wants to go from here to long term treatment. Has a hx of HTN but no recent outside medications. Reports he lives on his own. States most of family deceased but has two grown children with no communication. Reports no support system. BP slightly elevated 162/87. Patient placed on librium protocol. Cooperative with admission.

## 2014-05-05 NOTE — Progress Notes (Signed)
Adult Psychoeducational Group Note  Date:  05/05/2014 Time:  11:08 PM  Group Topic/Focus:  Wrap-Up Group:   The focus of this group is to help patients review their daily goal of treatment and discuss progress on daily workbooks.  Participation Level:  Did Not Attend  Participation Quality:  Drowsy  Affect:  Flat  Cognitive:  Confused  Insight: None  Engagement in Group:  None  Modes of Intervention:  None  Additional Comments:  Pt was not at [employer], Sharrieff Spratlin R 05/05/2014, 11:08 PM

## 2014-05-05 NOTE — Tx Team (Signed)
Initial Interdisciplinary Treatment Plan   PATIENT STRESSORS: Financial difficulties Substance abuse   PROBLEM LIST: Problem List/Patient Goals Date to be addressed Date deferred Reason deferred Estimated date of resolution  "detox from alcohol" 05/05/14     " I want long term treatment" 05/05/14                                                DISCHARGE CRITERIA:  Ability to meet basic life and health needs Improved stabilization in mood, thinking, and/or behavior Verbal commitment to aftercare and medication compliance  PRELIMINARY DISCHARGE PLAN: Outpatient therapy  PATIENT/FAMIILY INVOLVEMENT: This treatment plan has been presented to and reviewed with the patient, Ralph Jackson, and/or family member, .  The patient and family have been given the opportunity to ask questions and make suggestions.  Ralph Jackson [facility] 05/05/2014, 7:00 PM

## 2014-05-05 NOTE — ED Notes (Addendum)
All pt belongings to be sent with [facility] at discharge.

## 2014-05-05 NOTE — ED Provider Notes (Signed)
Medical screening examination/treatment/procedure(s) were performed by non-physician practitioner and as supervising physician I was immediately available for consultation/collaboration.    Vida Roller, MD 05/05/14 [phone]

## 2014-05-05 NOTE — ED Notes (Signed)
[facility] called. 

## 2014-05-05 NOTE — ED Notes (Signed)
Called report to Christa RN at [facility] and will call when [facility] is available to accept pt.

## 2014-05-06 ENCOUNTER — Encounter (HOSPITAL_COMMUNITY): Payer: Self-pay | Admitting: Psychiatry

## 2014-05-06 DIAGNOSIS — F1020 Alcohol dependence, uncomplicated: Principal | ICD-10-CM

## 2014-05-06 MED ORDER — LISINOPRIL 20 MG PO TABS
20.0000 mg | ORAL_TABLET | Freq: Once | ORAL | Status: DC
  Filled 2014-05-06 (×2): qty 1

## 2014-05-06 MED ORDER — LISINOPRIL 10 MG PO TABS: 10.0000 mg | ORAL_TABLET | Freq: Every day | ORAL | Status: DC

## 2014-05-06 MED ORDER — LISINOPRIL 10 MG PO TABS
10.0000 mg | ORAL_TABLET | Freq: Every day | ORAL | Status: DC
  Administered 2014-05-07 – 2014-05-13 (×6): 10 mg via ORAL
  Filled 2014-05-06 (×4): qty 1
  Filled 2014-05-06: qty 2
  Filled 2014-05-06 (×3): qty 1
  Filled 2014-05-06: qty 4
  Filled 2014-05-06: qty 1

## 2014-05-06 NOTE — BHH Counselor (Signed)
Adult Psychosocial Assessment Update Interdisciplinary Team  Previous [facility] admissions/discharges:  Admissions Discharges  Date: 03/24/14 Date:04/05/14  Date: Date:  Date: Date:  Date: Date:  Date: Date:   Changes since the last Psychosocial Assessment (including adherence to outpatient mental health and/or substance abuse treatment, situational issues contributing to decompensation and/or relapse). Patient reports that there have been no changes since his last admission. Patient reports that he lives in a boarding house in [place] and has been continuing to drink. He states that he will likely be able to return at discharge. He has not followed up with outpatient treatment. He reports that he is interested in a residential treatment center at this time. Patient provided CSW with permission to make referrals to [facility] and [facility].              Discharge Plan 1. Will you be returning to the same living situation after discharge?   Yes:X No:      If no, what is your plan?           2. Would you like a referral for services when you are discharged? Yes: X    If yes, for what services?  No:       Residential substance abuse treatment centers- [facility] and [facility]       Summary and Recommendations (to be completed by the evaluator) Patient is a 68 year old Caucasian Male with a diagnosis of Alcohol Dependence . Patient lives in [place] alone. Patient will benefit from crisis stabilization, medication evaluation, group therapy, and psycho education in addition to case management for discharge planning.                         Signature:  Haru Shaff, [place], 05/06/2014 11:24 AM

## 2014-05-06 NOTE — H&P (Signed)
Psychiatric Admission Assessment Adult  Patient Identification:  Ralph Jackson  Date of Evaluation:  05/06/2014  Chief Complaint:  ALCOHOL USE DISORDER   History of Present Illness: Mr. Ralph Jackson good was discharged from this hospital on 04/05/14 after alcohol detox. He reports, "I had a relapse on alcohol again. It happened about 2 weeks ago. I don't know the reason I relapsed. But, I have been drinking a lot of wine since then, about a fifth a day.I don't know why I drink so much alcohol. I'm not depressed and or anxious. I would want to to go to a long term substance abuse treatment when I get discharged from this hospital this time".  Elements:  Location:  Alcohol dependence. Quality:  Increased alcohol consumption. Severity:  Severe. Timing:  "I have beed drinking everyday x 14 days. Duration:  Chronic. Context:  "I have been sober for a while, relapsed 2 weeks ago, drinking daily since then. I don't know why I relapsed".  Associated Signs/Symptoms:  Depression Symptoms:  Denies  (Hypo) Manic Symptoms:  Impulsivity,  Anxiety Symptoms:  Denies  Psychotic Symptoms:  Denies  PTSD Symptoms: NA  Total Time spent with patient: 1 hour  Psychiatric Specialty Exam: Physical Exam  Constitutional: He is oriented to person, place, and time. He appears well-developed.  HENT:  Head: Normocephalic.  Eyes: Pupils are equal, round, and reactive to light.  Neck: Normal range of motion.  Cardiovascular: Normal rate.   Respiratory: Effort normal.  GI: Soft.  Musculoskeletal: Normal range of motion.  Neurological: He is alert and oriented to person, place, and time.  Skin: Skin is warm and dry.    Review of Systems  Constitutional: Positive for chills, weight loss, malaise/fatigue and diaphoresis.  Eyes: Negative.   Respiratory: Negative.   Cardiovascular: Negative.   Gastrointestinal: Positive for nausea.  Genitourinary: Negative.   Musculoskeletal: Positive for myalgias.   Skin: Negative.   Neurological: Positive for dizziness, seizures and weakness.  Endo/Heme/Allergies: Negative.   Psychiatric/Behavioral: Positive for substance abuse (Alcoholism, chronic). Negative for suicidal ideas, hallucinations and memory loss. The patient is not nervous/anxious and does not have insomnia.     Blood pressure 146/103, pulse 79, temperature 97.7 F (36.5 C), temperature source Oral, resp. rate 18, height _0  (1.499 m), weight 41.731 kg (92 lb), SpO2 98.00%.Body mass index is 18.57 kg/(m^2).  General Appearance: Disheveled  Eye Sport and exercise psychologist::  Fair  Speech:  Clear and Coherent  Volume:  Decreased  Mood:  Denies signs & symptoms of depression/anxiety  Affect:  Restricted  Thought Process:  Coherent and Intact  Orientation:  Full (Time, Place, and Person)  Thought Content:  Denies any hallucinations and or delusions  Suicidal Thoughts:  No  Homicidal Thoughts:  No  Memory:  Immediate;   Good Recent;   Good Remote;   Good  Judgement:  Fair  Insight:  Present  Psychomotor Activity:  Normal  Concentration:  Good  Recall:  [place] of Knowledge:Fair  Language: Fair  Akathisia:  No  Handed:  Right  AIMS (if indicated):     Assets:  Desire for Improvement  Sleep:  Number of Hours: 6.5   Musculoskeletal: Strength & Muscle Tone: within normal limits Gait & Station: normal Patient leans: N/A  Past Psychiatric History: Diagnosis: Alcohol dependence  Hospitalizations: [facility]  Outpatient Care: None reported  Substance Abuse Care: Was at the [facility] 6 years ago  Self-Mutilation: Denies  Suicidal Attempts: Denies  Violent Behaviors: Denies  Past Medical History:   Past Medical History  Diagnosis Date  . Hypertension   . Urinary tract infection   . ETOH abuse    Cardiac History:  HTN  Allergies:  No Known Allergies  PTA Medications: No prescriptions prior to admission   Previous Psychotropic Medications: Medication/Dose  See  medication lists               Substance Abuse History in the last 12 months:  Yes.    Consequences of Substance Abuse: Medical Consequences:  Liver damage, Possible death by overdose Legal Consequences:  Arrests, jail time, Loss of driving privilege. Family Consequences:  Family discord, divorce and or separation.  Social History:  reports that he has never smoked. He uses smokeless tobacco. He reports that he drinks alcohol. He reports that he does not use illicit drugs. Additional Social History: Current Place of Residence: [place], [place]    Place of Birth: [place]    Family Members: None reported  Marital Status:  Separated  Children: 2  Sons: 1  Daughters: 1  Relationships: Separated  Education:  [employer] Financial planner Problems/Performance: Completed high school  Religious Beliefs/Practices: NA  History of Abuse (Emotional/Phsycial/Sexual): denies  Occupational Experiences: Rtired  Hotel manager History:  None.  Legal History: Denies any pending legal charges  Hobbies/Interests: NA  Family History:  History reviewed. No pertinent family history.  Results for orders placed during the hospital encounter of 05/04/14 (from the past 72 hour(s))  ACETAMINOPHEN LEVEL     Status: None   Collection Time    05/04/14  6:03 PM      Result Value Ref Range   Acetaminophen (Tylenol), Serum <15.0  10 - 30 ug/mL   Comment:            THERAPEUTIC CONCENTRATIONS VARY     SIGNIFICANTLY. A RANGE OF 10-30     ug/mL MAY BE AN EFFECTIVE     CONCENTRATION FOR MANY PATIENTS.     HOWEVER, SOME ARE BEST TREATED     AT CONCENTRATIONS OUTSIDE THIS     RANGE.     ACETAMINOPHEN CONCENTRATIONS     >150 ug/mL AT 4 HOURS AFTER     INGESTION AND >50 ug/mL AT 12     HOURS AFTER INGESTION ARE     OFTEN ASSOCIATED WITH TOXIC     REACTIONS.  CBC     Status: Abnormal   Collection Time    05/04/14  6:03 PM      Result Value Ref Range   WBC 6.7  4.0 - 10.5 K/uL   RBC 4.14  (*) 4.22 - 5.81 MIL/uL   Hemoglobin 13.5  13.0 - 17.0 g/dL   HCT 73.5  32.9 - 92.4 %   MCV 95.7  78.0 - 100.0 fL   MCH 32.6  26.0 - 34.0 pg   MCHC 34.1  30.0 - 36.0 g/dL   RDW 26.8  34.1 - 96.2 %   Platelets 265  150 - 400 K/uL  COMPREHENSIVE METABOLIC PANEL     Status: Abnormal   Collection Time    05/04/14  6:03 PM      Result Value Ref Range   Sodium 143  137 - 147 mEq/L   Potassium 3.6 (*) 3.7 - 5.3 mEq/L   Chloride 101  96 - 112 mEq/L   CO2 29  19 - 32 mEq/L   Glucose, Bld 128 (*) 70 - 99 mg/dL   BUN 11  6 - 23 mg/dL  Creatinine, Ser 0.83  0.50 - 1.35 mg/dL   Calcium 8.6  8.4 - 10.5 mg/dL   Total Protein 6.6  6.0 - 8.3 g/dL   Albumin 3.2 (*) 3.5 - 5.2 g/dL   AST 24  0 - 37 U/L   ALT 22  0 - 53 U/L   Alkaline Phosphatase 143 (*) 39 - 117 U/L   Total Bilirubin 0.5  0.3 - 1.2 mg/dL   GFR calc non Af Amer 89 (*) >90 mL/min   GFR calc Af Amer >90  >90 mL/min   Comment: (NOTE)     The eGFR has been calculated using the CKD EPI equation.     This calculation has not been validated in all clinical situations.     eGFR's persistently <90 mL/min signify possible Chronic Kidney     Disease.   Anion gap 13  5 - 15  ETHANOL     Status: Abnormal   Collection Time    05/04/14  6:03 PM      Result Value Ref Range   Alcohol, Ethyl (B) 203 (*) 0 - 11 mg/dL   Comment:            LOWEST DETECTABLE LIMIT FOR     SERUM ALCOHOL IS 11 mg/dL     FOR MEDICAL PURPOSES ONLY  SALICYLATE LEVEL     Status: Abnormal   Collection Time    05/04/14  6:03 PM      Result Value Ref Range   Salicylate Lvl <6.6 (*) 2.8 - 20.0 mg/dL  URINE RAPID DRUG SCREEN (HOSP PERFORMED)     Status: Abnormal   Collection Time    05/04/14 11:17 PM      Result Value Ref Range   Opiates NONE DETECTED  NONE DETECTED   Cocaine NONE DETECTED  NONE DETECTED   Benzodiazepines POSITIVE (*) NONE DETECTED   Amphetamines NONE DETECTED  NONE DETECTED   Tetrahydrocannabinol NONE DETECTED  NONE DETECTED   Barbiturates NONE  DETECTED  NONE DETECTED   Comment:            DRUG SCREEN FOR MEDICAL PURPOSES     ONLY.  IF CONFIRMATION IS NEEDED     FOR ANY PURPOSE, NOTIFY LAB     WITHIN 5 DAYS.                LOWEST DETECTABLE LIMITS     FOR URINE DRUG SCREEN     Drug Class       Cutoff (ng/mL)     Amphetamine      1000     Barbiturate      200     Benzodiazepine   063     Tricyclics       016     Opiates          300     Cocaine          300     THC              50   Psychological Evaluations:  Assessment:   DSM5: Schizophrenia Disorders:  NA Obsessive-Compulsive Disorders:  NA Trauma-Stressor Disorders:  NA Substance/Addictive Disorders:  Alcohol Related Disorder - Severe (303.90) Depressive Disorders:  NA  AXIS I:  Alcohol dependence AXIS II:  Deferred AXIS III:   Past Medical History  Diagnosis Date  . Hypertension   . Urinary tract infection   . ETOH abuse    AXIS IV:  other psychosocial or environmental  problems and Alcoholism, chronic AXIS V:  1-10 persistent dangerousness to self and others present  Treatment Plan/Recommendations: 1. Admit for crisis management and stabilization, estimated length of stay 3-5 days.  2. Medication management to reduce current symptoms to base line and improve the patient's overall level of functioning; continue Librium detox protocols. 3. Treat health problems as indicated.  4. Develop treatment plan to decrease risk of relapse upon discharge and the need for readmission.  5. Psycho-social education regarding relapse prevention and self care.  6. Health care follow up as needed for medical problems, Initiate lisinopril 20 mg once, then 10 mg daily starting 05/07/14. 7. Review, reconcile, and reinstate any pertinent home medications for other health issues where appropriate. 8. Call for consults with hospitalist for any additional specialty patient care services as needed.  Treatment Plan Summary: Daily contact with patient to assess and evaluate symptoms  and progress in treatment Medication management  Current Medications:  Current Facility-Administered Medications  Medication Dose Route Frequency Provider Last Rate Last Dose  . acetaminophen (TYLENOL) tablet 650 mg  650 mg Oral Q6H PRN Durward Parcel, MD      . alum & mag hydroxide-simeth (MAALOX/MYLANTA) 200-200-20 MG/5ML suspension 30 mL  30 mL Oral Q4H PRN Durward Parcel, MD      . chlordiazePOXIDE (LIBRIUM) capsule 25 mg  25 mg Oral Q6H PRN Durward Parcel, MD      . chlordiazePOXIDE (LIBRIUM) capsule 25 mg  25 mg Oral QID Durward Parcel, MD   25 mg at 05/06/14 0855   Followed by  . [START ON 05/07/2014] chlordiazePOXIDE (LIBRIUM) capsule 25 mg  25 mg Oral TID Durward Parcel, MD       Followed by  . [START ON 05/08/2014] chlordiazePOXIDE (LIBRIUM) capsule 25 mg  25 mg Oral BH-qamhs Durward Parcel, MD       Followed by  . [START ON 05/10/2014] chlordiazePOXIDE (LIBRIUM) capsule 25 mg  25 mg Oral Daily Durward Parcel, MD      . hydrOXYzine (ATARAX/VISTARIL) tablet 25 mg  25 mg Oral Q6H PRN Durward Parcel, MD   25 mg at 05/05/14 2225  . loperamide (IMODIUM) capsule 2-4 mg  2-4 mg Oral PRN Durward Parcel, MD      . magnesium hydroxide (MILK OF MAGNESIA) suspension 30 mL  30 mL Oral Daily PRN Durward Parcel, MD      . multivitamin with minerals tablet 1 tablet  1 tablet Oral Daily Durward Parcel, MD   1 tablet at 05/06/14 0855  . ondansetron (ZOFRAN-ODT) disintegrating tablet 4 mg  4 mg Oral Q6H PRN Durward Parcel, MD      . thiamine (VITAMIN B-1) tablet 100 mg  100 mg Oral Daily Durward Parcel, MD   100 mg at 05/06/14 0855    Observation Level/Precautions:  15 minute checks  Laboratory:  Per ED  Psychotherapy:  Group sessions  Medications:  See medication lists  Consultations:  As needed  Discharge Concerns:  Maintaining sobriety  Estimated LOS: 2-4  days  Other:     I certify that inpatient services furnished can reasonably be expected to improve the patient's condition.   Encarnacion Slates, PMHNP-BC 9/4/201510:36 AM I personally assessed the patient, reviewed the physical exam and labs and formulated the treatment plan Geralyn Flash A. Sabra Heck, M.D.

## 2014-05-06 NOTE — Progress Notes (Signed)
D) Pt sitting in his bed. He is disheveled and has thick dirt under his nail beds. Rates his depression at a 4, his hopelessness at a 3 and his anxiety at an 8. States, "I just was drinking too much. My blood is showing that I have problems. Just came here to get dried out". Pt is unsteady on his feet but is alert and oriented. Refused his lisinopril because "I don't take that at home". Has been eating, with meals being taken to his room. Informed Pt that he needed a bath and Pt informed this writer "I will take it if I want to". Denies SI. Affect is flat and mood depressed. A) Encouraged Pt to talk but not pushing too much today due to withdrawing and feeling tired. Encouraged Pt to bathe and will assist Pt later in the shift. Encouraging fluids. R) Pt denies SI and HI. States his goal today is to "stay off alcohol.

## 2014-05-06 NOTE — [facility] Suicide Risk Assessment (Signed)
Suicide Risk Assessment  Admission Assessment     Nursing information obtained from:  Patient Demographic factors:  Male;Age 68 or older;Low socioeconomic status;Living alone Current Mental Status:  NA Loss Factors:  Loss of significant relationship Historical Factors:  NA Risk Reduction Factors:  Positive coping skills or problem solving skills Total Time spent with patient: 30 minutes  CLINICAL FACTORS:   Alcohol/Substance Abuse/Dependencies  Psychiatric Specialty Exam:     Blood pressure 146/103, pulse 79, temperature 97.7 F (36.5 C), temperature source Oral, resp. rate 18, height  (1.499 m), weight 41.731 kg (92 lb), SpO2 98.00%.Body mass index is 18.57 kg/(m^2).  General Appearance: Disheveled  Eye Solicitor::  Fair  Speech:  Clear and Coherent, Slow and not spontaneous  Volume:  Decreased  Mood:  Anxious  Affect:  Restricted  Thought Process:  Coherent and Goal Directed  Orientation:  Full (Time, Place, and Person)  Thought Content:  events, worries, concerns, wanting to go to rehab  Suicidal Thoughts:  No  Homicidal Thoughts:  No  Memory:  Immediate;   Fair Recent;   Fair Remote;   Fair  Judgement:  Fair  Insight:  Present and Shallow  Psychomotor Activity:  Restlessness  Concentration:  Fair  Recall:  [employer] of Knowledge:NA  Language: Fair  Akathisia:  No  Handed:    AIMS (if indicated):     Assets:  Desire for Improvement Housing  Sleep:  Number of Hours: 6.5   Musculoskeletal: Strength & Muscle Tone: within normal limits Gait & Station: normal Patient leans: N/A  COGNITIVE FEATURES THAT CONTRIBUTE TO RISK:  Closed-mindedness Polarized thinking Thought constriction (tunnel vision)    SUICIDE RISK:   Mild:  Suicidal ideation of limited frequency, intensity, duration, and specificity.  There are no identifiable plans, no associated intent, mild dysphoria and related symptoms, good self-control (both objective and subjective assessment), few  other risk factors, and identifiable protective factors, including available and accessible social support.  PLAN OF CARE: Supportive approach/coping skills/relapse prevention                              [facility]                              Reassess and address the co morbidities                              Explore placement options for residential treatment  I certify that inpatient services furnished can reasonably be expected to improve the patient's condition.  Alexx Mcburney A 05/06/2014, 2:13 PM

## 2014-05-06 NOTE — [facility] Suicide Risk Assessment (Signed)
[facility] INPATIENT:  Family/Significant Other Suicide Prevention Education  Suicide Prevention Education:  Patient Refusal for Family/Significant Other Suicide Prevention Education: The patient Ralph Jackson has refused to provide written consent for family/significant other to be provided Family/Significant Other Suicide Prevention Education during admission and/or prior to discharge.  Physician notified.  Tytionna Cloyd, [place] 05/06/2014, 11:29 AM

## 2014-05-06 NOTE — [facility] Group Notes (Signed)
[facility] LCSW Aftercare Discharge Planning Group Note  05/06/2014  8:45 AM  Participation Quality: Did Not Attend.  Erian Lariviere, MSW, LCSWA Clinical Social Worker [facility] [phone]   

## 2014-05-06 NOTE — Progress Notes (Signed)
Patient ID: Ralph Jackson, male   DOB: May 06, 1946, 68 y.o.   MRN: [id number] Pt resting in bed, easily awaken. Pt is fragile and weak.  Affect/mood anxious and depressed. Denies SI/HI . Verbally contracts for safety. -A/[place]. Denies pain or discomfort. Minimum interaction with staff. Pt monitored q , encouragedJEANMARC VIERNESs and medications given per physicians order.

## 2014-05-06 NOTE — Tx Team (Signed)
Interdisciplinary Treatment Plan Update (Adult)  Date: 05/06/2014  Time Reviewed: 9:30 AM  Progress in Treatment:  Attending groups: CSW continuing to assess; new to milieu  Participating in groups: CSW continuing to assess; new to milieu  Taking medication as prescribed: Yes  Tolerating medication: Yes  Family/Significant other contact made: CSW continuing to assess  Patient understands diagnosis: Yes  Discussing patient identified problems/goals with staff: Yes  Medical problems stabilized or resolved: Yes  Denies suicidal/homicidal ideation: CSW continuing to assess  Issues/concerns per patient self-inventory: Yes  Other:  New problem(s) identified: N/A  Discharge Plan or Barriers: CSW continuing to assess.Patient new to milieu.  Reason for Continuation of Hospitalization:  Depression  Anxiety  Medication Stabilization  Comments: N/A  Estimated length of stay: 3-5 days  For review of initial/current patient goals, please see plan of care.  Attendees:  Patient:    Family:    Physician: Dr. Dub Mikes  05/03/2014 9:30 AM   Nursing: Burnetta Sabin, RN  05/03/2014 9:30 AM   Clinical Social Worker: Belenda Cruise Izzac Rockett, LCSWA  05/03/2014 9:30 AM   Other: Juline Patch, LCSW  05/03/2014 9:30 AM   Other: Leisa Lenz, Vesta Mixer Liaison  05/03/2014 9:30 AM   Other: Onnie Boer, Case Manager  05/03/2014 9:30 AM   Other: Serena Colonel, NP  05/03/2014 9:30 AM   Other: Santa Genera, LCSW  05/03/2014 9:30 AM   Other:    Other:    Other:    Other:    Scribe for Treatment Team:  Samuella Bruin, MSW, [employer] [phone]

## 2014-05-06 NOTE — Progress Notes (Signed)
NUTRITION ASSESSMENT  Pt identified as at risk on the Malnutrition Screen Tool  INTERVENTION: 1. Educated patient on the importance of nutrition and encouraged intake of food and beverages. 2. Discussed weight goals.  NUTRITION DIAGNOSIS: Unintentional weight loss related to sub-optimal intake as evidenced by pt report.   Goal: Pt to meet >/= 90% of their estimated nutrition needs.  Monitor:  PO intake  Assessment:  Patient admitted with alcohol abuse. He denies changes in appetite recently, but his weight is down about 13 pounds from his usual weight (12% weight loss) over an unknown amount of time. He has evidence of severe muscle and subcutaneous fat depletion, likely secondary to alcohol abuse. He is currently eating well, observed eating 100% of his lunch in his room. He denies nutrition supplements at this time.   68 y.o. male  Height: Ht Readings from Last 1 Encounters:  05/05/14  (1.499 m)    Weight: Wt Readings from Last 1 Encounters:  05/05/14 92 lb (41.731 kg)    Weight Hx: Wt Readings from Last 10 Encounters:  05/05/14 92 lb (41.731 kg)  05/04/14 91 lb 8 oz (41.504 kg)  04/21/14 96 lb (43.545 kg)  03/24/14 95 lb (43.092 kg)  01/31/14 94 lb 1 oz (42.666 kg)    BMI:  Body mass index is 18.57 kg/(m^2). Pt meets criteria for normal weight based on current BMI.  Estimated Nutritional Needs: Kcal: 25-30 kcal/kg Protein: > 1 gram protein/kg Fluid: 1 ml/kcal  Diet Order: General Pt is also offered choice of unit snacks mid-morning and mid-afternoon.  Pt is eating as desired.   Lab results and medications reviewed.   Linnell Fulling, RD, LDN Pager #: [phone] After-Hours Pager #: [phone]

## 2014-05-06 NOTE — Progress Notes (Signed)
D   Pt has been in bed since admission    He appears frail and is very thin and looks much older than his stated age   He has some tremors and sweats   He denies SI/HI   He reports feeling bad and feeling to tired and weak to get out of bed    A   Verbal support given   Medications administered and effectiveness monitored   Q 15 min checks R   Pt is safe at present

## 2014-05-07 DIAGNOSIS — F429 Obsessive-compulsive disorder, unspecified: Secondary | ICD-10-CM

## 2014-05-07 NOTE — Plan of Care (Signed)
Problem: Alteration in mood & ability to function due to Goal: STG-Patient will attend groups Outcome: Not Progressing Patient did not attend group this evening.     

## 2014-05-07 NOTE — Plan of Care (Signed)
Problem: Ineffective individual coping Goal: STG: Patient will remain free from self harm Outcome: Not Progressing Patient has remained free from harm with 15 min checks in progress.

## 2014-05-07 NOTE — Tx Team (Signed)
Initial Interdisciplinary Treatment Plan   PATIENT STRESSORS: Substance abuse   PROBLEM LIST: Problem List/Patient Goals Date to be addressed Date deferred Reason deferred Estimated date of resolution  Alcoholism 05-07-14     Generalized weakness 05-07-14     Long term treatment goal 05-07-14                                          DISCHARGE CRITERIA:  Motivation to continue treatment in a less acute level of care  PRELIMINARY DISCHARGE PLAN: Attend aftercare/continuing care group  PATIENT/FAMIILY INVOLVEMENT: This treatment plan has been presented to and reviewed with the patient, Ralph Jackson, and/or family member, .  The patient and family have been given the opportunity to ask questions and make suggestions.  Dione Housekeeper 05/07/2014, 5:57 PM

## 2014-05-07 NOTE — Progress Notes (Signed)
D) Pt has been up and attending the groups this morning. Affect is flat and mood depressed. Pt states that he feels better than he did yesterday but has stayed in his bed this afternoon. States that he is still feeling a bit weak and just doesn't want to sit in the dayroom. Affect is flat and mood depressed. Rates his depression at 8 his hopelessness at a 2 and his anxiety at a 2. Denies SI and HI. A) Given support, reassurance and praise. Encouragement given. R) Pt denies SI and HI.

## 2014-05-07 NOTE — Clinical Social Work Note (Signed)
Clinical Social Work Note  At request of RN, CSW met with patient to discuss his thoughts about discharge.  He reports having been at both [facility] and [facility] in the past, and believes that both of those stays were over 30 days ago.  He does not believe that a 30-day stay is sufficient to significantly impact his 40-year alcohol addiction.  He would like to go to a 90-day program.  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  CSW told him about [facility] in [place], [place], and he expressed interest in pursuing that as an option for follow-up.  His biggest concern about going to a place so far away is whether he will be able to understand when to change buses, exactly where he is supposed to go at different times, and whether whoever picks him up on the other end will call his name loud enough for him to be picked up and not accidentally be left behind.  CSW informed him that this information would be passed to his regular CSW, who could provide detailed instructions both to the patient himself and to the receiving staff on the other end.  Selmer Dominion, LCSW 05/07/2014, 10:55 AM

## 2014-05-07 NOTE — Progress Notes (Signed)
Date: 05/07/2014  Time: 1015  Group Topic/Focus:  Identifying Needs: The focus of this group is to help patients identify their personal needs that have been historically problematic and identify healthy behaviors to address their needs.  Participation Level: Active  Participation Quality: Appropriate  Affect: Appropriate  Cognitive: Oriented  Insight: Improving  Engagement in Group: Engaged  Additional Comments: Participates in the conversation and is supportive of the peers in the group  Ralph Jackson  

## 2014-05-07 NOTE — Progress Notes (Signed)
Psychoeducational Group Note  Date:  05/07/2014 Time: 0048  Group Topic/Focus:  Wrap-Up Group:   The focus of this group is to help patients review their daily goal of treatment and discuss progress on daily workbooks.  Participation Level: Did Not Attend  Participation Quality:  Not Applicable  Affect:  Not Applicable  Cognitive:  Not Applicable  Insight:  Not Applicable  Engagement in Group: Not Applicable  Additional Comments:  The patient did not attend group since he was laying awake in his bed and was not feeling well.   Ferne Ellingwood S 05/07/2014, 12:47 AM

## 2014-05-07 NOTE — Progress Notes (Signed)
[facility] MD Progress Note  05/07/2014 3:59 PM Ralph Jackson  MRN:  [id number] Subjective:  Met with patient today in his room during group time. He states he has pain in his right flank that he woke up with this morning, states it's a 6/10, constant and doesn't radiate, has not had this before, no worsening factors. States it is a sharp stabbing pain, that does not radiate. He thinks it may be due to the mattress, but it wasn't severe enough to mention it to the nurse. States he doesn't have any withdrawal symptoms, he says he's not depressed, and his anxiety level is low.  CIWA is 2. Diagnosis:   DSM5: Schizophrenia Disorders:   Obsessive-Compulsive Disorders:   Trauma-Stressor Disorders:   Substance/Addictive Disorders:  Alcohol dependence Depressive Disorders:   Total Time spent with patient: 20 minutes    ADL's:  Impaired  Sleep: Good  Appetite:  Fair  Suicidal Ideation:  denies Homicidal Ideation:  denies AEB (as evidenced by):  Psychiatric Specialty Exam: Physical Exam  ROS  Blood pressure 123/77, pulse 78, temperature 97.4 F (36.3 C), temperature source Oral, resp. rate 18, height _0  (1.499 m), weight 41.731 kg (92 lb), SpO2 98.00%.Body mass index is 18.57 kg/(m^2).  General Appearance: Disheveled  Eye Contact::  Good  Speech:  Clear and Coherent  Volume:  Normal  Mood:  Euthymic  Affect:  Congruent  Thought Process:  Goal Directed  Orientation:  Full (Time, Place, and Person)  Thought Content:  WDL  Suicidal Thoughts:  No  Homicidal Thoughts:  No  Memory:  NA  Judgement:  Fair  Insight:  Shallow  Psychomotor Activity:  Normal  Concentration:  Fair  Recall:  NA  Fund of Knowledge:Fair  Language: Good  Akathisia:  No  Handed:  Right  AIMS (if indicated):     Assets:  Communication Skills Desire for Improvement  Sleep:  Number of Hours: 6.5   Musculoskeletal: Strength & Muscle Tone: within normal limits Gait & Station: normal Patient leans:  N/A  Current Medications: Current Facility-Administered Medications  Medication Dose Route Frequency Provider Last Rate Last Dose  . acetaminophen (TYLENOL) tablet 650 mg  650 mg Oral Q6H PRN Durward Parcel, MD      . alum & mag hydroxide-simeth (MAALOX/MYLANTA) 200-200-20 MG/5ML suspension 30 mL  30 mL Oral Q4H PRN Durward Parcel, MD      . chlordiazePOXIDE (LIBRIUM) capsule 25 mg  25 mg Oral Q6H PRN Durward Parcel, MD      . chlordiazePOXIDE (LIBRIUM) capsule 25 mg  25 mg Oral TID Durward Parcel, MD   25 mg at 05/07/14 1208   Followed by  . [START ON 05/08/2014] chlordiazePOXIDE (LIBRIUM) capsule 25 mg  25 mg Oral BH-qamhs Durward Parcel, MD       Followed by  . [START ON 05/10/2014] chlordiazePOXIDE (LIBRIUM) capsule 25 mg  25 mg Oral Daily Durward Parcel, MD      . hydrOXYzine (ATARAX/VISTARIL) tablet 25 mg  25 mg Oral Q6H PRN Durward Parcel, MD   25 mg at 05/05/14 2225  . lisinopril (PRINIVIL,ZESTRIL) tablet 10 mg  10 mg Oral Daily Encarnacion Slates, NP   10 mg at 05/07/14 0815  . lisinopril (PRINIVIL,ZESTRIL) tablet 20 mg  20 mg Oral Once Encarnacion Slates, NP      . loperamide (IMODIUM) capsule 2-4 mg  2-4 mg Oral PRN Durward Parcel, MD      . magnesium hydroxide (  MILK OF MAGNESIA) suspension 30 mL  30 mL Oral Daily PRN Durward Parcel, MD      . multivitamin with minerals tablet 1 tablet  1 tablet Oral Daily Durward Parcel, MD   1 tablet at 05/07/14 [phone]  . ondansetron (ZOFRAN-ODT) disintegrating tablet 4 mg  4 mg Oral Q6H PRN Durward Parcel, MD      . thiamine (VITAMIN B-1) tablet 100 mg  100 mg Oral Daily Durward Parcel, MD   100 mg at 05/07/14 0721    Lab Results: No results found for this or any previous visit (from the past 48 hour(s)).  Physical Findings: AIMS: Facial and Oral Movements Muscles of Facial Expression: None, normal Lips and Perioral Area: None,  normal Jaw: None, normal Tongue: None, normal,Extremity Movements Upper (arms, wrists, hands, fingers): None, normal Lower (legs, knees, ankles, toes): None, normal, Trunk Movements Neck, shoulders, hips: None, normal, Overall Severity Severity of abnormal movements (highest score from questions above): None, normal Incapacitation due to abnormal movements: None, normal Patient's awareness of abnormal movements (rate only patient's report): No Awareness, Dental Status Current problems with teeth and/or dentures?: No Does patient usually wear dentures?: No  CIWA:  CIWA-Ar Total: 2 COWS:     Treatment Plan Summary: Daily contact with patient to assess and evaluate symptoms and progress in treatment Medication management  Plan: 1. Patient is seen and the chart is reviewed. 2. He is encouraged to speak with his nurse about Tylenol, Ice or heat packs. 3. He is encouraged to use the shower for his discomfort and to walk around to try and stretch some. Medical Decision Making Problem Points:  Established problem, stable/improving (1) Data Points:  Review or order medicine tests (1)  I certify that inpatient services furnished can reasonably be expected to improve the patient's condition.  Marlane Hatcher. [place] RPAC 4:04 PM 05/07/2014  Patient seen, evaluated and I agree with notes by Nurse Practitioner. Corena Pilgrim, MD

## 2014-05-07 NOTE — [facility] Group Notes (Signed)
[facility] Group Notes:  (Nursing/MHT/Case Management/Adjunct)  Date:  05/07/2014  Time:  12:32 PM  Type of Therapy:  Psychoeducational Skills  Participation Level:  Active  Participation Quality:  Appropriate  Affect:  Appropriate  Cognitive:  Appropriate  Insight:  Appropriate  Engagement in Group:  Engaged  Modes of Intervention:  Discussion  Summary of Progress/Problems: Pt did attend self inventory group, pt reported that he was negative SI/HI, no AH/VH noted. Pt rated his depression as a 5, and his helplessness/hopelessness as a 0.     Pt reported no issues or concerns.   Jacquelyne Balint Shanta 05/07/2014, 12:32 PM

## 2014-05-07 NOTE — [facility] Group Notes (Signed)
[facility] Group Notes: (Clinical Social Work)   05/07/2014      Type of Therapy:  Group Therapy   Participation Level:  Did Not Attend    Irasema Chalk Grossman-Orr, LCSW 05/07/2014, 4:33 PM     

## 2014-05-08 MED ORDER — PNEUMOCOCCAL VAC POLYVALENT 25 MCG/0.5ML IJ INJ
0.5000 mL | INJECTION | INTRAMUSCULAR | Status: AC
  Administered 2014-05-09: 0.5 mL via INTRAMUSCULAR

## 2014-05-08 NOTE — Progress Notes (Signed)
Psychoeducational Group Note  Date:  05/07/2014 Time:  2000  Group Topic/Focus:  Wrap-Up Group:   The focus of this group is to help patients review their daily goal of treatment and discuss progress on daily workbooks.  Participation Level: Did Not Attend  Participation Quality:  Not Applicable  Affect:  Not Applicable  Cognitive:  Not Applicable  Insight:  Not Applicable  Engagement in Group: Not Applicable  Additional Comments: The patient didn't attend group since he was asleep in his room.   Ericia Moxley S 05/08/2014, 3:26 AM

## 2014-05-08 NOTE — Progress Notes (Signed)
D) Pt has spent most of the day in bed. Was asked and alert several times to get OOB and to come to group, but did not. Stated, "I don't feel good". Pt Given a bath, shaved and nail beds cleaned along with his linen being changed. Pt continues to state that he needs to stop drinking and to go o a long term facility where he can learn new coping skills and how to live his life without alcohol. Rates his depression at a 5 his hopelessness at a 6 and his anxiety at a 4. Denies SI A) Provided with a brief 1:1. Encouragement given for Pt to get OOB and to participate in the program. Pt given a bath, shaved and fluids and meals encouraged R) Pt denies SI and HI.

## 2014-05-08 NOTE — [facility] Group Notes (Signed)
[facility] Group Notes:  (Nursing/MHT/Case Management/Adjunct)  Date:  05/08/2014  Time:  2:00 PM  Type of Therapy:  Psychoeducational Skills- Patient Self Inventory Group  Participation Level:  Did Not Attend  Ralph Jackson 05/08/2014, 2:00 PM 

## 2014-05-08 NOTE — Progress Notes (Signed)
Writer observed patient lying in bed resting. He did not attend group this evening and voices no complaints. He request no medication and denies withdrawal symptoms. He denies si/hi/a/v hallucinations. Patient has call bell in reach and writer encouraged him to drink plenty of fluids on night stand. Safety maintained on unit with 15 min checks.

## 2014-05-08 NOTE — Progress Notes (Signed)
Psychoeducational Group Note  Date:  05/08/2014 Time:  1500 Group Topic/Focus:  Making Healthy Choices:   The focus of this group is to help patients identify negative/unhealthy choices they were using prior to admission and identify positive/healthier coping strategies to replace them upon discharge. Also teach relaxation breathing and progressive relaxation skills  Participation Level:  Did not attend  Danni Leabo A 05/08/2014  

## 2014-05-08 NOTE — Progress Notes (Signed)
Writer entered patients room and observed him lying in bed resting, he was easily aroused when his name was called. Patient reports that he was resting because he was feeling pretty weak. Writer refreshed and filled his pitcher with gatorade and encouraged him to drink plenty of fluids. Writer placed call bell within reach and encouraged him to call staff if needing to get up for the bathroom or in need of anything. Patient denies any withdrawal symptoms but writer did observe slight arm tremors when he was holding his pitcher. Patient received a prn of visteril for anxiety. Patient denies si/hi/a/v hallucinations. Support and encouragement given, safety maintained on unit with 15 min checks.

## 2014-05-08 NOTE — Progress Notes (Signed)
Patient ID: Ralph Jackson, male   DOB: Nov 30, 1945, 68 y.o.   MRN: [id number] [facility] MD Progress Note  05/08/2014 4:06 PM Ralph Jackson  MRN:  [id number] Subjective:  Met with patient today in his room during group time. He states that he feels "a little bit better." He reports the back pain has resolved, denies anxiety, denies depression and states only minimal "shakes" in his hands. He does report a little dizziness, but he is not concerned with this. He says he is eating ok, and sleeping well. Diagnosis:   DSM5: Schizophrenia Disorders:   Obsessive-Compulsive Disorders:   Trauma-Stressor Disorders:   Substance/Addictive Disorders:  Alcohol dependence Depressive Disorders:   Total Time spent with patient: 20 minutes    ADL's:  Impaired  Sleep: Good  Appetite:  Fair  Suicidal Ideation:  denies Homicidal Ideation:  denies AEB (as evidenced by):  Psychiatric Specialty Exam: Physical Exam  ROS  Blood pressure 102/63, pulse 82, temperature 97.4 F (36.3 C), temperature source Oral, resp. rate 16, height '4\' 11"'  (1.499 m), weight 41.731 kg (92 lb), SpO2 98.00%.Body mass index is 18.57 kg/(m^2).  General Appearance: continues to have poor hygiene  Eye Contact::  Good  Speech:  Clear and Coherent  Volume:  Normal  Mood:  Euthymic  Affect:  Congruent  Thought Process:  Goal Directed  Orientation:  Full (Time, Place, and Person)  Thought Content:  WDL  Suicidal Thoughts:  No  Homicidal Thoughts:  No  Memory:  NA  Judgement:  Fair  Insight:  Shallow  Psychomotor Activity:  Normal  Concentration:  Fair  Recall:  NA  Fund of Knowledge:Fair  Language: Good  Akathisia:  No  Handed:  Right  AIMS (if indicated):     Assets:  Communication Skills Desire for Improvement  Sleep:  Number of Hours: 6.25   Musculoskeletal: Strength & Muscle Tone: within normal limits Gait & Station: normal Patient leans: N/A  Current Medications: Current Facility-Administered  Medications  Medication Dose Route Frequency Provider Last Rate Last Dose  . acetaminophen (TYLENOL) tablet 650 mg  650 mg Oral Q6H PRN Durward Parcel, MD      . alum & mag hydroxide-simeth (MAALOX/MYLANTA) 200-200-20 MG/5ML suspension 30 mL  30 mL Oral Q4H PRN Durward Parcel, MD      . chlordiazePOXIDE (LIBRIUM) capsule 25 mg  25 mg Oral Q6H PRN Durward Parcel, MD      . chlordiazePOXIDE (LIBRIUM) capsule 25 mg  25 mg Oral BH-qamhs Durward Parcel, MD       Followed by  . [START ON 05/10/2014] chlordiazePOXIDE (LIBRIUM) capsule 25 mg  25 mg Oral Daily Durward Parcel, MD      . hydrOXYzine (ATARAX/VISTARIL) tablet 25 mg  25 mg Oral Q6H PRN Durward Parcel, MD   25 mg at 05/07/14 2123  . lisinopril (PRINIVIL,ZESTRIL) tablet 10 mg  10 mg Oral Daily Encarnacion Slates, NP   10 mg at 05/08/14 0909  . lisinopril (PRINIVIL,ZESTRIL) tablet 20 mg  20 mg Oral Once Encarnacion Slates, NP      . loperamide (IMODIUM) capsule 2-4 mg  2-4 mg Oral PRN Durward Parcel, MD      . magnesium hydroxide (MILK OF MAGNESIA) suspension 30 mL  30 mL Oral Daily PRN Durward Parcel, MD      . multivitamin with minerals tablet 1 tablet  1 tablet Oral Daily Durward Parcel, MD   1 tablet at 05/08/14 0909  .  ondansetron (ZOFRAN-ODT) disintegrating tablet 4 mg  4 mg Oral Q6H PRN Durward Parcel, MD      . Derrill Memo ON 05/09/2014] pneumococcal 23 valent vaccine (PNU-IMMUNE) injection 0.5 mL  0.5 mL Intramuscular Tomorrow-1000 Neita Garnet, MD      . thiamine (VITAMIN B-1) tablet 100 mg  100 mg Oral Daily Durward Parcel, MD   100 mg at 05/08/14 0909    Lab Results: No results found for this or any previous visit (from the past 48 hour(s)).  Physical Findings: AIMS: Facial and Oral Movements Muscles of Facial Expression: None, normal Lips and Perioral Area: None, normal Jaw: None, normal Tongue: None, normal,Extremity  Movements Upper (arms, wrists, hands, fingers): None, normal Lower (legs, knees, ankles, toes): None, normal, Trunk Movements Neck, shoulders, hips: None, normal, Overall Severity Severity of abnormal movements (highest score from questions above): None, normal Incapacitation due to abnormal movements: None, normal Patient's awareness of abnormal movements (rate only patient's report): No Awareness, Dental Status Current problems with teeth and/or dentures?: No Does patient usually wear dentures?: No  CIWA:  CIWA-Ar Total: 0 COWS:     Treatment Plan Summary: Daily contact with patient to assess and evaluate symptoms and progress in treatment Medication management  Plan: 1. Continue crisis management and stabilization. 2. Medication management to reduce current symptoms to base line and improve patient's overall level of functioning 3. Treat health problems as indicated. 4. Develop treatment plan to decrease risk of relapse upon discharge and the need for     readmission. 5. Psycho-social education regarding relapse prevention and self care. 6. Health care follow up as needed for medical problems. 7. Continue home medications where appropriate.  Medical Decision Making Problem Points:  Established problem, stable/improving (1) Data Points:  Review or order medicine tests (1)  I certify that inpatient services furnished can reasonably be expected to improve the patient's condition.  Marlane Hatcher. [place] RPAC 4:06 PM 05/08/2014  Patient seen, evaluated and I agree with notes by Nurse Practitioner. Corena Pilgrim, MD Patient seen, evaluated and I agree with notes by Nurse Practitioner. Corena Pilgrim, MD

## 2014-05-08 NOTE — [facility] Group Notes (Signed)
[facility] Group Notes: (Clinical Social Work)   05/08/2014      Type of Therapy:  Group Therapy   Participation Level:  Did Not Attend    Alexya Mcdaris Grossman-Orr, LCSW 05/08/2014, 2:55 PM     

## 2014-05-08 NOTE — [facility] Group Notes (Signed)
[facility] Group Notes:  Life Skills  Date:  05/08/2014  Time:  12:18 PM  Type of Therapy:  Nurse Education  Participation Level:  Did Not Attend  Participation Quality:  Inattentive  Affect:  Blunted  Cognitive:  Lacking  Insight:  None  Engagement in Group:  None  Modes of Intervention:  Discussion  Summary of Progress/Problems:  Nicole Cella 05/08/2014, 12:18 PM

## 2014-05-08 NOTE — Plan of Care (Signed)
Problem: Ineffective individual coping Goal: STG: Patient will remain free from self harm Outcome: Progressing Patient has remained free from harm as evidenced by 15 min checks.     

## 2014-05-09 DIAGNOSIS — F1020 Alcohol dependence, uncomplicated: Secondary | ICD-10-CM | POA: Diagnosis not present

## 2014-05-09 NOTE — [facility] Suicide Risk Assessment (Signed)
[facility] INPATIENT:  Family/Significant Other Suicide Prevention Education  Suicide Prevention Education:  Patient Refusal for Family/Significant Other Suicide Prevention Education: The patient Ralph Jackson has refused to provide written consent for family/significant other to be provided Family/Significant Other Suicide Prevention Education during admission and/or prior to discharge.  Physician notified.  Yee Gangi, [place] 05/09/2014, 1:17 PM

## 2014-05-09 NOTE — Progress Notes (Signed)
D:  Patient's self inventory sheet, patient slept good last night, no sleep medication given.  Fair appetite, low energy, poor concentration.  Denied depression, hopeless and anxiety.  Denied withdrawals.  Denied SI.  Denied physical pain.  Goal today is to stop drinking alcohol.  Plans to discharge to long term treatment plan.  No problems taking medications after discharge. A:  Medications administered per MD orders.  Emotional support and encouragement given patient. R:  Denied SI and HI, contracts for safety.  Denied A/V hallucinations.  Safety maintained with 15 minute checks.

## 2014-05-09 NOTE — Clinical Social Work Note (Signed)
CSW met with patient to discuss residential treatment options. CSW explained to patient that he is not eligible for [facility] due to not being dually diagnosed. CSW discussed option of [facility] or [facility] for long-term treatment and [facility] for short term. Patient agreeable to [facility] referrals but stated interest in long term options as well. CSW provided patient with application packet for [facility] and encouraged patient to begin working on it this afternoon. CSW to check back with patient tomorrow regarding any questions about application and to provide assistance as needed.  Tilden Fossa, MSW, [place] Worker [facility] [phone]

## 2014-05-09 NOTE — [facility] Group Notes (Signed)
   [facility] LCSW Aftercare Discharge Planning Group Note  05/09/2014  8:45 AM   Participation Quality: Alert, Appropriate and Oriented  Mood/Affect: Depressed and Flat  Depression Rating: 5  Anxiety Rating: 6  Thoughts of Suicide: Pt denies SI/HI  Will you contract for safety? Yes  Current AVH: Pt denies  Plan for Discharge/Comments: Pt attended discharge planning group and actively participated in group. CSW provided pt with today's workbook. Patient reports that he feels "alright" this afternoon and confirms his interest in residential treatment. CSW agreed to speak with patient individually after group to discuss long-term residential treatment options.  Transportation Means: CSW continuing to assess.  Supports: No supports mentioned at this time  Samuella Bruin, MSW, [employer] Clinical Social Worker [facility] [phone]

## 2014-05-09 NOTE — Progress Notes (Addendum)
Patient ID: Ralph Jackson, male   DOB: 1945-11-01, 68 y.o.   MRN: [id number] [facility] MD Progress Note  05/09/2014 5:44 PM HILMAR MOLDOVAN  MRN:  [id number] Subjective:  Patient states he feels "OK". He does state he feels dizzy at times, but denies falls. Objective: At this time does not endorse any ongoing significant withdrawal and does not appear to be in any acute distress or discomfort. Vitals are stable, no tremors, no diaphoresis. He is interested in going to an inpatient rehab program after discharge. He has been working with Child psychotherapist on potential options. Appetite is fair, energy level low but improving. Sleep is good. Denies medication side effects. Denies depression or anxiety, and does not endorse any acute  Or significant psychiatric symptoms at this time.  Spends a lot of time in his room, group attendance limited. Diagnosis:  Alcohol dependence.   Total Time spent with patient: 20 minutes    ADL's:  Fair   Sleep: Good  Appetite:  Fair  Suicidal Ideation:  denies Homicidal Ideation:  denies AEB (as evidenced by):  Psychiatric Specialty Exam: Physical Exam  Review of Systems  Constitutional: Negative for fever and chills.  Respiratory: Negative for cough and shortness of breath.   Cardiovascular: Negative for chest pain.  Gastrointestinal: Negative for nausea, vomiting, diarrhea, blood in stool and melena.  Skin: Negative for rash.  Psychiatric/Behavioral: Positive for substance abuse. Negative for depression and hallucinations. The patient is not nervous/anxious.     Blood pressure 98/65, pulse 98, temperature 97.5 F (36.4 C), temperature source Oral, resp. rate 18, height  (1.499 m), weight 41.731 kg (92 lb), SpO2 98.00%.Body mass index is 18.57 kg/(m^2).  General Appearance: Fairly Groomed  Patent attorney::  Good  Speech:  low volume  Volume:  Decreased  Mood:  Euthymic- denies feeling depressed  Affect:  Congruent  Thought Process:  Goal  Directed  Orientation:  Other:  fully alert and attentive, 0x3.  Thought Content:  denies hallucinations, no delusions   Suicidal Thoughts:  No- denies any SI or HI  Homicidal Thoughts:  No  Memory:  NA  Judgement:  Fair  Insight:  Shallow  Psychomotor Activity:  Decreased  Concentration:  Fair  Recall:  NA  Fund of Knowledge:Fair  Language: Good  Akathisia:  No  Handed:  Right  AIMS (if indicated):     Assets:  Communication Skills Desire for Improvement  Sleep:  Number of Hours: 6.25   Musculoskeletal: Strength & Muscle Tone: within normal limits Gait & Station: normal Patient leans: N/A  Current Medications: Current Facility-Administered Medications  Medication Dose Route Frequency Provider Last Rate Last Dose  . acetaminophen (TYLENOL) tablet 650 mg  650 mg Oral Q6H PRN Nehemiah Settle, MD      . alum & mag hydroxide-simeth (MAALOX/MYLANTA) 200-200-20 MG/5ML suspension 30 mL  30 mL Oral Q4H PRN Nehemiah Settle, MD      . chlordiazePOXIDE (LIBRIUM) capsule 25 mg  25 mg Oral BH-qamhs Nehemiah Settle, MD   25 mg at 05/09/14 7253   Followed by  . [START ON 05/10/2014] chlordiazePOXIDE (LIBRIUM) capsule 25 mg  25 mg Oral Daily Nehemiah Settle, MD      . lisinopril (PRINIVIL,ZESTRIL) tablet 10 mg  10 mg Oral Daily Sanjuana Kava, NP   10 mg at 05/09/14 6644  . lisinopril (PRINIVIL,ZESTRIL) tablet 20 mg  20 mg Oral Once Sanjuana Kava, NP      . magnesium hydroxide (MILK  OF MAGNESIA) suspension 30 mL  30 mL Oral Daily PRN Nehemiah Settle, MD      . multivitamin with minerals tablet 1 tablet  1 tablet Oral Daily Nehemiah Settle, MD   1 tablet at 05/09/14 0834  . thiamine (VITAMIN B-1) tablet 100 mg  100 mg Oral Daily Nehemiah Settle, MD   100 mg at 05/09/14 1324    Lab Results: No results found for this or any previous visit (from the past 48 hour(s)).  Physical Findings: AIMS: Facial and Oral Movements Muscles  of Facial Expression: None, normal Lips and Perioral Area: None, normal Jaw: None, normal Tongue: None, normal,Extremity Movements Upper (arms, wrists, hands, fingers): None, normal Lower (legs, knees, ankles, toes): None, normal, Trunk Movements Neck, shoulders, hips: None, normal, Overall Severity Severity of abnormal movements (highest score from questions above): None, normal Incapacitation due to abnormal movements: None, normal Patient's awareness of abnormal movements (rate only patient's report): No Awareness, Dental Status Current problems with teeth and/or dentures?: No Does patient usually wear dentures?: No  CIWA:  CIWA-Ar Total: 1 COWS:  COWS Total Score: 3  Assessment: Patient not presenting with any significant withdrawal at this time, as he completes librium detox protocol. Denies psychiatric ( mood/Anxiety) symptoms and presents euthymic. Motivation in milieu limited, spends most time in bed.   Treatment Plan Summary: Daily contact with patient to assess and evaluate symptoms and progress in treatment Medication management  Plan: Continue Librium detox, which he is completing tomorrow. On no psychiatric medications at this time. I suspect  Deconditioning may be significant and  contributing to isolation- will request PT consult.  Medical Decision Making Problem Points:  Established problem, stable/improving (1), Review of last therapy session (1) and Review of psycho-social stressors (1) Data Points:  Review of medication regiment & side effects (2)  I certify that inpatient services furnished can reasonably be expected to improve the patient's condition.

## 2014-05-09 NOTE — Progress Notes (Signed)
Psychoeducational Group Note  Date:  05/09/2014 Time:  0130  Group Topic/Focus:  Wrap-Up Group:   The focus of this group is to help patients review their daily goal of treatment and discuss progress on daily workbooks.  Participation Level: Did Not Attend  Participation Quality:  Not Applicable  Affect:  Not Applicable  Cognitive:  Not Applicable  Insight:  Not Applicable  Engagement in Group: Not Applicable  Additional Comments:  The patient did not attend group this evening since he was asleep in his bed.   Quintavious Rinck S 05/09/2014, 1:30 AM

## 2014-05-10 DIAGNOSIS — F1994 Other psychoactive substance use, unspecified with psychoactive substance-induced mood disorder: Secondary | ICD-10-CM

## 2014-05-10 DIAGNOSIS — F1910 Other psychoactive substance abuse, uncomplicated: Secondary | ICD-10-CM

## 2014-05-10 NOTE — Progress Notes (Addendum)
D:  Patient's self inventory sheet, patient sleeps good, no sleep medication given.  Good appetite, normal energy level, good concentration.  Rated depression 4, hopeless 5, anxiety 3.  Cravings for alcohol continue.  Denied SI.  Denied physical problems.  Denied physical pain.  Goal is to quit drinking alcohol.  No desire to drink alcohol. A:  Medications administered per MD orders.  Emotional support and encouragement given patient. R:  Denied SI and HI.  Denied A/V hallucinations.  Safety maintained with 15 minute checks.  Patient has been using rolling walker this afternoon.  Patient has drank total of 1320 cc today from 0600 to 1730.   Urinated several times.  Patient stated he is feeling better today.  Has been up walking to dayroom several times.

## 2014-05-10 NOTE — Progress Notes (Signed)
Patient ID: Ralph Jackson, male   DOB: 1946-03-03, 68 y.o.   MRN: [id number] PER STATE REGULATIONS 482.30  THIS CHART WAS REVIEWED FOR MEDICAL NECESSITY WITH RESPECT TO THE PATIENT'S ADMISSION/DURATION OF STAY.  NEXT REVIEW DATE: 05/12/14  Loura Halt, RN, BSN CASE MANAGER

## 2014-05-10 NOTE — Progress Notes (Signed)
Patient ID: Ralph Jackson, male   DOB: 1946-04-01, 68 y.o.   MRN: [id number] PER STATE REGULATIONS 482.30  THIS CHART WAS REVIEWED FOR MEDICAL NECESSITY WITH RESPECT TO THE PATIENT'S ADMISSION/DURATION OF STAY.  NEXT REVIEW DATE:05/08/14  Loura Halt, RN, BSN CASE MANAGER

## 2014-05-10 NOTE — Progress Notes (Signed)
[facility] MD Progress Note  05/10/2014 5:06 PM Ralph Jackson  MRN:  [id number] Subjective:  Ralph Jackson states that this time around he is planning to pursue rehab. Admits that he has tried to make it on his own but  he has not been able to do it, so he is going to follow the recommendations that were given to him to pursue rehab. States that he drinks and gets down depressed after he drinkg Diagnosis:   DSM5: Substance/Addictive Disorders:  Alcohol Related Disorder - Severe (303.90) Total Time spent with patient: 30 minutes  Axis I: Substance Induced Mood Disorder  ADL's:  Intact  Sleep: Fair  Appetite:  Fair Psychiatric Specialty Exam: Physical Exam  Review of Systems  Constitutional: Positive for malaise/fatigue.  HENT: Negative.   Eyes: Negative.   Respiratory: Negative.   Cardiovascular: Negative.   Gastrointestinal: Negative.   Genitourinary: Negative.   Musculoskeletal: Negative.   Skin: Negative.   Neurological: Positive for weakness.  Endo/Heme/Allergies: Negative.   Psychiatric/Behavioral: Positive for substance abuse.    Blood pressure 104/70, pulse 85, temperature 98.2 F (36.8 C), temperature source Oral, resp. rate 18, height  (1.499 m), weight 41.731 kg (92 lb), SpO2 98.00%.Body mass index is 18.57 kg/(m^2).  General Appearance: Fairly Groomed  Patent attorney::  Fair  Speech:  Clear and Coherent and Slow  Volume:  Decreased  Mood:  worried  Affect:  Restricted and worried  Thought Process:  Coherent and Goal Directed  Orientation:  Full (Time, Place, and Person)  Thought Content:  worries concerns   Suicidal Thoughts:  No  Homicidal Thoughts:  No  Memory:  Immediate;   Fair Recent;   Fair Remote;   Fair  Judgement:  Fair  Insight:  Present  Psychomotor Activity:  Decreased  Concentration:  Fair  Recall:  [employer] of Knowledge:NA  Language: Fair  Akathisia:  No  Handed:    AIMS (if indicated):     Assets:  Desire for Improvement Housing   Sleep:  Number of Hours: 5.5   Musculoskeletal: Strength & Muscle Tone: decreased Gait & Station: unsteady Patient leans: N/A  Current Medications: Current Facility-Administered Medications  Medication Dose Route Frequency Provider Last Rate Last Dose  . acetaminophen (TYLENOL) tablet 650 mg  650 mg Oral Q6H PRN Nehemiah Settle, MD      . alum & mag hydroxide-simeth (MAALOX/MYLANTA) 200-200-20 MG/5ML suspension 30 mL  30 mL Oral Q4H PRN Nehemiah Settle, MD      . lisinopril (PRINIVIL,ZESTRIL) tablet 10 mg  10 mg Oral Daily Sanjuana Kava, NP   10 mg at 05/10/14 0807  . lisinopril (PRINIVIL,ZESTRIL) tablet 20 mg  20 mg Oral Once Sanjuana Kava, NP      . magnesium hydroxide (MILK OF MAGNESIA) suspension 30 mL  30 mL Oral Daily PRN Nehemiah Settle, MD      . multivitamin with minerals tablet 1 tablet  1 tablet Oral Daily Nehemiah Settle, MD   1 tablet at 05/10/14 [phone]  . thiamine (VITAMIN B-1) tablet 100 mg  100 mg Oral Daily Nehemiah Settle, MD   100 mg at 05/10/14 6213    Lab Results: No results found for this or any previous visit (from the past 48 hour(s)).  Physical Findings: AIMS: Facial and Oral Movements Muscles of Facial Expression: None, normal Lips and Perioral Area: None, normal Jaw: None, normal Tongue: None, normal,Extremity Movements Upper (arms, wrists, hands, fingers): None, normal Lower (legs, knees, ankles,  toes): None, normal, Trunk Movements Neck, shoulders, hips: None, normal, Overall Severity Severity of abnormal movements (highest score from questions above): None, normal Incapacitation due to abnormal movements: None, normal Patient's awareness of abnormal movements (rate only patient's report): No Awareness, Dental Status Current problems with teeth and/or dentures?: No Does patient usually wear dentures?: No  CIWA:  CIWA-Ar Total: 2 COWS:  COWS Total Score: 4  Treatment Plan Summary: Daily contact with  patient to assess and evaluate symptoms and progress in treatment Medication management  Plan: Supportive approach/coping skills/relapse prevention           Facilitate admission to a residential treatment facility             Medical Decision Making Problem Points:  Review of psycho-social stressors (1) Data Points:  Review of new medications or change in dosage (2)  I certify that inpatient services furnished can reasonably be expected to improve the patient's condition.   Maryana Pittmon A 05/10/2014, 5:06 PM

## 2014-05-10 NOTE — Progress Notes (Signed)
Recreation Therapy Notes   Animal-Assisted Activity/Therapy (AAA/T) Program Checklist/Progress Notes  Patient Eligibility Criteria Checklist & Daily Group note for Rec Tx Intervention  Date: 09.08.2015 Time: 2:45pm Location: [address]   AAA/T Program Assumption of Risk Form signed by Patient/ or Parent Legal Guardian Yes  Patient is free of allergies or sever asthma  Yes  Patient reports no fear of animals Yes  Patient reports no history of cruelty to animals Yes   Patient understands his/her participation is voluntary Yes  Patient washes hands before animal contact Yes  Patient washes hands after animal contact Yes  Behavioral Response: Engaged, Appropriate   Education: Communication, Charity fundraiser, Appropriate Animal Interaction   Education Outcome: Acknowledges education    Clinical Observations/Feedback:  Patient interacted appropriately with therapeutic dog team, petting therapy dog appropriately.  Marykay Lex Mosie Angus, LRT/CTRS  Mason Burleigh L 05/10/2014 4:10 PM

## 2014-05-10 NOTE — Evaluation (Signed)
Physical Therapy Evaluation Patient Details Name: Ralph Jackson MRN: [id number] DOB: [birth year] Today's Date: 05/10/2014   History of Present Illness  68 year old male who was  admitted to [facility] for alcohol abuse.on 05/05/14,  Clinical Impression  Pt ambulating on unit, holding to rails and objects. Berg balance indicative of fall but did well with use of 4 wheeled RW on unit. Pt will beneft from PT to address balance and  Need for DME.    Follow Up Recommendations Supervision for mobility/OOB    Equipment Recommendations   (4 wheeled RW)    Recommendations for Other Services       Precautions / Restrictions Precautions Precautions: Fall      Mobility  Bed Mobility                  Transfers Overall transfer level: Modified independent Equipment used: None Transfers: Sit to/from Stand Sit to Stand: Supervision;Modified independent (Device/Increase time)         General transfer comment: unsteady upon standing, reports he walks and holds to rail and objects on unit  Ambulation/Gait Ambulation/Gait assistance: Supervision (distant from staff) Ambulation Distance (Feet): 200 Feet Assistive device: Rolling walker (2 wheeled) Gait Pattern/deviations: Step-through pattern;Staggering left;Staggering right;Decreased weight shift to right;Decreased weight shift to left;Trunk flexed;Wide base of support     General Gait Details: verbal cues for safe use of RW, feels more steady with use of RW, @ wheeled RW appeared more unsafe with turns. A 4 wheeled RW was on unit, patient did better with turns and in small spaces.  Stairs            Wheelchair Mobility    Modified Rankin (Stroke Patients Only)       Balance                                 Standardized Balance Assessment Standardized Balance Assessment : Berg Balance Test Berg Balance Test Sit to Stand: Able to stand without using hands and stabilize independently Standing  Unsupported: Able to stand safely 2 minutes Sitting with Back Unsupported but Feet Supported on Floor or Stool: Able to sit safely and securely 2 minutes Stand to Sit: Uses backs of legs against chair to control descent Transfers: Able to transfer safely, definite need of hands Standing Unsupported with Eyes Closed: Able to stand 10 seconds with supervision Standing Ubsupported with Feet Together: Needs help to attain position and unable to hold for 15 seconds From Standing, Reach Forward with Outstretched Arm: Can reach forward >12 cm safely (5") From Standing Position, Pick up Object from Floor: Able to pick up shoe, needs supervision From Standing Position, Turn to Look Behind Over each Shoulder: Looks behind from both sides and weight shifts well Turn 360 Degrees: Able to turn 360 degrees safely but slowly Standing Unsupported, Alternately Place Feet on Step/Stool: Needs assistance to keep from falling or unable to try Standing Unsupported, One Foot in Front: Loses balance while stepping or standing Standing on One Leg: Unable to try or needs assist to prevent fall Total Score: 32         Pertinent Vitals/Pain Pain Assessment: No/denies pain    Home Living Family/patient expects to be discharged to:: Unsure (facility for ETOH) Living Arrangements: Alone                    Prior Function Level of Independence: Independent  Hand Dominance        Extremity/Trunk Assessment   Upper Extremity Assessment: Generalized weakness           Lower Extremity Assessment: Generalized weakness         Communication      Cognition Arousal/Alertness: Awake/alert Behavior During Therapy: Flat affect Overall Cognitive Status: Within Functional Limits for tasks assessed                      General Comments General comments (skin integrity, edema, etc.): per score, patient is a risk for fall. Use of an assistive device mayhelp prevent fall.     Exercises        Assessment/Plan    PT Assessment Patient needs continued PT services  PT Diagnosis Difficulty walking;Generalized weakness   PT Problem List Decreased strength;Decreased balance;Decreased activity tolerance;Decreased mobility;Decreased knowledge of use of DME  PT Treatment Interventions DME instruction;Gait training;Functional mobility training;Patient/family education;Wheelchair mobility training;Therapeutic activities;Therapeutic exercise;Balance training;Neuromuscular re-education   PT Goals (Current goals can be found in the Care Plan section) Acute Rehab PT Goals Patient Stated Goal: to walk better, not fall PT Goal Formulation: With patient Time For Goal Achievement: 05/24/14 Potential to Achieve Goals: Good    Frequency Min 2X/week   Barriers to discharge        Co-evaluation               End of Session   Activity Tolerance: Patient tolerated treatment well Patient left:  (in group room with 4 wheeled RW) Nurse Communication: Mobility status         Time: 4098-1191 PT Time Calculation (min): 29 min   Charges:   PT Evaluation $Initial PT Evaluation Tier I: 1 Procedure PT Treatments $Gait Training: 23-37 mins   PT G Codes:          Rada Hay 05/10/2014, 6:44 PM Blanchard Kelch PT [phone]

## 2014-05-10 NOTE — Progress Notes (Signed)
The focus of this group is to educate the patient on the purpose and policies of crisis stabilization and provide a format to answer questions about their admission.  The group details unit policies and expectations of patients while admitted.  Patient attended 0900 nurse education orientation group this morning.  Patient actively participated, appropriate affect, alert, appropriate insight and engagement.  Today patient will work on 3 goals for discharge.  

## 2014-05-10 NOTE — Progress Notes (Signed)
Adult Psychoeducational Group Note  Date:  05/10/2014 Time:  10:26 PM  Group Topic/Focus:  Wrap-Up Group:   The focus of this group is to help patients review their daily goal of treatment and discuss progress on daily workbooks.  Participation Level:  Did Not Attend  Participation Quality:  Drowsy  Affect:  Flat  Cognitive:  Disorganized  Insight: Lacking  Engagement in Group:  Poor  Modes of Intervention:  Support  Additional Comments:  Pt was not in group..the patient decided to stay in bed during group   Mike Berntsen R 05/10/2014, 10:26 PM

## 2014-05-10 NOTE — Clinical Social Work Note (Signed)
With patient permission, patient information faxed to [facility] for review for waitlist if appropriate.  Samuella Bruin, MSW, [employer] Clinical Social Worker [facility] [phone]

## 2014-05-10 NOTE — Tx Team (Signed)
Interdisciplinary Treatment Plan Update (Adult) Date: 05/10/2014   Time Reviewed: 9:30 AM  Progress in Treatment: Attending groups: Minimal Participating in groups: Minimal Taking medication as prescribed: Yes Tolerating medication: Yes Family/Significant other contact made: Patient has declined for CSW to make contact with family/support person Patient understands diagnosis: Yes Discussing patient identified problems/goals with staff: Yes Medical problems stabilized or resolved: Yes Denies suicidal/homicidal ideation: Yes Issues/concerns per patient self-inventory: Yes Other:  New problem(s) identified: N/A  Discharge Plan or Barriers: CSW continuing to assess. Patient interested in residential treatment. Treatment team believes that [facility] or [facility] are appropriate treatment options as patient may not be able to engage in physical work required for [employer].  Reason for Continuation of Hospitalization:  Depression Anxiety Medication Stabilization   Comments: N/A  Estimated length of stay: 2-3 days  For review of initial/current patient goals, please see plan of care.  Attendees: Patient:    Family:    Physician: Dr. Jama Flavors; Dr. Dub Mikes 05/03/2014 9:30 AM  Nursing: Neill Loft, Arville Lime., RN 05/03/2014 9:30 AM  Clinical Social Worker: Samuella Bruin,  LCSWA 05/03/2014 9:30 AM  Other: Juline Patch, LCSW 05/03/2014 9:30 AM  Other: Leisa Lenz, Vesta Mixer Liaison 05/03/2014 9:30 AM  Other: Onnie Boer, Case Manager 05/03/2014 9:30 AM     Other:    Other:    Other:    Other:    Other:        Scribe for Treatment Team:  Samuella Bruin, MSW, [employer] [phone]

## 2014-05-10 NOTE — Progress Notes (Signed)
D   Pt stayed in bed all shift   He did not attend group    His blood pressure is running low and pt has been given fluids and encouraged to drink them   He said he was feeling ok just continues to feel weak  He has some mild tremors  A   Verbal support given   Medications administered and effectiveness monitored   Q 15 min checks R   Pt safe at present

## 2014-05-11 NOTE — [facility] Group Notes (Signed)
[facility] LCSW Group Therapy  05/11/2014   1:15 PM   Type of Therapy:  Group Therapy  Participation Level:  Actively listened  Participation Quality:  Attentive and Supportive  Affect:  Depressed and Flat  Cognitive:  Alert and Oriented  Insight:  Developing/Improving and Engaged  Engagement in Therapy:  Developing/Improving and Engaged  Modes of Intervention:  Clarification, Confrontation, Discussion, Education, Exploration, Limit-setting, Orientation, Problem-solving, Rapport Building, Dance movement psychotherapist, Socialization and Support  Summary of Progress/Problems: The topic for group therapy was feelings about diagnosis.  Pt actively participated in group discussion on their past and current diagnosis and how they feel towards this.  Pt also identified how society and family members judge them, based on their diagnosis as well as stereotypes and stigmas.  Patient actively listened to discussion but did not want to share today.  Samuella Bruin, MSW, [employer] Clinical Social Worker [facility] [phone]

## 2014-05-11 NOTE — Clinical Social Work Note (Signed)
[facility] and [facility] continuing to review referral.  Samuella Bruin, MSW, LCSWA Clinical Social Worker [facility] [phone]

## 2014-05-11 NOTE — Progress Notes (Signed)
Pt has been up and active in the milieu today.  Pt rated his depression 4 hopelessness a 3 and anxiety a 6 on his self-inventory.  He hopes to go to [facility] for further treatment.  He is incontinent at times to both urinary and bowels.  He was moved into another room and was placed on do not admit. Pt did not make a goal today except to "stop alcohol" and did not place any way to meet his goal except go to another facility. He denied any S/H ideation or A/V/H

## 2014-05-11 NOTE — Progress Notes (Signed)
PT Cancellation Note  Patient Details Name: Ralph Jackson MRN: [id number] DOB: Dec 22, 1945   Cancelled Treatment:    Reason Eval/Treat Not Completed: Patient declined, (pt states that he wants to sleep. Pt curled up in fetal position , in a dark room and covers over his head.)   Rada Hay 05/11/2014, 4:56 PM Blanchard Kelch PT [phone]

## 2014-05-11 NOTE — Progress Notes (Signed)
Did not attend group 

## 2014-05-11 NOTE — Clinical Social Work Note (Signed)
CSW spoke with patient regarding [facility] bed availability on Oct. 19th, as well as discussed [employer]. Patient is open to both as a back-up to [facility]. [facility] is not likely a placement option due to difficulty ambulating. CSW assisted patient in calling local [employer].  CSW left voicemail for [facility] Admissions Coordinator regarding referral status, awaiting return call.  Samuella Bruin, MSW, [employer] Clinical Social Worker [facility] [phone]

## 2014-05-11 NOTE — Progress Notes (Signed)
Nursing 1:1 note D:Pt observed sleeping in bed with eyes closed. RR even and unlabored. No distress noted. A: 1:1 observation continues for safety  R: pt remains safe  

## 2014-05-11 NOTE — [facility] Group Notes (Signed)
   [facility] LCSW Aftercare Discharge Planning Group Note  05/11/2014  8:45 AM   Participation Quality: Alert, Appropriate and Oriented  Mood/Affect: Depressed and Flat  Depression Rating: 4  Anxiety Rating: 5  Thoughts of Suicide: Pt denies SI/HI  Will you contract for safety? Yes  Current AVH: Pt denies  Plan for Discharge/Comments: Pt attended discharge planning group and actively participated in group. CSW provided pt with today's workbook. Patient reports that he feels "pretty good" this morning and continues to request residential treatment. Referrals have been sent to [facility] and [facility].  Transportation Means: No transportation at this time  Supports: No supports mentioned at this time  Samuella Bruin, MSW, [employer] Clinical Social Worker [employer] [phone]

## 2014-05-11 NOTE — [facility] Group Notes (Signed)
(  LATE ENTRY)             [facility] LCSW Group Therapy 05/10/14  1:15 pm  Type of Therapy: Group Therapy Participation Level: Active  Participation Quality: Attentive, Sharing and Supportive  Affect: Depressed and Flat  Cognitive: Alert and Oriented  Insight: Developing/Improving and Engaged  Engagement in Therapy: Developing/Improving and Engaged  Modes of Intervention: Clarification, Confrontation, Discussion, Education, Exploration,  Limit-setting, Orientation, Problem-solving, Rapport Building, Dance movement psychotherapist, Socialization and Support  Summary of Progress/Problems: Pt identified obstacles faced currently and processed barriers involved in overcoming these obstacles. Pt identified steps necessary for overcoming these obstacles and explored motivation (internal and external) for facing these difficulties head on. Pt further identified one area of concern in their lives and chose a goal to focus on for today. Patient reports that his primary obstacle is to quit drinking because he realizes that "it could kill me". Patient discusses that he is interested in residential treatment at [facility] if possible. CSW and patient discussed importance of strong support system through AA/sponsor. CSW's provided emotional support and encouragement.  Samuella Bruin, MSW, [employer] Clinical Social Worker [facility] [phone]

## 2014-05-12 DIAGNOSIS — F1010 Alcohol abuse, uncomplicated: Secondary | ICD-10-CM

## 2014-05-12 NOTE — [facility] Group Notes (Signed)
[facility] LCSW Group Therapy  05/12/2014 2:57 PM   Type of Therapy:  Group Therapy  Participation Level:  Did not attend  Summary of Progress/Problems: Today's group focused on relapse prevention.  We defined the term, and then brainstormed on ways to prevent relapse.  Daryel Gerald B 05/12/2014 , 2:57 PM

## 2014-05-12 NOTE — Tx Team (Signed)
  Interdisciplinary Treatment Plan Update   Date Reviewed:  05/12/2014  Time Reviewed:  3:55 PM  Progress in Treatment:   Attending groups: No Participating in groups: No Taking medication as prescribed: Yes  Tolerating medication: Yes Family/Significant other contact made: Yes  Patient understands diagnosis: Yes  Discussing patient identified problems/goals with staff: Yes  See initial care plan Medical problems stabilized or resolved: Yes Denies suicidal/homicidal ideation: Yes  In tx team Patient has not harmed self or others: Yes  For review of initial/current patient goals, please see plan of care.  Estimated Length of Stay:  3-5 days  Reason for Continuation of Hospitalization: Depression Medication stabilization Withdrawal symptoms  New Problems/Goals identified:  N/A  Discharge Plan or Barriers:   wants to get into rehab from here.  Has date at [facility] in Oct.  We are working on getting him into temporary safe place in the meantime.  Additional Comments:  Patient states that he is here for alcohol related problems and reports he has no new complaints today.  Patient found in bed ,appears to be less anxious and denies any SI/HI/AH/VH. Reports sleep as average and appetite as OK.  Denies any side effects of medications. Patient is willing to pursue rehab.   Attendees:  Signature: Ivin Booty, MD 05/12/2014 3:55 PM   Signature: Richelle Ito, LCSW 05/12/2014 3:55 PM  Signature: Fransisca Kaufmann, NP 05/12/2014 3:55 PM  Signature: Joslyn Devon, RN 05/12/2014 3:55 PM  Signature: Liborio Nixon, RN 05/12/2014 3:55 PM  Signature:  05/12/2014 3:55 PM  Signature:   05/12/2014 3:55 PM  Signature:    Signature:    Signature:    Signature:    Signature:    Signature:      Scribe for Treatment Team:   Richelle Ito, LCSW  05/12/2014 3:55 PM

## 2014-05-12 NOTE — Progress Notes (Addendum)
Pt appears a little dishelved . He is steady on his feet and contracts for safety. Pt remains a do not admit and will be going to [facility] next month. He does not appear to have tremors this am and does not c/o feeling anxious. Pt was not given his lisonopril due to a low Blood pressure. He is cooperative and pleasant and has been in his room this am.

## 2014-05-12 NOTE — Progress Notes (Signed)
D Pt.  Denies SI and HI, no complaints of pain or discomfort noted.  A Writer offered support and encouragement,  Attempted to discuss discharge plans with pt.  R Pt. Was asleep, writer did awaken the pt. For an assessment, pt. Was not forthcoming with answers just replied that he wanted to sleep.  Pt. Remains in bed most of the day. Bp was 105/62 pulse 61. Pt. Is incontinent at times writer attempted to get the pt. To go to the bathroom during the assessment but pt. Refused. Pt.  Remains safe on the unit.

## 2014-05-12 NOTE — H&P (Signed)
Case discussed, agree with plan 

## 2014-05-12 NOTE — Clinical Social Work Note (Signed)
Patient declined at [facility] due to several previous residential stays at local agencies as well as being a client at [employer].  Patient has bed scheduled at [facility] in [place] on October 19th for residential treatment, patient is agreeable.  Patient to speak with [facility] today regarding admission.  Samuella Bruin, MSW, [employer] Clinical Social Worker [facility] [phone]

## 2014-05-12 NOTE — Progress Notes (Signed)
Patient ID: Ralph Jackson, male   DOB: 1946/06/23, 68 y.o.   MRN: [id number] D: Assumed patient's care @ 2330. Patient in bed sleeping. Respiration regular and unlabored. No sign of distress noted at this time A: 15 mins checks for safety. R: Patient is safe.

## 2014-05-12 NOTE — Progress Notes (Signed)
Patient ID: Ralph Jackson, male   DOB: 1945/11/07, 68 y.o.   MRN: [id number] PER STATE REGULATIONS 482.30  THIS CHART WAS REVIEWED FOR MEDICAL NECESSITY WITH RESPECT TO THE PATIENT'S ADMISSION/ DURATION OF STAY.  NEXT REVIEW DATE: 05/17/2014  Willa Rough, RN, BSN CASE MANAGER

## 2014-05-12 NOTE — Progress Notes (Signed)
[facility] MD Progress Note  05/12/2014 1:22 PM JAKORI BURKETT  MRN:  [id number] Subjective: " Patient states that he is here for alcohol related problems and reports he has no new complaints today.  Objective:Patient seen and chart reviewed. Patient found in bed ,appears to be less anxious and denies any SI/HI/AH/VH. Reports sleep as average and appetite as OK. Denies any side effects of medications. Patient is willing to pursue rehab.  Per staff patient has less anxiety ,compliant on medications ,he is improving.  Diagnosis:   DSM5: Primary psychiatric diagnosis: Alcohol use disorder,Severe       Total Time spent with patient: 30 minutes   ADL's:  Intact  Sleep: Fair  Appetite:  Fair Psychiatric Specialty Exam: Physical Exam  Review of Systems  Constitutional: Positive for malaise/fatigue.  HENT: Negative.   Eyes: Negative.   Respiratory: Negative.   Cardiovascular: Negative.   Gastrointestinal: Negative.   Genitourinary: Negative.   Musculoskeletal: Negative.   Skin: Negative.   Neurological: Positive for weakness.  Endo/Heme/Allergies: Negative.   Psychiatric/Behavioral: Positive for substance abuse.    Blood pressure 96/61, pulse 62, temperature 97.7 F (36.5 C), temperature source Oral, resp. rate 17, height  (1.499 m), weight 41.731 kg (92 lb), SpO2 84.00%.Body mass index is 18.57 kg/(m^2).  General Appearance: Fairly Groomed  Patent attorney::  Fair  Speech:  Clear and Coherent and Slow  Volume:  Decreased  Mood:  worried  Affect:  Restricted and worried  Thought Process:  Coherent and Goal Directed  Orientation:  Full (Time, Place, and Person)  Thought Content:  worries concerns   Suicidal Thoughts:  No  Homicidal Thoughts:  No  Memory:  Immediate;   Fair Recent;   Fair Remote;   Fair  Judgement:  Fair  Insight:  Present  Psychomotor Activity:  Decreased  Concentration:  Fair  Recall:  [employer] of Knowledge:NA  Language: Fair  Akathisia:   No  Handed:    AIMS (if indicated):     Assets:  Desire for Improvement Housing  Sleep:  Number of Hours: 6.25   Musculoskeletal: Strength & Muscle Tone: decreased Gait & Station: unsteady Patient leans: N/A  Current Medications: Current Facility-Administered Medications  Medication Dose Route Frequency Provider Last Rate Last Dose  . acetaminophen (TYLENOL) tablet 650 mg  650 mg Oral Q6H PRN Nehemiah Settle, MD      . alum & mag hydroxide-simeth (MAALOX/MYLANTA) 200-200-20 MG/5ML suspension 30 mL  30 mL Oral Q4H PRN Nehemiah Settle, MD      . lisinopril (PRINIVIL,ZESTRIL) tablet 10 mg  10 mg Oral Daily Sanjuana Kava, NP   10 mg at 05/11/14 0934  . lisinopril (PRINIVIL,ZESTRIL) tablet 20 mg  20 mg Oral Once Sanjuana Kava, NP      . magnesium hydroxide (MILK OF MAGNESIA) suspension 30 mL  30 mL Oral Daily PRN Nehemiah Settle, MD      . multivitamin with minerals tablet 1 tablet  1 tablet Oral Daily Nehemiah Settle, MD   1 tablet at 05/12/14 0819  . thiamine (VITAMIN B-1) tablet 100 mg  100 mg Oral Daily Nehemiah Settle, MD   100 mg at 05/12/14 0820    Lab Results: No results found for this or any previous visit (from the past 48 hour(s)).  Physical Findings: AIMS: Facial and Oral Movements Muscles of Facial Expression: None, normal Lips and Perioral Area: None, normal Jaw: None, normal Tongue: None, normal,Extremity Movements Upper (arms, wrists,  hands, fingers): None, normal Lower (legs, knees, ankles, toes): None, normal, Trunk Movements Neck, shoulders, hips: None, normal, Overall Severity Severity of abnormal movements (highest score from questions above): None, normal Incapacitation due to abnormal movements: None, normal Patient's awareness of abnormal movements (rate only patient's report): No Awareness, Dental Status Current problems with teeth and/or dentures?: No Does patient usually wear dentures?: No  CIWA:  CIWA-Ar  Total: 0 COWS:  COWS Total Score: 4  Treatment Plan Summary: Daily contact with patient to assess and evaluate symptoms and progress in treatment Medication management  Plan: Supportive approach/coping skills/relapse prevention           Facilitate admission to a residential treatment facility             Medical Decision Making Problem Points:  Review of psycho-social stressors (1) Data Points:  Review of new medications or change in dosage (2)  I certify that inpatient services furnished can reasonably be expected to improve the patient's condition.   Shontia Gillooly 05/12/2014, 1:22 PM

## 2014-05-12 NOTE — Plan of Care (Signed)
Problem: Ineffective individual coping Goal: STG: Patient will remain free from self harm Outcome: Progressing Pt is safe on unit and 15 min checks for safety

## 2014-05-12 NOTE — Progress Notes (Signed)
D: Pt was in bed resting for the majority of the shift. Pt did not attend group. Pt denied any current withdrawal symptoms. Pt was negative for any SI/HI/AVH. Pt denied any physical pain. Pt's presented with asymptomatic hypotension. Pt was provided with a cup of Gatorade. Writer encouraged fluid consumption with pt. Pt denied any concerns he wished for this writer to address at this time.  A:  Continued support and availability as needed was extended to this pt. Toileting was offered. Pt reports that he recently used the bathroom. Pt was encouraged to seek staff or use the call bell for assistance with ambulation. Staff continue to monitor pt with q80min checks.  R: Pt receptive to treatment. Pt remains safe at this time.

## 2014-05-12 NOTE — Clinical Social Work Note (Signed)
CSW spoke with [facility] staff who request that patient and CSW call back tomorrow to set up interview.  Samuella Bruin, MSW, [employer] Clinical Social Worker [facility] [phone]

## 2014-05-12 NOTE — [facility] Group Notes (Signed)
[facility] Group Notes:  (Nursing/MHT/Case Management/Adjunct)  Date:  05/12/2014  Time:  10:05 AM  Type of Therapy:  Nurse Education  Participation Level:  Did Not Attend  Participation Quality:  Did not attend  Affect:  Did not attend  Cognitive:  Did not attend  Insight:  None  Engagement in Group:  None  Modes of Intervention:  Did not attend  Summary of Progress/Problems: Writer spoke to the theme them of the day, which is leisure and lifestyle activity. Focusing primarily on medication compliance, nutrition, and better ways for patients to take better care of themselves.  Ralph Jackson E 05/12/2014, 10:05 AM

## 2014-05-13 MED ORDER — LISINOPRIL 10 MG PO TABS: 10.0000 mg | ORAL_TABLET | Freq: Every day | ORAL | Status: DC

## 2014-05-13 NOTE — Discharge Summary (Signed)
Patient was seen face to face for psychiatric evaluation, suicide risk assessment and case discussed with treatment team and NP and made appropriate disposition plans. Reviewed the information documented and agree with the treatment plan.   Yehudit Fulginiti ,MD Attending Psychiatrist  [facility]    

## 2014-05-13 NOTE — Discharge Summary (Signed)
Physician Discharge Summary Note  Patient:  Ralph Jackson is an 68 y.o., male MRN:  [id number] DOB:  [birth year] Patient phone:  [phone] (home)  Patient address:   [address] [name] [address],  Total Time spent with patient: Greater than 30 minutes  Date of Admission:  05/05/2014 Date of Discharge: 05/13/14  Reason for Admission: Alcohol intoxication requiring detox treatments  Discharge Diagnoses: Active Problems:   * No active hospital problems. *   Psychiatric Specialty Exam: Physical Exam  Psychiatric: His speech is normal and behavior is normal. Judgment and thought content normal. His mood appears not anxious. His affect is not angry, not blunt, not labile and not inappropriate. Cognition and memory are normal. He does not exhibit a depressed mood.    Review of Systems  Constitutional: Negative.   HENT: Negative.   Eyes: Negative.   Respiratory: Negative.   Cardiovascular: Negative.   Gastrointestinal: Negative.   Genitourinary: Negative.   Musculoskeletal: Negative.   Skin: Negative.   Neurological: Negative.   Endo/Heme/Allergies: Negative.   Psychiatric/Behavioral: Positive for substance abuse (Alcoholism, chronic). Negative for depression, suicidal ideas, hallucinations and memory loss. The patient is not nervous/anxious and does not have insomnia.     Blood pressure 116/81, pulse 73, temperature 98 F (36.7 C), temperature source Oral, resp. rate 16, height  (1.499 m), weight 41.731 kg (92 lb), SpO2 84.00%.Body mass index is 18.57 kg/(m^2).   General Appearance: Casual   Eye Contact:: Fair   Speech: Clear and Coherent   Volume: Normal   Mood: Euthymic   Affect: Congruent   Thought Process: Coherent   Orientation: Full (Time, Place, and Person)   Thought Content: denies AH/VH/SI/HI   Suicidal Thoughts: No   Homicidal Thoughts: No   Memory: Immediate; Good  Recent; Good  Remote; Good   Judgement: Fair   Insight: Fair    Psychomotor Activity: Normal   Concentration: Fair   Recall: [employer] of Knowledge:Fair   Language: Fair   Akathisia: No     AIMS (if indicated): 0   Assets: Communication Skills   Sleep: Number of Hours: 6.25    Past Psychiatric History:  Diagnosis: Alcohol dependence   Hospitalizations: [facility] adult unit X 2  Outpatient Care: [facility]  Substance Abuse Care: NA  Self-Mutilation: Denies   Suicidal Attempts: Denies   Violent Behaviors: Denies    Musculoskeletal: Strength & Muscle Tone: within normal limits Gait & Station: normal Patient leans: N/A  DSM5: Schizophrenia Disorders:  NA Obsessive-Compulsive Disorders:  NA Trauma-Stressor Disorders:  NA Substance/Addictive Disorders:  Alcohol Related Disorder - Severe (303.90) Depressive Disorders:  NA  Axis Diagnosis:  AXIS I:  Alcohol dependence,  AXIS II:  Deferred AXIS III:   Past Medical History  Diagnosis Date  . Hypertension   . Urinary tract infection   . ETOH abuse    AXIS IV:  other psychosocial or environmental problems and Alcoholism, chronic AXIS V:  63  Level of Care:  OP  Hospital Course:  Mr. [name] good was discharged from this hospital on 04/05/14 after alcohol detox. He reports, "I had a relapse on alcohol again. It happened about 2 weeks ago. I don't know the reason I relapsed. But, I have been drinking a lot of wine since then, about a fifth a day.I don't know why I drink so much alcohol. I'm not depressed and or anxious. I would want to to go to a long term substance abuse treatment when I get  discharged from this hospital this time".  [name] was admitted to the hospital after recently being discharged from this hospital. This time with a blood alcohol level of 203 per toxicology tests reports and his UDS test reports was positive for Benzodiazepine. He was weak, frail and intoxicated just as he was during his last July admission in this hospital. He was again in need of alcohol detoxification  treatments to re-stabilize his systems of alcohol intoxication. Bryne received Librium detoxification treatment protocols. Within few days of his hospital stay, [name] was weak, sluggish and incontinent of bowel and bladder and presented a high fall risks. He was using the bathroom on the floor of the room he was sharing with another patient. He was then transferred to another unit where he remained in a room without a roommate.   Besides the detox treatments, [name] was also medicated and discharged on only Lisinopril 10 mg daily for high blood pressure. Throughout his hospital stay and up to his discharge date , he maintained that he was never depressed and or anxious. He tolerated his treatment regimen without any significant adverse effects. [name] has completed detoxification treatments and his mood is stable. This is evidenced by his reports of improved mood and absence of substance withdrawal symptoms. He is currently being discharged to his residence and will be following up care at the [place] [facility] here in [place], [place]. He is provided with all the necessary information required to make contact and make this appointment without problems.   Upon discharge, he adamantly denies any SIHI, AVH, delusional thoughts, paranoia and or withdrawal symptoms. He received from the [facility] pharmacy, a 4 days worth, supply samples of his [facility] discharge medications. He left [facility] with all personal belongings in no apparent distress. Transportation per city bus. [facility] provided bus ticket   Consults:  psychiatry  Significant Diagnostic Studies:  labs: CBC with diff, CMP, UDS, toxicology tests, U/A  Discharge Vitals:   Blood pressure 116/81, pulse 73, temperature 98 F (36.7 C), temperature source Oral, resp. rate 16, height  (1.499 m), weight 41.731 kg (92 lb), SpO2 84.00%. Body mass index is 18.57 kg/(m^2). Lab Results:   No results found for this or any previous visit (from the past 72  hour(s)).  Physical Findings: AIMS: Facial and Oral Movements Muscles of Facial Expression: None, normal Lips and Perioral Area: None, normal Jaw: None, normal Tongue: None, normal,Extremity Movements Upper (arms, wrists, hands, fingers): None, normal Lower (legs, knees, ankles, toes): None, normal, Trunk Movements Neck, shoulders, hips: None, normal, Overall Severity Severity of abnormal movements (highest score from questions above): None, normal Incapacitation due to abnormal movements: None, normal Patient's awareness of abnormal movements (rate only patient's report): No Awareness, Dental Status Current problems with teeth and/or dentures?: No Does patient usually wear dentures?: No  CIWA:  CIWA-Ar Total: 3 COWS:  COWS Total Score: 4  Psychiatric Specialty Exam: See Psychiatric Specialty Exam and Suicide Risk Assessment completed by Attending Physician prior to discharge.  Discharge destination:  Home  Is patient on multiple antipsychotic therapies at discharge:  No   Has Patient had three or more failed trials of antipsychotic monotherapy by history:  No  Recommended Plan for Multiple Antipsychotic Therapies: NA    Medication List       Indication   lisinopril 10 MG tablet  Commonly known as:  PRINIVIL,ZESTRIL  Take 1 tablet (10 mg total) by mouth daily. For high blood pressure   Indication:  High Blood Pressure  Follow-up Information   Follow up with [facility]. (Walk in between 8am-9am Monday through Friday for hospital followup/medication management. )    Contact information:   [address] [place] [address] Phone: [phone] Fax: [phone]     Follow-up recommendations: Activity:  As tolerated Diet: As recommended by your primary care doctor. Keep all scheduled follow-up appointments as recommended.   Comments:  Take all your medications as prescribed by your mental healthcare provider. Report any adverse effects and or reactions from  your medicines to your outpatient provider promptly. Patient is instructed and cautioned to not engage in alcohol and or illegal drug use while on prescription medicines. In the event of worsening symptoms, patient is instructed to call the crisis hotline, 911 and or go to the nearest ED for appropriate evaluation and treatment of symptoms. Follow-up with your primary care provider for your other medical issues, concerns and or health care needs.  Total Discharge Time:  Greater than 30 minutes.  SignedSanjuana Kava, PMHNP-BC 05/13/2014, 10:51 AM

## 2014-05-13 NOTE — Progress Notes (Signed)
Patient discharged per physician order; patient denies SI/HI and A/V hallucinations; patient received samples, prescriptions, bus pass, and copy of AVS after it was reviewed; patient had no other questions or concerns at this time; patient verbalized and signed that she received all belongings; patient left the unit ambulatory

## 2014-05-13 NOTE — Progress Notes (Signed)
  D:Pt observed sleeping in bed with eyes closed. RR even and unlabored. No distress noted. A: 15 min observation continues for safety  R: pt remains safe  

## 2014-05-13 NOTE — Tx Team (Signed)
  Interdisciplinary Treatment Plan Update   Date Reviewed:  05/13/2014  Time Reviewed:  9:28 AM  Progress in Treatment:   Attending groups: Intermittently  Participating in groups: Minimally  Taking medication as prescribed: Yes  Tolerating medication: Yes Family/Significant other contact made: Pt refused. SPE completed with pt.   Patient understands diagnosis: Yes  Discussing patient identified problems/goals with staff: Yes  See initial care plan Medical problems stabilized or resolved: Yes Denies suicidal/homicidal ideation: Yes  In tx team Patient has not harmed self or others: Yes  For review of initial/current patient goals, please see plan of care.  Estimated Length of Stay:  d/c today  Reason for Continuation of Hospitalization: none  New Problems/Goals identified:  N/A  Discharge Plan or Barriers:  Pt decided against going to [facility]. Stated that he wants to return to home (rents room) and follow-up at [facility] for med management.  CSW contacted [facility] and cancelled pt bed for 10/19. Pt asked for bus pass and is stable for d/c today per MD.  Additional Comments: none    Attendees:  Signature: Ivin Booty, MD 05/13/2014 9:28 AM   Signature: Richelle Ito, LCSW 05/13/2014 9:28 AM  Signature: Trula Slade, LCSWA  05/13/2014 9:28 AM  Signature: Dorothyann Peng LCSW  05/13/2014 9:28 AM  Signature: Charleston Ropes, CSW Intern  05/13/2014 9:28 AM  Signature: Lowanda Foster RN 05/13/2014 9:28 AM  Signature:  Mardella Layman RN 05/13/2014 9:28 AM  Signature:    Signature:    Signature:    Signature:    Signature:    Signature:      Scribe for Treatment Team:   Trula Slade, LCSWA  05/13/2014 9:28 AM

## 2014-05-13 NOTE — [facility] Suicide Risk Assessment (Signed)
   Demographic Factors:  Male, Caucasian and Low socioeconomic status  Total Time spent with patient: 20 minutes  Psychiatric Specialty Exam: Physical Exam  Constitutional: He is oriented to person, place, and time. He appears well-developed.  HENT:  Head: Normocephalic and atraumatic.  Neck: Normal range of motion. Neck supple.  Cardiovascular: Normal rate.   Respiratory: Effort normal.  GI: Soft.  Musculoskeletal: Normal range of motion.  Neurological: He is alert and oriented to person, place, and time.  Skin: Skin is warm.  Psychiatric: He has a normal mood and affect. His speech is normal and behavior is normal. Judgment and thought content normal. Cognition and memory are normal.    Review of Systems  Constitutional: Negative.   HENT: Negative.   Eyes: Negative.   Respiratory: Negative.   Cardiovascular: Negative.   Gastrointestinal: Negative.   Genitourinary: Negative.   Musculoskeletal: Negative.   Skin: Negative.   Psychiatric/Behavioral: Positive for substance abuse. Negative for depression, suicidal ideas and hallucinations. The patient is not nervous/anxious.     Blood pressure 116/81, pulse 73, temperature 98 F (36.7 C), temperature source Oral, resp. rate 16, height  (1.499 m), weight 41.731 kg (92 lb), SpO2 84.00%.Body mass index is 18.57 kg/(m^2).  General Appearance: Casual  Eye Contact::  Fair  Speech:  Clear and Coherent  Volume:  Normal  Mood:  Euthymic  Affect:  Congruent  Thought Process:  Coherent  Orientation:  Full (Time, Place, and Person)  Thought Content:  denies AH/VH/SI/HI  Suicidal Thoughts:  No  Homicidal Thoughts:  No  Memory:  Immediate;   Good Recent;   Good Remote;   Good  Judgement:  Fair  Insight:  Fair  Psychomotor Activity:  Normal  Concentration:  Fair  Recall:  [employer] of Knowledge:Fair  Language: Fair  Akathisia:  No    AIMS (if indicated):   0  Assets:  Communication Skills  Sleep:  Number of Hours: 6.25     Musculoskeletal: Strength & Muscle Tone: within normal limits Gait & Station: normal Patient leans: N/A   Mental Status Per Nursing Assessment::   On Admission:  NA  Current Mental Status by Physician: Patient appears to be cheerful ,calm ,is aware of his alcohol problem ,but denying rehab since he feels he does not want to have the financial situation for that now.Patient denies SII/HI/AH/VH  Loss Factors: Financial problems/change in socioeconomic status  Historical Factors: Impulsivity  Risk Reduction Factors:   Positive therapeutic relationship  Continued Clinical Symptoms:  Alcohol/Substance Abuse/Dependencies  Cognitive Features That Contribute To Risk:  Closed-mindedness    Suicide Risk:  Minimal: No identifiable suicidal ideation.  Patients presenting with no risk factors but with morbid ruminations; may be classified as minimal risk based on the severity of the depressive symptoms  Discharge Diagnoses:   DSM5:  Primary psychiatric diagnosis:  Alcohol use disorder,Severe   Non psychiatric diagnosis: Hypertension   Past Medical History  Diagnosis Date  . Hypertension   . Urinary tract infection   . ETOH abuse     Plan Of Care/Follow-up recommendations:  Activity:  No restrictions  Is patient on multiple antipsychotic therapies at discharge:  No   Has Patient had three or more failed trials of antipsychotic monotherapy by history:  No  Recommended Plan for Multiple Antipsychotic Therapies: NA    Shiela Bruns 05/13/2014, 10:13 AM

## 2014-05-13 NOTE — Progress Notes (Signed)
[facility] Adult Case Management Discharge Plan :  Will you be returning to the same living situation after discharge: Yes,  home (rents a room) At discharge, do you have transportation home?:Yes,  bus pass in chart Do you have the ability to pay for your medications:Yes,  medicare  Release of information consent forms completed and submitted to medical records by CSW  Patient to Follow up at: Follow-up Information   Follow up with [facility]. (Walk in between 8am-9am Monday through Friday for hospital followup/medication management. )    Contact information:   [address], [place] [address] Phone: [phone] Fax: [phone]      Patient denies SI/HI:   Yes,  during group/self report.    Safety Planning and Suicide Prevention discussed:  Yes,  Pt refused to consent to family contact. SPE completed with pt and he was provided with SPI pamphlet and encouraged to share information with support network, ask questions, and talk about any concerns relating to SPE.  Smart, Johnedward Brodrick  LCSWA  05/13/2014, 9:26 AM

## 2014-05-14 NOTE — Discharge Summary (Signed)
Physician Discharge Summary Note  Patient:  Ralph Jackson is an 68 y.o., male MRN:  [id number] DOB:  15-Sep-1945 Patient phone:  [phone] (home)  Patient address:   [address] [name] [address],  Total Time spent with patient: 30 minutes  Date of Admission:  04/21/2014 Date of Discharge: 8/23  Reason for Admission:  Alcohol intoxication and dependence  Discharge Diagnoses: Active Problems:   * No active hospital problems. *   Psychiatric Specialty Exam: Physical Exam  ROS  Blood pressure 121/71, pulse 60, temperature 97.6 F (36.4 C), temperature source Oral, resp. rate 18, height  (1.626 m), weight 43.545 kg (96 lb).Body mass index is 16.47 kg/(m^2).  General Appearance: Casual  Eye Contact::  Good  Speech:  Clear and Coherent and Normal Rate  Volume:  Normal  Mood:  Euthymic  Affect:  Appropriate  Thought Process:  Coherent, Goal Directed and Intact  Orientation:  Full (Time, Place, and Person)  Thought Content:  WDL  Suicidal Thoughts:  No  Homicidal Thoughts:  No  Memory:  Immediate;   Good Recent;   Good Remote;   Good  Judgement:  Fair  Insight:  Good  Psychomotor Activity:  Tremor and MILD TREMORS  Concentration:  Good  Recall:  NA  Fund of Knowledge:Good  Language: Good  Akathisia:  NA  Handed:  Right  AIMS (if indicated):     Assets:  Desire for Improvement  Sleep:       Past Psychiatric History: Diagnosis:  Hospitalizations:  Outpatient Care:  Substance Abuse Care:  Self-Mutilation:  Suicidal Attempts:  Violent Behaviors:   Musculoskeletal: Strength & Muscle Tone: within normal limits Gait & Station: normal Patient leans: N/A  DSM5:  Schizophrenia Disorders:   Obsessive-Compulsive Disorders:   Trauma-Stressor Disorders:   Substance/Addictive Disorders:  Alcohol Related Disorder - Severe (303.90) Depressive Disorders:   Axis Diagnosis:   AXIS I:  Alcohol dependence, intoxication AXIS II:  Deferred AXIS III:    Past Medical History  Diagnosis Date  . Hypertension   . Urinary tract infection   . ETOH abuse    AXIS IV:  other psychosocial or environmental problems and problems related to social environment AXIS V:  61-70 mild symptoms  Level of Care:  Observation sent home per his wish  Hospital Course:  Patient is a 69 year old caucasian male who was admitted in our observation unit for alcohol intoxication and withdrawal. He was placed on our alcohol detox protocol using Librium. Patient has a hx of drinking alcohol for 40 years, has been to several detox treatment facilities. He was last detoxed here in July and discharged early August. Patient is alert, oriented and ready to go home. Patient does not want inpatient admission for detox at this time. Patient denied any mental illness diagnosis. Patient denied SI/HI/AVH and he will be discharged home.  Dr Tawni Carnes concurred to the discharge plan.  Patient promised to seek treatment for his drinking this time.   Consults:  None  Significant Diagnostic Studies:  labs: CBC, CMP, ALCOHOL LEVEL, URINE UDS in the ER   Discharge Vitals:   Blood pressure 121/71, pulse 60, temperature 97.6 F (36.4 C), temperature source Oral, resp. rate 18, height  (1.626 m), weight 43.545 kg (96 lb). Body mass index is 16.47 kg/(m^2). Lab Results:   No results found for this or any previous visit (from the past 72 hour(s)).  Physical Findings: AIMS: Facial and Oral Movements Muscles of Facial Expression: None, normal  Lips and Perioral Area: None, normal Jaw: None, normal Tongue: None, normal,Extremity Movements Upper (arms, wrists, hands, fingers): None, normal Lower (legs, knees, ankles, toes): None, normal, Trunk Movements Neck, shoulders, hips: None, normal, Overall Severity Severity of abnormal movements (highest score from questions above): None, normal Incapacitation due to abnormal movements: None, normal Patient's awareness of abnormal movements  (rate only patient's report): No Awareness, Dental Status Current problems with teeth and/or dentures?: No Does patient usually wear dentures?: No  CIWA:  CIWA-Ar Total: 0 COWS:  COWS Total Score: 2  Psychiatric Specialty Exam: See Psychiatric Specialty Exam and Suicide Risk Assessment completed by Attending Physician prior to discharge.  Discharge destination:  Other:  HOME  Is patient on multiple antipsychotic therapies at discharge:  No   Has Patient had three or more failed trials of antipsychotic monotherapy by history:  No  Recommended Plan for Multiple Antipsychotic Therapies: NA  Discharge Instructions   Diet - low sodium heart healthy    Complete by:  As directed      Discharge instructions    Complete by:  As directed   Stop drinking alcohol, follow up with out patient rehabilitation facility     Increase activity slowly    Complete by:  As directed             Medication List    Notice   You have not been prescribed any medications.       Follow-up recommendations:  Activity:  AS TOLERATED Diet:  REGULAR Comments:  Patient is discharged home to follow up with his out patient provider.  Total Discharge Time:  Greater than 30 minutes.  SignedEarney Navy   PMHNP-BC 05/14/2014, 12:29 PM

## 2014-05-17 NOTE — Progress Notes (Signed)
Patient Discharge Instructions:  After Visit Summary (AVS):   Faxed to:  05/17/14 Discharge Summary Note:   Faxed to:  05/17/14 Psychiatric Admission Assessment Note:   Faxed to:  05/17/14 Suicide Risk Assessment - Discharge Assessment:   Faxed to:  05/17/14 Faxed/Sent to the Next Level Care provider:  05/17/14 Faxed to [facility] @ [phone]  Jerelene Redden, 05/17/2014, 3:56 PM

## 2014-05-18 NOTE — Discharge Summary (Signed)
Case discussed with NP. Pt interviewed for discharge SRA. Chart reviewed. Agree with above discharge summary.  Marquitta Persichetti, MD 

## 2014-05-25 NOTE — Progress Notes (Signed)
Patient ID: Ralph Jackson, male   DOB: 1945/11/28, 68 y.o.   MRN: [id number] [facility] MD Progress Note  03/29/2014 1:35 PM [name]  MRN:  [id number] Subjective:  Pt seen and chart reviewed. Pt presents as depressed and intermittently flat without objective presentation of anxiety. Pt minimizes anxiety and rates depression at 5/10. Pt continues to report poor sleep and low appetite and has also been wearing an adult diaper on the unit. Continues to have thoughts of wanting to drink alcohol but denies physical symptoms at this time. Pt has also been refusing to complete the self inventory at times. Denies SI, HI, and AVH, but is very flat and may not be reporting his true emotional state.   Denies SI/HI/AH. He reports seeing spots x 2-3 days.  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 Hypertension   . Urinary tract infection   . ETOH abuse    Axis IV: economic problems, educational problems, housing problems, occupational problems, other psychosocial or environmental problems, problems with access to health care services and problems with primary support group Axis V: 41-50 serious symptoms  ADL's:  Impaired  Sleep: Fair  Appetite:  Good  Suicidal Ideation: denies Homicidal Ideation: Denies AEB (as evidenced by):  Psychiatric Specialty Exam: Physical Exam  Constitutional: He is oriented to person, place, and time. He appears well-developed.  Small and mal nourished appearing, patient reports "ive always been a small man"  HENT:  Head: Normocephalic and atraumatic.  Neck: Normal range of motion. Neck supple.  Musculoskeletal: Normal range of motion.  Neurological: He is alert  and oriented to person, place, and time.  Skin: Skin is warm and dry.    Review of Systems  Constitutional: Negative.   HENT: Negative.   Eyes: Negative.   Respiratory: Negative.   Cardiovascular: Negative.   Gastrointestinal: Negative.   Genitourinary: Positive for frequency.  Musculoskeletal: Negative.   Skin: Negative.   Neurological: Positive for tremors.  Endo/Heme/Allergies: Negative.   Psychiatric/Behavioral: Positive for depression and substance abuse. Negative for suicidal ideas. The patient is not nervous/anxious.     Blood pressure 127/82, pulse 74, temperature 97.7 F (36.5 C), temperature source Oral, resp. rate 16, height  (1.626 m), weight 43.092 kg (95 lb), SpO2 100.00%.Body mass index is 16.3 kg/(m^2).  General Appearance: Disheveled  Eye Solicitor::  Fair  Speech:  Garbled  Volume:  Decreased  Mood:  Depressed  Affect:  Flat  Thought Process:  Coherent  Orientation:  Full (Time, Place, and Person)  Thought Content:  Hallucinations: Visual "seeing spots" during his stay here at [facility]  Suicidal Thoughts:  No  Homicidal Thoughts:  No  Memory:  Immediate;   Fair Recent;   Fair Remote;   Fair  Judgement:  Impaired  Insight:  Lacking  Psychomotor Activity:  Decreased and Tremor (tremor is improving)  Concentration:  Poor  Recall:  Poor  Fund of Knowledge:Poor  Language: Fair  Akathisia:  Negative  Handed:  Right  AIMS (if indicated):     Assets:  Desire for Improvement Resilience  Sleep:  Number of Hours: 6.5   Musculoskeletal: Strength & Muscle Tone: decreased Gait & Station: unsteady Patient leans: N/A  Current Medications:  Current Facility-Administered  Medications   Medication  Dose  Route  Frequency  Provider  Last Rate  Last Dose   .  acetaminophen (TYLENOL) tablet 650 mg  650 mg  Oral  Q6H PRN  Audrea Muscat, NP   650 mg at 03/27/14 1538   .  alum & mag hydroxide-simeth (MAALOX/MYLANTA) 200-200-20 MG/5ML suspension 30 mL  30 mL  Oral   Q4H PRN  Evanna Janann August, NP     .  labetalol (NORMODYNE) tablet 100 mg  100 mg  Oral  BID  Audrea Muscat, NP   100 mg at 03/28/14 0858   .  magnesium hydroxide (MILK OF MAGNESIA) suspension 30 mL  30 mL  Oral  Daily PRN  Evanna Janann August, NP     .  multivitamin with minerals tablet 1 tablet  1 tablet  Oral  Daily  Evanna Janann August, NP   1 tablet at 03/28/14 0858   .  thiamine (VITAMIN B-1) tablet 100 mg  100 mg  Oral  Daily  Evanna Cori Merry Proud, NP   100 mg at 03/28/14 0858   .  traZODone (DESYREL) tablet 50 mg  50 mg  Oral  QHS PRN,MR X 1  Evanna Cori Merry Proud, NP   50 mg at 03/24/14 2216     Lab Results: No results found for this or any previous visit (from the past 48 hour(s)).  Physical Findings: AIMS: Facial and Oral Movements Muscles of Facial Expression: None, normal Lips and Perioral Area: None, normal Jaw: None, normal Tongue: None, normal,Extremity Movements Upper (arms, wrists, hands, fingers): None, normal Lower (legs, knees, ankles, toes): None, normal, Trunk Movements Neck, shoulders, hips: None, normal, Overall Severity Severity of abnormal movements (highest score from questions above): None, normal Incapacitation due to abnormal movements: None, normal Patient's awareness of abnormal movements (rate only patient's report): No Awareness, Dental Status Current problems with teeth and/or dentures?: No Does patient usually wear dentures?: No  CIWA:  CIWA-Ar Total: 1 COWS:  COWS Total Score: 4  Treatment Plan Summary: Daily contact with patient to assess and evaluate symptoms and progress in treatment Medication management  Review of chart, vital signs, medications, and notes.  1-Individual and group therapy  2-Medication management for depression and anxiety: Medications reviewed with the patient and she stated no untoward effects, unchanged. 3-Coping skills for depression, anxiety  4-Continue crisis stabilization and management  5-Address health  issues--monitoring vital signs, stable  6-Treatment plan in progress to prevent relapse of depression and anxiety     Medical Decision Making Problem Points:  Established problem, stable/improving (1) and Review of psycho-social stressors (1) Data Points:  Review of medication regiment & side effects (2) Review of new medications or change in dosage (2)  I certify that inpatient services furnished can reasonably be expected to improve the patient's condition.   Beau Fanny, FNP-BC 03/29/2014, 1:35 PM I agree with assessment and plan Madie Reno A. Dub Mikes, M.D.

## 2014-07-04 ENCOUNTER — Encounter (HOSPITAL_COMMUNITY): Payer: Self-pay | Admitting: *Deleted

## 2014-07-04 ENCOUNTER — Emergency Department (HOSPITAL_COMMUNITY): Payer: Medicare Other

## 2014-07-04 ENCOUNTER — Emergency Department (HOSPITAL_COMMUNITY)
Admission: EM | Admit: 2014-07-04 | Discharge: 2014-07-05 | Disposition: A | Discharge status: Other Acute Inpatient Hospital | Payer: Medicare Other | Source: Home / Self Care | Attending: Emergency Medicine | Admitting: Emergency Medicine

## 2014-07-04 DIAGNOSIS — Z79899 Other long term (current) drug therapy: Secondary | ICD-10-CM | POA: Insufficient documentation

## 2014-07-04 DIAGNOSIS — I10 Essential (primary) hypertension: Secondary | ICD-10-CM

## 2014-07-04 DIAGNOSIS — F10220 Alcohol dependence with intoxication, uncomplicated: Secondary | ICD-10-CM

## 2014-07-04 DIAGNOSIS — E43 Unspecified severe protein-calorie malnutrition: Secondary | ICD-10-CM | POA: Diagnosis not present

## 2014-07-04 DIAGNOSIS — F419 Anxiety disorder, unspecified: Secondary | ICD-10-CM | POA: Insufficient documentation

## 2014-07-04 DIAGNOSIS — S01419A Laceration without foreign body of unspecified cheek and temporomandibular area, initial encounter: Secondary | ICD-10-CM

## 2014-07-04 DIAGNOSIS — W19XXXA Unspecified fall, initial encounter: Secondary | ICD-10-CM

## 2014-07-04 DIAGNOSIS — F10920 Alcohol use, unspecified with intoxication, uncomplicated: Secondary | ICD-10-CM

## 2014-07-04 DIAGNOSIS — F10229 Alcohol dependence with intoxication, unspecified: Secondary | ICD-10-CM

## 2014-07-04 DIAGNOSIS — Z87440 Personal history of urinary (tract) infections: Secondary | ICD-10-CM | POA: Insufficient documentation

## 2014-07-04 DIAGNOSIS — R627 Adult failure to thrive: Secondary | ICD-10-CM | POA: Diagnosis not present

## 2014-07-04 DIAGNOSIS — S0181XA Laceration without foreign body of other part of head, initial encounter: Secondary | ICD-10-CM

## 2014-07-04 DIAGNOSIS — Y929 Unspecified place or not applicable: Secondary | ICD-10-CM | POA: Insufficient documentation

## 2014-07-04 DIAGNOSIS — W1839XA Other fall on same level, initial encounter: Secondary | ICD-10-CM | POA: Insufficient documentation

## 2014-07-04 DIAGNOSIS — Z681 Body mass index (BMI) 19 or less, adult: Secondary | ICD-10-CM | POA: Diagnosis not present

## 2014-07-04 DIAGNOSIS — Y9389 Activity, other specified: Secondary | ICD-10-CM

## 2014-07-04 DIAGNOSIS — Y908 Blood alcohol level of 240 mg/100 ml or more: Secondary | ICD-10-CM | POA: Diagnosis not present

## 2014-07-04 LAB — COMPREHENSIVE METABOLIC PANEL
ALT: 17 U/L (ref 0–53)
AST: 26 U/L (ref 0–37)
Albumin: 3.1 g/dL — ABNORMAL LOW (ref 3.5–5.2)
Alkaline Phosphatase: 234 U/L — ABNORMAL HIGH (ref 39–117)
Anion gap: 18 — ABNORMAL HIGH (ref 5–15)
BUN: 6 mg/dL (ref 6–23)
CO2: 26 mEq/L (ref 19–32)
CREATININE: 0.71 mg/dL (ref 0.50–1.35)
Calcium: 8.5 mg/dL (ref 8.4–10.5)
Chloride: 98 mEq/L (ref 96–112)
GFR calc Af Amer: >90 mL/min (ref >90)
GFR calc non Af Amer: >90 mL/min (ref >90)
Glucose, Bld: 79 mg/dL (ref 70–99)
POTASSIUM: 3.7 meq/L (ref 3.7–5.3)
SODIUM: 142 meq/L (ref 137–147)
Total Bilirubin: 0.8 mg/dL (ref 0.3–1.2)
Total Protein: 6.9 g/dL (ref 6.0–8.3)

## 2014-07-04 LAB — RAPID URINE DRUG SCREEN, HOSP PERFORMED
AMPHETAMINES: NOT DETECTED
Barbiturates: NOT DETECTED
Benzodiazepines: NOT DETECTED
Cocaine: NOT DETECTED
OPIATES: NOT DETECTED
Tetrahydrocannabinol: NOT DETECTED

## 2014-07-04 LAB — CBC
HCT: 48.4 % (ref 39.0–52.0)
HEMOGLOBIN: 16.7 g/dL (ref 13.0–17.0)
MCH: 33.9 pg (ref 26.0–34.0)
MCHC: 34.5 g/dL (ref 30.0–36.0)
MCV: 98.2 fL (ref 78.0–100.0)
Platelets: 169 10*3/uL (ref 150–400)
RBC: 4.93 MIL/uL (ref 4.22–5.81)
RDW: 16.8 % — AB (ref 11.5–15.5)
WBC: 6.6 10*3/uL (ref 4.0–10.5)

## 2014-07-04 LAB — ACETAMINOPHEN LEVEL

## 2014-07-04 LAB — SALICYLATE LEVEL

## 2014-07-04 LAB — ETHANOL: Alcohol, Ethyl (B): 297 mg/dL — ABNORMAL HIGH (ref 0–11)

## 2014-07-04 IMAGING — CT CT CERVICAL SPINE W/O CM
3 of 5 series · 12 of 33 positions shown, 14 images · non-contrast
Comparison: CT scan of the head and cervical spine [DATE]

CLINICAL DATA: Status post fall; no head or neck pain now;
laceration of the Chan

EXAM:
CT HEAD WITHOUT CONTRAST
CT CERVICAL SPINE WITHOUT CONTRAST
TECHNIQUE: Multidetector CT imaging of the head and cervical spine was
performed following the standard protocol without intravenous
contrast. Multiplanar CT image reconstructions of the cervical spine
were also generated.

[Series 5: coronals · coronal · 0.23mm/px · 3 of 54 slices shown]
[im 11/54  bone]
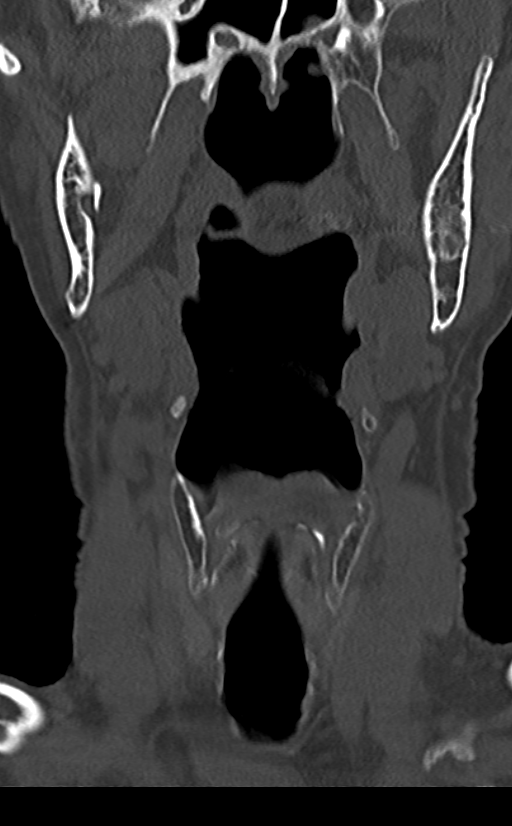
[im 22/54  bone]
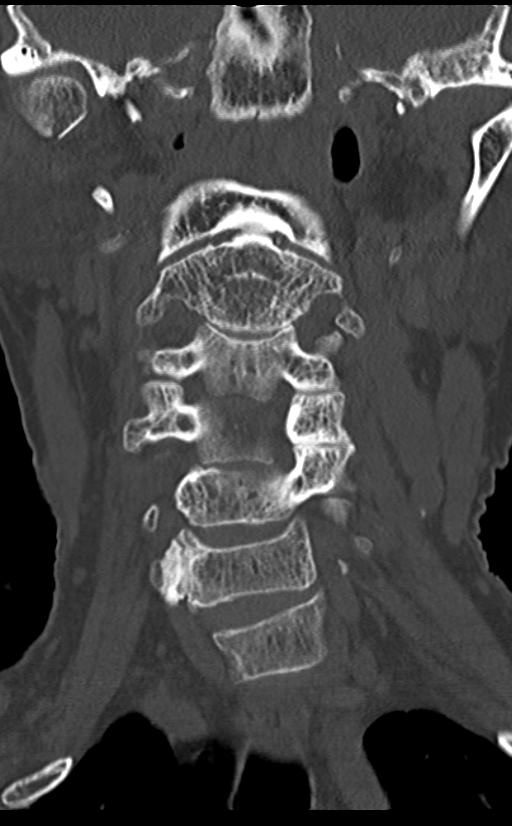
[im 32/54  bone]
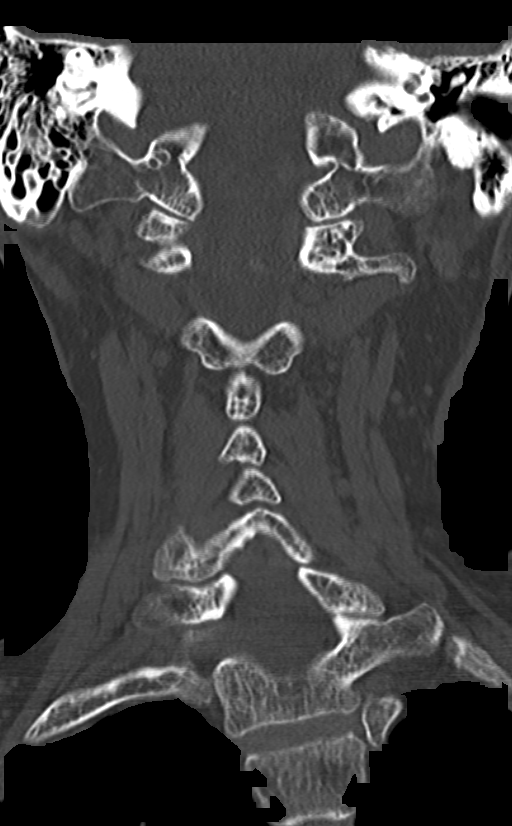

[Series 6: sagittals · sagittal · 0.27mm/px · 5 of 48 slices shown, 6 images]
[im 16/48  bone]
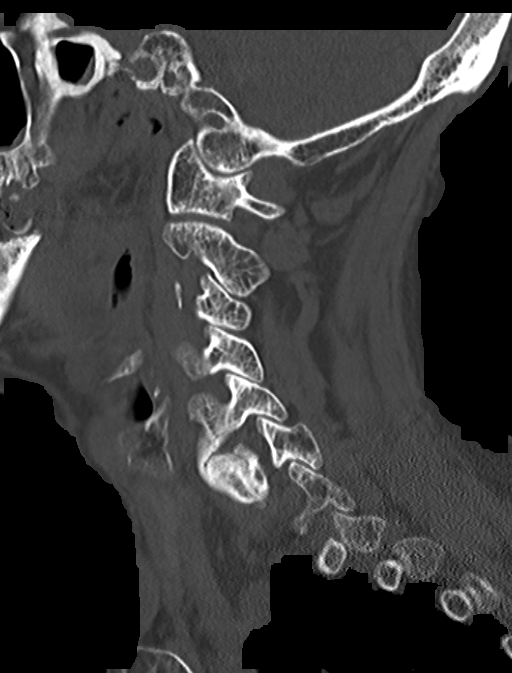
[im 20/48  bone]
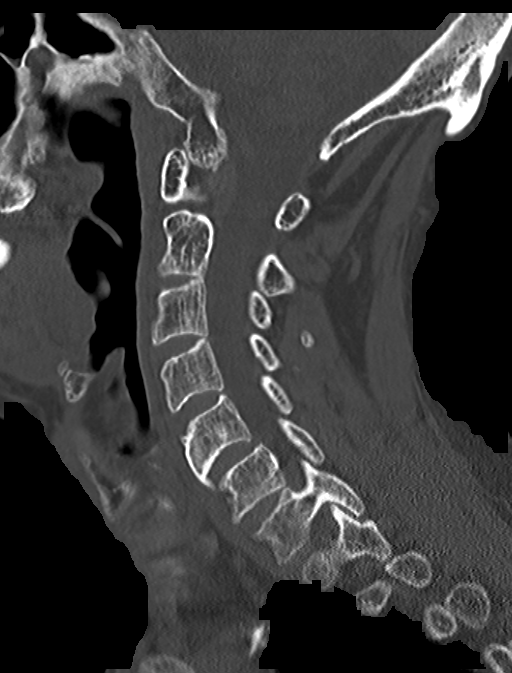
[im 24/48  soft-tissue]
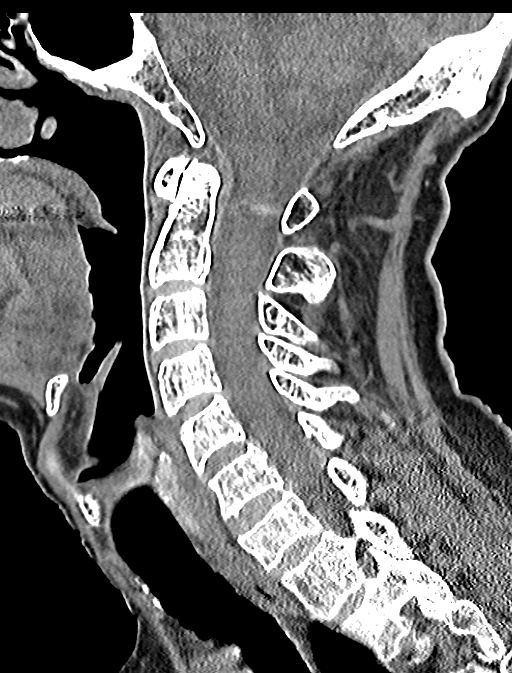
[im 24/48  bone]
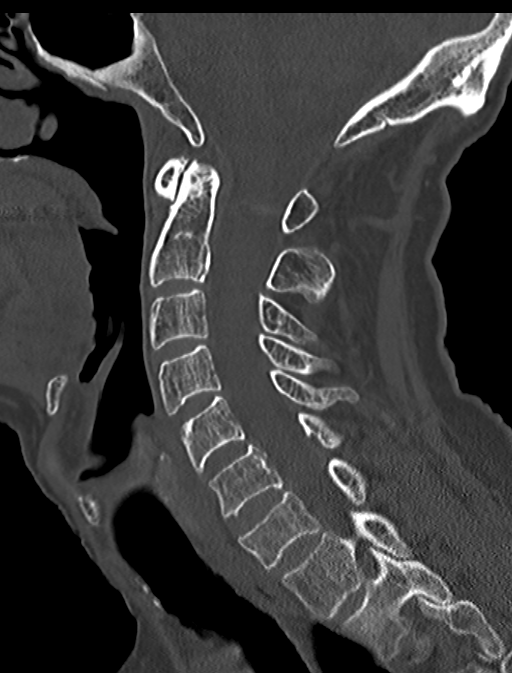
[im 28/48  bone]
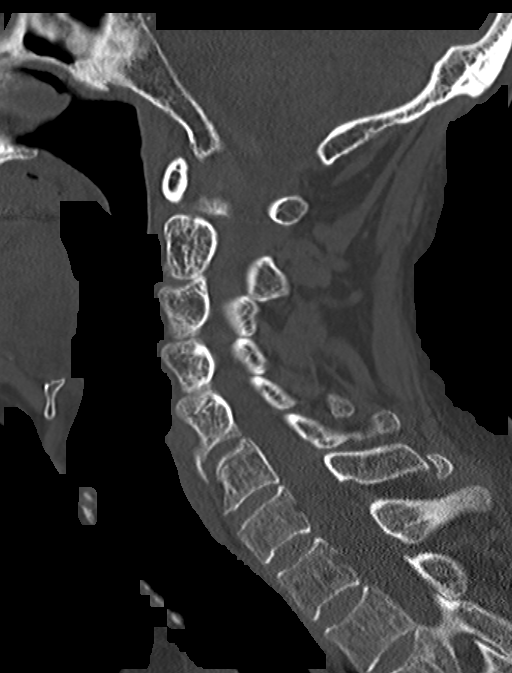
[im 32/48  bone]
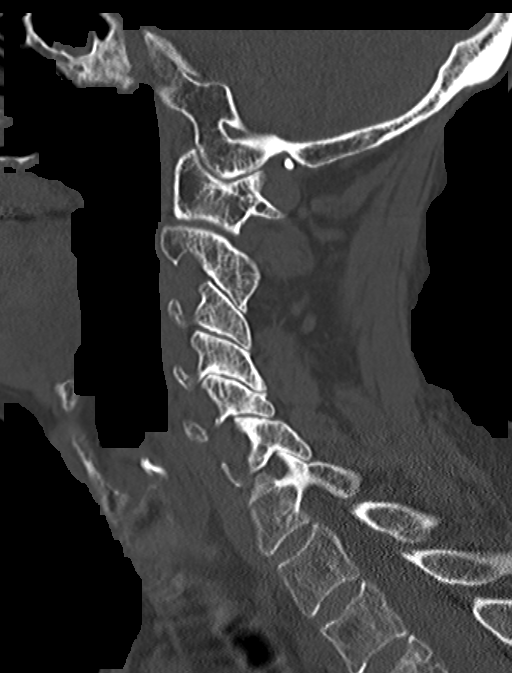

[Series 7: orthogonals · axial · 0.18mm/px · z∈[-229,-121]mm · 4 of 96 slices shown, 5 images]
[im 20/96  soft-tissue]
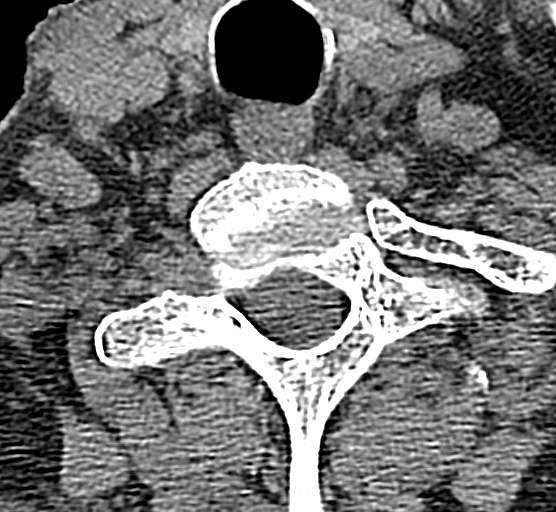
[im 20/96  bone]
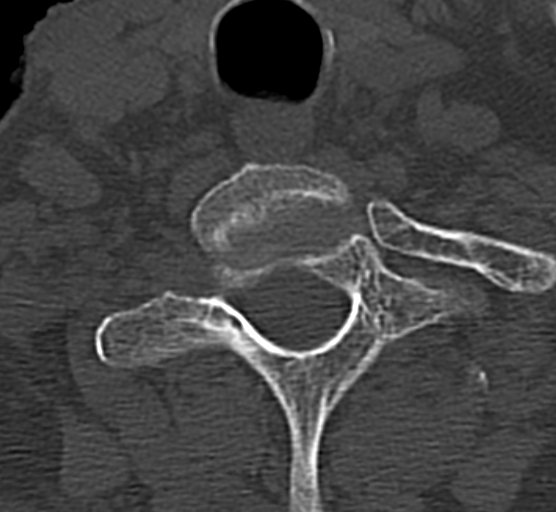
[im 39/96  bone]
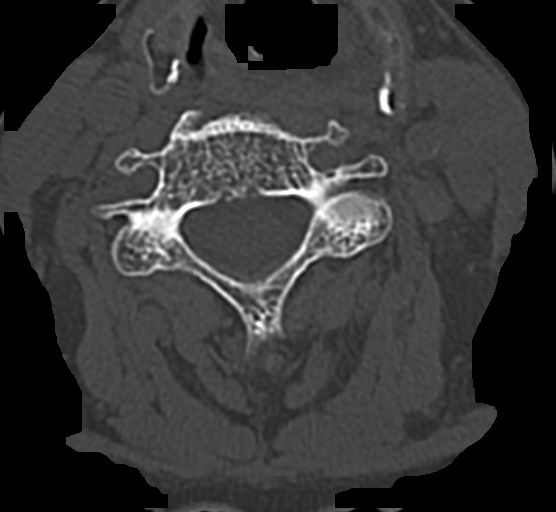
[im 58/96  bone]
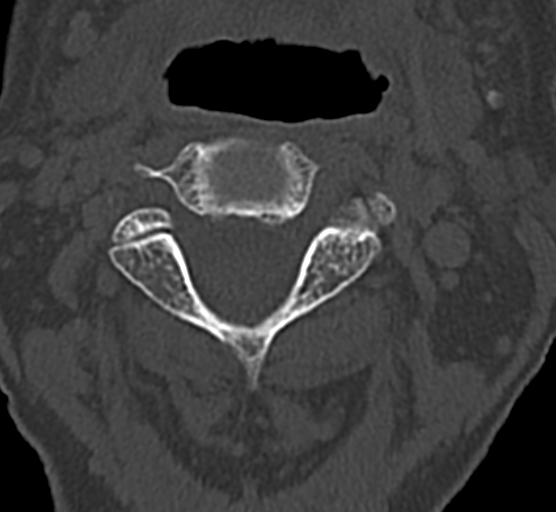
[im 77/96  bone]
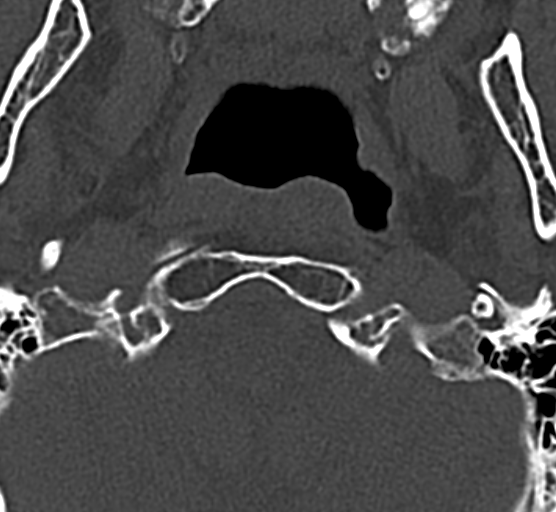

[12 of 33 positions shown; findings below may reference images not displayed]

FINDINGS: CT HEAD FINDINGS

There is mild diffuse cerebral and cerebellar atrophy with
compensatory ventriculomegaly. There is stable decreased density in
the deep white matter of both cerebral hemispheres consistent with
chronic small vessel ischemic change. Old lacunar infarctions in the
anterior aspect of the left basal ganglia and in the mid body of the
right basal ganglia are present. There is no acute ischemic change
nor acute intracranial hemorrhage. The cerebellum and brainstem are
unremarkable. The observed paranasal sinuses exhibit no air-fluid
levels. There is a stable retention cyst or polyp in the left
sphenoid sinus cell. There is no acute skull fracture.

CT CERVICAL SPINE FINDINGS

The cervical vertebral bodies are preserved in height. The
intervertebral disc space heights are well maintained. There is a
large bridging osteophyte at C5-6 on the left. There is no perched
facet nor spinous process fracture. The odontoid is intact.
IMPRESSION: 1. There is no acute intracranial hemorrhage nor other acute
intracranial abnormality. There are stable changes of chronic small
vessel ischemia and old lacunar infarctions.
2. There is no acute skull fracture.
3. There it there is no acute cervical spine fracture nor
dislocation.

## 2014-07-04 MED ORDER — ZOLPIDEM TARTRATE 5 MG PO TABS: 5.0000 mg | ORAL_TABLET | Freq: Every evening | ORAL | Status: DC | PRN

## 2014-07-04 MED ORDER — LORAZEPAM 1 MG PO TABS: 0.0000 mg | ORAL_TABLET | Freq: Two times a day (BID) | ORAL | Status: DC

## 2014-07-04 MED ORDER — VITAMIN B-1 100 MG PO TABS
100.0000 mg | ORAL_TABLET | Freq: Every day | ORAL | Status: DC
  Administered 2014-07-04: 100 mg via ORAL
  Filled 2014-07-04: qty 1

## 2014-07-04 MED ORDER — TETANUS-DIPHTH-ACELL PERTUSSIS 5-2.5-18.5 LF-MCG/0.5 IM SUSP
0.5000 mL | Freq: Once | INTRAMUSCULAR | Status: AC
  Administered 2014-07-04: 0.5 mL via INTRAMUSCULAR
  Filled 2014-07-04: qty 0.5

## 2014-07-04 MED ORDER — THIAMINE HCL 100 MG/ML IJ SOLN
100.0000 mg | Freq: Every day | INTRAMUSCULAR | Status: DC
  Filled 2014-07-04: qty 2

## 2014-07-04 MED ORDER — LIDOCAINE-EPINEPHRINE (PF) 2 %-1:200000 IJ SOLN
10.0000 mL | Freq: Once | INTRAMUSCULAR | Status: AC
  Administered 2014-07-04: 10 mL via INTRADERMAL
  Filled 2014-07-04: qty 20

## 2014-07-04 MED ORDER — ONDANSETRON HCL 4 MG PO TABS: 4.0000 mg | ORAL_TABLET | Freq: Three times a day (TID) | ORAL | Status: DC | PRN

## 2014-07-04 MED ORDER — LORAZEPAM 1 MG PO TABS
0.0000 mg | ORAL_TABLET | Freq: Four times a day (QID) | ORAL | Status: DC
  Filled 2014-07-04: qty 4

## 2014-07-04 MED ORDER — IBUPROFEN 200 MG PO TABS: 600.0000 mg | ORAL_TABLET | Freq: Three times a day (TID) | ORAL | Status: DC | PRN

## 2014-07-04 MED ORDER — NICOTINE 21 MG/24HR TD PT24
21.0000 mg | MEDICATED_PATCH | Freq: Every day | TRANSDERMAL | Status: DC
  Administered 2014-07-04: 21 mg via TRANSDERMAL
  Filled 2014-07-04: qty 1

## 2014-07-04 NOTE — ED Provider Notes (Signed)
[name]: [id number]     Arrival date & time 07/04/14  1256 History   First MD Initiated Contact with Patient 07/04/14 1615     Chief Complaint  Patient presents with  . Fall  . Facial Laceration  . Alcohol Intoxication     (Consider location/radiation/quality/duration/timing/severity/associated sxs/prior Treatment) HPI Ralph Jackson is a 68 y.o. male who presents to ED with complaining of alcohol intoxication and wanting a detox. Pt with hx of the same. Just got out of [facility] 1 month ago, states started drinking right away. Denies SI or HI. States today "I must have fallen" because reports blood on his shirt and laceration on the chin. Patient does not recall falling. He currently denies any headache, no neck pain, no other injuries. He states his last drink was this morning. Patient states that he would like to see if he could get help again. At this time denies any headache, no nausea or vomiting, no changes in vision. He does not recall what happened this morning. He denies any confusion.  Past Medical History  Diagnosis Date  . Hypertension   . Urinary tract infection   . ETOH abuse    History reviewed. No pertinent past surgical history. History reviewed. No pertinent family history. History  Substance Use Topics  . Smoking status: Never Smoker   . Smokeless tobacco: Current User  . Alcohol Use: Yes     Comment: Drinks 1/5 and 2-24oz beers, daily     Review of Systems  Constitutional: Negative for fever and chills.  Respiratory: Negative for cough, chest tightness and shortness of breath.   Cardiovascular: Negative for chest pain, palpitations and leg swelling.  Gastrointestinal: Negative for nausea, vomiting, abdominal pain, diarrhea and abdominal distention.  Genitourinary: Negative for dysuria, urgency, frequency and hematuria.  Musculoskeletal: Negative for myalgias, arthralgias, neck pain and neck stiffness.  Skin: Positive for wound. Negative for rash.   Allergic/Immunologic: Negative for immunocompromised state.  Neurological: Negative for dizziness, weakness, light-headedness, numbness and headaches.  Psychiatric/Behavioral: The patient is nervous/anxious.        Alcohol problem  All other systems reviewed and are negative.     Allergies  Review of patient's allergies indicates no known allergies.  Home Medications   Prior to Admission medications   Medication Sig Start Date End Date Taking? Authorizing Provider  lisinopril (PRINIVIL,ZESTRIL) 10 MG tablet Take 1 tablet (10 mg total) by mouth daily. For high blood pressure 05/13/14  Yes Sanjuana KavaAgnes I Nwoko, NP   BP 138/77 mmHg  Pulse 81  Temp(Src) 98 F (36.7 C) (Oral)  Resp 20  SpO2 100% Physical Exam  Constitutional: He is oriented to person, place, and time. He appears well-developed and well-nourished. No distress.  HENT:  Head: Normocephalic.  3cm gaping laceration to the chin  Eyes: Conjunctivae and EOM are normal. Pupils are equal, round, and reactive to light.  Neck: Neck supple.  Cardiovascular: Normal rate, regular rhythm and normal heart sounds.   Pulmonary/Chest: Effort normal. No respiratory distress. He has no wheezes. He has no rales.  Abdominal: Soft. Bowel sounds are normal. He exhibits no distension. There is no tenderness. There is no rebound.  Musculoskeletal: He exhibits no edema.  Neurological: He is alert and oriented to person, place, and time. No cranial nerve deficit. Coordination normal.  Skin: Skin is warm and dry.  Nursing note and vitals reviewed.   ED Course  Procedures (including critical care time) Labs Review Labs Reviewed  CBC - Abnormal; Notable for the  following:    RDW 16.8 (*)    All other components within normal limits  COMPREHENSIVE METABOLIC PANEL - Abnormal; Notable for the following:    Albumin 3.1 (*)    Alkaline Phosphatase 234 (*)    Anion gap 18 (*)    All other components within normal limits  ETHANOL - Abnormal;  Notable for the following:    Alcohol, Ethyl (B) 297 (*)    All other components within normal limits  SALICYLATE LEVEL - Abnormal; Notable for the following:    Salicylate Lvl <2.0 (*)    All other components within normal limits  ACETAMINOPHEN LEVEL  URINE RAPID DRUG SCREEN (HOSP PERFORMED)    Imaging Review No results found.   EKG Interpretation None      LACERATION REPAIR Performed by: Lottie MusselKIRICHENKO, Aryan Sparks A Authorized by: Jaynie CrumbleKIRICHENKO, Dare Sanger A Consent: Verbal consent obtained. Risks and benefits: risks, benefits and alternatives were discussed Consent given by: patient Patient identity confirmed: provided demographic data Prepped and Draped in normal sterile fashion Wound explored  Laceration Location: chin  Laceration Length: 3cm  No Foreign Bodies seen or palpated  Anesthesia: local infiltration  Local anesthetic: lidocaine 2% 2 epinephrine  Anesthetic total: 2 ml  Irrigation method: syringe Amount of cleaning: standard  Skin closure: prolene 5.0  Number of sutures: 4  Technique: simple interrupted  Patient tolerance: Patient tolerated the procedure well with no immediate complications.  MDM   Final diagnoses:  Fall  Laceration of chin, initial encounter  Alcohol intoxication, uncomplicated  Alcohol dependence with intoxication with complication    4:30 PM  Pt here for alcohol detox. Syncopal episode vs fall this morning while intoxicated. Gaping lac to the chin, will repair. Given pt intoxicated, will get CT head and c spine. Labs pending. VS normal at this time.    7:00 PM Pt medically cleared. On CIWA protocol  10:52 PM Pt assessed by TTS. Meets inpatient criteria. Will work on finding placement.   Filed Vitals:   07/04/14 1752 07/04/14 1754 07/04/14 1815 07/04/14 1845  BP: 114/80 114/80 127/81 117/76  Pulse: 116 118 91 95  Temp:      TempSrc:      Resp:  23 17 21   SpO2:  100% 99% 96%   10:59 PM Pt accepted to [facility], to Dr.  Christoper AllegraLugo  Ayodele Hartsock A Tunya Held, PA-C 07/05/14 [id number]

## 2014-07-04 NOTE — ED Notes (Signed)
Dinner tray ordered for pt

## 2014-07-04 NOTE — ED Notes (Signed)
Pt given bag meal 

## 2014-07-04 NOTE — ED Notes (Signed)
Contacted [facility] for tele-assessment

## 2014-07-04 NOTE — ED Notes (Signed)
Nicotine patch removed

## 2014-07-04 NOTE — BH Assessment (Addendum)
[facility]. Jaynie Crumbleatyana Kirichenko, PA-C [facility]

## 2014-07-04 NOTE — ED Notes (Signed)
Contacted [facility]

## 2014-07-04 NOTE — BH Assessment (Signed)
Assessment completed. Consulted Spencer Simon,PA-C who recommended inpatient criteria. [facility] per Donell SievertSpencer Simon, PA-C. Iona Coachatyana Kirichenko,PA-C has been informed of the recommendation.

## 2014-07-04 NOTE — ED Provider Notes (Signed)
Medical screening examination/treatment/procedure(s) were conducted as a shared visit with non-physician practitioner(s) and myself.  I personally evaluated the patient during the encounter.   EKG Interpretation   Date/Time:  Monday July 04 2014 17:20:55 EST Ventricular Rate:  93 PR Interval:  154 QRS Duration: 90 QT Interval:  385 QTC Calculation: 479 R Axis:   85 Text Interpretation:  Sinus rhythm Anteroseptal infarct, age indeterminate  Baseline wander in lead(s) V1 No significant change was found Confirmed by  [facility]  MD, TREY ([id number]) on 07/04/2014 5:52:1808 PM      68 year old male who presents with a fall and acute alcohol intoxication. He sustained a laceration to chin.  On exam, well appearing, nontoxic, not distressed, disheveled, normal respiratory effort, normal perfusion,small laceration to chin which has been repaired.  He requests detox for alcohol.  Plan TTS consult.    Clinical Impression: 1. Laceration of chin, initial encounter   2. Fall   3. Alcohol intoxication, uncomplicated   4. Alcohol dependence with intoxication with complication       Warnell Foresterrey Latona Krichbaum, MD 07/05/14 [phone]

## 2014-07-04 NOTE — ED Notes (Signed)
Pt states he thinks he fell today. Pt with noted dried blood to his chest. On exam puncture and laceration under chin. Bleeding is controlled. Pt states he drinks everyday. He does not remember falling and is unable to tell me any details about the fall. Pt is eating during exam. Patient shirt removed, no bruising noted to chest. No lacs to mouth. Old skin tear to right forearm. Pt states he would like help with drinking as well.

## 2014-07-04 NOTE — ED Notes (Signed)
BH counselor at bedside 

## 2014-07-04 NOTE — BH Assessment (Signed)
Tele Assessment Note   Ralph Jackson is an 68 y.o. male [facility] from alcohol. Pt denies SI, HI and AVH at this time.PT did not report any previous suicide attempts or psychiatric hospital. Pt reported that he has completed detox multiple times in the past and his most recent detox was September 2015. PT is currently not receiving any mental health treatment at this time. Pt did not share any issues with his sleep or appetite but stated at times he feels worthless. Pt denied having access to weapons or firearms and did not report any upcoming court dates or pending criminal charges. Pt did not report any physical, sexual or emotional abuse at this time.  PT is alert and oriented x3. Pt is calm and cooperative at this time. Pt maintained good eye contact throughout this assessment. Pt speech is logical and coherent. Pt mood is pleasant and his affect is congruent with his mood. Pt reported that he lives with a roommate but was unable to identify anyone as a part of his support system.   Axis I: Alcohol Use Disorder, Severe  Past Medical History:  Past Medical History  Diagnosis Date  . Hypertension   . Urinary tract infection   . ETOH abuse     History reviewed. No pertinent past surgical history.  Family History: History reviewed. No pertinent family history.  Social History:  reports that he has never smoked. He uses smokeless tobacco. He reports that he drinks alcohol. He reports that he does not use illicit drugs.  Additional Social History:  Alcohol / Drug Use History of alcohol / drug use?: Yes Longest period of sobriety (when/how long): "7 years" Substance #1 Name of Substance 1: Alcohol  1 - Age of First Use: 20  1 - Amount (size/oz): "1/5" 1 - Frequency: daily  1 - Duration: "40 years" 1 - Last Use / Amount: 07-04-14 "1/5"  CIWA: CIWA-Ar BP: 117/76 mmHg Pulse Rate: 95 Nausea and Vomiting: no nausea and no vomiting Tactile Disturbances:  none Tremor: no tremor Auditory Disturbances: not present Paroxysmal Sweats: no sweat visible Visual Disturbances: not present Anxiety: no anxiety, at ease Headache, Fullness in Head: none present Agitation: normal activity Orientation and Clouding of Sensorium: oriented and can do serial additions CIWA-Ar Total: 0 COWS:    PATIENT STRENGTHS: (choose at least two) Average or above average intelligence Capable of independent living    Allergies: No Known Allergies  Home Medications:  (Not in a hospital admission)  OB/GYN Status:  No LMP for male patient.  General Assessment Data [facility] Is this a Tele or Face-to-Face Assessment?: Face-to-Face Is this an Initial Assessment or a Re-assessment for this encounter?: Initial Assessment Living Arrangements: Non-relatives/Friends (Roommate) Can pt return to current living arrangement?: Yes Admission Status: Voluntary Is patient capable of signing voluntary admission?: Yes Transfer from: Home Referral Source: Self/Family/Friend     [facility] Living Arrangements: Non-relatives/Friends (Roommate) Name of Psychiatrist: No provider reported Name of Therapist: No provider reported.   Education Status Is patient currently in school?: No  Risk to self with the past 6 months Suicidal Ideation: No Suicidal Intent: No Is patient at risk for suicide?: No Suicidal Plan?: No Access to Means: No What has been your use of drugs/alcohol within the last 12 months?: Daily alcohol use reported. Denies illicit substance abuse.  Previous Attempts/Gestures: No How many times?: 0 Other Self Harm Risks: No self harm risk identified at this  time. Triggers for Past Attempts: None known Intentional Self Injurious Behavior: None Family Suicide History: No Recent stressful life event(s):  (No stressful events reported.) Persecutory voices/beliefs?: No Depression: No Depression Symptoms: Feeling worthless/self  pity Substance abuse history and/or treatment for substance abuse?: Yes Suicide prevention information given to non-admitted patients: Not applicable  Risk to Others within the past 6 months Homicidal Ideation: No Thoughts of Harm to Others: No Current Homicidal Intent: No Current Homicidal Plan: No Access to Homicidal Means: No Identified Victim: NA History of harm to others?: No Assessment of Violence: None Noted Violent Behavior Description: No violent behaviors observed.Pt is calm and cooperative at this time. Does patient have access to weapons?: No Criminal Charges Pending?: No Does patient have a court date: No  Psychosis Hallucinations: None noted Delusions: None noted  Mental Status Report Appear/Hygiene: In scrubs Eye Contact: Fair Motor Activity: Freedom of movement Speech: Logical/coherent Level of Consciousness: Quiet/awake Mood: Pleasant Affect: Appropriate to circumstance Anxiety Level: None Thought Processes: Coherent, Relevant Judgement: Unimpaired Orientation: Person, Place, Time, Situation Obsessive Compulsive Thoughts/Behaviors: None  Cognitive Functioning Concentration: Normal Memory: Recent Intact, Remote Intact IQ: Average Insight: Fair Impulse Control: Fair Appetite: Good Weight Loss: 0 Weight Gain: 0 Sleep: No Change Total Hours of Sleep: 8 Vegetative Symptoms: None  ADLScreening [facility]) Patient's cognitive ability adequate to safely complete daily activities?: Yes Patient able to express need for assistance with ADLs?: Yes Independently performs ADLs?: Yes (appropriate for developmental age)  Prior Inpatient Therapy Prior Inpatient Therapy: Yes Prior Therapy Dates: 7/15 and 9/15 [facility] Reason for Treatment: Detox   Prior Outpatient Therapy Prior Outpatient Therapy: No  ADL Screening (condition at time of admission) Patient's cognitive ability adequate to safely complete  daily activities?: Yes Patient able to express need for assistance with ADLs?: Yes Independently performs ADLs?: Yes (appropriate for developmental age)       Abuse/Neglect Assessment (Assessment to be complete while patient is alone) Physical Abuse: Denies Verbal Abuse: Denies Sexual Abuse: Denies Exploitation of patient/patient's resources: Denies Self-Neglect: Denies     Merchant navy officerAdvance Directives (For Healthcare) Does patient have an advance directive?: No Would patient like information on creating an advanced directive?: No - patient declined information    Additional Information 1:1 In Past 12 Months?: No CIRT Risk: No Elopement Risk: No     Disposition:  Disposition Initial Assessment Completed for this Encounter: Yes  Ralph Jackson S 07/04/2014 11:03 PM

## 2014-07-05 ENCOUNTER — Inpatient Hospital Stay (HOSPITAL_COMMUNITY)
Admission: EM | Admit: 2014-07-05 | Discharge: 2014-07-11 | DRG: 896 | Disposition: A | Discharge status: Home Health | Payer: Medicare Other | Source: Intra-hospital | Attending: Psychiatry | Admitting: Psychiatry

## 2014-07-05 ENCOUNTER — Encounter (HOSPITAL_COMMUNITY): Payer: Self-pay

## 2014-07-05 DIAGNOSIS — E43 Unspecified severe protein-calorie malnutrition: Secondary | ICD-10-CM | POA: Diagnosis present

## 2014-07-05 DIAGNOSIS — F1024 Alcohol dependence with alcohol-induced mood disorder: Secondary | ICD-10-CM

## 2014-07-05 DIAGNOSIS — Y908 Blood alcohol level of 240 mg/100 ml or more: Secondary | ICD-10-CM | POA: Diagnosis present

## 2014-07-05 DIAGNOSIS — F1010 Alcohol abuse, uncomplicated: Secondary | ICD-10-CM | POA: Diagnosis present

## 2014-07-05 DIAGNOSIS — I10 Essential (primary) hypertension: Secondary | ICD-10-CM | POA: Diagnosis present

## 2014-07-05 DIAGNOSIS — R627 Adult failure to thrive: Secondary | ICD-10-CM | POA: Diagnosis present

## 2014-07-05 DIAGNOSIS — F10220 Alcohol dependence with intoxication, uncomplicated: Secondary | ICD-10-CM | POA: Diagnosis present

## 2014-07-05 DIAGNOSIS — Z79899 Other long term (current) drug therapy: Secondary | ICD-10-CM | POA: Diagnosis not present

## 2014-07-05 DIAGNOSIS — F10239 Alcohol dependence with withdrawal, unspecified: Secondary | ICD-10-CM

## 2014-07-05 DIAGNOSIS — Z681 Body mass index (BMI) 19 or less, adult: Secondary | ICD-10-CM | POA: Diagnosis not present

## 2014-07-05 DIAGNOSIS — F329 Major depressive disorder, single episode, unspecified: Secondary | ICD-10-CM

## 2014-07-05 DIAGNOSIS — R7989 Other specified abnormal findings of blood chemistry: Secondary | ICD-10-CM | POA: Diagnosis present

## 2014-07-05 MED ORDER — LISINOPRIL 10 MG PO TABS
10.0000 mg | ORAL_TABLET | Freq: Every day | ORAL | Status: DC
  Administered 2014-07-05 – 2014-07-07 (×3): 10 mg via ORAL
  Filled 2014-07-05 (×6): qty 1

## 2014-07-05 MED ORDER — THIAMINE HCL 100 MG/ML IJ SOLN
100.0000 mg | Freq: Once | INTRAMUSCULAR | Status: AC
  Administered 2014-07-05: 100 mg via INTRAMUSCULAR
  Filled 2014-07-05: qty 2

## 2014-07-05 MED ORDER — ALUM & MAG HYDROXIDE-SIMETH 200-200-20 MG/5ML PO SUSP: 30.0000 mL | ORAL | Status: DC | PRN

## 2014-07-05 MED ORDER — TRAZODONE HCL 50 MG PO TABS
50.0000 mg | ORAL_TABLET | Freq: Every evening | ORAL | Status: DC | PRN
Start: 2014-07-05 — End: 2014-07-06
  Administered 2014-07-05 (×2): 50 mg via ORAL
  Filled 2014-07-05 (×7): qty 1

## 2014-07-05 MED ORDER — ADULT MULTIVITAMIN W/MINERALS CH
1.0000 | ORAL_TABLET | Freq: Every day | ORAL | Status: DC
  Administered 2014-07-05 – 2014-07-11 (×6): 1 via ORAL
  Filled 2014-07-05 (×9): qty 1

## 2014-07-05 MED ORDER — CHLORDIAZEPOXIDE HCL 25 MG PO CAPS
25.0000 mg | ORAL_CAPSULE | Freq: Every day | ORAL | Status: AC
  Administered 2014-07-09: 25 mg via ORAL
  Filled 2014-07-05: qty 1

## 2014-07-05 MED ORDER — CHLORDIAZEPOXIDE HCL 25 MG PO CAPS
25.0000 mg | ORAL_CAPSULE | Freq: Three times a day (TID) | ORAL | Status: AC
  Administered 2014-07-06 – 2014-07-07 (×3): 25 mg via ORAL
  Filled 2014-07-05 (×3): qty 1

## 2014-07-05 MED ORDER — CHLORDIAZEPOXIDE HCL 25 MG PO CAPS
25.0000 mg | ORAL_CAPSULE | Freq: Four times a day (QID) | ORAL | Status: AC | PRN
  Administered 2014-07-05: 25 mg via ORAL
  Filled 2014-07-05: qty 1

## 2014-07-05 MED ORDER — LOPERAMIDE HCL 2 MG PO CAPS: 2.0000 mg | ORAL_CAPSULE | ORAL | Status: AC | PRN

## 2014-07-05 MED ORDER — CHLORDIAZEPOXIDE HCL 25 MG PO CAPS
25.0000 mg | ORAL_CAPSULE | Freq: Four times a day (QID) | ORAL | Status: AC
  Administered 2014-07-05 – 2014-07-06 (×6): 25 mg via ORAL
  Filled 2014-07-05 (×6): qty 1

## 2014-07-05 MED ORDER — ONDANSETRON 4 MG PO TBDP: 4.0000 mg | ORAL_TABLET | Freq: Four times a day (QID) | ORAL | Status: AC | PRN

## 2014-07-05 MED ORDER — HYDROXYZINE HCL 25 MG PO TABS: 25.0000 mg | ORAL_TABLET | Freq: Four times a day (QID) | ORAL | Status: AC | PRN

## 2014-07-05 MED ORDER — ACETAMINOPHEN 325 MG PO TABS: 650.0000 mg | ORAL_TABLET | Freq: Four times a day (QID) | ORAL | Status: DC | PRN

## 2014-07-05 MED ORDER — VITAMIN B-1 100 MG PO TABS
100.0000 mg | ORAL_TABLET | Freq: Every day | ORAL | Status: DC
  Administered 2014-07-06 – 2014-07-11 (×5): 100 mg via ORAL
  Filled 2014-07-05 (×8): qty 1

## 2014-07-05 MED ORDER — MAGNESIUM HYDROXIDE 400 MG/5ML PO SUSP: 30.0000 mL | Freq: Every day | ORAL | Status: DC | PRN

## 2014-07-05 MED ORDER — CHLORDIAZEPOXIDE HCL 25 MG PO CAPS
25.0000 mg | ORAL_CAPSULE | ORAL | Status: AC
  Administered 2014-07-07: 25 mg via ORAL
  Filled 2014-07-05: qty 1

## 2014-07-05 NOTE — Progress Notes (Signed)
Patient ID: Lockie MolaDouglas W Blackwell, male   DOB: 02-19-1946, 68 y.o.   MRN: [id number] D: Client in bed, reports "I'm all right, I ate dinner" client denies symptoms of detox. Reports he fell prior to admission and that where the bruises come from on face and arms. Client reports "got stitches here" points to chin. A: Writer encouraged and offered client assistance to get up and shower, "I'll be all right" Staff will monitor q5815min for safety. R: Client is safe on the unit, did not attend group.

## 2014-07-05 NOTE — Tx Team (Signed)
Initial Interdisciplinary Treatment Plan   PATIENT STRESSORS: Financial difficulties Health problems Substance abuse   PROBLEM LIST: Problem List/Patient Goals Date to be addressed Date deferred Reason deferred Estimated date of resolution  ETOH dependence 07/05/2014     High risk for fall 07/05/2014                                                DISCHARGE CRITERIA:  Improved stabilization in mood, thinking, and/or behavior Motivation to continue treatment in a less acute level of care Withdrawal symptoms are absent or subacute and managed without 24-hour nursing intervention  PRELIMINARY DISCHARGE PLAN: Attend PHP/IOP Attend 12-step recovery group Return to previous living arrangement  PATIENT/FAMIILY INVOLVEMENT: This treatment plan has been presented to and reviewed with the patient, Lockie MolaDouglas W H[place]apie, and/or family member, The patient and family have been given the opportunity to ask questions and make suggestions.  JEHU-APPIAH, Aila Terra K 07/05/2014, 2:09 AM

## 2014-07-05 NOTE — Progress Notes (Signed)
Patient ID: Ralph Jackson, male   DOB: 1946/06/14, 68 y.o.   MRN: [id number]  PER STATE REGULATIONS 482.30  THIS CHART WAS REVIEWED FOR MEDICAL NECESSITY WITH RESPECT TO THE PATIENT'S ADMISSION/DURATION OF STAY.  NEXT REVIEW DATE: 07/08/14  Loura HaltBARBARA Miaisabella Bacorn, RN, BSN CASE MANAGER

## 2014-07-05 NOTE — [facility] Suicide Risk Assessment (Signed)
   Nursing information obtained from:  Patient Demographic factors:  Male, Caucasian, Unemployed, Low socioeconomic status Current Mental Status:  NA Loss Factors:  Decline in physical health, Financial problems / change in socioeconomic status Historical Factors:  Family history of mental illness or substance abuse Risk Reduction Factors:  NA Total Time spent with patient: 45 minutes  CLINICAL FACTORS:  Daily heavy drinking- history of alcohol dependence  Psychiatric Specialty Exam: Physical Exam  ROS  Blood pressure 100/70, pulse 80, temperature 98 F (36.7 C), temperature source Oral, resp. rate 14, height 5\' 1"  (1.549 m), weight 41.731 kg (92 lb), SpO2 10 %.Body mass index is 17.39 kg/(m^2).  SEE ADMIT NOTE MSE   COGNITIVE FEATURES THAT CONTRIBUTE TO RISK:  Closed-mindedness    SUICIDE RISK:   Moderate:  Frequent suicidal ideation with limited intensity, and duration, some specificity in terms of plans, no associated intent, good self-control, limited dysphoria/symptomatology, some risk factors present, and identifiable protective factors, including available and accessible social support.  PLAN OF CARE: Admit patient to inpatient unit for the purpose of detoxification/ management of withdrawal. Provide support and encouragement regarding sobriety/abstinence efforts. Will follow daily.   I certify that inpatient services furnished can reasonably be expected to improve the patient's condition.  Jaycelyn Orrison 07/05/2014, 5:20 PM

## 2014-07-05 NOTE — ED Notes (Signed)
Leaving with pelham.

## 2014-07-05 NOTE — Progress Notes (Signed)
Patient ID: Ralph MolaDouglas W [place]ello, male   DOB: 1946/03/07, 68 y.o.   MRN: [id number] Admission note: D:Patient is a voluntary admission in no acute distress for alcohol detox. Pt reports he drinks about a fifth of wine daily with last drink on 07/04/2014. Pt has laceration under his chin and multiple skin tears on bilateral forearm which pt denies how it happen. Pt denies seizures or blackout. Pt reports having a roommate but no family support. Pt denies SI/HI/AVH and pain.  A: Pt admitted to unit per protocol, skin assessment and belonging search done. Consent signed by pt. Pt educated on therapeutic milieu rules. Pt was introduced to milieu by nursing staff. Fall risk safety plan explained to the patient. Pt given walker for safety. 15 minutes checks started for safety.  R: Pt was receptive to education. Writer offered support.

## 2014-07-05 NOTE — [facility] Group Notes (Signed)
[facility] LCSW Group Therapy 07/05/2014  1:15 PM   Type of Therapy: Group Therapy  Participation Level: Did Not Attend. Patient in bed.   Samuella BruinKristin Jaklyn Alen, MSW, [employer]CSWA Clinical Social Worker [facility] [phone]

## 2014-07-05 NOTE — Progress Notes (Signed)
Adult Psychoeducational Group Note  Date:  07/05/2014 Time:  11:57 PM  Group Topic/Focus:  Wrap-Up Group:   The focus of this group is to help patients review their daily goal of treatment and discuss progress on daily workbooks.  Participation Level:  Did Not Attend  Participation Quality:  None  Affect:  Depressed, Flat and Resistant  Cognitive:  Lacking  Insight: Limited  Engagement in Group:  None  Modes of Intervention:  None accepted  Additional Comments:  Pt reported he would not be attending group this evening because "he did not feel like it."  [name], [name] 07/05/2014, 11:57 PM

## 2014-07-05 NOTE — Progress Notes (Signed)
D: Patient denies SI/HI and A/V hallucinations; patient denies all withdrawal symptoms  A: Monitored q 15 minutes; patient encouraged to attend groups; patient educated about medications; patient given medications per physician orders; patient encouraged to express feelings and/or concerns  R: Patient has been sleeping on and off all day; patient is pleasant; patient does appears to be mildly anxious and has some tremor that can be felt by touch; patient's interaction with staff and peers is appropriate; patient was able to set goal to talk with staff 1:1 when having feelings of SI; patient is taking medications as prescribed and tolerating medications

## 2014-07-05 NOTE — [facility] Group Notes (Signed)
The focus of this group is to educate the patient on the purpose and policies of crisis stabilization and provide a format to answer questions about their admission.  The group details unit policies and expectations of patients while admitted.  Patient did not attend 0900 nurse education orientation group this morning.  Patient stayed in his room. 

## 2014-07-05 NOTE — H&P (Addendum)
Psychiatric Admission Assessment Adult  Patient Identification:  Ralph Jackson Date of Evaluation:  07/05/2014 Chief Complaint:  " I've been drinking" History of Present Illness:: Patient is a 68 year old man.  He has a long history of alcohol dependence. He has been drinking heavily and daily, up to a large bottle of wine every day. He last drank yesterday, prior to coming to hospital. He states " I am getting weak and I need to go to a long term place" so he decided to come to ED. As per chart notes, patient had fallen prior to his admission, suffering laceration on chin. Patient does not remember  This event. Patient had cervical and Head CT done in ED - no acute abnormalities. He endorse some  Mild depression, which he attributes to alcohol, but he states that for the most part he does not feel depressed or sad BAL 297 upon admission. Of note, at this time his vitals are stable and he does not present with any significant  Tremors or diaphoresis. He does not appear to be in any acute distress.   Elements:  Severe, chronic alcohol dependence, drinking daily and heavily Associated Signs/Synptoms: Depression Symptoms:  insomnia, loss of energy/fatigue, weight loss, decreased appetite, (Hypo) Manic Symptoms: None  Anxiety Symptoms:  Denies  Psychotic Symptoms: Denies  PTSD Symptoms: Denies PTSD symptoms Total Time spent with patient: 45 minutes  Psychiatric Specialty Exam: Physical Exam  Review of Systems  Constitutional: Positive for weight loss. Negative for fever and chills.  Respiratory: Negative for cough and shortness of breath.   Cardiovascular: Negative for chest pain.  Gastrointestinal: Negative for nausea, vomiting, blood in stool and melena.  Genitourinary: Negative for dysuria, urgency and frequency.  Skin: Negative for rash.  Neurological: Negative for seizures.  Psychiatric/Behavioral: Positive for depression and substance abuse.    Blood pressure 100/70,  pulse 80, temperature 98 F (36.7 C), temperature source Oral, resp. rate 14, height $RemoveBe'5\' 1"'nRndWDDEQ$  (1.549 m), weight 41.731 kg (92 lb), SpO2 10 %.Body mass index is 17.39 kg/(m^2).  General Appearance: Fairly Groomed- appears undernourished   Engineer, water::  Fair  Speech:  Slow  Volume:  Decreased  Mood:  states mood is "OK", minimizes depression  Affect:  Constricted  Thought Process:  Goal Directed and Linear  Orientation:  Other:  fully alert and attentive, and does not appear internally preoccupied at this time  Thought Content:  Denies hallucinations, no delusions expressed, does not appear actively psychotic or internally preoccupied at this time  Suicidal Thoughts:  No- denies any current suicidal ideations and contracts for safety at this time   Homicidal Thoughts:  No  Memory:  recent and remote fair   Judgement:  Fair  Insight:  Fair  Psychomotor Activity:  Decreased  Concentration:  Fair  Recall:  [facility] Knowledge:Fair  Language: Good  Akathisia:  Negative  Handed:  Right  AIMS (if indicated):     Assets:  Desire for Improvement Resilience  Sleep:  Number of Hours: 3.5    Musculoskeletal: Strength & Muscle Tone: within normal limits at this time vitals are stable and he does not present with any significant  Tremors or diaphoresis Gait & Station: states we can ambulate, but slowly Patient leans: N/A  Past Psychiatric History: Diagnosis: Alcohol Dependence  Hospitalizations: Patient states he has had prior admissions for detoxification  Outpatient Care:Denies any recent outpatient psychiatric treatment  Substance Abuse Care: has been in detox facilities in the past  Self-Mutilation:  Denies   Suicidal Attempts: Denies   Violent Behaviors: Denies    Past Medical History:  As below- does not smoke , NKDA  Past Medical History  Diagnosis Date  . Hypertension   . Urinary tract infection   . ETOH abuse    Loss of Consciousness:  Denies Seizure History:  Denies any  history of withdrawal seizures Cardiac History:  Denies Allergies:  No Known Allergies PTA Medications: Prescriptions prior to admission  Medication Sig Dispense Refill Last Dose  . lisinopril (PRINIVIL,ZESTRIL) 10 MG tablet Take 1 tablet (10 mg total) by mouth daily. For high blood pressure 30 tablet 0 unknown at unknown    Previous Psychotropic Medications:  Medication/Dose  Was not taking any psychiatric medications prior to admission. Remembers having been on Antabuse years ago               Substance Abuse History in the last 12 months:  Yes.  - alcohol dependence as above. Last drank yesterday. Denies drug abuse .  Longest period of sobriety 7 years ( years ago )  Consequences of Substance Abuse: DUIs x 2, no longer drives, No seizures, (+) falls when intoxicated , (+) DTs in the past Social History:  reports that he has never smoked. He uses smokeless tobacco. He reports that he drinks alcohol. He reports that he does not use illicit drugs. Additional Social History:  Current Place of Residence:  States he lives alone in  A boarding house  Place of Birth:   Family Members: Marital Status:  Divorced Children: two adult children in their 74s   Sons:  Daughters: Relationships:no significant other at this time  Education:  HS Soil scientist Problems/Performance: Religious Beliefs/Practices: History of Abuse (Emotional/Phsycial/Sexual) Occupational Experiences; not working at this time, on [employer] History:  None. Legal History: history of DUIs in the past , denies any current legal issues Hobbies/Interests:  Family History:  States he does not know " anything about my mother". States father died years ago of " old age", one sister died in a MVA. Denies mental illness or suicides in family. Does not endorse any history of alcoholism in the family.  Results for orders placed or performed during the hospital encounter of 07/04/14 (from the past 72 hour(s))   Acetaminophen level     Status: None   Collection Time: 07/04/14  1:55 PM  Result Value Ref Range   Acetaminophen (Tylenol), Serum <15.0 10 - 30 ug/mL    Comment:        THERAPEUTIC CONCENTRATIONS VARY SIGNIFICANTLY. A RANGE OF 10-30 ug/mL MAY BE AN EFFECTIVE CONCENTRATION FOR MANY PATIENTS. HOWEVER, SOME ARE BEST TREATED AT CONCENTRATIONS OUTSIDE THIS RANGE. ACETAMINOPHEN CONCENTRATIONS >150 ug/mL AT 4 HOURS AFTER INGESTION AND >50 ug/mL AT 12 HOURS AFTER INGESTION ARE OFTEN ASSOCIATED WITH TOXIC REACTIONS.   CBC     Status: Abnormal   Collection Time: 07/04/14  1:55 PM  Result Value Ref Range   WBC 6.6 4.0 - 10.5 K/uL   RBC 4.93 4.22 - 5.81 MIL/uL   Hemoglobin 16.7 13.0 - 17.0 g/dL   HCT 48.4 39.0 - 52.0 %   MCV 98.2 78.0 - 100.0 fL   MCH 33.9 26.0 - 34.0 pg   MCHC 34.5 30.0 - 36.0 g/dL   RDW 16.8 (H) 11.5 - 15.5 %   Platelets 169 150 - 400 K/uL  Comprehensive metabolic panel     Status: Abnormal   Collection Time: 07/04/14  1:55 PM  Result Value  Ref Range   Sodium 142 137 - 147 mEq/L   Potassium 3.7 3.7 - 5.3 mEq/L   Chloride 98 96 - 112 mEq/L   CO2 26 19 - 32 mEq/L   Glucose, Bld 79 70 - 99 mg/dL   BUN 6 6 - 23 mg/dL   Creatinine, Ser 0.71 0.50 - 1.35 mg/dL   Calcium 8.5 8.4 - 10.5 mg/dL   Total Protein 6.9 6.0 - 8.3 g/dL   Albumin 3.1 (L) 3.5 - 5.2 g/dL   AST 26 0 - 37 U/L   ALT 17 0 - 53 U/L   Alkaline Phosphatase 234 (H) 39 - 117 U/L   Total Bilirubin 0.8 0.3 - 1.2 mg/dL   GFR calc non Af Amer >90 >90 mL/min   GFR calc Af Amer >90 >90 mL/min    Comment: (NOTE) The eGFR has been calculated using the CKD EPI equation. This calculation has not been validated in all clinical situations. eGFR's persistently <90 mL/min signify possible Chronic Kidney Disease.    Anion gap 18 (H) 5 - 15  Ethanol (ETOH)     Status: Abnormal   Collection Time: 07/04/14  1:55 PM  Result Value Ref Range   Alcohol, Ethyl (B) 297 (H) 0 - 11 mg/dL    Comment:        LOWEST  DETECTABLE LIMIT FOR SERUM ALCOHOL IS 11 mg/dL FOR MEDICAL PURPOSES ONLY   Salicylate level     Status: Abnormal   Collection Time: 07/04/14  1:55 PM  Result Value Ref Range   Salicylate Lvl <7.7 (L) 2.8 - 20.0 mg/dL  Urine Drug Screen     Status: None   Collection Time: 07/04/14  9:10 PM  Result Value Ref Range   Opiates NONE DETECTED NONE DETECTED   Cocaine NONE DETECTED NONE DETECTED   Benzodiazepines NONE DETECTED NONE DETECTED   Amphetamines NONE DETECTED NONE DETECTED   Tetrahydrocannabinol NONE DETECTED NONE DETECTED   Barbiturates NONE DETECTED NONE DETECTED    Comment:        DRUG SCREEN FOR MEDICAL PURPOSES ONLY.  IF CONFIRMATION IS NEEDED FOR ANY PURPOSE, NOTIFY LAB WITHIN 5 DAYS.        LOWEST DETECTABLE LIMITS FOR URINE DRUG SCREEN Drug Class       Cutoff (ng/mL) Amphetamine      1000 Barbiturate      200 Benzodiazepine   824 Tricyclics       235 Opiates          300 Cocaine          300 THC              50    Psychological Evaluations:  Assessment: Patient is a 68 year old man, with a long history of alcohol dependence. He has been drinking daily and heavily for " a long time" , up  To yesterday. At admission, his BAL was 297. He states he is tired of drinking and feeling " weaker" so he decided to seek treatment. He presents as a fair historian , and denies any psychiatric history other than mild depression which he attributes to alcohol. At this time not suicidal or homicidal or psychotic.   Currently he is not presenting with severe or significant withdrawal, and has no noticeable distal tremors, diaphoresis, and vitals are stable.   AXIS I:  Alcohol Dependence, Alcohol Withdrawal, Alcohol induced Mood Disorder , depressed AXIS II:  Deferred AXIS III:   Past Medical History  Diagnosis Date  .  Hypertension   . Urinary tract infection   . ETOH abuse    AXIS IV:  unemployed, limited support network AXIS V:  41-50 serious symptoms  Treatment  Plan/Recommendations:  See below   Treatment Plan Summary: Daily contact with patient to assess and evaluate symptoms and progress in treatment Medication management See below Current Medications:  Current Facility-Administered Medications  Medication Dose Route Frequency Provider Last Rate Last Dose  . acetaminophen (TYLENOL) tablet 650 mg  650 mg Oral Q6H PRN Laverle Hobby, PA-C      . alum & mag hydroxide-simeth (MAALOX/MYLANTA) 200-200-20 MG/5ML suspension 30 mL  30 mL Oral Q4H PRN Laverle Hobby, PA-C      . chlordiazePOXIDE (LIBRIUM) capsule 25 mg  25 mg Oral Q6H PRN Laverle Hobby, PA-C   25 mg at 07/05/14 0228  . chlordiazePOXIDE (LIBRIUM) capsule 25 mg  25 mg Oral QID Laverle Hobby, PA-C   25 mg at 07/05/14 1144   Followed by  . [START ON 07/06/2014] chlordiazePOXIDE (LIBRIUM) capsule 25 mg  25 mg Oral TID Laverle Hobby, PA-C       Followed by  . [START ON 07/07/2014] chlordiazePOXIDE (LIBRIUM) capsule 25 mg  25 mg Oral BH-qamhs Spencer E Simon, PA-C       Followed by  . [START ON 07/09/2014] chlordiazePOXIDE (LIBRIUM) capsule 25 mg  25 mg Oral Daily Laverle Hobby, PA-C      . hydrOXYzine (ATARAX/VISTARIL) tablet 25 mg  25 mg Oral Q6H PRN Laverle Hobby, PA-C      . lisinopril (PRINIVIL,ZESTRIL) tablet 10 mg  10 mg Oral Daily Laverle Hobby, PA-C   10 mg at 07/05/14 0839  . loperamide (IMODIUM) capsule 2-4 mg  2-4 mg Oral PRN Laverle Hobby, PA-C      . magnesium hydroxide (MILK OF MAGNESIA) suspension 30 mL  30 mL Oral Daily PRN Laverle Hobby, PA-C      . multivitamin with minerals tablet 1 tablet  1 tablet Oral Daily Laverle Hobby, PA-C   1 tablet at 07/05/14 0839  . ondansetron (ZOFRAN-ODT) disintegrating tablet 4 mg  4 mg Oral Q6H PRN Laverle Hobby, PA-C      . [START ON 07/06/2014] thiamine (VITAMIN B-1) tablet 100 mg  100 mg Oral Daily Laverle Hobby, PA-C      . traZODone (DESYREL) tablet 50 mg  50 mg Oral QHS,MR X 1 Laverle Hobby, PA-C   50 mg at 07/05/14  0229    Observation Level/Precautions:  15 minute checks  Laboratory:  as needed   Psychotherapy:  Support, milieu, group therapy  Medications:   On librium taper as per detox protocol  Consultations:  As needed   Discharge Concerns:  Limited support network- wants to go to Rehab after discharge  Estimated LOS: 6 days   Other:     I certify that inpatient services furnished can reasonably be expected to improve the patient's condition.   Trayton Szabo, [place] 11/3/20154:53 PM

## 2014-07-05 NOTE — Tx Team (Signed)
Interdisciplinary Treatment Plan Update (Adult) Date: 07/05/2014   Time Reviewed: 9:30 AM  Progress in Treatment: Attending groups: Continuing to assess, patient new to milieu Participating in groups: Continuing to assess, patient new to milieu Taking medication as prescribed: Yes Tolerating medication: Yes Family/Significant other contact made: No, CSW continuing to assess for appropriate contact. Patient understands diagnosis: Yes Discussing patient identified problems/goals with staff: Yes Medical problems stabilized or resolved: Yes Denies suicidal/homicidal ideation: Treatment team continuing to assess. Issues/concerns per patient self-inventory: Yes Other:  New problem(s) identified: N/A  Discharge Plan or Barriers: CSW continuing to assess. Patient new to milieu. Multiple previous admissions for detox.  Reason for Continuation of Hospitalization:  Depression Anxiety Medication Stabilization   Comments: N/A  Estimated length of stay: 3-5 days  For review of initial/current patient goals, please see plan of care.  Ralph Jackson is an 68 y.o. male [facility] requesting detox from alcohol. Pt denies SI, HI and AVH at this time.PT did not report any previous suicide attempts or psychiatric hospital. Pt reported that he has completed detox multiple times in the past and his most recent detox was September 2015. PT is currently not receiving any mental health treatment at this time. Pt did not share any issues with his sleep or appetite but stated at times he feels worthless. Pt denied having access to weapons or firearms and did not report any upcoming court dates or pending criminal charges. Pt did not report any physical, sexual or emotional abuse at this time.  PT is alert and oriented x3. Pt is calm and cooperative at this time. Pt maintained good eye contact throughout this assessment. Pt speech is logical and coherent. Pt mood is pleasant and his affect is  congruent with his mood. Pt reported that he lives with a roommate but was unable to identify anyone as a part of his support system.  Attendees:  Patient:    Family:    Physician: Dr. Jama Flavorsobos; Dr. Dub MikesLugo  07/05/2014 9:30 AM   Nursing: Quintella ReichertBeverly Knight; [place] Minersonna Shimp; Carney LivingBrittney Tyson, RN  07/05/2014 9:30 AM   Clinical Social Worker: Belenda CruiseKristin Florie Carico, LCSWA  07/05/2014 9:30 AM   Other: Juline PatchQuylle Hodnett, LCSW  07/05/2014 9:30 AM   Other: Leisa LenzValerie Enoch, Vesta [facility]er[facility] Liaison  07/05/2014 9:30 AM   Other:    Other: Santa GeneraAnne Cunningham, LCSW  07/05/2014 9:30 AM   Other:    Other:    Other:    Other:    Other:       Scribe for Treatment Team:  Samuella BruinKristin Elinora Weigand, MSW, [employer]CSWA [phone]

## 2014-07-05 NOTE — [facility] Counselor (Signed)
Adult Comprehensive Assessment  Patient ID: Lockie MolaDouglas W Clingenpeel, male DOB: Sep 11, 1945, 68 y.o. MRN: [id number]  Information Source: Information source: Patient  Current Stressors:  Educational / Learning stressors: n/a Employment / Job issues: n/a Family Relationships: n/a Surveyor, quantityinancial / Lack of resources (include bankruptcy): n/a Housing / Lack of housing: Lives in a boarding house Physical health (include injuries & life threatening diseases): Pt reports daily weakness, difficulty ambulating Social relationships: n/a Substance abuse: 1/5th of alcohol Bereavement / Loss: n/a  Living/Environment/Situation:  Living Arrangements: Non-relatives/Friends;Other (Comment) (Pt lives in a boarding house on [address] in [place]) Living conditions (as described by patient or guardian): "fine" How long has patient lived in current situation?: did not disclose What is atmosphere in current home: Comfortable  Family History:  Marital status: Divorced Divorced, when?: 30 years ago What types of issues is patient dealing with in the relationship?: n/a Additional relationship information: n/a Does patient have children?: Yes How many children?: 2 How is patient's relationship with their children?: daughter is supportive; son is in jail  Childhood History:  By whom was/is the patient raised?: Grandparents Description of patient's relationship with caregiver when they were a child: "they are good people"; reported feeling safe and cared for Patient's description of current relationship with people who raised him/her: caregivers are deceased Does patient have siblings?: Yes Number of Siblings: 1 Description of patient's current relationship with siblings: older sister; deceased Did patient suffer any verbal/emotional/physical/sexual abuse as a child?: No Did patient suffer from severe childhood neglect?: No Has patient ever been sexually abused/assaulted/raped as an adolescent or  adult?: No Was the patient ever a victim of a crime or a disaster?: No Witnessed domestic violence?: No Has patient been effected by domestic violence as an adult?: No  Education:  Highest grade of school patient has completed: 12th Currently a student?: No Learning disability?: No  Employment/Work Situation:  Employment situation: (retired) Patient's job has been impacted by current illness: No What is the longest time patient has a held a job?: 10 years Where was the patient employed at that time?: [employer] Has patient ever been in the [employer] and Companymilitary?: No Has patient ever served in Buyer, retailcombat?: No  Financial Resources:  Surveyor, quantityinancial resources: [employer] (Haematologistocial Security Benefits) Does patient have a Lawyerrepresentative payee or guardian?: No  Alcohol/Substance Abuse:  What has been your use of drugs/alcohol within the last 12 months?: 1/5 of wine daily for 40 years If attempted suicide, did drugs/alcohol play a role in this?: No Alcohol/Substance Abuse Treatment Hx: Past Tx, Inpatient If yes, describe treatment: [facility] Has alcohol/substance abuse ever caused legal problems?: Yes (DUI charges in the past)  Social Support System:  Patient's Community Support System: Production assistant, radioGood Describe Community Support System: supportive daughter and friends Type of faith/religion: n/a How does patient's faith help to cope with current illness?: n/a  Leisure/Recreation:  Leisure and Hobbies: Research scientist (medical)watch Wheel of Fortune and movies on TV  Strengths/Needs:  What things does the patient do well?: "building things" In what areas does patient struggle / problems for patient: "nothing right now"  Discharge Plan:  Does patient have access to transportation?: No Plan for no access to transportation at discharge: public transit Will patient be returning to same living situation after discharge?: Yes Currently receiving community mental health services: No If no, would patient like referral for  services when discharged?: Yes (What county?) (inpatient long-term substance abuse treatment) Does patient have financial barriers related to discharge medications?: No  Summary/Recommendations:  Lockie MolaDouglas W Wallman is  an 68 y.o. male who presents seeking detox. [facility] in [facility] past, the most recent in September. He reports drinking a fifth of wine daily for the last 40 years. He states he's had a period of sobriety that lasted around 7 years when he was in his twenties. He states he is seeking detox because he knows that he's going to die if he doesn't. Pt is requesting referral to long-term treatment facility. Pt currently resides at a boarding house on [address] [place]. Pt presents with frial posture, with signs of withdrawal such as sweating, shaky hands, and delayed processing speeds. Patient will benefit from crisis stabilization, medication evaluation, group therapy and psycho education in addition to case management for discharge planning.    Samuella BruinKristin Ariana Juul, MSW, [employer]CSWA Clinical Social Worker [facility] [phone]

## 2014-07-05 NOTE — BHH Counselor (Incomplete)
Adult Comprehensive Assessment  Patient ID: Ralph Jackson, male DOB: Jul 15, 1946, 68 y.o. MRN: [id number]  Information Source: Information source: Patient  Current Stressors:  Educational / Learning stressors: n/a Employment / Job issues: n/a Family Relationships: n/a Surveyor, quantityinancial / Lack of resources (include bankruptcy): n/a Housing / Lack of housing: Lives in a boarding house Physical health (include injuries & life threatening diseases): Pt reports daily weakness Social relationships: n/a Substance abuse: Pt reports consuming 1/5 of wine daily Bereavement / Loss: n/a  Living/Environment/Situation:  Living Arrangements: Non-relatives/Friends;Other (Comment) (Pt lives in a boarding house on [address] in [place]) Living conditions (as described by patient or guardian): "fine" How long has patient lived in current situation?: did not disclose What is atmosphere in current home: Comfortable  Family History:  Marital status: Divorced Divorced, when?: 30 years ago What types of issues is patient dealing with in the relationship?: n/a Additional relationship information: n/a Does patient have children?: Yes How many children?: 2 How is patient's relationship with their children?: daughter is supportive; son is in jail  Childhood History:  By whom was/is the patient raised?: Grandparents Description of patient's relationship with caregiver when they were a child: "they are good people"; reported feeling safe and cared for Patient's description of current relationship with people who raised him/her: caregivers are deceased Does patient have siblings?: Yes Number of Siblings: 1 Description of patient's current relationship with siblings: older sister; deceased Did patient suffer any verbal/emotional/physical/sexual abuse as a child?: No Did patient suffer from severe childhood neglect?: No Has patient ever been sexually abused/assaulted/raped as an adolescent or  adult?: No Was the patient ever a victim of a crime or a disaster?: No Witnessed domestic violence?: No Has patient been effected by domestic violence as an adult?: No  Education:  Highest grade of school patient has completed: 12th Currently a student?: No Learning disability?: No  Employment/Work Situation:  Employment situation: (retired) Patient's job has been impacted by current illness: No What is the longest time patient has a held a job?: 10 years Where was the patient employed at that time?: [employer] Has patient ever been in the [employer] and Companymilitary?: No Has patient ever served in Buyer, retailcombat?: No  Financial Resources:  Surveyor, quantityinancial resources: [employer] (Haematologistocial Security Benefits) Does patient have a Lawyerrepresentative payee or guardian?: No  Alcohol/Substance Abuse:  What has been your use of drugs/alcohol within the last 12 months?: 1/5 of wine daily for 40 years If attempted suicide, did drugs/alcohol play a role in this?: No Alcohol/Substance Abuse Treatment Hx: Past Tx, Inpatient If yes, describe treatment: [facility] Has alcohol/substance abuse ever caused legal problems?: Yes (DUI charges in the past)  Social Support System:  Patient's Community Support System: Production assistant, radioGood Describe Community Support System: supportive daughter and friends Type of faith/religion: n/a How does patient's faith help to cope with current illness?: n/a  Leisure/Recreation:  Leisure and Hobbies: Research scientist (medical)watch Wheel of Fortune and movies on TV  Strengths/Needs:  What things does the patient do well?: "building things" In what areas does patient struggle / problems for patient: "nothing right now"  Discharge Plan:  Does patient have access to transportation?: No Plan for no access to transportation at discharge: public transit Will patient be returning to same living situation after discharge?: Yes Currently receiving community mental health services: No If no, would patient like referral for  services when discharged?: Yes (What county?) (inpatient long-term substance abuse treatment) Does patient have financial barriers related to discharge medications?: No  Summary/Recommendations:  Ralph BellowDouglas W  Dorma Jackson is an 68 y.o. male who presents seeking detox. He reports drinking a fifth of wine daily for the last 40 years. He states he's had a period of sobriety that lasted around 7 years when he was in his twenties, but that his most recent period of sobriety was some time last month and only lasted about 30 days. He states he is seeking detox because he knows that he's going to die if he doesn't. Pt is requesting referral to long-term treatment facility. Pt currently resides at a boarding house [address] [place]. Pt presents with frial posture, with signs of withdrawal such as sweating, shaky hands, and delayed processing speeds. Patient will benefit from crisis stabilization, medication evaluation, group therapy and psycho education in addition to case management for discharge planning.

## 2014-07-05 NOTE — Progress Notes (Signed)
Recreation Therapy Notes  Animal-Assisted Activity/Therapy (AAA/T) Program Checklist/Progress Notes Patient Eligibility Criteria Checklist & Daily Group note for Rec Tx Intervention  Date: 11.03.2015 Time: 2:45pm Location: [address] Dayroom   AAA/T Program Assumption of Risk Form signed by Patient/ or Parent Legal Guardian yes  Patient is free of allergies or sever asthma yes  Patient reports no fear of animals yes  Patient reports no history of cruelty to animals yes   Patient understands his/her participation is voluntary yes  Behavioral Response: Did not attend.   Ryo Klang L Jadelyn Elks, LRT/CTRS  Daejah Klebba L 07/05/2014 4:21 PM 

## 2014-07-05 NOTE — Progress Notes (Signed)
Patient ID: Ralph Jackson, male   DOB: 10-Sep-1945, 68 y.o.   MRN: [id number] Received report from Carlena HurlLinda J. RN D: Client in bed eyes closed, but easily aroused. A: Writer introduced self to client, reviewed medications, administered as ordered. Staff will monitor q7415min for safety. R: Client is safe on the unit.

## 2014-07-06 MED ORDER — ENSURE COMPLETE PO LIQD
237.0000 mL | Freq: Two times a day (BID) | ORAL | Status: DC
  Administered 2014-07-10 – 2014-07-11 (×4): 237 mL via ORAL

## 2014-07-06 MED ORDER — MIRTAZAPINE 7.5 MG PO TABS
7.5000 mg | ORAL_TABLET | Freq: Every day | ORAL | Status: DC
  Administered 2014-07-07: 7.5 mg via ORAL
  Filled 2014-07-06 (×4): qty 1

## 2014-07-06 NOTE — [facility] Group Notes (Signed)
[facility] L[facility] Note  07/06/2014  8:45 AM  Participation Quality: Did Not Attend- patient in bed eating breakfast. Patient states that he does not feel well today.  Samuella BruinKristin Shivonne Schwartzman, MSW, [employer]CSWA Clinical Social Worker [facility] [phone]

## 2014-07-06 NOTE — Plan of Care (Addendum)
Problem: Ineffective individual coping Goal: Client will notify staff of symptoms withdrawal i.e. Anxiety, tremor, sweating STG: client will take medications to help alleviate symptoms of withdrawal Outcome: Progressing Client will feel comfortable talking to staff about any concerns. "I'll be okay" "I been eating"

## 2014-07-06 NOTE — Progress Notes (Signed)
D: Patient denies SI/HI and A/V hallucinations; patient denies pain and withdrawal symptoms  A: Monitored q 15 minutes; patient encouraged to attend groups; patient educated about medications; patient given medications per physician orders; patient encouraged to express feelings and/or concerns  R: Patient stays in the bed all day sleeping; patient eats minimally even though he is encouraged; patient is flat and blunted and responds to most things with one worded answers; patient is ; patient's interaction with staff and peers is appropriate; patient was able to set goal to talk with staff 1:1 when having feelings of SI; patient is taking medications as prescribed and tolerating medications; patient is not attending any groups

## 2014-07-06 NOTE — Progress Notes (Signed)
[facility] MD Progress Note  07/06/2014 1:50 PM Ralph Jackson  MRN:  [id number] Subjective:   " I am OK"  Objective :   I have discussed case with treatment team and have met with patient. Patient states he is doing "OK". He answers most questions with mono-syllables or short phrases, but denies any severe withdrawal symptoms or depression at this time. He does report, as noted in initial note, significant weight loss recently which he attributes to his heavy drinking. He denies feeling agitated or jittery, he is not presenting with any acute distress or restlessness, and peripheral/distal tremors are subtle. He is not flushed or diaphoretic. He denies headache and denies any visual hallucinations or visual disturbances. As discussed with team, his ambulation is compromised and he walks tentatively, slowly. He has been strongly encouraged to use walker at bedside and get Nursing Assistance as needed. As discussed with Education officer, museum, an option is to apply for [place] RN to assist and provide support, monitoring and will request Home Health Consultation. Also will consult Nutritionist to determine best diet to  Follow . Patient has not been going to groups and remains in room most of the time- states " I don't feel up to it yet" Denies any suicidal ideations and minimizes depression  Diagnosis:  Alcohol Dependence, Alcohol WDL  Total Time spent with patient: 25 minutes     ADL's:  Fair   Sleep: Poor   Appetite:  Fair  Suicidal Ideation:  Denies suicidal ideations Homicidal Ideation:  Denies homicidal ideations AEB (as evidenced by):  Psychiatric Specialty Exam: Physical Exam  Review of Systems  Constitutional: Positive for weight loss. Negative for fever, chills and diaphoresis.  Respiratory: Positive for cough. Negative for shortness of breath.   Cardiovascular: Negative for chest pain.  Gastrointestinal: Negative for vomiting, constipation and melena.  Genitourinary:  Negative for dysuria, urgency and frequency.  Skin: Negative for rash.  Neurological: Positive for weakness. Negative for seizures.  Psychiatric/Behavioral: Positive for substance abuse.    Blood pressure 126/99, pulse 80, temperature 98.1 F (36.7 C), temperature source Oral, resp. rate 16, height $RemoveBe'5\' 1"'VXXRETPWs$  (1.549 m), weight 41.731 kg (92 lb), SpO2 10 %.Body mass index is 17.39 kg/(m^2).  General Appearance: Fairly Groomed  Engineer, water::  Fair  Speech:  Slow  Volume:  Decreased  Mood:  patient states he feels "OK" and denies significant depression at this time  Affect:  Blunt  Thought Process:  Linear  Orientation:  Other:  at this time he is oriented to [facility], to November, to Wednesday, to 2015. No evidence of delirium or confusion at this time  Thought Content:  denies hallucinations, no delusions expressed  Suicidal Thoughts:  No- denies any SI or HI.  Homicidal Thoughts:  No  Memory:  recent and remote fair  Judgement:  Fair  Insight:  Fair  Psychomotor Activity:  Decreased  Concentration:  Fair  Recall:  Good  Fund of Knowledge:Good  Language: Fair  Akathisia:  Negative  Handed:  Right  AIMS (if indicated):     Assets:  Resilience  Sleep:  Number of Hours: 3.5   Musculoskeletal: Strength & Muscle Tone: within normal limits Gait & Station: walks slowly, tentatively. Patient leans: N/A  Current Medications: Current Facility-Administered Medications  Medication Dose Route Frequency Provider Last Rate Last Dose  . acetaminophen (TYLENOL) tablet 650 mg  650 mg Oral Q6H PRN Laverle Hobby, PA-C      . alum & mag hydroxide-simeth (MAALOX/MYLANTA) 200-200-20 MG/5ML  suspension 30 mL  30 mL Oral Q4H PRN Laverle Hobby, PA-C      . chlordiazePOXIDE (LIBRIUM) capsule 25 mg  25 mg Oral Q6H PRN Laverle Hobby, PA-C   25 mg at 07/05/14 0228  . chlordiazePOXIDE (LIBRIUM) capsule 25 mg  25 mg Oral TID Laverle Hobby, PA-C       Followed by  . [START ON 07/07/2014] chlordiazePOXIDE  (LIBRIUM) capsule 25 mg  25 mg Oral BH-qamhs Spencer E Simon, PA-C       Followed by  . [START ON 07/09/2014] chlordiazePOXIDE (LIBRIUM) capsule 25 mg  25 mg Oral Daily Laverle Hobby, PA-C      . feeding supplement (ENSURE COMPLETE) (ENSURE COMPLETE) liquid 237 mL  237 mL Oral BID BM Clayton Bibles, RD      . hydrOXYzine (ATARAX/VISTARIL) tablet 25 mg  25 mg Oral Q6H PRN Laverle Hobby, PA-C      . lisinopril (PRINIVIL,ZESTRIL) tablet 10 mg  10 mg Oral Daily Laverle Hobby, PA-C   10 mg at 07/06/14 0831  . loperamide (IMODIUM) capsule 2-4 mg  2-4 mg Oral PRN Laverle Hobby, PA-C      . magnesium hydroxide (MILK OF MAGNESIA) suspension 30 mL  30 mL Oral Daily PRN Laverle Hobby, PA-C      . multivitamin with minerals tablet 1 tablet  1 tablet Oral Daily Laverle Hobby, PA-C   1 tablet at 07/06/14 0831  . ondansetron (ZOFRAN-ODT) disintegrating tablet 4 mg  4 mg Oral Q6H PRN Laverle Hobby, PA-C      . thiamine (VITAMIN B-1) tablet 100 mg  100 mg Oral Daily Laverle Hobby, PA-C   100 mg at 07/06/14 0831  . traZODone (DESYREL) tablet 50 mg  50 mg Oral QHS,MR X 1 Laverle Hobby, PA-C   50 mg at 07/05/14 2217    Lab Results:  Results for orders placed or performed during the hospital encounter of 07/04/14 (from the past 48 hour(s))  Acetaminophen level     Status: None   Collection Time: 07/04/14  1:55 PM  Result Value Ref Range   Acetaminophen (Tylenol), Serum <15.0 10 - 30 ug/mL    Comment:        THERAPEUTIC CONCENTRATIONS VARY SIGNIFICANTLY. A RANGE OF 10-30 ug/mL MAY BE AN EFFECTIVE CONCENTRATION FOR MANY PATIENTS. HOWEVER, SOME ARE BEST TREATED AT CONCENTRATIONS OUTSIDE THIS RANGE. ACETAMINOPHEN CONCENTRATIONS >150 ug/mL AT 4 HOURS AFTER INGESTION AND >50 ug/mL AT 12 HOURS AFTER INGESTION ARE OFTEN ASSOCIATED WITH TOXIC REACTIONS.   CBC     Status: Abnormal   Collection Time: 07/04/14  1:55 PM  Result Value Ref Range   WBC 6.6 4.0 - 10.5 K/uL   RBC 4.93 4.22 - 5.81  MIL/uL   Hemoglobin 16.7 13.0 - 17.0 g/dL   HCT 48.4 39.0 - 52.0 %   MCV 98.2 78.0 - 100.0 fL   MCH 33.9 26.0 - 34.0 pg   MCHC 34.5 30.0 - 36.0 g/dL   RDW 16.8 (H) 11.5 - 15.5 %   Platelets 169 150 - 400 K/uL  Comprehensive metabolic panel     Status: Abnormal   Collection Time: 07/04/14  1:55 PM  Result Value Ref Range   Sodium 142 137 - 147 mEq/L   Potassium 3.7 3.7 - 5.3 mEq/L   Chloride 98 96 - 112 mEq/L   CO2 26 19 - 32 mEq/L   Glucose, Bld 79 70 - 99 mg/dL  BUN 6 6 - 23 mg/dL   Creatinine, Ser 0.71 0.50 - 1.35 mg/dL   Calcium 8.5 8.4 - 10.5 mg/dL   Total Protein 6.9 6.0 - 8.3 g/dL   Albumin 3.1 (L) 3.5 - 5.2 g/dL   AST 26 0 - 37 U/L   ALT 17 0 - 53 U/L   Alkaline Phosphatase 234 (H) 39 - 117 U/L   Total Bilirubin 0.8 0.3 - 1.2 mg/dL   GFR calc non Af Amer >90 >90 mL/min   GFR calc Af Amer >90 >90 mL/min    Comment: (NOTE) The eGFR has been calculated using the CKD EPI equation. This calculation has not been validated in all clinical situations. eGFR's persistently <90 mL/min signify possible Chronic Kidney Disease.    Anion gap 18 (H) 5 - 15  Ethanol (ETOH)     Status: Abnormal   Collection Time: 07/04/14  1:55 PM  Result Value Ref Range   Alcohol, Ethyl (B) 297 (H) 0 - 11 mg/dL    Comment:        LOWEST DETECTABLE LIMIT FOR SERUM ALCOHOL IS 11 mg/dL FOR MEDICAL PURPOSES ONLY   Salicylate level     Status: Abnormal   Collection Time: 07/04/14  1:55 PM  Result Value Ref Range   Salicylate Lvl <7.6 (L) 2.8 - 20.0 mg/dL  Urine Drug Screen     Status: None   Collection Time: 07/04/14  9:10 PM  Result Value Ref Range   Opiates NONE DETECTED NONE DETECTED   Cocaine NONE DETECTED NONE DETECTED   Benzodiazepines NONE DETECTED NONE DETECTED   Amphetamines NONE DETECTED NONE DETECTED   Tetrahydrocannabinol NONE DETECTED NONE DETECTED   Barbiturates NONE DETECTED NONE DETECTED    Comment:        DRUG SCREEN FOR MEDICAL PURPOSES ONLY.  IF CONFIRMATION IS  NEEDED FOR ANY PURPOSE, NOTIFY LAB WITHIN 5 DAYS.        LOWEST DETECTABLE LIMITS FOR URINE DRUG SCREEN Drug Class       Cutoff (ng/mL) Amphetamine      1000 Barbiturate      200 Benzodiazepine   283 Tricyclics       151 Opiates          300 Cocaine          300 THC              50     Physical Findings: AIMS: Facial and Oral Movements Muscles of Facial Expression: None, normal Lips and Perioral Area: None, normal Jaw: None, normal Tongue: None, normal,Extremity Movements Upper (arms, wrists, hands, fingers): None, normal Lower (legs, knees, ankles, toes): None, normal, Trunk Movements Neck, shoulders, hips: None, normal, Overall Severity Severity of abnormal movements (highest score from questions above): None, normal Incapacitation due to abnormal movements: None, normal Patient's awareness of abnormal movements (rate only patient's report): No Awareness, Dental Status Current problems with teeth and/or dentures?: No Does patient usually wear dentures?: No  CIWA:  CIWA-Ar Total: 4 COWS:     Assessment: Patient is not presenting with any severe withdrawal at this time. He has had no seizures, and is not presenting with any symptoms of DTs. He is , however, frail and undernourished and at risk for the above, so will proceed with standing BZD taper. He denies depression but does appear somewhat blunted and constricted in affect, and is isolative, withdrawn. He is also not sleeping well. Based on this, will start Remeron, which may help mood, sleep, and  promote appetite, weight gain.  Treatment Plan Summary: Daily contact with patient to assess and evaluate symptoms and progress in treatment Medication management See below  Plan: Continue inpatient treatment, milieu. Encourage group participation. Patient , as discussed with Nursing Staff, is a fall risk. Encourage safety and use of walker Continue Librium taper as per detox protocol. D/C Trazodone Start Remeron 7.5  mgrs QHS Nutritionist Consult and Home Health RN Evaluation requested.  Medical Decision Making Problem Points:  Established problem, stable/improving (1), Review of last therapy session (1) and Review of psycho-social stressors (1) Data Points:  Review of medication regiment & side effects (2) Review of new medications or change in dosage (2)  I certify that inpatient services furnished can reasonably be expected to improve the patient's condition.   Niza Soderholm 07/06/2014, 1:50 PM

## 2014-07-06 NOTE — [facility] Group Notes (Signed)
[facility] LCSW Group Therapy 07/06/2014  1:15 PM   Type of Therapy: Group Therapy  Participation Level: Did Not Attend- patient in bed.   Samuella BruinKristin Ta Fair, MSW, [employer] Clinical Social Worker [facility] [phone]

## 2014-07-06 NOTE — Progress Notes (Signed)
Adult Psychoeducational Group Note  Date:  07/06/2014 Time:  7:01 PM  Group Topic/Focus:  Wellness Toolbox:   The focus of this group is to discuss various aspects of wellness, balancing those aspects and exploring ways to increase the ability to experience wellness.  Patients will create a wellness toolbox for use upon discharge.  Participation Level:  Did Not Attend  Additional Comments:  Patient was informed that group was starting and encouraged to come.  Merleen MillinerCataldo, Astaria Nanez Y 07/06/2014, 7:01 PM

## 2014-07-06 NOTE — [facility] Group Notes (Signed)
Adult Psychoeducational Group Note  Date:  07/06/2014 Time:  9:52 PM  Group Topic/Focus:  NA Meeting  Participation Level:  Did Not Attend  Participation Quality:  None  Affect:  None  Cognitive:  None  Insight: None  Engagement in Group:  None  Modes of Intervention:  Discussion and Education  Additional Comments:  [name] did not attend group.  Caroll RancherLindsay, Breean Nannini A 07/06/2014, 9:52 PM

## 2014-07-06 NOTE — Progress Notes (Addendum)
NUTRITION ASSESSMENT  Pt identified as at risk on the Malnutrition Screen Tool  INTERVENTION: 1. Educated patient on the importance of nutrition and encouraged intake of food and beverages. Provided "Sobriety Nutrition Therapy" handout. 2. Discussed weight goals. 3. Supplements: Ensure Complete po BID, each supplement provides 350 kcal and 13 grams of protein, or more if desired.   NUTRITION DIAGNOSIS: Underweight related to ETOH abuse as evidenced by BMI of 17.4.   Goal: Pt to meet >/= 90% of their estimated nutrition needs.  Monitor:  PO intake  Assessment:  67 year old man. He has a long history of alcohol dependence. He has been drinking heavily and daily, up to a large bottle of wine every day. He last drank yesterday, prior to coming to hospital.  RD unable to obtain much history from pt. Pt reports eating fine and eating a ham and cheese and some ice cream.   RD offered to bring pt an Ensure supplement to try and pt refused. RD discussed pt with RN, RD to put in order for Ensure BID. Asked RN to encourage pt to try some and offer it to pt daily.   Pt is at nutritional risk given ETOH abuse history and visual signs of muscle and fat depletion. Weight has been stable in the 90s.   Encouraged pt to eat 3 meals a day with snacks, emphasizing protein consumption. Discussed the importance of good nutrition for mental health and aiding in recovery.   Height: Ht Readings from Last 1 Encounters:  07/05/14 5' 1" (1.549 m)    Weight: Wt Readings from Last 1 Encounters:  07/05/14 92 lb (41.731 kg)    Weight Hx: Wt Readings from Last 10 Encounters:  07/05/14 92 lb (41.731 kg)  05/05/14 92 lb (41.731 kg)  05/04/14 91 lb 8 oz (41.504 kg)  04/21/14 96 lb (43.545 kg)  03/24/14 95 lb (43.092 kg)  01/31/14 94 lb 1 oz (42.666 kg)    BMI:  Body mass index is 17.39 kg/(m^2). Pt meets criteria for underweight based on current BMI.  Estimated Nutritional Needs: Kcal: 30-35  kcal/kg Protein: > 1.2 gram protein/kg Fluid: 1 ml/kcal  Diet Order: Diet regular Pt is also offered choice of unit snacks mid-morning and mid-afternoon.  Pt is eating "pretty well" per RN.  Lab results and medications reviewed: Alk Phos elevated.   , MS, RD, LDN Pager: [phone] After Hours Pager: [phone]   

## 2014-07-07 DIAGNOSIS — F10230 Alcohol dependence with withdrawal, uncomplicated: Secondary | ICD-10-CM

## 2014-07-07 MED ORDER — ACAMPROSATE CALCIUM 333 MG PO TBEC
666.0000 mg | DELAYED_RELEASE_TABLET | Freq: Three times a day (TID) | ORAL | Status: DC
  Administered 2014-07-08 – 2014-07-10 (×7): 666 mg via ORAL
  Administered 2014-07-10: 17:00:00 via ORAL
  Administered 2014-07-10 – 2014-07-11 (×3): 666 mg via ORAL
  Filled 2014-07-07 (×7): qty 2
  Filled 2014-07-07: qty 84
  Filled 2014-07-07 (×5): qty 2
  Filled 2014-07-07: qty 84
  Filled 2014-07-07: qty 2
  Filled 2014-07-07: qty 84
  Filled 2014-07-07 (×2): qty 2

## 2014-07-07 MED ORDER — MIRTAZAPINE 15 MG PO TABS
15.0000 mg | ORAL_TABLET | Freq: Every day | ORAL | Status: DC
  Administered 2014-07-07 – 2014-07-10 (×4): 15 mg via ORAL
  Filled 2014-07-07: qty 1
  Filled 2014-07-07: qty 14
  Filled 2014-07-07 (×5): qty 1

## 2014-07-07 NOTE — Progress Notes (Signed)
Patient ID: Ralph Jackson, male   DOB: Sep 13, 1945, 68 y.o.   MRN: [id number]

## 2014-07-07 NOTE — Progress Notes (Signed)
Patient ID: Ralph Jackson, male   DOB: April 14, 1946, 68 y.o.   MRN: [id number] Pt in bed sleeping most of he shift. Pt denies and withdrawal Symptoms. Pt denies SI/HI/AVH PT IS SAFE

## 2014-07-07 NOTE — Progress Notes (Signed)
Patient remains isolative to room, bed. He did go down to cafeteria for breakfast. Ambulating with walker as he remains weak and unsteady. He denies all withdrawal, pain. Medicated per orders. Encouraged to continue with proper intake and to push fluids. Support offered and patient strongly encouraged to attend groups as he has not been doing so. Fall precautions reviewed. He confirmed understanding of above. He denies SI/HI/AVH and remains safe. Lawrence MarseillesFriedman, [place] Eakes

## 2014-07-07 NOTE — [facility] Suicide Risk Assessment (Signed)
[facility] INPATIENT:  Family/Significant Other Suicide Prevention Education  Suicide Prevention Education:  Patient Refusal for Family/Significant Other Suicide Prevention Education: The patient Ralph Jackson has refused to provide written consent for family/significant other to be provided Family/Significant Other Suicide Prevention Education during admission and/or prior to discharge.  Physician notified. SPE reviewed with patient and brochure provided.   Azzure Garabedian, [place] CarboKristin L 07/07/2014, 3:26 PM

## 2014-07-07 NOTE — Plan of Care (Signed)
Problem: Alteration in mood & ability to function due to Goal: STG: Patient verbalizes decreases in signs of withdrawal Outcome: Progressing Denying all W/D symptoms. CIWAs has been <4. Goal: STG-Patient will attend groups Outcome: Not Progressing Patient remains isolative to room, not attending groups.

## 2014-07-07 NOTE — Progress Notes (Signed)
D   Pt has isolated to his room this shift and complains of some mild withdrawal   He has mild to moderate tremors and some confusion   He continues to not be able to provide a urine specimen and has difficulty following directions A   Verbal support and encouragement given  Offered a wheelchair so pt would come out of his room more   Medications administered and effectiveness monitored    Q 15 min checks R   Pt safe at present

## 2014-07-07 NOTE — Progress Notes (Signed)
Patient ID: SUHAIL PELOQUIN, male   DOB: 22-May-1946, 68 y.o.   MRN: [id number] [facility] MD Progress Note  07/07/2014 5:02 PM KAYCEON OKI  MRN:  [id number] Subjective:  Patient states " I guess I am having a rough time" Objective :   I have discussed case with treatment team and have met with patient. Patient is a poor historian- he denies feeling as if he is in withdrawal, and he denies depression but he does state he is having " a rough time". He has difficulty explaining, but eventually did describe significant cravings to drink alcohol.  Patient's participation in milieu is limited. He tends to remain in his room. With encouragement from staff has been using his walker more consistently and has not had falls. His ambulation is still slowed and tentative. Patient is not presenting with any noticeable severe withdrawal symptoms- he is only slightly tremulous, is not diaphoretic, and is not endorsing headache . He is not agitated or restless and vitals are stable.  There is no evidence of DTs- he is not endorsing visual hallucinations or disturbances, and he is not presenting with agitation. He is partially oriented to time ( knows month, year, not day of week) and is oriented to place and person.  As discussed with team, his ambulation is compromised and he walks tentatively, slowly. He has been strongly encouraged to use walker at bedside and get Nursing Assistance as needed. As discussed with Education officer, museum, an option is to apply for [place] RN to assist and provide support, monitoring and will request Home Health Consultation. Denies medication side effects- we discussed option of Campral for cravings and he agreed . Of note, as discussed with staff, patient has been incontinent of urine in his room  Diagnosis:  Alcohol Dependence, Alcohol WDL  Total Time spent with patient: 25 minutes     ADL's:  Fair   Sleep: Poor   Appetite: improved   Suicidal Ideation:  Denies suicidal  ideations Homicidal Ideation:  Denies homicidal ideations AEB (as evidenced by):  Psychiatric Specialty Exam: Physical Exam  Review of Systems  Constitutional: Positive for weight loss. Negative for fever, chills and diaphoresis.  Respiratory: Positive for cough. Negative for shortness of breath.   Cardiovascular: Negative for chest pain.  Gastrointestinal: Negative for vomiting, constipation and melena.  Genitourinary: Negative for dysuria, urgency and frequency.  Skin: Negative for rash.  Neurological: Positive for weakness. Negative for seizures.  Psychiatric/Behavioral: Positive for substance abuse.    Blood pressure 136/83, pulse 87, temperature 97.5 F (36.4 C), temperature source Oral, resp. rate 18, height '5\' 1"'  (1.549 m), weight 41.731 kg (92 lb), SpO2 10 %.Body mass index is 17.39 kg/(m^2).  General Appearance: Fairly Groomed  Engineer, water::  Fair  Speech:  Slow  Volume:  Decreased  Mood:  patient states he feels "OK" and denies significant depression at this time  Affect:  still somewhat blunted, today does seem a little anxious, although he denies anxiety  Thought Process:  Linear  Orientation:  Other:  partially oriented to time, oriented to time and person, alert and attentive  Thought Content:  denies hallucinations, no delusions expressed  Suicidal Thoughts:  No- denies any SI or HI.  Homicidal Thoughts:  No  Memory:  recent and remote fair  Judgement:  Fair  Insight:  Fair  Psychomotor Activity:  Decreased  Concentration:  Fair  Recall:  Good  Fund of Knowledge:Good  Language: Fair  Akathisia:  Negative  Handed:  Right  AIMS (if indicated):     Assets:  Resilience  Sleep:  Number of Hours: 2.75   Musculoskeletal: Strength & Muscle Tone: within normal limits Gait & Station: walks slowly, tentatively. Patient leans: N/A  Current Medications: Current Facility-Administered Medications  Medication Dose Route Frequency Provider Last Rate Last Dose  .  acetaminophen (TYLENOL) tablet 650 mg  650 mg Oral Q6H PRN Laverle Hobby, PA-C      . alum & mag hydroxide-simeth (MAALOX/MYLANTA) 200-200-20 MG/5ML suspension 30 mL  30 mL Oral Q4H PRN Laverle Hobby, PA-C      . chlordiazePOXIDE (LIBRIUM) capsule 25 mg  25 mg Oral Q6H PRN Laverle Hobby, PA-C   25 mg at 07/05/14 0228  . chlordiazePOXIDE (LIBRIUM) capsule 25 mg  25 mg Oral BH-qamhs Laverle Hobby, PA-C       Followed by  . [START ON 07/09/2014] chlordiazePOXIDE (LIBRIUM) capsule 25 mg  25 mg Oral Daily Spencer E Simon, PA-C      . feeding supplement (ENSURE COMPLETE) (ENSURE COMPLETE) liquid 237 mL  237 mL Oral BID BM Clayton Bibles, RD   237 mL at 07/06/14 1400  . hydrOXYzine (ATARAX/VISTARIL) tablet 25 mg  25 mg Oral Q6H PRN Laverle Hobby, PA-C      . lisinopril (PRINIVIL,ZESTRIL) tablet 10 mg  10 mg Oral Daily Laverle Hobby, PA-C   10 mg at 07/07/14 1287  . loperamide (IMODIUM) capsule 2-4 mg  2-4 mg Oral PRN Laverle Hobby, PA-C      . magnesium hydroxide (MILK OF MAGNESIA) suspension 30 mL  30 mL Oral Daily PRN Laverle Hobby, PA-C      . mirtazapine (REMERON) tablet 7.5 mg  7.5 mg Oral QHS Myer Peer Franchot Pollitt, MD   7.5 mg at 07/07/14 0153  . multivitamin with minerals tablet 1 tablet  1 tablet Oral Daily Laverle Hobby, PA-C   1 tablet at 07/07/14 8676  . ondansetron (ZOFRAN-ODT) disintegrating tablet 4 mg  4 mg Oral Q6H PRN Laverle Hobby, PA-C      . thiamine (VITAMIN B-1) tablet 100 mg  100 mg Oral Daily Laverle Hobby, PA-C   100 mg at 07/07/14 7209    Lab Results:  No results found for this or any previous visit (from the past 48 hour(s)).  Physical Findings: AIMS: Facial and Oral Movements Muscles of Facial Expression: None, normal Lips and Perioral Area: None, normal Jaw: None, normal Tongue: None, normal,Extremity Movements Upper (arms, wrists, hands, fingers): None, normal Lower (legs, knees, ankles, toes): None, normal, Trunk Movements Neck, shoulders, hips:  None, normal, Overall Severity Severity of abnormal movements (highest score from questions above): None, normal Incapacitation due to abnormal movements: None, normal Patient's awareness of abnormal movements (rate only patient's report): No Awareness, Dental Status Current problems with teeth and/or dentures?: No Does patient usually wear dentures?: No  CIWA:  CIWA-Ar Total: 0 COWS:     Assessment: Patient is frail, undernourished, and his ambulation is slow and tentative.  He has a long history of severe alcohol dependence and was drinking heavily prior to admission. As such he is at risk of severe Withdrawal or DTs but at this time is not presenting with either. He is describing significant cravings to drink.  Treatment Plan Summary: Daily contact with patient to assess and evaluate symptoms and progress in treatment Medication management See below  Plan: Continue inpatient treatment, milieu. Encourage group participation. Staff to continue to  Encourage safety and use  of walker Continue Librium taper as per detox protocol. Increase Remeron  To 15 mgrs QHS Will recheck BMP , CBC  Medical Decision Making Problem Points:  Established problem, stable/improving (1), Review of last therapy session (1) and Review of psycho-social stressors (1) Data Points:  Review or order clinical lab tests (1) Review of medication regiment & side effects (2) Review of new medications or change in dosage (2)  I certify that inpatient services furnished can reasonably be expected to improve the patient's condition.   Dunya Meiners 07/07/2014, 5:02 PM

## 2014-07-07 NOTE — Progress Notes (Signed)
Patient continues to urinate on floor both in main bedroom as well as all over bathroom. Roommate reports this has been going on x 48 hours and he (roommate) has been cleaning up as patient is unable to do so himself. Urinal at bedside however patient too confused to use it. Oriented to year/date, place, person but believes it is Saturday. Denies urinary symptoms or prostate issues and per staff this is consistent with previous admits. Patient diapered and made a private room. Charge [facility], [facility] informed. Patient will also be moved close to RN station for safety. Fall risk precautions are in place. Lawrence MarseillesFriedman, Alycia [place] Eakes

## 2014-07-07 NOTE — [facility] Group Notes (Signed)
[facility] LCSW Group Therapy 07/07/2014  1:15 PM   Type of Therapy: Group Therapy  Participation Level: Did Not Attend- patient in bed.  Samuella BruinKristin Kaspian Muccio, MSW, [employer]CSWA Clinical Social Worker [facility] [phone]

## 2014-07-07 NOTE — Progress Notes (Signed)
Recreation Therapy Notes  Animal-Assisted Activity/Therapy (AAA/T) Program Checklist/Progress Notes Patient Eligibility Criteria Checklist & Daily Group note for Rec Tx Intervention  Date: 11.04.2015 Time: 2:45pm Location: [address] Dayroom    AAA/T Program Assumption of Risk Form signed by Patient/ or Parent Legal Guardian yes  Patient is free of allergies or sever asthma yes  Patient reports no fear of animals yes  Patient reports no history of cruelty to animals yes   Patient understands his/her participation is voluntary yes Behavioral Response: Did not attend.   Marykay Lexenise L Brittlyn Cloe, LRT/CTRS  Ashely Goosby L 07/07/2014 4:08 PM

## 2014-07-08 DIAGNOSIS — F1024 Alcohol dependence with alcohol-induced mood disorder: Secondary | ICD-10-CM | POA: Insufficient documentation

## 2014-07-08 DIAGNOSIS — R627 Adult failure to thrive: Secondary | ICD-10-CM | POA: Diagnosis present

## 2014-07-08 DIAGNOSIS — R7989 Other specified abnormal findings of blood chemistry: Secondary | ICD-10-CM | POA: Diagnosis present

## 2014-07-08 DIAGNOSIS — E43 Unspecified severe protein-calorie malnutrition: Secondary | ICD-10-CM | POA: Diagnosis present

## 2014-07-08 DIAGNOSIS — F1010 Alcohol abuse, uncomplicated: Secondary | ICD-10-CM

## 2014-07-08 LAB — CBC WITH DIFFERENTIAL/PLATELET
Basophils Absolute: 0 10*3/uL (ref 0.0–0.1)
Basophils Relative: 0 % (ref 0–1)
Eosinophils Absolute: 0.1 10*3/uL (ref 0.0–0.7)
Eosinophils Relative: 1 % (ref 0–5)
HEMATOCRIT: 38 % — AB (ref 39.0–52.0)
HEMOGLOBIN: 12.9 g/dL — AB (ref 13.0–17.0)
LYMPHS ABS: 1.7 10*3/uL (ref 0.7–4.0)
Lymphocytes Relative: 34 % (ref 12–46)
MCH: 33.2 pg (ref 26.0–34.0)
MCHC: 33.9 g/dL (ref 30.0–36.0)
MCV: 97.7 fL (ref 78.0–100.0)
MONO ABS: 0.7 10*3/uL (ref 0.1–1.0)
MONOS PCT: 14 % — AB (ref 3–12)
NEUTROS ABS: 2.6 10*3/uL (ref 1.7–7.7)
NEUTROS PCT: 51 % (ref 43–77)
Platelets: 172 10*3/uL (ref 150–400)
RBC: 3.89 MIL/uL — AB (ref 4.22–5.81)
RDW: 17.1 % — ABNORMAL HIGH (ref 11.5–15.5)
WBC: 5.1 10*3/uL (ref 4.0–10.5)

## 2014-07-08 LAB — BASIC METABOLIC PANEL
Anion gap: 10 (ref 5–15)
BUN: 18 mg/dL (ref 6–23)
CHLORIDE: 104 meq/L (ref 96–112)
CO2: 28 meq/L (ref 19–32)
CREATININE: 0.96 mg/dL (ref 0.50–1.35)
Calcium: 8.4 mg/dL (ref 8.4–10.5)
GFR calc non Af Amer: 84 mL/min — ABNORMAL LOW (ref >90)
Glucose, Bld: 83 mg/dL (ref 70–99)
POTASSIUM: 3.9 meq/L (ref 3.7–5.3)
Sodium: 142 mEq/L (ref 137–147)

## 2014-07-08 LAB — URINALYSIS W MICROSCOPIC (NOT AT ARMC)
Bilirubin Urine: NEGATIVE
GLUCOSE, UA: NEGATIVE mg/dL
HGB URINE DIPSTICK: NEGATIVE
KETONES UR: NEGATIVE mg/dL
LEUKOCYTES UA: NEGATIVE
Nitrite: NEGATIVE
PH: 6 (ref 5.0–8.0)
PROTEIN: NEGATIVE mg/dL
Specific Gravity, Urine: 1.019 (ref 1.005–1.030)
Urobilinogen, UA: 1 mg/dL (ref 0.0–1.0)

## 2014-07-08 LAB — VITAMIN B12: Vitamin B-12: 473 pg/mL (ref 211–911)

## 2014-07-08 LAB — TSH: TSH: 5.78 u[IU]/mL — AB (ref 0.350–4.500)

## 2014-07-08 MED ORDER — FOLIC ACID 1 MG PO TABS
1.0000 mg | ORAL_TABLET | Freq: Every day | ORAL | Status: DC
  Administered 2014-07-08 – 2014-07-11 (×4): 1 mg via ORAL
  Filled 2014-07-08 (×7): qty 1

## 2014-07-08 MED ORDER — PRO-STAT SUGAR FREE PO LIQD
30.0000 mL | Freq: Two times a day (BID) | ORAL | Status: DC
  Administered 2014-07-08 – 2014-07-11 (×6): 30 mL via ORAL
  Filled 2014-07-08 (×11): qty 30

## 2014-07-08 NOTE — Progress Notes (Signed)
Pt has been in bed most of the day.  He has not been up for groups or down for meals. His bp has been dropping some down 85/54.  Informed Dr. Jama Flavorsobos made him aware.  A hospitalist did come to patient today.  Pt was weight down 2 lbs from his admission but his vital signs were fairly good 116/78 pulse 75 sitting and 117/75 pulse 80 standing.  An order placed for PT to come evaluate and treat.  He was ordered a feeding supplement pro-stat sugar free 64 liquid 30 ml twice a day.  Pt did agree to drink it in some ginger ale and refused any ensure he stated,"that makes my stomach hurt I will not drink it no"  He has been eating well just not on schedule he ate his breakfast around 1000 ate lunch at 3:00p and plans to eat dinner at 1930.  He rated his depression and hopelessness both 5 and denied anxiety on his self-inventory. He denied any S/H ideation or A/V/H/.

## 2014-07-08 NOTE — Plan of Care (Signed)
Problem: Alteration in mood & ability to function due to Goal: STG-Patient will attend groups Outcome: Not Progressing Pt is refusing to go to the groups or any activities on the unit.

## 2014-07-08 NOTE — Tx Team (Signed)
Interdisciplinary Treatment Plan Update (Adult) Date: 07/08/2014   Time Reviewed: 9:30 AM  Progress in Treatment: Attending groups: No Participating in groups: No Taking medication as prescribed: Yes Tolerating medication: Yes Family/Significant other contact made: No, patient has declined for CSW to contact family Patient understands diagnosis: Yes Discussing patient identified problems/goals with staff: Yes Medical problems stabilized or resolved: Yes Denies suicidal/homicidal ideation: Yes, denies Issues/concerns per patient self-inventory: Yes Other:  New problem(s) identified: N/A  Discharge Plan or Barriers: CSW continuing to assess, multiple previous admissions for detox. Patient weak and deconditioned, incontinent and has difficulty ambulating. Psychiatrist to consult Hospitalist regarding possible medical admission.  Reason for Continuation of Hospitalization:  Depression Anxiety Medication Stabilization   Comments: N/A  Estimated length of stay: 3-5 days  For review of initial/current patient goals, please see plan of care.  Ralph Jackson is an 68 y.o. male [facility] from alcohol. Pt denies SI, HI and AVH at this time.PT did not report any previous suicide attempts or psychiatric hospital. Pt reported that he has completed detox multiple times in the past and his most recent detox was September 2015. PT is currently not receiving any mental health treatment at this time. Pt did not share any issues with his sleep or appetite but stated at times he feels worthless. Pt denied having access to weapons or firearms and did not report any upcoming court dates or pending criminal charges. Pt did not report any physical, sexual or emotional abuse at this time.  PT is alert and oriented x3. Pt is calm and cooperative at this time. Pt maintained good eye contact throughout this assessment. Pt speech is logical and coherent. Pt mood is pleasant and  his affect is congruent with his mood. Pt reported that he lives with a roommate but was unable to identify anyone as a part of his support system.  Attendees:  Patient:    Family:    Physician: Dr. Jama Flavorsobos; Dr. Dub MikesLugo  07/08/2014 9:30 AM   Nursing: Quintella ReichertBeverly Knight; Robbie LouisVivian Kent; Kathi SimpersSarah Twyman, RN  07/08/2014 9:30 AM   Clinical Social Worker: Belenda CruiseKristin Chiante Peden, LCSWA  07/08/2014 9:30 AM   Other: Juline PatchQuylle Hodnett, LCSW  07/08/2014 9:30 AM   Other: Leisa LenzValerie Enoch, Vesta MixerMonarch Liaison  07/08/2014 9:30 AM   Other: Onnie BoerJennifer Clark, Case Manager 07/08/2014 9:30 AM   Other: Santa GeneraAnne Cunningham, LCSW  07/08/2014 9:30 AM   Other: Caroline SaugerElaina P., Pharmacist 07/08/2014 9:30 AM  Other:    Other:    Other:    Other:     Scribe for Treatment Team:  Samuella BruinKristin Felipe Paluch, MSW, [employer]CSWA [phone]

## 2014-07-08 NOTE — Consult Note (Signed)
Requesting physician: Dr. Jama Flavorsobos  Reason for consultation: Failure to thrive   History of Present Illness: 68 year old alcoholic male was [facility]. Patient has history of essential hypertension. Patient has had previous hospitalization to behavioral health for alcohol intoxication and detox. Eating this hospitalization he is found to be quite weak and malnourished with poor by mouth intake. He was also found to have low blood pressure yesterday. Given his failure to thrive hospitalist consulted for evaluation and medical recommendations. Patient reports that he lives by himself and normally eats outside. He reports poor appetite and drinks a bottle of wine every day for past several years. He denies any history of withdrawal symptoms or DTs. He denies any other substance use. He is unaware of significant weight loss, fall or any injury sustained. He denies any fever, chills, headache, dizziness, blurred vision, nausea, vomiting, chest pain, palpitations, shortness of breath, abdominal pain, bowel or urinary symptoms, joint pains. He denies any tremors. He does report feeling weak. The nurses at the facility have been assisting him with his meals as she has poor by mouth intake. Blood work sent was unremarkable except for TSH of 5.78.    Allergies:  No Known Allergies    Past Medical History  Diagnosis Date  . Hypertension   . Urinary tract infection   . ETOH abuse     History reviewed. No pertinent past surgical history.   Family history: Not obtained, nonrelevant  Medications:  Scheduled Meds: . acamprosate  666 mg Oral TID WC  . chlordiazePOXIDE  25 mg Oral BH-qamhs   Followed by  . [START ON 07/09/2014] chlordiazePOXIDE  25 mg Oral Daily  . feeding supplement (ENSURE COMPLETE)  237 mL Oral BID BM  . feeding supplement (PRO-STAT SUGAR FREE 64)  30 mL Oral BID  . lisinopril  10 mg Oral Daily  . mirtazapine  15 mg Oral QHS  .  multivitamin with minerals  1 tablet Oral Daily  . thiamine  100 mg Oral Daily   Continuous Infusions:  PRN Meds:.acetaminophen, alum & mag hydroxide-simeth, magnesium hydroxide  Social History:  reports that he has never smoked. He uses smokeless tobacco. He reports that he drinks alcohol. He reports that he does not use illicit drugs.   Review of Systems:  Constitutional: Denies fever, chills, diaphoresis, appetite change and fatigue HEENT: denies visual or hearing symptoms , congestion,  trouble swallowing, neck pain,  Respiratory: Denies SOB, DOE, cough, chest tightness,  and wheezing.   Cardiovascular: Denies chest pain, palpitations and leg swelling.  Gastrointestinal: Denies nausea, vomiting, abdominal pain, diarrhea, constipation, blood in stool and abdominal distention.  Genitourinary: Denies dysuria,  hematuria, flank pain and difficulty urinating.  Endocrine: Denies: hot or cold intolerance,  polyuria, polydipsia. Musculoskeletal: Denies myalgias, back pain, , arthralgias and gait problem.  Skin: Denies , rash and wound.  Neurological: weakness+ ,Denies dizziness, seizures, syncope,  light-headedness, numbness and headaches.  Psychiatric/Behavioral: Denies suicidal ideation,depression,confusion,   Physical Exam:  Filed Vitals:   07/08/14 0901 07/08/14 0911 07/08/14 1149 07/08/14 1150  BP: 117/75  125/72 121/82  Pulse: 80  79 89  Temp:      TempSrc:      Resp:      Height:      Weight:  40.824 kg (90 lb)    SpO2:        No intake or output data in the 24 hours ending 07/08/14 1551  General: elderly thin built male in  no acute distress HEENT: Temporal wasting, no pallor, no icterus, moist oral mucosa,  no cervical lymphadenopathy, or dentition Heart: Regular rate and rhythm, without murmurs, rubs, gallops. Lungs: Clear to auscultation bilaterally. Abdomen: Soft, nontender, nondistended, positive bowel sounds. Extremities: warm, no edema Neuro: alert and oriented,  no tremors  Labs on Admission:  CBC:    Component Value Date/Time   WBC 5.1 07/08/2014 0624   HGB 12.9* 07/08/2014 0624   HCT 38.0* 07/08/2014 0624   PLT 172 07/08/2014 0624   MCV 97.7 07/08/2014 0624   NEUTROABS 2.6 07/08/2014 0624   LYMPHSABS 1.7 07/08/2014 0624   MONOABS 0.7 07/08/2014 0624   EOSABS 0.1 07/08/2014 0624   BASOSABS 0.0 07/08/2014 0624    Basic Metabolic Panel:    Component Value Date/Time   NA 142 07/08/2014 0624   K 3.9 07/08/2014 0624   CL 104 07/08/2014 0624   CO2 28 07/08/2014 0624   BUN 18 07/08/2014 0624   CREATININE 0.96 07/08/2014 0624   GLUCOSE 83 07/08/2014 0624   CALCIUM 8.4 07/08/2014 0624    Radiological Exams on Admission: No results found.  Assessment/Plan Failure to thrive -Patient appears malnourished and cachectic. Reportedly has poor by mouth intake. He does not appear to be in alcohol withdrawal. -Seen by nutritionist during this admission and recommended supplements. Patient encouraged on by mouth intake.Likely needs assistance and encouragement while he is eating. -recommend physical therapy evaluation. Patient lives alone . Recommend social worker consult to assist with discharge. Patient would likely benefit from either skilled nursing facility or assisted living.  Alcohol abuse No signs of withdrawal. Continue Librium, thiamine and multivitamin. We'll add folic acid.  Hypertension On lisinopril.given episode of low blood pressure I will hold it at this time.  Elevated TSH Check free T3 and T4, possibly be sick euthyroid.   No further medical recommendations. Will sign off.  please consult for any questions   Time Spent on Admission: 45 minutes  Zareena Willis 07/08/2014, 3:51 PM

## 2014-07-08 NOTE — [facility] Group Notes (Signed)
[facility] LCSW Group Therapy 07/08/2014  1:15 PM   Type of Therapy: Group Therapy  Participation Level: Did Not Attend.   Mikyla Schachter, MSW, LCSWA Clinical Social Worker [facility] [phone]    

## 2014-07-08 NOTE — Progress Notes (Signed)
Adult Psychoeducational Group Note  Date:  08/06/14 Time:  9:00 PM  Group Topic/Focus:  Wrap-Up Group:   The focus of this group is to help patients review their daily goal of treatment and discuss progress on daily workbooks.  Participation Level:  Did Not Attend  Participation Quality:  Resistant  Affect:  Flat and Lethargic  Cognitive:  Confused  Insight: None  Engagement in Group:  Resistant  Modes of Intervention:  Limit-setting  Additional Comments:  Pt did not attend wrap up group this evening.  Pt reports he will not go to any group because he can not get out of bed.  MHT offered assistance with getting to group via a wheelchair and pt still refused.  [name], Glover Capano L 07/08/2014, 12:57 AM

## 2014-07-08 NOTE — Progress Notes (Signed)
D: MHT assisted pt to bathroom for urine specimen.  Pt was able to use walker and hand rail to steady himself without assistance.  A:  Pt monitor for fall risk while using bathroom.  R: Pt is safe.

## 2014-07-08 NOTE — Progress Notes (Signed)
D: Pt refused assistance with incontinence and bathing when room and pt smelled like ammonia. A:  MHT empty urinal and report to RN for further assistance with pt.  R: Pt is safe.

## 2014-07-08 NOTE — Progress Notes (Signed)
Pt received bedbath after much encouragement  He was changed and put in a clean bed   He is wearing depends  He did say he felt better after the bath

## 2014-07-08 NOTE — Progress Notes (Addendum)
Patient ID: Ralph Jackson, male   DOB: Sep 02, 1946, 68 y.o.   MRN: [id number] [facility] MD Progress Note  07/08/2014 2:55 PM Ralph Jackson  MRN:  [id number] Subjective:  Patient states he feels "OK", no complaints or concerns forwarded. Objective :   Have discussed case with treatment team and met with patient. There is concern from staff about patient's overall frailty and potential " failure to thrive". Patient has , in spite of nutritional adjustments, lost two pounds since admission. As per nursing staff , he eats intermittently " sometimes pretty well and sometimes only with a lot of encouragement".  Of note, patient denies diarrhea, vomiting or any other obvious cause of weight loss. Patient does not appear to be in any acute distress, and does not appear to be presenting with alcohol withdrawal- no significant tremors, no diaphoresis, no agitation or restlessness, stable vitals. Of note,  He is not orthostatic.  Labs , as below, were reviewed and are unremarkable, except for mildly elevated TSH. * Nursing Staff reports that patient has had similar presentation during prior admissions, with incontinence and poor ambulation.p Have discussed case with staff/SW- patient may have difficulty living independently at this time, and may benefit from SNF or Assisted Living , if possible. Patient states he would agree to this. Have discussed case with Hospitalist  Poor participation in groups and milieu   Diagnosis:  Alcohol Dependence, Alcohol WDL  Total Time spent with patient: 25 minutes     ADL's:  Fair   Sleep: Poor   Appetite: improved   Suicidal Ideation:  Denies suicidal ideations Homicidal Ideation:  Denies homicidal ideations AEB (as evidenced by):  Psychiatric Specialty Exam: Physical Exam  Review of Systems  Constitutional: Positive for weight loss. Negative for fever, chills and diaphoresis.  Respiratory: Positive for cough. Negative for shortness of breath.    Cardiovascular: Negative for chest pain.  Gastrointestinal: Negative for vomiting, constipation and melena.  Genitourinary: Negative for dysuria, urgency and frequency.  Skin: Negative for rash.  Neurological: Positive for weakness. Negative for seizures.  Psychiatric/Behavioral: Positive for substance abuse.    Blood pressure 121/82, pulse 89, temperature 98.2 F (36.8 C), temperature source Oral, resp. rate 16, height '5\' 1"'  (1.549 m), weight 40.824 kg (90 lb), SpO2 10 %.Body mass index is 17.01 kg/(m^2).  General Appearance: Fairly Groomed  Engineer, water::  Fair  Speech:  Slow  Volume:  Decreased  Mood:  patient states he feels "OK" and denies significant depression at this time  Affect:  Blunt  Thought Process:  Linear  Orientation:  Other:  partially oriented to time, oriented to person and place, obtaining a full MMSE is difficult at this time due to lack of cooperation.   Thought Content:  denies hallucinations, no delusions expressed  Suicidal Thoughts:  No- denies any SI or HI.  Homicidal Thoughts:  No  Memory:  recent and remote fair recall 3/3 immediate and 1/3 at 3 minutes, able to spell W-[id number], but states " I can't do it" when asked to spell backward  Judgement:  Fair  Insight:  Fair  Psychomotor Activity:  Decreased  Concentration:  Fair  Recall:  [place] of Knowledge:Good  Language: Fair  Akathisia:  Negative  Handed:  Right  AIMS (if indicated):     Assets:  Resilience  Sleep:  Number of Hours: 2.75   Musculoskeletal: Strength & Muscle Tone: within normal limits Gait & Station: walks slowly, tentatively. Patient leans: N/A  Current Medications: Current Facility-Administered  Medications  Medication Dose Route Frequency Provider Last Rate Last Dose  . acamprosate (CAMPRAL) tablet 666 mg  666 mg Oral TID WC Jenne Campus, MD   666 mg at 07/08/14 1149  . acetaminophen (TYLENOL) tablet 650 mg  650 mg Oral Q6H PRN Laverle Hobby, PA-C      . alum & mag  hydroxide-simeth (MAALOX/MYLANTA) 200-200-20 MG/5ML suspension 30 mL  30 mL Oral Q4H PRN Laverle Hobby, PA-C      . chlordiazePOXIDE (LIBRIUM) capsule 25 mg  25 mg Oral BH-qamhs Laverle Hobby, PA-C   25 mg at 07/07/14 2118   Followed by  . [START ON 07/09/2014] chlordiazePOXIDE (LIBRIUM) capsule 25 mg  25 mg Oral Daily Spencer E Simon, PA-C      . feeding supplement (ENSURE COMPLETE) (ENSURE COMPLETE) liquid 237 mL  237 mL Oral BID BM Clayton Bibles, RD   237 mL at 07/06/14 1400  . feeding supplement (PRO-STAT SUGAR FREE 64) liquid 30 mL  30 mL Oral BID Barton Dubois, MD   30 mL at 07/08/14 1136  . lisinopril (PRINIVIL,ZESTRIL) tablet 10 mg  10 mg Oral Daily Laverle Hobby, PA-C   10 mg at 07/07/14 3016  . magnesium hydroxide (MILK OF MAGNESIA) suspension 30 mL  30 mL Oral Daily PRN Laverle Hobby, PA-C      . mirtazapine (REMERON) tablet 15 mg  15 mg Oral QHS Jenne Campus, MD   15 mg at 07/07/14 2119  . multivitamin with minerals tablet 1 tablet  1 tablet Oral Daily Laverle Hobby, PA-C   1 tablet at 07/07/14 0109  . thiamine (VITAMIN B-1) tablet 100 mg  100 mg Oral Daily Laverle Hobby, PA-C   100 mg at 07/07/14 3235    Lab Results:  Results for orders placed or performed during the hospital encounter of 07/05/14 (from the past 48 hour(s))  Urinalysis with microscopic     Status: Abnormal   Collection Time: 07/08/14  1:43 AM  Result Value Ref Range   Color, Urine YELLOW YELLOW   APPearance CLEAR CLEAR   Specific Gravity, Urine 1.019 1.005 - 1.030   pH 6.0 5.0 - 8.0   Glucose, UA NEGATIVE NEGATIVE mg/dL   Hgb urine dipstick NEGATIVE NEGATIVE   Bilirubin Urine NEGATIVE NEGATIVE   Ketones, ur NEGATIVE NEGATIVE mg/dL   Protein, ur NEGATIVE NEGATIVE mg/dL   Urobilinogen, UA 1.0 0.0 - 1.0 mg/dL   Nitrite NEGATIVE NEGATIVE   Leukocytes, UA NEGATIVE NEGATIVE   Crystals CA OXALATE CRYSTALS (A) NEGATIVE    Comment: Performed at [facility] metabolic panel      Status: Abnormal   Collection Time: 07/08/14  6:24 AM  Result Value Ref Range   Sodium 142 137 - 147 mEq/L   Potassium 3.9 3.7 - 5.3 mEq/L   Chloride 104 96 - 112 mEq/L   CO2 28 19 - 32 mEq/L   Glucose, Bld 83 70 - 99 mg/dL   BUN 18 6 - 23 mg/dL   Creatinine, Ser 0.96 0.50 - 1.35 mg/dL   Calcium 8.4 8.4 - 10.5 mg/dL   GFR calc non Af Amer 84 (L) >90 mL/min   GFR calc Af Amer >90 >90 mL/min    Comment: (NOTE) The eGFR has been calculated using the CKD EPI equation. This calculation has not been validated in all clinical situations. eGFR's persistently <90 mL/min signify possible Chronic Kidney Disease.    Anion gap 10 5 -  15    Comment: Performed at [facility]  CBC with Differential     Status: Abnormal   Collection Time: 07/08/14  6:24 AM  Result Value Ref Range   WBC 5.1 4.0 - 10.5 K/uL   RBC 3.89 (L) 4.22 - 5.81 MIL/uL   Hemoglobin 12.9 (L) 13.0 - 17.0 g/dL   HCT 38.0 (L) 39.0 - 52.0 %   MCV 97.7 78.0 - 100.0 fL   MCH 33.2 26.0 - 34.0 pg   MCHC 33.9 30.0 - 36.0 g/dL   RDW 17.1 (H) 11.5 - 15.5 %   Platelets 172 150 - 400 K/uL   Neutrophils Relative % 51 43 - 77 %   Neutro Abs 2.6 1.7 - 7.7 K/uL   Lymphocytes Relative 34 12 - 46 %   Lymphs Abs 1.7 0.7 - 4.0 K/uL   Monocytes Relative 14 (H) 3 - 12 %   Monocytes Absolute 0.7 0.1 - 1.0 K/uL   Eosinophils Relative 1 0 - 5 %   Eosinophils Absolute 0.1 0.0 - 0.7 K/uL   Basophils Relative 0 0 - 1 %   Basophils Absolute 0.0 0.0 - 0.1 K/uL    Comment: Performed at [facility]  Vitamin B12     Status: None   Collection Time: 07/08/14  6:24 AM  Result Value Ref Range   Vitamin B-12 473 211 - 911 pg/mL    Comment: Performed at [facility]  TSH     Status: Abnormal   Collection Time: 07/08/14  6:24 AM  Result Value Ref Range   TSH 5.780 (H) 0.350 - 4.500 uIU/mL    Comment: Performed at [facility]    Physical Findings: AIMS: Facial and Oral Movements Muscles of  Facial Expression: None, normal Lips and Perioral Area: None, normal Jaw: None, normal Tongue: None, normal,Extremity Movements Upper (arms, wrists, hands, fingers): None, normal Lower (legs, knees, ankles, toes): None, normal, Trunk Movements Neck, shoulders, hips: None, normal, Overall Severity Severity of abnormal movements (highest score from questions above): None, normal Incapacitation due to abnormal movements: None, normal Patient's awareness of abnormal movements (rate only patient's report): No Awareness, Dental Status Current problems with teeth and/or dentures?: No Does patient usually wear dentures?: No  CIWA:  CIWA-Ar Total: 8 COWS:     Assessment: Patient  Has continued to lose weight , and spends most time in his bed, with very limited interaction in milieu at this time. He is not presenting with any ongoing withdrawal or any clear symptoms of DTs ( not agitated, vitals stable, not hallucinating, generally oriented ) He is at baseline frail, undernourished, and as discussed with Nursing Staff who know him from prior admissions, gradually declining in the context of his severe alcohol dependence. He is incontinent and his gait is slow, but I don't think he has Wernicke's- incontinence and poor gait have been present during prior admissions and may be close or near to his baseline. Vitals are stable, he is not orthostatic, and labs are unremarkable except for elevated TSH. To what degree a possible underlying hypothyroidism is contributing to current presentation is unclear. Writer and staff agree that patient would ideally benefit from SNF or NH.   Treatment Plan Summary: Daily contact with patient to assess and evaluate symptoms and progress in treatment Medication management See below  Plan: Continue inpatient treatment, milieu. Encourage group participation. Encourage PO intake  Staff to continue to  [employer] safety and use of walker Remeron 15  mgrs QHS Campral 666  mgrs TID Will re contact Hospitalist to request help with hypothyoridism management Will request T3, FT4 PT Consult requested.   Medical Decision Making Problem Points:  Established problem, stable/improving (1), Review of last therapy session (1) and Review of psycho-social stressors (1) Data Points:  Review or order clinical lab tests (1) Review of medication regiment & side effects (2) Review of new medications or change in dosage (2)  I certify that inpatient services furnished can reasonably be expected to improve the patient's condition.   COBOS, FERNANDO 07/08/2014, 2:55 PM

## 2014-07-08 NOTE — [facility] Group Notes (Signed)
[facility]  07/08/2014  8:45 AM  Participation Quality: Did Not Attend- patient in bed.  Samuella BruinKristin Lakhia Gengler, MSW, [employer]CSWA Clinical Social Worker [facility] [phone]

## 2014-07-08 NOTE — Progress Notes (Signed)
Patient ID: Ralph Jackson, male   DOB: [birth year], 68 y.o.   MRN: [id number] PER STATE REGULATIONS 482.30  THIS CHART WAS REVIEWED FOR MEDICAL NECESSITY WITH RESPECT TO THE PATIENT'S ADMISSION/ DURATION OF STAY.  NEXT REVIEW DATE:  07/12/2014 Willa RoughJENNIFER JONES [name], RN, BSN CASE MANAGER

## 2014-07-08 NOTE — Progress Notes (Signed)
Received called from Dr. 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 [facility] Patient VS essentially stable and normal labs. Has recommended ensure TID, Prostat BID and encourage PO intake. No antihypertensive meds at this point. If despite this intervention patient remains orthostatic or with signs of dehydration (which ar not present currently); will benefit of ED evaluation and probably hospital admission for rehydration. Otherwise, patient will need control environment for recovery (like SNF). Will be available if further assistance needed. Thanks.Gwenlyn Perking.  Karyna Bessler, Mikle Boswortharlos [phone]

## 2014-07-09 DIAGNOSIS — F10959 Alcohol use, unspecified with alcohol-induced psychotic disorder, unspecified: Secondary | ICD-10-CM

## 2014-07-09 DIAGNOSIS — F321 Major depressive disorder, single episode, moderate: Secondary | ICD-10-CM

## 2014-07-09 NOTE — Evaluation (Signed)
Physical Therapy Evaluation Patient Details Name: Ralph Jackson MRN: [id number] DOB: 1946/08/28 Today's Date: 07/09/2014   History of Present Illness  68 year old male [facility] from [facility]. Patient reported drinking a fifth of wine daily for the last 40 years.  [facility]  Clinical Impression  Pt admitted with hx of falls at home and inability to self care.  Pt presents with functional mobility limitations 2* ambulatory balance deficits and generalized weakness.  Pt would benefit greatly from follow up rehab at SNF level to maximize safety and independence.    Follow Up Recommendations SNF    Equipment Recommendations  Rolling walker with 5" wheels (Pt is 5'1, 90 lbs and will need youth level RW if d/c home.)    Recommendations for Other Services       Precautions / Restrictions Precautions Precautions: Fall Restrictions Weight Bearing Restrictions: No      Mobility  Bed Mobility Overal bed mobility: Modified Independent                Transfers Overall transfer level: Needs assistance Equipment used: Rolling walker (2 wheeled) Transfers: Sit to/from Stand Sit to Stand: Min guard         General transfer comment: saftey cues for transition position and use of UES to self assist  Ambulation/Gait Ambulation/Gait assistance: Min assist;Supervision Ambulation Distance (Feet): 200 Feet Assistive device: Rolling walker (2 wheeled) Gait Pattern/deviations: Step-through pattern;Decreased step length - right;Decreased step length - left;Shuffle;Trunk flexed;Narrow base of support Gait velocity: decr   General Gait Details: General instability with narrow base of support and pt stepping tentatively while reaching for rails/furniture for stability.  Pt ambulated 50' with min HHA assist and additional 200' with RW, min guard and cues for posture and position from RW.  Stairs             Wheelchair Mobility    Modified Rankin (Stroke Patients Only)       Balance                                             Pertinent Vitals/Pain Pain Assessment: No/denies pain    Home Living Family/patient expects to be discharged to:: Skilled nursing facility                 Additional Comments: Pt states he can not care for himself and needs to "be placed somewhere long term"    Prior Function Level of Independence: Independent         Comments: hx of falls at home     Hand Dominance   Dominant Hand: Right    Extremity/Trunk Assessment   Upper Extremity Assessment: Generalized weakness           Lower Extremity Assessment: Generalized weakness      Cervical / Trunk Assessment: Kyphotic  Communication   Communication: No difficulties  Cognition Arousal/Alertness: Awake/alert Behavior During Therapy: WFL for tasks assessed/performed Overall Cognitive Status: Within Functional Limits for tasks assessed                      General Comments      Exercises        Assessment/Plan    PT Assessment Patient needs continued PT services  PT Diagnosis Difficulty walking   PT Problem List  Decreased strength;Decreased activity tolerance;Decreased mobility;Decreased knowledge of use of DME;Decreased range of motion;Decreased safety awareness;Decreased balance  PT Treatment Interventions DME instruction;Gait training;Functional mobility training;Therapeutic activities;Therapeutic exercise;Balance training;Patient/family education   PT Goals (Current goals can be found in the Care Plan section) Acute Rehab PT Goals Patient Stated Goal: Placed somewhere for long term care PT Goal Formulation: With patient Time For Goal Achievement: 07/23/14 Potential to Achieve Goals: Good    Frequency Min 2X/week   Barriers to discharge        Co-evaluation               End of Session Equipment Utilized During Treatment:  Gait belt Activity Tolerance: Patient tolerated treatment well;Patient limited by fatigue Patient left: in bed;with call bell/phone within reach Nurse Communication: Mobility status    Functional Assessment Tool Used: clinical judgement Functional Limitation: Mobility: Walking and moving around Mobility: Walking and Moving Around Current Status ([id number]): At least 20 percent but less than 40 percent impaired, limited or restricted Mobility: Walking and Moving Around Goal Status [phone]([id number]): At least 1 percent but less than 20 percent impaired, limited or restricted    Time: 1400-1427 PT Time Calculation (min): 27 min   Charges:   PT Evaluation $Initial PT Evaluation Tier I: 1 Procedure PT Treatments $Gait Training: 8-22 mins   PT G Codes:   Functional Assessment Tool Used: clinical judgement Functional Limitation: Mobility: Walking and moving around    [facility] Surgery Center LLCBRADSHAW,Ralph Rigg 07/09/2014, 4:48 PM

## 2014-07-09 NOTE — Progress Notes (Signed)
Pt did not attend wrap up group despite being encouraged to do so by staff.

## 2014-07-09 NOTE — [facility] Group Notes (Signed)
[facility] Group Notes:  (Nursing/MHT/Case Management/Adjunct)  Date:  07/09/2014  Time:  4:19 PM  Type of Therapy:  Psychoeducational Skills  Participation Level:  Did Not Attend  Participation Quality:  did not attend  Affect:  did not attend  Cognitive:  did not attend  Insight:  None  Engagement in Group:  did not attend  Modes of Intervention:  did not attend  Summary of Progress/Problems:pt did not attend in was in bed asleep.  Jule SerKent, Inga Noller Gail 07/09/2014, 4:19 PM

## 2014-07-09 NOTE — Progress Notes (Signed)
D. Pt has been in room and in bed for much of the evening. Pt was seen ambulating in room with walker to use the bathroom. Pt appears weak and did need some assistance in completing some tasks. Pt did eat and drink this evening with encouragement from staff. Pt did receive medications without incident. A. Support and encouragement provided. R. Safety maintained, will continue to monitor.

## 2014-07-09 NOTE — Progress Notes (Addendum)
[facility] Group Notes:  (Nursing/MHT/Case Management/Adjunct)  Date:  07/09/2014  Time:  3:48 PM  Type of Therapy:  Therapeutic Activity  Participation Level:  Did not attend   Darlen Gledhill C 07/09/2014, 3:48 PM 

## 2014-07-09 NOTE — [facility] Group Notes (Signed)
[facility]  07/09/2014 2:53 PM  Type of Therapy:  Group Therapy  Participation Level:  Did Not Attend   Beverly Sessionsywan J Lindsey MSW, LCSW  Clide DalesHarrill, Gee Habig Campbell 07/09/2014, 2:53 PM

## 2014-07-09 NOTE — Progress Notes (Signed)
Pt has been in bed all day.  [place] MHT assisted pt to bathe.  He remains incontinent at times and does wear a type of brief. He rated his depression 6 hopelessness 0 and his anxiety a 3 on his self-inventory.  He denied any S/H ideation or A/V/H. He has eaten at least 90% of each meal today.  He still refusing to drink the ensure but does take another feeding supplement Pro-Stat sugar free 64...30 ml twice a day.  It is placed in a cup of ginger ale which he drinks that entirety. PT did come today to evaluate the pt and recommended a different walker which they have left here [facility]. Plans will be made at some point if he needs to continue with a walker.

## 2014-07-09 NOTE — Progress Notes (Signed)
[facility] MD Progress Note  07/09/2014 6:15 PM ORVA GWALTNEY  MRN:  [id number] Subjective:  Treg states he is not feeling too good.  Admits to some back pain. Otherwise no other specific complains Diagnosis:   DSM5: Substance/Addictive Disorders:  Alcohol Related Disorder - Severe (303.90) Depressive Disorders:  Major Depressive Disorder - Moderate (296.22) Total Time spent with patient: 30 minutes  Axis I: Substance Induced Mood Disorder  ADL's:  Intact  Sleep: Poor  Appetite:  Fair   Psychiatric Specialty Exam: Physical Exam  Review of Systems  Constitutional: Positive for weight loss and malaise/fatigue.  HENT: Negative.   Eyes: Negative.   Respiratory: Negative.   Cardiovascular: Negative.   Gastrointestinal: Negative.   Genitourinary: Negative.   Musculoskeletal: Positive for back pain.  Skin: Negative.   Neurological: Positive for weakness.  Endo/Heme/Allergies: Negative.   Psychiatric/Behavioral: Positive for substance abuse. The patient is nervous/anxious and has insomnia.     Blood pressure 105/70, pulse 79, temperature 97.7 F (36.5 C), temperature source Oral, resp. rate 12, height '5\' 1"'  (1.549 m), weight 40.824 kg (90 lb), SpO2 10 %.Body mass index is 17.01 kg/(m^2).  General Appearance: Disheveled and fragile looking  Eye Contact::  Fair  Speech:  Clear and Coherent, Slow and not spontaneous  Volume:  Decreased  Mood:  Depressed  Affect:  Restricted  Thought Process:  Coherent and Goal Directed  Orientation:  Other:  person place  Thought Content:  no spontaneous content answer questions  Suicidal Thoughts:  No  Homicidal Thoughts:  No  Memory:  Immediate;   Fair Recent;   Poor Remote;   Fair  Judgement:  Fair  Insight:  Present and Shallow  Psychomotor Activity:  Decreased  Concentration:  Fair  Recall:  Poor  Fund of Knowledge:NA  Language: Fair  Akathisia:  No  Handed:    AIMS (if indicated):     Assets:  Desire for Improvement  Sleep:   Number of Hours: 2.75   Musculoskeletal: Strength & Muscle Tone: decreased Gait & Station: unsteady Patient leans: N/A  Current Medications: Current Facility-Administered Medications  Medication Dose Route Frequency Provider Last Rate Last Dose  . acamprosate (CAMPRAL) tablet 666 mg  666 mg Oral TID WC Jenne Campus, MD   666 mg at 07/09/14 1742  . acetaminophen (TYLENOL) tablet 650 mg  650 mg Oral Q6H PRN Laverle Hobby, PA-C      . alum & mag hydroxide-simeth (MAALOX/MYLANTA) 200-200-20 MG/5ML suspension 30 mL  30 mL Oral Q4H PRN Laverle Hobby, PA-C      . feeding supplement (ENSURE COMPLETE) (ENSURE COMPLETE) liquid 237 mL  237 mL Oral BID BM Clayton Bibles, RD   237 mL at 07/06/14 1400  . feeding supplement (PRO-STAT SUGAR FREE 64) liquid 30 mL  30 mL Oral BID Barton Dubois, MD   30 mL at 07/09/14 1742  . folic acid (FOLVITE) tablet 1 mg  1 mg Oral Daily Nishant Dhungel, MD   1 mg at 07/09/14 0909  . magnesium hydroxide (MILK OF MAGNESIA) suspension 30 mL  30 mL Oral Daily PRN Laverle Hobby, PA-C      . mirtazapine (REMERON) tablet 15 mg  15 mg Oral QHS Jenne Campus, MD   15 mg at 07/08/14 2348  . multivitamin with minerals tablet 1 tablet  1 tablet Oral Daily Laverle Hobby, PA-C   1 tablet at 07/09/14 0910  . thiamine (VITAMIN B-1) tablet 100 mg  100 mg Oral Daily  Laverle Hobby, PA-C   100 mg at 07/09/14 7829    Lab Results:  Results for orders placed or performed during the hospital encounter of 07/05/14 (from the past 48 hour(s))  Urinalysis with microscopic     Status: Abnormal   Collection Time: 07/08/14  1:43 AM  Result Value Ref Range   Color, Urine YELLOW YELLOW   APPearance CLEAR CLEAR   Specific Gravity, Urine 1.019 1.005 - 1.030   pH 6.0 5.0 - 8.0   Glucose, UA NEGATIVE NEGATIVE mg/dL   Hgb urine dipstick NEGATIVE NEGATIVE   Bilirubin Urine NEGATIVE NEGATIVE   Ketones, ur NEGATIVE NEGATIVE mg/dL   Protein, ur NEGATIVE NEGATIVE mg/dL   Urobilinogen, UA  1.0 0.0 - 1.0 mg/dL   Nitrite NEGATIVE NEGATIVE   Leukocytes, UA NEGATIVE NEGATIVE   Crystals CA OXALATE CRYSTALS (A) NEGATIVE    Comment: Performed at [place] metabolic panel     Status: Abnormal   Collection Time: 07/08/14  6:24 AM  Result Value Ref Range   Sodium 142 137 - 147 mEq/L   Potassium 3.9 3.7 - 5.3 mEq/L   Chloride 104 96 - 112 mEq/L   CO2 28 19 - 32 mEq/L   Glucose, Bld 83 70 - 99 mg/dL   BUN 18 6 - 23 mg/dL   Creatinine, Ser 0.96 0.50 - 1.35 mg/dL   Calcium 8.4 8.4 - 10.5 mg/dL   GFR calc non Af Amer 84 (L) >90 mL/min   GFR calc Af Amer >90 >90 mL/min    Comment: (NOTE) The eGFR has been calculated using the CKD EPI equation. This calculation has not been validated in all clinical situations. eGFR's persistently <90 mL/min signify possible Chronic Kidney Disease.    Anion gap 10 5 - 15    Comment: Performed at [facility]  CBC with Differential     Status: Abnormal   Collection Time: 07/08/14  6:24 AM  Result Value Ref Range   WBC 5.1 4.0 - 10.5 K/uL   RBC 3.89 (L) 4.22 - 5.81 MIL/uL   Hemoglobin 12.9 (L) 13.0 - 17.0 g/dL   HCT 38.0 (L) 39.0 - 52.0 %   MCV 97.7 78.0 - 100.0 fL   MCH 33.2 26.0 - 34.0 pg   MCHC 33.9 30.0 - 36.0 g/dL   RDW 17.1 (H) 11.5 - 15.5 %   Platelets 172 150 - 400 K/uL   Neutrophils Relative % 51 43 - 77 %   Neutro Abs 2.6 1.7 - 7.7 K/uL   Lymphocytes Relative 34 12 - 46 %   Lymphs Abs 1.7 0.7 - 4.0 K/uL   Monocytes Relative 14 (H) 3 - 12 %   Monocytes Absolute 0.7 0.1 - 1.0 K/uL   Eosinophils Relative 1 0 - 5 %   Eosinophils Absolute 0.1 0.0 - 0.7 K/uL   Basophils Relative 0 0 - 1 %   Basophils Absolute 0.0 0.0 - 0.1 K/uL    Comment: Performed at [facility]  Vitamin B12     Status: None   Collection Time: 07/08/14  6:24 AM  Result Value Ref Range   Vitamin B-12 473 211 - 911 pg/mL    Comment: Performed at [facility]  TSH     Status: Abnormal    Collection Time: 07/08/14  6:24 AM  Result Value Ref Range   TSH 5.780 (H) 0.350 - 4.500 uIU/mL    Comment: Performed at [facility]  Physical Findings: AIMS: Facial and Oral Movements Muscles of Facial Expression: None, normal Lips and Perioral Area: None, normal Jaw: None, normal Tongue: None, normal,Extremity Movements Upper (arms, wrists, hands, fingers): None, normal Lower (legs, knees, ankles, toes): None, normal, Trunk Movements Neck, shoulders, hips: None, normal, Overall Severity Severity of abnormal movements (highest score from questions above): None, normal Incapacitation due to abnormal movements: None, normal Patient's awareness of abnormal movements (rate only patient's report): No Awareness, Dental Status Current problems with teeth and/or dentures?: No Does patient usually wear dentures?: No  CIWA:  CIWA-Ar Total: 8 COWS:     Treatment Plan Summary: Daily contact with patient to assess and evaluate symptoms and progress in treatment Medication management  Plan: Supportive approach/coping skills/relapse prevention           Continue actual management  Medical Decision Making Problem Points:  Review of psycho-social stressors (1) Data Points:  Review of medication regiment & side effects (2)  I certify that inpatient services furnished can reasonably be expected to improve the patient's condition.   Mimie Goering A 07/09/2014, 6:15 PM

## 2014-07-09 NOTE — Progress Notes (Signed)
[facility] Group Notes:  (Nursing/MHT/Case Management/Adjunct)  Date:  07/09/2014  Time:  1:25 PM  Type of Therapy:  Therapeutic Activity  Participation Level:  Did not attend  Samreet Edenfield C 07/09/2014, 1:25 PM 

## 2014-07-09 NOTE — [facility] Group Notes (Signed)
0900 nursing orientation group   The focus of this group is to educate the patient on the purpose and policies of crisis stabilization and provide a format to answer questions about their admission.  The group details unit policies and expectations of patients while admitted.   Pt did not attend he was in bed asleep.  

## 2014-07-10 DIAGNOSIS — F10959 Alcohol use, unspecified with alcohol-induced psychotic disorder, unspecified: Secondary | ICD-10-CM

## 2014-07-10 DIAGNOSIS — F321 Major depressive disorder, single episode, moderate: Secondary | ICD-10-CM

## 2014-07-10 NOTE — [facility] Group Notes (Signed)
[facility] LCSW Group Therapy  07/10/2014 2:47 PM  Type of Therapy:  Group Therapy  Participation Level:  Minimal  Participation Quality:  Appropriate  Affect:  Flat  Cognitive:  Appropriate  Insight:  Developing/Improving, Engaged and Supportive  Engagement in Therapy:  Developing/Improving, Engaged and Supportive  Modes of Intervention:  Discussion, Education, Exploration, Rapport Building and Support  Summary of Progress/Problems: Pt was able to participate in group and was supportive of discussion, however did not share any examples of self-sabotaging behaviors.   Seabron SpatesVaughn, Ralph Jackson 07/10/2014, 2:47 PM

## 2014-07-10 NOTE — [facility] Group Notes (Signed)
[facility] Group Notes:  Life skills  Date:  07/10/2014  Time:  12:17 PM  Type of Therapy:  Nurse Education  Participation Level:  Active  Participation Quality:  Appropriate  Affect:  Appropriate  Cognitive:  Appropriate  Insight:  Appropriate  Engagement in Group:  Engaged  Modes of Intervention:  Education  Summary of Progress/Problems:Pt stated he wants to develop positive coping skills and learn how to be unselfish   Nicole CellaWebb, Ralph Jackson 07/10/2014, 12:17 PM

## 2014-07-10 NOTE — Progress Notes (Signed)
Patient did not attend the evening speaker AA meeting. Pt was notified that group was beginning but remained in bed.   

## 2014-07-10 NOTE — Progress Notes (Addendum)
Pt was excited that he felt like going to breakfast this am. He utilizes the walker given to him and is steady on his feet when using this. He stated,"if I go back to where i lived I will be back here." pt has been taking nutrition supplements. He rates his depression a 3/10 and anxiety a 2/10 today. Pt is cooperative and compliant taking his medications while here. He is very pleasant and participates in group. He denies SI and HI and contracts for safety-1pm-Pt was incontinent of stool and needed assistance cleaning up. His pants were washed and he was given a new pair of pants to wear. Pt was instructed to let the nurse know whenever he needs any help. He was reassured to ask for help.

## 2014-07-10 NOTE — Progress Notes (Signed)
D. Pt has been in room much of the evening, very minimal interaction. Pt spoke about how he is still feeling week and not feeling well and spoke about needing to get rest. Pt does appear depressed, however is unable to participate in group activities this evening. Pt has been provided with fluids and is freely drinking what is given to him. Pt also seen using walker to ambulate in room to use bathroom. Pt also received medications without incident. A. Support and encouragement provided. R. Safety maintained, will continue to monitor.

## 2014-07-10 NOTE — Progress Notes (Signed)
Adult Psychoeducational Group Note  Date:  07/10/2014 Time:  4:50PM  Group Topic/Focus: Therapeutic Activity    Participation Level:  Active  Participation Quality:  Appropriate  Affect:  Appropriate  Cognitive:  Appropriate  Insight: Appropriate  Engagement in Group:  Engaged  Modes of Intervention:  Activity  Additional Comments:  Pt attended group this afternoon. Pt participate in therapeutic ball activity group with peers. Pt was pleasant and appropriate in group.

## 2014-07-10 NOTE — [facility] Group Notes (Signed)
[facility] Group Notes: relaxation  Date:  07/10/2014  Time:  10:24 AM  Type of Therapy:  Nurse Education  Participation Level:  Active  Participation Quality:  Appropriate  Affect:  Appropriate  Cognitive:  Alert  Insight:  Appropriate  Engagement in Group:  Engaged  Modes of Intervention:  Education  Summary of Progress/Problems:pt stated his three pillars are all the higher powers.He stated he has learned that if he goes back into the same environment he will be back here.   Rodman KeyWebb, Myrtice Lowdermilk [facility] Center[facility]es 07/10/2014, 10:24 AM

## 2014-07-10 NOTE — Progress Notes (Signed)
Patient ID: Ralph Jackson, male   DOB: 07/26/1946, 68 y.o.   MRN: [id number] [facility]  07/10/2014 10:25 AM Ralph MolaDouglas W Mode  MRN:  [id number] Subjective:  Ralph Jackson states he is feeling little better.  Admits to some back pain. Otherwise no other specific complains.  States "if I were to go back to where I was living, I'll never make it" Objective:  Patient was observed walking around unit with his rolling walker.  He appears brighter and better groomed.  He was also observed to be in group room and eating snacks with rest of patients. Diagnosis:   DSM5: Substance/Addictive Disorders:  Alcohol Related Disorder - Severe (303.90) Depressive Disorders:  Major Depressive Disorder - Moderate (296.22) Total Time spent with patient: 30 minutes  Axis I: Substance Induced Mood Disorder  ADL's:  Intact  Sleep: Poor  Appetite:  Fair   Psychiatric Specialty Exam: Physical Exam  Psychiatric: He has a normal mood and affect. His speech is normal and behavior is normal. Thought content normal. Cognition and memory are normal.    Review of Systems  Constitutional: Positive for weight loss and malaise/fatigue.  HENT: Negative.   Eyes: Negative.   Respiratory: Negative.   Cardiovascular: Negative.   Gastrointestinal: Negative.   Genitourinary: Negative.   Musculoskeletal: Positive for back pain.  Skin: Negative.   Neurological: Positive for weakness.  Endo/Heme/Allergies: Negative.   Psychiatric/Behavioral: Positive for substance abuse. The patient is nervous/anxious and has insomnia.     Blood pressure 139/91, pulse 89, temperature 97.6 F (36.4 C), temperature source Oral, resp. rate 18, height 5\' 1"  (1.549 m), weight 40.824 kg (90 lb), SpO2 10 %.Body mass index is 17.01 kg/(m^2).  General Appearance: Disheveled and fragile looking  Eye Contact::  Fair  Speech:  Clear and Coherent, Slow and not spontaneous  Volume:  Decreased  Mood:  Depressed  Affect:  Restricted   Thought Process:  Coherent and Goal Directed  Orientation:  Other:  person place  Thought Content:  no spontaneous content answer questions  Suicidal Thoughts:  No  Homicidal Thoughts:  No  Memory:  Immediate;   Fair Recent;   Poor Remote;   Fair  Judgement:  Fair  Insight:  Present and Shallow  Psychomotor Activity:  Decreased  Concentration:  Fair  Recall:  Poor  Fund of Knowledge:NA  Language: Fair  Akathisia:  No  Handed:    AIMS (if indicated):     Assets:  Desire for Improvement  Sleep:  Number of Hours: 5.25   Musculoskeletal: Strength & Muscle Tone: decreased Gait & Station: unsteady, uses rolling walker Patient leans: N/A  Current Medications: Current Facility-Administered Medications  Medication Dose Route Frequency Provider Last Rate Last Dose  . acamprosate (CAMPRAL) tablet 666 mg  666 mg Oral TID WC Craige CottaFernando A Cobos, MD   666 mg at 07/10/14 0838  . acetaminophen (TYLENOL) tablet 650 mg  650 mg Oral Q6H PRN Kerry HoughSpencer E Simon, PA-C      . alum & mag hydroxide-simeth (MAALOX/MYLANTA) 200-200-20 MG/5ML suspension 30 mL  30 mL Oral Q4H PRN Kerry HoughSpencer E Simon, PA-C      . feeding supplement (ENSURE COMPLETE) (ENSURE COMPLETE) liquid 237 mL  237 mL Oral BID BM Tilda FrancoLindsey Baker, RD   237 mL at 07/10/14 0835  . feeding supplement (PRO-STAT SUGAR FREE 64) liquid 30 mL  30 mL Oral BID Vassie Lollarlos Madera, MD   30 mL at 07/10/14 0835  . folic acid (FOLVITE) tablet 1 mg  1 mg Oral Daily Nishant Dhungel, MD   1 mg at 07/10/14 0839  . magnesium hydroxide (MILK OF MAGNESIA) suspension 30 mL  30 mL Oral Daily PRN Kerry HoughSpencer E Simon, PA-C      . mirtazapine (REMERON) tablet 15 mg  15 mg Oral QHS Craige CottaFernando A Cobos, MD   15 mg at 07/09/14 2224  . multivitamin with minerals tablet 1 tablet  1 tablet Oral Daily Kerry HoughSpencer E Simon, PA-C   1 tablet at 07/10/14 [id number]  . thiamine (VITAMIN B-1) tablet 100 mg  100 mg Oral Daily Kerry HoughSpencer E Simon, PA-C   100 mg at 07/10/14 [id number]    Lab Results:  No results found  for this or any previous visit (from the past 48 hour(s)).  Physical Findings: AIMS: Facial and Oral Movements Muscles of Facial Expression: None, normal Lips and Perioral Area: None, normal Jaw: None, normal Tongue: None, normal,Extremity Movements Upper (arms, wrists, hands, fingers): None, normal Lower (legs, knees, ankles, toes): None, normal, Trunk Movements Neck, shoulders, hips: None, normal, Overall Severity Severity of abnormal movements (highest score from questions above): None, normal Incapacitation due to abnormal movements: None, normal Patient's awareness of abnormal movements (rate only patient's report): No Awareness, Dental Status Current problems with teeth and/or dentures?: No Does patient usually wear dentures?: No  CIWA:  CIWA-Ar Total: 8 COWS:     Treatment Plan Summary: Daily contact with patient to assess and evaluate symptoms and progress in treatment Medication management   Plan: Supportive approach/coping skills/relapse prevention           Continue actual management           Patient needs discharge help to possibly go to a SNF.    Medical Decision Making Problem Points:  Review of psycho-social stressors (1) Data Points:  Review of medication regiment & side effects (2)  I certify that inpatient services furnished can reasonably be expected to improve the patient's condition.   Clairissa Valvano MAY, AGNP-BC 07/10/2014, 10:25 AM

## 2014-07-11 DIAGNOSIS — F1919 Other psychoactive substance abuse with unspecified psychoactive substance-induced disorder: Secondary | ICD-10-CM

## 2014-07-11 LAB — T3: T3, Total: 107.7 ng/dl (ref 80.0–204.0)

## 2014-07-11 LAB — T4, FREE: FREE T4: 0.85 ng/dL (ref 0.80–1.80)

## 2014-07-11 MED ORDER — ACAMPROSATE CALCIUM 333 MG PO TBEC: 666.0000 mg | DELAYED_RELEASE_TABLET | Freq: Three times a day (TID) | ORAL | Status: DC

## 2014-07-11 MED ORDER — MIRTAZAPINE 15 MG PO TABS: 15.0000 mg | ORAL_TABLET | Freq: Every day | ORAL | Status: DC

## 2014-07-11 NOTE — [facility] Group Notes (Signed)
   [facility]  07/11/2014  8:45 AM   Participation Quality: Alert, Appropriate and Oriented  Mood/Affect: Appropriate  Depression Rating: 2  Anxiety Rating: 5  Thoughts of Suicide: Pt denies SI/HI  Will you contract for safety? Yes  Current AVH: Pt denies  Plan for Discharge/Comments: Pt attended discharge planning group and actively participated in group. CSW provided pt with today's workbook. Patient reports that he is feeling "good" today. Patient reports that he would like long term placement at a SNF/ALF. Patient agreeable to returning home with home health through [facility] RN, PT, and SW to assist with any placement needs/medicaid application.  Transportation Means: Pt reports access to transportation- requests bus pass  Supports: No supports mentioned at this time  Samuella BruinKristin Lorrin Nawrot, MSW, [employer]CSWA Clinical Social Worker [facility] [phone]

## 2014-07-11 NOTE — Progress Notes (Signed)
D: Patient is alert and oriented. Pt's mood is improving and affect is WDL. Pt states his depression and anxiety 0/10 and hopelessness 2/10. Pt reports he is ready to go home today. Pt denies SI/HI and AVH. A: Medications administered per providers orders (See MAR). 15 minute checks completed per protocol for pt safety. R: Pt cooperative and receptive to nursing interventions.

## 2014-07-11 NOTE — Progress Notes (Signed)
DISCHARGE NOTE: D: Patient is alert and oriented and in stable condition upon discharge. Pt was ambulatory with a front wheel walker at discharge. Pt denies SI/HI and AVH. A: AVS reviewed and a copy was given to pt. Follow up reviewed. Medications/Prescriptions given. Belongings returned to pt. Pt was given time to ask questions and express concerns. Resources discussed. R: Pt D/C'd to [employer] cab [employer] with voucher.

## 2014-07-11 NOTE — Progress Notes (Signed)
D. Pt has been in room and in bed for much of the evening, has chosen not to attend or participate in various milieu activities this evening. Pt only verbalized that he had been up all day and was tired and was ready to lay down for the evening, pt did not verbalize any complaints and does not appear to be in any acute distress. Pt received medication this evening without incident. A. Support and encouragement provided. R. Safety maintained, will continue to monitor.

## 2014-07-11 NOTE — [facility] Suicide Risk Assessment (Signed)
Demographic Factors:  68 year old man, divorces, two adult children, [address]  Total Time spent with patient: 30 minutes  Psychiatric Specialty Exam: Physical Exam  ROS  Blood pressure 144/99, pulse 87, temperature 97.6 F (36.4 C), temperature source Oral, resp. rate 24, height 5\' 1"  (1.549 m), weight 40.824 kg (90 lb), SpO2 10 %.Body mass index is 17.01 kg/(m^2).  General Appearance: improved grooming   Eye Contact::  Good  Speech:  clearer speech  Volume:  Normal  Mood:  better and today describes mood as "OK", denies depression  Affect:  Appropriate and reactive  Thought Process:  Linear  Orientation:  Other:  fully alert and attentive, does not present confused at this time, he is oriented to time, place , person  Thought Content:  denies hallucinations,no delusions are expressed, no delusions expressed  Suicidal Thoughts:  No- patient denies any suicidal or homicidal ideations  Homicidal Thoughts:  No  Memory:  recent and remote improving  Judgement:  Other:  improving   Insight:  improving  Psychomotor Activity:  improved, more mobile, steadier gait  Concentration:  Good  Recall:  Good  Fund of Knowledge:Good  Language: Good  Akathisia:  NA  Handed:  Right  AIMS (if indicated):     Assets:  Desire for Improvement Resilience  Sleep:  Number of Hours: 6    Musculoskeletal: Strength & Muscle Tone: within normal limits Gait & Station: gait is improving- walks with walker, without difficulty and much better and more mobile compared to his admission status Patient leans: N/A   Mental Status Per Nursing Assessment::   On Admission:  NA  Current Mental Status by Physician: At this time patient is improved, mood is improved, affect is more reactive, he denies depression, he is not suicidal or homicidal or psychotic and is future oriented. He is not delirious and does not appear confused. Gait is steadier and improved. He is future oriented. At this  time he is not presenting with any alcohol withdrawal symptoms.  Loss Factors: Limited support system, deteriorating health, unemployed   Historical Factors: Long history of alcohol dependence   Risk Reduction Factors:   Positive coping skills or problem solving skills  Continued Clinical Symptoms:  As noted, at this time patient seems much improved, with fuller range of affect and euthymic mood, and no SI or HI. He is not presenting with any active or ongoing alcohol withdrawal symptoms and is future oriented.   Cognitive Features That Contribute To Risk:  Improved- presenting with more insight regarding chronic illness- addictive illness. States " I know that if I continue to drink I may die" and states he is motivated in making ongoing efforts to abstain.  Suicide Risk:  Mild:  Suicidal ideation of limited frequency, intensity, duration, and specificity.  There are no identifiable plans, no associated intent, mild dysphoria and related symptoms, good self-control (both objective and subjective assessment), few other risk factors, and identifiable protective factors, including available and accessible social support.  Discharge Diagnoses:   AXIS I:  Alcohol Dependence, S/P Alcohol Withdrawal, Alcohol Induced Mood Disorder, depressed AXIS II:  Deferred AXIS III:   Past Medical History  Diagnosis Date  . Hypertension   . Urinary tract infection   . ETOH abuse    AXIS IV:  housing problems, occupational problems and problems related to social environment AXIS V:  51-60 moderate symptoms  Plan Of Care/Follow-up recommendations:  Activity:  As tolerated Diet:  Low sodium Tests:  NA Other:  See below  Is patient on multiple antipsychotic therapies at discharge:  No   Has Patient had three or more failed trials of antipsychotic monotherapy by history:  No  Recommended Plan for Multiple Antipsychotic Therapies: NA  Patient is leaving unit in good spirits. [facility]  [facility]    Specialty: Behavioral Health   Why: Please present [facility]-Friday between 8am-3pm for assessment for therapy and medication management services.   Contact information:   [address]    He is also being referred to Home Health Visits by RN and SW. Patient states he may consider possible admission to a Nursing Home in the near future, if his outpatient treatment team feels it is a good idea. [facility] referred [facility] for ongoing medical management as needed  Candence Sease, Madaline GuthrieFERNANDO 07/11/2014, 1:00 PM

## 2014-07-11 NOTE — Progress Notes (Signed)
[facility] :  Will you be returning to the same living situation after discharge: Yes,  patient to return home At discharge, do you have transportation home?:Yes,  patient will be provided with bus pass Do you have the ability to pay for your medications:Yes,  patient will be provided with medication samples and prescriptions at discharge  Release of information consent forms completed and in the chart;  Patient's signature needed at discharge.  Patient to Follow up at: Follow-up Information    [facility]   Specialty:  Behavioral Health   Why:  Please present to [facility]-Friday between 8am-3pm for assessment for therapy and medication management services.   Contact information:   [address] [place] [address] [phone]       Patient denies SI/HI:   Yes,  denies    Safety Planning and Suicide Prevention discussed:  Yes,  with patient  Jaquita Bessire, [place] CarboKristin L 07/11/2014, 12:30 PM

## 2014-07-11 NOTE — Clinical Social Work Note (Signed)
Walker supplied to patient from [facility]. Patient provided a taxi voucher due to being a high fall risk. Patient agreeable to home health referral to [facility] for PT, RN, and SW. Per patient request, CSW initiated a referral to mobile meals.  Samuella BruinKristin Alford Gamero, MSW, [employer]CSWA Clinical Social Worker [facility] [phone]

## 2014-07-13 NOTE — Discharge Summary (Signed)
Physician Discharge Summary Note  Patient:  Ralph Jackson is an 68 y.o., male MRN:  [id number] DOB:  07/17/1946 Patient phone:  [phone] (home)  Patient address:   [address] [place] [address],  Total Time spent with patient: 30 minutes  Date of Admission:  07/05/2014 Date of Discharge: 07/11/2014  Reason for Admission:  ETOH dependence  Discharge Diagnoses:  Active Problems:   Alcohol abuse   Alcohol dependence with alcohol-induced mood disorder   Protein-calorie malnutrition, severe   TSH elevation   FTT (failure to thrive) in adult   Psychiatric Specialty Exam: Physical Exam  Vitals reviewed. Psychiatric: He has a normal mood and affect. His speech is normal and behavior is normal. Judgment and thought content normal. Cognition and memory are normal.    Review of Systems  HENT: Negative.   Eyes: Negative.   Respiratory: Negative.   Cardiovascular: Negative.   Gastrointestinal: Negative.   Genitourinary: Negative.   Musculoskeletal: Positive for falls (using rolling walker).  Skin: Negative.   Neurological: Positive for weakness.  Endo/Heme/Allergies: Negative.   Psychiatric/Behavioral: Positive for depression and substance abuse. Negative for suicidal ideas, hallucinations and memory loss. The patient is not nervous/anxious and does not have insomnia.     Blood pressure 144/99, pulse 87, temperature 97.6 F (36.4 C), temperature source Oral, resp. rate 24, height 5\' 1"  (1.549 m), weight 40.824 kg (90 lb), SpO2 10 %.Body mass index is 17.01 kg/(m^2).   Past Psychiatric History: Diagnosis: Alcohol Dependence  Hospitalizations: Patient states he has had prior admissions for detoxification  Outpatient Care:Denies any recent outpatient psychiatric treatment  Substance Abuse Care: has been in detox facilities in the past  Self-Mutilation: Denies   Suicidal Attempts: Denies   Violent Behaviors: Denies     Musculoskeletal: Strength & Muscle  Tone: within normal limits Gait & Station: normal Patient leans: N/A  DSM5:  Schizophrenia Disorders:  NA Obsessive-Compulsive Disorders:  NA Trauma-Stressor Disorders:  NA Substance/Addictive Disorders:  Alcohol Related Disorder - Severe (303.90) Depressive Disorders:  NA  Axis Diagnosis:   AXIS I:  Substance Abuse AXIS II:  Deferred AXIS III:   Past Medical History  Diagnosis Date  . Hypertension   . Urinary tract infection   . ETOH abuse    AXIS IV:  educational problems, housing problems and other psychosocial or environmental problems AXIS V:  61-70 mild symptoms  Level of Care:  OP  Hospital Course:  Patient is a 68 year old man. He has a long history of alcohol dependence. He has been drinking heavily and daily, up to a large bottle of wine every day. He last drank yesterday, prior to coming to hospital.  He states " I am getting weak and I need to go to a long term place" so he decided to come to ED. As per chart notes, patient had fallen prior to his admission, suffering laceration on chin. Patient does not remember this event. Patient had cervical and Head CT done in ED - no acute abnormalities.  He endorsed some Mild depression, which he attributes to alcohol, but he states that for the most part he does not feel depressed or sad.  Pertinent lab values, BAL 297 upon admission.   Patient did exhibit weakness during his inpatient stay.  His appetite was poor despite encouraging of solid and po intake per nursing.  Na and K+ levels were within normal limits.  Rest of blood work sent was unremarkable except for TSH of 5.78.  Hospitalist requested  to consult.  Discussed with consult and with CSW that SNF may be a reasonable option for patient after discharge.  CSW facilitated home health nursing, PT and case management to assist.  He tolerated medication and no adverse side effects reported.    On day of discharge, he denied anxiety and showed no visible signs and symptoms of  withdrawal.  He understood discharge instructions.  [facility]  He was advised to be adherent to outpatient treatment and to follow up with his PCP to re-assess his current health care status.   Consults:  psychiatry, hospitalist  Significant Diagnostic Studies:  labs: per ED  Discharge Vitals:   Blood pressure 144/99, pulse 87, temperature 97.6 F (36.4 C), temperature source Oral, resp. rate 24, height 5\' 1"  (1.549 m), weight 40.824 kg (90 lb), SpO2 10 %. Body mass index is 17.01 kg/(m^2). Lab Results:   No results found for this or any previous visit (from the past 72 hour(s)).  Physical Findings: AIMS: Facial and Oral Movements Muscles of Facial Expression: None, normal Lips and Perioral Area: None, normal Jaw: None, normal Tongue: None, normal,Extremity Movements Upper (arms, wrists, hands, fingers): None, normal Lower (legs, knees, ankles, toes): None, normal, Trunk Movements Neck, shoulders, hips: None, normal, Overall Severity Severity of abnormal movements (highest score from questions above): None, normal Incapacitation due to abnormal movements: None, normal Patient's awareness of abnormal movements (rate only patient's report): No Awareness, Dental Status Current problems with teeth and/or dentures?: No Does patient usually wear dentures?: No  CIWA:  CIWA-Ar Total: 8 COWS:     Psychiatric Specialty Exam: See Psychiatric Specialty Exam and Suicide Risk Assessment completed by Attending Physician prior to discharge.  Discharge destination:  Home  Is patient on multiple antipsychotic therapies at discharge:  No   Has Patient had three or more failed trials of antipsychotic monotherapy by history:  No  Recommended Plan for Multiple Antipsychotic Therapies: NA     Medication List    TAKE these medications      Indication   acamprosate 333 MG tablet  Commonly known as:  CAMPRAL  Take 2 tablets (666 mg total) by mouth 3 (three)  times daily with meals.   Indication:  Excessive Use of Alcohol     lisinopril 10 MG tablet  Commonly known as:  PRINIVIL,ZESTRIL  Take 1 tablet (10 mg total) by mouth daily. For high blood pressure   Indication:  High Blood Pressure     mirtazapine 15 MG tablet  Commonly known as:  REMERON  Take 1 tablet (15 mg total) by mouth at bedtime.   Indication:  Trouble Sleeping           Follow-up Information    [facility]   Specialty:  Behavioral Health   Why:  [facility] between 8am-3pm for assessment for therapy and medication management services.   Contact informationElpidio Eric:   [address] [place] [address] [phone]       Follow-up recommendations:  Activity:  as tolerated, diet as tol  Comments:  1.  Take all your medications as prescribed.              2.  Report any adverse side effects to outpatient provider.                       3.  Patient instructed to not use alcohol or illegal  drugs while on prescription medicines.            4.  In the event of worsening symptoms, instructed patient to call 911, the crisis hotline or go to nearest emergency room for evaluation of symptoms.  Total Discharge Time:  Greater than 30 minutes.  SignedAdonis Brook MAY, AGNP-BC 07/13/2014, 3:09 PM   Patient seen, Suicide Assessment Completed.  Disposition Plan Reviewed

## 2014-07-15 NOTE — Progress Notes (Signed)
Patient Discharge Instructions:  No documentation was [facility]ed [facility].  No ROI is available.  Jerelene ReddenSheena E Loma, 07/15/2014, 3:10 PM

## 2015-03-29 ENCOUNTER — Encounter: Payer: Self-pay | Admitting: Podiatry

## 2015-03-29 ENCOUNTER — Ambulatory Visit (INDEPENDENT_AMBULATORY_CARE_PROVIDER_SITE_OTHER): Payer: Medicare Other | Admitting: Podiatry

## 2015-03-29 VITALS — BP 134/83 | HR 9 | Resp 18

## 2015-03-29 DIAGNOSIS — B351 Tinea unguium: Secondary | ICD-10-CM

## 2015-03-29 DIAGNOSIS — M79676 Pain in unspecified toe(s): Secondary | ICD-10-CM | POA: Diagnosis not present

## 2015-03-29 NOTE — Progress Notes (Signed)
   Subjective:    Patient ID: Ralph Jackson, male    DOB: 1946/03/12, 69 y.o.   MRN: [id number]  HPI I AM HERE TO GET MY TOENAILS TRIMMED AND THEY ARE DISCOLORED AND THICK AND LONG AND NO DRAINING AND I DID HAVE CELLULITIS ON MY LEFT LEG AND THEY GAVE ME AN ANTIBIOTIC AND THAT WAS THE BEGINNING OF JULY This patient presents to the office with thick painful nails.  He says the nails are painful walking and wearing his shoes.  He is unable to self treat.  Review of Systems  Skin:       CHANGE IN NAILS  All other systems reviewed and are negative.      Objective:   Physical Exam GENERAL APPEARANCE: Alert, conversant. Appropriately groomed. No acute distress.  VASCULAR: Pedal pulses palpable at 2/4 DP and PT bilateral.  Capillary refill time is immediate to all digits,  Proximal to distal cooling it warm to warm.  Digital hair growth is present bilateral  NEUROLOGIC: sensation is intact epicritically and protectively to 5.07 monofilament at 5/5 sites bilateral.  Light touch is intact bilateral, vibratory sensation intact bilateral, achilles tendon reflex is intact bilateral.  MUSCULOSKELETAL: acceptable muscle strength, tone and stability bilateral.  Intrinsic muscluature intact bilateral.  Rectus appearance of foot and digits noted bilateral.   DERMATOLOGIC: skin color, texture, and turgor are within normal limits.  No preulcerative lesions or ulcers  are seen, no interdigital maceration noted.  No open lesions present.  . No drainage noted.  Nails  Thick disfigured discolored onychomycotic nails both feet.         Assessment & Plan:  Onychomycosis  Debridement and Grinding of Onychomycosis

## 2015-07-03 ENCOUNTER — Encounter: Payer: Medicare Other | Admitting: Podiatry

## 2015-07-03 ENCOUNTER — Encounter: Payer: Self-pay | Admitting: Podiatry

## 2015-07-05 NOTE — Progress Notes (Signed)
This encounter was created in error - please disregard.

## 2015-12-10 ENCOUNTER — Emergency Department (HOSPITAL_COMMUNITY)
Admission: EM | Admit: 2015-12-10 | Discharge: 2015-12-11 | Disposition: A | Discharge status: Home / Self Care | Payer: Medicare Other | Attending: Emergency Medicine | Admitting: Emergency Medicine

## 2015-12-10 ENCOUNTER — Emergency Department (HOSPITAL_COMMUNITY): Payer: Medicare Other

## 2015-12-10 ENCOUNTER — Encounter (HOSPITAL_COMMUNITY): Payer: Self-pay | Admitting: Emergency Medicine

## 2015-12-10 DIAGNOSIS — S51001A Unspecified open wound of right elbow, initial encounter: Secondary | ICD-10-CM | POA: Diagnosis not present

## 2015-12-10 DIAGNOSIS — I10 Essential (primary) hypertension: Secondary | ICD-10-CM | POA: Diagnosis not present

## 2015-12-10 DIAGNOSIS — W19XXXA Unspecified fall, initial encounter: Secondary | ICD-10-CM

## 2015-12-10 DIAGNOSIS — F10129 Alcohol abuse with intoxication, unspecified: Secondary | ICD-10-CM | POA: Diagnosis not present

## 2015-12-10 DIAGNOSIS — Y9289 Other specified places as the place of occurrence of the external cause: Secondary | ICD-10-CM | POA: Diagnosis not present

## 2015-12-10 DIAGNOSIS — W01198A Fall on same level from slipping, tripping and stumbling with subsequent striking against other object, initial encounter: Secondary | ICD-10-CM | POA: Diagnosis not present

## 2015-12-10 DIAGNOSIS — F1010 Alcohol abuse, uncomplicated: Secondary | ICD-10-CM

## 2015-12-10 DIAGNOSIS — Z87440 Personal history of urinary (tract) infections: Secondary | ICD-10-CM | POA: Diagnosis not present

## 2015-12-10 DIAGNOSIS — S61502A Unspecified open wound of left wrist, initial encounter: Secondary | ICD-10-CM | POA: Diagnosis not present

## 2015-12-10 DIAGNOSIS — Y9301 Activity, walking, marching and hiking: Secondary | ICD-10-CM | POA: Diagnosis not present

## 2015-12-10 DIAGNOSIS — Y998 Other external cause status: Secondary | ICD-10-CM | POA: Diagnosis not present

## 2015-12-10 DIAGNOSIS — Z79899 Other long term (current) drug therapy: Secondary | ICD-10-CM | POA: Diagnosis not present

## 2015-12-10 DIAGNOSIS — Z043 Encounter for examination and observation following other accident: Secondary | ICD-10-CM | POA: Diagnosis present

## 2015-12-10 DIAGNOSIS — R55 Syncope and collapse: Secondary | ICD-10-CM | POA: Diagnosis not present

## 2015-12-10 LAB — RAPID URINE DRUG SCREEN, HOSP PERFORMED
Amphetamines: NOT DETECTED
Barbiturates: NOT DETECTED
Benzodiazepines: NOT DETECTED
Cocaine: NOT DETECTED
OPIATES: NOT DETECTED
TETRAHYDROCANNABINOL: NOT DETECTED

## 2015-12-10 LAB — CBC WITH DIFFERENTIAL/PLATELET
Basophils Absolute: 0 10*3/uL (ref 0.0–0.1)
Basophils Relative: 0 %
Eosinophils Absolute: 0 10*3/uL (ref 0.0–0.7)
Eosinophils Relative: 0 %
HCT: 34.2 % — ABNORMAL LOW (ref 39.0–52.0)
HEMOGLOBIN: 11.6 g/dL — AB (ref 13.0–17.0)
LYMPHS ABS: 2.9 10*3/uL (ref 0.7–4.0)
LYMPHS PCT: 30 %
MCH: 30.5 pg (ref 26.0–34.0)
MCHC: 33.9 g/dL (ref 30.0–36.0)
MCV: 90 fL (ref 78.0–100.0)
Monocytes Absolute: 0.7 10*3/uL (ref 0.1–1.0)
Monocytes Relative: 7 %
NEUTROS PCT: 63 %
Neutro Abs: 6 10*3/uL (ref 1.7–7.7)
Platelets: 356 10*3/uL (ref 150–400)
RBC: 3.8 MIL/uL — AB (ref 4.22–5.81)
RDW: 14.1 % (ref 11.5–15.5)
WBC: 9.7 10*3/uL (ref 4.0–10.5)

## 2015-12-10 LAB — COMPREHENSIVE METABOLIC PANEL
ALK PHOS: 65 U/L (ref 38–126)
ALT: 14 U/L — AB (ref 17–63)
AST: 20 U/L (ref 15–41)
Albumin: 3.7 g/dL (ref 3.5–5.0)
Anion gap: 12 (ref 5–15)
BUN: 10 mg/dL (ref 6–20)
CALCIUM: 9.2 mg/dL (ref 8.9–10.3)
CO2: 25 mmol/L (ref 22–32)
CREATININE: 0.86 mg/dL (ref 0.61–1.24)
Chloride: 106 mmol/L (ref 101–111)
Glucose, Bld: 74 mg/dL (ref 65–99)
Potassium: 3.7 mmol/L (ref 3.5–5.1)
SODIUM: 143 mmol/L (ref 135–145)
Total Bilirubin: 0.3 mg/dL (ref 0.3–1.2)
Total Protein: 6.6 g/dL (ref 6.5–8.1)

## 2015-12-10 LAB — ETHANOL: Alcohol, Ethyl (B): 223 mg/dL — ABNORMAL HIGH (ref <5)

## 2015-12-10 IMAGING — CT CT CERVICAL SPINE W/O CM
4 of 6 series · 14 of 33 positions shown, 16 images · non-contrast
Comparison: [DATE]

CLINICAL DATA: Witnessed fall. Intoxicated. Trauma to the head and
neck.

EXAM:
CT HEAD WITHOUT CONTRAST
CT CERVICAL SPINE WITHOUT CONTRAST
TECHNIQUE: Multidetector CT imaging of the head and cervical spine was
performed following the standard protocol without intravenous
contrast. Multiplanar CT image reconstructions of the cervical spine
were also generated.

[Series 4: c-spine st · axial · 0.25mm/px · z∈[-210,-132]mm · 3 of 79 slices shown, 4 images]
[im 20/79  soft-tissue]
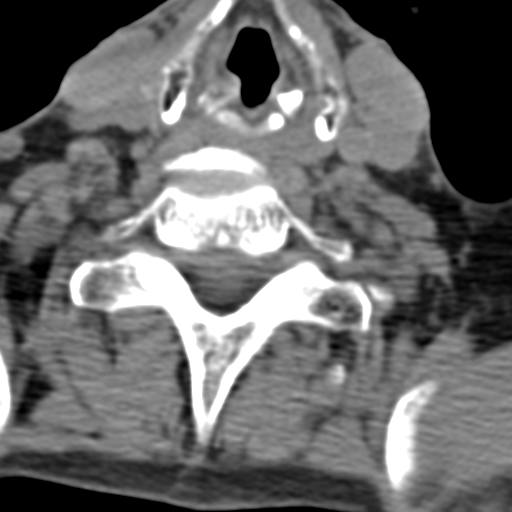
[im 20/79  bone]
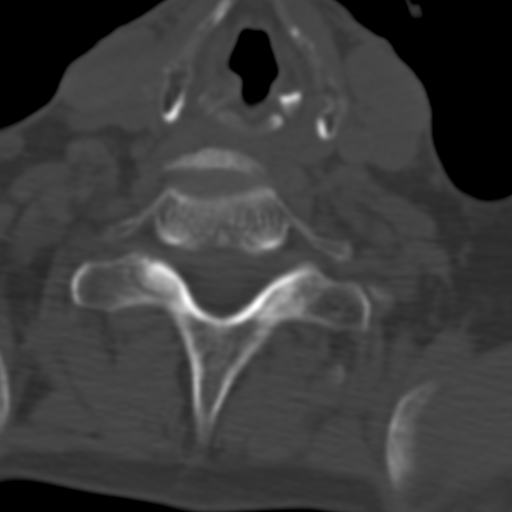
[im 40/79  bone]
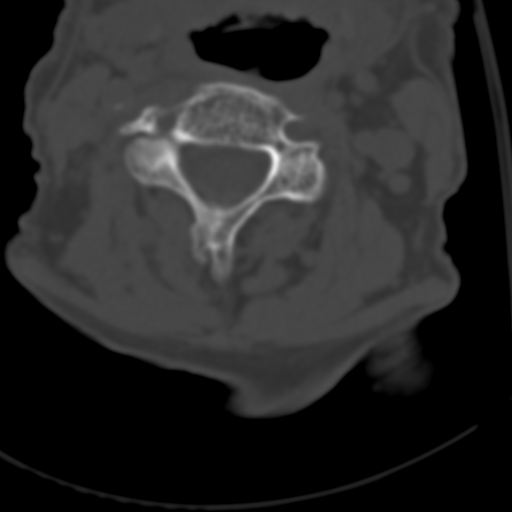
[im 59/79  bone]
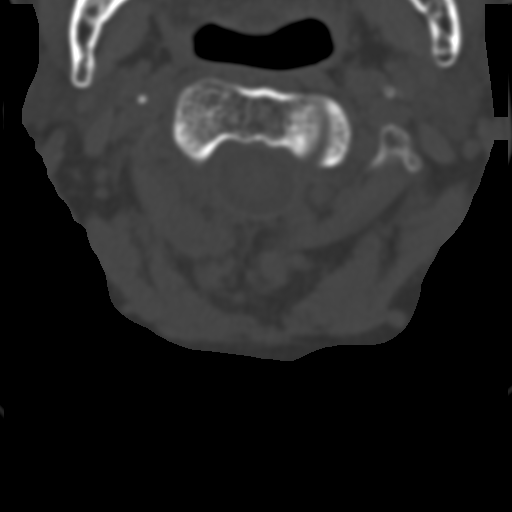

[Series 7: axial recon · axial · 0.23mm/px · z∈[-234,-160]mm · 3 of 82 slices shown]
[im 21/82  bone]
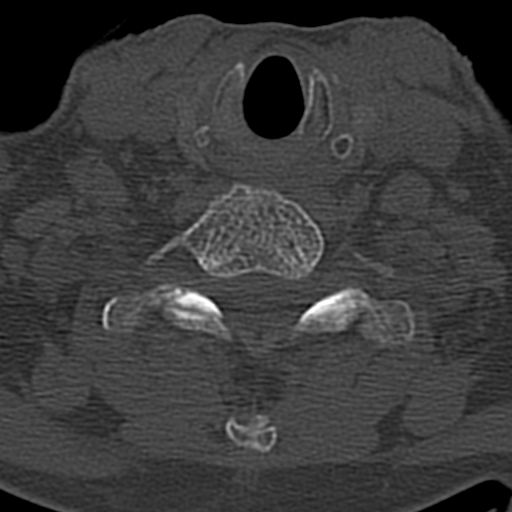
[im 41/82  bone]
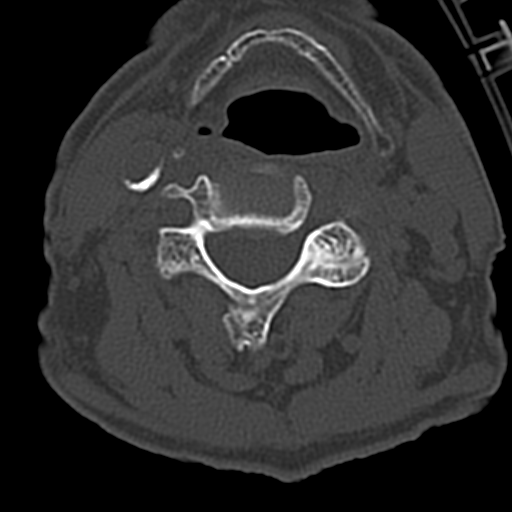
[im 61/82  bone]
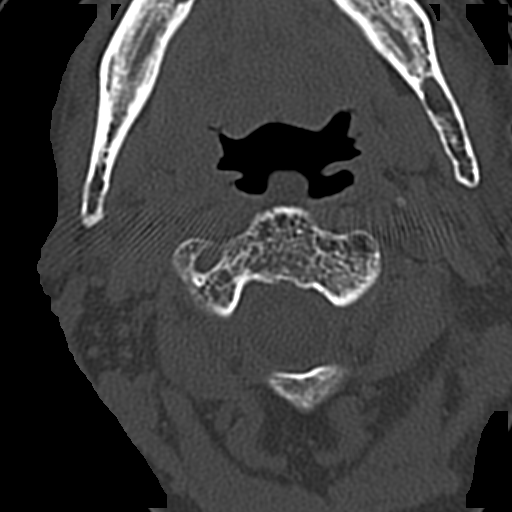

[Series 8: coronal · coronal · 0.23mm/px · 3 of 61 slices shown]
[im 13/61  bone]
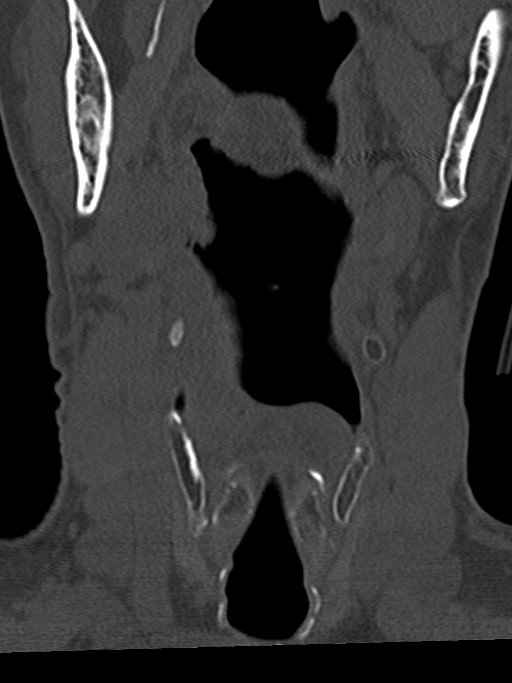
[im 25/61  bone]
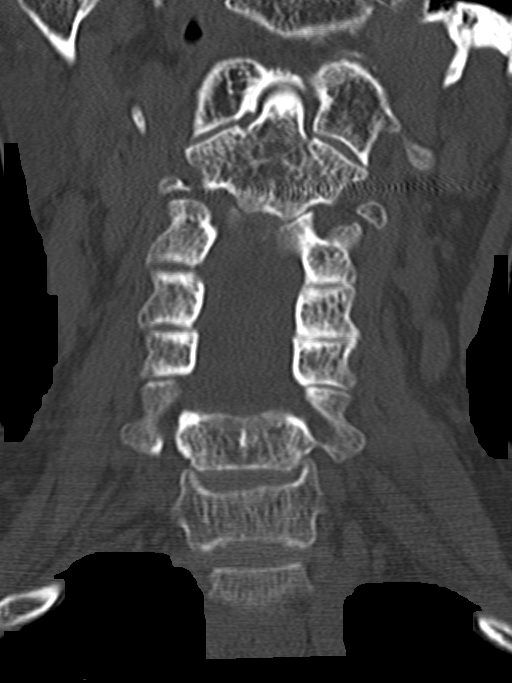
[im 37/61  bone]
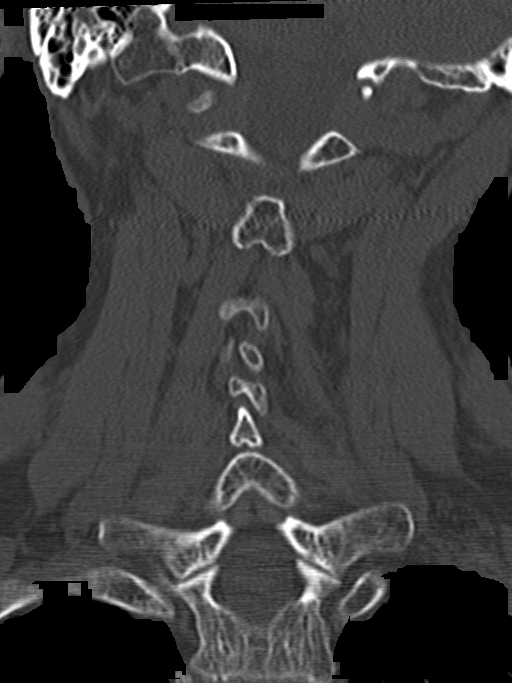

[Series 9: sagittal · sagittal · 0.23mm/px · 5 of 61 slices shown, 6 images]
[im 21/61  bone]
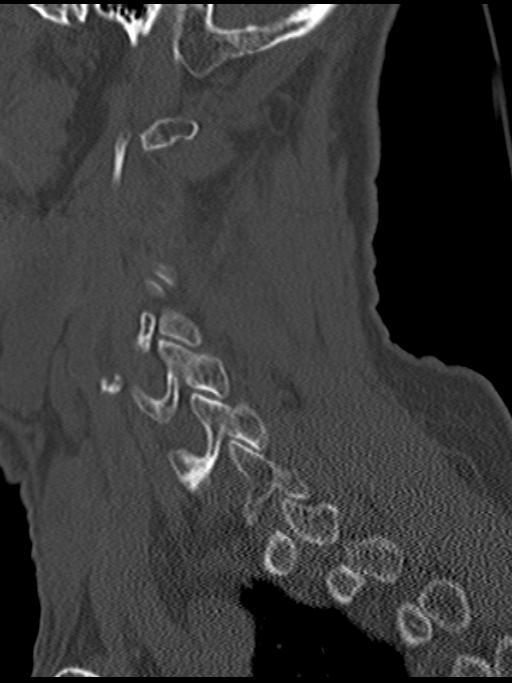
[im 26/61  bone]
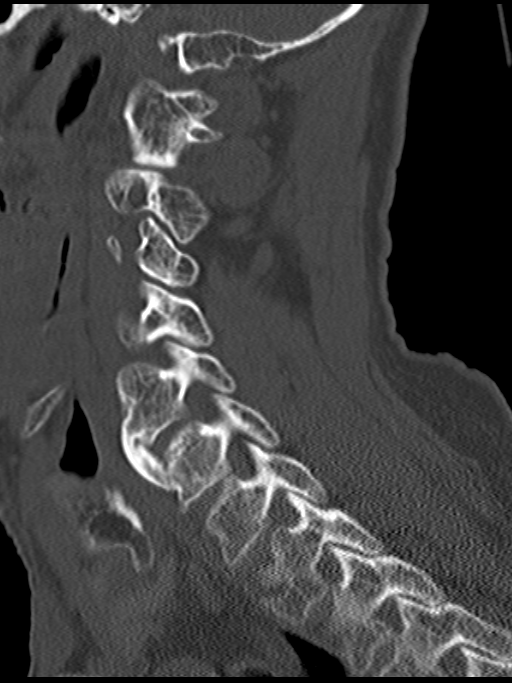
[im 31/61  soft-tissue]
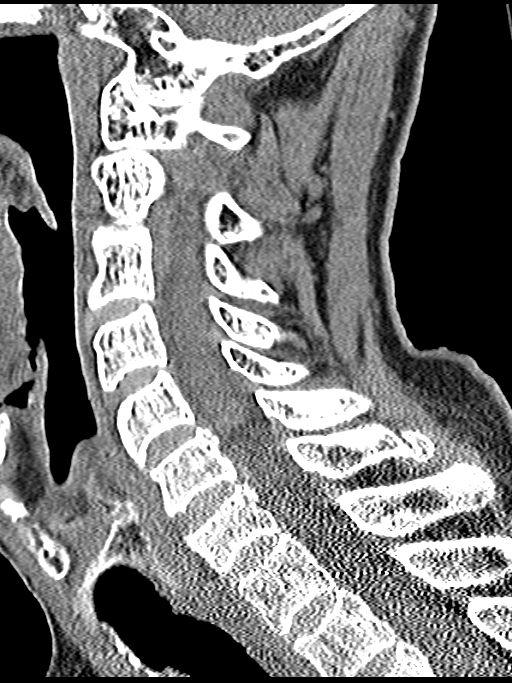
[im 31/61  bone]
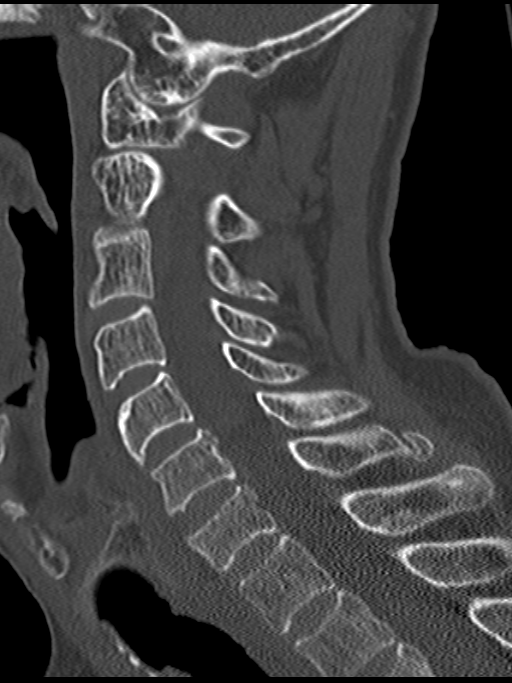
[im 36/61  bone]
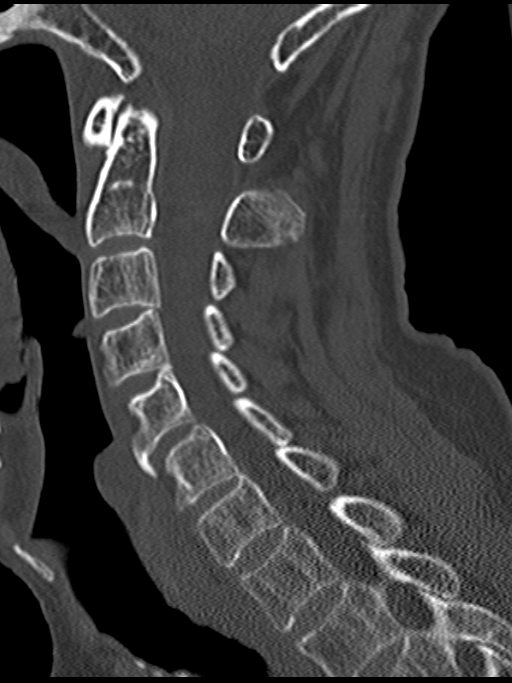
[im 41/61  bone]
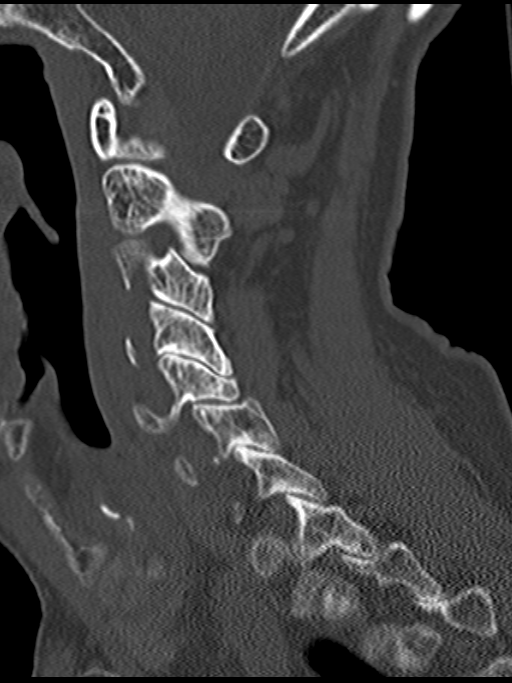

[14 of 33 positions shown; findings below may reference images not displayed]

FINDINGS: CT HEAD FINDINGS

The brain shows generalized atrophy. There are old small vessel
ischemic changes affecting the cerebral hemispheric white matter.
There is old infarction in the left basal ganglia and external
capsule region. No sign of acute infarction, mass lesion,
hemorrhage, hydrocephalus or extra-axial collection. No calvarial
abnormality. No fluid in the sinuses.

CT CERVICAL SPINE FINDINGS

There is curvature convex to the right. No evidence of fracture.
Chronic bridging osteophytes are present at C5-6
IMPRESSION: Head CT: No acute or traumatic finding. Old small vessel infarctions
as outlined above.

Cervical spine CT: No acute or traumatic finding. Chronic curvature
convex to the right with chronic bridging osteophytes anteriorly and
on the right at C5-6.

## 2015-12-10 MED ORDER — BACITRACIN ZINC 500 UNIT/GM EX OINT
1.0000 "application " | TOPICAL_OINTMENT | Freq: Two times a day (BID) | CUTANEOUS | Status: DC
  Administered 2015-12-10 (×2): 1 via TOPICAL
  Filled 2015-12-10: qty 0.9

## 2015-12-10 MED ORDER — THIAMINE HCL 100 MG/ML IJ SOLN
Freq: Once | INTRAVENOUS | Status: AC
  Administered 2015-12-10: 22:00:00 via INTRAVENOUS
  Filled 2015-12-10: qty 1000

## 2015-12-10 MED ORDER — SODIUM CHLORIDE 0.9 % IV BOLUS (SEPSIS)
500.0000 mL | Freq: Once | INTRAVENOUS | Status: AC
  Administered 2015-12-10: 500 mL via INTRAVENOUS

## 2015-12-10 NOTE — ED Notes (Addendum)
Pt to sleepy too ambulate when asked. Pt to sleepy too eat and po fluids when asked. Pt wants to rest.

## 2015-12-10 NOTE — ED Notes (Signed)
Pt comes to Ed via EMS with witnessed fall with ETOH on board. Left home was going for a walk, drinking 2 beers and glass of wine.  Pt reports LOC and has bilateral skin abrasions on arms from fall. Denies no neck and back pain. Pt in wearing a Ct collar. EKG good normal sinus.  Stroke screen negative,  No able to communicate coherently from drinking reported by ems. Pt verbalized he does want detox.   Vs on arrival 127/ 82, hr 73, 10 0SP02  room air cbg 93.

## 2015-12-10 NOTE — ED Notes (Signed)
Bed: [id number] Expected date:  Expected time:  Means of arrival:  Comments: EMS 27M fall, LOC

## 2015-12-10 NOTE — ED Notes (Signed)
Pt in CT.

## 2015-12-10 NOTE — ED Provider Notes (Signed)
[name]: [id number]     Arrival date & time 12/10/15  1948 History   First MD Initiated Contact with Patient 12/10/15 1951     Chief Complaint  Patient presents with  . Fall    ETOH on board     HPI   Ralph Jackson is a 70 y.o. male with a PMH of HTN, alcohol abuse who presents to the ED with witnessed fall. Per EMS, he fell and hit his head and had possible LOC. He notes he was drinking today, and had 1 bottle of wine and 2 large cans of beer. He denies drug use. He denies complaints at this time, including headache, lightheadedness, dizziness, neck pain, back pain, abdominal pain, N/V, numbness, weakness, paresthesia. He denies SI, HI, hallucinations.    Past Medical History  Diagnosis Date  . Hypertension   . Urinary tract infection   . ETOH abuse    History reviewed. No pertinent past surgical history. History reviewed. No pertinent family history. Social History  Substance Use Topics  . Smoking status: Never Smoker   . Smokeless tobacco: Current User  . Alcohol Use: 3.6 oz/week    3 Glasses of wine, 3 Cans of beer per week     Comment: Drinks 1/5 and 2-24oz beers, daily       Review of Systems  Respiratory: Negative for shortness of breath.   Cardiovascular: Negative for chest pain.  Gastrointestinal: Negative for nausea, vomiting and abdominal pain.  Musculoskeletal: Negative for myalgias, back pain, arthralgias and neck pain.  Skin: Positive for wound.  Neurological: Positive for syncope. Negative for weakness, light-headedness, numbness and headaches.  Psychiatric/Behavioral: Negative for suicidal ideas.  All other systems reviewed and are negative.     Allergies  Review of patient's allergies indicates no known allergies.  Home Medications   Prior to Admission medications   Medication Sig Start Date End Date Taking? Authorizing Provider  escitalopram (LEXAPRO) 10 MG tablet Take 10 mg by mouth daily. 11/25/15  Yes Historical Provider, MD  levothyroxine  (SYNTHROID, LEVOTHROID) 25 MCG tablet Take 25 mcg by mouth daily before breakfast.  03/03/15  Yes Historical Provider, MD  metoprolol tartrate (LOPRESSOR) 25 MG tablet Take 25 mg by mouth 2 (two) times daily.  03/01/15  Yes Historical Provider, MD  pravastatin (PRAVACHOL) 10 MG tablet Take 10 mg by mouth daily. 11/26/15  Yes Historical Provider, MD  sertraline (ZOLOFT) 100 MG tablet Take 150 mg by mouth daily. 10/30/15  Yes Historical Provider, MD  acamprosate (CAMPRAL) 333 MG tablet Take 2 tablets (666 mg total) by mouth 3 (three) times daily with meals. Patient not taking: Reported on 12/10/2015 07/11/14   Adonis Brook, NP  lisinopril (PRINIVIL,ZESTRIL) 10 MG tablet Take 1 tablet (10 mg total) by mouth daily. For high blood pressure Patient not taking: Reported on 12/10/2015 05/13/14   Sanjuana Kava, NP  mirtazapine (REMERON) 15 MG tablet Take 1 tablet (15 mg total) by mouth at bedtime. Patient not taking: Reported on 12/10/2015 07/11/14   Adonis Brook, NP    BP 102/78 mmHg  Pulse 77  Temp(Src) 98.6 F (37 C) (Oral)  Resp 17  SpO2 95% Physical Exam  Constitutional: He is oriented to person, place, and time. He appears well-developed and well-nourished. No distress.  Patient smells of alcohol.  HENT:  Head: Normocephalic and atraumatic. Head is without raccoon's eyes, without Battle's sign, without abrasion, without contusion and without laceration.  Right Ear: External ear normal.  Left Ear: External ear normal.  Nose: Nose normal. Right sinus exhibits no maxillary sinus tenderness and no frontal sinus tenderness. Left sinus exhibits no maxillary sinus tenderness and no frontal sinus tenderness.  Mouth/Throat: Uvula is midline, oropharynx is clear and moist and mucous membranes are normal.  Patient in c-collar.   Eyes: Conjunctivae, EOM and lids are normal. Pupils are equal, round, and reactive to light. Right eye exhibits no discharge. Left eye exhibits no discharge. No scleral icterus.  Neck:  Normal range of motion. Neck supple.  C collar in place.  Cardiovascular: Normal rate, regular rhythm, normal heart sounds, intact distal pulses and normal pulses.   Pulmonary/Chest: Effort normal and breath sounds normal. No respiratory distress. He has no wheezes. He has no rales. He exhibits no tenderness.  Abdominal: Soft. Normal appearance and bowel sounds are normal. He exhibits no distension and no mass. There is no tenderness. There is no rigidity, no rebound and no guarding.  Musculoskeletal: Normal range of motion. He exhibits no edema or tenderness.  No TTP to chest wall, upper extremities, lower extremities, pelvis. No TTP to cervical, thoracic, or lumbar spine.   Neurological: He is alert and oriented to person, place, and time. He has normal strength. No cranial nerve deficit or sensory deficit. GCS eye subscore is 4. GCS verbal subscore is 5. GCS motor subscore is 6.  Skin: Skin is warm, dry and intact. No rash noted. He is not diaphoretic. No erythema. No pallor.  Skin tear to right elbow and left wrist, hemostatic. No associated bony TTP.  Psychiatric: He has a normal mood and affect. His speech is normal and behavior is normal.  Nursing note and vitals reviewed.   ED Course  Procedures (including critical care time)  Labs Review Labs Reviewed  CBC WITH DIFFERENTIAL/PLATELET - Abnormal; Notable for the following:    RBC 3.80 (*)    Hemoglobin 11.6 (*)    HCT 34.2 (*)    All other components within normal limits  COMPREHENSIVE METABOLIC PANEL - Abnormal; Notable for the following:    ALT 14 (*)    All other components within normal limits  ETHANOL - Abnormal; Notable for the following:    Alcohol, Ethyl (B) 223 (*)    All other components within normal limits  URINE RAPID DRUG SCREEN, HOSP PERFORMED    Imaging Review Ct Head Wo Contrast  12/10/2015  CLINICAL DATA:  Witnessed fall. Intoxicated. Trauma to the head and neck. EXAM: CT HEAD WITHOUT CONTRAST CT CERVICAL  SPINE WITHOUT CONTRAST TECHNIQUE: Multidetector CT imaging of the head and cervical spine was performed following the standard protocol without intravenous contrast. Multiplanar CT image reconstructions of the cervical spine were also generated. COMPARISON:  07/04/2014 FINDINGS: CT HEAD FINDINGS The brain shows generalized atrophy. There are old small vessel ischemic changes affecting the cerebral hemispheric white matter. 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 No sign of acute infarction, mass lesion, hemorrhage, hydrocephalus or extra-axial collection. No calvarial abnormality. No fluid in the sinuses. CT CERVICAL SPINE FINDINGS There is curvature convex to the right. No evidence of fracture. Chronic bridging osteophytes are present at C5-6 IMPRESSION: Head CT: No acute or traumatic finding. Old small vessel infarctions as outlined above. Cervical spine CT: No acute or traumatic finding. Chronic curvature convex to the right with chronic bridging osteophytes anteriorly and on the right at C5-6. Electronically Signed   By: Paulina FusiMark  Shogry M.D.   On: 12/10/2015 20:54   Ct Cervical Spine Wo Contrast  12/10/2015  CLINICAL DATA:  Witnessed fall. Intoxicated. Trauma to the head and neck. EXAM: CT HEAD WITHOUT CONTRAST CT CERVICAL SPINE WITHOUT CONTRAST TECHNIQUE: Multidetector CT imaging of the head and cervical spine was performed following the standard protocol without intravenous contrast. Multiplanar CT image reconstructions of the cervical spine were also generated. COMPARISON:  07/04/2014 FINDINGS: CT HEAD FINDINGS The brain shows generalized atrophy. There are old small vessel ischemic changes affecting the cerebral hemispheric white matter. 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 No sign of acute infarction, mass lesion, hemorrhage, hydrocephalus or extra-axial collection. No calvarial abnormality. No fluid in the sinuses. CT CERVICAL  SPINE FINDINGS There is curvature convex to the right. No evidence of fracture. Chronic bridging osteophytes are present at C5-6 IMPRESSION: Head CT: No acute or traumatic finding. Old small vessel infarctions as outlined above. Cervical spine CT: No acute or traumatic finding. Chronic curvature convex to the right with chronic bridging osteophytes anteriorly and on the right at C5-6. Electronically Signed   By: Paulina Fusi M.D.   On: 12/10/2015 20:54   I have personally reviewed and evaluated these images and lab results as part of my medical decision-making.   EKG Interpretation None      MDM   Final diagnoses:  Fall  Alcohol abuse    70 year old male presents with alcohol intoxication and fall. Denies complaints at this time, though states he was drinking today and fell while walking. Per EMS, he hit his head. The patient states he may have lost consciousness. He denies headache, lightheadedness, dizziness, neck pain, back pain, abdominal pain, N/V, numbness, weakness, paresthesia, SI, HI, hallucinations.  Patient is afebrile. Vital signs stable. Head normocephalic and atraumatic. GCS 15. Normal neuro exam with no focal deficit. Heart RRR. Lungs clear to auscultation bilaterally. Abdomen soft, non-tender, non-distended. No TTP to chest wall, upper extremities, lower extremities, pelvis. No TTP to cervical, thoracic, or lumbar spine. Skin tear to right elbow and left wrist, hemostatic. No associated bony TTP.   Per record review, tetanus updated in 2015. Wounds cleaned and repaired with steri strips in the ED.  CBC negative for leukocytosis, hemoglobin 11.6. CMP unremarkable. Ethanol elevated at 223. UDS negative. Head CT negative for acute or traumatic findings. Cervical spine CT negative for acute or traumatic findings.  Patient signed out to Marlon Pel, PA-C at shift change. Plan to monitor and discharge once clinically sober and able to ambulate and tolerate PO intake. Will give  resource list for outpatient detox.  BP 102/78 mmHg  Pulse 77  Temp(Src) 98.6 F (37 C) (Oral)  Resp 17  SpO2 95%       Mady Gemma, PA-C 12/11/15 0127  Melene Plan, DO 12/11/15 2342

## 2015-12-11 NOTE — ED Notes (Addendum)
Provider ok with pt resting and finishing fluids

## 2015-12-11 NOTE — ED Provider Notes (Signed)
The patient was handed off by MedtronicEli Westfall, PA-C.  The patient here with intoxication and a fall. He denies Si/Hi, hallucinations, numbness, weakness, N/V, abdominal pain. He has had a Neg head CT and neck CT. The plan is to allow the patient to rest and sober up.  The patient was able to tolerate PO (sanwhich and coffee-black) and ambulated  without difficulty. Patient ready for discharge at this time.  Filed Vitals:   12/10/15 2320 12/11/15 0156  BP: 102/78 102/78  Pulse: 77 78  Temp: 98.6 F (37 C) 98.6 F (37 C)  Resp: 17 [address][email or web address]     Candita Borenstein, PA-C 12/11/15 [id number]  Shon Batonourtney F Horton, MD 12/11/15 [phone]

## 2015-12-11 NOTE — ED Notes (Signed)
Pt ambulated with assistance in room and hallway. Pt given coffee and and sand witch. Pt ADL performed bathed and bed changed.  Pt alert and oriented. Pt siting in chair up right eating without assistance.

## 2015-12-11 NOTE — Discharge Instructions (Signed)
1. Medications: usual home medications 2. Treatment: rest, drink plenty of fluids 3. Follow Up: please followup with your primary doctor for discussion of your diagnoses and further evaluation after today's visit; please return to the ER for new or worsening symptoms; use the resource list for outpatient detox   Alcohol Use Disorder Alcohol use disorder is a mental disorder. It is not a one-time incident of heavy drinking. Alcohol use disorder is the excessive and uncontrollable use of alcohol over time that leads to problems with functioning in one or more areas of daily living. People with this disorder risk harming themselves and others when they drink to excess. Alcohol use disorder also can cause other mental disorders, such as mood and anxiety disorders, and serious physical problems. People with alcohol use disorder often misuse other drugs.  Alcohol use disorder is common and widespread. Some people with this disorder drink alcohol to cope with or escape from negative life events. Others drink to relieve chronic pain or symptoms of mental illness. People with a family history of alcohol use disorder are at higher risk of losing control and using alcohol to excess.  Drinking too much alcohol can cause injury, accidents, and health problems. One drink can be too much when you are:  Working.  Pregnant or breastfeeding.  Taking medicines. Ask your doctor.  Driving or planning to drive. SYMPTOMS  Signs and symptoms of alcohol use disorder may include the following:   Consumption ofalcohol inlarger amounts or over a longer period of time than intended.  Multiple unsuccessful attempts to cutdown or control alcohol use.   A great deal of time spent obtaining alcohol, using alcohol, or recovering from the effects of alcohol (hangover).  A strong desire or urge to use alcohol (cravings).   Continued use of alcohol despite problems at work, school, or home because of alcohol use.    Continued use of alcohol despite problems in relationships because of alcohol use.  Continued use of alcohol in situations when it is physically hazardous, such as driving a car.  Continued use of alcohol despite awareness of a physical or psychological problem that is likely related to alcohol use. Physical problems related to alcohol use can involve the brain, heart, liver, stomach, and intestines. Psychological problems related to alcohol use include intoxication, depression, anxiety, psychosis, delirium, and dementia.   The need for increased amounts of alcohol to achieve the same desired effect, or a decreased effect from the consumption of the same amount of alcohol (tolerance).  Withdrawal symptoms upon reducing or stopping alcohol use, or alcohol use to reduce or avoid withdrawal symptoms. Withdrawal symptoms include:  Racing heart.  Hand tremor.  Difficulty sleeping.  Nausea.  Vomiting.  Hallucinations.  Restlessness.  Seizures. DIAGNOSIS Alcohol use disorder is diagnosed through an assessment by your health care provider. Your health care provider may start by asking three or four questions to screen for excessive or problematic alcohol use. To confirm a diagnosis of alcohol use disorder, at least two symptoms must be present within a 64-month period. The severity of alcohol use disorder depends on the number of symptoms:  Mild--two or three.  Moderate--four or five.  Severe--six or more. Your health care provider may perform a physical exam or use results from lab tests to see if you have physical problems resulting from alcohol use. Your health care provider may refer you to a mental health professional for evaluation. TREATMENT  Some people with alcohol use disorder are able to reduce their alcohol  use to low-risk levels. Some people with alcohol use disorder need to quit drinking alcohol. When necessary, mental health professionals with specialized training in  substance use treatment can help. Your health care provider can help you decide how severe your alcohol use disorder is and what type of treatment you need. The following forms of treatment are available:   Detoxification. Detoxification involves the use of prescription medicines to prevent alcohol withdrawal symptoms in the first week after quitting. This is important for people with a history of symptoms of withdrawal and for heavy drinkers who are likely to have withdrawal symptoms. Alcohol withdrawal can be dangerous and, in severe cases, cause death. Detoxification is usually provided in a hospital or in-patient substance use treatment facility.  Counseling or talk therapy. Talk therapy is provided by substance use treatment counselors. It addresses the reasons people use alcohol and ways to keep them from drinking again. The goals of talk therapy are to help people with alcohol use disorder find healthy activities and ways to cope with life stress, to identify and avoid triggers for alcohol use, and to handle cravings, which can cause relapse.  Medicines.Different medicines can help treat alcohol use disorder through the following actions:  Decrease alcohol cravings.  Decrease the positive reward response felt from alcohol use.  Produce an uncomfortable physical reaction when alcohol is used (aversion therapy).  Support groups. Support groups are run by people who have quit drinking. They provide emotional support, advice, and guidance. These forms of treatment are often combined. Some people with alcohol use disorder benefit from intensive combination treatment provided by specialized substance use treatment centers. Both inpatient and outpatient treatment programs are available.   This information is not intended to replace advice given to you by your health care provider. Make sure you discuss any questions you have with your health care provider.   Document Released: 09/26/2004 Document  Revised: 09/09/2014 Document Reviewed: 11/26/2012 Elsevier Interactive Patient Education 2016 [employer]. [employer] Guide Outpatient Counseling/Substance Abuse Adult The United Ways 211 is a great source of information about community services available.  Access by dialing 2-1-1 from anywhere in [place], or by website -  PooledIncome.pl.   Other Local Resources (Updated 09/2015)  Crisis Hotlines   Services     Area Served  [employer]  Crisis Hotline, available 24 hours a day, 7 days a week: 506-086-9740 [facility], [place]   [facility]  Crisis Hotline, available 24 hours a day, 7 days a week: 662-827-8419 [facility], [place]  [facility]  Suicide Prevention Hotline, available 24 hours a day, 7 days a week: (930)426-7816 [facility], [place]  [employer], available 24 hours a day, 7 days a week: [phone] [facility], [place]   [facility] Access to [employer], available 24 hours a day, 7 days a week: (567) 555-1700 All   [facility]  Crisis Hotline, available 24 hours a day, 7 days a week: 775 686 1447 All   Other Local Resources (Updated 09/2015)  Outpatient Counseling/ Substance Abuse Programs  Services     Address and Phone Number  ADS (Alcohol and Drug Services)   Options include Individual counseling, group counseling, intensive outpatient program (several hours a day, several days a week)  Offers depression assessments  Provides methadone maintenance program [phone] [address] [place], [place] [address]   [facility]   Offers partial hospitalization/day treatment and DUI/DWI programs  [employer], private insurance [phone], [address] [place], [place] [address]  [facility]    Services include intensive outpatient program (several hours a day, several days a week), outpatient treatment, DUI/DWI  services, family education  Also has some services specifically for [employer]  Offers transitional housing  [phone] [address] [place], [place] [address]     [facility]  Accepts Medicare, private pay, and private insurance [phone] [address], [address] [place], [place] [address]  [facility]  Services include individual counseling, substance abuse intensive outpatient program (several hours a day, several days a week), day treatment  Delene Lollccepts Medicare, Medicaid, private insurance [phone] [address], [address] [place], [place] [address]  [facility]   Offers substance abuse intensive outpatient program (several hours a day, several days a week), partial hospitalization program [phone] [address] [place], [place] [address]  [phone] [address] [place], [place] [address]  [phone] [address] [place], [place] [address]  [phone] [address] [place], [place] [address]  [facility]  Individual counseling only  Accepts private insurance only [phone] [address], [address] [place], [place] [address]  [facility]  Methadone maintenance program [phone] [address] [place], [place] [address]  [facility]  Walk-In Clinic providing substance abuse and mental health counseling  Accepts Medicaid, Medicare, private insurance  Offers sliding scale for uninsured [phone] [address] [place], [place]   [facility] [employer]  Offers individual counseling, and intensive in-home services [phone] [address], [address] [place], [place] [address]  [facility]  Offers individual counseling, family counseling, group therapy, domestic violence counseling, consumer credit counseling  Accepts Medicare, Medicaid, private insurance  Offers sliding scale  for uninsured [phone] [address] [place], [place] [address]  [phone] [facility], [address] [place], [place] [address]  [facility]  Offers individual, family and group counseling  3 locations - [place], [place], and [place]  [phone]  [address] [place], [place] [address]  [address] [place], [place] [address]  [address] [place], [place] [address]  [facility]    Offers psychiatric assessment, 8-week Intensive Outpatient Program (several hours a day, several times a week, daytime or evenings), early recovery group, family Program, medication management  Private pay or private insurance only [phone], or  [phone] [address] [place], [place] [address]  [facility]  Offers individual, couples and family counseling  Accepts Medicaid, private insurance, and sliding scale for uninsured [phone] [address] [place], [place] [address]  Len Blalockavid Fuller, MD  Individual counseling  Private insurance [phone] [address] [place], [place] [address]  [facility]   Offers assessment, substance abuse treatment, and behavioral health treatment [phone] [address] [place], [place] [address]  [facility]  Individual counseling  Accepts private insurance [phone] [address] [place], [place] [address]  [facility]  Individual counseling  Delene Lollccepts Medicare, private insurance [phone] [address] [place], [place] [address]  [facility]    Offers intensive outpatient program (several hours a day, several times a week)  Private pay, private insurance [phone] [facility] [address] [facility] [place], [place]  [facility]  Individual counseling  Medicare, private insurance [phone] [address], [address] [place], [place] [address]  [facility]     Offers intensive outpatient program (  several hours a day, several times a week) and partial hospitalization program [phone] [address] [place], [place] [address]  Emerson Monte, MD  Individual counseling [phone] [address] [place], [place] [address]  [facility]  Offers Christian counseling to individuals, couples, and families  Accepts Medicare and private insurance; offers sliding scale for uninsured [phone] [address] [place], [place] [address]  [facility] [phone] [address] [place], [place] [address]  [facility] crisis counseling, individual counseling, group therapy, in-home therapy, domestic violence services, day treatment, DWI services, Administrator, arts (CST), Doctor, hospital (ACTT), substance abuse Intensive Outpatient Program (several hours a day, several times a week)  2 locations - [facility] and [place] [phone] [address] [place], [place] [address]  [phone] [address] [place], [place] [address]  [facility]     Individual counseling and group therapy  [employer], [place], [place] [phone] [address] [place], [place]  [facility]  Offers individual and family counseling  Offers LGBTQ services  Accepts private insurance and private pay [phone] [address] [place], [place] [address]  [facility]    Offers individual counseling, group therapy, and outpatient detox  Accepts private insurance [phone] [address] [place], [place]  [facility]  Individual counseling  Accepts Medicare, private insurance [phone] [address] [place], [place] [address]  [employer]  Individual counseling  Accepts Medicare, private  insurance [phone] [address] [place], [place] [address]  [facility]   Offers substance abuse Intensive Outpatient Program (several hours a day, several times a week) [phone], or [phone] [place], [place]

## 2015-12-16 ENCOUNTER — Emergency Department (HOSPITAL_COMMUNITY)
Admission: EM | Admit: 2015-12-16 | Discharge: 2015-12-16 | Disposition: A | Discharge status: Home / Self Care | Payer: Medicare Other | Attending: Emergency Medicine | Admitting: Emergency Medicine

## 2015-12-16 ENCOUNTER — Encounter (HOSPITAL_COMMUNITY): Payer: Self-pay | Admitting: Emergency Medicine

## 2015-12-16 DIAGNOSIS — Z79899 Other long term (current) drug therapy: Secondary | ICD-10-CM | POA: Diagnosis not present

## 2015-12-16 DIAGNOSIS — I10 Essential (primary) hypertension: Secondary | ICD-10-CM | POA: Insufficient documentation

## 2015-12-16 DIAGNOSIS — Z87440 Personal history of urinary (tract) infections: Secondary | ICD-10-CM | POA: Diagnosis not present

## 2015-12-16 DIAGNOSIS — F329 Major depressive disorder, single episode, unspecified: Secondary | ICD-10-CM | POA: Diagnosis not present

## 2015-12-16 DIAGNOSIS — F1010 Alcohol abuse, uncomplicated: Secondary | ICD-10-CM | POA: Insufficient documentation

## 2015-12-16 HISTORY — DX: Major depressive disorder, single episode, unspecified: F32.9

## 2015-12-16 HISTORY — DX: Depression, unspecified: F32.A

## 2015-12-16 LAB — RAPID URINE DRUG SCREEN, HOSP PERFORMED
AMPHETAMINES: NOT DETECTED
Barbiturates: NOT DETECTED
Benzodiazepines: NOT DETECTED
COCAINE: NOT DETECTED
OPIATES: NOT DETECTED
TETRAHYDROCANNABINOL: NOT DETECTED

## 2015-12-16 LAB — COMPREHENSIVE METABOLIC PANEL
ALT: 15 U/L — ABNORMAL LOW (ref 17–63)
AST: 21 U/L (ref 15–41)
Albumin: 3.3 g/dL — ABNORMAL LOW (ref 3.5–5.0)
Alkaline Phosphatase: 64 U/L (ref 38–126)
Anion gap: 9 (ref 5–15)
BUN: 12 mg/dL (ref 6–20)
CHLORIDE: 107 mmol/L (ref 101–111)
CO2: 28 mmol/L (ref 22–32)
Calcium: 9.2 mg/dL (ref 8.9–10.3)
Creatinine, Ser: 1.07 mg/dL (ref 0.61–1.24)
Glucose, Bld: 113 mg/dL — ABNORMAL HIGH (ref 65–99)
POTASSIUM: 3.7 mmol/L (ref 3.5–5.1)
Sodium: 144 mmol/L (ref 135–145)
Total Bilirubin: 0.8 mg/dL (ref 0.3–1.2)
Total Protein: 6 g/dL — ABNORMAL LOW (ref 6.5–8.1)

## 2015-12-16 LAB — ETHANOL

## 2015-12-16 LAB — CBC
HCT: 33.6 % — ABNORMAL LOW (ref 39.0–52.0)
HEMOGLOBIN: 10.9 g/dL — AB (ref 13.0–17.0)
MCH: 29.3 pg (ref 26.0–34.0)
MCHC: 32.4 g/dL (ref 30.0–36.0)
MCV: 90.3 fL (ref 78.0–100.0)
PLATELETS: 286 10*3/uL (ref 150–400)
RBC: 3.72 MIL/uL — AB (ref 4.22–5.81)
RDW: 14.2 % (ref 11.5–15.5)
WBC: 7.4 10*3/uL (ref 4.0–10.5)

## 2015-12-16 MED ORDER — THIAMINE HCL 100 MG/ML IJ SOLN: 100.0000 mg | Freq: Every day | INTRAMUSCULAR | Status: DC

## 2015-12-16 MED ORDER — CHLORDIAZEPOXIDE HCL 25 MG PO CAPS: ORAL_CAPSULE | ORAL | Status: DC

## 2015-12-16 MED ORDER — VITAMIN B-1 100 MG PO TABS
100.0000 mg | ORAL_TABLET | Freq: Every day | ORAL | Status: DC
  Administered 2015-12-16: 100 mg via ORAL
  Filled 2015-12-16: qty 1

## 2015-12-16 MED ORDER — LORAZEPAM 1 MG PO TABS: 0.0000 mg | ORAL_TABLET | Freq: Two times a day (BID) | ORAL | Status: DC

## 2015-12-16 MED ORDER — LORAZEPAM 1 MG PO TABS: 0.0000 mg | ORAL_TABLET | Freq: Four times a day (QID) | ORAL | Status: DC

## 2015-12-16 NOTE — ED Provider Notes (Addendum)
[name]: [id number]     Arrival date & time 12/16/15  1524 History   First MD Initiated Contact with Patient 12/16/15 1707     Chief Complaint  Patient presents with  . Alcohol Problem     (Consider location/radiation/quality/duration/timing/severity/associated sxs/prior Treatment) Patient is a 70 y.o. male presenting with alcohol problem. The history is provided by the patient.  Alcohol Problem This is a chronic problem. Episode onset: 40 years. The problem occurs constantly. The problem has not changed since onset.Associated symptoms comments: States he drinks too much and his daughter states that he has to get help or he can't live with her anymore.  Pt states he drinks too much and it makes him vomit.  He stopped drinking last night.  Had 1 episode of vomiting this morning.  Denies abd pain, SOB, chest pain, palpitations or dizziness.  Currently states he feels pretty good.. Nothing aggravates the symptoms. Nothing relieves the symptoms. He has tried nothing for the symptoms. The treatment provided no relief.    Past Medical History  Diagnosis Date  . Hypertension   . Urinary tract infection   . ETOH abuse   . Depression    History reviewed. No pertinent past surgical history. History reviewed. No pertinent family history. Social History  Substance Use Topics  . Smoking status: Never Smoker   . Smokeless tobacco: Current User  . Alcohol Use: 3.6 oz/week    3 Glasses of wine, 3 Cans of beer per week     Comment: Pt states he drinks 3/5 of wine daily    Review of Systems  All other systems reviewed and are negative.     Allergies  Review of patient's allergies indicates no known allergies.  Home Medications   Prior to Admission medications   Medication Sig Start Date End Date Taking? Authorizing Provider  acamprosate (CAMPRAL) 333 MG tablet Take 2 tablets (666 mg total) by mouth 3 (three) times daily with meals. Patient not taking: Reported on 12/10/2015 07/11/14   Adonis BrookSheila  Agustin, NP  escitalopram (LEXAPRO) 10 MG tablet Take 10 mg by mouth daily. 11/25/15   Historical Provider, MD  levothyroxine (SYNTHROID, LEVOTHROID) 25 MCG tablet Take 25 mcg by mouth daily before breakfast.  03/03/15   Historical Provider, MD  lisinopril (PRINIVIL,ZESTRIL) 10 MG tablet Take 1 tablet (10 mg total) by mouth daily. For high blood pressure Patient not taking: Reported on 12/10/2015 05/13/14   Sanjuana KavaAgnes I Nwoko, NP  metoprolol tartrate (LOPRESSOR) 25 MG tablet Take 25 mg by mouth 2 (two) times daily.  03/01/15   Historical Provider, MD  mirtazapine (REMERON) 15 MG tablet Take 1 tablet (15 mg total) by mouth at bedtime. Patient not taking: Reported on 12/10/2015 07/11/14   Adonis BrookSheila Agustin, NP  pravastatin (PRAVACHOL) 10 MG tablet Take 10 mg by mouth daily. 11/26/15   Historical Provider, MD  sertraline (ZOLOFT) 100 MG tablet Take 150 mg by mouth daily. 10/30/15   Historical Provider, MD   BP 121/70 mmHg  Pulse 90  Resp 20  Wt 101 lb (45.813 kg)  SpO2 98% Physical Exam  Constitutional: He is oriented to person, place, and time. He appears well-developed. He appears cachectic. No distress.  HENT:  Head: Normocephalic and atraumatic.  Mouth/Throat: Oropharynx is clear and moist.  Eyes: Conjunctivae and EOM are normal. Pupils are equal, round, and reactive to light.  Neck: Normal range of motion. Neck supple.  Cardiovascular: Normal rate, regular rhythm and intact distal pulses.   No murmur heard. Pulmonary/Chest:  Effort normal and breath sounds normal. No respiratory distress. He has no wheezes. He has no rales.  Abdominal: Soft. He exhibits no distension. There is no tenderness. There is no rebound and no guarding.  Musculoskeletal: Normal range of motion. He exhibits no edema or tenderness.  Neurological: He is alert and oriented to person, place, and time.  Skin: Skin is warm and dry. No rash noted. No erythema.  Psychiatric: He has a normal mood and affect. His behavior is normal. He does  not exhibit a depressed mood. He expresses no homicidal and no suicidal ideation.  Nursing note and vitals reviewed.   ED Course  Procedures (including critical care time) Labs Review Labs Reviewed  CBC - Abnormal; Notable for the following:    RBC 3.72 (*)    Hemoglobin 10.9 (*)    HCT 33.6 (*)    All other components within normal limits  COMPREHENSIVE METABOLIC PANEL - Abnormal; Notable for the following:    Glucose, Bld 113 (*)    Total Protein 6.0 (*)    Albumin 3.3 (*)    ALT 15 (*)    All other components within normal limits  ETHANOL  URINE RAPID DRUG SCREEN, HOSP PERFORMED    Imaging Review No results found. I have personally reviewed and evaluated these images and lab results as part of my medical decision-making.   EKG Interpretation None      MDM   Final diagnoses:  Alcohol abuse    Patient is a 70 year old male with a history of chronic alcohol abuse presenting today requesting detox. Patient last drank last night and is currently displaying no signs of withdrawal. Vital signs are normal. Patient's labs without acute findings. No anion gap or evidence of DVT hydration. Patient's alcohol level is less than 5. He denies any problems with his depression today and denies any SI or HI. Patient was given Librium and outpatient resources.    Gwyneth Sprout, MD 12/16/15 1759  Gwyneth Sprout, MD 12/16/15 7846

## 2015-12-16 NOTE — ED Notes (Signed)
This RN asked patient if she could call patient's daughter to review POC, and patient asked RN not to contact family members.

## 2015-12-16 NOTE — ED Notes (Signed)
Pt has been able to tolerate water with PO meds, black decaf coffee and a Mal[place]key sandwich.  Denies n/v.

## 2015-12-16 NOTE — ED Notes (Signed)
Pt states he had 3 bottles of wine last night and 3 tall boys of beer this morning. Pt states he has been drinking 40 plus years and knows he needs to stop. Pt states he feels anxious and somewhat shaky but denies HA.

## 2015-12-16 NOTE — Discharge Instructions (Signed)

## 2015-12-16 NOTE — ED Notes (Signed)
Patient able to ambulate independently  

## 2015-12-16 NOTE — ED Notes (Signed)
MD at bedside. 

## 2017-07-03 DIAGNOSIS — Z01818 Encounter for other preprocedural examination: Secondary | ICD-10-CM | POA: Diagnosis not present

## 2019-09-13 ENCOUNTER — Other Ambulatory Visit: Payer: Self-pay

## 2019-09-13 ENCOUNTER — Emergency Department (HOSPITAL_COMMUNITY)
Admission: EM | Admit: 2019-09-13 | Discharge: 2019-09-14 | Disposition: A | Discharge status: Home / Self Care | Payer: Medicare HMO | Attending: Emergency Medicine | Admitting: Emergency Medicine

## 2019-09-13 ENCOUNTER — Encounter (HOSPITAL_COMMUNITY): Payer: Self-pay | Admitting: Emergency Medicine

## 2019-09-13 DIAGNOSIS — R531 Weakness: Secondary | ICD-10-CM | POA: Diagnosis present

## 2019-09-13 DIAGNOSIS — I10 Essential (primary) hypertension: Secondary | ICD-10-CM | POA: Diagnosis not present

## 2019-09-13 DIAGNOSIS — Z79899 Other long term (current) drug therapy: Secondary | ICD-10-CM | POA: Insufficient documentation

## 2019-09-13 DIAGNOSIS — N3001 Acute cystitis with hematuria: Secondary | ICD-10-CM | POA: Insufficient documentation

## 2019-09-13 DIAGNOSIS — F17228 Nicotine dependence, chewing tobacco, with other nicotine-induced disorders: Secondary | ICD-10-CM | POA: Insufficient documentation

## 2019-09-13 LAB — COMPREHENSIVE METABOLIC PANEL
ALT: 11 U/L (ref 0–44)
AST: 15 U/L (ref 15–41)
Albumin: 3.4 g/dL — ABNORMAL LOW (ref 3.5–5.0)
Alkaline Phosphatase: 61 U/L (ref 38–126)
Anion gap: 8 (ref 5–15)
BUN: 16 mg/dL (ref 8–23)
CO2: 27 mmol/L (ref 22–32)
Calcium: 8.9 mg/dL (ref 8.9–10.3)
Chloride: 104 mmol/L (ref 98–111)
Creatinine, Ser: 1.03 mg/dL (ref 0.61–1.24)
GFR calc Af Amer: >60 mL/min (ref >60)
GFR calc non Af Amer: >60 mL/min (ref >60)
Glucose, Bld: 88 mg/dL (ref 70–99)
Potassium: 3.9 mmol/L (ref 3.5–5.1)
Sodium: 139 mmol/L (ref 135–145)
Total Bilirubin: 0.8 mg/dL (ref 0.3–1.2)
Total Protein: 6.4 g/dL — ABNORMAL LOW (ref 6.5–8.1)

## 2019-09-13 LAB — CBC WITH DIFFERENTIAL/PLATELET
Abs Immature Granulocytes: 0.02 10*3/uL (ref 0.00–0.07)
Basophils Absolute: 0 10*3/uL (ref 0.0–0.1)
Basophils Relative: 0 %
Eosinophils Absolute: 0 10*3/uL (ref 0.0–0.5)
Eosinophils Relative: 0 %
HCT: 40.4 % (ref 39.0–52.0)
Hemoglobin: 13.5 g/dL (ref 13.0–17.0)
Immature Granulocytes: 0 %
Lymphocytes Relative: 29 %
Lymphs Abs: 2.3 10*3/uL (ref 0.7–4.0)
MCH: 31 pg (ref 26.0–34.0)
MCHC: 33.4 g/dL (ref 30.0–36.0)
MCV: 92.9 fL (ref 80.0–100.0)
Monocytes Absolute: 0.7 10*3/uL (ref 0.1–1.0)
Monocytes Relative: 9 %
Neutro Abs: 4.7 10*3/uL (ref 1.7–7.7)
Neutrophils Relative %: 62 %
Platelets: 265 10*3/uL (ref 150–400)
RBC: 4.35 MIL/uL (ref 4.22–5.81)
RDW: 12.4 % (ref 11.5–15.5)
WBC: 7.8 10*3/uL (ref 4.0–10.5)
nRBC: 0 % (ref 0.0–0.2)

## 2019-09-13 LAB — URINALYSIS, ROUTINE W REFLEX MICROSCOPIC
Bilirubin Urine: NEGATIVE
Glucose, UA: NEGATIVE mg/dL
Hgb urine dipstick: NEGATIVE
Ketones, ur: NEGATIVE mg/dL
Nitrite: POSITIVE — AB
Protein, ur: NEGATIVE mg/dL
Specific Gravity, Urine: 1.014 (ref 1.005–1.030)
WBC, UA: >50 WBC/hpf — ABNORMAL HIGH (ref 0–5)
pH: 5 (ref 5.0–8.0)

## 2019-09-13 NOTE — ED Triage Notes (Signed)
Patient reports bilateral legs weakness" shaky" / generalized weakness onset last week . Denies fever or chills .

## 2019-09-14 ENCOUNTER — Emergency Department (HOSPITAL_COMMUNITY): Payer: Medicare HMO

## 2019-09-14 DIAGNOSIS — N3001 Acute cystitis with hematuria: Secondary | ICD-10-CM | POA: Diagnosis not present

## 2019-09-14 IMAGING — DX DG CHEST 1V PORT
1 series · 1 of 1 positions shown · non-contrast
Comparison: [DATE]

CLINICAL DATA: Tachypnea

EXAM:
PORTABLE CHEST 1 VIEW

[chest ap]
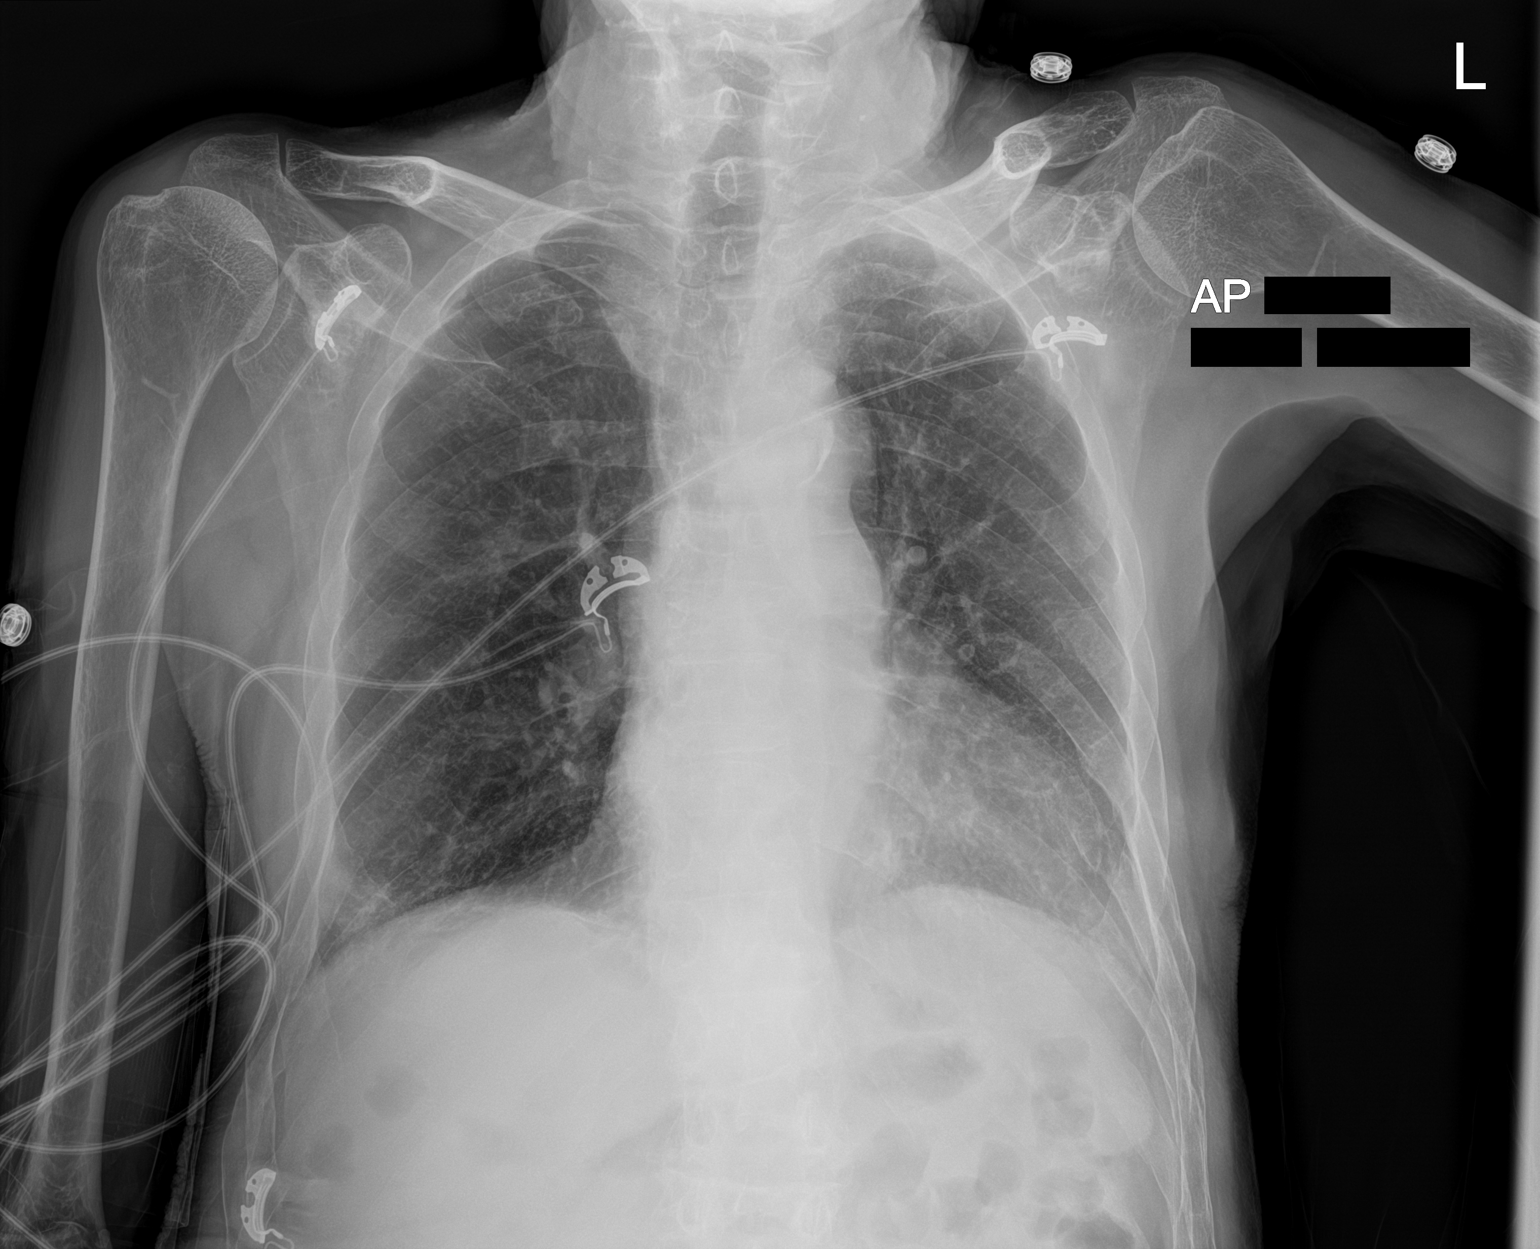

[1 of 1 positions shown; findings below may reference images not displayed]

FINDINGS: There is slight bibasilar atelectasis. There is an equivocal left
pleural effusion. Lungs elsewhere are clear. Heart size and
pulmonary vascularity are normal. No adenopathy. There is aortic
atherosclerosis. No adenopathy.

There are healed rib fractures on the left, stable. Bones are
osteoporotic.
IMPRESSION: Mild bibasilar atelectasis with equivocal left pleural effusion.
Lungs elsewhere clear. Cardiac silhouette stable and within normal
limits. No adenopathy. Bones osteoporotic. Aortic Atherosclerosis
([O6]-[O6]).

## 2019-09-14 MED ORDER — CEFTRIAXONE SODIUM 1 G IJ SOLR
1.0000 g | Freq: Once | INTRAMUSCULAR | Status: AC
Start: 2019-09-14 — End: 2019-09-14
  Administered 2019-09-14: 09:00:00 1 g via INTRAMUSCULAR
  Filled 2019-09-14: qty 10

## 2019-09-14 MED ORDER — LEVOFLOXACIN 750 MG PO TABS: 750.0000 mg | ORAL_TABLET | Freq: Every day | ORAL | 0 refills | Status: DC

## 2019-09-14 MED ORDER — LIDOCAINE HCL (PF) 1 % IJ SOLN
INTRAMUSCULAR | Status: AC
  Administered 2019-09-14: 5 mL
  Filled 2019-09-14: qty 5

## 2019-09-14 NOTE — ED Provider Notes (Signed)
[facility]PART[facility] Provider Note   [name]: [id number] Arrival date & time: 09/13/19  1941     History Chief Complaint  Patient presents with  . Leg Weakness     HPI   Blood pressure (!) 144/94, pulse 72, temperature 97.7 F (36.5 C), temperature source Oral, resp. rate (!) 23, SpO2 98 %.  Ralph Jackson is a 74 y.o. male complaining of generalized bilateral lower extremity weakness not necessarily worsening over the course of approximately last week.  There is no associated incontinence, back pain, upper extremity weakness, numbness.  There has been no falls.  He denies any headache, dysarthria, ataxia.  He has a history of alcohol abuse but states he has not had any in the last 4 years.  He has been eating and drinking normally.  He states, on review of systems that he was short of breath but he is not anymore.  He denies any cough, fever, rhinorrhea.      Past Medical History:  Diagnosis Date  . Depression   . ETOH abuse   . Hypertension   . Urinary tract infection     Patient Active Problem List   Diagnosis Date Noted  . Protein-calorie malnutrition, severe ([place]) 07/08/2014  . TSH elevation 07/08/2014  . FTT (failure to thrive) in adult 07/08/2014  . Alcohol dependence with alcohol-induced mood disorder ([place])   . Alcohol abuse 07/05/2014  . Alcohol dependence ([place]) 04/04/2014    History reviewed. No pertinent surgical history.     No family history on file.  Social History   Tobacco Use  . Smoking status: Never Smoker  . Smokeless tobacco: Current User  Substance Use Topics  . Alcohol use: Yes    Alcohol/week: 6.0 standard drinks    Types: 3 Glasses of wine, 3 Cans of beer per week    Comment: Pt states he drinks 3/5 of wine daily  . Drug use: No    Home Medications Prior to Admission medications   Medication Sig Start Date End Date Taking? Authorizing Provider  acamprosate (CAMPRAL) 333 MG tablet Take 2 tablets (666  mg total) by mouth 3 (three) times daily with meals. Patient not taking: Reported on 12/10/2015 07/11/14   Kerrie Buffalo, NP  chlordiazePOXIDE (LIBRIUM) 25 MG capsule 50mg  PO TID x 1D, then 25-50mg  PO BID X 1D, then 25-50mg  PO QD X 1D 12/16/15   Blanchie Dessert, MD  escitalopram (LEXAPRO) 10 MG tablet Take 10 mg by mouth daily. 11/25/15   [provider]  levofloxacin (LEVAQUIN) 750 MG tablet Take 1 tablet (750 mg total) by mouth daily. 09/14/19   Shanna Strength, Elmyra Ricks, PA-C  levothyroxine (SYNTHROID, LEVOTHROID) 25 MCG tablet Take 25 mcg by mouth daily before breakfast.  03/03/15   [provider]  lisinopril (PRINIVIL,ZESTRIL) 10 MG tablet Take 1 tablet (10 mg total) by mouth daily. For high blood pressure Patient not taking: Reported on 12/10/2015 05/13/14   Lindell Spar I, NP  metoprolol tartrate (LOPRESSOR) 25 MG tablet Take 25 mg by mouth 2 (two) times daily.  03/01/15   [provider]  mirtazapine (REMERON) 15 MG tablet Take 1 tablet (15 mg total) by mouth at bedtime. Patient not taking: Reported on 12/10/2015 07/11/14   Kerrie Buffalo, NP  pravastatin (PRAVACHOL) 10 MG tablet Take 10 mg by mouth daily. 11/26/15   [provider]  sertraline (ZOLOFT) 100 MG tablet Take 150 mg by mouth daily. 10/30/15   [provider]    Allergies  Patient has no known allergies.  Review of Systems   Review of Systems   A complete review of systems was obtained and all systems are negative except as noted in the HPI and PMH.   Physical Exam Updated Vital Signs BP (!) 142/83   Pulse 67   Temp 97.7 F (36.5 C) (Oral)   Resp (!) 24   SpO2 100%   Physical Exam Vitals and nursing note reviewed.  Constitutional:      General: He is not in acute distress.    Appearance: He is well-developed. He is not diaphoretic.     Comments: Frail appearing  HENT:     Head: Normocephalic and atraumatic.     Comments: No icterus or conjunctival pallor Eyes:      Conjunctiva/sclera: Conjunctivae normal.     Pupils: Pupils are equal, round, and reactive to light.  Cardiovascular:     Rate and Rhythm: Normal rate and regular rhythm.  Pulmonary:     Effort: Pulmonary effort is normal.     Breath sounds: Normal breath sounds.  Abdominal:     Palpations: Abdomen is soft.     Tenderness: There is no abdominal tenderness.  Genitourinary:    Comments: No CVA tenderness to percussion bilaterally. Musculoskeletal:        General: Normal range of motion.     Cervical back: Normal range of motion.     Comments: Strength to lower extremities 5 out of 5 bilaterally.  Patellar reflexes 2+ bilaterally, patient ambulates with a coordinated and nonantalgic gait.  Neurological:     General: No focal deficit present.     Mental Status: He is alert and oriented to person, place, and time.  Psychiatric:        Mood and Affect: Mood normal.     ED Results / Procedures / Treatments   Labs (all labs ordered are listed, but only abnormal results are displayed) Labs Reviewed  COMPREHENSIVE METABOLIC PANEL - Abnormal; Notable for the following components:      Result Value   Total Protein 6.4 (*)    Albumin 3.4 (*)    All other components within normal limits  URINALYSIS, ROUTINE W REFLEX MICROSCOPIC - Abnormal; Notable for the following components:   APPearance CLOUDY (*)    Nitrite POSITIVE (*)    Leukocytes,Ua LARGE (*)    WBC, UA >50 (*)    Bacteria, UA MANY (*)    All other components within normal limits  URINE CULTURE  CBC WITH DIFFERENTIAL/PLATELET    EKG None  Radiology DG Chest Port 1 View  Result Date: 09/14/2019 CLINICAL DATA:  Tachypnea EXAM: PORTABLE CHEST 1 VIEW COMPARISON:  May 20, 2016 FINDINGS: There is slight bibasilar atelectasis. There is an equivocal left pleural effusion. Lungs elsewhere are clear. Heart size and pulmonary vascularity are normal. No adenopathy. There is aortic atherosclerosis. No adenopathy. There are healed  rib fractures on the left, stable. Bones are osteoporotic. IMPRESSION: Mild bibasilar atelectasis with equivocal left pleural effusion. Lungs elsewhere clear. Cardiac silhouette stable and within normal limits. No adenopathy. Bones osteoporotic. Aortic Atherosclerosis (ICD10-I70.0). Electronically Signed   By: Bretta Bang III M.D.   On: 09/14/2019 08:46    Procedures Procedures (including critical care time)  Medications Ordered in ED Medications  cefTRIAXone (ROCEPHIN) injection 1 g (has no administration in time range)    ED Course  I have reviewed the triage vital signs and the nursing notes.  Pertinent labs & imaging results that were  available during my care of the patient were reviewed by me and considered in my medical decision making (see chart for details).  Clinical Course as of Sep 14 903  Tue Sep 14, 2019  0812 Bacteria, UA(!): MANY [NP]    Clinical Course User Index [NP] Kaylyn Lim   MDM Rules/Calculators/A&P                     Vitals:   09/14/19 0804 09/14/19 0815 09/14/19 0830 09/14/19 0845  BP: 139/90 (!) 144/94 136/81 (!) 142/83  Pulse: 70 72 71 67  Resp: (!) 23 (!) 23 (!) 23 (!) 24  Temp:      TempSrc:      SpO2: 99% 98% 99% 100%    Medications  cefTRIAXone (ROCEPHIN) injection 1 g (has no administration in time range)    Ralph Jackson is 74 y.o. male presenting with bilateral lower extremity weakness over the course of the last week.  There is no focal deficit on my exam.  Blood work reassuring, urinalysis is consistent with infection, patient with no signs of pyelonephritis.  He is slightly tachypneic.  He states that he was short of breath but he is not anymore.  Physical exam is not consistent with DVT, I doubt PE.  He saturating well on room air, no tachycardia.  Chest x-ray without acute finding.  Patient will be given a gram of Rocephin for UTI, culture pending.  Will follow closely with PCP.  Discussed case with attending  physician who agrees with care plan and disposition.   Evaluation does not show pathology that would require ongoing emergent intervention or inpatient treatment. Pt is hemodynamically stable and mentating appropriately. Discussed findings and plan with patient/guardian, who agrees with care plan. All questions answered. Return precautions discussed and outpatient follow up given.     Final Clinical Impression(s) / ED Diagnoses Final diagnoses:  Acute cystitis with hematuria    Rx / DC Orders ED Discharge Orders         Ordered    levofloxacin (LEVAQUIN) 750 MG tablet  Daily     09/14/19 0903           Anastacio Bua, Mardella Layman 09/14/19 8882    Benjiman Core, MD 09/14/19 1526

## 2019-09-14 NOTE — Discharge Instructions (Addendum)
Please follow with your primary care doctor in the next 2 days for a check-up. They must obtain records for further management.  ° °Do not hesitate to return to the Emergency Department for any new, worsening or concerning symptoms.  ° °

## 2019-11-20 ENCOUNTER — Ambulatory Visit: Payer: Medicare HMO | Attending: Internal Medicine

## 2019-11-20 DIAGNOSIS — Z23 Encounter for immunization: Secondary | ICD-10-CM

## 2019-11-20 NOTE — Progress Notes (Signed)
   Covid-19 Vaccination Clinic  Name:  Ralph Jackson    MRN: [id number] DOB: [birth year]  11/20/2019  [name] was observed post Covid-19 immunization for 15 minutes without incident. He was provided with Vaccine Information Sheet and instruction to access the V-Safe system.   Mr. Portal was instructed to call 911 with any severe reactions post vaccine: Marland Kitchen Difficulty breathing  . Swelling of face and throat  . A fast heartbeat  . A bad rash all over body  . Dizziness and weakness   Immunizations Administered    Name Date Dose VIS Date Route   Pfizer COVID-19 Vaccine 11/20/2019  9:31 AM 0.3 mL 08/13/2019 Intramuscular   Manufacturer: [employer], [employer]   Lot: [id number]   NDC: [address]-9

## 2019-12-15 ENCOUNTER — Ambulatory Visit: Payer: Medicare HMO | Attending: Internal Medicine

## 2019-12-15 DIAGNOSIS — Z23 Encounter for immunization: Secondary | ICD-10-CM

## 2019-12-15 NOTE — Progress Notes (Signed)
   Covid-19 Vaccination Clinic  Name:  Ralph Jackson    MRN: [id number] DOB: 1945/11/07  12/15/2019  Ralph Jackson was observed post Covid-19 immunization for 15 minutes without incident. He was provided with Vaccine Information Sheet and instruction to access the V-Safe system.   Ralph Jackson was instructed to call 911 with any severe reactions post vaccine: Marland Kitchen Difficulty breathing  . Swelling of face and throat  . A fast heartbeat  . A bad rash all over body  . Dizziness and weakness   Immunizations Administered    Name Date Dose VIS Date Route   Pfizer COVID-19 Vaccine 12/15/2019  9:07 AM 0.3 mL 08/13/2019 Intramuscular   Manufacturer: [employer], [employer]   Lot: [id number]   NDC: [address]-2

## 2020-12-12 ENCOUNTER — Ambulatory Visit
Admission: EM | Admit: 2020-12-12 | Discharge: 2020-12-12 | Disposition: A | Discharge status: Home / Self Care | Payer: Medicare Other

## 2020-12-12 ENCOUNTER — Other Ambulatory Visit: Payer: Self-pay

## 2020-12-12 ENCOUNTER — Ambulatory Visit: Admit: 2020-12-12 | Disposition: A | Discharge status: Home / Self Care | Payer: Medicare HMO

## 2020-12-12 DIAGNOSIS — Z20822 Contact with and (suspected) exposure to covid-19: Secondary | ICD-10-CM

## 2020-12-12 DIAGNOSIS — J069 Acute upper respiratory infection, unspecified: Secondary | ICD-10-CM | POA: Diagnosis not present

## 2020-12-12 DIAGNOSIS — H6123 Impacted cerumen, bilateral: Secondary | ICD-10-CM | POA: Diagnosis not present

## 2020-12-12 MED ORDER — LORATADINE 10 MG PO TABS: 10.0000 mg | ORAL_TABLET | Freq: Every day | ORAL | 0 refills | Status: AC

## 2020-12-12 MED ORDER — BENZONATATE 100 MG PO CAPS: 100.0000 mg | ORAL_CAPSULE | Freq: Three times a day (TID) | ORAL | 0 refills | Status: AC | PRN

## 2020-12-12 NOTE — ED Triage Notes (Signed)
Pt c/o cough, nasal/chest congestion, sore throat, epigastric pain, SOB at times, and headache since last night.

## 2020-12-12 NOTE — Discharge Instructions (Signed)
Covid/flu test pending, monitor MyChart for results Daily Claritin to help with congestion and postnasal drainage Tessalon every 8 hours for cough Tylenol as needed for headaches, fevers, sore throat May use over-the-counter DEBROX to help with wax  Please follow-up if not improving or worsening

## 2020-12-13 NOTE — ED Provider Notes (Signed)
[name]    [name]: [id number] Arrival date & time: 12/12/20  1916      History   Chief Complaint Chief Complaint  Patient presents with  . Cough    Nasal congestion     HPI Ralph Jackson is a 75 y.o. male history of HTN, Alcohol use presenting today for URI symptoms. Reports cough congestion and sore throat beginning yesterday. Unsure of fevers. Denies chest pain or SOB. Denies vomiting, diarrhea, has had slight abdominal discomfort.   HPI  Past Medical History:  Diagnosis Date  . Depression   . ETOH abuse   . Hypertension   . Urinary tract infection     Patient Active Problem List   Diagnosis Date Noted  . Protein-calorie malnutrition, severe ([facility]) 07/08/2014  . TSH elevation 07/08/2014  . FTT (failure to thrive) in adult 07/08/2014  . Alcohol dependence with alcohol-induced mood disorder ([facility])   . Alcohol abuse 07/05/2014  . Alcohol dependence ([facility]) 04/04/2014    History reviewed. No pertinent surgical history.     Home Medications    Prior to Admission medications   Medication Sig Start Date End Date Taking? Authorizing Provider  benzonatate (TESSALON) 100 MG capsule Take 1-2 capsules (100-200 mg total) by mouth 3 (three) times daily as needed for up to 7 days for cough. 12/12/20 12/19/20 Yes Hajime Asfaw C, PA-C  loratadine (CLARITIN) 10 MG tablet Take 1 tablet (10 mg total) by mouth daily. 12/12/20  Yes Emberli Ballester C, PA-C  omeprazole (PRILOSEC) 40 MG capsule Take 40 mg by mouth daily.   Yes [provider]  escitalopram (LEXAPRO) 10 MG tablet Take 10 mg by mouth daily. 11/25/15   [provider]  levothyroxine (SYNTHROID, LEVOTHROID) 25 MCG tablet Take 25 mcg by mouth daily before breakfast.  03/03/15   [provider]  metoprolol tartrate (LOPRESSOR) 25 MG tablet Take 25 mg by mouth 2 (two) times daily.  03/01/15   [provider]  pravastatin (PRAVACHOL) 10 MG tablet Take 10 mg by mouth daily.  11/26/15   [provider]    Family History History reviewed. No pertinent family history.  Social History Social History   Tobacco Use  . Smoking status: Never Smoker  . Smokeless tobacco: Current User  Substance Use Topics  . Alcohol use: Not Currently    Alcohol/week: 6.0 standard drinks    Types: 3 Glasses of wine, 3 Cans of beer per week    Comment: Pt states he drinks 3/5 of wine daily  . Drug use: No     Allergies   Patient has no known allergies.   Review of Systems Review of Systems  Constitutional: Negative for activity change, appetite change, chills, fatigue and fever.  HENT: Positive for congestion, rhinorrhea and sore throat. Negative for ear pain, sinus pressure and trouble swallowing.   Eyes: Negative for discharge and redness.  Respiratory: Positive for cough. Negative for chest tightness and shortness of breath.   Cardiovascular: Negative for chest pain.  Gastrointestinal: Negative for abdominal pain, diarrhea, nausea and vomiting.  Musculoskeletal: Negative for myalgias.  Skin: Negative for rash.  Neurological: Negative for dizziness, light-headedness and headaches.     Physical Exam Triage Vital Signs ED Triage Vitals  Enc Vitals Group     BP 12/12/20 1922 (!) 164/88     Pulse Rate 12/12/20 1922 67     Resp 12/12/20 1922 18     Temp 12/12/20 1922 99.4 F (37.4 C)  Temp Source 12/12/20 1922 Oral     SpO2 12/12/20 1922 96 %     Weight --      Height --      Head Circumference --      Peak Flow --      Pain Score 12/12/20 1926 0     Pain Loc --      Pain Edu? --      Excl. in GC? --    No data found.  Updated Vital Signs BP (!) 164/88 (BP Location: Left Arm)   Pulse 67   Temp 99.4 F (37.4 C) (Oral)   Resp 18   SpO2 96%   Visual Acuity Right Eye Distance:   Left Eye Distance:   Bilateral Distance:    Right Eye Near:   Left Eye Near:    Bilateral Near:     Physical Exam Vitals and nursing note reviewed.   Constitutional:      Appearance: He is well-developed.     Comments: No acute distress  HENT:     Head: Normocephalic and atraumatic.     Ears:     Comments: cermuen impaction bilaterally    Nose: Nose normal.     Mouth/Throat:     Comments: Oral mucosa pink and moist, no tonsillar enlargement or exudate. Posterior pharynx patent and nonerythematous, no uvula deviation or swelling. Normal phonation. Eyes:     Conjunctiva/sclera: Conjunctivae normal.  Cardiovascular:     Rate and Rhythm: Normal rate.  Pulmonary:     Effort: Pulmonary effort is normal. No respiratory distress.     Comments: Breathing comfortably at rest, CTABL, no wheezing, rales or other adventitious sounds auscultated  Abdominal:     General: There is no distension.  Musculoskeletal:        General: Normal range of motion.     Cervical back: Neck supple.  Skin:    General: Skin is warm and dry.  Neurological:     Mental Status: He is alert and oriented to person, place, and time.      UC Treatments / Results  Labs (all labs ordered are listed, but only abnormal results are displayed) Labs Reviewed  COVID-19, FLU A+B NAA    EKG   Radiology No results found.  Procedures Procedures (including critical care time)  Medications Ordered in UC Medications - No data to display  Initial Impression / Assessment and Plan / UC Course  I have reviewed the triage vital signs and the nursing notes.  Pertinent labs & imaging results that were available during my care of the patient were reviewed by me and considered in my medical decision making (see chart for details).     Viral URI with cough- exam reassuring, likely viral, recommending symptomatic and supportive care. ceruman impaction removal by irrigation from nursing staff.   Discussed strict return precautions. Patient verbalized understanding and is agreeable with plan.  Final Clinical Impressions(s) / UC Diagnoses   Final diagnoses:   Encounter for screening laboratory testing for COVID-19 virus  Viral URI with cough     Discharge Instructions     Covid/flu test pending, monitor MyChart for results Daily Claritin to help with congestion and postnasal drainage Tessalon every 8 hours for cough Tylenol as needed for headaches, fevers, sore throat May use over-the-counter DEBROX to help with wax  Please follow-up if not improving or worsening   ED Prescriptions    Medication Sig Dispense Auth. Provider   benzonatate (TESSALON) 100 MG capsule  Take 1-2 capsules (100-200 mg total) by mouth 3 (three) times daily as needed for up to 7 days for cough. 28 capsule Shaneeka Scarboro C, PA-C   loratadine (CLARITIN) 10 MG tablet Take 1 tablet (10 mg total) by mouth daily. 15 tablet Michaelene Dutan, [place] C, PA-C     PDMP not reviewed this encounter.   Sharyon Cable Bland C, PA-C 12/13/20 1005

## 2020-12-14 LAB — COVID-19, FLU A+B NAA
Influenza A, NAA: NOT DETECTED
Influenza B, NAA: NOT DETECTED
SARS-CoV-2, NAA: NOT DETECTED

## 2021-06-19 ENCOUNTER — Emergency Department (HOSPITAL_COMMUNITY): Payer: Medicare Other

## 2021-06-19 ENCOUNTER — Encounter (HOSPITAL_COMMUNITY): Payer: Self-pay

## 2021-06-19 ENCOUNTER — Inpatient Hospital Stay (HOSPITAL_COMMUNITY)
Admission: EM | Admit: 2021-06-19 | Discharge: 2021-06-28 | DRG: 521 | Disposition: A | Discharge status: Home Health | Payer: Medicare Other | Attending: Internal Medicine | Admitting: Internal Medicine

## 2021-06-19 ENCOUNTER — Other Ambulatory Visit: Payer: Self-pay

## 2021-06-19 DIAGNOSIS — F1097 Alcohol use, unspecified with alcohol-induced persisting dementia: Secondary | ICD-10-CM | POA: Diagnosis not present

## 2021-06-19 DIAGNOSIS — Z793 Long term (current) use of hormonal contraceptives: Secondary | ICD-10-CM | POA: Diagnosis not present

## 2021-06-19 DIAGNOSIS — E860 Dehydration: Secondary | ICD-10-CM | POA: Diagnosis present

## 2021-06-19 DIAGNOSIS — F1011 Alcohol abuse, in remission: Secondary | ICD-10-CM | POA: Diagnosis present

## 2021-06-19 DIAGNOSIS — D649 Anemia, unspecified: Secondary | ICD-10-CM | POA: Diagnosis present

## 2021-06-19 DIAGNOSIS — S72041A Displaced fracture of base of neck of right femur, initial encounter for closed fracture: Secondary | ICD-10-CM

## 2021-06-19 DIAGNOSIS — Z20822 Contact with and (suspected) exposure to covid-19: Secondary | ICD-10-CM | POA: Diagnosis present

## 2021-06-19 DIAGNOSIS — J811 Chronic pulmonary edema: Secondary | ICD-10-CM

## 2021-06-19 DIAGNOSIS — J9601 Acute respiratory failure with hypoxia: Secondary | ICD-10-CM | POA: Diagnosis not present

## 2021-06-19 DIAGNOSIS — R54 Age-related physical debility: Secondary | ICD-10-CM | POA: Diagnosis present

## 2021-06-19 DIAGNOSIS — I5021 Acute systolic (congestive) heart failure: Secondary | ICD-10-CM | POA: Diagnosis not present

## 2021-06-19 DIAGNOSIS — Z681 Body mass index (BMI) 19 or less, adult: Secondary | ICD-10-CM | POA: Diagnosis not present

## 2021-06-19 DIAGNOSIS — I110 Hypertensive heart disease with heart failure: Secondary | ICD-10-CM | POA: Diagnosis present

## 2021-06-19 DIAGNOSIS — Z79899 Other long term (current) drug therapy: Secondary | ICD-10-CM | POA: Diagnosis not present

## 2021-06-19 DIAGNOSIS — E8809 Other disorders of plasma-protein metabolism, not elsewhere classified: Secondary | ICD-10-CM | POA: Diagnosis present

## 2021-06-19 DIAGNOSIS — I082 Rheumatic disorders of both aortic and tricuspid valves: Secondary | ICD-10-CM | POA: Diagnosis present

## 2021-06-19 DIAGNOSIS — S72001A Fracture of unspecified part of neck of right femur, initial encounter for closed fracture: Secondary | ICD-10-CM | POA: Diagnosis present

## 2021-06-19 DIAGNOSIS — H9190 Unspecified hearing loss, unspecified ear: Secondary | ICD-10-CM | POA: Diagnosis present

## 2021-06-19 DIAGNOSIS — E43 Unspecified severe protein-calorie malnutrition: Secondary | ICD-10-CM | POA: Diagnosis present

## 2021-06-19 DIAGNOSIS — I5043 Acute on chronic combined systolic (congestive) and diastolic (congestive) heart failure: Secondary | ICD-10-CM | POA: Diagnosis not present

## 2021-06-19 DIAGNOSIS — I10 Essential (primary) hypertension: Secondary | ICD-10-CM | POA: Diagnosis present

## 2021-06-19 DIAGNOSIS — S72011A Unspecified intracapsular fracture of right femur, initial encounter for closed fracture: Secondary | ICD-10-CM | POA: Diagnosis present

## 2021-06-19 DIAGNOSIS — R64 Cachexia: Secondary | ICD-10-CM | POA: Diagnosis present

## 2021-06-19 DIAGNOSIS — W109XXA Fall (on) (from) unspecified stairs and steps, initial encounter: Secondary | ICD-10-CM | POA: Diagnosis present

## 2021-06-19 DIAGNOSIS — F17290 Nicotine dependence, other tobacco product, uncomplicated: Secondary | ICD-10-CM | POA: Diagnosis present

## 2021-06-19 DIAGNOSIS — F1027 Alcohol dependence with alcohol-induced persisting dementia: Secondary | ICD-10-CM | POA: Diagnosis present

## 2021-06-19 DIAGNOSIS — F03A0 Unspecified dementia, mild, without behavioral disturbance, psychotic disturbance, mood disturbance, and anxiety: Secondary | ICD-10-CM | POA: Diagnosis present

## 2021-06-19 DIAGNOSIS — Z9889 Other specified postprocedural states: Secondary | ICD-10-CM

## 2021-06-19 LAB — TYPE AND SCREEN
ABO/RH(D): A POS
Antibody Screen: NEGATIVE

## 2021-06-19 LAB — BASIC METABOLIC PANEL
Anion gap: 11 (ref 5–15)
BUN: 18 mg/dL (ref 8–23)
CO2: 25 mmol/L (ref 22–32)
Calcium: 8.8 mg/dL — ABNORMAL LOW (ref 8.9–10.3)
Chloride: 102 mmol/L (ref 98–111)
Creatinine, Ser: 1 mg/dL (ref 0.61–1.24)
GFR, Estimated: >60 mL/min (ref >60)
Glucose, Bld: 133 mg/dL — ABNORMAL HIGH (ref 70–99)
Potassium: 3.8 mmol/L (ref 3.5–5.1)
Sodium: 138 mmol/L (ref 135–145)

## 2021-06-19 LAB — CBC WITH DIFFERENTIAL/PLATELET
Abs Immature Granulocytes: 0.07 10*3/uL (ref 0.00–0.07)
Basophils Absolute: 0 10*3/uL (ref 0.0–0.1)
Basophils Relative: 0 %
Eosinophils Absolute: 0 10*3/uL (ref 0.0–0.5)
Eosinophils Relative: 0 %
HCT: 38.9 % — ABNORMAL LOW (ref 39.0–52.0)
Hemoglobin: 12.8 g/dL — ABNORMAL LOW (ref 13.0–17.0)
Immature Granulocytes: 0 %
Lymphocytes Relative: 8 %
Lymphs Abs: 1.3 10*3/uL (ref 0.7–4.0)
MCH: 30.6 pg (ref 26.0–34.0)
MCHC: 32.9 g/dL (ref 30.0–36.0)
MCV: 93.1 fL (ref 80.0–100.0)
Monocytes Absolute: 1.3 10*3/uL — ABNORMAL HIGH (ref 0.1–1.0)
Monocytes Relative: 8 %
Neutro Abs: 14 10*3/uL — ABNORMAL HIGH (ref 1.7–7.7)
Neutrophils Relative %: 84 %
Platelets: 266 10*3/uL (ref 150–400)
RBC: 4.18 MIL/uL — ABNORMAL LOW (ref 4.22–5.81)
RDW: 13.5 % (ref 11.5–15.5)
WBC: 16.8 10*3/uL — ABNORMAL HIGH (ref 4.0–10.5)
nRBC: 0 % (ref 0.0–0.2)

## 2021-06-19 LAB — PROTIME-INR
INR: 0.9 (ref 0.8–1.2)
Prothrombin Time: 12.4 seconds (ref 11.4–15.2)

## 2021-06-19 LAB — ETHANOL: Alcohol, Ethyl (B): <10 mg/dL (ref <10)

## 2021-06-19 LAB — TSH: TSH: 3.312 u[IU]/mL (ref 0.350–4.500)

## 2021-06-19 IMAGING — DX DG CHEST 1V PORT
1 series · 1 of 1 positions shown · non-contrast
Comparison: [DATE]

CLINICAL DATA: Fall

EXAM:
PORTABLE CHEST 1 VIEW

[chest]
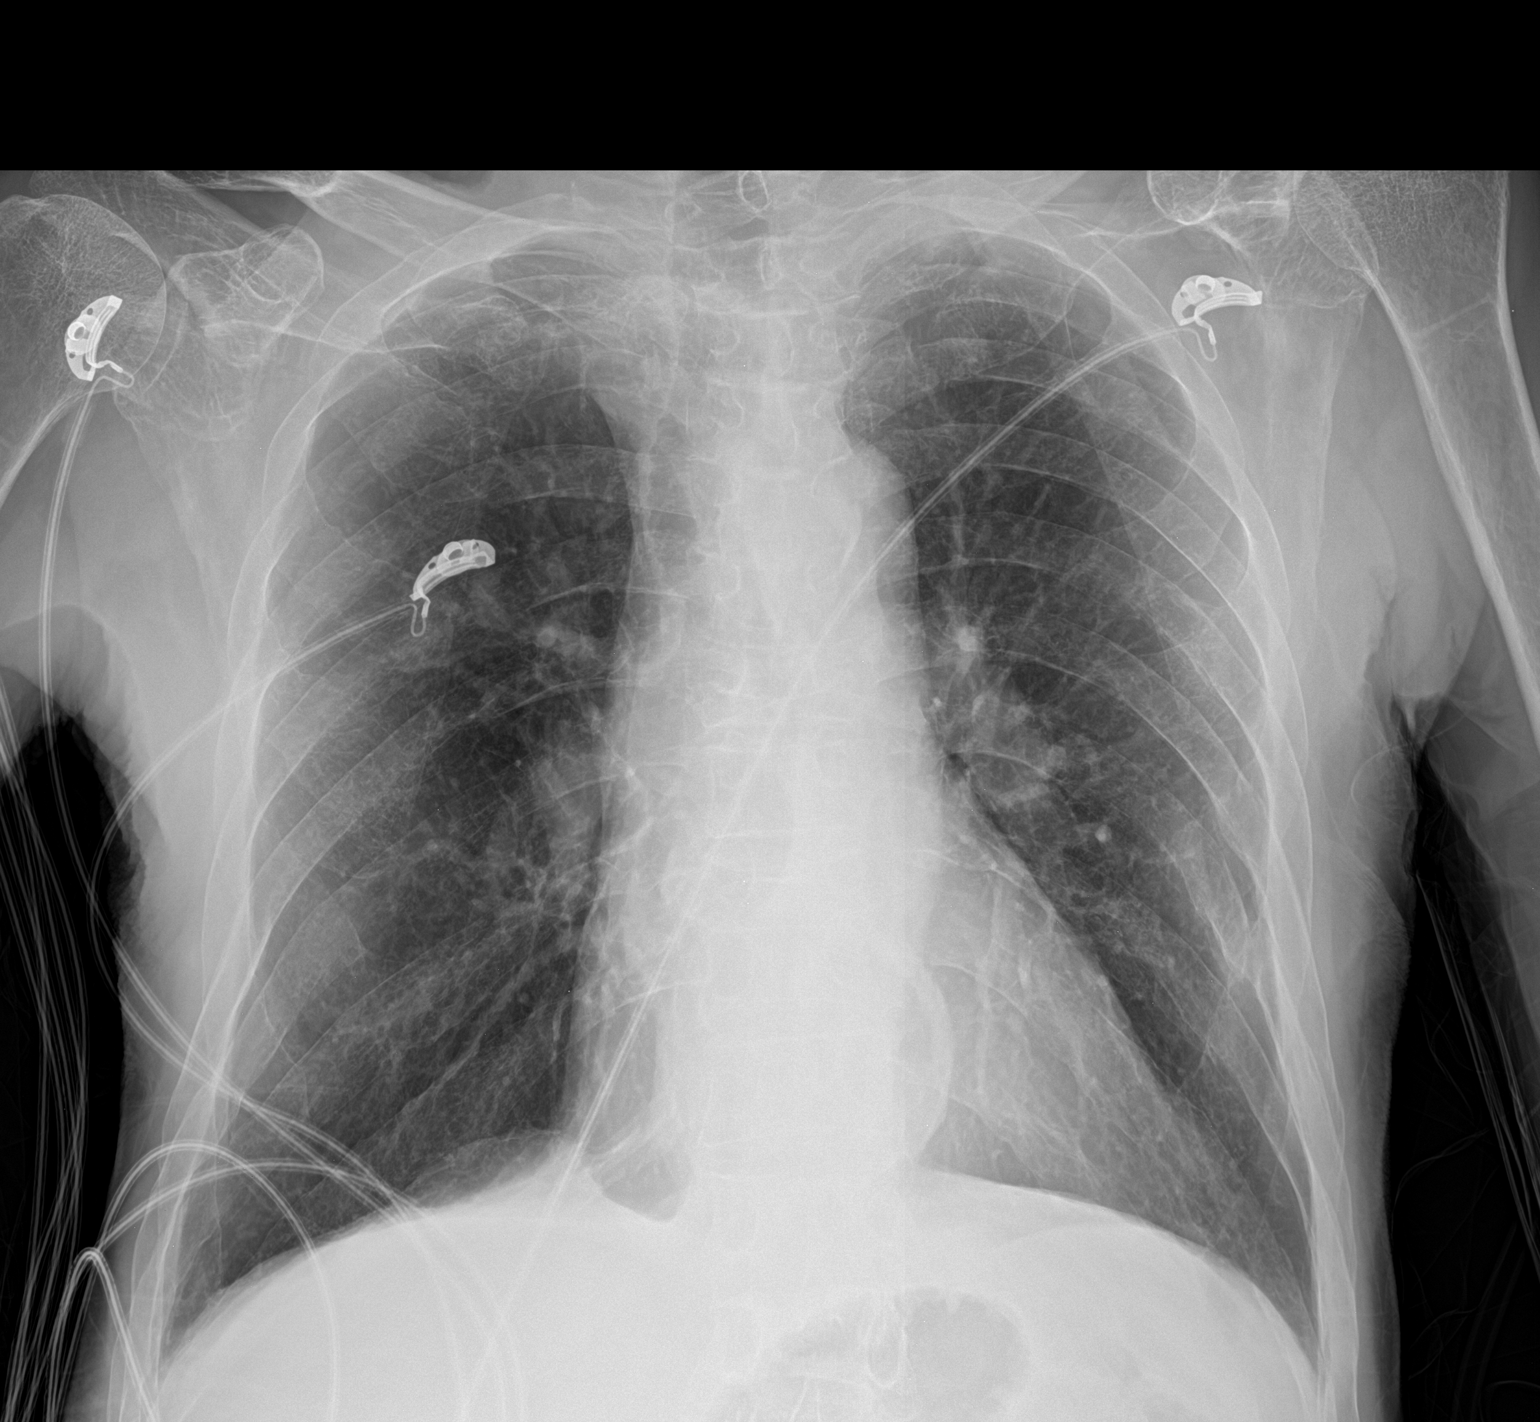

[1 of 1 positions shown; findings below may reference images not displayed]

FINDINGS: Lungs are clear.  No pleural effusion or pneumothorax.

The heart is normal in size.

Moderate hiatal hernia.

Old left rib fracture deformities.
IMPRESSION: No evidence of acute cardiopulmonary disease.

## 2021-06-19 IMAGING — CR DG HIP (WITH OR WITHOUT PELVIS) 2-3V*R*
3 series · 3 of 3 positions shown · non-contrast
Comparison: None.

CLINICAL DATA: Fall, pain.

EXAM:
DG HIP (WITH OR WITHOUT PELVIS) 2-3V RIGHT

[pelvis ap]
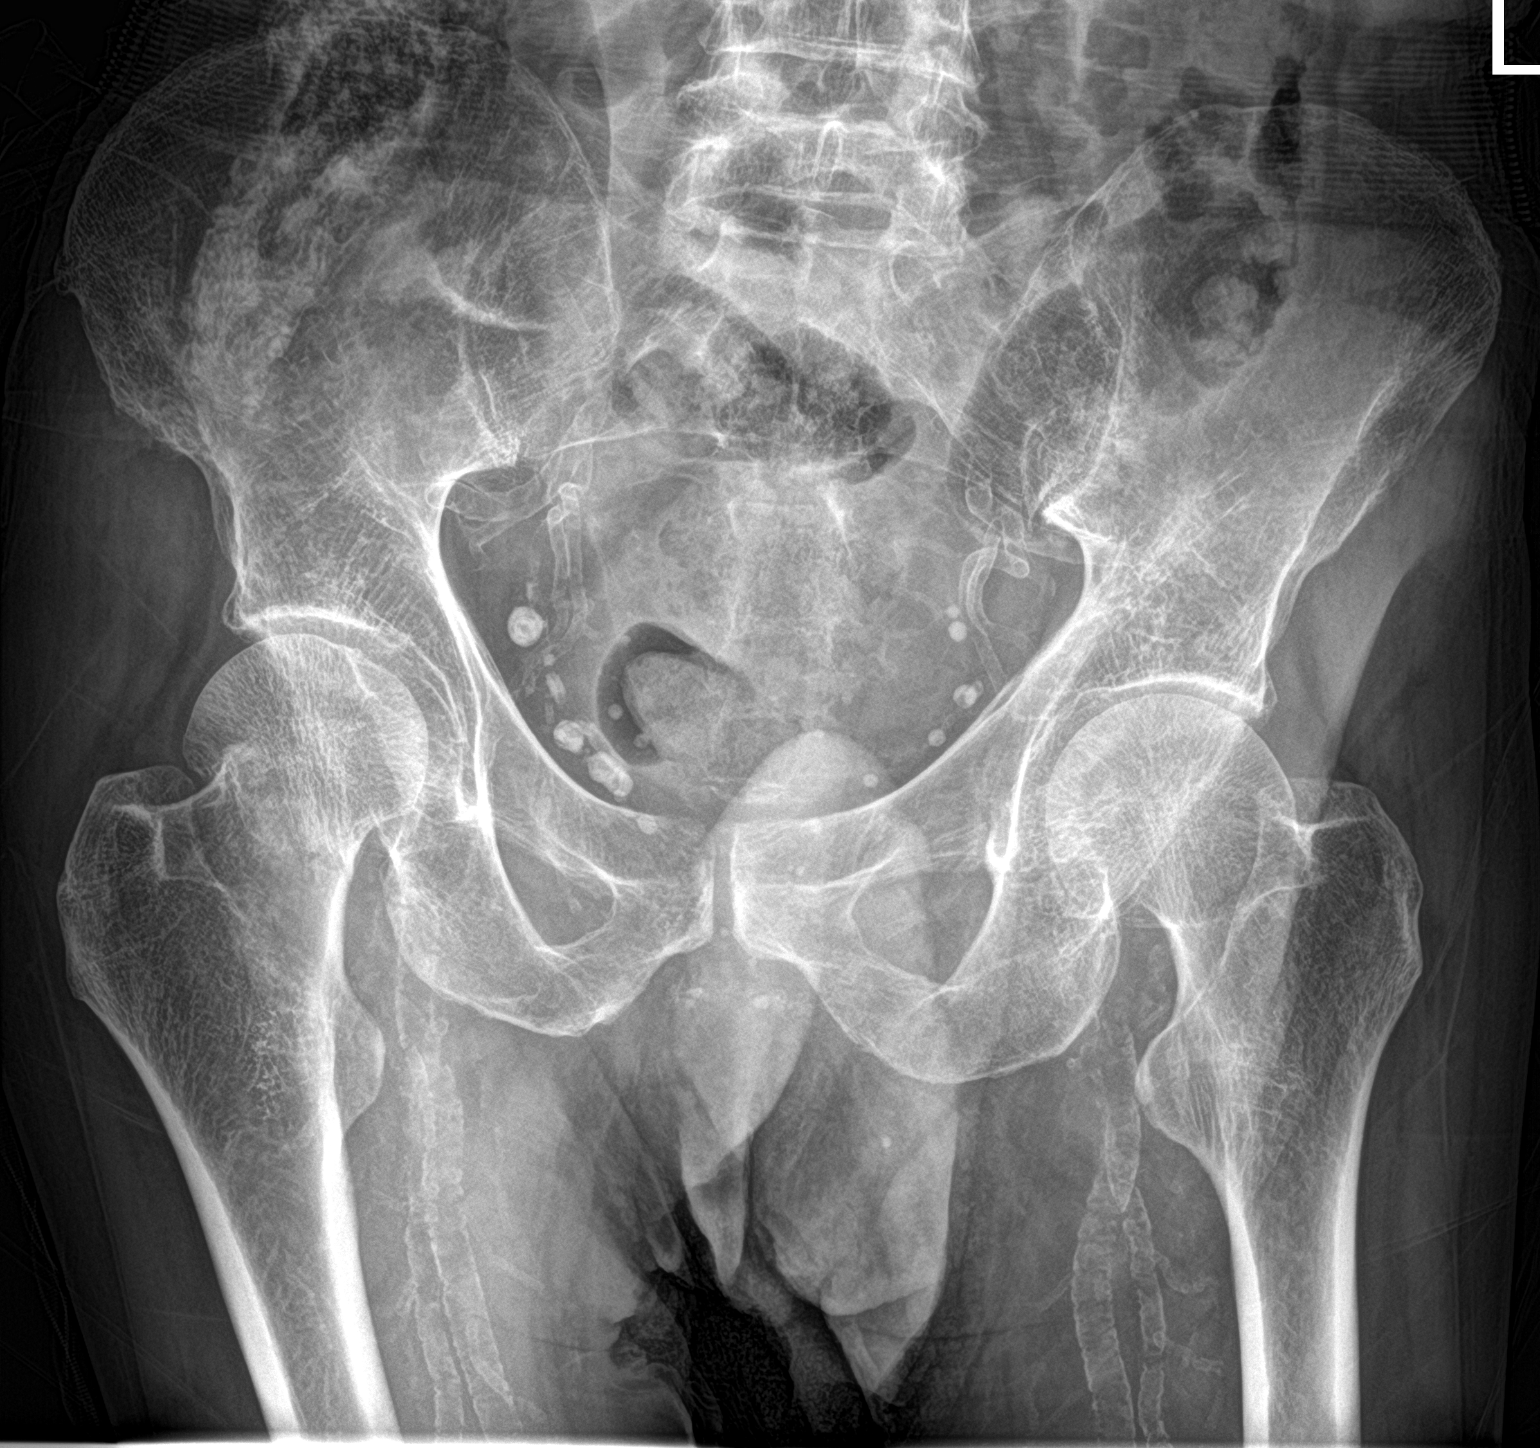

[hip ap]
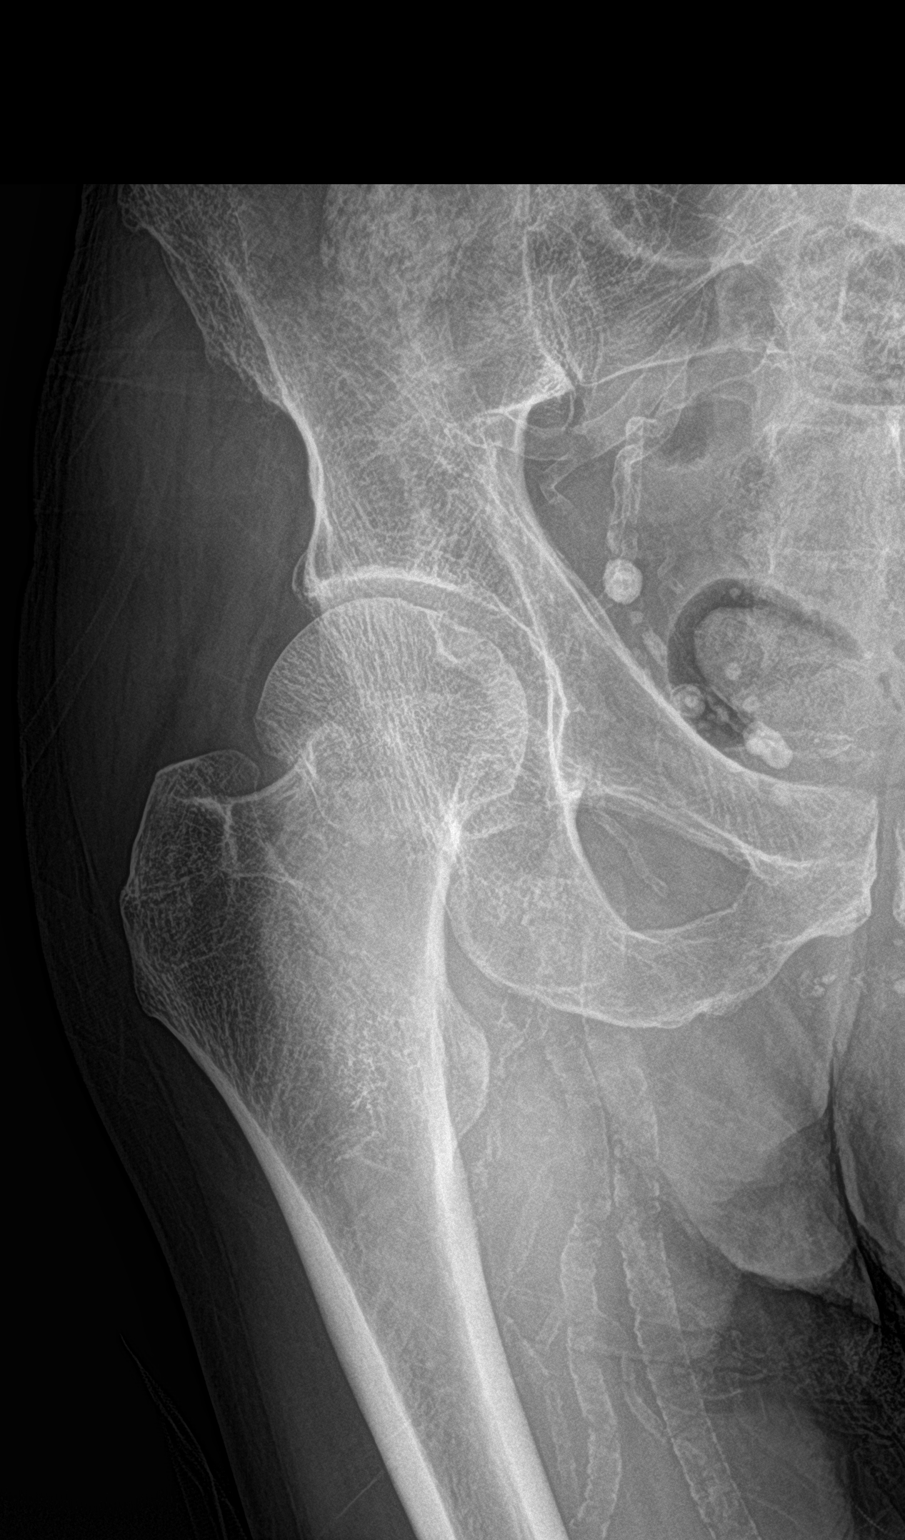

[hip lat]
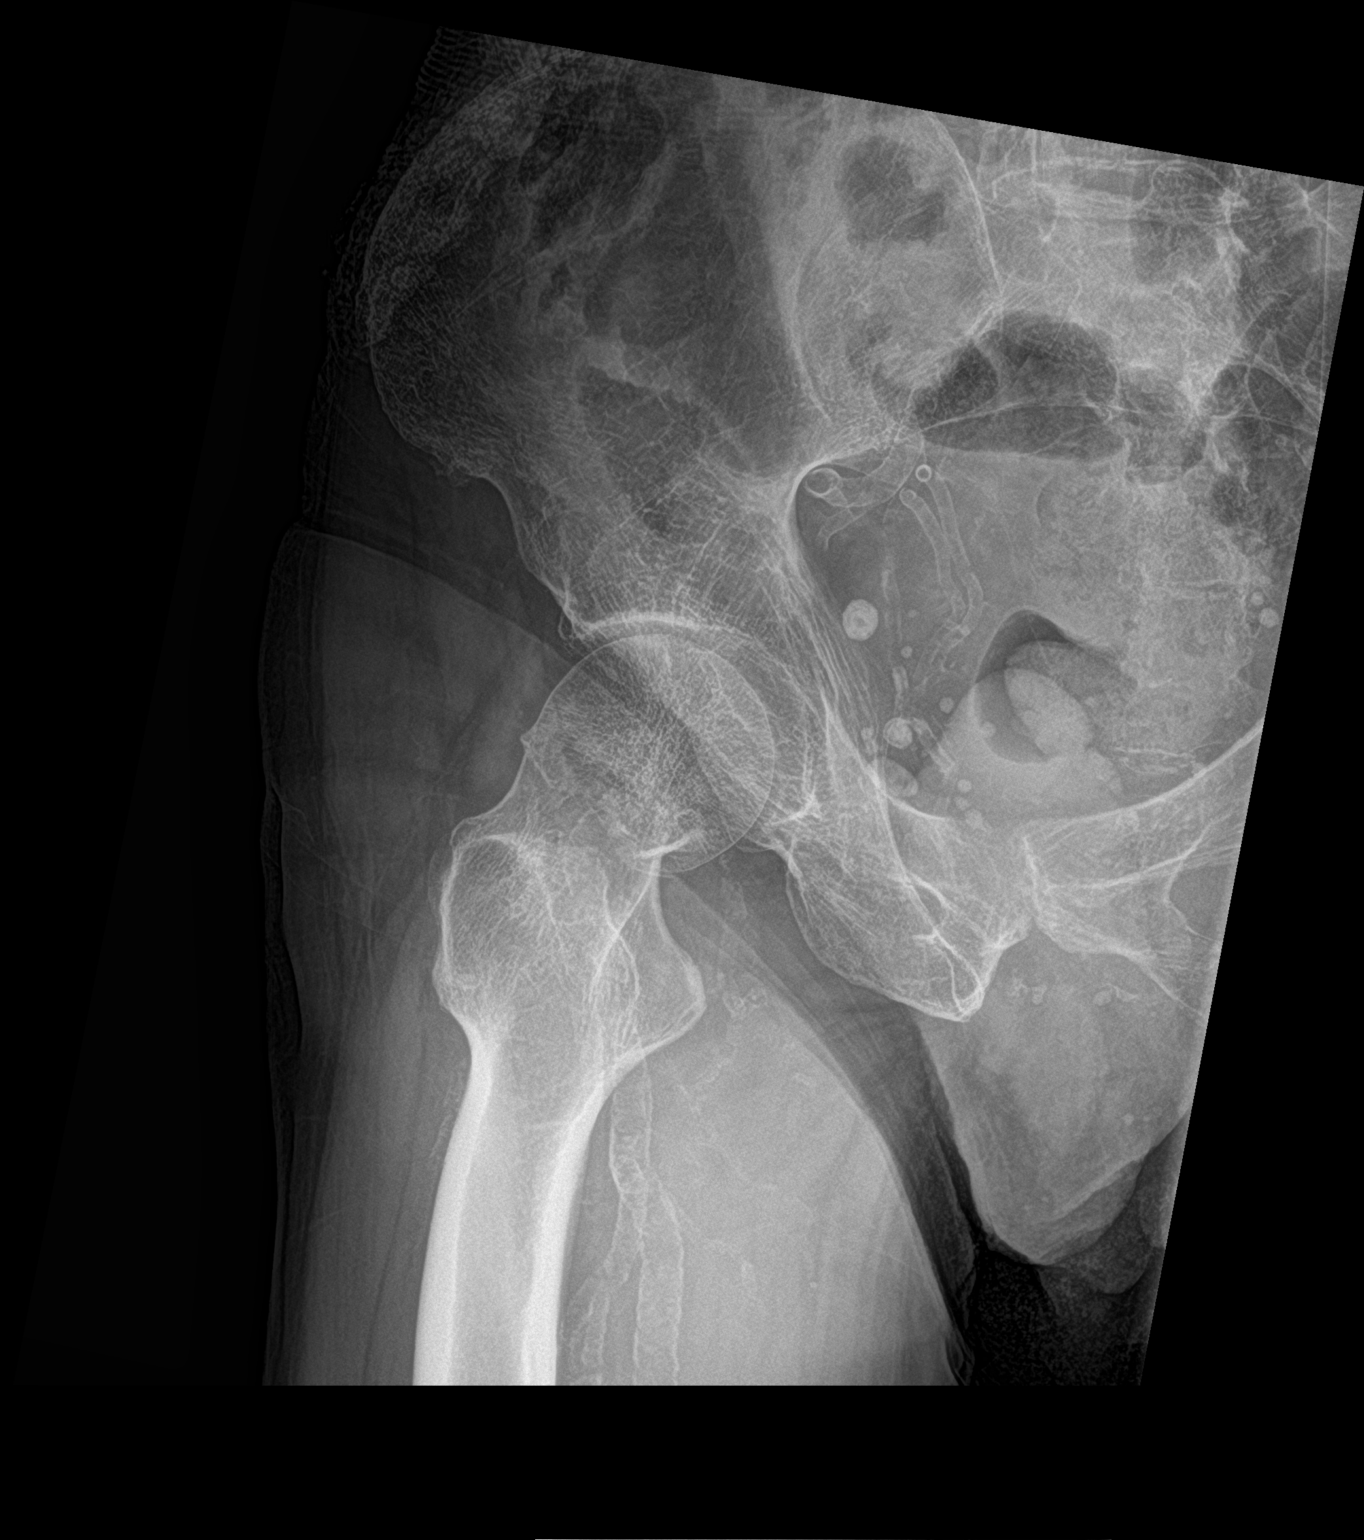

[3 of 3 positions shown; findings below may reference images not displayed]

FINDINGS: The bones are osteopenic. There is a subcapital right femoral neck
fracture, mildly impacted. There is no dislocation. Joint spaces are
maintained. Prominent vascular calcifications are seen in the soft
tissues.
IMPRESSION: 1. Subcapital right femoral neck fracture.

## 2021-06-19 MED ORDER — PRAVASTATIN SODIUM 20 MG PO TABS
10.0000 mg | ORAL_TABLET | Freq: Every day | ORAL | Status: DC
  Administered 2021-06-20 – 2021-06-27 (×8): 10 mg via ORAL
  Filled 2021-06-19 (×8): qty 1

## 2021-06-19 MED ORDER — ONDANSETRON HCL 4 MG/2ML IJ SOLN
4.0000 mg | Freq: Once | INTRAMUSCULAR | Status: AC
  Administered 2021-06-19: 4 mg via INTRAVENOUS
  Filled 2021-06-19: qty 2

## 2021-06-19 MED ORDER — PANTOPRAZOLE SODIUM 40 MG PO TBEC
40.0000 mg | DELAYED_RELEASE_TABLET | Freq: Every day | ORAL | Status: DC
  Administered 2021-06-20 – 2021-06-28 (×9): 40 mg via ORAL
  Filled 2021-06-19 (×9): qty 1

## 2021-06-19 MED ORDER — LORATADINE 10 MG PO TABS
10.0000 mg | ORAL_TABLET | Freq: Every day | ORAL | Status: DC
  Administered 2021-06-20 – 2021-06-28 (×8): 10 mg via ORAL
  Filled 2021-06-19 (×9): qty 1

## 2021-06-19 MED ORDER — LEVOTHYROXINE SODIUM 50 MCG PO TABS
50.0000 ug | ORAL_TABLET | Freq: Every day | ORAL | Status: DC
  Administered 2021-06-20 – 2021-06-28 (×8): 50 ug via ORAL
  Filled 2021-06-19 (×8): qty 1

## 2021-06-19 MED ORDER — SODIUM CHLORIDE 0.9 % IV BOLUS
1000.0000 mL | Freq: Once | INTRAVENOUS | Status: AC
  Administered 2021-06-19: 1000 mL via INTRAVENOUS

## 2021-06-19 MED ORDER — MORPHINE SULFATE (PF) 4 MG/ML IV SOLN
4.0000 mg | INTRAVENOUS | Status: AC | PRN
  Administered 2021-06-19 (×2): 4 mg via INTRAVENOUS
  Filled 2021-06-19 (×2): qty 1

## 2021-06-19 MED ORDER — MORPHINE SULFATE (PF) 2 MG/ML IV SOLN
0.5000 mg | INTRAVENOUS | Status: DC | PRN
  Administered 2021-06-20 – 2021-06-21 (×5): 0.5 mg via INTRAVENOUS
  Filled 2021-06-19 (×5): qty 1

## 2021-06-19 MED ORDER — ESCITALOPRAM OXALATE 20 MG PO TABS
20.0000 mg | ORAL_TABLET | Freq: Every day | ORAL | Status: DC
  Administered 2021-06-20 – 2021-06-28 (×9): 20 mg via ORAL
  Filled 2021-06-19 (×9): qty 1

## 2021-06-19 MED ORDER — ADULT MULTIVITAMIN W/MINERALS CH
1.0000 | ORAL_TABLET | Freq: Every day | ORAL | Status: DC
  Administered 2021-06-20 – 2021-06-28 (×8): 1 via ORAL
  Filled 2021-06-19 (×9): qty 1

## 2021-06-19 MED ORDER — HYDROCODONE-ACETAMINOPHEN 5-325 MG PO TABS
1.0000 | ORAL_TABLET | Freq: Four times a day (QID) | ORAL | Status: DC | PRN
  Administered 2021-06-20 (×3): 2 via ORAL
  Administered 2021-06-22 – 2021-06-25 (×4): 1 via ORAL
  Administered 2021-06-25 (×2): 2 via ORAL
  Administered 2021-06-26 (×3): 1 via ORAL
  Administered 2021-06-27: 2 via ORAL
  Filled 2021-06-19: qty 2
  Filled 2021-06-19 (×2): qty 1
  Filled 2021-06-19: qty 2
  Filled 2021-06-19: qty 1
  Filled 2021-06-19 (×3): qty 2
  Filled 2021-06-19 (×4): qty 1
  Filled 2021-06-19: qty 2
  Filled 2021-06-19: qty 1

## 2021-06-19 MED ORDER — METOPROLOL TARTRATE 25 MG PO TABS
25.0000 mg | ORAL_TABLET | Freq: Two times a day (BID) | ORAL | Status: DC
  Administered 2021-06-20 – 2021-06-23 (×5): 25 mg via ORAL
  Filled 2021-06-19 (×6): qty 1

## 2021-06-19 NOTE — ED Triage Notes (Signed)
Pt fell down about 5 stairs, no LOC, did not hit his head. Carrying boxes and could not see the steps. C.o right hip pain, no shortening or rotation noted.    2/10 pain Bp 156/82 97% room air  HR 60s Cbg 142

## 2021-06-19 NOTE — H&P (Signed)
History and Physical    Ralph Jackson WGN:[id number] DOB: 08-25-1946 DOA: 06/19/2021  PCP: [name], PA-C  Patient coming from: Home  I have personally briefly reviewed patient's old medical records in [facility]  Chief Complaint: Fall, hip pain  HPI: Ralph Jackson is a 75 y.o. male with medical history significant of prior EtOH abuse, in remission for several years now.  Pt lives at home with daughter.  Pt was taking out trash, carying boxes and couldn't see steps.  Missed step, fell down about 5 stairs, landed on R hip.  Severe R hip pain after fall, unable to ambulate or bear wt.  Symptoms constant, worse with movement.  Pt in to ED.   ED Course: R femoral neck fx.  Pt reports no recent CP, SOB, DOE, known cardiac disease.  Normally ambulates unassisted.   Review of Systems: As per HPI, otherwise all review of systems negative.  Past Medical History:  Diagnosis Date   Depression    ETOH abuse    In remission for several years now   Hypertension    Urinary tract infection     History reviewed. No pertinent surgical history.   reports that he has never smoked. He uses smokeless tobacco. He reports that he does not currently use alcohol after a past usage of about 6.0 standard drinks per week. He reports that he does not use drugs.  No Known Allergies  Family History  Problem Relation Age of Onset   Osteoporosis Neg Hx      Prior to Admission medications   Medication Sig Start Date End Date Taking? Authorizing Provider  escitalopram (LEXAPRO) 20 MG tablet Take 20 mg by mouth daily. 05/19/21  Yes [provider]  levothyroxine (SYNTHROID) 50 MCG tablet Take 50 mcg by mouth daily. 05/21/21  Yes [provider]  loratadine (CLARITIN) 10 MG tablet Take 1 tablet (10 mg total) by mouth daily. 12/12/20  Yes Wieters, Hallie C, PA-C  metoprolol tartrate (LOPRESSOR) 25 MG tablet Take 25 mg by mouth 2 (two) times daily.  03/01/15  Yes  [provider]  Multiple Vitamins-Minerals (ONE-A-DAY MENS 50+) TABS Take 1 tablet by mouth daily.   Yes [provider]  omeprazole (PRILOSEC) 20 MG capsule Take 20 mg by mouth daily. 05/21/21  Yes [provider]  pravastatin (PRAVACHOL) 10 MG tablet Take 10 mg by mouth at bedtime. 11/26/15  Yes [provider]  PROLIA 60 MG/ML SOSY injection Inject 60 mg into the skin See admin instructions. Every 6 months 06/12/21  Yes [provider]    Physical Exam: Vitals:   06/19/21 1827 06/19/21 1952 06/19/21 2230  BP: (!) 153/75 (!) 95/53 113/81  Pulse: (!) 57 60 88  Resp: 16 17 20   Temp: 98.1 F (36.7 C)    TempSrc: Oral    SpO2: 98% 100% 98%    Constitutional: NAD, calm, comfortable, thin, frail Eyes: PERRL, lids and conjunctivae normal ENMT: Mucous membranes are moist. Posterior pharynx clear of any exudate or lesions.Normal dentition.  Neck: normal, supple, no masses, no thyromegaly Respiratory: clear to auscultation bilaterally, no wheezing, no crackles. Normal respiratory effort. No accessory muscle use.  Cardiovascular: Regular rate and rhythm, no murmurs / rubs / gallops. No extremity edema. 2+ pedal pulses. No carotid bruits.  Abdomen: no tenderness, no masses palpated. No hepatosplenomegaly. Bowel sounds positive.  Musculoskeletal: R hip TTP Skin: no rashes, lesions, ulcers. No induration Neurologic: CN 2-12 grossly intact. Sensation intact, DTR  normal. Strength 5/5 in all 4.  Psychiatric: Normal judgment and insight. Alert and oriented x 3. Normal mood.    Labs on Admission: I have personally reviewed following labs and imaging studies  CBC: Recent Labs  Lab 06/19/21 2205  WBC 16.8*  NEUTROABS 14.0*  HGB 12.8*  HCT 38.9*  MCV 93.1  PLT 266   Basic Metabolic Panel: Recent Labs  Lab 06/19/21 2205  NA 138  K 3.8  CL 102  CO2 25  GLUCOSE 133*  BUN 18  CREATININE 1.00  CALCIUM 8.8*   GFR: CrCl cannot be calculated  (Unknown ideal weight.). Liver Function Tests: No results for input(s): AST, ALT, ALKPHOS, BILITOT, PROT, ALBUMIN in the last 168 hours. No results for input(s): LIPASE, AMYLASE in the last 168 hours. No results for input(s): AMMONIA in the last 168 hours. Coagulation Profile: Recent Labs  Lab 06/19/21 2205  INR 0.9   Cardiac Enzymes: No results for input(s): CKTOTAL, CKMB, CKMBINDEX, TROPONINI in the last 168 hours. BNP (last 3 results) No results for input(s): PROBNP in the last 8760 hours. HbA1C: No results for input(s): HGBA1C in the last 72 hours. CBG: No results for input(s): GLUCAP in the last 168 hours. Lipid Profile: No results for input(s): CHOL, HDL, LDLCALC, TRIG, CHOLHDL, LDLDIRECT in the last 72 hours. Thyroid Function Tests: Recent Labs    06/19/21 2205  TSH 3.312   Anemia Panel: No results for input(s): VITAMINB12, FOLATE, FERRITIN, TIBC, IRON, RETICCTPCT in the last 72 hours. Urine analysis:    Component Value Date/Time   COLORURINE YELLOW 09/13/2019 1956   APPEARANCEUR CLOUDY (A) 09/13/2019 1956   APPEARANCEUR Hazy 01/07/2014 1356   LABSPEC 1.014 09/13/2019 1956   LABSPEC 1.027 01/07/2014 1356   PHURINE 5.0 09/13/2019 1956   GLUCOSEU NEGATIVE 09/13/2019 1956   GLUCOSEU Negative 01/07/2014 1356   HGBUR NEGATIVE 09/13/2019 1956   BILIRUBINUR NEGATIVE 09/13/2019 1956   BILIRUBINUR Negative 01/07/2014 1356   KETONESUR NEGATIVE 09/13/2019 1956   PROTEINUR NEGATIVE 09/13/2019 1956   UROBILINOGEN 1.0 07/08/2014 0143   NITRITE POSITIVE (A) 09/13/2019 1956   LEUKOCYTESUR LARGE (A) 09/13/2019 1956   LEUKOCYTESUR Negative 01/07/2014 1356    Radiological Exams on Admission: DG Chest Port 1 View  Result Date: 06/19/2021 CLINICAL DATA:  Fall EXAM: PORTABLE CHEST 1 VIEW COMPARISON:  09/14/2019 FINDINGS: Lungs are clear.  No pleural effusion or pneumothorax. The heart is normal in size. Moderate hiatal hernia. Old left rib fracture deformities. IMPRESSION: No  evidence of acute cardiopulmonary disease. Electronically Signed   By: Charline Bills M.D.   On: 06/19/2021 22:06   DG Hip Unilat W or Wo Pelvis 2-3 Views Right  Result Date: 06/19/2021 CLINICAL DATA:  Fall, pain. EXAM: DG HIP (WITH OR WITHOUT PELVIS) 2-3V RIGHT COMPARISON:  None. FINDINGS: The bones are osteopenic. There is a subcapital right femoral neck fracture, mildly impacted. There is no dislocation. Joint spaces are maintained. Prominent vascular calcifications are seen in the soft tissues. IMPRESSION: 1. Subcapital right femoral neck fracture. Electronically Signed   By: Darliss Cheney M.D.   On: 06/19/2021 18:50    EKG: Independently reviewed.  Assessment/Plan Principal Problem:   Closed displaced fracture of right femoral neck (HCC) Active Problems:   Alcohol abuse, in remission   Alcoholic dementia (HCC)   HTN (hypertension)    R femoral neck fx - Hip fx pathway Pain control (including IV narcotics) per pathway Per Dr. Aundria Rud (ortho) NPO after MN Admit patient to [facility] Likely to OR  tomorrow (10/19) with Dr. Linna Caprice Risk stratification: CXR neg EKG unchanged compared to 2015 No cardiac symptoms at baseline, still active, can go up flight of steps without CP or SOB. GUPTA score = 0.2% Prior EtOH abuse - Sober for several years now per patient and daughter Does have mild EtOH dementia after 50 years of heavy drinking Checking LFTs Although: INR normal today Platelets normal LFTs looked normal when last checked in Jan 2021 HTN - Cont metoprolol  DVT prophylaxis: SCDs Code Status: Full Family Communication: Spoke with daughter on phone: [phone] call with all changes in status, questions, and info. Disposition Plan: TBD, Needs CIR probably but daughter concerned about him becoming depressed, discuss after surgery at more length with daughter Consults called: Dr. Aundria Rud Admission status: Admit to inpatient  Severity of Illness: The appropriate patient status  for this patient is INPATIENT. Inpatient status is judged to be reasonable and necessary in order to provide the required intensity of service to ensure the patient's safety. The patient's presenting symptoms, physical exam findings, and initial radiographic and laboratory data in the context of their chronic comorbidities is felt to place them at high risk for further clinical deterioration. Furthermore, it is not anticipated that the patient will be medically stable for discharge from the hospital within 2 midnights of admission.   IP status for OR repair of hip fracture.  * I certify that at the point of admission it is my clinical judgment that the patient will require inpatient hospital care spanning beyond 2 midnights from the point of admission due to high intensity of service, high risk for further deterioration and high frequency of surveillance required.*   Ahna Konkle M. DO [facility]  How to contact the [facility] Attending or Consulting provider 7A - 7P or covering provider during after hours 7P -7A, for this patient?  Check the care team in [facility] and look for a) attending/consulting [facility] provider listed and b) the [facility] team listed Log into [email or web address]  Amion Physician Scheduling and messaging for groups and whole hospitals  On call and physician scheduling software for group practices, residents, hospitalists and other medical providers for call, clinic, rotation and shift schedules. OnCall Enterprise is a hospital-wide system for scheduling doctors and paging doctors on call. EasyPlot is for scientific plotting and data analysis.  [email or web address]  and use [place]'s universal password to access. If you do not have the password, please contact the hospital operator.  Locate the [facility] provider you are looking for under [facility] and page to a number that you can be directly reached. If you still have difficulty reaching the provider, please page the [facility] (Director on Call) for  the Hospitalists listed on amion for assistance.  06/19/2021, 11:55 PM

## 2021-06-19 NOTE — ED Provider Notes (Signed)
Emergency Medicine Provider Triage Evaluation Note  Ralph Jackson , a 75 y.o. male  was evaluated in triage.  Pt complains of sided hip pain after a fall.  Review of Systems  Positive: Right hip Negative: Hitting head, LOC  Physical Exam  BP (!) 153/75   Pulse (!) 57   Temp 98.1 F (36.7 C) (Oral)   Resp 16   SpO2 98%  Gen:   Awake, no distress   Resp:  Normal effort  MSK:   Moves extremities without difficulty  Other:  No obvious deformity.  No bleeding.  Tender to palpation  Medical Decision Making  Medically screening exam initiated at 6:28 PM.  Appropriate orders placed.  Ralph Jackson was informed that the remainder of the evaluation will be completed by another provider, this initial triage assessment does not replace that evaluation, and the importance of remaining in the ED until their evaluation is complete.     Woodroe Chen 06/19/21 1830    Gerhard Munch, MD 06/19/21 2201

## 2021-06-19 NOTE — ED Provider Notes (Signed)
[facility] EMERGENCY DEPARTMENT Provider Note   [name]: [id number] Arrival date & time: 06/19/21  1823     History No chief complaint on file.   Ralph Jackson is a 75 y.o. male.  Pt presents to the ED today with fall and right hip pain.  Pt said he was carrying boxes and tripped and fell down about 5 stairs.  He landed on his right hip.  He was unable to ambulate and EMS was called.  Pt denies any other injury.      Past Medical History:  Diagnosis Date   Depression    ETOH abuse    Hypertension    Urinary tract infection     Patient Active Problem List   Diagnosis Date Noted   Closed displaced fracture of right femoral neck (HCC) 06/19/2021   Protein-calorie malnutrition, severe (HCC) 07/08/2014   TSH elevation 07/08/2014   FTT (failure to thrive) in adult 07/08/2014   Alcohol dependence with alcohol-induced mood disorder (HCC)    Alcohol abuse 07/05/2014   Alcohol dependence (HCC) 04/04/2014    History reviewed. No pertinent surgical history.     No family history on file.  Social History   Tobacco Use   Smoking status: Never   Smokeless tobacco: Current  Substance Use Topics   Alcohol use: Not Currently    Alcohol/week: 6.0 standard drinks    Types: 3 Glasses of wine, 3 Cans of beer per week    Comment: Pt states he drinks 3/5 of wine daily   Drug use: No    Home Medications Prior to Admission medications   Medication Sig Start Date End Date Taking? Authorizing Provider  escitalopram (LEXAPRO) 20 MG tablet Take 20 mg by mouth daily. 05/19/21  Yes [provider]  levothyroxine (SYNTHROID) 50 MCG tablet Take 50 mcg by mouth daily. 05/21/21  Yes [provider]  loratadine (CLARITIN) 10 MG tablet Take 1 tablet (10 mg total) by mouth daily. 12/12/20  Yes Wieters, Hallie C, PA-C  metoprolol tartrate (LOPRESSOR) 25 MG tablet Take 25 mg by mouth 2 (two) times daily.  03/01/15  Yes [provider]  Multiple  Vitamins-Minerals (ONE-A-DAY MENS 50+) TABS Take 1 tablet by mouth daily.   Yes [provider]  omeprazole (PRILOSEC) 20 MG capsule Take 20 mg by mouth daily. 05/21/21  Yes [provider]  pravastatin (PRAVACHOL) 10 MG tablet Take 10 mg by mouth at bedtime. 11/26/15  Yes [provider]  PROLIA 60 MG/ML SOSY injection Inject 60 mg into the skin See admin instructions. Every 6 months 06/12/21  Yes [provider]    Allergies    Patient has no known allergies.  Review of Systems   Review of Systems  Musculoskeletal:        Right hip pain  All other systems reviewed and are negative.  Physical Exam Updated Vital Signs BP 113/81   Pulse 88   Temp 98.1 F (36.7 C) (Oral)   Resp 20   SpO2 98%   Physical Exam Vitals and nursing note reviewed.  Constitutional:      Appearance: He is underweight.  HENT:     Head: Normocephalic and atraumatic.     Right Ear: External ear normal.     Left Ear: External ear normal.     Nose: Nose normal.     Mouth/Throat:     Mouth: Mucous membranes are dry.  Eyes:     Extraocular Movements: Extraocular movements intact.  Conjunctiva/sclera: Conjunctivae normal.     Pupils: Pupils are equal, round, and reactive to light.  Cardiovascular:     Rate and Rhythm: Normal rate and regular rhythm.  Pulmonary:     Effort: Pulmonary effort is normal.     Breath sounds: Normal breath sounds.  Abdominal:     General: Abdomen is flat. Bowel sounds are normal.     Palpations: Abdomen is soft.  Musculoskeletal:     Cervical back: Normal range of motion and neck supple.     Right hip: Tenderness present. Decreased range of motion.  Skin:    General: Skin is warm.     Capillary Refill: Capillary refill takes less than 2 seconds.  Neurological:     General: No focal deficit present.     Mental Status: He is alert and oriented to person, place, and time.  Psychiatric:        Mood and Affect: Mood normal.    ED  Results / Procedures / Treatments   Labs (all labs ordered are listed, but only abnormal results are displayed) Labs Reviewed  BASIC METABOLIC PANEL - Abnormal; Notable for the following components:      Result Value   Glucose, Bld 133 (*)    Calcium 8.8 (*)    All other components within normal limits  CBC WITH DIFFERENTIAL/PLATELET - Abnormal; Notable for the following components:   WBC 16.8 (*)    RBC 4.18 (*)    Hemoglobin 12.8 (*)    HCT 38.9 (*)    Neutro Abs 14.0 (*)    Monocytes Absolute 1.3 (*)    All other components within normal limits  RESP PANEL BY RT-PCR (FLU A&B, COVID) ARPGX2  PROTIME-INR  ETHANOL  TSH  URINALYSIS, ROUTINE W REFLEX MICROSCOPIC  TYPE AND SCREEN  ABO/RH    EKG EKG Interpretation  Date/Time:  Tuesday June 19 2021 22:24:11 EDT Ventricular Rate:  75 PR Interval:  178 QRS Duration: 102 QT Interval:  528 QTC Calculation: 590 R Axis:   55 Text Interpretation: Sinus rhythm Atrial premature complex Probable anterior infarct, age indeterminate Prolonged QT interval No significant change since last tracing Confirmed by Jacalyn Lefevre [phone]) on 06/19/2021 10:55:02 PM  Radiology DG Chest Port 1 View  Result Date: 06/19/2021 CLINICAL DATA:  Fall EXAM: PORTABLE CHEST 1 VIEW COMPARISON:  09/14/2019 FINDINGS: Lungs are clear.  No pleural effusion or pneumothorax. The heart is normal in size. Moderate hiatal hernia. Old left rib fracture deformities. IMPRESSION: No evidence of acute cardiopulmonary disease. Electronically Signed   By: Charline Bills M.D.   On: 06/19/2021 22:06   DG Hip Unilat W or Wo Pelvis 2-3 Views Right  Result Date: 06/19/2021 CLINICAL DATA:  Fall, pain. EXAM: DG HIP (WITH OR WITHOUT PELVIS) 2-3V RIGHT COMPARISON:  None. FINDINGS: The bones are osteopenic. There is a subcapital right femoral neck fracture, mildly impacted. There is no dislocation. Joint spaces are maintained. Prominent vascular calcifications are seen in the  soft tissues. IMPRESSION: 1. Subcapital right femoral neck fracture. Electronically Signed   By: Darliss Cheney M.D.   On: 06/19/2021 18:50    Procedures Procedures   Medications Ordered in ED Medications  escitalopram (LEXAPRO) tablet 20 mg (has no administration in time range)  levothyroxine (SYNTHROID) tablet 50 mcg (has no administration in time range)  metoprolol tartrate (LOPRESSOR) tablet 25 mg (has no administration in time range)  loratadine (CLARITIN) tablet 10 mg (has no administration in time range)  pravastatin (PRAVACHOL) tablet 10  mg (has no administration in time range)  pantoprazole (PROTONIX) EC tablet 40 mg (has no administration in time range)  One-A-Day Mens 50+ TABS 1 tablet (has no administration in time range)  morphine 4 MG/ML injection 4 mg (4 mg Intravenous Given 06/19/21 2300)  ondansetron (ZOFRAN) injection 4 mg (4 mg Intravenous Given 06/19/21 2208)  sodium chloride 0.9 % bolus 1,000 mL (1,000 mLs Intravenous New Bag/Given 06/19/21 2209)    ED Course  I have reviewed the triage vital signs and the nursing notes.  Pertinent labs & imaging results that were available during my care of the patient were reviewed by me and considered in my medical decision making (see chart for details).    MDM Rules/Calculators/A&P                           Pt d/w Dr. Aundria Rud (ortho).  He recommends admission to [facility] and NPO after midnight tonight.  Plan for Dr. Linna Caprice to operate hopefully tomorrow, but maybe Thurs.  Pt d/w Dr. Julian Reil (triad) for admission.   Final Clinical Impression(s) / ED Diagnoses Final diagnoses:  Closed fracture of neck of right femur, initial encounter [facility])    Rx / DC Orders ED Discharge Orders     None        Jacalyn Lefevre, MD 06/19/21 2332

## 2021-06-20 ENCOUNTER — Inpatient Hospital Stay (HOSPITAL_COMMUNITY): Payer: Medicare Other

## 2021-06-20 ENCOUNTER — Encounter (HOSPITAL_COMMUNITY): Payer: Self-pay | Admitting: Internal Medicine

## 2021-06-20 DIAGNOSIS — S72001A Fracture of unspecified part of neck of right femur, initial encounter for closed fracture: Secondary | ICD-10-CM | POA: Diagnosis not present

## 2021-06-20 LAB — PREALBUMIN: Prealbumin: 14.8 mg/dL — ABNORMAL LOW (ref 18–38)

## 2021-06-20 LAB — RESP PANEL BY RT-PCR (FLU A&B, COVID) ARPGX2
Influenza A by PCR: NEGATIVE
Influenza B by PCR: NEGATIVE
SARS Coronavirus 2 by RT PCR: NEGATIVE

## 2021-06-20 LAB — TYPE AND SCREEN
ABO/RH(D): A POS
Antibody Screen: NEGATIVE

## 2021-06-20 IMAGING — DX DG KNEE 1-2V PORT*R*
2 series · 2 of 2 positions shown · non-contrast
Comparison: Right hip series [DATE].  Right tib fib [DATE].

CLINICAL DATA: 74-year-old male with right lower extremity pain,
right femoral neck fracture.

EXAM:
PORTABLE RIGHT KNEE - 1-2 VIEW

[knee ap]
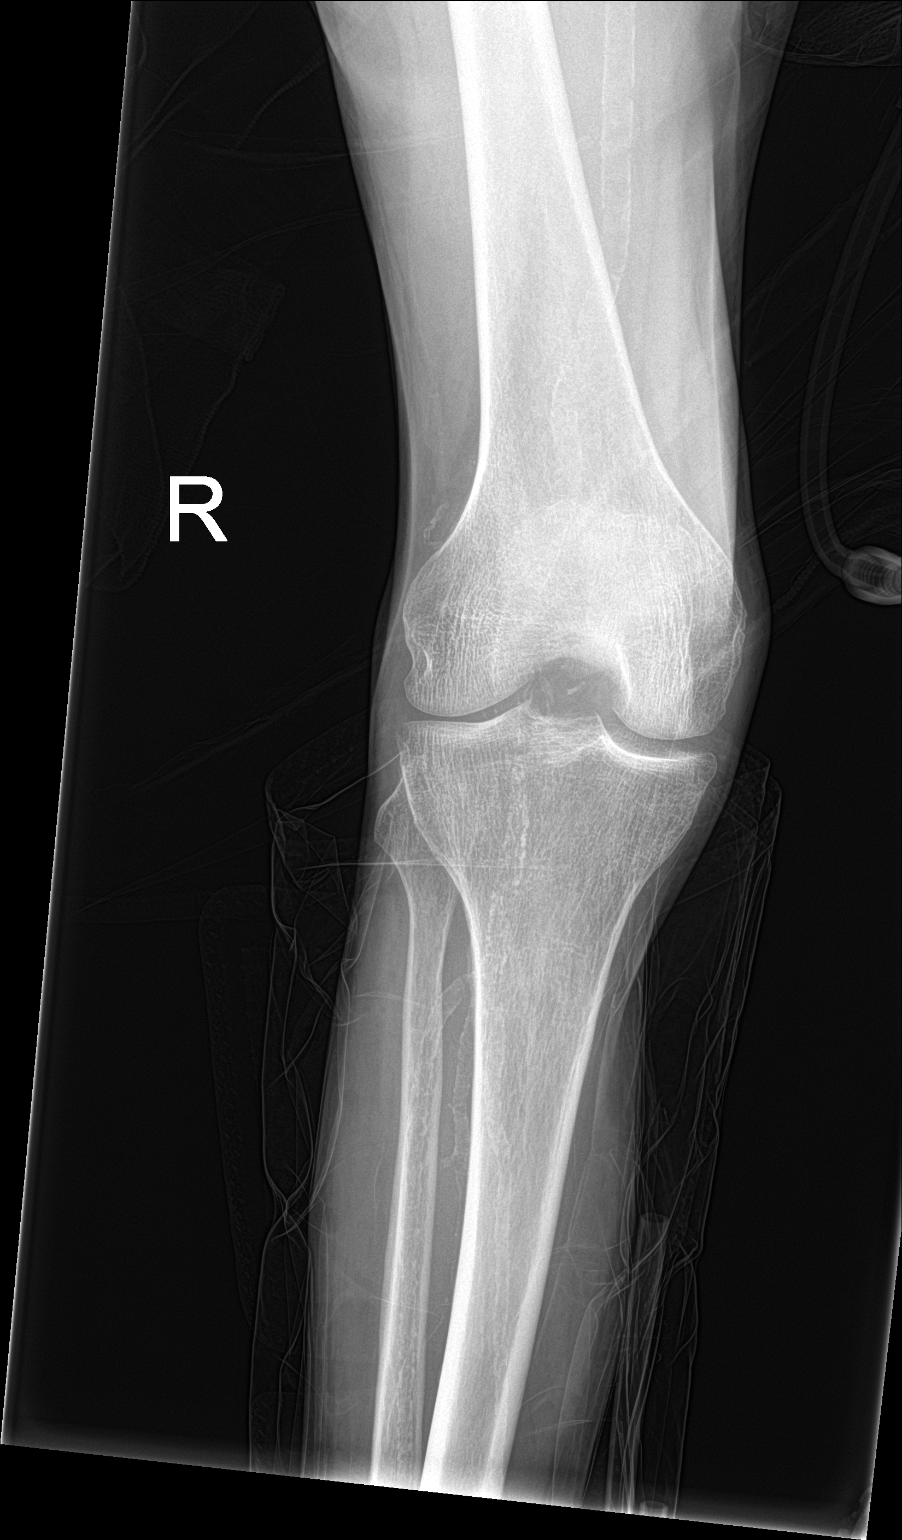

[knee lat]
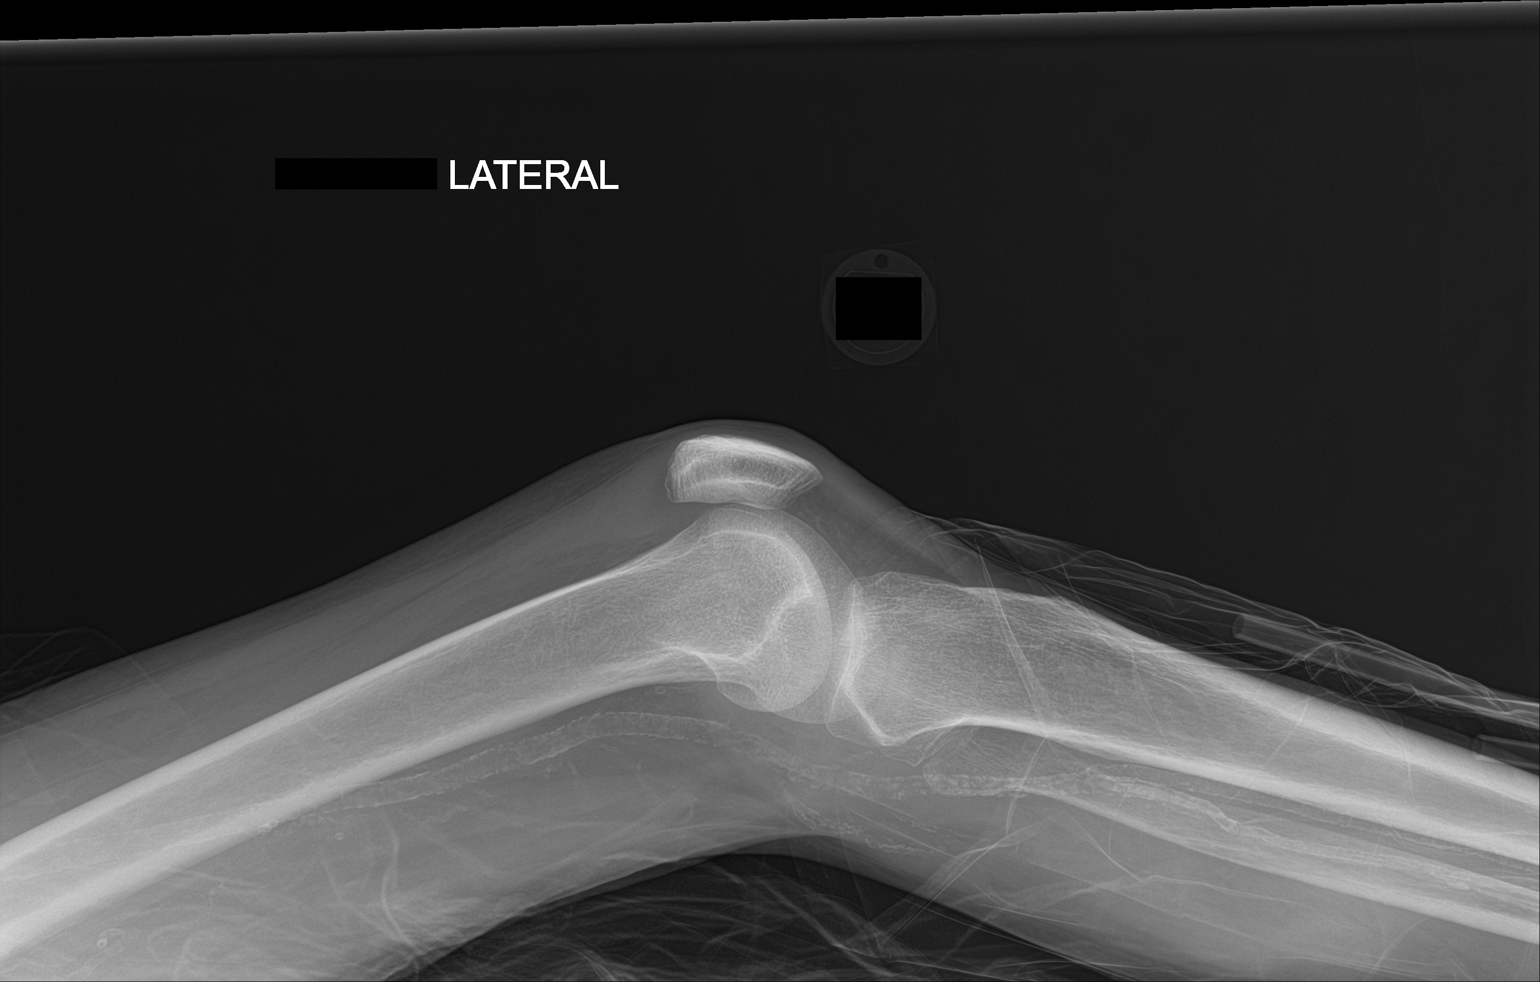

[2 of 2 positions shown; findings below may reference images not displayed]

FINDINGS: Chronic calcified peripheral vascular disease. No definite joint
effusion on the cross-table lateral view. Knee joint alignment and
joint spaces appear stable since [7V]. No acute osseous abnormality
identified.
IMPRESSION: No acute osseous abnormality identified about the right knee.

Calcified peripheral vascular disease.

## 2021-06-20 MED ORDER — PROSOURCE PLUS PO LIQD
30.0000 mL | Freq: Every day | ORAL | Status: DC
  Administered 2021-06-20 – 2021-06-27 (×7): 30 mL via ORAL
  Filled 2021-06-20 (×8): qty 30

## 2021-06-20 MED ORDER — BOOST / RESOURCE BREEZE PO LIQD CUSTOM
1.0000 | ORAL | Status: DC
  Administered 2021-06-22 – 2021-06-23 (×2): 1 via ORAL
  Administered 2021-06-24: 1 mL via ORAL
  Administered 2021-06-25 – 2021-06-27 (×3): 1 via ORAL

## 2021-06-20 MED ORDER — SODIUM CHLORIDE 0.9 % IV BOLUS
500.0000 mL | Freq: Once | INTRAVENOUS | Status: AC
  Administered 2021-06-20: 500 mL via INTRAVENOUS

## 2021-06-20 MED ORDER — ENSURE ENLIVE PO LIQD
237.0000 mL | Freq: Two times a day (BID) | ORAL | Status: DC
  Administered 2021-06-20 – 2021-06-26 (×9): 237 mL via ORAL

## 2021-06-20 MED ORDER — SODIUM CHLORIDE 0.9 % IV SOLN
INTRAVENOUS | Status: DC
Start: 2021-06-20 — End: 2021-06-28

## 2021-06-20 NOTE — Progress Notes (Signed)
Patient hasn't voided since arriving to the unit. Bladder scan performed and pt only has 131 ml of urine in bladder. Will continue to assess.

## 2021-06-20 NOTE — Progress Notes (Signed)
Orthopedic Tech Progress Note Patient Details:  Ralph Jackson [birth year] [id number]  Patient ID: Ralph Jackson, male   DOB: 10-20-1945, 75 y.o.   MRN: [id number]  [name] 06/20/2021, 11:58 AM Overhead trapeze applied

## 2021-06-20 NOTE — NC FL2 (Signed)
MEDICAID FL2 LEVEL OF CARE SCREENING TOOL     IDENTIFICATION  Patient Name: Ralph Jackson Birthdate: 12/25/1945 Sex: male Admission Date (Current Location): 06/19/2021  [facility] and [place] Number:  Producer, television/film/video and Address:  [facility],  [address] [place], [place] [address]      Provider Number: [id number]  Attending Physician Name and Address:  Jerald Kief, MD  Relative Name and Phone Number:       Current Level of Care: Hospital Recommended Level of Care: Skilled Nursing Facility Prior Approval Number:    Date Approved/Denied:   PASRR Number: [id number]  Discharge Plan: SNF    Current Diagnoses: Patient Active Problem List   Diagnosis Date Noted   Closed displaced fracture of right femoral neck (HCC) 06/19/2021   Alcohol abuse, in remission 06/19/2021   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   HTN (hypertension) 06/19/2021   Protein-calorie malnutrition, severe (HCC) 07/08/2014   TSH elevation 07/08/2014   FTT (failure to thrive) in adult 07/08/2014   Alcohol dependence with alcohol-induced mood disorder (HCC)    Alcohol abuse 07/05/2014   Alcohol dependence (HCC) 04/04/2014    Orientation RESPIRATION BLADDER Height & Weight     Self, Time, Situation, Place  Normal Continent Weight: 45.4 kg Height:  5\' 4"  (162.6 cm)  BEHAVIORAL SYMPTOMS/MOOD NEUROLOGICAL BOWEL NUTRITION STATUS      Continent Diet (regular)  AMBULATORY STATUS COMMUNICATION OF NEEDS Skin   Extensive Assist Verbally Normal                       Personal Care Assistance Level of Assistance  Feeding, Dressing, Bathing Bathing Assistance: Limited assistance Feeding assistance: Independent Dressing Assistance: Limited assistance     Functional Limitations Info  Sight, Hearing, Speech Sight Info: Adequate Hearing Info: Adequate Speech Info: Adequate    SPECIAL CARE FACTORS FREQUENCY  PT (By licensed PT), OT (By licensed OT)     PT  Frequency: 5 x weekly OT Frequency: 5 x weekly            Contractures Contractures Info: Not present    Additional Factors Info  Code Status Code Status Info: full             Current Medications (06/20/2021):  This is the current hospital active medication list Current Facility-Administered Medications  Medication Dose Route Frequency Provider Last Rate Last Admin   escitalopram (LEXAPRO) tablet 20 mg  20 mg Oral Daily 06/22/2021, Jared M, DO       HYDROcodone-acetaminophen (NORCO/VICODIN) 5-325 MG per tablet 1-2 tablet  1-2 tablet Oral Q6H PRN M, DO   2 tablet at 06/20/21 0359   levothyroxine (SYNTHROID) tablet 50 mcg  50 mcg Oral Daily 06/22/21 M, DO   50 mcg at 06/20/21 0525   loratadine (CLARITIN) tablet 10 mg  10 mg Oral Daily 06/22/21 M, DO       metoprolol tartrate (LOPRESSOR) tablet 25 mg  25 mg Oral BID M M, DO       morphine 2 MG/ML injection 0.5 mg  0.5 mg Intravenous Q2H PRN M, DO   0.5 mg at 06/20/21 0251   multivitamin with minerals tablet 1 tablet  1 tablet Oral Daily 06/22/21 M, DO       pantoprazole (PROTONIX) EC tablet 40 mg  40 mg Oral Daily M, Jared M, DO       pravastatin (PRAVACHOL) tablet 10 mg  10 mg Oral QHS Hillary Bow, DO         Discharge Medications: Please see discharge summary for a list of discharge medications.  Relevant Imaging Results:  Relevant Lab Results:   Additional Information SSN:[id number]  Golda Acre, RN

## 2021-06-20 NOTE — Plan of Care (Signed)

## 2021-06-20 NOTE — TOC Initial Note (Addendum)
Transition of Care [facility]) - Initial/Assessment Note    Patient Details  Name: Ralph Jackson MRN: [id number] Date of Birth: [birth year]  Transition of Care [facility]) CM/SW Contact:    Golda Acre, RN Phone Number: 06/20/2021, 8:45 AM  Clinical Narrative:                  75 y.o. male with medical history significant of prior EtOH abuse, in remission for several years now.   Pt lives at home with daughter.   Pt was taking out trash, carying boxes and couldn't see steps.  Missed step, fell down about 5 stairs, landed on R hip.  Severe R hip pain after fall, unable to ambulate or bear wt.  Symptoms constant, worse with movement.  Pt in to ED.  passar number:[phone] A Ssn:[id number] Fl2 started   ED Course: R femoral neck fx.   Pt reports no recent CP, SOB, DOE, known cardiac disease.  Normally ambulates unassisted. TOC PLAN OF CARE: Snf placement per md request started. Following for progression. Expected Discharge Plan: Skilled Nursing Facility Barriers to Discharge: Continued Medical Work up   Patient Goals and CMS Choice Patient states their goals for this hospitalization and ongoing recovery are:: to getting over this and go home CMS Medicare.gov Compare Post Acute Care list provided to:: Patient Choice offered to / list presented to : Patient  Expected Discharge Plan and Services Expected Discharge Plan: Skilled Nursing Facility   Discharge Planning Services: CM Consult Post Acute Care Choice: Skilled Nursing Facility Living arrangements for the past 2 months: Single Family Home                                      Prior Living Arrangements/Services Living arrangements for the past 2 months: Single Family Home Lives with:: Self Patient language and need for interpreter reviewed:: Yes Do you feel safe going back to the place where you live?: Yes            Criminal Activity/Legal Involvement Pertinent to Current Situation/Hospitalization: No -  Comment as needed  Activities of Daily Living Home Assistive Devices/Equipment: Cane (specify quad or straight) ADL Screening (condition at time of admission) Patient's cognitive ability adequate to safely complete daily activities?: Yes Is the patient deaf or have difficulty hearing?: Yes Does the patient have difficulty seeing, even when wearing glasses/contacts?: No Does the patient have difficulty concentrating, remembering, or making decisions?: No Patient able to express need for assistance with ADLs?: Yes Does the patient have difficulty dressing or bathing?: No Independently performs ADLs?: No Communication: Independent Dressing (OT): Appropriate for developmental age, Needs assistance Is this a change from baseline?: Change from baseline, expected to last >3 days Grooming: Appropriate for developmental age, Needs assistance Is this a change from baseline?: Change from baseline, expected to last >3 days Feeding: Independent Bathing: Needs assistance Is this a change from baseline?: Change from baseline, expected to last >3 days Toileting: Independent with device (comment) (urinal) In/Out Bed: Needs assistance Is this a change from baseline?: Change from baseline, expected to last >3 days Walks in Home: Needs assistance Is this a change from baseline?: Change from baseline, expected to last >3 days Does the patient have difficulty walking or climbing stairs?: Yes Weakness of Legs: Right Weakness of Arms/Hands: None  Permission Sought/Granted                  Emotional  Assessment Appearance:: Appears stated age     Orientation: : Oriented to Self, Oriented to Place, Oriented to  Time, Oriented to Situation Alcohol / Substance Use: Not Applicable Psych Involvement: No (comment)  Admission diagnosis:  Closed fracture of neck of right femur, initial encounter (HCC) [S72.001A] Closed displaced fracture of right femoral neck (HCC) [S72.001A] Patient Active Problem List    Diagnosis Date Noted   Closed displaced fracture of right femoral neck (HCC) 06/19/2021   Alcohol abuse, in remission 06/19/2021   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 [place] [address] [place] [address] Phone: [phone] Fax: [phone]  [id number] - [address] [place] - [address] AT [facility] [address] [place] [address] Phone: [phone] Fax: [phone]     Social Determinants of Health (SDOH) Interventions    Readmission Risk Interventions No flowsheet data found.

## 2021-06-20 NOTE — Progress Notes (Signed)
PROGRESS NOTE    SHYHEIM TANNEY  NTI:[id number] DOB: 02-22-1946 DOA: 06/19/2021 PCP: Ronnald Collum    Brief Narrative:  75 y.o. male with medical history significant of prior EtOH abuse, in remission for several years now. Pt reportedly was taking out trash, carying boxes and couldn't see steps.  Missed step, fell down about 5 stairs, landed on R hip.  Severe R hip pain after fall, unable to ambulate or bear wt.  Symptoms constant, worse with movement.  In the ED, pt was found to have a R femoral neck fracture. Pt was admitted and orthopedic surgery consulted  Assessment & Plan:   Principal Problem:   Closed displaced fracture of right femoral neck (HCC) Active Problems:   Alcohol abuse, in remission   Alcoholic dementia (HCC)   HTN (hypertension)   R femoral neck fx Orthopedic surgery was consulted, patient plan for surgery today Continue with analgesia as needed Anticipate PT/OT evaluation postoperatively. Prior EtOH abuse - Sober for several years now Does have mild EtOH dementia after 50 years of heavy drinking No evidence of withdrawals at this time HTN - Cont metoprolol Blood pressure trends reviewed, blood pressure appears stable Dehydration On exam, patient is clinically and markedly dehydrated with dry mucous membranes, poor skin turgor. Nursing staff also reports decreased urine output overnight. Have ordered 500 cc normal saline bolus, normal saline at 75 cc/h to rehydrate. Postoperatively, would encourage patient self hydrate   DVT prophylaxis: SCD's Code Status: Full Family Communication: Pt in room, family not at bedside  Status is: Inpatient  Remains inpatient appropriate because: severity of illness    Consultants:  Orthopedic Surgery  Procedures:    Antimicrobials: Anti-infectives (From admission, onward)    None       Subjective: Complaining of hip pain. Pt was seen prior to surgery  Objective: Vitals:   06/20/21 0336  06/20/21 0523 06/20/21 0949 06/20/21 1357  BP: 125/69 132/81 (!) 150/91 129/75  Pulse: 76 81 76 80  Resp: 20 20  20   Temp: 98.8 F (37.1 C) 99 F (37.2 C) 98.3 F (36.8 C) 98.3 F (36.8 C)  TempSrc: Oral Oral Oral Oral  SpO2: 98% 98%  (!) 77%  Weight:      Height:        Intake/Output Summary (Last 24 hours) at 06/20/2021 1608 Last data filed at 06/20/2021 0006 Gross per 24 hour  Intake 1000 ml  Output --  Net 1000 ml   Filed Weights   06/20/21 0006  Weight: 45.4 kg    Examination: General exam: Awake, laying in bed, in nad Respiratory system: Normal respiratory effort, no wheezing Cardiovascular system: regular rate, s1, s2 Gastrointestinal system: Soft, nondistended, positive BS Central nervous system: CN2-12 grossly intact, strength intact Extremities: Perfused, no clubbing Skin: Normal skin turgor, no notable skin lesions seen Psychiatry: Mood normal // no visual hallucinations   Data Reviewed: I have personally reviewed following labs and imaging studies  CBC: Recent Labs  Lab 06/19/21 2205  WBC 16.8*  NEUTROABS 14.0*  HGB 12.8*  HCT 38.9*  MCV 93.1  PLT 266   Basic Metabolic Panel: Recent Labs  Lab 06/19/21 2205  NA 138  K 3.8  CL 102  CO2 25  GLUCOSE 133*  BUN 18  CREATININE 1.00  CALCIUM 8.8*   GFR: Estimated Creatinine Clearance: 41.6 mL/min (by C-G formula based on SCr of 1 mg/dL). Liver Function Tests: No results for input(s): AST, ALT, ALKPHOS, BILITOT, PROT, ALBUMIN in the  last 168 hours. No results for input(s): LIPASE, AMYLASE in the last 168 hours. No results for input(s): AMMONIA in the last 168 hours. Coagulation Profile: Recent Labs  Lab 06/19/21 2205  INR 0.9   Cardiac Enzymes: No results for input(s): CKTOTAL, CKMB, CKMBINDEX, TROPONINI in the last 168 hours. BNP (last 3 results) No results for input(s): PROBNP in the last 8760 hours. HbA1C: No results for input(s): HGBA1C in the last 72 hours. CBG: No results for  input(s): GLUCAP in the last 168 hours. Lipid Profile: No results for input(s): CHOL, HDL, LDLCALC, TRIG, CHOLHDL, LDLDIRECT in the last 72 hours. Thyroid Function Tests: Recent Labs    06/19/21 2205  TSH 3.312   Anemia Panel: No results for input(s): VITAMINB12, FOLATE, FERRITIN, TIBC, IRON, RETICCTPCT in the last 72 hours. Sepsis Labs: No results for input(s): PROCALCITON, LATICACIDVEN in the last 168 hours.  Recent Results (from the past 240 hour(s))  Resp Panel by RT-PCR (Flu A&B, Covid) Nasopharyngeal Swab     Status: None   Collection Time: 06/19/21 10:20 PM   Specimen: Nasopharyngeal Swab; Nasopharyngeal(NP) swabs in vial transport medium  Result Value Ref Range Status   SARS Coronavirus 2 by RT PCR NEGATIVE NEGATIVE Final    Comment: (NOTE) SARS-CoV-2 target nucleic acids are NOT DETECTED.  The SARS-CoV-2 RNA is generally detectable in upper respiratory specimens during the acute phase of infection. The lowest concentration of SARS-CoV-2 viral copies this assay can detect is 138 copies/mL. A negative result does not preclude SARS-Cov-2 infection and should not be used as the sole basis for treatment or other patient management decisions. A negative result may occur with  improper specimen collection/handling, submission of specimen other than nasopharyngeal swab, presence of viral mutation(s) within the areas targeted by this assay, and inadequate number of viral copies(<138 copies/mL). A negative result must be combined with clinical observations, patient history, and epidemiological information. The expected result is Negative.  Fact Sheet for Patients:  BloggerCourse.com  Fact Sheet for Healthcare Providers:  SeriousBroker.it  This test is no t yet approved or cleared by the [place] FDA and  has been authorized for detection and/or diagnosis of SARS-CoV-2 by FDA under an Emergency Use Authorization (EUA).  This EUA will remain  in effect (meaning this test can be used) for the duration of the COVID-19 declaration under Section 564(b)(1) of the Act, 21 U.S.C.section 360bbb-3(b)(1), unless the authorization is terminated  or revoked sooner.       Influenza A by PCR NEGATIVE NEGATIVE Final   Influenza B by PCR NEGATIVE NEGATIVE Final    Comment: (NOTE) The Xpert Xpress SARS-CoV-2/FLU/RSV plus assay is intended as an aid in the diagnosis of influenza from Nasopharyngeal swab specimens and should not be used as a sole basis for treatment. Nasal washings and aspirates are unacceptable for Xpert Xpress SARS-CoV-2/FLU/RSV testing.  Fact Sheet for Patients: BloggerCourse.com  Fact Sheet for Healthcare Providers: SeriousBroker.it  This test is not yet approved or cleared by the [place] FDA and has been authorized for detection and/or diagnosis of SARS-CoV-2 by FDA under an Emergency Use Authorization (EUA). This EUA will remain in effect (meaning this test can be used) for the duration of the COVID-19 declaration under Section 564(b)(1) of the Act, 21 U.S.C. section 360bbb-3(b)(1), unless the authorization is terminated or revoked.  Performed at [facility], [address], [place], [place] [address]      Radiology Studies: DG Chest Port 1 View  Result Date: 06/19/2021 CLINICAL  DATA:  Fall EXAM: PORTABLE CHEST 1 VIEW COMPARISON:  09/14/2019 FINDINGS: Lungs are clear.  No pleural effusion or pneumothorax. The heart is normal in size. Moderate hiatal hernia. Old left rib fracture deformities. IMPRESSION: No evidence of acute cardiopulmonary disease. Electronically Signed   By: Charline Bills M.D.   On: 06/19/2021 22:06   DG Knee Right Port  Result Date: 06/20/2021 CLINICAL DATA:  75 year old male with right lower extremity pain, right femoral neck fracture. EXAM: PORTABLE RIGHT KNEE - 1-2 VIEW COMPARISON:  Right hip  series 06/19/2021.  Right tib fib 02/15/2014. FINDINGS: Chronic calcified peripheral vascular disease. No definite joint effusion on the cross-table lateral view. Knee joint alignment and joint spaces appear stable since 2015. No acute osseous abnormality identified. IMPRESSION: No acute osseous abnormality identified about the right knee. Calcified peripheral vascular disease. Electronically Signed   By: Odessa Fleming M.D.   On: 06/20/2021 09:26   DG Hip Unilat W or Wo Pelvis 2-3 Views Right  Result Date: 06/19/2021 CLINICAL DATA:  Fall, pain. EXAM: DG HIP (WITH OR WITHOUT PELVIS) 2-3V RIGHT COMPARISON:  None. FINDINGS: The bones are osteopenic. There is a subcapital right femoral neck fracture, mildly impacted. There is no dislocation. Joint spaces are maintained. Prominent vascular calcifications are seen in the soft tissues. IMPRESSION: 1. Subcapital right femoral neck fracture. Electronically Signed   By: Darliss Cheney M.D.   On: 06/19/2021 18:50    Scheduled Meds:  (feeding supplement) PROSource Plus  30 mL Oral Daily   escitalopram  20 mg Oral Daily   [START ON 06/21/2021] feeding supplement  1 Container Oral Q24H   feeding supplement  237 mL Oral BID BM   levothyroxine  50 mcg Oral Daily   loratadine  10 mg Oral Daily   metoprolol tartrate  25 mg Oral BID   multivitamin with minerals  1 tablet Oral Daily   pantoprazole  40 mg Oral Daily   pravastatin  10 mg Oral QHS   Continuous Infusions:  sodium chloride 75 mL/hr at 06/20/21 1340     LOS: 1 day   Rickey Barbara, MD [facility] Pager On Amion  If 7PM-7AM, please contact night-coverage 06/20/2021, 4:08 PM

## 2021-06-20 NOTE — H&P (View-Only) (Signed)
ORTHOPAEDIC CONSULTATION  REQUESTING PHYSICIAN: Jerald Kief, MD  PCP:  [name], PA-C  Chief Complaint: Right hip pain  HPI: Ralph Jackson is a 75 y.o. male who complains of right hip pain secondary to a fall. PMH significant for EtOH abuse, in remission for several years. The patient lives with his daughter and was taking out the trash. He missed a step, fell down approximately 5 steps, and had right hip pain. He was unable to bear weight secondary to the fall.  EMS transported him and he was found to have a right hip fracture at the emergency department.Orthopedics was consulted for further management and treatment of the fracture. Hospitalist service admitted the patient.   Past Medical History:  Diagnosis Date   Depression    ETOH abuse    In remission for several years now   Hypertension    Urinary tract infection    History reviewed. No pertinent surgical history. Social History   Socioeconomic History   Marital status: Single    Spouse name: Not on file   Number of children: Not on file   Years of education: Not on file   Highest education level: Not on file  Occupational History   Not on file  Tobacco Use   Smoking status: Never   Smokeless tobacco: Current  Substance and Sexual Activity   Alcohol use: Not Currently    Alcohol/week: 6.0 standard drinks    Types: 3 Glasses of wine, 3 Cans of beer per week    Comment: Pt states he drinks 3/5 of wine daily   Drug use: No   Sexual activity: Not Currently  Other Topics Concern   Not on file  Social History Narrative   Not on file   Social Determinants of Health   Financial Resource Strain: Not on file  Food Insecurity: Not on file  Transportation Needs: Not on file  Physical Activity: Not on file  Stress: Not on file  Social Connections: Not on file   Family History  Problem Relation Age of Onset   Osteoporosis Neg Hx    No Known Allergies Prior to Admission medications    Medication Sig Start Date End Date Taking? Authorizing Provider  escitalopram (LEXAPRO) 20 MG tablet Take 20 mg by mouth daily. 05/19/21  Yes [provider]  levothyroxine (SYNTHROID) 50 MCG tablet Take 50 mcg by mouth daily. 05/21/21  Yes [provider]  loratadine (CLARITIN) 10 MG tablet Take 1 tablet (10 mg total) by mouth daily. 12/12/20  Yes Wieters, Hallie C, PA-C  metoprolol tartrate (LOPRESSOR) 25 MG tablet Take 25 mg by mouth 2 (two) times daily.  03/01/15  Yes [provider]  Multiple Vitamins-Minerals (ONE-A-DAY MENS 50+) TABS Take 1 tablet by mouth daily.   Yes [provider]  omeprazole (PRILOSEC) 20 MG capsule Take 20 mg by mouth daily. 05/21/21  Yes [provider]  pravastatin (PRAVACHOL) 10 MG tablet Take 10 mg by mouth at bedtime. 11/26/15  Yes [provider]  PROLIA 60 MG/ML SOSY injection Inject 60 mg into the skin See admin instructions. Every 6 months 06/12/21  Yes [provider]   DG Chest Port 1 View  Result Date: 06/19/2021 CLINICAL DATA:  Fall EXAM: PORTABLE CHEST 1 VIEW COMPARISON:  09/14/2019 FINDINGS: Lungs are clear.  No pleural effusion or pneumothorax. The heart is normal in size. Moderate hiatal hernia. Old left rib fracture deformities. IMPRESSION: No evidence of acute cardiopulmonary disease. Electronically Signed  By: Charline Bills M.D.   On: 06/19/2021 22:06   DG Knee Right Port  Result Date: 06/20/2021 CLINICAL DATA:  75 year old male with right lower extremity pain, right femoral neck fracture. EXAM: PORTABLE RIGHT KNEE - 1-2 VIEW COMPARISON:  Right hip series 06/19/2021.  Right tib fib 02/15/2014. FINDINGS: Chronic calcified peripheral vascular disease. No definite joint effusion on the cross-table lateral view. Knee joint alignment and joint spaces appear stable since 2015. No acute osseous abnormality identified. IMPRESSION: No acute osseous abnormality identified about the right knee.  Calcified peripheral vascular disease. Electronically Signed   By: Odessa Fleming M.D.   On: 06/20/2021 09:26   DG Hip Unilat W or Wo Pelvis 2-3 Views Right  Result Date: 06/19/2021 CLINICAL DATA:  Fall, pain. EXAM: DG HIP (WITH OR WITHOUT PELVIS) 2-3V RIGHT COMPARISON:  None. FINDINGS: The bones are osteopenic. There is a subcapital right femoral neck fracture, mildly impacted. There is no dislocation. Joint spaces are maintained. Prominent vascular calcifications are seen in the soft tissues. IMPRESSION: 1. Subcapital right femoral neck fracture. Electronically Signed   By: Darliss Cheney M.D.   On: 06/19/2021 18:50    Positive ROS: All other systems have been reviewed and were otherwise negative with the exception of those mentioned in the HPI and as above.  Physical Exam: General: Alert, no acute distress Cardiovascular: No pedal edema Respiratory: No cyanosis, no use of accessory musculature GI: No organomegaly, abdomen is soft and non-tender Skin: No lesions in the area of chief complaint Neurologic: Sensation intact distally Psychiatric: Patient is competent for consent with normal mood and affect Lymphatic: No axillary or cervical lymphadenopathy  MUSCULOSKELETAL: RLE shortened and externally rotated. Palpable pedal pulse. Plantar and dorsiflexion intact  Assessment: Subcapital right femoral neck fracture  Plan: Right hip fracture: I discussed the situation with the patient. This type of femoral neck fracture is best fixed with total hip arthroplasty. I discussed risks and benefits, with risks including blood clot, infection, leg length discrepancy, and even death. The alternative is no surgical fixation which would leave the patient bedridden. The patient decided to proceed with surgery and consented to right total hip arthroplasty. Surgery planned for Thursday afternoon with Dr.Swinteck. Keep patient NPO after midnight. Hold chemical DVT prophylaxis    Darrick Grinder, [place] [phone]    06/20/2021 11:21 AM

## 2021-06-20 NOTE — Consult Note (Signed)
ORTHOPAEDIC CONSULTATION  REQUESTING PHYSICIAN: Jerald Kief, MD  PCP:  [name], PA-C  Chief Complaint: Right hip pain  HPI: Ralph Jackson is a 75 y.o. male who complains of right hip pain secondary to a fall. PMH significant for EtOH abuse, in remission for several years. The patient lives with his daughter and was taking out the trash. He missed a step, fell down approximately 5 steps, and had right hip pain. He was unable to bear weight secondary to the fall.  EMS transported him and he was found to have a right hip fracture at the emergency department.Orthopedics was consulted for further management and treatment of the fracture. Hospitalist service admitted the patient.   Past Medical History:  Diagnosis Date   Depression    ETOH abuse    In remission for several years now   Hypertension    Urinary tract infection    History reviewed. No pertinent surgical history. Social History   Socioeconomic History   Marital status: Single    Spouse name: Not on file   Number of children: Not on file   Years of education: Not on file   Highest education level: Not on file  Occupational History   Not on file  Tobacco Use   Smoking status: Never   Smokeless tobacco: Current  Substance and Sexual Activity   Alcohol use: Not Currently    Alcohol/week: 6.0 standard drinks    Types: 3 Glasses of wine, 3 Cans of beer per week    Comment: Pt states he drinks 3/5 of wine daily   Drug use: No   Sexual activity: Not Currently  Other Topics Concern   Not on file  Social History Narrative   Not on file   Social Determinants of Health   Financial Resource Strain: Not on file  Food Insecurity: Not on file  Transportation Needs: Not on file  Physical Activity: Not on file  Stress: Not on file  Social Connections: Not on file   Family History  Problem Relation Age of Onset   Osteoporosis Neg Hx    No Known Allergies Prior to Admission medications    Medication Sig Start Date End Date Taking? Authorizing Provider  escitalopram (LEXAPRO) 20 MG tablet Take 20 mg by mouth daily. 05/19/21  Yes [provider]  levothyroxine (SYNTHROID) 50 MCG tablet Take 50 mcg by mouth daily. 05/21/21  Yes [provider]  loratadine (CLARITIN) 10 MG tablet Take 1 tablet (10 mg total) by mouth daily. 12/12/20  Yes Wieters, Hallie C, PA-C  metoprolol tartrate (LOPRESSOR) 25 MG tablet Take 25 mg by mouth 2 (two) times daily.  03/01/15  Yes [provider]  Multiple Vitamins-Minerals (ONE-A-DAY MENS 50+) TABS Take 1 tablet by mouth daily.   Yes [provider]  omeprazole (PRILOSEC) 20 MG capsule Take 20 mg by mouth daily. 05/21/21  Yes [provider]  pravastatin (PRAVACHOL) 10 MG tablet Take 10 mg by mouth at bedtime. 11/26/15  Yes [provider]  PROLIA 60 MG/ML SOSY injection Inject 60 mg into the skin See admin instructions. Every 6 months 06/12/21  Yes [provider]   DG Chest Port 1 View  Result Date: 06/19/2021 CLINICAL DATA:  Fall EXAM: PORTABLE CHEST 1 VIEW COMPARISON:  09/14/2019 FINDINGS: Lungs are clear.  No pleural effusion or pneumothorax. The heart is normal in size. Moderate hiatal hernia. Old left rib fracture deformities. IMPRESSION: No evidence of acute cardiopulmonary disease. Electronically Signed  By: Charline Bills M.D.   On: 06/19/2021 22:06   DG Knee Right Port  Result Date: 06/20/2021 CLINICAL DATA:  75 year old male with right lower extremity pain, right femoral neck fracture. EXAM: PORTABLE RIGHT KNEE - 1-2 VIEW COMPARISON:  Right hip series 06/19/2021.  Right tib fib 02/15/2014. FINDINGS: Chronic calcified peripheral vascular disease. No definite joint effusion on the cross-table lateral view. Knee joint alignment and joint spaces appear stable since 2015. No acute osseous abnormality identified. IMPRESSION: No acute osseous abnormality identified about the right knee.  Calcified peripheral vascular disease. Electronically Signed   By: Odessa Fleming M.D.   On: 06/20/2021 09:26   DG Hip Unilat W or Wo Pelvis 2-3 Views Right  Result Date: 06/19/2021 CLINICAL DATA:  Fall, pain. EXAM: DG HIP (WITH OR WITHOUT PELVIS) 2-3V RIGHT COMPARISON:  None. FINDINGS: The bones are osteopenic. There is a subcapital right femoral neck fracture, mildly impacted. There is no dislocation. Joint spaces are maintained. Prominent vascular calcifications are seen in the soft tissues. IMPRESSION: 1. Subcapital right femoral neck fracture. Electronically Signed   By: Darliss Cheney M.D.   On: 06/19/2021 18:50    Positive ROS: All other systems have been reviewed and were otherwise negative with the exception of those mentioned in the HPI and as above.  Physical Exam: General: Alert, no acute distress Cardiovascular: No pedal edema Respiratory: No cyanosis, no use of accessory musculature GI: No organomegaly, abdomen is soft and non-tender Skin: No lesions in the area of chief complaint Neurologic: Sensation intact distally Psychiatric: Patient is competent for consent with normal mood and affect Lymphatic: No axillary or cervical lymphadenopathy  MUSCULOSKELETAL: RLE shortened and externally rotated. Palpable pedal pulse. Plantar and dorsiflexion intact  Assessment: Subcapital right femoral neck fracture  Plan: Right hip fracture: I discussed the situation with the patient. This type of femoral neck fracture is best fixed with total hip arthroplasty. I discussed risks and benefits, with risks including blood clot, infection, leg length discrepancy, and even death. The alternative is no surgical fixation which would leave the patient bedridden. The patient decided to proceed with surgery and consented to right total hip arthroplasty. Surgery planned for Thursday afternoon with Dr.Swinteck. Keep patient NPO after midnight. Hold chemical DVT prophylaxis    Darrick Grinder, [place] [phone]    06/20/2021 11:21 AM

## 2021-06-20 NOTE — ED Notes (Signed)
Called [facility] to transport patient to [facility]. Erika R. Rowland. States they will call to get report shortly.

## 2021-06-20 NOTE — Progress Notes (Addendum)
Initial Nutrition Assessment  DOCUMENTATION CODES:   Severe malnutrition in context of chronic illness, Underweight  INTERVENTION:  - will order Boost Breeze once/day each supplement provides 250 kcal and 9 grams of protein. - will order Ensure Enlive BID, each supplement provides 350 kcal and 20 grams of protein. - will order 30 ml Prosource Plus once/day, each supplement provides 100 kcal and 15 grams protein.    NUTRITION DIAGNOSIS:   Severe Malnutrition related to chronic illness as evidenced by severe fat depletion, severe muscle depletion.  GOAL:   Patient will meet greater than or equal to 90% of their needs  MONITOR:   PO intake, Supplement acceptance, Labs, Weight trends  REASON FOR ASSESSMENT:   Consult Assessment of nutrition requirement/status  ASSESSMENT:   75 y.o. male with medical history os HTN, depression, and alcohol abuse which has been in remission for several years. He lives with his daughter. He presented to the ED after falling down the steps while attempting to take the trash out. He had severe R hip pain after the fall and was unable to ambulate or bear weight.  No meal intakes documented since admission. Patient requests a Sprite; provided to him.   He reports that he did not eat breakfast and does not feel up to having anything for lunch. RD offered x3 throughout visit to order lunch but patient declined each time.  He reports that he typically eats 3 meals/day. Breakfast is usually oatmeal, lunch is a sandwich, and dinner is variable.   He denies any changes in appetite recently. He denies any pain or difficulty with eating.   He does not weigh himself at home and is unsure of UBW. Weight today is 100 lb and weight on 12/16/15 was 101 lb.   Patient with R femoral neck fx and pending surgery for the same on 10/20.   ADDENDUM FROM 1749:  Secure chat message received from RN that daughter was present at bedside. Able to talk with daughter, with  whom patient lives.   Patient has a very large bedroom in her home. She keeps a mini fridge in his room stocked with items such as water and Boost supplements and encourages patient to drink 1-2 bottles of Boost/day, dependent on the day and other PO intakes. He prefers savory, salty items such as chips which are also available to him at home at all times.   Daughter shares that patient is 5\' 1"  rather than 5\' 4"  as documented in the chart. Will make this adjustment in the chart and changed it on the white board in the room.   Patient likes chocolate flavored supplements (such as Ensure). Will make note of this in the order for Ensure.    Labs reviewed. Medications reviewed; 50 mcg oral synthroid/day, 1 tablet multivitamin with minerals/day, 40 mg oral protonix/day. IVF; NS @ 75 ml/hr.     NUTRITION - FOCUSED PHYSICAL EXAM:  Flowsheet Row Most Recent Value  Orbital Region Severe depletion  Upper Arm Region Severe depletion  Thoracic and Lumbar Region Unable to assess  Buccal Region Severe depletion  Temple Region Severe depletion  Clavicle Bone Region Severe depletion  Clavicle and Acromion Bone Region Severe depletion  Scapular Bone Region Unable to assess  Dorsal Hand Severe depletion  Patellar Region Unable to assess  Anterior Thigh Region Unable to assess  Posterior Calf Region Unable to assess  Edema (RD Assessment) Unable to assess  Eyes Reviewed  Mouth Reviewed  [very poor dentition with many missing  teeth]  Skin Reviewed  Nails Reviewed       Diet Order:   Diet Order             Diet NPO time specified  Diet effective ____           Diet regular Room service appropriate? Yes; Fluid consistency: Thin  Diet effective now                   EDUCATION NEEDS:   Not appropriate for education at this time  Skin:  Skin Assessment: Reviewed RN Assessment  Last BM:  PTA/unknown  Height:   Ht Readings from Last 1 Encounters:  06/20/21 5\' 4"  (1.626 m)     Weight:   Wt Readings from Last 1 Encounters:  06/20/21 45.4 kg     Estimated Nutritional Needs:  Kcal:  1600-1850 kcal Protein:  90-100 grams Fluid:  >/= 1.7 L/day     06/22/21, MS, RD, LDN, CNSC Inpatient Clinical Dietitian RD pager # available in [facility]  After hours/weekend pager # available in [facility]

## 2021-06-21 ENCOUNTER — Inpatient Hospital Stay (HOSPITAL_COMMUNITY): Payer: Medicare Other

## 2021-06-21 ENCOUNTER — Inpatient Hospital Stay (HOSPITAL_COMMUNITY): Payer: Medicare Other | Admitting: Anesthesiology

## 2021-06-21 ENCOUNTER — Encounter (HOSPITAL_COMMUNITY): Payer: Self-pay | Admitting: Internal Medicine

## 2021-06-21 ENCOUNTER — Encounter (HOSPITAL_COMMUNITY)
Admission: EM | Disposition: A | Discharge status: Home Health | Payer: Self-pay | Source: Home / Self Care | Attending: Internal Medicine

## 2021-06-21 DIAGNOSIS — F1011 Alcohol abuse, in remission: Secondary | ICD-10-CM | POA: Diagnosis not present

## 2021-06-21 DIAGNOSIS — S72001A Fracture of unspecified part of neck of right femur, initial encounter for closed fracture: Secondary | ICD-10-CM

## 2021-06-21 DIAGNOSIS — S72041A Displaced fracture of base of neck of right femur, initial encounter for closed fracture: Secondary | ICD-10-CM | POA: Diagnosis not present

## 2021-06-21 HISTORY — PX: TOTAL HIP ARTHROPLASTY: SHX124

## 2021-06-21 LAB — CBC
HCT: 35 % — ABNORMAL LOW (ref 39.0–52.0)
Hemoglobin: 11.5 g/dL — ABNORMAL LOW (ref 13.0–17.0)
MCH: 30.8 pg (ref 26.0–34.0)
MCHC: 32.9 g/dL (ref 30.0–36.0)
MCV: 93.8 fL (ref 80.0–100.0)
Platelets: 199 10*3/uL (ref 150–400)
RBC: 3.73 MIL/uL — ABNORMAL LOW (ref 4.22–5.81)
RDW: 13.8 % (ref 11.5–15.5)
WBC: 13.8 10*3/uL — ABNORMAL HIGH (ref 4.0–10.5)
nRBC: 0 % (ref 0.0–0.2)

## 2021-06-21 LAB — SURGICAL PCR SCREEN
MRSA, PCR: NEGATIVE
Staphylococcus aureus: NEGATIVE

## 2021-06-21 LAB — COMPREHENSIVE METABOLIC PANEL
ALT: 14 U/L (ref 0–44)
AST: 20 U/L (ref 15–41)
Albumin: 3.1 g/dL — ABNORMAL LOW (ref 3.5–5.0)
Alkaline Phosphatase: 53 U/L (ref 38–126)
Anion gap: 4 — ABNORMAL LOW (ref 5–15)
BUN: 18 mg/dL (ref 8–23)
CO2: 27 mmol/L (ref 22–32)
Calcium: 7.7 mg/dL — ABNORMAL LOW (ref 8.9–10.3)
Chloride: 107 mmol/L (ref 98–111)
Creatinine, Ser: 0.63 mg/dL (ref 0.61–1.24)
GFR, Estimated: >60 mL/min (ref >60)
Glucose, Bld: 109 mg/dL — ABNORMAL HIGH (ref 70–99)
Potassium: 4.7 mmol/L (ref 3.5–5.1)
Sodium: 138 mmol/L (ref 135–145)
Total Bilirubin: 0.4 mg/dL (ref 0.3–1.2)
Total Protein: 5.8 g/dL — ABNORMAL LOW (ref 6.5–8.1)

## 2021-06-21 LAB — GLUCOSE, CAPILLARY: Glucose-Capillary: 112 mg/dL — ABNORMAL HIGH (ref 70–99)

## 2021-06-21 LAB — BRAIN NATRIURETIC PEPTIDE: B Natriuretic Peptide: 791.1 pg/mL — ABNORMAL HIGH (ref 0.0–100.0)

## 2021-06-21 IMAGING — DX DG PORTABLE PELVIS
1 series · 1 of 1 positions shown · non-contrast
Comparison: [DATE]

CLINICAL DATA: Postop

EXAM:
PORTABLE PELVIS 1-2 VIEWS

[pelvis ap]
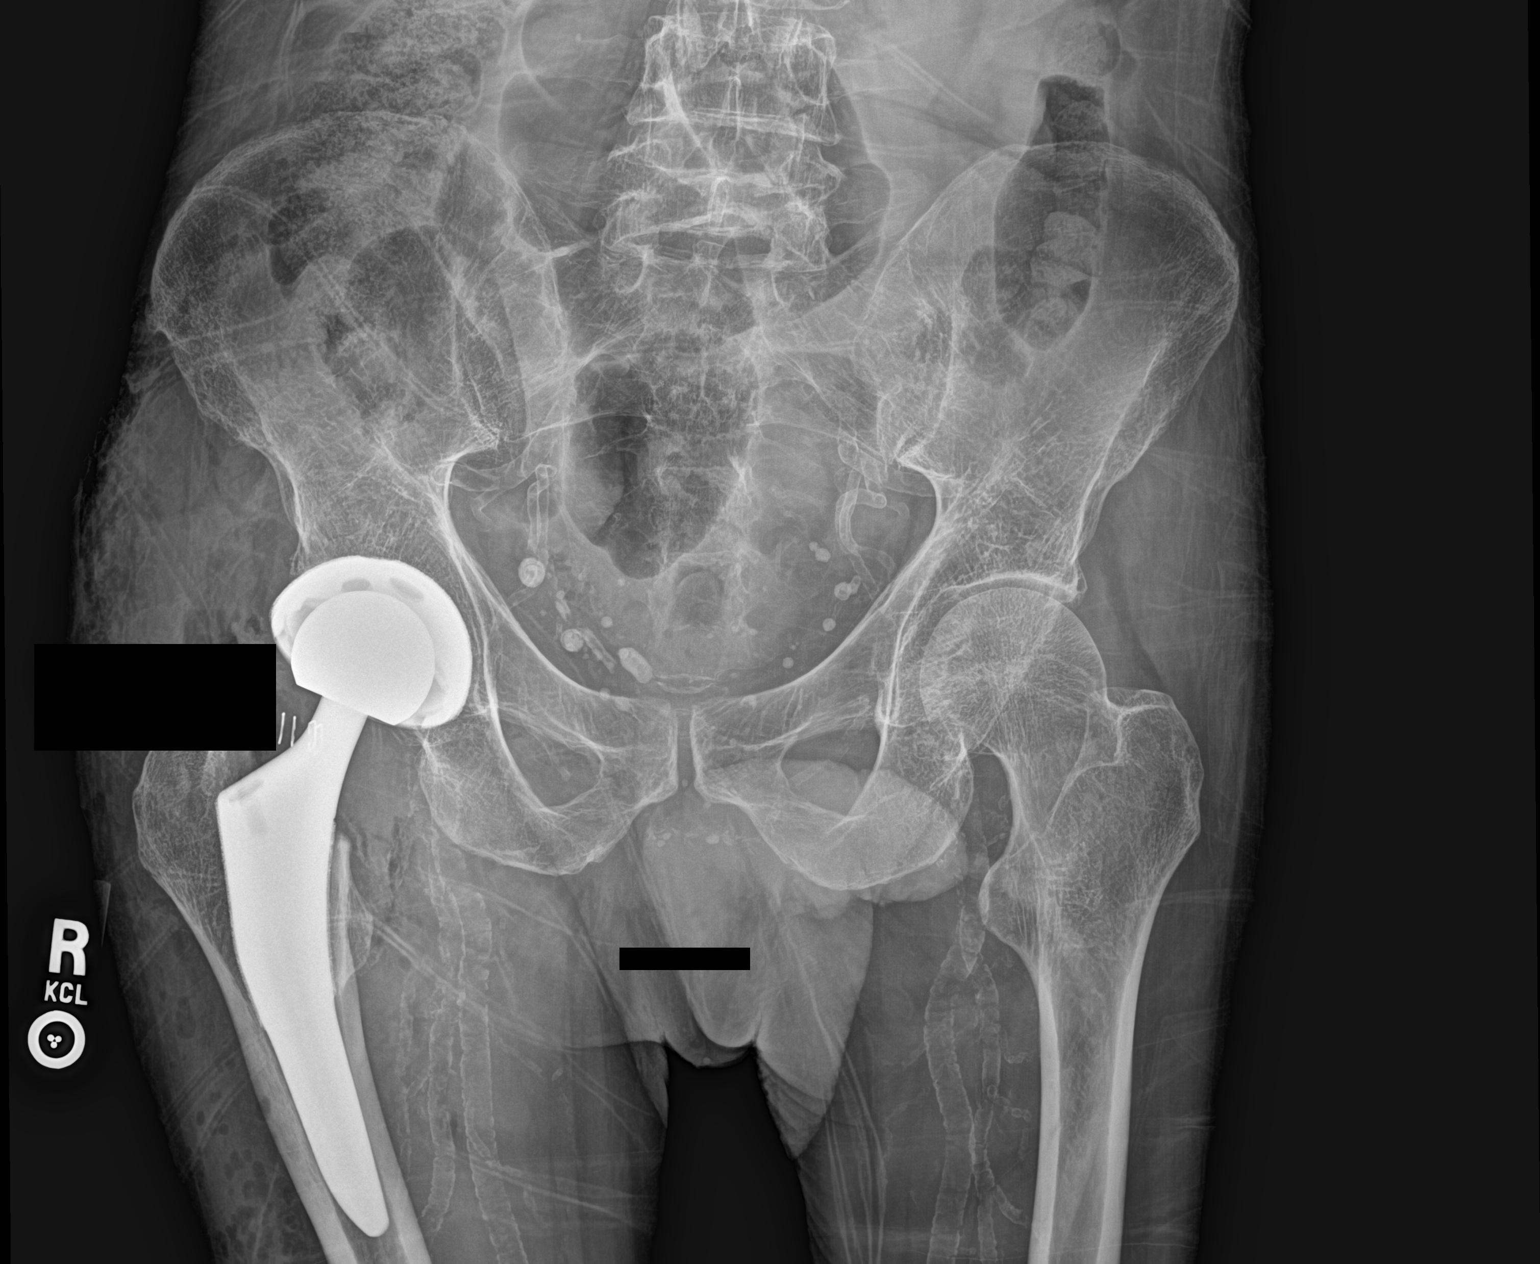

[1 of 1 positions shown; findings below may reference images not displayed]

FINDINGS: Interval right hip replacement with intact hardware and normal
alignment. Gas in the soft tissues consistent with recent surgery.
Vascular calcifications
IMPRESSION: Right hip replacement with expected postsurgical change

## 2021-06-21 IMAGING — RF DG HIP (WITH OR WITHOUT PELVIS) 2-3V*R*
1 series · 5 of 5 positions shown · non-contrast
Comparison: [DATE]

CLINICAL DATA: Right hip arthroplasty

EXAM:
DG HIP (WITH OR WITHOUT PELVIS) 2-3V RIGHT

[Series 1: unknown protocol · 0.20mm/px · 5 of 5 slices shown]
[im 1/5]
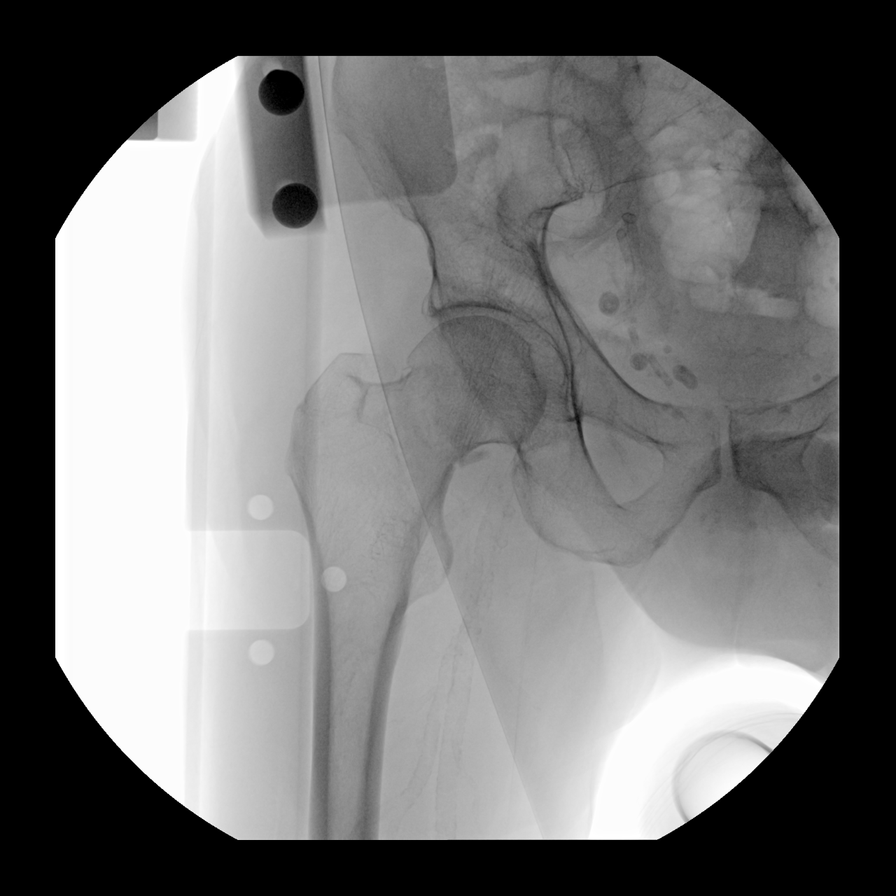
[im 2/5]
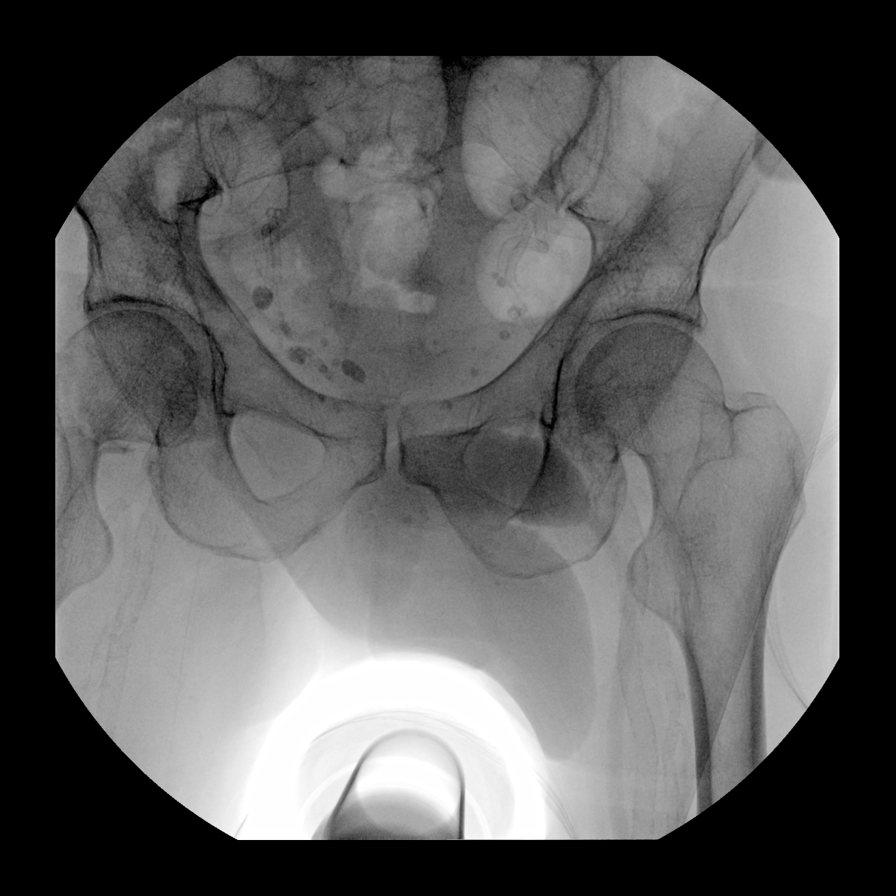
[im 3/5]
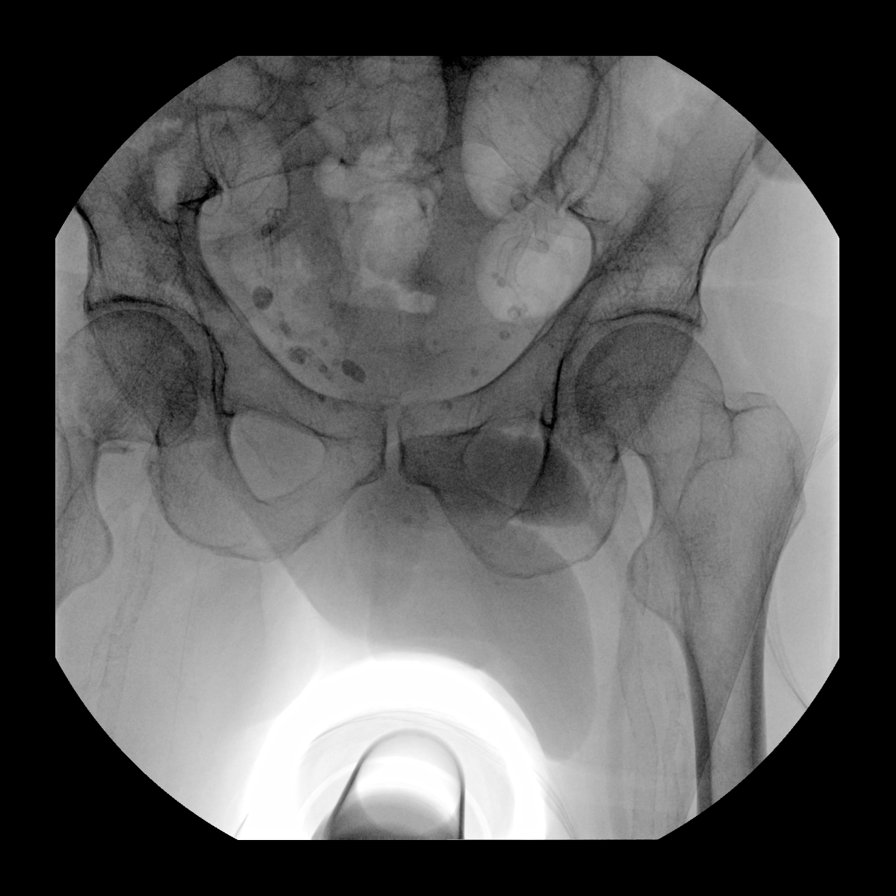
[im 4/5]
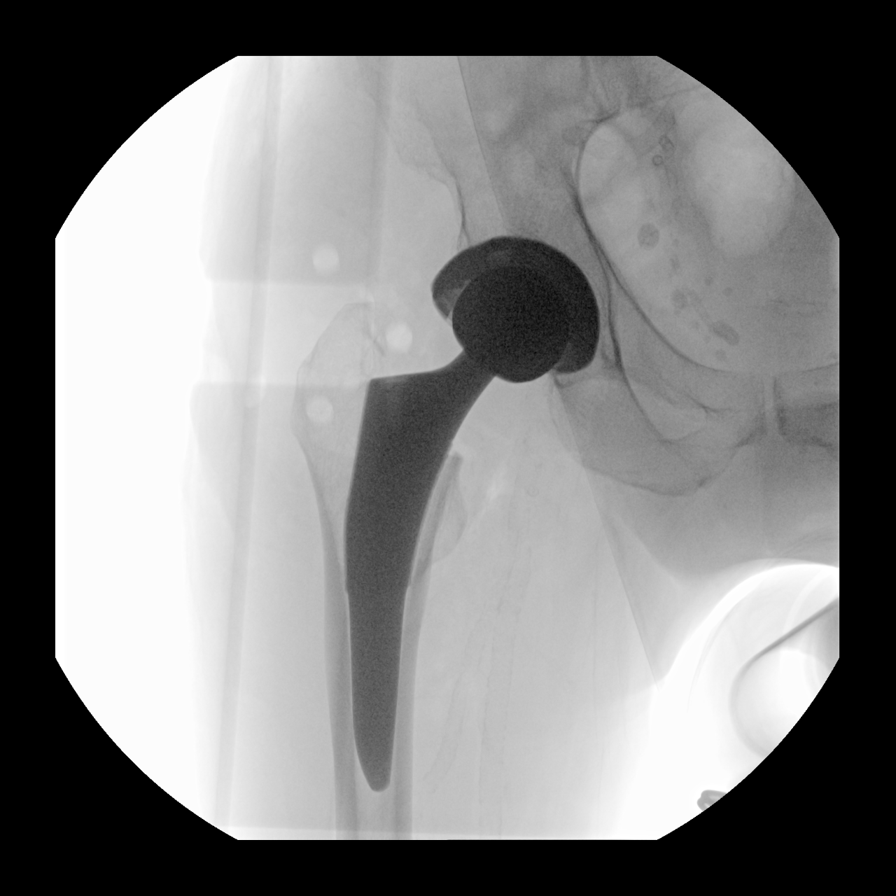
[im 5/5]
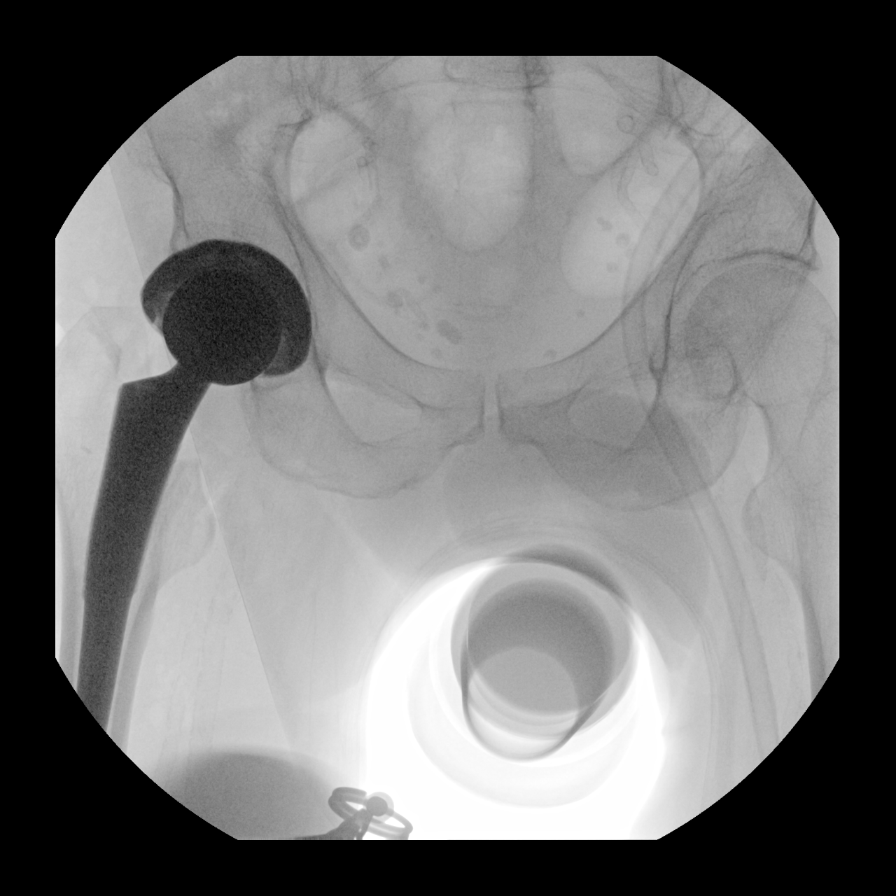

[5 of 5 positions shown; findings below may reference images not displayed]

FINDINGS: Five fluoroscopic images are obtained during the performance of the
procedure and are provided for interpretation only. A subcapital
right femoral neck fracture is again identified. Subsequent
placement of a right hip arthroplasty in the expected position
without signs of acute complication. Please refer to the operative
report.

FLUOROSCOPY TIME:  20 seconds
IMPRESSION: 1. Right hip arthroplasty as above. Please refer to the operative
report.

## 2021-06-21 IMAGING — DX DG CHEST 1V PORT
1 series · 1 of 1 positions shown · non-contrast
Comparison: [DATE]

CLINICAL DATA: Status post right hip replacement and respiratory
compromise, initial encounter

EXAM:
PORTABLE CHEST 1 VIEW

[chest ap]
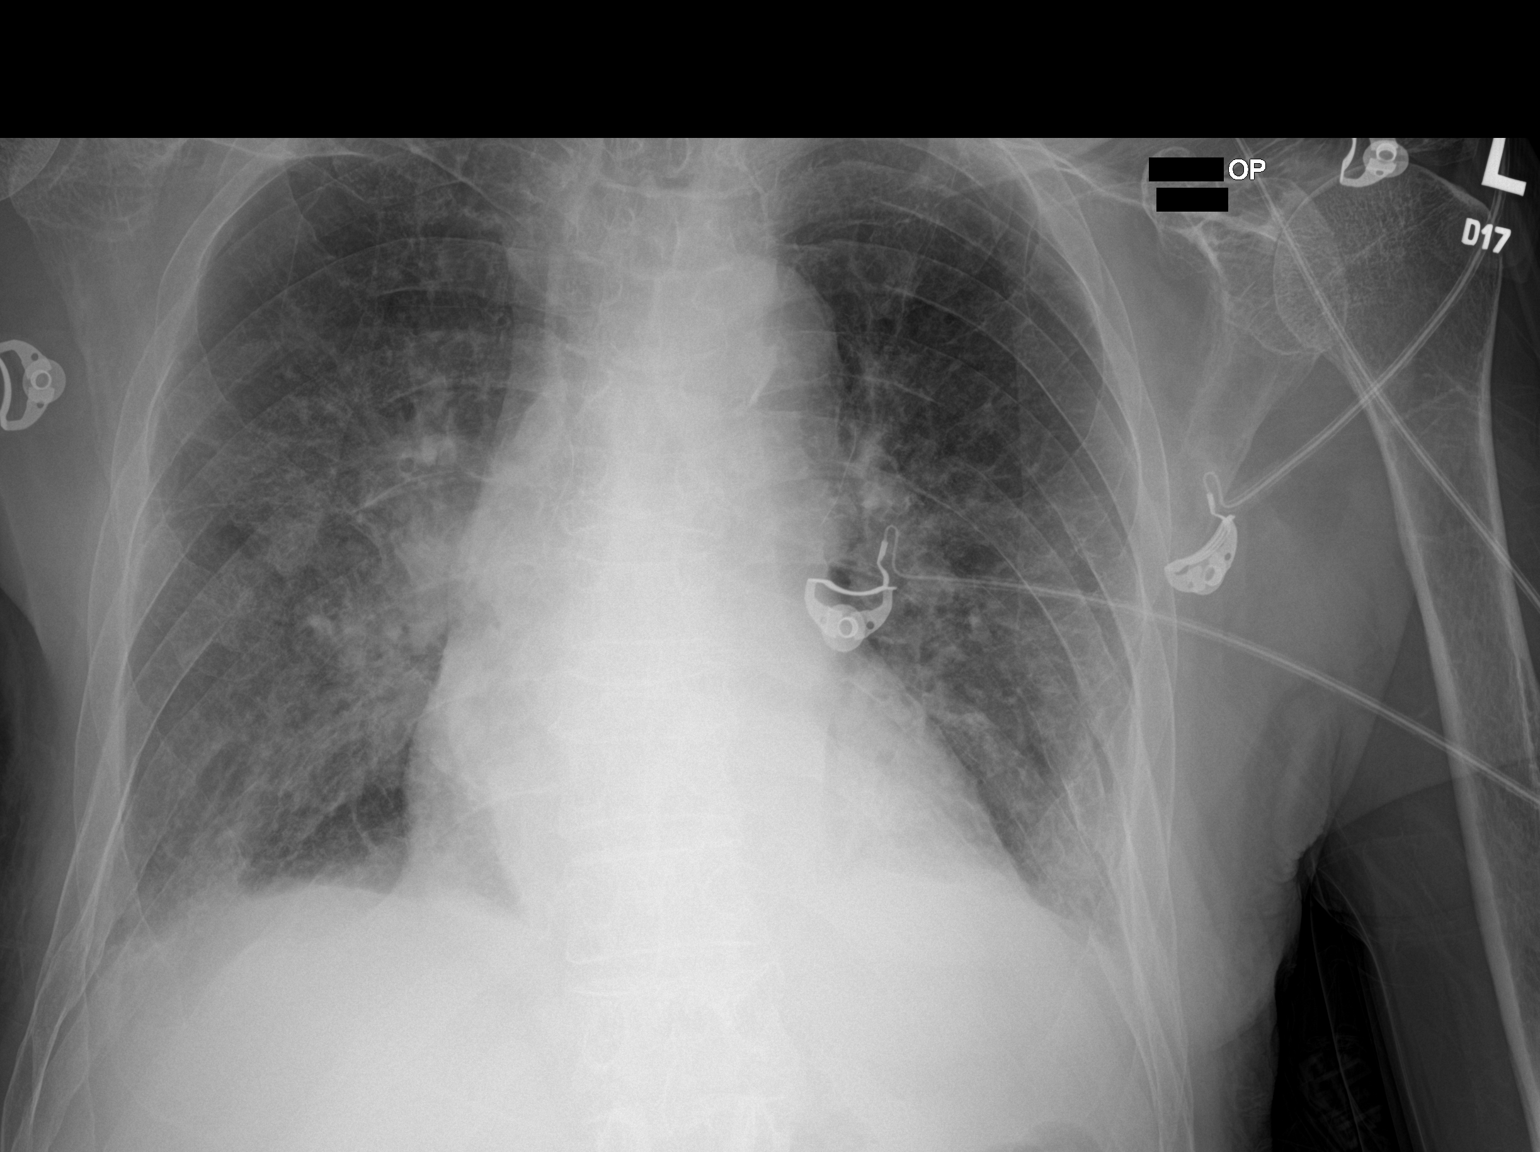

[1 of 1 positions shown; findings below may reference images not displayed]

FINDINGS: Cardiac shadow is stable. Aortic calcifications are noted. Increased
vascular congestion is noted with mild interstitial edema. Mild
basilar atelectasis is noted as well. No bony abnormality is seen.
IMPRESSION: Changes consistent with mild CHF.

## 2021-06-21 SURGERY — ARTHROPLASTY, HIP, TOTAL, ANTERIOR APPROACH
Anesthesia: General | Site: Hip | Laterality: Right

## 2021-06-21 MED ORDER — METOCLOPRAMIDE HCL 5 MG/ML IJ SOLN: 5.0000 mg | Freq: Three times a day (TID) | INTRAMUSCULAR | Status: DC | PRN

## 2021-06-21 MED ORDER — FENTANYL CITRATE (PF) 100 MCG/2ML IJ SOLN
INTRAMUSCULAR | Status: AC
  Filled 2021-06-21: qty 2

## 2021-06-21 MED ORDER — SODIUM CHLORIDE 0.9 % IR SOLN
Status: DC | PRN
  Administered 2021-06-21: 1000 mL

## 2021-06-21 MED ORDER — ONDANSETRON HCL 4 MG/2ML IJ SOLN
INTRAMUSCULAR | Status: DC | PRN
  Administered 2021-06-21: 4 mg via INTRAVENOUS

## 2021-06-21 MED ORDER — ONDANSETRON HCL 4 MG/2ML IJ SOLN: 4.0000 mg | Freq: Four times a day (QID) | INTRAMUSCULAR | Status: DC | PRN

## 2021-06-21 MED ORDER — POVIDONE-IODINE 10 % EX SWAB
2.0000 "application " | Freq: Once | CUTANEOUS | Status: AC
  Administered 2021-06-21: 2 via TOPICAL

## 2021-06-21 MED ORDER — ORAL CARE MOUTH RINSE
15.0000 mL | Freq: Two times a day (BID) | OROMUCOSAL | Status: DC
  Administered 2021-06-21 – 2021-06-27 (×11): 15 mL via OROMUCOSAL

## 2021-06-21 MED ORDER — CHLORHEXIDINE GLUCONATE CLOTH 2 % EX PADS
6.0000 | MEDICATED_PAD | Freq: Every day | CUTANEOUS | Status: DC
  Administered 2021-06-21 – 2021-06-27 (×7): 6 via TOPICAL

## 2021-06-21 MED ORDER — SODIUM CHLORIDE (PF) 0.9 % IJ SOLN
INTRAMUSCULAR | Status: AC
  Filled 2021-06-21: qty 30

## 2021-06-21 MED ORDER — OXYCODONE HCL 5 MG PO TABS: 5.0000 mg | ORAL_TABLET | Freq: Once | ORAL | Status: DC | PRN

## 2021-06-21 MED ORDER — DOCUSATE SODIUM 100 MG PO CAPS
100.0000 mg | ORAL_CAPSULE | Freq: Two times a day (BID) | ORAL | Status: DC
  Administered 2021-06-22 – 2021-06-28 (×10): 100 mg via ORAL
  Filled 2021-06-21 (×11): qty 1

## 2021-06-21 MED ORDER — ISOPROPYL ALCOHOL 70 % SOLN
Status: DC | PRN
  Administered 2021-06-21: 1 via TOPICAL

## 2021-06-21 MED ORDER — DEXAMETHASONE SODIUM PHOSPHATE 10 MG/ML IJ SOLN
INTRAMUSCULAR | Status: DC | PRN
  Administered 2021-06-21: 4 mg via INTRAVENOUS

## 2021-06-21 MED ORDER — WATER FOR IRRIGATION, STERILE IR SOLN
Status: DC | PRN
  Administered 2021-06-21: 2000 mL

## 2021-06-21 MED ORDER — PHENYLEPHRINE HCL (PRESSORS) 10 MG/ML IV SOLN
INTRAVENOUS | Status: AC
  Filled 2021-06-21: qty 2

## 2021-06-21 MED ORDER — KETOROLAC TROMETHAMINE 30 MG/ML IJ SOLN
INTRAMUSCULAR | Status: DC | PRN
  Administered 2021-06-21: 30 mg via INTRAVENOUS

## 2021-06-21 MED ORDER — ASPIRIN EC 325 MG PO TBEC
325.0000 mg | DELAYED_RELEASE_TABLET | Freq: Every day | ORAL | Status: DC
  Administered 2021-06-22 – 2021-06-28 (×7): 325 mg via ORAL
  Filled 2021-06-21 (×7): qty 1

## 2021-06-21 MED ORDER — TRANEXAMIC ACID-NACL 1000-0.7 MG/100ML-% IV SOLN
1000.0000 mg | INTRAVENOUS | Status: AC
  Administered 2021-06-21: 1000 mg via INTRAVENOUS
  Filled 2021-06-21: qty 100

## 2021-06-21 MED ORDER — CEFAZOLIN SODIUM-DEXTROSE 2-4 GM/100ML-% IV SOLN
2.0000 g | INTRAVENOUS | Status: AC
  Administered 2021-06-21: 2 g via INTRAVENOUS
  Filled 2021-06-21: qty 100

## 2021-06-21 MED ORDER — DEXMEDETOMIDINE (PRECEDEX) IN NS 20 MCG/5ML (4 MCG/ML) IV SYRINGE
PREFILLED_SYRINGE | INTRAVENOUS | Status: AC
  Filled 2021-06-21: qty 15

## 2021-06-21 MED ORDER — PHENYLEPHRINE HCL-NACL 20-0.9 MG/250ML-% IV SOLN
INTRAVENOUS | Status: DC | PRN
  Administered 2021-06-21: 70 ug/min via INTRAVENOUS

## 2021-06-21 MED ORDER — CHLORHEXIDINE GLUCONATE 0.12 % MT SOLN
15.0000 mL | OROMUCOSAL | Status: AC
  Administered 2021-06-21: 15 mL via OROMUCOSAL

## 2021-06-21 MED ORDER — CEFAZOLIN SODIUM-DEXTROSE 2-4 GM/100ML-% IV SOLN
2.0000 g | Freq: Four times a day (QID) | INTRAVENOUS | Status: AC
  Administered 2021-06-21 – 2021-06-22 (×2): 2 g via INTRAVENOUS
  Filled 2021-06-21 (×2): qty 100

## 2021-06-21 MED ORDER — TRANEXAMIC ACID-NACL 1000-0.7 MG/100ML-% IV SOLN
1000.0000 mg | Freq: Once | INTRAVENOUS | Status: AC
  Administered 2021-06-21: 1000 mg via INTRAVENOUS
  Filled 2021-06-21: qty 100

## 2021-06-21 MED ORDER — HYDROMORPHONE HCL 1 MG/ML IJ SOLN: 0.2500 mg | INTRAMUSCULAR | Status: DC | PRN

## 2021-06-21 MED ORDER — SODIUM CHLORIDE (PF) 0.9 % IJ SOLN
INTRAMUSCULAR | Status: DC | PRN
  Administered 2021-06-21: 50 mL

## 2021-06-21 MED ORDER — FLEET ENEMA 7-19 GM/118ML RE ENEM: 1.0000 | ENEMA | Freq: Once | RECTAL | Status: DC | PRN

## 2021-06-21 MED ORDER — POLYETHYLENE GLYCOL 3350 17 G PO PACK: 17.0000 g | PACK | Freq: Every day | ORAL | Status: DC | PRN

## 2021-06-21 MED ORDER — IRRISEPT - 450ML BOTTLE WITH 0.05% CHG IN STERILE WATER, USP 99.95% OPTIME
TOPICAL | Status: DC | PRN
  Administered 2021-06-21: 450 mL

## 2021-06-21 MED ORDER — SORBITOL 70 % SOLN: 30.0000 mL | Freq: Every day | Status: DC | PRN

## 2021-06-21 MED ORDER — PROPOFOL 10 MG/ML IV BOLUS
INTRAVENOUS | Status: DC | PRN
  Administered 2021-06-21: 130 mg via INTRAVENOUS

## 2021-06-21 MED ORDER — ONDANSETRON HCL 4 MG/2ML IJ SOLN: 4.0000 mg | Freq: Once | INTRAMUSCULAR | Status: DC | PRN

## 2021-06-21 MED ORDER — CHLORHEXIDINE GLUCONATE 4 % EX LIQD
60.0000 mL | Freq: Once | CUTANEOUS | Status: AC
  Administered 2021-06-21: 4 via TOPICAL
  Filled 2021-06-21: qty 60

## 2021-06-21 MED ORDER — BUPIVACAINE-EPINEPHRINE (PF) 0.25% -1:200000 IJ SOLN
INTRAMUSCULAR | Status: AC
  Filled 2021-06-21: qty 30

## 2021-06-21 MED ORDER — LACTATED RINGERS IV SOLN: INTRAVENOUS | Status: DC

## 2021-06-21 MED ORDER — SODIUM CHLORIDE (PF) 0.9 % IJ SOLN
INTRAMUSCULAR | Status: DC | PRN
  Administered 2021-06-21: 30 mL

## 2021-06-21 MED ORDER — KETOROLAC TROMETHAMINE 30 MG/ML IJ SOLN
INTRAMUSCULAR | Status: AC
  Filled 2021-06-21: qty 1

## 2021-06-21 MED ORDER — ROCURONIUM BROMIDE 10 MG/ML (PF) SYRINGE
PREFILLED_SYRINGE | INTRAVENOUS | Status: DC | PRN
  Administered 2021-06-21: 40 mg via INTRAVENOUS

## 2021-06-21 MED ORDER — SUCCINYLCHOLINE CHLORIDE 200 MG/10ML IV SOSY
PREFILLED_SYRINGE | INTRAVENOUS | Status: DC | PRN
  Administered 2021-06-21: 100 mg via INTRAVENOUS

## 2021-06-21 MED ORDER — PHENOL 1.4 % MT LIQD
1.0000 | OROMUCOSAL | Status: DC | PRN
  Filled 2021-06-21: qty 177

## 2021-06-21 MED ORDER — PHENYLEPHRINE HCL-NACL 20-0.9 MG/250ML-% IV SOLN
INTRAVENOUS | Status: DC | PRN
Start: 2021-06-21 — End: 2021-06-21

## 2021-06-21 MED ORDER — SUGAMMADEX SODIUM 200 MG/2ML IV SOLN
INTRAVENOUS | Status: DC | PRN
  Administered 2021-06-21: 100 mg via INTRAVENOUS

## 2021-06-21 MED ORDER — POVIDONE-IODINE 10 % EX SWAB: 2.0000 "application " | Freq: Once | CUTANEOUS | Status: DC

## 2021-06-21 MED ORDER — BUPIVACAINE-EPINEPHRINE 0.25% -1:200000 IJ SOLN
INTRAMUSCULAR | Status: DC | PRN
  Administered 2021-06-21: 30 mL

## 2021-06-21 MED ORDER — PHENYLEPHRINE 40 MCG/ML (10ML) SYRINGE FOR IV PUSH (FOR BLOOD PRESSURE SUPPORT)
PREFILLED_SYRINGE | INTRAVENOUS | Status: DC | PRN
  Administered 2021-06-21: 120 ug via INTRAVENOUS

## 2021-06-21 MED ORDER — OXYCODONE HCL 5 MG/5ML PO SOLN
5.0000 mg | Freq: Once | ORAL | Status: DC | PRN
Start: 2021-06-21 — End: 2021-06-21

## 2021-06-21 MED ORDER — ONDANSETRON HCL 4 MG PO TABS: 4.0000 mg | ORAL_TABLET | Freq: Four times a day (QID) | ORAL | Status: DC | PRN

## 2021-06-21 MED ORDER — FENTANYL CITRATE (PF) 100 MCG/2ML IJ SOLN
INTRAMUSCULAR | Status: DC | PRN
  Administered 2021-06-21 (×4): 50 ug via INTRAVENOUS

## 2021-06-21 MED ORDER — METOCLOPRAMIDE HCL 5 MG PO TABS: 5.0000 mg | ORAL_TABLET | Freq: Three times a day (TID) | ORAL | Status: DC | PRN

## 2021-06-21 MED ORDER — MENTHOL 3 MG MT LOZG
1.0000 | LOZENGE | OROMUCOSAL | Status: DC | PRN
  Filled 2021-06-21: qty 9

## 2021-06-21 MED ORDER — SENNA 8.6 MG PO TABS
1.0000 | ORAL_TABLET | Freq: Two times a day (BID) | ORAL | Status: DC
  Administered 2021-06-21 – 2021-06-28 (×11): 8.6 mg via ORAL
  Filled 2021-06-21 (×13): qty 1

## 2021-06-21 MED ORDER — LIDOCAINE 2% (20 MG/ML) 5 ML SYRINGE
INTRAMUSCULAR | Status: DC | PRN
  Administered 2021-06-21: 60 mg via INTRAVENOUS

## 2021-06-21 SURGICAL SUPPLY — 67 items
ACETAB CUP W/GRIPTION 54 (Plate) ×2 IMPLANT
ADH SKN CLS APL DERMABOND .7 (GAUZE/BANDAGES/DRESSINGS) ×1
APL PRP STRL LF DISP 70% ISPRP (MISCELLANEOUS) ×1
BAG COUNTER SPONGE SURGICOUNT (BAG) IMPLANT
BAG DECANTER FOR FLEXI CONT (MISCELLANEOUS) IMPLANT
BAG SPEC THK2 15X12 ZIP CLS (MISCELLANEOUS)
BAG SPNG CNTER NS LX DISP (BAG)
BAG ZIPLOCK 12X15 (MISCELLANEOUS) IMPLANT
CHLORAPREP W/TINT 26 (MISCELLANEOUS) ×2 IMPLANT
COVER PERINEAL POST (MISCELLANEOUS) ×2 IMPLANT
COVER SURGICAL LIGHT HANDLE (MISCELLANEOUS) ×2 IMPLANT
CUP ACETAB W/GRIPTION 54 (Plate) IMPLANT
DECANTER SPIKE VIAL GLASS SM (MISCELLANEOUS) ×2 IMPLANT
DERMABOND ADVANCED (GAUZE/BANDAGES/DRESSINGS) ×1
DERMABOND ADVANCED .7 DNX12 (GAUZE/BANDAGES/DRESSINGS) ×2 IMPLANT
DRAPE IMP U-DRAPE 54X76 (DRAPES) ×2 IMPLANT
DRAPE SHEET LG 3/4 BI-LAMINATE (DRAPES) ×6 IMPLANT
DRAPE STERI IOBAN 125X83 (DRAPES) ×2 IMPLANT
DRAPE U-SHAPE 47X51 STRL (DRAPES) ×4 IMPLANT
DRESSING AQUACEL AG SP 3.5X10 (GAUZE/BANDAGES/DRESSINGS) IMPLANT
DRSG AQUACEL AG ADV 3.5X10 (GAUZE/BANDAGES/DRESSINGS) ×2 IMPLANT
DRSG AQUACEL AG SP 3.5X10 (GAUZE/BANDAGES/DRESSINGS) ×2
ELECT REM PT RETURN 15FT ADLT (MISCELLANEOUS) ×2 IMPLANT
GAUZE SPONGE 4X4 12PLY STRL (GAUZE/BANDAGES/DRESSINGS) ×2 IMPLANT
GLOVE SRG 8 PF TXTR STRL LF DI (GLOVE) ×1 IMPLANT
GLOVE SURG ENC MOIS LTX SZ8.5 (GLOVE) ×4 IMPLANT
GLOVE SURG ENC TEXT LTX SZ7.5 (GLOVE) ×4 IMPLANT
GLOVE SURG UNDER POLY LF SZ8 (GLOVE) ×2
GLOVE SURG UNDER POLY LF SZ8.5 (GLOVE) ×2 IMPLANT
GOWN SPEC L3 XXLG W/TWL (GOWN DISPOSABLE) ×2 IMPLANT
GOWN STRL REUS W/TWL XL LVL3 (GOWN DISPOSABLE) ×2 IMPLANT
HANDPIECE INTERPULSE COAX TIP (DISPOSABLE) ×2
HEAD CERAMIC 36 PLUS5 (Hips) ×1 IMPLANT
HOLDER FOLEY CATH W/STRAP (MISCELLANEOUS) ×2 IMPLANT
HOOD PEEL AWAY FLYTE STAYCOOL (MISCELLANEOUS) ×8 IMPLANT
IV NS 1000ML (IV SOLUTION) ×2
IV NS 1000ML BAXH (IV SOLUTION) IMPLANT
JET LAVAGE IRRISEPT WOUND (IRRIGATION / IRRIGATOR) ×2
LAVAGE JET IRRISEPT WOUND (IRRIGATION / IRRIGATOR) ×1 IMPLANT
LINER NEUTRAL 36ID 54OD (Liner) ×1 IMPLANT
MANIFOLD NEPTUNE II (INSTRUMENTS) ×2 IMPLANT
MARKER SKIN DUAL TIP RULER LAB (MISCELLANEOUS) ×2 IMPLANT
NDL SAFETY ECLIPSE 18X1.5 (NEEDLE) ×1 IMPLANT
NDL SPNL 18GX3.5 QUINCKE PK (NEEDLE) ×1 IMPLANT
NEEDLE HYPO 18GX1.5 SHARP (NEEDLE) ×2
NEEDLE SPNL 18GX3.5 QUINCKE PK (NEEDLE) ×2 IMPLANT
NS IRRIG 1000ML POUR BTL (IV SOLUTION) ×1 IMPLANT
PACK ANTERIOR HIP CUSTOM (KITS) ×2 IMPLANT
PENCIL SMOKE EVACUATOR (MISCELLANEOUS) IMPLANT
SAW OSC TIP CART 19.5X105X1.3 (SAW) ×2 IMPLANT
SEALER BIPOLAR AQUA 6.0 (INSTRUMENTS) ×2 IMPLANT
SET HNDPC FAN SPRY TIP SCT (DISPOSABLE) ×1 IMPLANT
STAPLER VISISTAT 35W (STAPLE) ×1 IMPLANT
STEM TRI LOC BPS SZ6 W GRIPTON IMPLANT
SUT MNCRL AB 3-0 PS2 18 (SUTURE) ×2 IMPLANT
SUT MNCRL AB 4-0 PS2 18 (SUTURE) ×2 IMPLANT
SUT MON AB 2-0 CT1 36 (SUTURE) ×4 IMPLANT
SUT STRATAFIX PDO 1 14 VIOLET (SUTURE) ×2
SUT STRATFX PDO 1 14 VIOLET (SUTURE) ×1
SUT VIC AB 2-0 CT1 27 (SUTURE) ×2
SUT VIC AB 2-0 CT1 TAPERPNT 27 (SUTURE) ×1 IMPLANT
SUTURE STRATFX PDO 1 14 VIOLET (SUTURE) ×1 IMPLANT
SYR 3ML LL SCALE MARK (SYRINGE) ×2 IMPLANT
TRAY FOLEY MTR SLVR 16FR STAT (SET/KITS/TRAYS/PACK) IMPLANT
TRI LOC BPS SZ 6 W GRIPTON ×2 IMPLANT
TUBE SUCTION HIGH CAP CLEAR NV (SUCTIONS) ×2 IMPLANT
WATER STERILE IRR 1000ML POUR (IV SOLUTION) ×2 IMPLANT

## 2021-06-21 NOTE — Anesthesia Postprocedure Evaluation (Signed)
Anesthesia Post Note  Patient: Ralph Jackson  Procedure(s) Performed: TOTAL HIP ARTHROPLASTY ANTERIOR APPROACH (Right: Hip)     Patient location during evaluation: PACU Anesthesia Type: General Level of consciousness: awake and alert Pain management: pain level controlled Vital Signs Assessment: post-procedure vital signs reviewed and stable Respiratory status: spontaneous breathing, nonlabored ventilation, respiratory function stable and patient connected to nasal cannula oxygen Cardiovascular status: blood pressure returned to baseline and stable Postop Assessment: no apparent nausea or vomiting Anesthetic complications: no   No notable events documented.  Last Vitals:  Vitals:   06/21/21 1358 06/21/21 1851  BP: 115/74   Pulse: 74   Resp: (!) 24   Temp: 36.8 C   SpO2: 93% 91%    Last Pain:  Vitals:   06/21/21 1522  TempSrc:   PainSc: 9                  Mercedez Boule S

## 2021-06-21 NOTE — Anesthesia Preprocedure Evaluation (Signed)
Anesthesia Evaluation  Patient identified by MRN, date of birth, ID band Patient awake    Reviewed: Allergy & Precautions, NPO status , Patient's Chart, lab work & pertinent test results  Airway Mallampati: II  TM Distance: >3 FB Neck ROM: Full    Dental no notable dental hx.    Pulmonary neg pulmonary ROS,    Pulmonary exam normal breath sounds clear to auscultation       Cardiovascular hypertension, Pt. on medications and Pt. on home beta blockers Normal cardiovascular exam Rhythm:Regular Rate:Normal     Neuro/Psych negative neurological ROS  negative psych ROS   GI/Hepatic Neg liver ROS, Severe malnutrition   Endo/Other  negative endocrine ROS  Renal/GU negative Renal ROS  negative genitourinary   Musculoskeletal negative musculoskeletal ROS (+)   Abdominal   Peds negative pediatric ROS (+)  Hematology  (+) anemia ,   Anesthesia Other Findings   Reproductive/Obstetrics negative OB ROS                             Anesthesia Physical Anesthesia Plan  ASA: 3  Anesthesia Plan: General   Post-op Pain Management:    Induction: Intravenous  PONV Risk Score and Plan: 2 and Ondansetron, Dexamethasone and Treatment may vary due to age or medical condition  Airway Management Planned: Oral ETT  Additional Equipment:   Intra-op Plan:   Post-operative Plan: Extubation in OR  Informed Consent: I have reviewed the patients History and Physical, chart, labs and discussed the procedure including the risks, benefits and alternatives for the proposed anesthesia with the patient or authorized representative who has indicated his/her understanding and acceptance.     Dental advisory given  Plan Discussed with: CRNA and Surgeon  Anesthesia Plan Comments:         Anesthesia Quick Evaluation

## 2021-06-21 NOTE — Interval H&P Note (Signed)
History and Physical Interval Note:  06/21/2021 3:38 PM  Ralph Jackson  has presented today for surgery, with the diagnosis of right femoral neck fracture.  The various methods of treatment have been discussed with the patient and family. After consideration of risks, benefits and other options for treatment, the patient has consented to  Procedure(s): TOTAL HIP ARTHROPLASTY ANTERIOR APPROACH (Right) as a surgical intervention.  The patient's history has been reviewed, patient examined, no change in status, stable for surgery.  I have reviewed the patient's chart and labs.  Questions were answered to the patient's satisfaction.    The risks, benefits, and alternatives were discussed with the patient. There are risks associated with the surgery including, but not limited to, problems with anesthesia (death), infection, instability (giving out of the joint), dislocation, differences in leg length/angulation/rotation, fracture of bones, loosening or failure of implants, hematoma (blood accumulation) which may require surgical drainage, blood clots, pulmonary embolism, nerve injury (foot drop and lateral thigh numbness), and blood vessel injury. The patient understands these risks and elects to proceed.    Ralph Jackson

## 2021-06-21 NOTE — Progress Notes (Signed)
Daughter Victorino Dike came to [phone] and retrieved patients belongings. Daughter will be waiting in central waiting area for updates.  Spoke to JP, RN in PACU and made aware of family waiting in waiting area.

## 2021-06-21 NOTE — Discharge Instructions (Signed)
? ?Dr. Rickita Forstner ?Joint Replacement Specialist ?[facility] ?[address] ?[place], [place] [address] ?[phone] ? ? ?TOTAL HIP REPLACEMENT POSTOPERATIVE DIRECTIONS ? ? ? ?Hip Rehabilitation, Guidelines Following Surgery  ? ?WEIGHT BEARING ?Weight bearing as tolerated with assist device (walker, cane, etc) as directed, use it as long as suggested by your surgeon or therapist, typically at least 4-6 weeks. ? ?The results of a hip operation are greatly improved after range of motion and muscle strengthening exercises. Follow all safety measures which are given to protect your hip. If any of these exercises cause increased pain or swelling in your joint, decrease the amount until you are comfortable again. Then slowly increase the exercises. Call your caregiver if you have problems or questions.  ? ?HOME CARE INSTRUCTIONS  ?Most of the following instructions are designed to prevent the dislocation of your new hip.  ?Remove items at home which could result in a fall. This includes throw rugs or furniture in walking pathways.  ?Continue medications as instructed at time of discharge. ?You may have some home medications which will be placed on hold until you complete the course of blood thinner medication. ?You may start showering once you are discharged home. Do not remove your dressing. ?Do not put on socks or shoes without following the instructions of your caregivers.   ?Sit on chairs with arms. Use the chair arms to help push yourself up when arising.  ?Arrange for the use of a toilet seat elevator so you are not sitting low.  ?Walk with walker as instructed.  ?You may resume a sexual relationship in one month or when given the OK by your caregiver.  ?Use walker as long as suggested by your caregivers.  ?You may put full weight on your legs and walk as much as is comfortable. ?Avoid periods of inactivity such as sitting longer than an hour when not asleep. This helps prevent blood  clots.  ?You may return to work once you are cleared by your surgeon.  ?Do not drive a car for 6 weeks or until released by your surgeon.  ?Do not drive while taking narcotics.  ?Wear elastic stockings for two weeks following surgery during the day but you may remove then at night.  ?Make sure you keep all of your appointments after your operation with all of your doctors and caregivers. You should call the office at the above phone number and make an appointment for approximately two weeks after the date of your surgery. ?Please pick up a stool softener and laxative for home use as long as you are requiring pain medications. ?ICE to the affected hip every three hours for 30 minutes at a time and then as needed for pain and swelling. Continue to use ice on the hip for pain and swelling from surgery. You may notice swelling that will progress down to the foot and ankle.  This is normal after surgery.  Elevate the leg when you are not up walking on it.   ?It is important for you to complete the blood thinner medication as prescribed by your doctor. ?Continue to use the breathing machine which will help keep your temperature down.  It is common for your temperature to cycle up and down following surgery, especially at night when you are not up moving around and exerting yourself.  The breathing machine keeps your lungs expanded and your temperature down. ? ?RANGE OF MOTION AND STRENGTHENING EXERCISES  ?These exercises are designed to help you   keep full movement of your hip joint. Follow your caregiver's or physical therapist's instructions. Perform all exercises about fifteen times, three times per day or as directed. Exercise both hips, even if you have had only one joint replacement. These exercises can be done on a training (exercise) mat, on the floor, on a table or on a bed. Use whatever works the best and is most comfortable for you. Use music or television while you are exercising so that the exercises are a  pleasant break in your day. This will make your life better with the exercises acting as a break in routine you can look forward to.  ?Lying on your back, slowly slide your foot toward your buttocks, raising your knee up off the floor. Then slowly slide your foot back down until your leg is straight again.  ?Lying on your back spread your legs as far apart as you can without causing discomfort.  ?Lying on your side, raise your upper leg and foot straight up from the floor as far as is comfortable. Slowly lower the leg and repeat.  ?Lying on your back, tighten up the muscle in the front of your thigh (quadriceps muscles). You can do this by keeping your leg straight and trying to raise your heel off the floor. This helps strengthen the largest muscle supporting your knee.  ?Lying on your back, tighten up the muscles of your buttocks both with the legs straight and with the knee bent at a comfortable angle while keeping your heel on the floor.  ? ?SKILLED REHAB INSTRUCTIONS: ?If the patient is transferred to a skilled rehab facility following release from the hospital, a list of the current medications will be sent to the facility for the patient to continue.  When discharged from the skilled rehab facility, please have the facility set up the patient's Home Health Physical Therapy prior to being released. Also, the skilled facility will be responsible for providing the patient with their medications at time of release from the facility to include their pain medication and their blood thinner medication. If the patient is still at the rehab facility at time of the two week follow up appointment, the skilled rehab facility will also need to assist the patient in arranging follow up appointment in our office and any transportation needs. ? ?POST-OPERATIVE OPIOID TAPER INSTRUCTIONS: ?It is important to wean off of your opioid medication as soon as possible. If you do not need pain medication after your surgery it is ok  to stop day one. ?Opioids include: ?Codeine, Hydrocodone(Norco, Vicodin), Oxycodone(Percocet, oxycontin) and hydromorphone amongst others.  ?Long term and even short term use of opiods can cause: ?Increased pain response ?Dependence ?Constipation ?Depression ?Respiratory depression ?And more.  ?Withdrawal symptoms can include ?Flu like symptoms ?Nausea, vomiting ?And more ?Techniques to manage these symptoms ?Hydrate well ?Eat regular healthy meals ?Stay active ?Use relaxation techniques(deep breathing, meditating, yoga) ?Do Not substitute Alcohol to help with tapering ?If you have been on opioids for less than two weeks and do not have pain than it is ok to stop all together.  ?Plan to wean off of opioids ?This plan should start within one week post op of your joint replacement. ?Maintain the same interval or time between taking each dose and first decrease the dose.  ?Cut the total daily intake of opioids by one tablet each day ?Next start to increase the time between doses. ?The last dose that should be eliminated is the evening dose.  ? ? ?MAKE   SURE YOU:  ?Understand these instructions.  ?Will watch your condition.  ?Will get help right away if you are not doing well or get worse. ? ?Pick up stool softner and laxative for home use following surgery while on pain medications. ?Do not remove your dressing. ?The dressing is waterproof--it is OK to take showers. ?Continue to use ice for pain and swelling after surgery. ?Do not use any lotions or creams on the incision until instructed by your surgeon. ?Total Hip Protocol. ? ?

## 2021-06-21 NOTE — Op Note (Signed)
OPERATIVE REPORT  SURGEON: Samson Frederic, MD   ASSISTANT: Barrie Dunker, PA-C  PREOPERATIVE DIAGNOSIS: Displaced Right femoral neck fracture.   POSTOPERATIVE DIAGNOSIS: Displaced Right femoral neck fracture.   PROCEDURE: Right total hip arthroplasty, anterior approach.   IMPLANTS: DePuy Tri Lock stem, size 6, hi offset. DePuy Pinnacle Cup, size 54 mm. DePuy Altrx liner, size 36 by 54 mm, neutral. DePuy Biolox ceramic head ball, size 36 + 5 mm.  ANESTHESIA:  General  ANTIBIOTICS: 2g ancef.  ESTIMATED BLOOD LOSS:-200 mL    DRAINS: None.  COMPLICATIONS: None   CONDITION: PACU - hemodynamically stable.   BRIEF CLINICAL NOTE: Ralph Jackson is a 75 y.o. male with a displaced Right femoral neck fracture. The patient was admitted to the hospitalist service and underwent perioperative risk stratification and medical optimization. The risks, benefits, and alternatives to total hip arthroplasty were explained, and the patient elected to proceed.  PROCEDURE IN DETAIL: The patient was taken to the operating room and general anesthesia was induced on the hospital bed.  A foley catheter was placed. The patient was then positioned on the Hana table.  All bony prominences were well padded.  The hip was prepped and draped in the normal sterile surgical fashion.  A time-out was called verifying side and site of surgery. Antibiotics were given within 60 minutes of beginning the procedure.   Bikini incision was made, and the direct anterior approach to the hip was performed through the Hueter interval.  Lateral femoral circumflex vessels were treated with the Auqumantys. The anterior capsule was exposed and an inverted T capsulotomy was made.  Fracture hematoma was encountered and evacuated. The patient was found to have a comminuted Right subcapital femoral neck fracture.  I freshened the femoral neck cut with a saw.  I removed the femoral neck fragment.  A corkscrew was placed into the head and  the head was removed.  This was passed to the back table and was measured. The pubofemoral ligament was released subperiosteally to the lesser trochanter.  Acetabular exposure was achieved, and the pulvinar and labrum were excised. Sequential reaming of the acetabulum was then performed up to a size 53 mm reamer under direct visulization. A 54 mm cup was then opened and impacted into place at approximately 40 degrees of abduction and 20 degrees of anteversion. The final polyethylene liner was impacted into place and acetabular osteophytes were removed.    I then gained femoral exposure taking care to protect the abductors and greater trochanter.  This was performed using standard external rotation, extension, and adduction.  A cookie cutter was used to enter the femoral canal, and then the femoral canal finder was placed.  Sequential broaching was performed up to a size 6.  Calcar planer was used on the femoral neck remnant.  I placed a hi offset neck and a trial head ball.  The hip was reduced.  Leg lengths and offset were checked fluoroscopically.  The hip was dislocated and trial components were removed.  The final implants were placed, and the hip was reduced.  Fluoroscopy was used to confirm component position and leg lengths.  At 90 degrees of external rotation and full extension, the hip was stable to an anterior directed force.   The wound was copiously irrigated with Irrisept solution and normal saline using pule lavage.  Marcaine solution was injected into the periarticular soft tissue.  The wound was closed in layers using #1 Stratafix for the fascia, 2-0 Vicryl for the subcutaneous fat,  2-0 Monocryl for the deep dermal layer, and staples + Dermabond for the skin.  Once the glue was fully dried, an Aquacell Ag dressing was applied.  The patient was transported to the recovery room in stable condition.  Sponge, needle, and instrument counts were correct at the end of the case x2.  The patient  tolerated the procedure well and there were no known complications.  Please note that a surgical assistant was a medical necessity for this procedure to perform it in a safe and expeditious manner. Assistant was necessary to provide appropriate retraction of vital neurovascular structures, to prevent femoral fracture, and to allow for anatomic placement of the prosthesis.

## 2021-06-21 NOTE — Consult Note (Signed)
NAME:  Ralph Jackson, MRN:  [id number], DOB:  1945/12/14, LOS: 2 ADMISSION DATE:  06/19/2021, CONSULTATION DATE:  06/21/21 REFERRING MD:  Linna Caprice, CHIEF COMPLAINT:  SOB   History of Present Illness:  75 year old man without much PMH who presented after mechanical fall found to have R hip fracture.  This evening underwent R THA.  In PACU extubated but had quickly escalating O2 needs for which anesthesia recommended PCCM eval.  At my time of evaluation patient in no distress saturating high 90s on NRB after using spirometer.  History limited by patient's hearing loss: ROS I was able to get-  no chest pain, cough, chills, fever, sputum production, LE swelling, N/V/D.  Pertinent  Medical History  HTN  Significant Hospital Events: Including procedures, antibiotic start and stop dates in addition to other pertinent events   10/20 evening: R THA, hypoxemia in PACU  Interim History / Subjective:  Consulted  Objective   Blood pressure 107/64, pulse 93, temperature 98.6 F (37 C), resp. rate 20, height 5\' 1"  (1.549 m), weight 45.4 kg, SpO2 92 %.    FiO2 (%):  [0 %] 0 %   Intake/Output Summary (Last 24 hours) at 06/21/2021 2121 Last data filed at 06/21/2021 1930 Gross per 24 hour  Intake 3377.5 ml  Output 790 ml  Net 2587.5 ml   Filed Weights   06/20/21 0006 06/21/21 1458  Weight: 45.4 kg 45.4 kg    Examination: General: frail thin elderly man lying in PACU bed HENT: MMM, trachea midline, +temporal wasting Lungs: diminished, occasional crackles, RR around 16, no accessory muscle use Cardiovascular: Regular, ext warm Abdomen: Soft, +BS Extremities: No edema Neuro: Moves all 4 ext, hard of hearing Skin: pale, surgical site wrapped  CXR being done personally reviewed, maybe mild CHF?  Nothing alarming  Resolved Hospital Problem list   N/a  Assessment & Plan:  Postop hypoxemia- no s/s of VTE or fat embolus.   Given how rapidly improved, suspicion here is probably  de-recruitment and possibly mild fluid overload.  - Fine for stepdown under TRH - Encourage IS - Wean O2 to sats > 90% - Mobility per ortho - If persistently hypoxemic in AM, consider dose of lasix and LE duplex  Best Practice (right click and "Reselect all SmartList Selections" daily)  Per primary  Labs   CBC: Recent Labs  Lab 06/19/21 2205 06/21/21 0401  WBC 16.8* 13.8*  NEUTROABS 14.0*  --   HGB 12.8* 11.5*  HCT 38.9* 35.0*  MCV 93.1 93.8  PLT 266 199    Basic Metabolic Panel: Recent Labs  Lab 06/19/21 2205 06/21/21 0401  NA 138 138  K 3.8 4.7  CL 102 107  CO2 25 27  GLUCOSE 133* 109*  BUN 18 18  CREATININE 1.00 0.63  CALCIUM 8.8* 7.7*   GFR: Estimated Creatinine Clearance: 52 mL/min (by C-G formula based on SCr of 0.63 mg/dL). Recent Labs  Lab 06/19/21 2205 06/21/21 0401  WBC 16.8* 13.8*    Liver Function Tests: Recent Labs  Lab 06/21/21 0401  AST 20  ALT 14  ALKPHOS 53  BILITOT 0.4  PROT 5.8*  ALBUMIN 3.1*   No results for input(s): LIPASE, AMYLASE in the last 168 hours. No results for input(s): AMMONIA in the last 168 hours.  ABG    Component Value Date/Time   HCO3 30.4 (H) 07/27/2007 1637   TCO2 31 05/01/2010 0137     Coagulation Profile: Recent Labs  Lab 06/19/21 2205  INR  0.9    Cardiac Enzymes: No results for input(s): CKTOTAL, CKMB, CKMBINDEX, TROPONINI in the last 168 hours.  HbA1C: No results found for: HGBA1C  CBG: No results for input(s): GLUCAP in the last 168 hours.  Review of Systems:   As per HPI  Past Medical History:  He,  has a past medical history of Depression, ETOH abuse, Hypertension, and Urinary tract infection.   Surgical History:  History reviewed. No pertinent surgical history.   Social History:   reports that he has never smoked. He uses smokeless tobacco. He reports that he does not currently use alcohol after a past usage of about 6.0 standard drinks per week. He reports that he does not  use drugs.   Family History:  His family history is negative for Osteoporosis.   Allergies No Known Allergies   Home Medications  Prior to Admission medications   Medication Sig Start Date End Date Taking? Authorizing Provider  escitalopram (LEXAPRO) 20 MG tablet Take 20 mg by mouth daily. 05/19/21  Yes [provider]  levothyroxine (SYNTHROID) 50 MCG tablet Take 50 mcg by mouth daily. 05/21/21  Yes [provider]  loratadine (CLARITIN) 10 MG tablet Take 1 tablet (10 mg total) by mouth daily. 12/12/20  Yes Wieters, Hallie C, PA-C  metoprolol tartrate (LOPRESSOR) 25 MG tablet Take 25 mg by mouth 2 (two) times daily.  03/01/15  Yes [provider]  Multiple Vitamins-Minerals (ONE-A-DAY MENS 50+) TABS Take 1 tablet by mouth daily.   Yes [provider]  omeprazole (PRILOSEC) 20 MG capsule Take 20 mg by mouth daily. 05/21/21  Yes [provider]  pravastatin (PRAVACHOL) 10 MG tablet Take 10 mg by mouth at bedtime. 11/26/15  Yes [provider]  PROLIA 60 MG/ML SOSY injection Inject 60 mg into the skin See admin instructions. Every 6 months 06/12/21  Yes [provider]

## 2021-06-21 NOTE — Anesthesia Procedure Notes (Signed)
Procedure Name: Intubation Date/Time: 06/21/2021 4:58 PM Performed by: Cleda Daub, CRNA Pre-anesthesia Checklist: Patient identified, Emergency Drugs available, Suction available and Patient being monitored Patient Re-evaluated:Patient Re-evaluated prior to induction Oxygen Delivery Method: Circle system utilized Preoxygenation: Pre-oxygenation with 100% oxygen Induction Type: IV induction Ventilation: Mask ventilation without difficulty Laryngoscope Size: Mac and 3 Grade View: Grade I Tube type: Oral Tube size: 7.5 mm Number of attempts: 1 Airway Equipment and Method: Stylet and Oral airway Placement Confirmation: ETT inserted through vocal cords under direct vision, positive ETCO2 and breath sounds checked- equal and bilateral Secured at: 21 cm Tube secured with: Tape Dental Injury: Teeth and Oropharynx as per pre-operative assessment

## 2021-06-21 NOTE — Progress Notes (Signed)
eLink Physician-Brief Progress Note Patient Name: Ralph Jackson DOB: 09-15-1945 MRN: [id number]   Date of Service  06/21/2021  HPI/Events of Note  Patient s/p hip surgery, developed acute respiratory distress in the PACU, improved following use of incentive spirometer. On camera he appears comfortable at this time.  eICU Interventions  Encourage IS and Acapella use, check BNP to estimate volume status, New Patient Evaluation.        Migdalia Dk 06/21/2021, 10:37 PM

## 2021-06-21 NOTE — Progress Notes (Signed)
PROGRESS NOTE    Ralph Jackson  HFW:[id number] DOB: 1946-01-03 DOA: 06/19/2021 PCP: Ronnald Collum    Brief Narrative:  75 y.o. male with medical history significant of prior EtOH abuse, in remission for several years now. Pt reportedly was taking out trash, carying boxes and couldn't see steps.  Missed step, fell down about 5 stairs, landed on R hip.  Severe R hip pain after fall, unable to ambulate or bear wt.  Symptoms constant, worse with movement.  In the ED, pt was found to have a R femoral neck fracture. Pt was admitted and orthopedic surgery consulted  Assessment & Plan:   Principal Problem:   Closed displaced fracture of right femoral neck (HCC) Active Problems:   Alcohol abuse, in remission   Alcoholic dementia (HCC)   HTN (hypertension)   R femoral neck fx Orthopedic surgery was consulted, now planned for surgery today Continue with analgesia as needed Anticipate PT/OT evaluation postoperatively. Pt did complain of residual R hip pain this AM Prior EtOH abuse - Sober for several years now Does have mild EtOH dementia after 50 years of heavy drinking No evidence of withdrawals at this time Stable currenlty HTN - Cont metoprolol Blood pressure trends reviewed, blood pressure appears stable Dehydration Pt was recently noted to be markedly dehydrated with dry mucous membranes, poor skin turgor with decreased urine output Mucus membranes appear improved after receiving IVF bolus and while on basal IVF Postoperatively, would encourage patient self hydrate   DVT prophylaxis: SCD's Code Status: Full Family Communication: Pt in room, family not at bedside  Status is: Inpatient  Remains inpatient appropriate because: severity of illness    Consultants:  Orthopedic Surgery  Procedures:    Antimicrobials: Anti-infectives (From admission, onward)    Start     Dose/Rate Route Frequency Ordered Stop   06/21/21 1530  ceFAZolin (ANCEF) IVPB 2g/100  mL premix        2 g 200 mL/hr over 30 Minutes Intravenous On call to O.R. 06/21/21 1153 06/22/21 0559       Subjective: Reporting continued R hip pain this AM  Objective: Vitals:   06/21/21 0541 06/21/21 1134 06/21/21 1358 06/21/21 1458  BP: 118/89 136/79 115/74   Pulse: 74 79 74   Resp: 18 (!) 22 (!) 24   Temp: 97.9 F (36.6 C) 99 F (37.2 C) 98.2 F (36.8 C)   TempSrc:  Oral Oral   SpO2: 93% 92% 93%   Weight:    45.4 kg  Height:    5\' 1"  (1.549 m)    Intake/Output Summary (Last 24 hours) at 06/21/2021 1723 Last data filed at 06/21/2021 1506 Gross per 24 hour  Intake 2027.5 ml  Output 500 ml  Net 1527.5 ml    Filed Weights   06/20/21 0006 06/21/21 1458  Weight: 45.4 kg 45.4 kg    Examination: General exam: Conversant, in no acute distress Respiratory system: normal chest rise, clear, no audible wheezing Cardiovascular system: regular rhythm, s1-s2 Gastrointestinal system: Nondistended, nontender, pos BS Central nervous system: No seizures, no tremors Extremities: No cyanosis, no joint deformities Skin: No rashes, no pallor Psychiatry: Affect normal // no auditory hallucinations   Data Reviewed: I have personally reviewed following labs and imaging studies  CBC: Recent Labs  Lab 06/19/21 2205 06/21/21 0401  WBC 16.8* 13.8*  NEUTROABS 14.0*  --   HGB 12.8* 11.5*  HCT 38.9* 35.0*  MCV 93.1 93.8  PLT 266 199    Basic Metabolic Panel:  Recent Labs  Lab 06/19/21 2205 06/21/21 0401  NA 138 138  K 3.8 4.7  CL 102 107  CO2 25 27  GLUCOSE 133* 109*  BUN 18 18  CREATININE 1.00 0.63  CALCIUM 8.8* 7.7*    GFR: Estimated Creatinine Clearance: 52 mL/min (by C-G formula based on SCr of 0.63 mg/dL). Liver Function Tests: Recent Labs  Lab 06/21/21 0401  AST 20  ALT 14  ALKPHOS 53  BILITOT 0.4  PROT 5.8*  ALBUMIN 3.1*   No results for input(s): LIPASE, AMYLASE in the last 168 hours. No results for input(s): AMMONIA in the last 168  hours. Coagulation Profile: Recent Labs  Lab 06/19/21 2205  INR 0.9    Cardiac Enzymes: No results for input(s): CKTOTAL, CKMB, CKMBINDEX, TROPONINI in the last 168 hours. BNP (last 3 results) No results for input(s): PROBNP in the last 8760 hours. HbA1C: No results for input(s): HGBA1C in the last 72 hours. CBG: No results for input(s): GLUCAP in the last 168 hours. Lipid Profile: No results for input(s): CHOL, HDL, LDLCALC, TRIG, CHOLHDL, LDLDIRECT in the last 72 hours. Thyroid Function Tests: Recent Labs    06/19/21 2205  TSH 3.312    Anemia Panel: No results for input(s): VITAMINB12, FOLATE, FERRITIN, TIBC, IRON, RETICCTPCT in the last 72 hours. Sepsis Labs: No results for input(s): PROCALCITON, LATICACIDVEN in the last 168 hours.  Recent Results (from the past 240 hour(s))  Resp Panel by RT-PCR (Flu A&B, Covid) Nasopharyngeal Swab     Status: None   Collection Time: 06/19/21 10:20 PM   Specimen: Nasopharyngeal Swab; Nasopharyngeal(NP) swabs in vial transport medium  Result Value Ref Range Status   SARS Coronavirus 2 by RT PCR NEGATIVE NEGATIVE Final    Comment: (NOTE) SARS-CoV-2 target nucleic acids are NOT DETECTED.  The SARS-CoV-2 RNA is generally detectable in upper respiratory specimens during the acute phase of infection. The lowest concentration of SARS-CoV-2 viral copies this assay can detect is 138 copies/mL. A negative result does not preclude SARS-Cov-2 infection and should not be used as the sole basis for treatment or other patient management decisions. A negative result may occur with  improper specimen collection/handling, submission of specimen other than nasopharyngeal swab, presence of viral mutation(s) within the areas targeted by this assay, and inadequate number of viral copies(<138 copies/mL). A negative result must be combined with clinical observations, patient history, and epidemiological information. The expected result is  Negative.  Fact Sheet for Patients:  BloggerCourse.com  Fact Sheet for Healthcare Providers:  SeriousBroker.it  This test is no t yet approved or cleared by the [place] FDA and  has been authorized for detection and/or diagnosis of SARS-CoV-2 by FDA under an Emergency Use Authorization (EUA). This EUA will remain  in effect (meaning this test can be used) for the duration of the COVID-19 declaration under Section 564(b)(1) of the Act, 21 U.S.C.section 360bbb-3(b)(1), unless the authorization is terminated  or revoked sooner.       Influenza A by PCR NEGATIVE NEGATIVE Final   Influenza B by PCR NEGATIVE NEGATIVE Final    Comment: (NOTE) The Xpert Xpress SARS-CoV-2/FLU/RSV plus assay is intended as an aid in the diagnosis of influenza from Nasopharyngeal swab specimens and should not be used as a sole basis for treatment. Nasal washings and aspirates are unacceptable for Xpert Xpress SARS-CoV-2/FLU/RSV testing.  Fact Sheet for Patients: BloggerCourse.com  Fact Sheet for Healthcare Providers: SeriousBroker.it  This test is not yet approved or cleared by the [place]  FDA and has been authorized for detection and/or diagnosis of SARS-CoV-2 by FDA under an Emergency Use Authorization (EUA). This EUA will remain in effect (meaning this test can be used) for the duration of the COVID-19 declaration under Section 564(b)(1) of the Act, 21 U.S.C. section 360bbb-3(b)(1), unless the authorization is terminated or revoked.  Performed at [facility], [address], [place], [place] [address]   Surgical pcr screen     Status: None   Collection Time: 06/21/21  1:05 PM   Specimen: Nasal Mucosa; Nasal Swab  Result Value Ref Range Status   MRSA, PCR NEGATIVE NEGATIVE Final   Staphylococcus aureus NEGATIVE NEGATIVE Final    Comment: (NOTE) The Xpert SA Assay (FDA approved  for NASAL specimens in patients 42 years of age and older), is one component of a comprehensive surveillance program. It is not intended to diagnose infection nor to guide or monitor treatment. Performed at [facility], [address], [place], [place] [address]       Radiology Studies: [facility] Chest Port 1 View  Result Date: 06/19/2021 CLINICAL DATA:  Fall EXAM: PORTABLE CHEST 1 VIEW COMPARISON:  09/14/2019 FINDINGS: Lungs are clear.  No pleural effusion or pneumothorax. The heart is normal in size. Moderate hiatal hernia. Old left rib fracture deformities. IMPRESSION: No evidence of acute cardiopulmonary disease. Electronically Signed   By: Charline Bills M.D.   On: 06/19/2021 22:06   DG Knee Right Port  Result Date: 06/20/2021 CLINICAL DATA:  75 year old male with right lower extremity pain, right femoral neck fracture. EXAM: PORTABLE RIGHT KNEE - 1-2 VIEW COMPARISON:  Right hip series 06/19/2021.  Right tib fib 02/15/2014. FINDINGS: Chronic calcified peripheral vascular disease. No definite joint effusion on the cross-table lateral view. Knee joint alignment and joint spaces appear stable since 2015. No acute osseous abnormality identified. IMPRESSION: No acute osseous abnormality identified about the right knee. Calcified peripheral vascular disease. Electronically Signed   By: Odessa Fleming M.D.   On: 06/20/2021 09:26   DG Hip Unilat W or Wo Pelvis 2-3 Views Right  Result Date: 06/19/2021 CLINICAL DATA:  Fall, pain. EXAM: DG HIP (WITH OR WITHOUT PELVIS) 2-3V RIGHT COMPARISON:  None. FINDINGS: The bones are osteopenic. There is a subcapital right femoral neck fracture, mildly impacted. There is no dislocation. Joint spaces are maintained. Prominent vascular calcifications are seen in the soft tissues. IMPRESSION: 1. Subcapital right femoral neck fracture. Electronically Signed   By: Darliss Cheney M.D.   On: 06/19/2021 18:50    Scheduled Meds:  [MAR Hold] (feeding  supplement) PROSource Plus  30 mL Oral Daily   [MAR Hold] escitalopram  20 mg Oral Daily   [MAR Hold] feeding supplement  1 Container Oral Q24H   [MAR Hold] feeding supplement  237 mL Oral BID BM   [MAR Hold] levothyroxine  50 mcg Oral Daily   [MAR Hold] loratadine  10 mg Oral Daily   [MAR Hold] metoprolol tartrate  25 mg Oral BID   [MAR Hold] multivitamin with minerals  1 tablet Oral Daily   [MAR Hold] pantoprazole  40 mg Oral Daily   povidone-iodine  2 application Topical Once   [MAR Hold] pravastatin  10 mg Oral QHS   Continuous Infusions:  sodium chloride 75 mL/hr at 06/20/21 1905    ceFAZolin (ANCEF) IV     lactated ringers 10 mL/hr at 06/21/21 1650   tranexamic acid       LOS: 2 days   Rickey Barbara,  MD [facility] Pager On Amion  If 7PM-7AM, please contact night-coverage 06/21/2021, 5:23 PM

## 2021-06-21 NOTE — Transfer of Care (Signed)
Immediate Anesthesia Transfer of Care Note  Patient: Ralph Jackson  Procedure(s) Performed: TOTAL HIP ARTHROPLASTY ANTERIOR APPROACH (Right: Hip)  Patient Location: PACU  Anesthesia Type:General  Level of Consciousness: awake and patient cooperative  Airway & Oxygen Therapy: Patient Spontanous Breathing and Patient connected to face mask oxygen  Post-op Assessment: Report given to RN, Post -op Vital signs reviewed and stable and Patient moving all extremities X 4  Post vital signs: stable  Last Vitals:  Vitals Value Taken Time  BP 129/78 06/21/21 1900  Temp    Pulse 97 06/21/21 1905  Resp 22 06/21/21 1905  SpO2 93 % 06/21/21 1905  Vitals shown include unvalidated device data.  Last Pain:  Vitals:   06/21/21 1851  TempSrc:   PainSc: 0-No pain      Patients Stated Pain Goal: 6 (06/21/21 0558)  Complications: No notable events documented.

## 2021-06-22 ENCOUNTER — Inpatient Hospital Stay (HOSPITAL_COMMUNITY): Payer: Medicare Other

## 2021-06-22 DIAGNOSIS — I5021 Acute systolic (congestive) heart failure: Secondary | ICD-10-CM

## 2021-06-22 DIAGNOSIS — S72001A Fracture of unspecified part of neck of right femur, initial encounter for closed fracture: Secondary | ICD-10-CM | POA: Diagnosis not present

## 2021-06-22 DIAGNOSIS — S72041A Displaced fracture of base of neck of right femur, initial encounter for closed fracture: Secondary | ICD-10-CM | POA: Diagnosis not present

## 2021-06-22 DIAGNOSIS — J9601 Acute respiratory failure with hypoxia: Secondary | ICD-10-CM

## 2021-06-22 LAB — ECHOCARDIOGRAM COMPLETE
Area-P 1/2: 2.86 cm2
Height: 61 in
S' Lateral: 2 cm
Weight: 1809.54 oz

## 2021-06-22 LAB — BASIC METABOLIC PANEL
Anion gap: 9 (ref 5–15)
BUN: 19 mg/dL (ref 8–23)
CO2: 24 mmol/L (ref 22–32)
Calcium: 7.5 mg/dL — ABNORMAL LOW (ref 8.9–10.3)
Chloride: 103 mmol/L (ref 98–111)
Creatinine, Ser: 0.93 mg/dL (ref 0.61–1.24)
GFR, Estimated: >60 mL/min (ref >60)
Glucose, Bld: 145 mg/dL — ABNORMAL HIGH (ref 70–99)
Potassium: 3.8 mmol/L (ref 3.5–5.1)
Sodium: 136 mmol/L (ref 135–145)

## 2021-06-22 LAB — CBC
HCT: 32.5 % — ABNORMAL LOW (ref 39.0–52.0)
Hemoglobin: 10.7 g/dL — ABNORMAL LOW (ref 13.0–17.0)
MCH: 31 pg (ref 26.0–34.0)
MCHC: 32.9 g/dL (ref 30.0–36.0)
MCV: 94.2 fL (ref 80.0–100.0)
Platelets: 174 10*3/uL (ref 150–400)
RBC: 3.45 MIL/uL — ABNORMAL LOW (ref 4.22–5.81)
RDW: 14 % (ref 11.5–15.5)
WBC: 14.9 10*3/uL — ABNORMAL HIGH (ref 4.0–10.5)
nRBC: 0 % (ref 0.0–0.2)

## 2021-06-22 MED ORDER — FUROSEMIDE 10 MG/ML IJ SOLN
40.0000 mg | Freq: Once | INTRAMUSCULAR | Status: AC
  Administered 2021-06-22: 40 mg via INTRAVENOUS
  Filled 2021-06-22: qty 4

## 2021-06-22 MED ORDER — HYDROCODONE-ACETAMINOPHEN 5-325 MG PO TABS: 1.0000 | ORAL_TABLET | ORAL | 0 refills | Status: DC | PRN

## 2021-06-22 MED ORDER — ASPIRIN 81 MG PO CHEW: 81.0000 mg | CHEWABLE_TABLET | Freq: Two times a day (BID) | ORAL | 11 refills | Status: DC

## 2021-06-22 NOTE — TOC Initial Note (Addendum)
Transition of Care [facility]) - Initial/Assessment Note    Patient Details  Name: OHM DENTLER MRN: [id number] Date of Birth: July 26, 1946  Transition of Care [facility] CM/SW Contact:    Ida Rogue, LCSW Phone Number: 06/22/2021, 3:23 PM  Clinical Narrative:   Spoke with daughter in follow up to PT recommendation of HH PT.  Mr Gottwald lives with she and her husband, someone is always home to supervise.  She is moving his bedroom to first floor temporarily so that he does not have to negotiate steps. He has all needed DME. She is open to [facility] PT, no preference of agency.  I contacted Cindie with [facility].  Awaiting response. TOC will continue to follow during the course of hospitalization.  Addendum: Cindie said no. No availability in [facility]. Called [facility] services.                  Expected Discharge Plan: Home w Home Health Services Barriers to Discharge: Barriers Resolved   Patient Goals and CMS Choice Patient states their goals for this hospitalization and ongoing recovery are:: to getting over this and go home CMS Medicare.gov Compare Post Acute Care list provided to:: Patient Choice offered to / list presented to : Patient  Expected Discharge Plan and Services Expected Discharge Plan: Home w Home Health Services   Discharge Planning Services: CM Consult Post Acute Care Choice: Home Health Living arrangements for the past 2 months: Single Family Home                                      Prior Living Arrangements/Services Living arrangements for the past 2 months: Single Family Home Lives with:: Adult Children Patient language and need for interpreter reviewed:: Yes Do you feel safe going back to the place where you live?: Yes      Need for Family Participation in Patient Care: Yes (Comment) Care giver support system in place?: Yes (comment) Current home services: DME Criminal Activity/Legal Involvement Pertinent to Current  Situation/Hospitalization: No - Comment as needed  Activities of Daily Living Home Assistive Devices/Equipment: Cane (specify quad or straight) ADL Screening (condition at time of admission) Patient's cognitive ability adequate to safely complete daily activities?: Yes Is the patient deaf or have difficulty hearing?: Yes Does the patient have difficulty seeing, even when wearing glasses/contacts?: No Does the patient have difficulty concentrating, remembering, or making decisions?: No Patient able to express need for assistance with ADLs?: Yes Does the patient have difficulty dressing or bathing?: No Independently performs ADLs?: No Communication: Independent Dressing (OT): Appropriate for developmental age, Needs assistance Is this a change from baseline?: Change from baseline, expected to last >3 days Grooming: Appropriate for developmental age, Needs assistance Is this a change from baseline?: Change from baseline, expected to last >3 days Feeding: Independent Bathing: Needs assistance Is this a change from baseline?: Change from baseline, expected to last >3 days Toileting: Independent with device (comment) (urinal) In/Out Bed: Needs assistance Is this a change from baseline?: Change from baseline, expected to last >3 days Walks in Home: Needs assistance Is this a change from baseline?: Change from baseline, expected to last >3 days Does the patient have difficulty walking or climbing stairs?: Yes Weakness of Legs: Right Weakness of Arms/Hands: None  Permission Sought/Granted Permission sought to share information with : Family Supports Permission granted to share information with : No  Share Information  with NAME: Cyndi Lennert (Daughter)   [phone]           Emotional Assessment Appearance:: Appears stated age     Orientation: : Oriented to Self, Oriented to Place, Oriented to  Time, Oriented to Situation Alcohol / Substance Use: Not Applicable Psych  Involvement: No (comment)  Admission diagnosis:  Closed fracture of neck of right femur, initial encounter (HCC) [S72.001A] Closed displaced fracture of right femoral neck (HCC) [S72.001A] Patient Active Problem List   Diagnosis Date Noted   Closed displaced fracture of right femoral neck (HCC) 06/19/2021   Alcohol abuse, in remission 06/19/2021   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 [place] [address] [place] [address] Phone: [phone] Fax: [phone]  [id number] - [address] [place] - [address] AT [facility] [address] [place] [address] Phone: [phone] Fax: [phone]     Social Determinants of Health (SDOH) Interventions    Readmission Risk Interventions No flowsheet data found.

## 2021-06-22 NOTE — Progress Notes (Addendum)
NAME:  Ralph Jackson, MRN:  [id number], DOB:  September 07, 1945, LOS: 3 ADMISSION DATE:  06/19/2021, CONSULTATION DATE:  06/21/21 REFERRING MD:  Linna Caprice, CHIEF COMPLAINT:  SOB   History of Present Illness:  75 year old man without much PMH who presented after mechanical fall found to have R hip fracture.  This evening underwent R THA.  In PACU extubated but had quickly escalating O2 needs for which anesthesia recommended PCCM eval.  At my time of evaluation patient in no distress saturating high 90s on NRB after using spirometer.  History limited by patient's hearing loss: ROS I was able to get-  no chest pain, cough, chills, fever, sputum production, LE swelling, N/V/D.  Pertinent  Medical History  HTN  Significant Hospital Events: Including procedures, antibiotic start and stop dates in addition to other pertinent events   10/20 evening: R THA, hypoxemia in PACU, PCCM consulted 10/21 on 6 L , sats 92%  Interim History / Subjective:  States his breathing is better Does require 6 L [place] to maintain sats  Objective   Blood pressure (!) 102/57, pulse (!) 101, temperature 98.3 F (36.8 C), temperature source Oral, resp. rate 20, height 5\' 1"  (1.549 m), weight 51.3 kg, SpO2 91 %.        Intake/Output Summary (Last 24 hours) at 06/22/2021 1231 Last data filed at 06/22/2021 0900 Gross per 24 hour  Intake 2945.98 ml  Output 1890 ml  Net 1055.98 ml   Filed Weights   06/20/21 0006 06/21/21 1458 06/21/21 2110  Weight: 45.4 kg 45.4 kg 51.3 kg    Examination: General: frail thin elderly man sitting in chair in NAD HENT: MMM, trachea midline, +temporal wasting Lungs: diminished, occasional crackles, RR  18, no accessory muscle use Cardiovascular: Regular, No RMG, ext warm, brisk cap refill Abdomen: Soft, +BS, ND, NT, Body mass index is 21.37 kg/m. Extremities: No edema Neuro: Moves all 4 ext, very , very hard of hearing Skin: pale, surgical site wrapped    Resolved Hospital  Problem list   N/a  Assessment & Plan:  Postop hypoxemia- no s/s of VTE or fat embolus.   Given how rapidly improved, suspicion here is probably de-recruitment and possibly mild fluid overload.  - Fine for stepdown under TRH - Encourage IS and aggressive pulmonary toilet - OOB to chair - Wean O2 to sats > 90% - minimize pain medications - Mobility per ortho - If persistently hypoxemic in AM, consider dose of lasix and LE duplex - Please decrease maintenance fluids or DC as patient is taking po's patient is + 3100 cc's last 24 hours.  - Consider additional dose Lasix - CXR 10/22 am to eval for edema  PCCM will sign off, please re-consult if we can be of any further assistance  Best Practice (right click and "Reselect all SmartList Selections" daily)  Per primary  Labs   CBC: Recent Labs  Lab 06/19/21 2205 06/21/21 0401 06/22/21 0305  WBC 16.8* 13.8* 14.9*  NEUTROABS 14.0*  --   --   HGB 12.8* 11.5* 10.7*  HCT 38.9* 35.0* 32.5*  MCV 93.1 93.8 94.2  PLT 266 199 174    Basic Metabolic Panel: Recent Labs  Lab 06/19/21 2205 06/21/21 0401 06/22/21 0305  NA 138 138 136  K 3.8 4.7 3.8  CL 102 107 103  CO2 25 27 24   GLUCOSE 133* 109* 145*  BUN 18 18 19   CREATININE 1.00 0.63 0.93  CALCIUM 8.8* 7.7* 7.5*   GFR: Estimated  Creatinine Clearance: 50.6 mL/min (by C-G formula based on SCr of 0.93 mg/dL). Recent Labs  Lab 06/19/21 2205 06/21/21 0401 06/22/21 0305  WBC 16.8* 13.8* 14.9*    Liver Function Tests: Recent Labs  Lab 06/21/21 0401  AST 20  ALT 14  ALKPHOS 53  BILITOT 0.4  PROT 5.8*  ALBUMIN 3.1*   No results for input(s): LIPASE, AMYLASE in the last 168 hours. No results for input(s): AMMONIA in the last 168 hours.  ABG    Component Value Date/Time   HCO3 30.4 (H) 07/27/2007 1637   TCO2 31 05/01/2010 0137     Coagulation Profile: Recent Labs  Lab 06/19/21 2205  INR 0.9    Cardiac Enzymes: No results for input(s): CKTOTAL, CKMB,  CKMBINDEX, TROPONINI in the last 168 hours.  HbA1C: No results found for: HGBA1C  CBG: Recent Labs  Lab 06/21/21 2147  GLUCAP 112*    Review of Systems:   As per HPI  Past Medical History:  He,  has a past medical history of Depression, ETOH abuse, Hypertension, and Urinary tract infection.   Surgical History:  History reviewed. No pertinent surgical history.   Social History:   reports that he has never smoked. He uses smokeless tobacco. He reports that he does not currently use alcohol after a past usage of about 6.0 standard drinks per week. He reports that he does not use drugs.   Family History:  His family history is negative for Osteoporosis.   Allergies No Known Allergies   Home Medications  Prior to Admission medications   Medication Sig Start Date End Date Taking? Authorizing Provider  escitalopram (LEXAPRO) 20 MG tablet Take 20 mg by mouth daily. 05/19/21  Yes [provider]  levothyroxine (SYNTHROID) 50 MCG tablet Take 50 mcg by mouth daily. 05/21/21  Yes [provider]  loratadine (CLARITIN) 10 MG tablet Take 1 tablet (10 mg total) by mouth daily. 12/12/20  Yes Wieters, Hallie C, PA-C  metoprolol tartrate (LOPRESSOR) 25 MG tablet Take 25 mg by mouth 2 (two) times daily.  03/01/15  Yes [provider]  Multiple Vitamins-Minerals (ONE-A-DAY MENS 50+) TABS Take 1 tablet by mouth daily.   Yes [provider]  omeprazole (PRILOSEC) 20 MG capsule Take 20 mg by mouth daily. 05/21/21  Yes [provider]  pravastatin (PRAVACHOL) 10 MG tablet Take 10 mg by mouth at bedtime. 11/26/15  Yes [provider]  PROLIA 60 MG/ML SOSY injection Inject 60 mg into the skin See admin instructions. Every 6 months 06/12/21  Yes [provider]    Bevelyn Ngo, MSN, AGACNP-BC [facility] Pulmonary/Critical Care Medicine See Amion for personal pager PCCM on call pager [phone]  06/22/2021 12:49 PM

## 2021-06-22 NOTE — Evaluation (Signed)
Physical Therapy Evaluation Patient Details Name: Ralph Jackson MRN: [id number] DOB: 03-14-1946 Today's Date: 06/22/2021  History of Present Illness  Pt is a 75 year old male s/p Right THA direct anterior approach due to fracture from fall prior to admission and had Postop hypoxemia.  PMHx significant for ETOH abuse (several years in remission), HTN  Clinical Impression  Patient is s/p above surgery resulting in functional limitations due to the deficits listed below (see PT Problem List). Patient will benefit from skilled PT to increase their independence and safety with mobility to allow discharge to the venue listed below.  Pt reports he lives with his daughter and she works from home.  Pt states his daughter wants him to return home upon d/c.  Pt assisted with ambulating in hallway and requiring cues for safety and technique.  Pt also educated on current oxygen needs.  Pt would benefit from HHPT and supervision with mobility upon d/c for safety.      Recommendations for follow up therapy are one component of a multi-disciplinary discharge planning process, led by the attending physician.  Recommendations may be updated based on patient status, additional functional criteria and insurance authorization.  Follow Up Recommendations Home health PT;Supervision for mobility/OOB    Equipment Recommendations  None recommended by PT    Recommendations for Other Services       Precautions / Restrictions Precautions Precautions: Fall Precaution Comments: monitor HR and sats Restrictions RLE Weight Bearing: Weight bearing as tolerated      Mobility  Bed Mobility               General bed mobility comments: pt in recliner    Transfers Overall transfer level: Needs assistance Equipment used: Rolling walker (2 wheeled) Transfers: Sit to/from Stand Sit to Stand: Min assist         General transfer comment: verbal cues for safe technique, assist to rise and  steady  Ambulation/Gait Ambulation/Gait assistance: Min assist Gait Distance (Feet): 120 Feet Assistive device: Rolling walker (2 wheeled) Gait Pattern/deviations: Step-to pattern;Decreased stance time - right;Antalgic;Trunk flexed     General Gait Details: max cues for step length and RW positioning (pt tends to keep too far forward), SpO2 90% on 4L and RN increased to 6L and SPO2 mostly 91-93%; HR up to 130 bpm  Stairs            Wheelchair Mobility    Modified Rankin (Stroke Patients Only)       Balance Overall balance assessment: History of Falls                                           Pertinent Vitals/Pain Pain Assessment: No/denies pain    Home Living Family/patient expects to be discharged to:: Private residence Living Arrangements: Children Available Help at Discharge: Family;Available PRN/intermittently Type of Home: House Home Access: Stairs to enter Entrance Stairs-Rails: Lawyer of Steps: 4 Home Layout: Two level Home Equipment: Walker - 2 wheels;Cane - single point      Prior Function Level of Independence: Independent               Hand Dominance        Extremity/Trunk Assessment        Lower Extremity Assessment Lower Extremity Assessment: RLE deficits/detail RLE Deficits / Details: observed anticipated post op hip weakness  Communication   Communication: HOH  Cognition Arousal/Alertness: Awake/alert Behavior During Therapy: WFL for tasks assessed/performed Overall Cognitive Status: Within Functional Limits for tasks assessed                                        General Comments      Exercises     Assessment/Plan    PT Assessment Patient needs continued PT services  PT Problem List Decreased strength;Decreased mobility;Decreased range of motion;Decreased knowledge of use of DME;Pain;Decreased activity tolerance;Decreased balance;Cardiopulmonary  status limiting activity       PT Treatment Interventions DME instruction;Gait training;Balance training;Therapeutic exercise;Functional mobility training;Therapeutic activities;Patient/family education;Stair training    PT Goals (Current goals can be found in the Care Plan section)  Acute Rehab PT Goals PT Goal Formulation: With patient Time For Goal Achievement: 07/06/21 Potential to Achieve Goals: Good    Frequency Min 4X/week   Barriers to discharge        Co-evaluation               AM-PAC PT "6 Clicks" Mobility  Outcome Measure Help needed turning from your back to your side while in a flat bed without using bedrails?: A Little Help needed moving from lying on your back to sitting on the side of a flat bed without using bedrails?: A Little Help needed moving to and from a bed to a chair (including a wheelchair)?: A Little Help needed standing up from a chair using your arms (e.g., wheelchair or bedside chair)?: A Little Help needed to walk in hospital room?: A Little Help needed climbing 3-5 steps with a railing? : A Lot 6 Click Score: 17    End of Session Equipment Utilized During Treatment: Gait belt;Oxygen Activity Tolerance: Patient tolerated treatment well Patient left: in chair;with call bell/phone within reach;with chair alarm set Nurse Communication: Mobility status PT Visit Diagnosis: Difficulty in walking, not elsewhere classified (R26.2)    Time: 1137-1200 PT Time Calculation (min) (ACUTE ONLY): 23 min   Charges:   PT Evaluation $PT Eval Low Complexity: 1 Low PT Treatments $Gait Training: 8-22 mins       Ralph Jackson PT, DPT Acute Rehabilitation Services Pager: [phone] Office: [phone]  Ralph Jackson 06/22/2021, 12:58 PM

## 2021-06-22 NOTE — Progress Notes (Signed)
Nutrition Follow-up  DOCUMENTATION CODES:   Severe malnutrition in context of chronic illness, Underweight  INTERVENTION:  - liberalize diet from Heart Healthy to Regular (cleared by MD). - continue Boost Breeze once/day, 30 ml Prosource Plus once/day, and Ensure Enlive/Plus BID.   NUTRITION DIAGNOSIS:   Severe Malnutrition related to chronic illness as evidenced by severe fat depletion, severe muscle depletion. -ongoing  GOAL:   Patient will meet greater than or equal to 90% of their needs -met on average  MONITOR:   PO intake, Supplement acceptance, Labs, Weight trends  REASON FOR ASSESSMENT:   Consult Wound healing  ASSESSMENT:   75 y.o. male with medical history os HTN, depression, and alcohol abuse which has been in remission for several years. He lives with his daughter. He presented to the ED after falling down the steps while attempting to take the trash out. He had severe R hip pain after the fall and was unable to ambulate or bear weight.  POD #1 R total hip arthroplasty.  Patient sitting in chair at the time of visit. Flow sheet documentation indicates 75% completion of breakfast this AM which he shares was pancakes and scrambled eggs.   Patient currently on a Heart Healthy diet. MD ok with change to Regular diet.   He requests assistance in having lunch ordered: he requested pot roast, mashed potatoes with gravy, a chocolate chip cookie, and lemonade.   Per review of orders, he has been accepting all oral nutrition supplements each time they are offered to him. Patient reports he specifically enjoys chocolate Ensure.   Weight on 10/19 documented as 100 lb and yesterday as both 100 lb and 113 lb. In edema section of flow sheet it is documented that he has no edema present today. He is noted to be +3.2 L since admission.    Labs reviewed; Ca: 8.5 mg/dl. Medications reviewed; 100 mg colace BID, 50 mcg oral synthoid/day, 1 tablet multivitamin with minerals/day,  40 mg oral protonix/day, 1 tablet senokot BID.   IVF; NS @ 75 ml/hr.    Diet Order:   Diet Order             Diet regular Room service appropriate? Yes; Fluid consistency: Thin  Diet effective now                   EDUCATION NEEDS:   Not appropriate for education at this time  Skin:  Skin Assessment: Skin Integrity Issues: Skin Integrity Issues:: Incisions Incisions: R hip (10/20)  Last BM:  PTA/unknown  Height:   Ht Readings from Last 1 Encounters:  06/21/21 '5\' 1"'  (1.549 m)    Weight:   Wt Readings from Last 1 Encounters:  06/21/21 51.3 kg     Estimated Nutritional Needs:  Kcal:  1600-1850 kcal Protein:  90-100 grams Fluid:  >/= 1.7 L/day     Jarome Matin, MS, RD, LDN, CNSC Inpatient Clinical Dietitian RD pager # available in AMION  After hours/weekend pager # available in [facility]

## 2021-06-22 NOTE — Progress Notes (Signed)
  Echocardiogram 2D Echocardiogram has been performed.  Ralph Jackson 06/22/2021, 4:00 PM

## 2021-06-22 NOTE — Evaluation (Signed)
Occupational Therapy Evaluation Patient Details Name: Ralph Jackson MRN: [id number] DOB: 1946/03/08 Today's Date: 06/22/2021   History of Present Illness Pt is a 75 year old male s/p Right THA direct anterior approach due to fracture from fall prior to admission and had Postop hypoxemia.  PMHx significant for ETOH abuse (several years in remission), HTN   Clinical Impression   PTA, pt was living with daughter who cooked and cleaned for him and pt was independent in ADLs without the use of an assistive device. Pt did report that he furniture walked and held onto walls with functional mobility in his home. Upon evaluation, pt with decreased cardiopulmonary endurance requiring the use of 4L of continuous O2, decreased balance, and decreased ROM of R hip post hip replacement limiting functional abilities. Pt currently requiring set up for UB ADLs, Mod A for LB ADLs, and Min guard for stand-pivot to recliner with RW. Patient will benefit from skilled OT services while in hospital to improve deficits and learn compensatory strategies as needed in order to return to PLOF. Due to pt and pt reporting caregiver preference, current level of function, recommending HHOT to optimize home safety post d/c.      Recommendations for follow up therapy are one component of a multi-disciplinary discharge planning process, led by the attending physician.  Recommendations may be updated based on patient status, additional functional criteria and insurance authorization.   Follow Up Recommendations  Home health OT;Supervision - Intermittent    Equipment Recommendations  3 in 1 bedside commode    Recommendations for Other Services       Precautions / Restrictions Precautions Precautions: Fall Precaution Booklet Issued: No Precaution Comments: monitor HR and sats Restrictions Weight Bearing Restrictions: Yes RLE Weight Bearing: Weight bearing as tolerated      Mobility Bed Mobility Overal bed  mobility: Needs Assistance Bed Mobility: Supine to Sit     Supine to sit: HOB elevated     General bed mobility comments: pt in recliner    Transfers Overall transfer level: Needs assistance Equipment used: Rolling walker (2 wheeled) Transfers: Sit to/from Stand Sit to Stand: Min assist Stand pivot transfers: Min guard       General transfer comment: verbal cues for safe technique, assist to rise and steady    Balance Overall balance assessment: History of Falls Sitting-balance support: No upper extremity supported;Feet unsupported Sitting balance-Leahy Scale: Good Sitting balance - Comments: static good   Standing balance support: Bilateral upper extremity supported Standing balance-Leahy Scale: Poor Standing balance comment: relied on BUE support with stand pivot.                           ADL either performed or assessed with clinical judgement   ADL Overall ADL's : Needs assistance/impaired Eating/Feeding: Modified independent   Grooming: Set up;Sitting   Upper Body Bathing: Sitting;Set up   Lower Body Bathing: Sit to/from stand;moderate assistance   Upper Body Dressing : Sitting;Set up   Lower Body Dressing: Sit to/from stand;Moderate assistance   Toilet Transfer: Min guard;RW   Toileting- Clothing Manipulation and Hygiene: Moderate assistance;Sit to/from stand       Functional mobility during ADLs: Min guard;Rolling walker       Vision Patient Visual Report: No change from baseline       Perception     Praxis      Pertinent Vitals/Pain Pain Assessment: Faces Faces Pain Scale: No hurt Pain Intervention(s): Monitored during session  Hand Dominance Right   Extremity/Trunk Assessment Upper Extremity Assessment Upper Extremity Assessment: Overall WFL for tasks assessed (will continue to assess)   Lower Extremity Assessment Lower Extremity Assessment: RLE deficits/detail RLE Deficits / Details: observed anticipated post op  hip weakness       Communication Communication Communication: HOH   Cognition Arousal/Alertness: Awake/alert Behavior During Therapy: WFL for tasks assessed/performed Overall Cognitive Status: Within Functional Limits for tasks assessed                                     General Comments       Exercises     Shoulder Instructions      Home Living Family/patient expects to be discharged to:: Private residence Living Arrangements: Children Available Help at Discharge: Family;Available PRN/intermittently Type of Home: House Home Access: Stairs to enter [employer] of Steps: 4 Entrance Stairs-Rails: Left;Right Home Layout: Two level Alternate Level Stairs-Number of Steps: reports 8-9 steps to his room   Bathroom Shower/Tub: Tub/shower unit         Home Equipment: Environmental consultant - 2 wheels;Cane - single point;Wheelchair - manual   Additional Comments: Lives with daughter      Prior Functioning/Environment Level of Independence: Independent        Comments: daughter performs cooking and cleaning, pt independent in ADLs without an assistive device.        OT Problem List: Decreased activity tolerance;Impaired balance (sitting and/or standing);Decreased range of motion;Decreased knowledge of use of DME or AE      OT Treatment/Interventions: Self-care/ADL training;Therapeutic exercise;DME and/or AE instruction;Therapeutic activities;Patient/family education;Balance training    OT Goals(Current goals can be found in the care plan section) Acute Rehab OT Goals Patient Stated Goal: To go home OT Goal Formulation: With patient Time For Goal Achievement: 07/05/21 Potential to Achieve Goals: Good  OT Frequency: Min 2X/week   Barriers to D/C:            Co-evaluation              AM-PAC OT "6 Clicks" Daily Activity     Outcome Measure Help from another person eating meals?: None Help from another person taking care of personal grooming?:  A Little Help from another person toileting, which includes using toliet, bedpan, or urinal?: A Lot Help from another person bathing (including washing, rinsing, drying)?: A Lot Help from another person to put on and taking off regular upper body clothing?: A Little Help from another person to put on and taking off regular lower body clothing?: A Lot 6 Click Score: 16   End of Session Equipment Utilized During Treatment: Gait belt;Rolling walker;Oxygen Nurse Communication: Mobility status  Activity Tolerance: Patient tolerated treatment well Patient left: in chair;with call bell/phone within reach;with chair alarm set  OT Visit Diagnosis: Unsteadiness on feet (R26.81);Muscle weakness (generalized) (M62.81);History of falling (Z91.81);Pain                Time: 5643-3295 OT Time Calculation (min): 34 min Charges:  OT General Charges $OT Visit: 1 Visit OT Evaluation $OT Eval Low Complexity: 1 Low OT Treatments $Therapeutic Activity: 8-22 mins  Prudence Davidson, OTS Acute Rehab Office: [phone]   Milanya Sunderland 06/22/2021, 1:02 PM

## 2021-06-22 NOTE — Progress Notes (Signed)
PROGRESS NOTE    Ralph Jackson  PPI:[id number] DOB: 06/02/1946 DOA: 06/19/2021 PCP: Ronnald Collum    Brief Narrative:  75 y.o. male with medical history significant of prior EtOH abuse, in remission for several years now. Ralph Jackson reportedly was taking out trash, carying boxes and couldn't see steps.  Missed step, fell down about 5 stairs, landed on R hip.  Severe R hip pain after fall, unable to ambulate or bear wt.  Symptoms constant, worse with movement.  In the ED, Ralph Jackson was found to have a R femoral neck fracture. Ralph Jackson was admitted and orthopedic surgery consulted  Assessment & Plan:   Principal Problem:   Closed displaced fracture of right femoral neck (HCC) Active Problems:   Alcohol abuse, in remission   Alcoholic dementia (HCC)   HTN (hypertension)   R femoral neck fx Orthopedic surgery was consulted, now s/p surgery 10/20 Continue with analgesia as needed Therapy recs for HHPT thus far Prior EtOH abuse - Sober for several years now Does have mild EtOH dementia after 50 years of heavy drinking Without evidence of withdrawals at this time HTN - Cont metoprolol as tolerated Blood pressure trends reviewed, blood pressure appears stable Recent Dehydration Ralph Jackson was recently noted to be markedly dehydrated with dry mucous membranes, poor skin turgor with decreased urine output, improved with fluids Acute hypoxemic respiratory failure Unclear etiology, however, Ralph Jackson is noted to have evidence of pulm edema on CXR Ralph Jackson was transferred to ICU overnight, improved with trial of lasix Cont to wean O2 as tolerated As Ralph Jackson is able to tolerate PO, will hold further basal fluids Appreciate assistance by PCCM   DVT prophylaxis: SCD's Code Status: Full Family Communication: Ralph Jackson in room, family not at bedside  Status is: Inpatient  Remains inpatient appropriate because: severity of illness    Consultants:  Orthopedic Surgery PCCM  Procedures:  Right total hip arthroplasty,  anterior approach  Antimicrobials: Anti-infectives (From admission, onward)    Start     Dose/Rate Route Frequency Ordered Stop   06/21/21 2300  ceFAZolin (ANCEF) IVPB 2g/100 mL premix        2 g 200 mL/hr over 30 Minutes Intravenous Every 6 hours 06/21/21 2128 06/22/21 0534   06/21/21 1530  ceFAZolin (ANCEF) IVPB 2g/100 mL premix        2 g 200 mL/hr over 30 Minutes Intravenous On call to O.R. 06/21/21 1153 06/21/21 1700       Subjective: Without complaints this AM  Objective: Vitals:   06/22/21 1100 06/22/21 1154 06/22/21 1200 06/22/21 1236  BP: (!) 92/54  (!) 102/57   Pulse: (!) 112  (!) 101   Resp: 19  20   Temp:    98.3 F (36.8 C)  TempSrc:    Oral  SpO2: 92% 91% 91%   Weight:      Height:        Intake/Output Summary (Last 24 hours) at 06/22/2021 1434 Last data filed at 06/22/2021 0900 Gross per 24 hour  Intake 2945.98 ml  Output 1890 ml  Net 1055.98 ml    Filed Weights   06/20/21 0006 06/21/21 1458 06/21/21 2110  Weight: 45.4 kg 45.4 kg 51.3 kg    Examination: General exam: Awake, laying in bed, in nad Respiratory system: Normal respiratory effort, no wheezing Cardiovascular system: regular rate, s1, s2 Gastrointestinal system: Soft, nondistended, positive BS Central nervous system: CN2-12 grossly intact, strength intact Extremities: Perfused, no clubbing Skin: Normal skin turgor, no notable skin lesions seen  Psychiatry: Mood normal // no visual hallucinations   Data Reviewed: I have personally reviewed following labs and imaging studies  CBC: Recent Labs  Lab 06/19/21 2205 06/21/21 0401 06/22/21 0305  WBC 16.8* 13.8* 14.9*  NEUTROABS 14.0*  --   --   HGB 12.8* 11.5* 10.7*  HCT 38.9* 35.0* 32.5*  MCV 93.1 93.8 94.2  PLT 266 199 174    Basic Metabolic Panel: Recent Labs  Lab 06/19/21 2205 06/21/21 0401 06/22/21 0305  NA 138 138 136  K 3.8 4.7 3.8  CL 102 107 103  CO2 25 27 24   GLUCOSE 133* 109* 145*  BUN 18 18 19   CREATININE  1.00 0.63 0.93  CALCIUM 8.8* 7.7* 7.5*    GFR: Estimated Creatinine Clearance: 50.6 mL/min (by C-G formula based on SCr of 0.93 mg/dL). Liver Function Tests: Recent Labs  Lab 06/21/21 0401  AST 20  ALT 14  ALKPHOS 53  BILITOT 0.4  PROT 5.8*  ALBUMIN 3.1*    No results for input(s): LIPASE, AMYLASE in the last 168 hours. No results for input(s): AMMONIA in the last 168 hours. Coagulation Profile: Recent Labs  Lab 06/19/21 2205  INR 0.9    Cardiac Enzymes: No results for input(s): CKTOTAL, CKMB, CKMBINDEX, TROPONINI in the last 168 hours. BNP (last 3 results) No results for input(s): PROBNP in the last 8760 hours. HbA1C: No results for input(s): HGBA1C in the last 72 hours. CBG: Recent Labs  Lab 06/21/21 2147  GLUCAP 112*   Lipid Profile: No results for input(s): CHOL, HDL, LDLCALC, TRIG, CHOLHDL, LDLDIRECT in the last 72 hours. Thyroid Function Tests: Recent Labs    06/19/21 2205  TSH 3.312    Anemia Panel: No results for input(s): VITAMINB12, FOLATE, FERRITIN, TIBC, IRON, RETICCTPCT in the last 72 hours. Sepsis Labs: No results for input(s): PROCALCITON, LATICACIDVEN in the last 168 hours.  Recent Results (from the past 240 hour(s))  Resp Panel by RT-PCR (Flu A&B, Covid) Nasopharyngeal Swab     Status: None   Collection Time: 06/19/21 10:20 PM   Specimen: Nasopharyngeal Swab; Nasopharyngeal(NP) swabs in vial transport medium  Result Value Ref Range Status   SARS Coronavirus 2 by RT PCR NEGATIVE NEGATIVE Final    Comment: (NOTE) SARS-CoV-2 target nucleic acids are NOT DETECTED.  The SARS-CoV-2 RNA is generally detectable in upper respiratory specimens during the acute phase of infection. The lowest concentration of SARS-CoV-2 viral copies this assay can detect is 138 copies/mL. A negative result does not preclude SARS-Cov-2 infection and should not be used as the sole basis for treatment or other patient management decisions. A negative result may  occur with  improper specimen collection/handling, submission of specimen other than nasopharyngeal swab, presence of viral mutation(s) within the areas targeted by this assay, and inadequate number of viral copies(<138 copies/mL). A negative result must be combined with clinical observations, patient history, and epidemiological information. The expected result is Negative.  Fact Sheet for Patients:  2206  Fact Sheet for Healthcare Providers:  06/21/21  This test is no t yet approved or cleared by the [employer] FDA and  has been authorized for detection and/or diagnosis of SARS-CoV-2 by FDA under an Emergency Use Authorization (EUA). This EUA will remain  in effect (meaning this test can be used) for the duration of the COVID-19 declaration under Section 564(b)(1) of the Act, 21 U.S.C.section 360bbb-3(b)(1), unless the authorization is terminated  or revoked sooner.       Influenza A by PCR NEGATIVE  NEGATIVE Final   Influenza B by PCR NEGATIVE NEGATIVE Final    Comment: (NOTE) The Xpert Xpress SARS-CoV-2/FLU/RSV plus assay is intended as an aid in the diagnosis of influenza from Nasopharyngeal swab specimens and should not be used as a sole basis for treatment. Nasal washings and aspirates are unacceptable for Xpert Xpress SARS-CoV-2/FLU/RSV testing.  Fact Sheet for Patients: [employer]  Fact Sheet for Healthcare Providers: [employer]ious[employer]ker[employer]it  This test is not yet approved or cleared by the [place] FDA and has been authorized for detection and/or diagnosis of SARS-CoV-2 by FDA under an Emergency Use Authorization (EUA). This EUA will remain in effect (meaning this test can be used) for the duration of the COVID-19 declaration under Section 564(b)(1) of the Act, 21 U.S.C. section 360bbb-3(b)(1), unless the authorization is terminated  or revoked.  Performed at [facility], [address], [place], [place] [address]   Surgical pcr screen     Status: None   Collection Time: 06/21/21  1:05 PM   Specimen: Nasal Mucosa; Nasal Swab  Result Value Ref Range Status   MRSA, PCR NEGATIVE NEGATIVE Final   Staphylococcus aureus NEGATIVE NEGATIVE Final    Comment: (NOTE) The Xpert SA Assay (FDA approved for NASAL specimens in patients 68 years of age and older), is one component of a comprehensive surveillance program. It is not intended to diagnose infection nor to guide or monitor treatment. Performed at [facility], [address] [place], [place] [address]       Radiology Studies: Pelvis Portable  Result Date: 06/21/2021 CLINICAL DATA:  Postop EXAM: PORTABLE PELVIS 1-2 VIEWS COMPARISON:  06/19/2021 FINDINGS: Interval right hip replacement with intact hardware and normal alignment. Gas in the soft tissues consistent with recent surgery. Vascular calcifications IMPRESSION: Right hip replacement with expected postsurgical change Electronically Signed   By: Jasmine Pang M.D.   On: 06/21/2021 19:37   DG Chest Port 1 View  Result Date: 06/21/2021 CLINICAL DATA:  Status post right hip replacement and respiratory compromise, initial encounter EXAM: PORTABLE CHEST 1 VIEW COMPARISON:  06/19/2021 FINDINGS: Cardiac shadow is stable. Aortic calcifications are noted. Increased vascular congestion is noted with mild interstitial edema. Mild basilar atelectasis is noted as well. No bony abnormality is seen. IMPRESSION: Changes consistent with mild CHF. Electronically Signed   By: Alcide Clever M.D.   On: 06/21/2021 21:14   DG C-Arm 1-60 Min-No Report  Result Date: 06/21/2021 Fluoroscopy was utilized by the requesting physician.  No radiographic interpretation.   DG C-Arm 1-60 Min-No Report  Result Date: 06/21/2021 Fluoroscopy was utilized by the requesting physician.  No radiographic interpretation.    DG HIP UNILAT WITH PELVIS 2-3 VIEWS RIGHT  Result Date: 06/21/2021 CLINICAL DATA:  Right hip arthroplasty EXAM: DG HIP (WITH OR WITHOUT PELVIS) 2-3V RIGHT COMPARISON:  06/19/2021 FINDINGS: Five fluoroscopic images are obtained during the performance of the procedure and are provided for interpretation only. A subcapital right femoral neck fracture is again identified. Subsequent placement of a right hip arthroplasty in the expected position without signs of acute complication. Please refer to the operative report. FLUOROSCOPY TIME:  20 seconds IMPRESSION: 1. Right hip arthroplasty as above. Please refer to the operative report. Electronically Signed   By: Sharlet Salina M.D.   On: 06/21/2021 18:58    Scheduled Meds:  (feeding supplement) PROSource Plus  30 mL Oral Daily   aspirin EC  325 mg Oral Q breakfast   Chlorhexidine Gluconate Cloth  6 each Topical  Daily   docusate sodium  100 mg Oral BID   escitalopram  20 mg Oral Daily   feeding supplement  1 Container Oral Q24H   feeding supplement  237 mL Oral BID BM   levothyroxine  50 mcg Oral Daily   loratadine  10 mg Oral Daily   mouth rinse  15 mL Mouth Rinse BID   metoprolol tartrate  25 mg Oral BID   multivitamin with minerals  1 tablet Oral Daily   pantoprazole  40 mg Oral Daily   pravastatin  10 mg Oral QHS   senna  1 tablet Oral BID   Continuous Infusions:  sodium chloride 75 mL/hr at 06/22/21 0900     LOS: 3 days   Rickey Barbara, MD [facility] Pager On Amion  If 7PM-7AM, please contact night-coverage 06/22/2021, 2:34 PM

## 2021-06-22 NOTE — Progress Notes (Signed)
    Subjective:  Patient reports pain as mild to moderate.  Denies N/V/CP/SOB.   Objective:   VITALS:   Vitals:   06/22/21 1100 06/22/21 1154 06/22/21 1200 06/22/21 1236  BP: (!) 92/54  (!) 102/57   Pulse: (!) 112  (!) 101   Resp: 19  20   Temp:    98.3 F (36.8 C)  TempSrc:    Oral  SpO2: 92% 91% 91%   Weight:      Height:        NAD ABD soft Neurovascular intact Sensation intact distally Intact pulses distally Dorsiflexion/Plantar flexion intact Incision: dressing C/D/I   Lab Results  Component Value Date   WBC 14.9 (H) 06/22/2021   HGB 10.7 (L) 06/22/2021   HCT 32.5 (L) 06/22/2021   MCV 94.2 06/22/2021   PLT 174 06/22/2021   BMET    Component Value Date/Time   NA 136 06/22/2021 0305   NA 134 (L) 01/07/2014 1356   K 3.8 06/22/2021 0305   K 3.8 01/07/2014 1356   CL 103 06/22/2021 0305   CL 101 01/07/2014 1356   CO2 24 06/22/2021 0305   CO2 29 01/07/2014 1356   GLUCOSE 145 (H) 06/22/2021 0305   GLUCOSE 90 01/07/2014 1356   BUN 19 06/22/2021 0305   BUN 25 (H) 01/07/2014 1356   CREATININE 0.93 06/22/2021 0305   CREATININE 0.88 01/07/2014 1356   CALCIUM 7.5 (L) 06/22/2021 0305   CALCIUM 9.2 01/07/2014 1356   GFRNONAA >60 06/22/2021 0305   GFRNONAA >60 01/07/2014 1356     Assessment/Plan: 1 Day Post-Op   Principal Problem:   Closed displaced fracture of right femoral neck (HCC) Active Problems:   Alcohol abuse, in remission   Alcoholic dementia (HCC)   HTN (hypertension)   WBAT with walker DVT ppx: Aspirin, SCDs, TEDS PO pain control PT/OT Dispo: Clear from an orthopedic standpoint once cleared by therapy. Rx sent for ASA 81MG  BID and hydrocodone. Follow up 2 weeks after discharge for incision check and radiographs     06/22/2021, 3:55 PM   [facility] is now [facility] [address], [place], [place] Phone: [phone] [email or web address]   [phone]

## 2021-06-23 DIAGNOSIS — S72001A Fracture of unspecified part of neck of right femur, initial encounter for closed fracture: Secondary | ICD-10-CM | POA: Diagnosis not present

## 2021-06-23 DIAGNOSIS — F1011 Alcohol abuse, in remission: Secondary | ICD-10-CM | POA: Diagnosis not present

## 2021-06-23 LAB — BASIC METABOLIC PANEL
Anion gap: 5 (ref 5–15)
BUN: 22 mg/dL (ref 8–23)
CO2: 29 mmol/L (ref 22–32)
Calcium: 7.8 mg/dL — ABNORMAL LOW (ref 8.9–10.3)
Chloride: 103 mmol/L (ref 98–111)
Creatinine, Ser: 0.66 mg/dL (ref 0.61–1.24)
GFR, Estimated: >60 mL/min (ref >60)
Glucose, Bld: 105 mg/dL — ABNORMAL HIGH (ref 70–99)
Potassium: 3.9 mmol/L (ref 3.5–5.1)
Sodium: 137 mmol/L (ref 135–145)

## 2021-06-23 LAB — CBC
HCT: 26.8 % — ABNORMAL LOW (ref 39.0–52.0)
Hemoglobin: 9 g/dL — ABNORMAL LOW (ref 13.0–17.0)
MCH: 30.8 pg (ref 26.0–34.0)
MCHC: 33.6 g/dL (ref 30.0–36.0)
MCV: 91.8 fL (ref 80.0–100.0)
Platelets: 176 10*3/uL (ref 150–400)
RBC: 2.92 MIL/uL — ABNORMAL LOW (ref 4.22–5.81)
RDW: 13.8 % (ref 11.5–15.5)
WBC: 12.6 10*3/uL — ABNORMAL HIGH (ref 4.0–10.5)
nRBC: 0 % (ref 0.0–0.2)

## 2021-06-23 LAB — LACTIC ACID, PLASMA: Lactic Acid, Venous: 1.1 mmol/L (ref 0.5–1.9)

## 2021-06-23 LAB — MAGNESIUM: Magnesium: 1.7 mg/dL (ref 1.7–2.4)

## 2021-06-23 LAB — TROPONIN I (HIGH SENSITIVITY): Troponin I (High Sensitivity): 38 ng/L — ABNORMAL HIGH (ref <18)

## 2021-06-23 LAB — PHOSPHORUS: Phosphorus: <1 mg/dL — CL (ref 2.5–4.6)

## 2021-06-23 MED ORDER — FUROSEMIDE 10 MG/ML IJ SOLN
20.0000 mg | Freq: Once | INTRAMUSCULAR | Status: AC
  Administered 2021-06-23: 20 mg via INTRAVENOUS
  Filled 2021-06-23: qty 2

## 2021-06-23 MED ORDER — METOPROLOL TARTRATE 25 MG PO TABS
12.5000 mg | ORAL_TABLET | Freq: Two times a day (BID) | ORAL | Status: DC
  Administered 2021-06-23 – 2021-06-28 (×10): 12.5 mg via ORAL
  Filled 2021-06-23 (×10): qty 1

## 2021-06-23 MED ORDER — K PHOS MONO-SOD PHOS DI & MONO 155-852-130 MG PO TABS
500.0000 mg | ORAL_TABLET | Freq: Two times a day (BID) | ORAL | Status: AC
  Administered 2021-06-23 (×2): 500 mg via ORAL
  Filled 2021-06-23 (×2): qty 2

## 2021-06-23 NOTE — Progress Notes (Signed)
PROGRESS NOTE    Ralph Jackson  IOE:[id number] DOB: 11-07-1945 DOA: 06/19/2021 PCP: Ronnald Collum    Brief Narrative:  75 y.o. male with medical history significant of prior EtOH abuse, in remission for several years now. Pt reportedly was taking out trash, carying boxes and couldn't see steps.  Missed step, fell down about 5 stairs, landed on R hip.  Severe R hip pain after fall, unable to ambulate or bear wt.  Symptoms constant, worse with movement.  In the ED, pt was found to have a R femoral neck fracture. Pt was admitted and orthopedic surgery consulted  Assessment & Plan:   Principal Problem:   Closed displaced fracture of right femoral neck (HCC) Active Problems:   Alcohol abuse, in remission   Alcoholic dementia (HCC)   HTN (hypertension)   Acute respiratory failure with hypoxia (HCC)   R femoral neck fx Orthopedic surgery was consulted, now s/p surgery 10/20 Continue with analgesia as needed Therapy recs were noted for HHPT at time of d/c Prior EtOH abuse - Sober for several years now Does have mild EtOH dementia after 50 years of heavy drinking Without evidence of withdrawals at this time HTN  Cont metoprolol as tolerated, dose was decreased to 12.5mg  given soft BP Recent Dehydration Pt was recently noted to be markedly dehydrated with dry mucous membranes, poor skin turgor with decreased urine output, improved with fluids Acute hypoxemic respiratory failure Unclear etiology, however, pt is noted to have evidence of pulm edema on CXR Pt was transferred to ICU overnight, improved with trial of lasix Cont to wean O2 as tolerated Appreciate assistance by PCCM   DVT prophylaxis: SCD's Code Status: Full Family Communication: Pt in room, family not at bedside  Status is: Inpatient  Remains inpatient appropriate because: severity of illness    Consultants:  Orthopedic Surgery PCCM  Procedures:  Right total hip arthroplasty, anterior  approach  Antimicrobials: Anti-infectives (From admission, onward)    Start     Dose/Rate Route Frequency Ordered Stop   06/21/21 2300  ceFAZolin (ANCEF) IVPB 2g/100 mL premix        2 g 200 mL/hr over 30 Minutes Intravenous Every 6 hours 06/21/21 2128 06/22/21 0534   06/21/21 1530  ceFAZolin (ANCEF) IVPB 2g/100 mL premix        2 g 200 mL/hr over 30 Minutes Intravenous On call to O.R. 06/21/21 1153 06/21/21 1700       Subjective: Currently without complaints this AM  Objective: Vitals:   06/23/21 1255 06/23/21 1313 06/23/21 1400 06/23/21 1500  BP:   122/71 (!) 85/64  Pulse: 71  80 78  Resp: 16  17 19   Temp:  98.9 F (37.2 C)    TempSrc:  Oral    SpO2: 94%  95% 94%  Weight:      Height:        Intake/Output Summary (Last 24 hours) at 06/23/2021 1554 Last data filed at 06/23/2021 1409 Gross per 24 hour  Intake 480 ml  Output 795 ml  Net -315 ml    Filed Weights   06/20/21 0006 06/21/21 1458 06/21/21 2110  Weight: 45.4 kg 45.4 kg 51.3 kg    Examination: General exam: Conversant, in no acute distress Respiratory system: normal chest rise, clear, no audible wheezing Cardiovascular system: regular rhythm, s1-s2 Gastrointestinal system: Nondistended, nontender, pos BS Central nervous system: No seizures, no tremors Extremities: No cyanosis, no joint deformities Skin: No rashes, no pallor Psychiatry: Affect normal // no  auditory hallucinations   Data Reviewed: I have personally reviewed following labs and imaging studies  CBC: Recent Labs  Lab 06/19/21 2205 06/21/21 0401 06/22/21 0305 06/23/21 0310  WBC 16.8* 13.8* 14.9* 12.6*  NEUTROABS 14.0*  --   --   --   HGB 12.8* 11.5* 10.7* 9.0*  HCT 38.9* 35.0* 32.5* 26.8*  MCV 93.1 93.8 94.2 91.8  PLT 266 199 174 176    Basic Metabolic Panel: Recent Labs  Lab 06/19/21 2205 06/21/21 0401 06/22/21 0305 06/23/21 0310  NA 138 138 136 137  K 3.8 4.7 3.8 3.9  CL 102 107 103 103  CO2 25 27 24 29   GLUCOSE  133* 109* 145* 105*  BUN 18 18 19 22   CREATININE 1.00 0.63 0.93 0.66  CALCIUM 8.8* 7.7* 7.5* 7.8*  MG  --   --   --  1.7  PHOS  --   --   --  <1.0*    GFR: Estimated Creatinine Clearance: 58.8 mL/min (by C-G formula based on SCr of 0.66 mg/dL). Liver Function Tests: Recent Labs  Lab 06/21/21 0401  AST 20  ALT 14  ALKPHOS 53  BILITOT 0.4  PROT 5.8*  ALBUMIN 3.1*    No results for input(s): LIPASE, AMYLASE in the last 168 hours. No results for input(s): AMMONIA in the last 168 hours. Coagulation Profile: Recent Labs  Lab 06/19/21 2205  INR 0.9    Cardiac Enzymes: No results for input(s): CKTOTAL, CKMB, CKMBINDEX, TROPONINI in the last 168 hours. BNP (last 3 results) No results for input(s): PROBNP in the last 8760 hours. HbA1C: No results for input(s): HGBA1C in the last 72 hours. CBG: Recent Labs  Lab 06/21/21 2147  GLUCAP 112*    Lipid Profile: No results for input(s): CHOL, HDL, LDLCALC, TRIG, CHOLHDL, LDLDIRECT in the last 72 hours. Thyroid Function Tests: No results for input(s): TSH, T4TOTAL, FREET4, T3FREE, THYROIDAB in the last 72 hours.  Anemia Panel: No results for input(s): VITAMINB12, FOLATE, FERRITIN, TIBC, IRON, RETICCTPCT in the last 72 hours. Sepsis Labs: Recent Labs  Lab 06/23/21 0310  LATICACIDVEN 1.1    Recent Results (from the past 240 hour(s))  Resp Panel by RT-PCR (Flu A&B, Covid) Nasopharyngeal Swab     Status: None   Collection Time: 06/19/21 10:20 PM   Specimen: Nasopharyngeal Swab; Nasopharyngeal(NP) swabs in vial transport medium  Result Value Ref Range Status   SARS Coronavirus 2 by RT PCR NEGATIVE NEGATIVE Final    Comment: (NOTE) SARS-CoV-2 target nucleic acids are NOT DETECTED.  The SARS-CoV-2 RNA is generally detectable in upper respiratory specimens during the acute phase of infection. The lowest concentration of SARS-CoV-2 viral copies this assay can detect is 138 copies/mL. A negative result does not preclude  SARS-Cov-2 infection and should not be used as the sole basis for treatment or other patient management decisions. A negative result may occur with  improper specimen collection/handling, submission of specimen other than nasopharyngeal swab, presence of viral mutation(s) within the areas targeted by this assay, and inadequate number of viral copies(<138 copies/mL). A negative result must be combined with clinical observations, patient history, and epidemiological information. The expected result is Negative.  Fact Sheet for Patients:  06/25/21  Fact Sheet for Healthcare Providers:  06/21/21  This test is no t yet approved or cleared by the [employer] FDA and  has been authorized for detection and/or diagnosis of SARS-CoV-2 by FDA under an Emergency Use Authorization (EUA). This EUA will remain  in effect (  meaning this test can be used) for the duration of the COVID-19 declaration under Section 564(b)(1) of the Act, 21 U.S.C.section 360bbb-3(b)(1), unless the authorization is terminated  or revoked sooner.       Influenza A by PCR NEGATIVE NEGATIVE Final   Influenza B by PCR NEGATIVE NEGATIVE Final    Comment: (NOTE) The Xpert Xpress SARS-CoV-2/FLU/RSV plus assay is intended as an aid in the diagnosis of influenza from Nasopharyngeal swab specimens and should not be used as a sole basis for treatment. Nasal washings and aspirates are unacceptable for Xpert Xpress SARS-CoV-2/FLU/RSV testing.  Fact Sheet for Patients: [employer]  Fact Sheet for Healthcare Providers: [employer]  This test is not yet approved or cleared by the [place] FDA and has been authorized for detection and/or diagnosis of SARS-CoV-2 by FDA under an Emergency Use Authorization (EUA). This EUA will remain in effect (meaning this test can be used) for the duration of  the COVID-19 declaration under Section 564(b)(1) of the Act, 21 U.S.C. section 360bbb-3(b)(1), unless the authorization is terminated or revoked.  Performed at [facility], [address], [place], [place] [address]   Surgical pcr screen     Status: None   Collection Time: 06/21/21  1:05 PM   Specimen: Nasal Mucosa; Nasal Swab  Result Value Ref Range Status   MRSA, PCR NEGATIVE NEGATIVE Final   Staphylococcus aureus NEGATIVE NEGATIVE Final    Comment: (NOTE) The Xpert SA Assay (FDA approved for NASAL specimens in patients 31 years of age and older), is one component of a comprehensive surveillance program. It is not intended to diagnose infection nor to guide or monitor treatment. Performed at [facility], [address], [place], [place] [address]       Radiology Studies: Pelvis Portable  Result Date: 06/21/2021 CLINICAL DATA:  Postop EXAM: PORTABLE PELVIS 1-2 VIEWS COMPARISON:  06/19/2021 FINDINGS: Interval right hip replacement with intact hardware and normal alignment. Gas in the soft tissues consistent with recent surgery. Vascular calcifications IMPRESSION: Right hip replacement with expected postsurgical change Electronically Signed   By: Jasmine Pang M.D.   On: 06/21/2021 19:37   DG Chest Port 1 View  Result Date: 06/21/2021 CLINICAL DATA:  Status post right hip replacement and respiratory compromise, initial encounter EXAM: PORTABLE CHEST 1 VIEW COMPARISON:  06/19/2021 FINDINGS: Cardiac shadow is stable. Aortic calcifications are noted. Increased vascular congestion is noted with mild interstitial edema. Mild basilar atelectasis is noted as well. No bony abnormality is seen. IMPRESSION: Changes consistent with mild CHF. Electronically Signed   By: Alcide Clever M.D.   On: 06/21/2021 21:14   DG C-Arm 1-60 Min-No Report  Result Date: 06/21/2021 Fluoroscopy was utilized by the requesting physician.  No radiographic interpretation.   DG C-Arm  1-60 Min-No Report  Result Date: 06/21/2021 Fluoroscopy was utilized by the requesting physician.  No radiographic interpretation.   ECHOCARDIOGRAM COMPLETE  Result Date: 06/22/2021    ECHOCARDIOGRAM REPORT   Patient Name:   Ralph Jackson Date of Exam: 06/22/2021 Medical Rec #:  [id number]           Height:       61.0 in Accession #:    [id number]          Weight:       113.1 lb Date of Birth:  November 20, 1945          BSA:          1.482 m Patient Age:  74 years            BP:           112/65 mmHg Patient Gender: M                   HR:           86 bpm. Exam Location:  Inpatient Procedure: 2D Echo, Cardiac Doppler and Color Doppler Indications:    CHF-Acute Systolic I50.21  History:        Patient has no prior history of Echocardiogram examinations.                 Risk Factors:ETOH and Hypertension.  Sonographer:    Eulah Pont RDCS Referring Phys: [id number] K Mantaj Chamberlin IMPRESSIONS  1. Severe proximal septal thickening with chordal SAM; elevated LVOT gradient at rest (2.3 m/s).  2. Left ventricular ejection fraction, by estimation, is 60 to 65%. The left ventricle has normal function. The left ventricle has no regional wall motion abnormalities. There is severe left ventricular hypertrophy of the basal-septal segment. Left ventricular diastolic parameters are consistent with Grade I diastolic dysfunction (impaired relaxation).  3. Right ventricular systolic function is normal. The right ventricular size is normal. There is mildly elevated pulmonary artery systolic pressure.  4. Left atrial size was mildly dilated.  5. The mitral valve is normal in structure. No evidence of mitral valve regurgitation. No evidence of mitral stenosis.  6. The aortic valve is tricuspid. Aortic valve regurgitation is trivial. No aortic stenosis is present.  7. The inferior vena cava is normal in size with greater than 50% respiratory variability, suggesting right atrial pressure of 3 mmHg. Comparison(s): No prior  Echocardiogram. FINDINGS  Left Ventricle: Left ventricular ejection fraction, by estimation, is 60 to 65%. The left ventricle has normal function. The left ventricle has no regional wall motion abnormalities. The left ventricular internal cavity size was normal in size. There is  severe left ventricular hypertrophy of the basal-septal segment. Left ventricular diastolic parameters are consistent with Grade I diastolic dysfunction (impaired relaxation). Right Ventricle: The right ventricular size is normal. Right ventricular systolic function is normal. There is mildly elevated pulmonary artery systolic pressure. The tricuspid regurgitant velocity is 3.19 m/s, and with an assumed right atrial pressure of 3 mmHg, the estimated right ventricular systolic pressure is 43.7 mmHg. Left Atrium: Left atrial size was mildly dilated. Right Atrium: Right atrial size was normal in size. Pericardium: There is no evidence of pericardial effusion. Mitral Valve: The mitral valve is normal in structure. No evidence of mitral valve regurgitation. No evidence of mitral valve stenosis. Tricuspid Valve: The tricuspid valve is normal in structure. Tricuspid valve regurgitation is mild . No evidence of tricuspid stenosis. Aortic Valve: The aortic valve is tricuspid. Aortic valve regurgitation is trivial. No aortic stenosis is present. Pulmonic Valve: The pulmonic valve was normal in structure. Pulmonic valve regurgitation is trivial. No evidence of pulmonic stenosis. Aorta: The aortic root is normal in size and structure. Venous: The inferior vena cava is normal in size with greater than 50% respiratory variability, suggesting right atrial pressure of 3 mmHg. IAS/Shunts: No atrial level shunt detected by color flow Doppler. Additional Comments: Severe proximal septal thickening with chordal SAM; elevated LVOT gradient at rest (2.3 m/s).  LEFT VENTRICLE PLAX 2D LVIDd:         3.30 cm   Diastology LVIDs:         2.00 cm   LV e' medial:  6.75 cm/s LV PW:         0.90 cm   LV E/e' medial:  9.5 LV IVS:        1.30 cm   LV e' lateral:   8.06 cm/s LVOT diam:     1.80 cm   LV E/e' lateral: 8.0 LV SV:         75 LV SV Index:   51 LVOT Area:     2.54 cm  RIGHT VENTRICLE RV S prime:     11.70 cm/s TAPSE (M-mode): 1.9 cm LEFT ATRIUM             Index        RIGHT ATRIUM           Index LA diam:        2.80 cm 1.89 cm/m   RA Area:     12.80 cm LA Vol (A2C):   41.3 ml 27.86 ml/m  RA Volume:   31.20 ml  21.05 ml/m LA Vol (A4C):   46.7 ml 31.50 ml/m LA Biplane Vol: 43.8 ml 29.55 ml/m  AORTIC VALVE LVOT Vmax:   174.00 cm/s LVOT Vmean:  113.000 cm/s LVOT VTI:    0.295 m  AORTA Ao Root diam: 3.60 cm Ao Asc diam:  3.70 cm MITRAL VALVE               TRICUSPID VALVE MV Area (PHT): 2.86 cm    TR Peak grad:   40.7 mmHg MV Decel Time: 265 msec    TR Vmax:        319.00 cm/s MV E velocity: 64.10 cm/s MV A velocity: 62.80 cm/s  SHUNTS MV E/A ratio:  1.02        Systemic VTI:  0.30 m                            Systemic Diam: 1.80 cm Olga Millers MD Electronically signed by Olga Millers MD Signature Date/Time: 06/22/2021/4:15:17 PM    Final    DG HIP UNILAT WITH PELVIS 2-3 VIEWS RIGHT  Result Date: 06/21/2021 CLINICAL DATA:  Right hip arthroplasty EXAM: DG HIP (WITH OR WITHOUT PELVIS) 2-3V RIGHT COMPARISON:  06/19/2021 FINDINGS: Five fluoroscopic images are obtained during the performance of the procedure and are provided for interpretation only. A subcapital right femoral neck fracture is again identified. Subsequent placement of a right hip arthroplasty in the expected position without signs of acute complication. Please refer to the operative report. FLUOROSCOPY TIME:  20 seconds IMPRESSION: 1. Right hip arthroplasty as above. Please refer to the operative report. Electronically Signed   By: Sharlet Salina M.D.   On: 06/21/2021 18:58    Scheduled Meds:  (feeding supplement) PROSource Plus  30 mL Oral Daily   aspirin EC  325 mg Oral Q breakfast    Chlorhexidine Gluconate Cloth  6 each Topical Daily   docusate sodium  100 mg Oral BID   escitalopram  20 mg Oral Daily   feeding supplement  1 Container Oral Q24H   feeding supplement  237 mL Oral BID BM   levothyroxine  50 mcg Oral Daily   loratadine  10 mg Oral Daily   mouth rinse  15 mL Mouth Rinse BID   metoprolol tartrate  25 mg Oral BID   multivitamin with minerals  1 tablet Oral Daily   pantoprazole  40 mg Oral Daily   phosphorus  500 mg Oral BID  pravastatin  10 mg Oral QHS   senna  1 tablet Oral BID   Continuous Infusions:  sodium chloride Stopped (06/22/21 1430)     LOS: 4 days   Rickey Barbara, MD [facility] Pager On Amion  If 7PM-7AM, please contact night-coverage 06/23/2021, 3:54 PM

## 2021-06-24 DIAGNOSIS — J9601 Acute respiratory failure with hypoxia: Secondary | ICD-10-CM | POA: Diagnosis not present

## 2021-06-24 DIAGNOSIS — S72001A Fracture of unspecified part of neck of right femur, initial encounter for closed fracture: Secondary | ICD-10-CM | POA: Diagnosis not present

## 2021-06-24 LAB — CBC
HCT: 26 % — ABNORMAL LOW (ref 39.0–52.0)
Hemoglobin: 8.9 g/dL — ABNORMAL LOW (ref 13.0–17.0)
MCH: 30.8 pg (ref 26.0–34.0)
MCHC: 34.2 g/dL (ref 30.0–36.0)
MCV: 90 fL (ref 80.0–100.0)
Platelets: 201 10*3/uL (ref 150–400)
RBC: 2.89 MIL/uL — ABNORMAL LOW (ref 4.22–5.81)
RDW: 13.9 % (ref 11.5–15.5)
WBC: 11.2 10*3/uL — ABNORMAL HIGH (ref 4.0–10.5)
nRBC: 0 % (ref 0.0–0.2)

## 2021-06-24 MED ORDER — FUROSEMIDE 10 MG/ML IJ SOLN
20.0000 mg | Freq: Once | INTRAMUSCULAR | Status: AC
  Administered 2021-06-24: 20 mg via INTRAVENOUS
  Filled 2021-06-24: qty 2

## 2021-06-24 NOTE — Progress Notes (Signed)
PROGRESS NOTE    Ralph Jackson  NOI:[id number] DOB: 1946/01/19 DOA: 06/19/2021 PCP: Ronnald Collum    Brief Narrative:  75 y.o. male with medical history significant of prior EtOH abuse, in remission for several years now. Pt reportedly was taking out trash, carying boxes and couldn't see steps.  Missed step, fell down about 5 stairs, landed on R hip.  Severe R hip pain after fall, unable to ambulate or bear wt.  Symptoms constant, worse with movement.  In the ED, pt was found to have a R femoral neck fracture. Pt was admitted and orthopedic surgery consulted  Assessment & Plan:   Principal Problem:   Closed displaced fracture of right femoral neck (HCC) Active Problems:   Alcohol abuse, in remission   Alcoholic dementia (HCC)   HTN (hypertension)   Acute respiratory failure with hypoxia (HCC)   R femoral neck fx Orthopedic surgery was consulted, now s/p surgery 10/20 Continue with analgesia as needed Therapy recs were noted for HHPT at time of d/c Prior EtOH abuse - Sober for several years now Does have mild EtOH dementia after 50 years of heavy drinking Without evidence of withdrawals at this time Pt is conversing appropriately and seems to have insight HTN  Cont metoprolol as tolerated, dose was decreased to 12.5mg  given soft BP, to better allow for diuresis below Recent Dehydration Pt was recently noted to be markedly dehydrated with dry mucous membranes, poor skin turgor with decreased urine output, improved with fluids Given concerns of acute resp failure, basal fluids now on hold Acute hypoxemic respiratory failure Evidence of pulm edema on CXR Pt was transferred to ICU, improved after trial of lasix Cont to wean O2 as tolerated Appreciate assistance by PCCM Improving with diuresis   DVT prophylaxis: SCD's Code Status: Full Family Communication: Pt in room, family not at bedside  Status is: Inpatient  Remains inpatient appropriate because:  severity of illness    Consultants:  Orthopedic Surgery PCCM  Procedures:  Right total hip arthroplasty, anterior approach  Antimicrobials: Anti-infectives (From admission, onward)    Start     Dose/Rate Route Frequency Ordered Stop   06/21/21 2300  ceFAZolin (ANCEF) IVPB 2g/100 mL premix        2 g 200 mL/hr over 30 Minutes Intravenous Every 6 hours 06/21/21 2128 06/22/21 0534   06/21/21 1530  ceFAZolin (ANCEF) IVPB 2g/100 mL premix        2 g 200 mL/hr over 30 Minutes Intravenous On call to O.R. 06/21/21 1153 06/21/21 1700       Subjective: Reports breathing better today. Eager to go home soon  Objective: Vitals:   06/24/21 1100 06/24/21 1200 06/24/21 1300 06/24/21 1349  BP: (!) 88/51 (!) 88/53 (!) 89/52   Pulse: 95 79 73   Resp: (!) 23 17 (!) 22   Temp:    97.7 F (36.5 C)  TempSrc:    Oral  SpO2: 95% 97% 95%   Weight:      Height:        Intake/Output Summary (Last 24 hours) at 06/24/2021 1446 Last data filed at 06/24/2021 1000 Gross per 24 hour  Intake 477 ml  Output 1475 ml  Net -998 ml    Filed Weights   06/20/21 0006 06/21/21 1458 06/21/21 2110  Weight: 45.4 kg 45.4 kg 51.3 kg    Examination: General exam: Awake, laying in bed, in nad Respiratory system: Normal respiratory effort, no wheezing Cardiovascular system: regular rate, s1, s2 Gastrointestinal  system: Soft, nondistended, positive BS Central nervous system: CN2-12 grossly intact, strength intact Extremities: Perfused, no clubbing Skin: Normal skin turgor, no notable skin lesions seen Psychiatry: Mood normal // no visual hallucinations   Data Reviewed: I have personally reviewed following labs and imaging studies  CBC: Recent Labs  Lab 06/19/21 2205 06/21/21 0401 06/22/21 0305 06/23/21 0310 06/24/21 0253  WBC 16.8* 13.8* 14.9* 12.6* 11.2*  NEUTROABS 14.0*  --   --   --   --   HGB 12.8* 11.5* 10.7* 9.0* 8.9*  HCT 38.9* 35.0* 32.5* 26.8* 26.0*  MCV 93.1 93.8 94.2 91.8 90.0   PLT 266 199 174 176 201    Basic Metabolic Panel: Recent Labs  Lab 06/19/21 2205 06/21/21 0401 06/22/21 0305 06/23/21 0310  NA 138 138 136 137  K 3.8 4.7 3.8 3.9  CL 102 107 103 103  CO2 25 27 24 29   GLUCOSE 133* 109* 145* 105*  BUN 18 18 19 22   CREATININE 1.00 0.63 0.66  CALCIUM 8.8* 7.7* 7.5* 7.8*  MG  --   --   --  1.7  PHOS  --   --   --  <1.0*    GFR: Estimated Creatinine Clearance: 58.8 mL/min (by C-G formula based on SCr of 0.66 mg/dL). Liver Function Tests: Recent Labs  Lab 06/21/21 0401  AST 20  ALT 14  ALKPHOS 53  BILITOT 0.4  PROT 5.8*  ALBUMIN 3.1*    No results for input(s): LIPASE, AMYLASE in the last 168 hours. No results for input(s): AMMONIA in the last 168 hours. Coagulation Profile: Recent Labs  Lab 06/19/21 2205  INR 0.9    Cardiac Enzymes: No results for input(s): CKTOTAL, CKMB, CKMBINDEX, TROPONINI in the last 168 hours. BNP (last 3 results) No results for input(s): PROBNP in the last 8760 hours. HbA1C: No results for input(s): HGBA1C in the last 72 hours. CBG: Recent Labs  Lab 06/21/21 2147  GLUCAP 112*    Lipid Profile: No results for input(s): CHOL, HDL, LDLCALC, TRIG, CHOLHDL, LDLDIRECT in the last 72 hours. Thyroid Function Tests: No results for input(s): TSH, T4TOTAL, FREET4, T3FREE, THYROIDAB in the last 72 hours.  Anemia Panel: No results for input(s): VITAMINB12, FOLATE, FERRITIN, TIBC, IRON, RETICCTPCT in the last 72 hours. Sepsis Labs: Recent Labs  Lab 06/23/21 0310  LATICACIDVEN 1.1     Recent Results (from the past 240 hour(s))  Resp Panel by RT-PCR (Flu A&B, Covid) Nasopharyngeal Swab     Status: None   Collection Time: 06/19/21 10:20 PM   Specimen: Nasopharyngeal Swab; Nasopharyngeal(NP) swabs in vial transport medium  Result Value Ref Range Status   SARS Coronavirus 2 by RT PCR NEGATIVE NEGATIVE Final    Comment: (NOTE) SARS-CoV-2 target nucleic acids are NOT DETECTED.  The SARS-CoV-2 RNA is  generally detectable in upper respiratory specimens during the acute phase of infection. The lowest concentration of SARS-CoV-2 viral copies this assay can detect is 138 copies/mL. A negative result does not preclude SARS-Cov-2 infection and should not be used as the sole basis for treatment or other patient management decisions. A negative result may occur with  improper specimen collection/handling, submission of specimen other than nasopharyngeal swab, presence of viral mutation(s) within the areas targeted by this assay, and inadequate number of viral copies(<138 copies/mL). A negative result must be combined with clinical observations, patient history, and epidemiological information. The expected result is Negative.  Fact Sheet for Patients:  06/25/21  Fact Sheet for Healthcare Providers:  SeriousBroker.it  This test is no t yet approved or cleared by the [place] and  has been authorized for detection and/or diagnosis of SARS-CoV-2 by FDA under an Emergency Use Authorization (EUA). This EUA will remain  in effect (meaning this test can be used) for the duration of the COVID-19 declaration under Section 564(b)(1) of the Act, 21 U.S.C.section 360bbb-3(b)(1), unless the authorization is terminated  or revoked sooner.       Influenza A by PCR NEGATIVE NEGATIVE Final   Influenza B by PCR NEGATIVE NEGATIVE Final    Comment: (NOTE) The Xpert Xpress SARS-CoV-2/FLU/RSV plus assay is intended as an aid in the diagnosis of influenza from Nasopharyngeal swab specimens and should not be used as a sole basis for treatment. Nasal washings and aspirates are unacceptable for Xpert Xpress SARS-CoV-2/FLU/RSV testing.  Fact Sheet for Patients: BloggerCourse.com  Fact Sheet for Healthcare Providers: SeriousBroker.it  This test is not yet approved or cleared by the [place] FDA and has been authorized for detection and/or diagnosis of SARS-CoV-2 by FDA under an Emergency Use Authorization (EUA). This EUA will remain in effect (meaning this test can be used) for the duration of the COVID-19 declaration under Section 564(b)(1) of the Act, 21 U.S.C. section 360bbb-3(b)(1), unless the authorization is terminated or revoked.  Performed at [facility], [address], [place], [place] [address]   Surgical pcr screen     Status: None   Collection Time: 06/21/21  1:05 PM   Specimen: Nasal Mucosa; Nasal Swab  Result Value Ref Range Status   MRSA, PCR NEGATIVE NEGATIVE Final   Staphylococcus aureus NEGATIVE NEGATIVE Final    Comment: (NOTE) The Xpert SA Assay (FDA approved for NASAL specimens in patients 30 years of age and older), is one component of a comprehensive surveillance program. It is not intended to diagnose infection nor to guide or monitor treatment. Performed at [facility], [address] [place], [place] [address]       Radiology Studies: ECHOCARDIOGRAM COMPLETE  Result Date: 06/22/2021    ECHOCARDIOGRAM REPORT   Patient Name:   DECLIN KRAS Date of Exam: 06/22/2021 Medical Rec #:  [id number]           Height:       61.0 in Accession #:    [id number]          Weight:       113.1 lb Date of Birth:  [birth year]          BSA:          1.482 m Patient Age:    74 years            BP:           112/65 mmHg Patient Gender: M                   HR:           86 bpm. Exam Location:  Inpatient Procedure: 2D Echo, Cardiac Doppler and Color Doppler Indications:    CHF-Acute Systolic I50.21  History:        Patient has no prior history of Echocardiogram examinations.                 Risk Factors:ETOH and Hypertension.  Sonographer:    Eulah Pont RDCS Referring Phys: [id number] Jaelynne Hockley K Altair Stanko IMPRESSIONS  1. Severe proximal septal thickening with chordal SAM; elevated LVOT gradient at rest (2.3 m/s).  2.  Left ventricular  ejection fraction, by estimation, is 60 to 65%. The left ventricle has normal function. The left ventricle has no regional wall motion abnormalities. There is severe left ventricular hypertrophy of the basal-septal segment. Left ventricular diastolic parameters are consistent with Grade I diastolic dysfunction (impaired relaxation).  3. Right ventricular systolic function is normal. The right ventricular size is normal. There is mildly elevated pulmonary artery systolic pressure.  4. Left atrial size was mildly dilated.  5. The mitral valve is normal in structure. No evidence of mitral valve regurgitation. No evidence of mitral stenosis.  6. The aortic valve is tricuspid. Aortic valve regurgitation is trivial. No aortic stenosis is present.  7. The inferior vena cava is normal in size with greater than 50% respiratory variability, suggesting right atrial pressure of 3 mmHg. Comparison(s): No prior Echocardiogram. FINDINGS  Left Ventricle: Left ventricular ejection fraction, by estimation, is 60 to 65%. The left ventricle has normal function. The left ventricle has no regional wall motion abnormalities. The left ventricular internal cavity size was normal in size. There is  severe left ventricular hypertrophy of the basal-septal segment. Left ventricular diastolic parameters are consistent with Grade I diastolic dysfunction (impaired relaxation). Right Ventricle: The right ventricular size is normal. Right ventricular systolic function is normal. There is mildly elevated pulmonary artery systolic pressure. The tricuspid regurgitant velocity is 3.19 m/s, and with an assumed right atrial pressure of 3 mmHg, the estimated right ventricular systolic pressure is 43.7 mmHg. Left Atrium: Left atrial size was mildly dilated. Right Atrium: Right atrial size was normal in size. Pericardium: There is no evidence of pericardial effusion. Mitral Valve: The mitral valve is normal in structure. No evidence of mitral valve  regurgitation. No evidence of mitral valve stenosis. Tricuspid Valve: The tricuspid valve is normal in structure. Tricuspid valve regurgitation is mild . No evidence of tricuspid stenosis. Aortic Valve: The aortic valve is tricuspid. Aortic valve regurgitation is trivial. No aortic stenosis is present. Pulmonic Valve: The pulmonic valve was normal in structure. Pulmonic valve regurgitation is trivial. No evidence of pulmonic stenosis. Aorta: The aortic root is normal in size and structure. Venous: The inferior vena cava is normal in size with greater than 50% respiratory variability, suggesting right atrial pressure of 3 mmHg. IAS/Shunts: No atrial level shunt detected by color flow Doppler. Additional Comments: Severe proximal septal thickening with chordal SAM; elevated LVOT gradient at rest (2.3 m/s).  LEFT VENTRICLE PLAX 2D LVIDd:         3.30 cm   Diastology LVIDs:         2.00 cm   LV e' medial:    6.75 cm/s LV PW:         0.90 cm   LV E/e' medial:  9.5 LV IVS:        1.30 cm   LV e' lateral:   8.06 cm/s LVOT diam:     1.80 cm   LV E/e' lateral: 8.0 LV SV:         75 LV SV Index:   51 LVOT Area:     2.54 cm  RIGHT VENTRICLE RV S prime:     11.70 cm/s TAPSE (M-mode): 1.9 cm LEFT ATRIUM             Index        RIGHT ATRIUM           Index LA diam:        2.80 cm 1.89 cm/m   RA  Area:     12.80 cm LA Vol (A2C):   41.3 ml 27.86 ml/m  RA Volume:   31.20 ml  21.05 ml/m LA Vol (A4C):   46.7 ml 31.50 ml/m LA Biplane Vol: 43.8 ml 29.55 ml/m  AORTIC VALVE LVOT Vmax:   174.00 cm/s LVOT Vmean:  113.000 cm/s LVOT VTI:    0.295 m  AORTA Ao Root diam: 3.60 cm Ao Asc diam:  3.70 cm MITRAL VALVE               TRICUSPID VALVE MV Area (PHT): 2.86 cm    TR Peak grad:   40.7 mmHg MV Decel Time: 265 msec    TR Vmax:        319.00 cm/s MV E velocity: 64.10 cm/s MV A velocity: 62.80 cm/s  SHUNTS MV E/A ratio:  1.02        Systemic VTI:  0.30 m                            Systemic Diam: 1.80 cm Olga Millers MD Electronically  signed by Olga Millers MD Signature Date/Time: 06/22/2021/4:15:17 PM    Final     Scheduled Meds:  (feeding supplement) PROSource Plus  30 mL Oral Daily   aspirin EC  325 mg Oral Q breakfast   Chlorhexidine Gluconate Cloth  6 each Topical Daily   docusate sodium  100 mg Oral BID   escitalopram  20 mg Oral Daily   feeding supplement  1 Container Oral Q24H   feeding supplement  237 mL Oral BID BM   levothyroxine  50 mcg Oral Daily   loratadine  10 mg Oral Daily   mouth rinse  15 mL Mouth Rinse BID   metoprolol tartrate  12.5 mg Oral BID   multivitamin with minerals  1 tablet Oral Daily   pantoprazole  40 mg Oral Daily   pravastatin  10 mg Oral QHS   senna  1 tablet Oral BID   Continuous Infusions:  sodium chloride Stopped (06/22/21 1430)     LOS: 5 days   Rickey Barbara, MD [facility] Pager On Amion  If 7PM-7AM, please contact night-coverage 06/24/2021, 2:46 PM

## 2021-06-24 NOTE — Patient Care Conference (Signed)
Called patient's daughter at number listed to give update. No answer. Will try again later.

## 2021-06-25 DIAGNOSIS — J9601 Acute respiratory failure with hypoxia: Secondary | ICD-10-CM | POA: Diagnosis not present

## 2021-06-25 DIAGNOSIS — S72001A Fracture of unspecified part of neck of right femur, initial encounter for closed fracture: Secondary | ICD-10-CM | POA: Diagnosis not present

## 2021-06-25 DIAGNOSIS — F1011 Alcohol abuse, in remission: Secondary | ICD-10-CM | POA: Diagnosis not present

## 2021-06-25 LAB — COMPREHENSIVE METABOLIC PANEL
ALT: 15 U/L (ref 0–44)
AST: 29 U/L (ref 15–41)
Albumin: 2.4 g/dL — ABNORMAL LOW (ref 3.5–5.0)
Alkaline Phosphatase: 43 U/L (ref 38–126)
Anion gap: 5 (ref 5–15)
BUN: 20 mg/dL (ref 8–23)
CO2: 36 mmol/L — ABNORMAL HIGH (ref 22–32)
Calcium: 8.1 mg/dL — ABNORMAL LOW (ref 8.9–10.3)
Chloride: 95 mmol/L — ABNORMAL LOW (ref 98–111)
Creatinine, Ser: 0.67 mg/dL (ref 0.61–1.24)
GFR, Estimated: >60 mL/min (ref >60)
Glucose, Bld: 93 mg/dL (ref 70–99)
Potassium: 3.3 mmol/L — ABNORMAL LOW (ref 3.5–5.1)
Sodium: 136 mmol/L (ref 135–145)
Total Bilirubin: 0.5 mg/dL (ref 0.3–1.2)
Total Protein: 5.5 g/dL — ABNORMAL LOW (ref 6.5–8.1)

## 2021-06-25 LAB — CBC
HCT: 26.6 % — ABNORMAL LOW (ref 39.0–52.0)
Hemoglobin: 8.9 g/dL — ABNORMAL LOW (ref 13.0–17.0)
MCH: 30.7 pg (ref 26.0–34.0)
MCHC: 33.5 g/dL (ref 30.0–36.0)
MCV: 91.7 fL (ref 80.0–100.0)
Platelets: 220 10*3/uL (ref 150–400)
RBC: 2.9 MIL/uL — ABNORMAL LOW (ref 4.22–5.81)
RDW: 13.8 % (ref 11.5–15.5)
WBC: 9.4 10*3/uL (ref 4.0–10.5)
nRBC: 0 % (ref 0.0–0.2)

## 2021-06-25 MED ORDER — POTASSIUM CHLORIDE CRYS ER 20 MEQ PO TBCR
60.0000 meq | EXTENDED_RELEASE_TABLET | Freq: Once | ORAL | Status: AC
  Administered 2021-06-25: 60 meq via ORAL
  Filled 2021-06-25: qty 3

## 2021-06-25 NOTE — Progress Notes (Signed)
Physical Therapy Treatment Patient Details Name: Ralph Jackson MRN: [id number] DOB: Nov 19, 1945 Today's Date: 06/25/2021   History of Present Illness Pt is a 75 year old male s/p Right THA direct anterior approach due to fracture from fall prior to admission and had Postop hypoxemia.  PMHx significant for ETOH abuse (several years in remission), HTN    PT Comments    Pt assisted with ambulating in hallway and required supplemental oxygen.  Pt plans to d/c home with assist from daughter.  SATURATION QUALIFICATIONS: (This note is used to comply with regulatory documentation for home oxygen)  Patient Saturations on Room Air at Rest = 95%  Patient Saturations on Room Air while Ambulating = 83%  Patient Saturations on 1 Liters of oxygen while Ambulating = 92%  Please briefly explain why patient needs home oxygen: to improve oxygen saturations during physical activities such as ambulation.   Recommendations for follow up therapy are one component of a multi-disciplinary discharge planning process, led by the attending physician.  Recommendations may be updated based on patient status, additional functional criteria and insurance authorization.  Follow Up Recommendations  Home health PT     Assistance Recommended at Discharge Intermittent Supervision/Assistance  Equipment Recommendations  None recommended by PT    Recommendations for Other Services       Precautions / Restrictions Precautions Precautions: Fall Precaution Comments: monitor HR and sats Restrictions RLE Weight Bearing: Weight bearing as tolerated     Mobility  Bed Mobility Overal bed mobility: Needs Assistance Bed Mobility: Supine to Sit     Supine to sit: Min assist;HOB elevated     General bed mobility comments: assist for R LE over EOB    Transfers Overall transfer level: Needs assistance Equipment used: Rolling walker (2 wheels) Transfers: Sit to/from Stand Sit to Stand: Min assist            General transfer comment: verbal cues for safe technique, assist to rise and steady    Ambulation/Gait Ambulation/Gait assistance: Min guard;Min assist Gait Distance (Feet): 120 Feet Assistive device: Rolling walker (2 wheels) Gait Pattern/deviations: Step-to pattern;Decreased stance time - right;Antalgic;Trunk flexed     General Gait Details: max cues for step length and RW positioning (pt tends to keep too far forward), pt required supplemental oxygen, HR 114 bpm   Stairs             Wheelchair Mobility    Modified Rankin (Stroke Patients Only)       Balance                                            Cognition Arousal/Alertness: Awake/alert Behavior During Therapy: WFL for tasks assessed/performed Overall Cognitive Status: Within Functional Limits for tasks assessed                                          Exercises      General Comments        Pertinent Vitals/Pain Pain Assessment: Faces Faces Pain Scale: Hurts little more Pain Location: right hip Pain Descriptors / Indicators: Sore Pain Intervention(s): Repositioned;Monitored during session;Premedicated before session    Home Living  Prior Function            PT Goals (current goals can now be found in the care plan section) Progress towards PT goals: Progressing toward goals    Frequency    Min 3X/week      PT Plan Current plan remains appropriate    Co-evaluation              AM-PAC PT "6 Clicks" Mobility   Outcome Measure  Help needed turning from your back to your side while in a flat bed without using bedrails?: A Little Help needed moving from lying on your back to sitting on the side of a flat bed without using bedrails?: A Little Help needed moving to and from a bed to a chair (including a wheelchair)?: A Little Help needed standing up from a chair using your arms (e.g., wheelchair or bedside  chair)?: A Little Help needed to walk in hospital room?: A Little Help needed climbing 3-5 steps with a railing? : A Lot 6 Click Score: 17    End of Session Equipment Utilized During Treatment: Gait belt;Oxygen Activity Tolerance: Patient tolerated treatment well Patient left: in chair;with call bell/phone within reach;with chair alarm set Nurse Communication: Mobility status PT Visit Diagnosis: Difficulty in walking, not elsewhere classified (R26.2)     Time: 1105-1130 PT Time Calculation (min) (ACUTE ONLY): 25 min  Charges:  $Gait Training: 8-22 mins                    Paulino Door, DPT Acute Rehabilitation Services Pager: [phone] Office: [phone]    [name] 06/25/2021, 1:53 PM

## 2021-06-25 NOTE — Care Management Important Message (Signed)
Important Message  Patient Details IM Letter given to the Patient. Name: IGNACIO LOWDER MRN: [id number] Date of Birth: 03/23/1946   Medicare Important Message Given:  Yes     Caren Macadam 06/25/2021, 11:37 AM

## 2021-06-25 NOTE — Progress Notes (Signed)
PROGRESS NOTE    Ralph Jackson  VEL:[id number] DOB: [birth year] DOA: 06/19/2021 PCP: Ronnald Collum    Brief Narrative:  75 y.o. male with medical history significant of prior EtOH abuse, in remission for several years now. Pt reportedly was taking out trash, carying boxes and couldn't see steps.  Missed step, fell down about 5 stairs, landed on R hip.  Severe R hip pain after fall, unable to ambulate or bear wt.  Symptoms constant, worse with movement.  In the ED, pt was found to have a R femoral neck fracture. Pt was admitted and orthopedic surgery consulted  Assessment & Plan:   Principal Problem:   Closed displaced fracture of right femoral neck (HCC) Active Problems:   Alcohol abuse, in remission   Alcoholic dementia (HCC)   HTN (hypertension)   Acute respiratory failure with hypoxia (HCC)   R femoral neck fx Orthopedic surgery was consulted, now s/p surgery 10/20 Continue with analgesia as needed Therapy recs were noted for HHPT at the time of d/c Prior EtOH abuse - Sober for several years now Does have mild EtOH dementia after 50 years of heavy drinking Without evidence of withdrawals at this time Continues to converse appropriately with insight to his care HTN  Cont metoprolol as tolerated, with dose decreased to 12.5mg  to better allow for diuresis below Recent Dehydration Pt was recently noted to be markedly dehydrated with dry mucous membranes, poor skin turgor with decreased urine output, improved with fluids Given concerns of acute resp failure, basal fluids now on hold Acute hypoxemic respiratory failure Evidence of pulm edema on recent CXR Pt was transferred to ICU, improved after trial of lasix, not back to medical floor Cont to wean O2 as tolerated Per PT, pt desaturates to 83% on RA while ambulating Improving with diuresis as tolerated   DVT prophylaxis: SCD's Code Status: Full Family Communication: Pt in room, talked to daughter over  phone  Status is: Inpatient  Remains inpatient appropriate because: severity of illness    Consultants:  Orthopedic Surgery PCCM  Procedures:  Right total hip arthroplasty, anterior approach  Antimicrobials: Anti-infectives (From admission, onward)    Start     Dose/Rate Route Frequency Ordered Stop   06/21/21 2300  ceFAZolin (ANCEF) IVPB 2g/100 mL premix        2 g 200 mL/hr over 30 Minutes Intravenous Every 6 hours 06/21/21 2128 06/22/21 0534   06/21/21 1530  ceFAZolin (ANCEF) IVPB 2g/100 mL premix        2 g 200 mL/hr over 30 Minutes Intravenous On call to O.R. 06/21/21 1153 06/21/21 1700       Subjective: States breathing better, eager to go home soon  Objective: Vitals:   06/24/21 2151 06/25/21 0112 06/25/21 0525 06/25/21 1348  BP: 128/78 108/67 112/74 106/69  Pulse: 79 88 74 79  Resp:  18 16 16   Temp: 97.9 F (36.6 C) 99.1 F (37.3 C) 97.8 F (36.6 C) 97.9 F (36.6 C)  TempSrc: Oral Oral Oral Oral  SpO2: 95% 98% 99% 97%  Weight:      Height:        Intake/Output Summary (Last 24 hours) at 06/25/2021 1643 Last data filed at 06/25/2021 1500 Gross per 24 hour  Intake 360 ml  Output 1300 ml  Net -940 ml    Filed Weights   06/20/21 0006 06/21/21 1458 06/21/21 2110  Weight: 45.4 kg 45.4 kg 51.3 kg    Examination: General exam: Conversant, in no acute  distress Respiratory system: normal chest rise, clear, no audible wheezing Cardiovascular system: regular rhythm, s1-s2 Gastrointestinal system: Nondistended, nontender, pos BS Central nervous system: No seizures, no tremors Extremities: No cyanosis, no joint deformities Skin: No rashes, no pallor Psychiatry: Affect normal // no auditory hallucinations   Data Reviewed: I have personally reviewed following labs and imaging studies  CBC: Recent Labs  Lab 06/19/21 2205 06/21/21 0401 06/22/21 0305 06/23/21 0310 06/24/21 0253 06/25/21 0522  WBC 16.8* 13.8* 14.9* 12.6* 11.2* 9.4  NEUTROABS  14.0*  --   --   --   --   --   HGB 12.8* 11.5* 10.7* 9.0* 8.9* 8.9*  HCT 38.9* 35.0* 32.5* 26.8* 26.0* 26.6*  MCV 93.1 93.8 94.2 91.8 90.0 91.7  PLT 266 199 174 176 201 220    Basic Metabolic Panel: Recent Labs  Lab 06/19/21 2205 06/21/21 0401 06/22/21 0305 06/23/21 0310 06/25/21 0522  NA 138 138 136 137 136  K 3.8 4.7 3.8 3.9 3.3*  CL 102 107 103 103 95*  CO2 25 27 24 29  36*  GLUCOSE 133* 109* 145* 105* 93  BUN 18 18 19 22 20   CREATININE 1.00 0.63 0.93 0.66 0.67  CALCIUM 8.8* 7.7* 7.5* 7.8* 8.1*  MG  --   --   --  1.7  --   PHOS  --   --   --  <1.0*  --     GFR: Estimated Creatinine Clearance: 58.8 mL/min (by C-G formula based on SCr of 0.67 mg/dL). Liver Function Tests: Recent Labs  Lab 06/21/21 0401 06/25/21 0522  AST 20 29  ALT 14 15  ALKPHOS 53 43  BILITOT 0.4 0.5  PROT 5.8* 5.5*  ALBUMIN 3.1* 2.4*    No results for input(s): LIPASE, AMYLASE in the last 168 hours. No results for input(s): AMMONIA in the last 168 hours. Coagulation Profile: Recent Labs  Lab 06/19/21 2205  INR 0.9    Cardiac Enzymes: No results for input(s): CKTOTAL, CKMB, CKMBINDEX, TROPONINI in the last 168 hours. BNP (last 3 results) No results for input(s): PROBNP in the last 8760 hours. HbA1C: No results for input(s): HGBA1C in the last 72 hours. CBG: Recent Labs  Lab 06/21/21 2147  GLUCAP 112*    Lipid Profile: No results for input(s): CHOL, HDL, LDLCALC, TRIG, CHOLHDL, LDLDIRECT in the last 72 hours. Thyroid Function Tests: No results for input(s): TSH, T4TOTAL, FREET4, T3FREE, THYROIDAB in the last 72 hours.  Anemia Panel: No results for input(s): VITAMINB12, FOLATE, FERRITIN, TIBC, IRON, RETICCTPCT in the last 72 hours. Sepsis Labs: Recent Labs  Lab 06/23/21 0310  LATICACIDVEN 1.1     Recent Results (from the past 240 hour(s))  Resp Panel by RT-PCR (Flu A&B, Covid) Nasopharyngeal Swab     Status: None   Collection Time: 06/19/21 10:20 PM   Specimen:  Nasopharyngeal Swab; Nasopharyngeal(NP) swabs in vial transport medium  Result Value Ref Range Status   SARS Coronavirus 2 by RT PCR NEGATIVE NEGATIVE Final    Comment: (NOTE) SARS-CoV-2 target nucleic acids are NOT DETECTED.  The SARS-CoV-2 RNA is generally detectable in upper respiratory specimens during the acute phase of infection. The lowest concentration of SARS-CoV-2 viral copies this assay can detect is 138 copies/mL. A negative result does not preclude SARS-Cov-2 infection and should not be used as the sole basis for treatment or other patient management decisions. A negative result may occur with  improper specimen collection/handling, submission of specimen other than nasopharyngeal swab, presence of viral mutation(s)  within the areas targeted by this assay, and inadequate number of viral copies(<138 copies/mL). A negative result must be combined with clinical observations, patient history, and epidemiological information. The expected result is Negative.  Fact Sheet for Patients:  BloggerCourse.com  Fact Sheet for Healthcare Providers:  SeriousBroker.it  This test is no t yet approved or cleared by the [place] FDA and  has been authorized for detection and/or diagnosis of SARS-CoV-2 by FDA under an Emergency Use Authorization (EUA). This EUA will remain  in effect (meaning this test can be used) for the duration of the COVID-19 declaration under Section 564(b)(1) of the Act, 21 U.S.C.section 360bbb-3(b)(1), unless the authorization is terminated  or revoked sooner.       Influenza A by PCR NEGATIVE NEGATIVE Final   Influenza B by PCR NEGATIVE NEGATIVE Final    Comment: (NOTE) The Xpert Xpress SARS-CoV-2/FLU/RSV plus assay is intended as an aid in the diagnosis of influenza from Nasopharyngeal swab specimens and should not be used as a sole basis for treatment. Nasal washings and aspirates are unacceptable for  Xpert Xpress SARS-CoV-2/FLU/RSV testing.  Fact Sheet for Patients: BloggerCourse.com  Fact Sheet for Healthcare Providers: SeriousBroker.it  This test is not yet approved or cleared by the [place] FDA and has been authorized for detection and/or diagnosis of SARS-CoV-2 by FDA under an Emergency Use Authorization (EUA). This EUA will remain in effect (meaning this test can be used) for the duration of the COVID-19 declaration under Section 564(b)(1) of the Act, 21 U.S.C. section 360bbb-3(b)(1), unless the authorization is terminated or revoked.  Performed at [facility], [address] [place], [place] [address]   Surgical pcr screen     Status: None   Collection Time: 06/21/21  1:05 PM   Specimen: Nasal Mucosa; Nasal Swab  Result Value Ref Range Status   MRSA, PCR NEGATIVE NEGATIVE Final   Staphylococcus aureus NEGATIVE NEGATIVE Final    Comment: (NOTE) The Xpert SA Assay (FDA approved for NASAL specimens in patients 67 years of age and older), is one component of a comprehensive surveillance program. It is not intended to diagnose infection nor to guide or monitor treatment. Performed at [facility], [address], [place], [place] [address]       Radiology Studies: No results found.  Scheduled Meds:  (feeding supplement) PROSource Plus  30 mL Oral Daily   aspirin EC  325 mg Oral Q breakfast   Chlorhexidine Gluconate Cloth  6 each Topical Daily   docusate sodium  100 mg Oral BID   escitalopram  20 mg Oral Daily   feeding supplement  1 Container Oral Q24H   feeding supplement  237 mL Oral BID BM   levothyroxine  50 mcg Oral Daily   loratadine  10 mg Oral Daily   mouth rinse  15 mL Mouth Rinse BID   metoprolol tartrate  12.5 mg Oral BID   multivitamin with minerals  1 tablet Oral Daily   pantoprazole  40 mg Oral Daily   pravastatin  10 mg Oral QHS   senna  1 tablet Oral BID    Continuous Infusions:  sodium chloride Stopped (06/22/21 1430)     LOS: 6 days   Rickey Barbara, MD [facility] Pager On Amion  If 7PM-7AM, please contact night-coverage 06/25/2021, 4:43 PM

## 2021-06-26 DIAGNOSIS — F1011 Alcohol abuse, in remission: Secondary | ICD-10-CM | POA: Diagnosis not present

## 2021-06-26 DIAGNOSIS — S72001A Fracture of unspecified part of neck of right femur, initial encounter for closed fracture: Secondary | ICD-10-CM | POA: Diagnosis not present

## 2021-06-26 LAB — COMPREHENSIVE METABOLIC PANEL
ALT: 18 U/L (ref 0–44)
AST: 29 U/L (ref 15–41)
Albumin: 2.5 g/dL — ABNORMAL LOW (ref 3.5–5.0)
Alkaline Phosphatase: 44 U/L (ref 38–126)
Anion gap: 8 (ref 5–15)
BUN: 24 mg/dL — ABNORMAL HIGH (ref 8–23)
CO2: 32 mmol/L (ref 22–32)
Calcium: 8.5 mg/dL — ABNORMAL LOW (ref 8.9–10.3)
Chloride: 98 mmol/L (ref 98–111)
Creatinine, Ser: 0.53 mg/dL — ABNORMAL LOW (ref 0.61–1.24)
GFR, Estimated: >60 mL/min (ref >60)
Glucose, Bld: 85 mg/dL (ref 70–99)
Potassium: 5.1 mmol/L (ref 3.5–5.1)
Sodium: 138 mmol/L (ref 135–145)
Total Bilirubin: 0.4 mg/dL (ref 0.3–1.2)
Total Protein: 5.6 g/dL — ABNORMAL LOW (ref 6.5–8.1)

## 2021-06-26 LAB — PHOSPHORUS: Phosphorus: 3.7 mg/dL (ref 2.5–4.6)

## 2021-06-26 MED ORDER — FUROSEMIDE 10 MG/ML IJ SOLN
20.0000 mg | Freq: Once | INTRAMUSCULAR | Status: AC
  Administered 2021-06-26: 20 mg via INTRAVENOUS
  Filled 2021-06-26: qty 2

## 2021-06-26 NOTE — Progress Notes (Signed)
PROGRESS NOTE    Ralph Jackson  EPP:[id number] DOB: 1946/05/10 DOA: 06/19/2021 PCP: Ronnald Collum    Brief Narrative:  75 y.o. male with medical history significant of prior EtOH abuse, in remission for several years now. Pt reportedly was taking out trash, carying boxes and couldn't see steps.  Missed step, fell down about 5 stairs, landed on R hip.  Severe R hip pain after fall, unable to ambulate or bear wt.  Symptoms constant, worse with movement.  In the ED, pt was found to have a R femoral neck fracture. Pt was admitted and orthopedic surgery consulted  Assessment & Plan:   Principal Problem:   Closed displaced fracture of right femoral neck (HCC) Active Problems:   Alcohol abuse, in remission   Alcoholic dementia (HCC)   HTN (hypertension)   Acute respiratory failure with hypoxia (HCC)   R femoral neck fx Orthopedic surgery was consulted, now s/p surgery 10/20 Continue with analgesia as needed Therapy recs were noted for HHPT at the time of d/c Prior EtOH abuse - Sober for several years now Does have mild EtOH dementia after 50 years of heavy drinking Without evidence of withdrawals at this time HTN  Cont metoprolol as tolerated, with dose decreased to 12.5mg  to better allow for diuresis as per below Recent Dehydration Initially clinically dehydrated, received IVF, later developed vol overload with resp failure below Given concerns of acute resp failure, basal fluids now on hold Acute hypoxemic respiratory failure Evidence of pulm edema post-op requiring ICU transfer Improving with lasix Cont to diurese as tolerated, wean O2 as tolerated   DVT prophylaxis: SCD's Code Status: Full Family Communication: Pt in room, recently talked to daughter over phone  Status is: Inpatient  Remains inpatient appropriate because: severity of illness    Consultants:  Orthopedic Surgery PCCM  Procedures:  Right total hip arthroplasty, anterior  approach  Antimicrobials: Anti-infectives (From admission, onward)    Start     Dose/Rate Route Frequency Ordered Stop   06/21/21 2300  ceFAZolin (ANCEF) IVPB 2g/100 mL premix        2 g 200 mL/hr over 30 Minutes Intravenous Every 6 hours 06/21/21 2128 06/22/21 0534   06/21/21 1530  ceFAZolin (ANCEF) IVPB 2g/100 mL premix        2 g 200 mL/hr over 30 Minutes Intravenous On call to O.R. 06/21/21 1153 06/21/21 1700       Subjective: Reports feeling better. Eager to go home soon  Objective: Vitals:   06/26/21 0600 06/26/21 0631 06/26/21 0700 06/26/21 1439  BP:  122/68  109/64  Pulse:  73  69  Resp: 17 17 13 16   Temp:  97.9 F (36.6 C)  99 F (37.2 C)  TempSrc:  Oral  Oral  SpO2:  92%  97%  Weight:      Height:        Intake/Output Summary (Last 24 hours) at 06/26/2021 1501 Last data filed at 06/26/2021 1346 Gross per 24 hour  Intake 120 ml  Output 1300 ml  Net -1180 ml    Filed Weights   06/20/21 0006 06/21/21 1458 06/21/21 2110  Weight: 45.4 kg 45.4 kg 51.3 kg    Examination: General exam: Awake, laying in bed, in nad Respiratory system: Normal respiratory effort, no wheezing Cardiovascular system: regular rate, s1, s2 Gastrointestinal system: Soft, nondistended, positive BS Central nervous system: CN2-12 grossly intact, strength intact Extremities: Perfused, no clubbing Skin: Normal skin turgor, no notable skin lesions seen Psychiatry: Mood  normal // no visual hallucinations   Data Reviewed: I have personally reviewed following labs and imaging studies  CBC: Recent Labs  Lab 06/19/21 2205 06/21/21 0401 06/22/21 0305 06/23/21 0310 06/24/21 0253 06/25/21 0522  WBC 16.8* 13.8* 14.9* 12.6* 11.2* 9.4  NEUTROABS 14.0*  --   --   --   --   --   HGB 12.8* 11.5* 10.7* 9.0* 8.9* 8.9*  HCT 38.9* 35.0* 32.5* 26.8* 26.0* 26.6*  MCV 93.1 93.8 94.2 91.8 90.0 91.7  PLT 266 199 174 176 201 220    Basic Metabolic Panel: Recent Labs  Lab 06/21/21 0401  06/22/21 0305 06/23/21 0310 06/25/21 0522 06/26/21 0630  NA 138 136 137 136 138  K 4.7 3.8 3.9 3.3* 5.1  CL 107 103 103 95* 98  CO2 27 24 29  36* 32  GLUCOSE 109* 145* 105* 93 85  BUN 18 19 22 20  24*  CREATININE 0.63 0.93 0.66 0.67 0.53*  CALCIUM 7.7* 7.5* 7.8* 8.1* 8.5*  MG  --   --  1.7  --   --   PHOS  --   --  <1.0*  --  3.7    GFR: Estimated Creatinine Clearance: 58.8 mL/min (A) (by C-G formula based on SCr of 0.53 mg/dL (L)). Liver Function Tests: Recent Labs  Lab 06/21/21 0401 06/25/21 0522 06/26/21 0630  AST 20 29 29   ALT 14 15 18   ALKPHOS 53 43 44  BILITOT 0.4 0.5 0.4  PROT 5.8* 5.5* 5.6*  ALBUMIN 3.1* 2.4* 2.5*    No results for input(s): LIPASE, AMYLASE in the last 168 hours. No results for input(s): AMMONIA in the last 168 hours. Coagulation Profile: Recent Labs  Lab 06/19/21 2205  INR 0.9    Cardiac Enzymes: No results for input(s): CKTOTAL, CKMB, CKMBINDEX, TROPONINI in the last 168 hours. BNP (last 3 results) No results for input(s): PROBNP in the last 8760 hours. HbA1C: No results for input(s): HGBA1C in the last 72 hours. CBG: Recent Labs  Lab 06/21/21 2147  GLUCAP 112*    Lipid Profile: No results for input(s): CHOL, HDL, LDLCALC, TRIG, CHOLHDL, LDLDIRECT in the last 72 hours. Thyroid Function Tests: No results for input(s): TSH, T4TOTAL, FREET4, T3FREE, THYROIDAB in the last 72 hours.  Anemia Panel: No results for input(s): VITAMINB12, FOLATE, FERRITIN, TIBC, IRON, RETICCTPCT in the last 72 hours. Sepsis Labs: Recent Labs  Lab 06/23/21 0310  LATICACIDVEN 1.1     Recent Results (from the past 240 hour(s))  Resp Panel by RT-PCR (Flu A&B, Covid) Nasopharyngeal Swab     Status: None   Collection Time: 06/19/21 10:20 PM   Specimen: Nasopharyngeal Swab; Nasopharyngeal(NP) swabs in vial transport medium  Result Value Ref Range Status   SARS Coronavirus 2 by RT PCR NEGATIVE NEGATIVE Final    Comment: (NOTE) SARS-CoV-2 target  nucleic acids are NOT DETECTED.  The SARS-CoV-2 RNA is generally detectable in upper respiratory specimens during the acute phase of infection. The lowest concentration of SARS-CoV-2 viral copies this assay can detect is 138 copies/mL. A negative result does not preclude SARS-Cov-2 infection and should not be used as the sole basis for treatment or other patient management decisions. A negative result may occur with  improper specimen collection/handling, submission of specimen other than nasopharyngeal swab, presence of viral mutation(s) within the areas targeted by this assay, and inadequate number of viral copies(<138 copies/mL). A negative result must be combined with clinical observations, patient history, and epidemiological information. The expected result is Negative.  Fact Sheet for Patients:  BloggerCourse.com  Fact Sheet for Healthcare Providers:  SeriousBroker.it  This test is no t yet approved or cleared by the [place] FDA and  has been authorized for detection and/or diagnosis of SARS-CoV-2 by FDA under an Emergency Use Authorization (EUA). This EUA will remain  in effect (meaning this test can be used) for the duration of the COVID-19 declaration under Section 564(b)(1) of the Act, 21 U.S.C.section 360bbb-3(b)(1), unless the authorization is terminated  or revoked sooner.       Influenza A by PCR NEGATIVE NEGATIVE Final   Influenza B by PCR NEGATIVE NEGATIVE Final    Comment: (NOTE) The Xpert Xpress SARS-CoV-2/FLU/RSV plus assay is intended as an aid in the diagnosis of influenza from Nasopharyngeal swab specimens and should not be used as a sole basis for treatment. Nasal washings and aspirates are unacceptable for Xpert Xpress SARS-CoV-2/FLU/RSV testing.  Fact Sheet for Patients: BloggerCourse.com  Fact Sheet for Healthcare  Providers: SeriousBroker.it  This test is not yet approved or cleared by the [place] FDA and has been authorized for detection and/or diagnosis of SARS-CoV-2 by FDA under an Emergency Use Authorization (EUA). This EUA will remain in effect (meaning this test can be used) for the duration of the COVID-19 declaration under Section 564(b)(1) of the Act, 21 U.S.C. section 360bbb-3(b)(1), unless the authorization is terminated or revoked.  Performed at [facility], [address], [place], [place] [address]   Surgical pcr screen     Status: None   Collection Time: 06/21/21  1:05 PM   Specimen: Nasal Mucosa; Nasal Swab  Result Value Ref Range Status   MRSA, PCR NEGATIVE NEGATIVE Final   Staphylococcus aureus NEGATIVE NEGATIVE Final    Comment: (NOTE) The Xpert SA Assay (FDA approved for NASAL specimens in patients 42 years of age and older), is one component of a comprehensive surveillance program. It is not intended to diagnose infection nor to guide or monitor treatment. Performed at [facility], [address], [place], [place] [address]       Radiology Studies: No results found.  Scheduled Meds:  (feeding supplement) PROSource Plus  30 mL Oral Daily   aspirin EC  325 mg Oral Q breakfast   Chlorhexidine Gluconate Cloth  6 each Topical Daily   docusate sodium  100 mg Oral BID   escitalopram  20 mg Oral Daily   feeding supplement  1 Container Oral Q24H   feeding supplement  237 mL Oral BID BM   levothyroxine  50 mcg Oral Daily   loratadine  10 mg Oral Daily   mouth rinse  15 mL Mouth Rinse BID   metoprolol tartrate  12.5 mg Oral BID   multivitamin with minerals  1 tablet Oral Daily   pantoprazole  40 mg Oral Daily   pravastatin  10 mg Oral QHS   senna  1 tablet Oral BID   Continuous Infusions:  sodium chloride Stopped (06/22/21 1430)     LOS: 7 days   Rickey Barbara, MD [facility] Pager On  Amion  If 7PM-7AM, please contact night-coverage 06/26/2021, 3:01 PM

## 2021-06-26 NOTE — Progress Notes (Signed)
Occupational Therapy Treatment Patient Details Name: Ralph Jackson MRN: [id number] DOB: Mar 30, 1946 Today's Date: 06/26/2021   History of present illness Pt is a 75 year old male s/p Right THA direct anterior approach due to fracture from fall prior to admission and had Postop hypoxemia.  PMHx significant for ETOH abuse (several years in remission), HTN   OT comments  Patient was educated on importance of participating in all tasks first prior to asking for therapist to complete. Patient verbalized understanding. Patient was min guard for transfers in and out of recliner with education on proper hand and foot placement for improve transitions. Patient was able to participate in functional activity tolerance challenges for over 5 mins with RW to increase independence in ADLs and to reduce caregiver burden in next level of care. Patient would continue to benefit from skilled OT services at this time while admitted and after d/c to address noted deficits in order to improve overall safety and independence in ADLs.     Recommendations for follow up therapy are one component of a multi-disciplinary discharge planning process, led by the attending physician.  Recommendations may be updated based on patient status, additional functional criteria and insurance authorization.    Follow Up Recommendations  Home health OT    Assistance Recommended at Discharge Frequent or constant Supervision/Assistance  Equipment Recommendations  BSC    Recommendations for Other Services      Precautions / Restrictions Precautions Precautions: Fall Precaution Comments: monitor HR and sats Restrictions Weight Bearing Restrictions: No RLE Weight Bearing: Weight bearing as tolerated       Mobility Bed Mobility Overal bed mobility: Needs Assistance Bed Mobility: Supine to Sit     Supine to sit: Min assist;HOB elevated     General bed mobility comments: off the edge of bed with education to postion  BLE    Transfers Overall transfer level: Needs assistance Equipment used: Rolling walker (2 wheels) Transfers: Sit to/from Stand Sit to Stand: Min assist Stand pivot transfers: Min guard         General transfer comment: patient was able to compelte transfers in and out of recliner with min guard with cues for proper hand and foot positioning.     Balance Overall balance assessment: History of Falls Sitting-balance support: No upper extremity supported;Feet unsupported Sitting balance-Leahy Scale: Good     Standing balance support: Bilateral upper extremity supported;Reliant on assistive device for balance                               ADL either performed or assessed with clinical judgement   ADL Overall ADL's : Needs assistance/impaired         Upper Body Bathing: Sitting;Set up Upper Body Bathing Details (indicate cue type and reason): sitting on edge of bed with encouragement to participate     Upper Body Dressing : Sitting;Set up                   Functional mobility during ADLs: Min guard;Rolling walker (2 wheels)       Vision Patient Visual Report: No change from baseline     Perception     Praxis      Cognition Arousal/Alertness: Awake/alert Behavior During Therapy: WFL for tasks assessed/performed Overall Cognitive Status: Within Functional Limits for tasks assessed  Exercises     Shoulder Instructions       General Comments      Pertinent Vitals/ Pain       Faces Pain Scale: Hurts little more Pain Location: right hip Pain Descriptors / Indicators: Sore;Discomfort Pain Intervention(s): Limited activity within patient's tolerance;Monitored during session;Premedicated before session  Home Living                                          Prior Functioning/Environment              Frequency  Min 2X/week        Progress Toward  Goals  OT Goals(current goals can now be found in the care plan section)  Progress towards OT goals: Progressing toward goals  Acute Rehab OT Goals OT Goal Formulation: With patient Time For Goal Achievement: 07/05/21 Potential to Achieve Goals: Good  Plan      Co-evaluation                 AM-PAC OT "6 Clicks" Daily Activity     Outcome Measure   Help from another person eating meals?: None Help from another person taking care of personal grooming?: A Little Help from another person toileting, which includes using toliet, bedpan, or urinal?: A Little Help from another person bathing (including washing, rinsing, drying)?: A Lot Help from another person to put on and taking off regular upper body clothing?: A Little Help from another person to put on and taking off regular lower body clothing?: A Lot 6 Click Score: 17    End of Session Equipment Utilized During Treatment: Gait belt;Rolling walker (2 wheels)  OT Visit Diagnosis: Unsteadiness on feet (R26.81);Muscle weakness (generalized) (M62.81);History of falling (Z91.81);Pain   Activity Tolerance Patient tolerated treatment well   Patient Left in chair;with call bell/phone within reach;with chair alarm set   Nurse Communication Mobility status        Time: 1610-9604 OT Time Calculation (min): 33 min  Charges: OT General Charges $OT Visit: 1 Visit OT Treatments $Self Care/Home Management : 23-37 mins  Sharyn Blitz OTR/L, MS Acute Rehabilitation Department Office# [phone] Pager# [phone]   Chalmers Guest Willford Rabideau 06/26/2021, 12:52 PM

## 2021-06-27 DIAGNOSIS — S72001A Fracture of unspecified part of neck of right femur, initial encounter for closed fracture: Secondary | ICD-10-CM | POA: Diagnosis not present

## 2021-06-27 LAB — COMPREHENSIVE METABOLIC PANEL
ALT: 23 U/L (ref 0–44)
AST: 31 U/L (ref 15–41)
Albumin: 2.8 g/dL — ABNORMAL LOW (ref 3.5–5.0)
Alkaline Phosphatase: 49 U/L (ref 38–126)
Anion gap: 6 (ref 5–15)
BUN: 21 mg/dL (ref 8–23)
CO2: 32 mmol/L (ref 22–32)
Calcium: 8.5 mg/dL — ABNORMAL LOW (ref 8.9–10.3)
Chloride: 94 mmol/L — ABNORMAL LOW (ref 98–111)
Creatinine, Ser: 0.9 mg/dL (ref 0.61–1.24)
GFR, Estimated: >60 mL/min (ref >60)
Glucose, Bld: 84 mg/dL (ref 70–99)
Potassium: 4.8 mmol/L (ref 3.5–5.1)
Sodium: 132 mmol/L — ABNORMAL LOW (ref 135–145)
Total Bilirubin: 0.4 mg/dL (ref 0.3–1.2)
Total Protein: 6.2 g/dL — ABNORMAL LOW (ref 6.5–8.1)

## 2021-06-27 LAB — CBC
HCT: 28.5 % — ABNORMAL LOW (ref 39.0–52.0)
Hemoglobin: 9.4 g/dL — ABNORMAL LOW (ref 13.0–17.0)
MCH: 30.6 pg (ref 26.0–34.0)
MCHC: 33 g/dL (ref 30.0–36.0)
MCV: 92.8 fL (ref 80.0–100.0)
Platelets: 336 10*3/uL (ref 150–400)
RBC: 3.07 MIL/uL — ABNORMAL LOW (ref 4.22–5.81)
RDW: 14.2 % (ref 11.5–15.5)
WBC: 10.3 10*3/uL (ref 4.0–10.5)
nRBC: 0 % (ref 0.0–0.2)

## 2021-06-27 MED ORDER — FUROSEMIDE 10 MG/ML IJ SOLN
40.0000 mg | Freq: Once | INTRAMUSCULAR | Status: AC
  Administered 2021-06-27: 40 mg via INTRAVENOUS
  Filled 2021-06-27: qty 4

## 2021-06-27 MED ORDER — TRIAMCINOLONE ACETONIDE 0.1 % EX LOTN
TOPICAL_LOTION | Freq: Three times a day (TID) | CUTANEOUS | Status: DC
  Filled 2021-06-27: qty 60

## 2021-06-27 NOTE — Progress Notes (Signed)
PROGRESS NOTE    RAY GERVASI   XTG:[id number]  DOB: March 27, 1946  DOA: 06/19/2021 PCP: [name], PA-C   Brief Narrative:  Ralph Jackson is a 75 year old male with a history of alcohol abuse in remission, hypertension who presented to the ED after a fall and was found to have a right femoral neck fracture.  He was initially treated for dehydration with IV fluids but then developed fluid overload and is subsequently being treated with IV Lasix.   Subjective: He feels that his breathing is improving but he does still get short of breath when he tries to move around.    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 - Patient will get home health physical therapy when discharged  Active Problems: Acute respiratory failure with fluid overload, acute on chronic diastolic heart failure - The patient required a transfer to the ICU due to respiratory distress related to pulmonary edema and has been improving with IV Lasix - 2D echo revealed severe LVH and grade 1 diastolic dysfunction, EF was 60 to 65% Continue to diurese and wean oxygen as able  Hypertension - Continue Lopressor    Alcohol abuse, in remission -Continues to be stable in this regards    Time spent in minutes: 35 DVT prophylaxis: SCDs Start: 06/21/21 2129   Code Status: Full code Family Communication:  Level of Care: Level of care: Telemetry Disposition Plan:  Status is: Inpatient  Remains inpatient appropriate because: Requires ongoing diuresis with IV Lasix   Consultants:  Orthopedic surgery Pulmonary critical care Procedures:  Right total hip arthroplasty, anterior approach Old year history of Antimicrobials:  Anti-infectives (From admission, onward)    Start     Dose/Rate Route Frequency Ordered Stop   06/21/21 2300  ceFAZolin (ANCEF) IVPB 2g/100 mL premix        2 g 200  mL/hr over 30 Minutes Intravenous Every 6 hours 06/21/21 2128 06/22/21 0534   06/21/21 1530  ceFAZolin (ANCEF) IVPB 2g/100 mL premix        2 g 200 mL/hr over 30 Minutes Intravenous On call to O.R. 06/21/21 1153 06/21/21 1700        Objective: Vitals:   06/27/21 0409 06/27/21 0601 06/27/21 0937 06/27/21 1336  BP: 127/67   106/67  Pulse: 67  82 65  Resp: 12  20 16   Temp: (!) 97.4 F (36.3 C)   98.2 F (36.8 C)  TempSrc: Oral   Oral  SpO2: 96%  100% 95%  Weight:  49 kg    Height:        Intake/Output Summary (Last 24 hours) at 06/27/2021 1817 Last data filed at 06/27/2021 1336 Gross per 24 hour  Intake 120 ml  Output 1600 ml  Net -1480 ml   Filed Weights   06/21/21 1458 06/21/21 2110 06/27/21 0601  Weight: 45.4 kg 51.3 kg 49 kg    Examination: General exam: Appears comfortable  HEENT: PERRLA, oral mucosa moist, no sclera icterus or thrush Respiratory system: Bilateral crackles right greater than left Cardiovascular system: S1 & S2 heard, RRR.   Gastrointestinal system: Abdomen soft, non-tender, nondistended. Normal bowel sounds. Central nervous system: Alert and oriented. No focal neurological deficits. Extremities: No cyanosis, clubbing or edema Skin: No rashes or ulcers Psychiatry:  Mood & affect appropriate.     Data Reviewed: I have personally reviewed following labs and imaging studies  CBC:  Recent Labs  Lab 06/22/21 0305 06/23/21 0310 06/24/21 0253 06/25/21 0522 06/27/21 0524  WBC 14.9* 12.6* 11.2* 9.4 10.3  HGB 10.7* 9.0* 8.9* 8.9* 9.4*  HCT 32.5* 26.8* 26.0* 26.6* 28.5*  MCV 94.2 91.8 90.0 91.7 92.8  PLT 174 176 201 220 336   Basic Metabolic Panel: Recent Labs  Lab 06/22/21 0305 06/23/21 0310 06/25/21 0522 06/26/21 0630 06/27/21 0524  NA 136 137 136 138 132*  K 3.8 3.9 3.3* 5.1 4.8  CL 103 103 95* 98 94*  CO2 24 29 36* 32 32  GLUCOSE 145* 105* 93 85 84  BUN 19 22 20  24* 21  CREATININE 0.93 0.66 0.67 0.53* 0.90  CALCIUM 7.5* 7.8*  8.1* 8.5* 8.5*  MG  --  1.7  --   --   --   PHOS  --  <1.0*  --  3.7  --    GFR: Estimated Creatinine Clearance: 49.9 mL/min (by C-G formula based on SCr of 0.9 mg/dL). Liver Function Tests: Recent Labs  Lab 06/21/21 0401 06/25/21 0522 06/26/21 0630 06/27/21 0524  AST 20 29 29 31   ALT 14 15 18 23   ALKPHOS 53 43 44 49  BILITOT 0.4 0.5 0.4 0.4  PROT 5.8* 5.5* 5.6* 6.2*  ALBUMIN 3.1* 2.4* 2.5* 2.8*   No results for input(s): LIPASE, AMYLASE in the last 168 hours. No results for input(s): AMMONIA in the last 168 hours. Coagulation Profile: No results for input(s): INR, PROTIME in the last 168 hours. Cardiac Enzymes: No results for input(s): CKTOTAL, CKMB, CKMBINDEX, TROPONINI in the last 168 hours. BNP (last 3 results) No results for input(s): PROBNP in the last 8760 hours. HbA1C: No results for input(s): HGBA1C in the last 72 hours. CBG: Recent Labs  Lab 06/21/21 2147  GLUCAP 112*   Lipid Profile: No results for input(s): CHOL, HDL, LDLCALC, TRIG, CHOLHDL, LDLDIRECT in the last 72 hours. Thyroid Function Tests: No results for input(s): TSH, T4TOTAL, FREET4, T3FREE, THYROIDAB in the last 72 hours. Anemia Panel: No results for input(s): VITAMINB12, FOLATE, FERRITIN, TIBC, IRON, RETICCTPCT in the last 72 hours. Urine analysis:    Component Value Date/Time   COLORURINE YELLOW 09/13/2019 1956   APPEARANCEUR CLOUDY (A) 09/13/2019 1956   APPEARANCEUR Hazy 01/07/2014 1356   LABSPEC 1.014 09/13/2019 1956   LABSPEC 1.027 01/07/2014 1356   PHURINE 5.0 09/13/2019 1956   GLUCOSEU NEGATIVE 09/13/2019 1956   GLUCOSEU Negative 01/07/2014 1356   HGBUR NEGATIVE 09/13/2019 1956   BILIRUBINUR NEGATIVE 09/13/2019 1956   BILIRUBINUR Negative 01/07/2014 1356   KETONESUR NEGATIVE 09/13/2019 1956   PROTEINUR NEGATIVE 09/13/2019 1956   UROBILINOGEN 1.0 07/08/2014 0143   NITRITE POSITIVE (A) 09/13/2019 1956   LEUKOCYTESUR LARGE (A) 09/13/2019 1956   LEUKOCYTESUR Negative 01/07/2014  1356   Sepsis Labs: @LABRCNTIP (procalcitonin:4,lacticidven:4) ) Recent Results (from the past 240 hour(s))  Resp Panel by RT-PCR (Flu A&B, Covid) Nasopharyngeal Swab     Status: None   Collection Time: 06/19/21 10:20 PM   Specimen: Nasopharyngeal Swab; Nasopharyngeal(NP) swabs in vial transport medium  Result Value Ref Range Status   SARS Coronavirus 2 by RT PCR NEGATIVE NEGATIVE Final    Comment: (NOTE) SARS-CoV-2 target nucleic acids are NOT DETECTED.  The SARS-CoV-2 RNA is generally detectable in upper respiratory specimens during the acute phase of infection. The lowest concentration of SARS-CoV-2 viral copies this assay can detect is 138 copies/mL. A negative result does not preclude SARS-Cov-2 infection and should not be used as the sole basis for treatment  or other patient management decisions. A negative result may occur with  improper specimen collection/handling, submission of specimen other than nasopharyngeal swab, presence of viral mutation(s) within the areas targeted by this assay, and inadequate number of viral copies(<138 copies/mL). A negative result must be combined with clinical observations, patient history, and epidemiological information. The expected result is Negative.  Fact Sheet for Patients:  BloggerCourse.com  Fact Sheet for Healthcare Providers:  SeriousBroker.it  This test is no t yet approved or cleared by the [place] FDA and  has been authorized for detection and/or diagnosis of SARS-CoV-2 by FDA under an Emergency Use Authorization (EUA). This EUA will remain  in effect (meaning this test can be used) for the duration of the COVID-19 declaration under Section 564(b)(1) of the Act, 21 U.S.C.section 360bbb-3(b)(1), unless the authorization is terminated  or revoked sooner.       Influenza A by PCR NEGATIVE NEGATIVE Final   Influenza B by PCR NEGATIVE NEGATIVE Final    Comment:  (NOTE) The Xpert Xpress SARS-CoV-2/FLU/RSV plus assay is intended as an aid in the diagnosis of influenza from Nasopharyngeal swab specimens and should not be used as a sole basis for treatment. Nasal washings and aspirates are unacceptable for Xpert Xpress SARS-CoV-2/FLU/RSV testing.  Fact Sheet for Patients: BloggerCourse.com  Fact Sheet for Healthcare Providers: SeriousBroker.it  This test is not yet approved or cleared by the [place] FDA and has been authorized for detection and/or diagnosis of SARS-CoV-2 by FDA under an Emergency Use Authorization (EUA). This EUA will remain in effect (meaning this test can be used) for the duration of the COVID-19 declaration under Section 564(b)(1) of the Act, 21 U.S.C. section 360bbb-3(b)(1), unless the authorization is terminated or revoked.  Performed at [facility], [address] [place], [place] [address]   Surgical pcr screen     Status: None   Collection Time: 06/21/21  1:05 PM   Specimen: Nasal Mucosa; Nasal Swab  Result Value Ref Range Status   MRSA, PCR NEGATIVE NEGATIVE Final   Staphylococcus aureus NEGATIVE NEGATIVE Final    Comment: (NOTE) The Xpert SA Assay (FDA approved for NASAL specimens in patients 37 years of age and older), is one component of a comprehensive surveillance program. It is not intended to diagnose infection nor to guide or monitor treatment. Performed at [facility], [address], [place], [place] [address]          Radiology Studies: No results found.    Scheduled Meds:  (feeding supplement) PROSource Plus  30 mL Oral Daily   aspirin EC  325 mg Oral Q breakfast   Chlorhexidine Gluconate Cloth  6 each Topical Daily   docusate sodium  100 mg Oral BID   escitalopram  20 mg Oral Daily   feeding supplement  1 Container Oral Q24H   feeding supplement  237 mL Oral BID BM   levothyroxine  50 mcg Oral Daily    loratadine  10 mg Oral Daily   mouth rinse  15 mL Mouth Rinse BID   metoprolol tartrate  12.5 mg Oral BID   multivitamin with minerals  1 tablet Oral Daily   pantoprazole  40 mg Oral Daily   pravastatin  10 mg Oral QHS   senna  1 tablet Oral BID   triamcinolone lotion   Topical TID   Continuous Infusions:  sodium chloride Stopped (06/22/21 1430)     LOS: 8 days      Demontez Novack,  MD [facility] Pager: [email or web address] 06/27/2021, 6:17 PM

## 2021-06-27 NOTE — Progress Notes (Signed)
Occupational Therapy Treatment Patient Details Name: Ralph Jackson MRN: [id number] DOB: 1945/12/09 Today's Date: 06/27/2021   History of present illness Pt is a 75 year old male s/p Right THA direct anterior approach due to fracture from fall prior to admission and had Postop hypoxemia.  PMHx significant for ETOH abuse (several years in remission), HTN   OT comments  Patient was educated on LB dressing tasks with AE. Patient needed sequencing cues for tasks for each leg after multimodal demonstration provided. Patient was min A for LB dressing with AE and consistent cues for sequencing of AE. Patient tolerated session well. Patient's discharge plan remains appropriate at this time. OT will continue to follow acutely.     Recommendations for follow up therapy are one component of a multi-disciplinary discharge planning process, led by the attending physician.  Recommendations may be updated based on patient status, additional functional criteria and insurance authorization.    Follow Up Recommendations  Home health OT    Assistance Recommended at Discharge Frequent or constant Supervision/Assistance  Equipment Recommendations  BSC;Other (comment) (total hip kit)    Recommendations for Other Services      Precautions / Restrictions Precautions Precautions: Fall Precaution Booklet Issued: No Precaution Comments: monitor HR and sats Restrictions Weight Bearing Restrictions: No RLE Weight Bearing: Weight bearing as tolerated       Mobility Bed Mobility Overal bed mobility: Needs Assistance Bed Mobility: Supine to Sit     Supine to sit: HOB elevated;Supervision     General bed mobility comments: patient was able to sit up on edge of bed with supervision and increased time    Transfers                         Balance   Sitting-balance support: No upper extremity supported;Feet unsupported Sitting balance-Leahy Scale: Good Sitting balance - Comments:  participated in LB dressing education and demonstration on edge of bed                                   ADL either performed or assessed with clinical judgement   ADL Overall ADL's : Needs assistance/impaired                       Lower Body Dressing Details (indicate cue type and reason): patinet was educated on LB dressing tasks sitting on edge of bed with consistent sequencing cues for AE use. patient was unable to remember steps between BLE. patient expressed desire to continue to practice.                     Vision Patient Visual Report: No change from baseline     Perception     Praxis      Cognition Arousal/Alertness: Awake/alert Behavior During Therapy: WFL for tasks assessed/performed Overall Cognitive Status: Within Functional Limits for tasks assessed                                            Exercises     Shoulder Instructions       General Comments      Pertinent Vitals/ Pain       Faces Pain Scale: Hurts little more Pain Location: right hip Pain Descriptors / Indicators: Sore;Discomfort  Pain Intervention(s): Monitored during session;Limited activity within patient's tolerance;Repositioned  Home Living                                          Prior Functioning/Environment              Frequency  Min 2X/week        Progress Toward Goals  OT Goals(current goals can now be found in the care plan section)  Progress towards OT goals: Progressing toward goals  Acute Rehab OT Goals OT Goal Formulation: With patient Time For Goal Achievement: 07/05/21 Potential to Achieve Goals: Good  Plan Discharge plan remains appropriate    Co-evaluation                 AM-PAC OT "6 Clicks" Daily Activity     Outcome Measure   Help from another person eating meals?: None Help from another person taking care of personal grooming?: A Little Help from another person toileting,  which includes using toliet, bedpan, or urinal?: A Little Help from another person bathing (including washing, rinsing, drying)?: A Lot Help from another person to put on and taking off regular upper body clothing?: A Little Help from another person to put on and taking off regular lower body clothing?: A Lot 6 Click Score: 17    End of Session Equipment Utilized During Treatment: Other (comment) (total hip kit)  OT Visit Diagnosis: Unsteadiness on feet (R26.81);Muscle weakness (generalized) (M62.81);History of falling (Z91.81);Pain   Activity Tolerance Patient tolerated treatment well   Patient Left in bed;with bed alarm set;with call bell/phone within reach   Nurse Communication Mobility status        Time: 6484-7207 OT Time Calculation (min): 17 min  Charges: OT General Charges $OT Visit: 1 Visit OT Treatments $Self Care/Home Management : 8-22 mins  Jackelyn Poling OTR/L, MS Acute Rehabilitation Department Office# [phone] Pager# [phone]   [place] 06/27/2021, 3:50 PM

## 2021-06-27 NOTE — Progress Notes (Signed)
Physical Therapy Treatment Patient Details Name: Ralph Jackson MRN: [id number] DOB: [birth year] Today's Date: 06/27/2021   History of Present Illness Pt is a 75 year old male s/p Right THA direct anterior approach due to fracture from fall prior to admission and had Postop hypoxemia.  PMHx significant for ETOH abuse (several years in remission), HTN    PT Comments    Patient in bed ans sheets soiled with urine, pt noted to have red bumps on upper back and sacral patch soiled. RN notified and pt's skin cleansed and new sacral protective patch placed. Pt initially limited by hip pain at start of session with sit<>stand but hip discomfort improved on second stand. Gait distance increased to ~180' this session and pt required min guard with intermittent assist to manage walker position during turns. EOS pt instructed on LE strengthening in standing for Rt hip strength. He is making good progress with mobility; acute PT will continue to progress pt as able during stay for safe discharge home.     Recommendations for follow up therapy are one component of a multi-disciplinary discharge planning process, led by the attending physician.  Recommendations may be updated based on patient status, additional functional criteria and insurance authorization.  Follow Up Recommendations  Home health PT     Assistance Recommended at Discharge Intermittent Supervision/Assistance  Equipment Recommendations  None recommended by PT    Recommendations for Other Services       Precautions / Restrictions Precautions Precautions: Fall Precaution Booklet Issued: No Precaution Comments: monitor HR and sats Restrictions Weight Bearing Restrictions: No RLE Weight Bearing: Weight bearing as tolerated     Mobility  Bed Mobility Overal bed mobility: Needs Assistance Bed Mobility: Supine to Sit     Supine to sit: HOB elevated;Min guard     General bed mobility comments: guarding to pivot to EOB. pt  taking extra time but abel to raise trunk and move LE's off edge without assit.    Transfers Overall transfer level: Needs assistance Equipment used: Rolling walker (2 wheels) Transfers: Sit to/from Stand Sit to Stand: Min assist Stand pivot transfers: Min guard         General transfer comment: cues for safe hand placement for power up and cues for Rt foot positioning    Ambulation/Gait Ambulation/Gait assistance: Min guard;Min assist Gait Distance (Feet): 180 Feet Assistive device: Rolling walker (2 wheels) Gait Pattern/deviations: Step-to pattern;Decreased stance time - right;Antalgic;Trunk flexed;Decreased stride length;Knee flexed in stance - right Gait velocity: decr   General Gait Details: mod cues for safe proximity/positioning of RW. pt tends to push walker too far forward. pt on RA and saturating at 90-95% on RA with gait. HR in 110's.   Stairs             Wheelchair Mobility    Modified Rankin (Stroke Patients Only)       Balance Overall balance assessment: History of Falls Sitting-balance support: No upper extremity supported;Feet unsupported Sitting balance-Leahy Scale: Good     Standing balance support: Bilateral upper extremity supported;Reliant on assistive device for balance Standing balance-Leahy Scale: Poor                              Cognition Arousal/Alertness: Awake/alert Behavior During Therapy: WFL for tasks assessed/performed Overall Cognitive Status: Within Functional Limits for tasks assessed  Exercises Other Exercises Other Exercises: 10x marching bil LE standing with RW support Other Exercises: 10x hip abduction on Rt LE in standing with RW support Other Exercises: 10x bil heel raises in standing with RW support.    General Comments        Pertinent Vitals/Pain Pain Assessment: Faces Faces Pain Scale: Hurts little more Pain Location: right hip Pain  Descriptors / Indicators: Sore;Discomfort Pain Intervention(s): Limited activity within patient's tolerance;Monitored during session;Repositioned    Home Living                          Prior Function            PT Goals (current goals can now be found in the care plan section) Acute Rehab PT Goals Patient Stated Goal: To go home PT Goal Formulation: With patient Time For Goal Achievement: 07/06/21 Potential to Achieve Goals: Good Progress towards PT goals: Progressing toward goals    Frequency    Min 3X/week      PT Plan Current plan remains appropriate    Co-evaluation              AM-PAC PT "6 Clicks" Mobility   Outcome Measure  Help needed turning from your back to your side while in a flat bed without using bedrails?: A Little Help needed moving from lying on your back to sitting on the side of a flat bed without using bedrails?: A Little Help needed moving to and from a bed to a chair (including a wheelchair)?: A Little Help needed standing up from a chair using your arms (e.g., wheelchair or bedside chair)?: A Little Help needed to walk in hospital room?: A Little Help needed climbing 3-5 steps with a railing? : A Lot 6 Click Score: 17    End of Session Equipment Utilized During Treatment: Gait belt;Oxygen Activity Tolerance: Patient tolerated treatment well Patient left: in chair;with call bell/phone within reach;with chair alarm set Nurse Communication: Mobility status PT Visit Diagnosis: Difficulty in walking, not elsewhere classified (R26.2)     Time: 7858-8502 PT Time Calculation (min) (ACUTE ONLY): 40 min  Charges:  $Gait Training: 8-22 mins $Therapeutic Exercise: 8-22 mins $Therapeutic Activity: 8-22 mins                     Ralph Jackson, DPT Acute Rehabilitation Services Office [phone] Pager [phone]    Ralph Jackson 06/27/2021, 12:50 PM

## 2021-06-28 DIAGNOSIS — S72001A Fracture of unspecified part of neck of right femur, initial encounter for closed fracture: Secondary | ICD-10-CM | POA: Diagnosis not present

## 2021-06-28 DIAGNOSIS — J811 Chronic pulmonary edema: Secondary | ICD-10-CM

## 2021-06-28 LAB — BASIC METABOLIC PANEL
Anion gap: 8 (ref 5–15)
BUN: 29 mg/dL — ABNORMAL HIGH (ref 8–23)
CO2: 32 mmol/L (ref 22–32)
Calcium: 9.2 mg/dL (ref 8.9–10.3)
Chloride: 95 mmol/L — ABNORMAL LOW (ref 98–111)
Creatinine, Ser: 0.87 mg/dL (ref 0.61–1.24)
GFR, Estimated: >60 mL/min (ref >60)
Glucose, Bld: 96 mg/dL (ref 70–99)
Potassium: 4.7 mmol/L (ref 3.5–5.1)
Sodium: 135 mmol/L (ref 135–145)

## 2021-06-28 MED ORDER — PROSOURCE PLUS PO LIQD: 30.0000 mL | Freq: Two times a day (BID) | ORAL | 10 refills | Status: DC

## 2021-06-28 MED ORDER — ENSURE ENLIVE PO LIQD: 237.0000 mL | Freq: Two times a day (BID) | ORAL | 12 refills | Status: DC

## 2021-06-28 MED ORDER — POLYETHYLENE GLYCOL 3350 17 G PO PACK: 17.0000 g | PACK | Freq: Every day | ORAL | 0 refills | Status: DC | PRN

## 2021-06-28 MED ORDER — SENNA 8.6 MG PO TABS: 1.0000 | ORAL_TABLET | Freq: Two times a day (BID) | ORAL | 0 refills | Status: DC

## 2021-06-28 NOTE — Discharge Summary (Signed)
Physician Discharge Summary  Ralph Jackson YQM:[id number] DOB: 01-30-1946 DOA: 06/19/2021  PCP: [name], PA-C  Admit date: 06/19/2021 Discharge date: 06/28/2021  Admitted From: home  Disposition:  home   Recommendations for Outpatient Follow-up:  Continue to encourage abstinence from alcohol F/u on nutrition and hypoalbuminemia  Home Health:  ordered  Discharge Condition:  stable   CODE STATUS:  Full code   Diet recommendation:  heart healthy   Consultants:  Orthopedic surgery Pulmonary critical care Procedures:  Right total hip arthroplasty, anterior approach Old year history of   Discharge Diagnoses:  Principal Problem:   Closed displaced fracture of right femoral neck (HCC) Active Problems: Acute respiratory failure with hypoxia (HCC)   Pulmonary edema   Protein-calorie malnutrition, severe (HCC)   Alcohol abuse, in remission     HTN (hypertension)      Brief Summary: Ralph Jackson is a 75 year old male with a history of alcohol abuse in remission, hypertension who presented to the ED after a fall and was found to have a right femoral neck fracture.  He was initially treated for dehydration with IV fluids but then developed fluid overload and is subsequently being treated with IV Lasix.    Hospital Course:  Principal Problem:   Closed displaced fracture of right femoral neck (HCC) -Status post right total hip arthroplasty on 10/20 - Patient will get home health physical therapy when discharged   Active Problems: Acute respiratory failure with fluid overload, acute on chronic diastolic heart failure - The patient required a transfer to the ICU due to respiratory distress related to pulmonary edema and has improved with IV Lasix - 2D echo revealed severe LVH and grade 1 diastolic dysfunction, EF was 60 to 65% Continue to diurese and wean oxygen as able   Hypertension - Continue Lopressor     Alcohol abuse, in remission -Continues to be  stable in this regards  Normocytic anemia - stable  Hypoalbuminemia - Albumin level has been 2- 2.8- follow     Discharge Exam: Vitals:   06/28/21 0543 06/28/21 1356  BP: 96/71 105/89  Pulse: 79 71  Resp: 18 18  Temp:  98.8 F (37.1 C)  SpO2: 98% 100%   Vitals:   06/27/21 2112 06/28/21 0500 06/28/21 0543 06/28/21 1356  BP: 133/68  96/71 105/89  Pulse: 74  79 71  Resp: 20  18 18   Temp: 99.6 F (37.6 C)   98.8 F (37.1 C)  TempSrc: Oral   Oral  SpO2: 100%  98% 100%  Weight:  46.6 kg    Height:        General: Pt is alert, awake, not in acute distress Cardiovascular: RRR, S1/S2 +, no rubs, no gallops Respiratory: CTA bilaterally, no wheezing, no rhonchi Abdominal: Soft, NT, ND, bowel sounds + Extremities: no edema, no cyanosis   Discharge Instructions  Discharge Instructions     Diet - low sodium heart healthy   Complete by: As directed    Increase activity slowly   Complete by: As directed    No wound care   Complete by: As directed       Allergies as of 06/28/2021   No Known Allergies      Medication List     TAKE these medications    aspirin 81 MG chewable tablet Commonly known as: Aspirin Childrens Chew 1 tablet (81 mg total) by mouth 2 (two) times daily.   escitalopram 20 MG tablet Commonly known as: LEXAPRO Take 20 mg  by mouth daily.   feeding supplement Liqd Take 237 mLs by mouth 2 (two) times daily between meals.   (feeding supplement) PROSource Plus liquid Take 30 mLs by mouth 2 (two) times daily between meals.   HYDROcodone-acetaminophen 5-325 MG tablet Commonly known as: NORCO/VICODIN Take 1-2 tablets by mouth every 4 (four) hours as needed for moderate pain.   levothyroxine 50 MCG tablet Commonly known as: SYNTHROID Take 50 mcg by mouth daily.   loratadine 10 MG tablet Commonly known as: CLARITIN Take 1 tablet (10 mg total) by mouth daily.   metoprolol tartrate 25 MG tablet Commonly known as: LOPRESSOR Take 25 mg by  mouth 2 (two) times daily.   omeprazole 20 MG capsule Commonly known as: PRILOSEC Take 20 mg by mouth daily.   One-A-Day Mens 50+ Tabs Take 1 tablet by mouth daily.   polyethylene glycol 17 g packet Commonly known as: MIRALAX / GLYCOLAX Take 17 g by mouth daily as needed for mild constipation.   pravastatin 10 MG tablet Commonly known as: PRAVACHOL Take 10 mg by mouth at bedtime.   Prolia 60 MG/ML Sosy injection Generic drug: denosumab Inject 60 mg into the skin See admin instructions. Every 6 months   senna 8.6 MG Tabs tablet Commonly known as: SENOKOT Take 1 tablet (8.6 mg total) by mouth 2 (two) times daily.        Follow-up Information     Swinteck, Arlys John, MD. Schedule an appointment as soon as possible for a visit in 2 week(s).   Specialty: Orthopedic Surgery Why: For wound re-check, For suture removal Contact information: [address] [place] [address] [phone]                No Known Allergies    Pelvis Portable  Result Date: 06/21/2021 CLINICAL DATA:  Postop EXAM: PORTABLE PELVIS 1-2 VIEWS COMPARISON:  06/19/2021 FINDINGS: Interval right hip replacement with intact hardware and normal alignment. Gas in the soft tissues consistent with recent surgery. Vascular calcifications IMPRESSION: Right hip replacement with expected postsurgical change Electronically Signed   By: Jasmine Pang M.D.   On: 06/21/2021 19:37   DG Chest Port 1 View  Result Date: 06/21/2021 CLINICAL DATA:  Status post right hip replacement and respiratory compromise, initial encounter EXAM: PORTABLE CHEST 1 VIEW COMPARISON:  06/19/2021 FINDINGS: Cardiac shadow is stable. Aortic calcifications are noted. Increased vascular congestion is noted with mild interstitial edema. Mild basilar atelectasis is noted as well. No bony abnormality is seen. IMPRESSION: Changes consistent with mild CHF. Electronically Signed   By: Alcide Clever M.D.   On: 06/21/2021 21:14   DG  Chest Port 1 View  Result Date: 06/19/2021 CLINICAL DATA:  Fall EXAM: PORTABLE CHEST 1 VIEW COMPARISON:  09/14/2019 FINDINGS: Lungs are clear.  No pleural effusion or pneumothorax. The heart is normal in size. Moderate hiatal hernia. Old left rib fracture deformities. IMPRESSION: No evidence of acute cardiopulmonary disease. Electronically Signed   By: Charline Bills M.D.   On: 06/19/2021 22:06   DG Knee Right Port  Result Date: 06/20/2021 CLINICAL DATA:  75 year old male with right lower extremity pain, right femoral neck fracture. EXAM: PORTABLE RIGHT KNEE - 1-2 VIEW COMPARISON:  Right hip series 06/19/2021.  Right tib fib 02/15/2014. FINDINGS: Chronic calcified peripheral vascular disease. No definite joint effusion on the cross-table lateral view. Knee joint alignment and joint spaces appear stable since 2015. No acute osseous abnormality identified. IMPRESSION: No acute osseous abnormality identified about the right knee. Calcified  peripheral vascular disease. Electronically Signed   By: Odessa Fleming M.D.   On: 06/20/2021 09:26   DG C-Arm 1-60 Min-No Report  Result Date: 06/21/2021 Fluoroscopy was utilized by the requesting physician.  No radiographic interpretation.   DG C-Arm 1-60 Min-No Report  Result Date: 06/21/2021 Fluoroscopy was utilized by the requesting physician.  No radiographic interpretation.   ECHOCARDIOGRAM COMPLETE  Result Date: 06/22/2021    ECHOCARDIOGRAM REPORT   Patient Name:   Ralph Jackson Date of Exam: 06/22/2021 Medical Rec #:  [id number]           Height:       61.0 in Accession #:    [id number]          Weight:       113.1 lb Date of Birth:  Mar 02, 1946          BSA:          1.482 m Patient Age:    74 years            BP:           112/65 mmHg Patient Gender: M                   HR:           86 bpm. Exam Location:  Inpatient Procedure: 2D Echo, Cardiac Doppler and Color Doppler Indications:    CHF-Acute Systolic I50.21  History:        Patient has no prior  history of Echocardiogram examinations.                 Risk Factors:ETOH and Hypertension.  Sonographer:    Eulah Pont RDCS Referring Phys: [id number] STEPHEN K CHIU IMPRESSIONS  1. Severe proximal septal thickening with chordal SAM; elevated LVOT gradient at rest (2.3 m/s).  2. Left ventricular ejection fraction, by estimation, is 60 to 65%. The left ventricle has normal function. The left ventricle has no regional wall motion abnormalities. There is severe left ventricular hypertrophy of the basal-septal segment. Left ventricular diastolic parameters are consistent with Grade I diastolic dysfunction (impaired relaxation).  3. Right ventricular systolic function is normal. The right ventricular size is normal. There is mildly elevated pulmonary artery systolic pressure.  4. Left atrial size was mildly dilated.  5. The mitral valve is normal in structure. No evidence of mitral valve regurgitation. No evidence of mitral stenosis.  6. The aortic valve is tricuspid. Aortic valve regurgitation is trivial. No aortic stenosis is present.  7. The inferior vena cava is normal in size with greater than 50% respiratory variability, suggesting right atrial pressure of 3 mmHg. Comparison(s): No prior Echocardiogram. FINDINGS  Left Ventricle: Left ventricular ejection fraction, by estimation, is 60 to 65%. The left ventricle has normal function. The left ventricle has no regional wall motion abnormalities. The left ventricular internal cavity size was normal in size. There is  severe left ventricular hypertrophy of the basal-septal segment. Left ventricular diastolic parameters are consistent with Grade I diastolic dysfunction (impaired relaxation). Right Ventricle: The right ventricular size is normal. Right ventricular systolic function is normal. There is mildly elevated pulmonary artery systolic pressure. The tricuspid regurgitant velocity is 3.19 m/s, and with an assumed right atrial pressure of 3 mmHg, the estimated right  ventricular systolic pressure is 43.7 mmHg. Left Atrium: Left atrial size was mildly dilated. Right Atrium: Right atrial size was normal in size. Pericardium: There is no evidence of pericardial effusion. Mitral Valve: The mitral valve  is normal in structure. No evidence of mitral valve regurgitation. No evidence of mitral valve stenosis. Tricuspid Valve: The tricuspid valve is normal in structure. Tricuspid valve regurgitation is mild . No evidence of tricuspid stenosis. Aortic Valve: The aortic valve is tricuspid. Aortic valve regurgitation is trivial. No aortic stenosis is present. Pulmonic Valve: The pulmonic valve was normal in structure. Pulmonic valve regurgitation is trivial. No evidence of pulmonic stenosis. Aorta: The aortic root is normal in size and structure. Venous: The inferior vena cava is normal in size with greater than 50% respiratory variability, suggesting right atrial pressure of 3 mmHg. IAS/Shunts: No atrial level shunt detected by color flow Doppler. Additional Comments: Severe proximal septal thickening with chordal SAM; elevated LVOT gradient at rest (2.3 m/s).  LEFT VENTRICLE PLAX 2D LVIDd:         3.30 cm   Diastology LVIDs:         2.00 cm   LV e' medial:    6.75 cm/s LV PW:         0.90 cm   LV E/e' medial:  9.5 LV IVS:        1.30 cm   LV e' lateral:   8.06 cm/s LVOT diam:     1.80 cm   LV E/e' lateral: 8.0 LV SV:         75 LV SV Index:   51 LVOT Area:     2.54 cm  RIGHT VENTRICLE RV S prime:     11.70 cm/s TAPSE (M-mode): 1.9 cm LEFT ATRIUM             Index        RIGHT ATRIUM           Index LA diam:        2.80 cm 1.89 cm/m   RA Area:     12.80 cm LA Vol (A2C):   41.3 ml 27.86 ml/m  RA Volume:   31.20 ml  21.05 ml/m LA Vol (A4C):   46.7 ml 31.50 ml/m LA Biplane Vol: 43.8 ml 29.55 ml/m  AORTIC VALVE LVOT Vmax:   174.00 cm/s LVOT Vmean:  113.000 cm/s LVOT VTI:    0.295 m  AORTA Ao Root diam: 3.60 cm Ao Asc diam:  3.70 cm MITRAL VALVE               TRICUSPID VALVE MV Area  (PHT): 2.86 cm    TR Peak grad:   40.7 mmHg MV Decel Time: 265 msec    TR Vmax:        319.00 cm/s MV E velocity: 64.10 cm/s MV A velocity: 62.80 cm/s  SHUNTS MV E/A ratio:  1.02        Systemic VTI:  0.30 m                            Systemic Diam: 1.80 cm Olga Millers MD Electronically signed by Olga Millers MD Signature Date/Time: 06/22/2021/4:15:17 PM    Final    DG HIP UNILAT WITH PELVIS 2-3 VIEWS RIGHT  Result Date: 06/21/2021 CLINICAL DATA:  Right hip arthroplasty EXAM: DG HIP (WITH OR WITHOUT PELVIS) 2-3V RIGHT COMPARISON:  06/19/2021 FINDINGS: Five fluoroscopic images are obtained during the performance of the procedure and are provided for interpretation only. A subcapital right femoral neck fracture is again identified. Subsequent placement of a right hip arthroplasty in the expected position without signs of acute complication. Please refer to  the operative report. FLUOROSCOPY TIME:  20 seconds IMPRESSION: 1. Right hip arthroplasty as above. Please refer to the operative report. Electronically Signed   By: Sharlet Salina M.D.   On: 06/21/2021 18:58   DG Hip Unilat W or Wo Pelvis 2-3 Views Right  Result Date: 06/19/2021 CLINICAL DATA:  Fall, pain. EXAM: DG HIP (WITH OR WITHOUT PELVIS) 2-3V RIGHT COMPARISON:  None. FINDINGS: The bones are osteopenic. There is a subcapital right femoral neck fracture, mildly impacted. There is no dislocation. Joint spaces are maintained. Prominent vascular calcifications are seen in the soft tissues. IMPRESSION: 1. Subcapital right femoral neck fracture. Electronically Signed   By: Darliss Cheney M.D.   On: 06/19/2021 18:50     The results of significant diagnostics from this hospitalization (including imaging, microbiology, ancillary and laboratory) are listed below for reference.     Microbiology: Recent Results (from the past 240 hour(s))  Resp Panel by RT-PCR (Flu A&B, Covid) Nasopharyngeal Swab     Status: None   Collection Time: 06/19/21 10:20 PM    Specimen: Nasopharyngeal Swab; Nasopharyngeal(NP) swabs in vial transport medium  Result Value Ref Range Status   SARS Coronavirus 2 by RT PCR NEGATIVE NEGATIVE Final    Comment: (NOTE) SARS-CoV-2 target nucleic acids are NOT DETECTED.  The SARS-CoV-2 RNA is generally detectable in upper respiratory specimens during the acute phase of infection. The lowest concentration of SARS-CoV-2 viral copies this assay can detect is 138 copies/mL. A negative result does not preclude SARS-Cov-2 infection and should not be used as the sole basis for treatment or other patient management decisions. A negative result may occur with  improper specimen collection/handling, submission of specimen other than nasopharyngeal swab, presence of viral mutation(s) within the areas targeted by this assay, and inadequate number of viral copies(<138 copies/mL). A negative result must be combined with clinical observations, patient history, and epidemiological information. The expected result is Negative.  Fact Sheet for Patients:  BloggerCourse.com  Fact Sheet for Healthcare Providers:  SeriousBroker.it  This test is no t yet approved or cleared by the [place] FDA and  has been authorized for detection and/or diagnosis of SARS-CoV-2 by FDA under an Emergency Use Authorization (EUA). This EUA will remain  in effect (meaning this test can be used) for the duration of the COVID-19 declaration under Section 564(b)(1) of the Act, 21 U.S.C.section 360bbb-3(b)(1), unless the authorization is terminated  or revoked sooner.       Influenza A by PCR NEGATIVE NEGATIVE Final   Influenza B by PCR NEGATIVE NEGATIVE Final    Comment: (NOTE) The Xpert Xpress SARS-CoV-2/FLU/RSV plus assay is intended as an aid in the diagnosis of influenza from Nasopharyngeal swab specimens and should not be used as a sole basis for treatment. Nasal washings and aspirates are  unacceptable for Xpert Xpress SARS-CoV-2/FLU/RSV testing.  Fact Sheet for Patients: BloggerCourse.com  Fact Sheet for Healthcare Providers: SeriousBroker.it  This test is not yet approved or cleared by the [place] FDA and has been authorized for detection and/or diagnosis of SARS-CoV-2 by FDA under an Emergency Use Authorization (EUA). This EUA will remain in effect (meaning this test can be used) for the duration of the COVID-19 declaration under Section 564(b)(1) of the Act, 21 U.S.C. section 360bbb-3(b)(1), unless the authorization is terminated or revoked.  Performed at [facility], [address] [place], [place] [address]   Surgical pcr screen     Status: None   Collection Time: 06/21/21  1:05 PM  Specimen: Nasal Mucosa; Nasal Swab  Result Value Ref Range Status   MRSA, PCR NEGATIVE NEGATIVE Final   Staphylococcus aureus NEGATIVE NEGATIVE Final    Comment: (NOTE) The Xpert SA Assay (FDA approved for NASAL specimens in patients 34 years of age and older), is one component of a comprehensive surveillance program. It is not intended to diagnose infection nor to guide or monitor treatment. Performed at [facility], [address] [place], [place] [address]      Labs: BNP (last 3 results) Recent Labs    06/21/21 2318  BNP 791.1*   Basic Metabolic Panel: Recent Labs  Lab 06/23/21 0310 06/25/21 0522 06/26/21 0630 06/27/21 0524 06/28/21 0527  NA 137 136 138 132* 135  K 3.9 3.3* 5.1 4.8 4.7  CL 103 95* 98 94* 95*  CO2 29 36* 32 32 32  GLUCOSE 105* 93 85 84 96  BUN 22 20 24* 21 29*  CREATININE 0.66 0.67 0.53* 0.90 0.87  CALCIUM 7.8* 8.1* 8.5* 8.5* 9.2  MG 1.7  --   --   --   --   PHOS <1.0*  --  3.7  --   --    Liver Function Tests: Recent Labs  Lab 06/25/21 0522 06/26/21 0630 06/27/21 0524  AST 29 29 31   ALT 15 18 23   ALKPHOS 43 44 49  BILITOT 0.5 0.4 0.4  PROT  5.5* 5.6* 6.2*  ALBUMIN 2.4* 2.5* 2.8*   No results for input(s): LIPASE, AMYLASE in the last 168 hours. No results for input(s): AMMONIA in the last 168 hours. CBC: Recent Labs  Lab 06/22/21 0305 06/23/21 0310 06/24/21 0253 06/25/21 0522 06/27/21 0524  WBC 14.9* 12.6* 11.2* 9.4 10.3  HGB 10.7* 9.0* 8.9* 8.9* 9.4*  HCT 32.5* 26.8* 26.0* 26.6* 28.5*  MCV 94.2 91.8 90.0 91.7 92.8  PLT 174 176 201 220 336   Cardiac Enzymes: No results for input(s): CKTOTAL, CKMB, CKMBINDEX, TROPONINI in the last 168 hours. BNP: Invalid input(s): POCBNP CBG: Recent Labs  Lab 06/21/21 2147  GLUCAP 112*   D-Dimer No results for input(s): DDIMER in the last 72 hours. Hgb A1c No results for input(s): HGBA1C in the last 72 hours. Lipid Profile No results for input(s): CHOL, HDL, LDLCALC, TRIG, CHOLHDL, LDLDIRECT in the last 72 hours. Thyroid function studies No results for input(s): TSH, T4TOTAL, T3FREE, THYROIDAB in the last 72 hours.  Invalid input(s): FREET3 Anemia work up No results for input(s): VITAMINB12, FOLATE, FERRITIN, TIBC, IRON, RETICCTPCT in the last 72 hours. Urinalysis    Component Value Date/Time   COLORURINE YELLOW 09/13/2019 1956   APPEARANCEUR CLOUDY (A) 09/13/2019 1956   APPEARANCEUR Hazy 01/07/2014 1356   LABSPEC 1.014 09/13/2019 1956   LABSPEC 1.027 01/07/2014 1356   PHURINE 5.0 09/13/2019 1956   GLUCOSEU NEGATIVE 09/13/2019 1956   GLUCOSEU Negative 01/07/2014 1356   HGBUR NEGATIVE 09/13/2019 1956   BILIRUBINUR NEGATIVE 09/13/2019 1956   BILIRUBINUR Negative 01/07/2014 1356   KETONESUR NEGATIVE 09/13/2019 1956   PROTEINUR NEGATIVE 09/13/2019 1956   UROBILINOGEN 1.0 07/08/2014 0143   NITRITE POSITIVE (A) 09/13/2019 1956   LEUKOCYTESUR LARGE (A) 09/13/2019 1956   LEUKOCYTESUR Negative 01/07/2014 1356   Sepsis Labs Invalid input(s): PROCALCITONIN,  WBC,  LACTICIDVEN Microbiology Recent Results (from the past 240 hour(s))  Resp Panel by RT-PCR (Flu A&B,  Covid) Nasopharyngeal Swab     Status: None   Collection Time: 06/19/21 10:20 PM   Specimen: Nasopharyngeal Swab; Nasopharyngeal(NP) swabs in vial transport medium  Result Value Ref Range Status   SARS Coronavirus 2 by RT PCR NEGATIVE NEGATIVE Final    Comment: (NOTE) SARS-CoV-2 target nucleic acids are NOT DETECTED.  The SARS-CoV-2 RNA is generally detectable in upper respiratory specimens during the acute phase of infection. The lowest concentration of SARS-CoV-2 viral copies this assay can detect is 138 copies/mL. A negative result does not preclude SARS-Cov-2 infection and should not be used as the sole basis for treatment or other patient management decisions. A negative result may occur with  improper specimen collection/handling, submission of specimen other than nasopharyngeal swab, presence of viral mutation(s) within the areas targeted by this assay, and inadequate number of viral copies(<138 copies/mL). A negative result must be combined with clinical observations, patient history, and epidemiological information. The expected result is Negative.  Fact Sheet for Patients:  BloggerCourse.com  Fact Sheet for Healthcare Providers:  SeriousBroker.it  This test is no t yet approved or cleared by the [place] FDA and  has been authorized for detection and/or diagnosis of SARS-CoV-2 by FDA under an Emergency Use Authorization (EUA). This EUA will remain  in effect (meaning this test can be used) for the duration of the COVID-19 declaration under Section 564(b)(1) of the Act, 21 U.S.C.section 360bbb-3(b)(1), unless the authorization is terminated  or revoked sooner.       Influenza A by PCR NEGATIVE NEGATIVE Final   Influenza B by PCR NEGATIVE NEGATIVE Final    Comment: (NOTE) The Xpert Xpress SARS-CoV-2/FLU/RSV plus assay is intended as an aid in the diagnosis of influenza from Nasopharyngeal swab specimens and should  not be used as a sole basis for treatment. Nasal washings and aspirates are unacceptable for Xpert Xpress SARS-CoV-2/FLU/RSV testing.  Fact Sheet for Patients: BloggerCourse.com  Fact Sheet for Healthcare Providers: SeriousBroker.it  This test is not yet approved or cleared by the [place] FDA and has been authorized for detection and/or diagnosis of SARS-CoV-2 by FDA under an Emergency Use Authorization (EUA). This EUA will remain in effect (meaning this test can be used) for the duration of the COVID-19 declaration under Section 564(b)(1) of the Act, 21 U.S.C. section 360bbb-3(b)(1), unless the authorization is terminated or revoked.  Performed at [facility], [address], [place], [place] [address]   Surgical pcr screen     Status: None   Collection Time: 06/21/21  1:05 PM   Specimen: Nasal Mucosa; Nasal Swab  Result Value Ref Range Status   MRSA, PCR NEGATIVE NEGATIVE Final   Staphylococcus aureus NEGATIVE NEGATIVE Final    Comment: (NOTE) The Xpert SA Assay (FDA approved for NASAL specimens in patients 13 years of age and older), is one component of a comprehensive surveillance program. It is not intended to diagnose infection nor to guide or monitor treatment. Performed at [facility], [address], [place], [place] [address]      Time coordinating discharge in minutes: 65  SIGNED:   Calvert Cantor, MD  [facility] 06/28/2021, 2:46 PM

## 2021-06-28 NOTE — TOC Transition Note (Signed)
Transition of Care [facility]) - CM/SW Discharge Note   Patient Details  Name: Ralph Jackson MRN: [id number] Date of Birth: 08-06-1946  [facility] CM/SW Contact:  Bartholome Bill, RN Phone Number: 06/28/2021, 3:39 PM   Clinical Narrative:     Home health orders faxed to [facility] [phone]    Barriers to Discharge: Barriers Resolved   Patient Goals and CMS Choice Patient states their goals for this hospitalization and ongoing recovery are:: to getting over this and go home CMS Medicare.gov Compare Post Acute Care list provided to:: Patient Choice offered to / list presented to : Patient   Discharge Plan and Services   Discharge Planning Services: CM Consult Post Acute Care Choice: Home Health                               Social Determinants of Health (SDOH) Interventions     Readmission Risk Interventions No flowsheet data found.

## 2021-06-29 ENCOUNTER — Encounter (HOSPITAL_COMMUNITY): Payer: Self-pay | Admitting: Orthopedic Surgery

## 2021-11-15 ENCOUNTER — Inpatient Hospital Stay (HOSPITAL_COMMUNITY)
Admission: EM | Admit: 2021-11-15 | Discharge: 2021-11-26 | DRG: 064 | Disposition: A | Discharge status: Rehabilitation Facility | Payer: Medicare Other | Attending: Internal Medicine | Admitting: Internal Medicine

## 2021-11-15 ENCOUNTER — Inpatient Hospital Stay (HOSPITAL_COMMUNITY): Payer: Medicare Other

## 2021-11-15 ENCOUNTER — Emergency Department (HOSPITAL_COMMUNITY): Payer: Medicare Other

## 2021-11-15 ENCOUNTER — Encounter (HOSPITAL_COMMUNITY): Payer: Self-pay

## 2021-11-15 DIAGNOSIS — F1027 Alcohol dependence with alcohol-induced persisting dementia: Secondary | ICD-10-CM | POA: Diagnosis present

## 2021-11-15 DIAGNOSIS — F0393 Unspecified dementia, unspecified severity, with mood disturbance: Secondary | ICD-10-CM | POA: Diagnosis present

## 2021-11-15 DIAGNOSIS — I63422 Cerebral infarction due to embolism of left anterior cerebral artery: Principal | ICD-10-CM | POA: Diagnosis present

## 2021-11-15 DIAGNOSIS — K219 Gastro-esophageal reflux disease without esophagitis: Secondary | ICD-10-CM | POA: Diagnosis present

## 2021-11-15 DIAGNOSIS — I639 Cerebral infarction, unspecified: Secondary | ICD-10-CM | POA: Diagnosis not present

## 2021-11-15 DIAGNOSIS — I10 Essential (primary) hypertension: Secondary | ICD-10-CM | POA: Diagnosis present

## 2021-11-15 DIAGNOSIS — A499 Bacterial infection, unspecified: Secondary | ICD-10-CM | POA: Diagnosis not present

## 2021-11-15 DIAGNOSIS — F1097 Alcohol use, unspecified with alcohol-induced persisting dementia: Secondary | ICD-10-CM | POA: Diagnosis not present

## 2021-11-15 DIAGNOSIS — E43 Unspecified severe protein-calorie malnutrition: Secondary | ICD-10-CM | POA: Diagnosis present

## 2021-11-15 DIAGNOSIS — F32A Depression, unspecified: Secondary | ICD-10-CM | POA: Diagnosis present

## 2021-11-15 DIAGNOSIS — Z681 Body mass index (BMI) 19 or less, adult: Secondary | ICD-10-CM

## 2021-11-15 DIAGNOSIS — Z720 Tobacco use: Secondary | ICD-10-CM | POA: Diagnosis present

## 2021-11-15 DIAGNOSIS — Z96641 Presence of right artificial hip joint: Secondary | ICD-10-CM | POA: Diagnosis present

## 2021-11-15 DIAGNOSIS — Z8616 Personal history of COVID-19: Secondary | ICD-10-CM | POA: Diagnosis not present

## 2021-11-15 DIAGNOSIS — F4322 Adjustment disorder with anxiety: Secondary | ICD-10-CM | POA: Diagnosis present

## 2021-11-15 DIAGNOSIS — Z7982 Long term (current) use of aspirin: Secondary | ICD-10-CM | POA: Diagnosis not present

## 2021-11-15 DIAGNOSIS — R64 Cachexia: Secondary | ICD-10-CM | POA: Diagnosis present

## 2021-11-15 DIAGNOSIS — R636 Underweight: Secondary | ICD-10-CM | POA: Diagnosis present

## 2021-11-15 DIAGNOSIS — F1011 Alcohol abuse, in remission: Secondary | ICD-10-CM | POA: Diagnosis present

## 2021-11-15 DIAGNOSIS — Z713 Dietary counseling and surveillance: Secondary | ICD-10-CM | POA: Diagnosis not present

## 2021-11-15 DIAGNOSIS — E785 Hyperlipidemia, unspecified: Secondary | ICD-10-CM | POA: Diagnosis present

## 2021-11-15 DIAGNOSIS — R627 Adult failure to thrive: Secondary | ICD-10-CM | POA: Diagnosis present

## 2021-11-15 DIAGNOSIS — U071 COVID-19: Secondary | ICD-10-CM | POA: Diagnosis present

## 2021-11-15 DIAGNOSIS — R29702 NIHSS score 2: Secondary | ICD-10-CM | POA: Diagnosis present

## 2021-11-15 DIAGNOSIS — F1021 Alcohol dependence, in remission: Secondary | ICD-10-CM | POA: Diagnosis present

## 2021-11-15 DIAGNOSIS — E039 Hypothyroidism, unspecified: Secondary | ICD-10-CM | POA: Diagnosis present

## 2021-11-15 DIAGNOSIS — N390 Urinary tract infection, site not specified: Secondary | ICD-10-CM | POA: Diagnosis not present

## 2021-11-15 DIAGNOSIS — Z79899 Other long term (current) drug therapy: Secondary | ICD-10-CM

## 2021-11-15 DIAGNOSIS — R3915 Urgency of urination: Secondary | ICD-10-CM | POA: Diagnosis not present

## 2021-11-15 DIAGNOSIS — H9193 Unspecified hearing loss, bilateral: Secondary | ICD-10-CM | POA: Diagnosis present

## 2021-11-15 DIAGNOSIS — I63522 Cerebral infarction due to unspecified occlusion or stenosis of left anterior cerebral artery: Secondary | ICD-10-CM | POA: Diagnosis not present

## 2021-11-15 DIAGNOSIS — F419 Anxiety disorder, unspecified: Secondary | ICD-10-CM | POA: Diagnosis present

## 2021-11-15 DIAGNOSIS — K5900 Constipation, unspecified: Secondary | ICD-10-CM | POA: Diagnosis present

## 2021-11-15 DIAGNOSIS — G8191 Hemiplegia, unspecified affecting right dominant side: Secondary | ICD-10-CM | POA: Diagnosis present

## 2021-11-15 DIAGNOSIS — F418 Other specified anxiety disorders: Secondary | ICD-10-CM | POA: Diagnosis present

## 2021-11-15 DIAGNOSIS — I959 Hypotension, unspecified: Secondary | ICD-10-CM | POA: Diagnosis not present

## 2021-11-15 DIAGNOSIS — R001 Bradycardia, unspecified: Secondary | ICD-10-CM | POA: Diagnosis not present

## 2021-11-15 DIAGNOSIS — R29810 Facial weakness: Secondary | ICD-10-CM | POA: Diagnosis present

## 2021-11-15 DIAGNOSIS — Z79890 Hormone replacement therapy (postmenopausal): Secondary | ICD-10-CM

## 2021-11-15 DIAGNOSIS — R8290 Unspecified abnormal findings in urine: Secondary | ICD-10-CM | POA: Diagnosis not present

## 2021-11-15 DIAGNOSIS — Z7902 Long term (current) use of antithrombotics/antiplatelets: Secondary | ICD-10-CM | POA: Diagnosis not present

## 2021-11-15 DIAGNOSIS — I69351 Hemiplegia and hemiparesis following cerebral infarction affecting right dominant side: Secondary | ICD-10-CM | POA: Diagnosis present

## 2021-11-15 DIAGNOSIS — I6389 Other cerebral infarction: Secondary | ICD-10-CM | POA: Diagnosis not present

## 2021-11-15 LAB — COMPREHENSIVE METABOLIC PANEL
ALT: 16 U/L (ref 0–44)
AST: 24 U/L (ref 15–41)
Albumin: 4.1 g/dL (ref 3.5–5.0)
Alkaline Phosphatase: 65 U/L (ref 38–126)
Anion gap: 8 (ref 5–15)
BUN: 18 mg/dL (ref 8–23)
CO2: 29 mmol/L (ref 22–32)
Calcium: 9.2 mg/dL (ref 8.9–10.3)
Chloride: 102 mmol/L (ref 98–111)
Creatinine, Ser: 0.96 mg/dL (ref 0.61–1.24)
GFR, Estimated: >60 mL/min (ref >60)
Glucose, Bld: 145 mg/dL — ABNORMAL HIGH (ref 70–99)
Potassium: 4.3 mmol/L (ref 3.5–5.1)
Sodium: 139 mmol/L (ref 135–145)
Total Bilirubin: 0.4 mg/dL (ref 0.3–1.2)
Total Protein: 7.2 g/dL (ref 6.5–8.1)

## 2021-11-15 LAB — URINALYSIS, ROUTINE W REFLEX MICROSCOPIC
Bilirubin Urine: NEGATIVE
Glucose, UA: NEGATIVE mg/dL
Hgb urine dipstick: NEGATIVE
Ketones, ur: NEGATIVE mg/dL
Leukocytes,Ua: NEGATIVE
Nitrite: NEGATIVE
Protein, ur: NEGATIVE mg/dL
Specific Gravity, Urine: 1.018 (ref 1.005–1.030)
pH: 6 (ref 5.0–8.0)

## 2021-11-15 LAB — CBC WITH DIFFERENTIAL/PLATELET
Abs Immature Granulocytes: 0.02 10*3/uL (ref 0.00–0.07)
Basophils Absolute: 0 10*3/uL (ref 0.0–0.1)
Basophils Relative: 0 %
Eosinophils Absolute: 0 10*3/uL (ref 0.0–0.5)
Eosinophils Relative: 0 %
HCT: 39.3 % (ref 39.0–52.0)
Hemoglobin: 13.3 g/dL (ref 13.0–17.0)
Immature Granulocytes: 0 %
Lymphocytes Relative: 17 %
Lymphs Abs: 1.6 10*3/uL (ref 0.7–4.0)
MCH: 31.1 pg (ref 26.0–34.0)
MCHC: 33.8 g/dL (ref 30.0–36.0)
MCV: 92 fL (ref 80.0–100.0)
Monocytes Absolute: 0.9 10*3/uL (ref 0.1–1.0)
Monocytes Relative: 9 %
Neutro Abs: 7 10*3/uL (ref 1.7–7.7)
Neutrophils Relative %: 74 %
Platelets: 311 10*3/uL (ref 150–400)
RBC: 4.27 MIL/uL (ref 4.22–5.81)
RDW: 14.6 % (ref 11.5–15.5)
WBC: 9.5 10*3/uL (ref 4.0–10.5)
nRBC: 0 % (ref 0.0–0.2)

## 2021-11-15 LAB — RESP PANEL BY RT-PCR (FLU A&B, COVID) ARPGX2
Influenza A by PCR: NEGATIVE
Influenza B by PCR: NEGATIVE
SARS Coronavirus 2 by RT PCR: POSITIVE — AB

## 2021-11-15 LAB — AMMONIA: Ammonia: 19 umol/L (ref 9–35)

## 2021-11-15 LAB — LIPASE, BLOOD: Lipase: 36 U/L (ref 11–51)

## 2021-11-15 LAB — MAGNESIUM: Magnesium: 2.1 mg/dL (ref 1.7–2.4)

## 2021-11-15 IMAGING — MR MR HEAD W/O CM
6 of 11 series · 24 of 48 positions shown · non-contrast
Comparison: No prior MRI, correlation is made with CT and CTA head
neck [DATE]

CLINICAL DATA: Stroke, follow-up, right-sided arm and leg weakness

EXAM:
MRI HEAD WITHOUT CONTRAST
MRA HEAD WITHOUT CONTRAST
MRA NECK WITHOUT  CONTRAST
TECHNIQUE: Multiplanar, multi-echo pulse sequences of the brain and surrounding
structures were acquired without intravenous contrast. Angiographic
images of the Circle of Willis were acquired using MRA technique
without intravenous contrast. Angiographic images of the neck were
acquired using MRA technique without intravenous contrast. Carotid
stenosis measurements (when applicable) are obtained utilizing
NASCET criteria, using the distal internal carotid diameter as the
denominator.

[Series 2: DWI · axial · 3.0mm · 0.94mm/px · z∈[-62,+90]mm · 7 of 104 slices shown (1 of 2)]
[im 1/104]
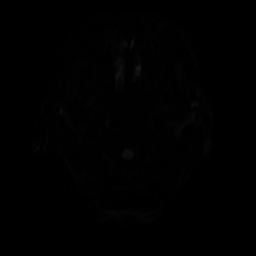
[im 18/104]
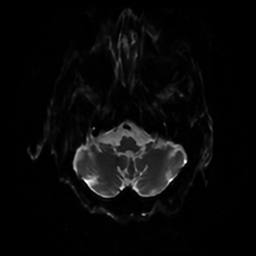
[im 35/104]
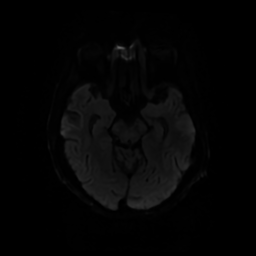
[im 52/104]
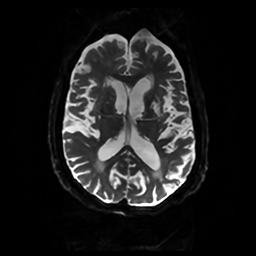
[im 69/104]
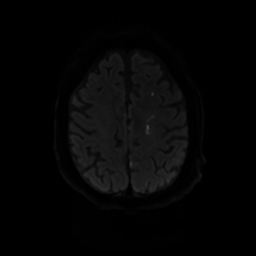
[im 86/104]
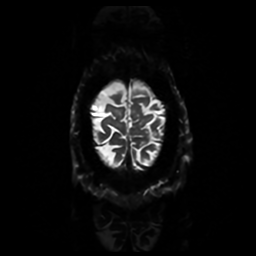
[im 104/104]
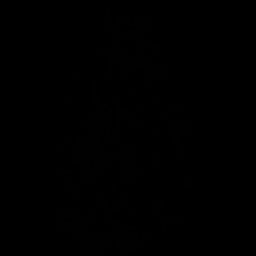

[Series 3: DWI · coronal · 4.0mm · 0.94mm/px · 5 of 74 slices shown (2 of 2)]
[im 1/74]
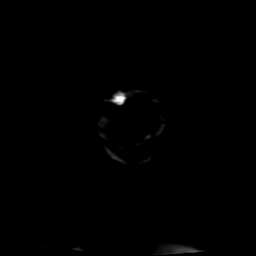
[im 19/74]
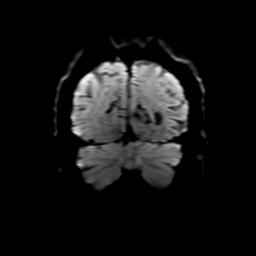
[im 37/74]
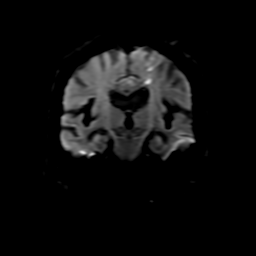
[im 55/74]
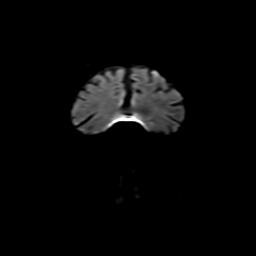
[im 74/74]
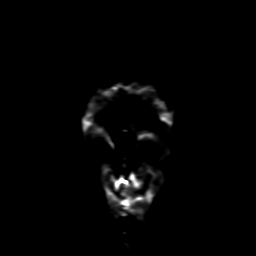

[Series 5: FLAIR · sagittal · 5.0mm · 0.23mm/px · 2 of 25 slices shown (1 of 2)]
[im 1/25]
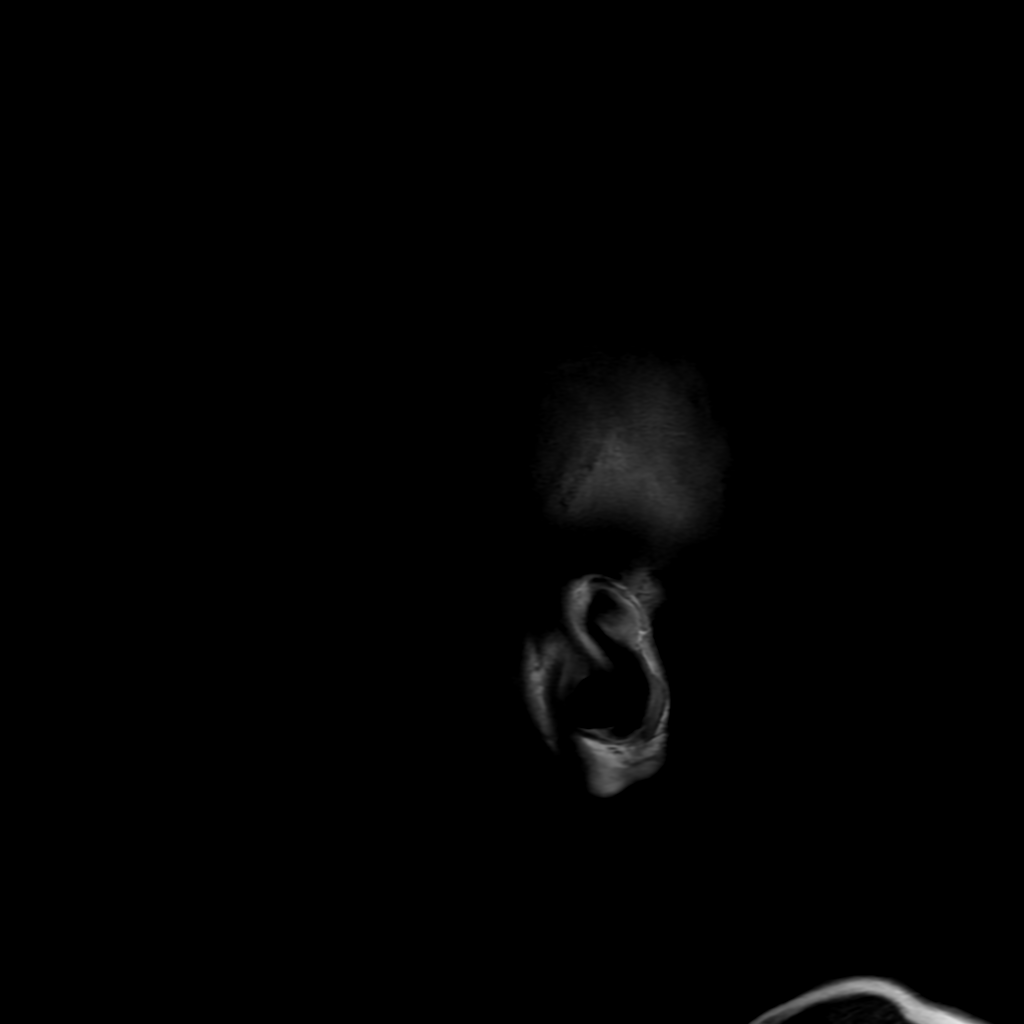
[im 25/25]
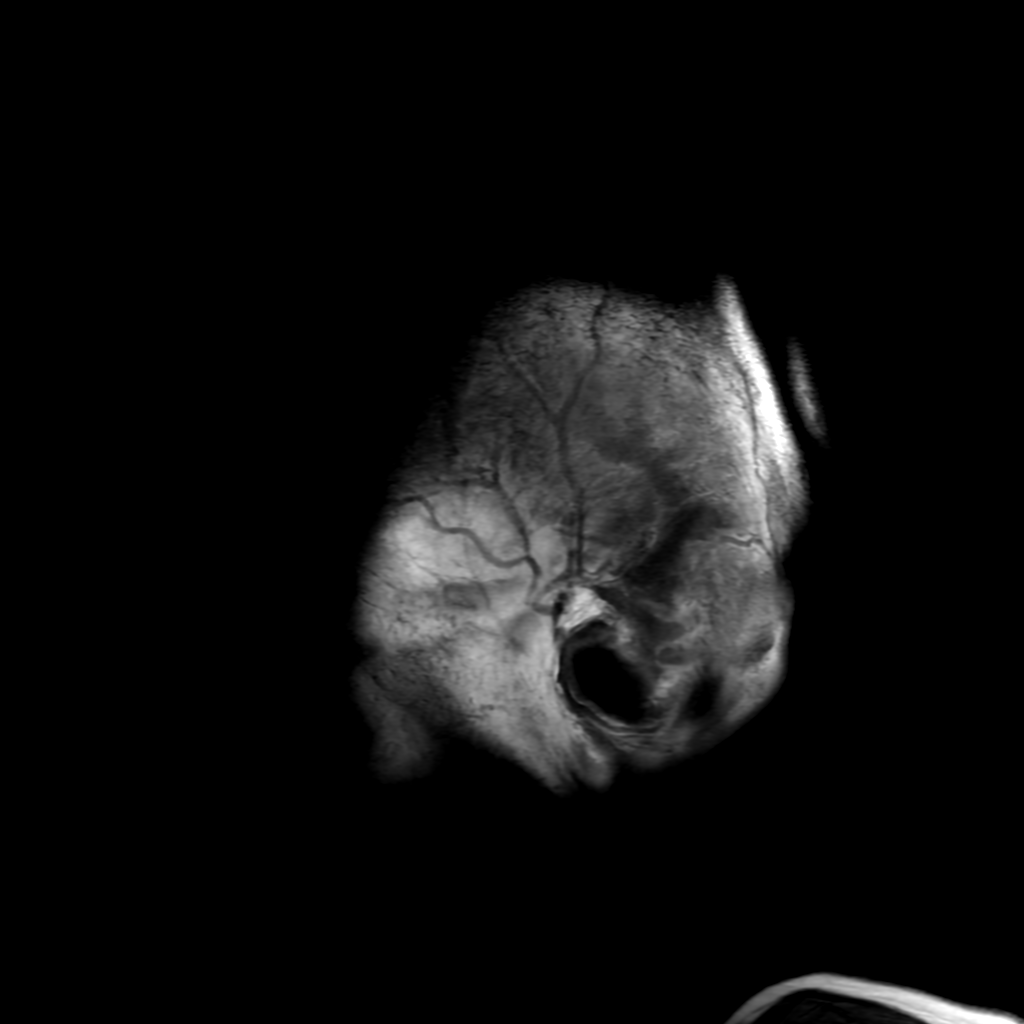

[Series 7: FLAIR · axial · 4.0mm · 0.45mm/px · z∈[-62,+91]mm · 3 of 36 slices shown (2 of 2)]
[im 1/36]
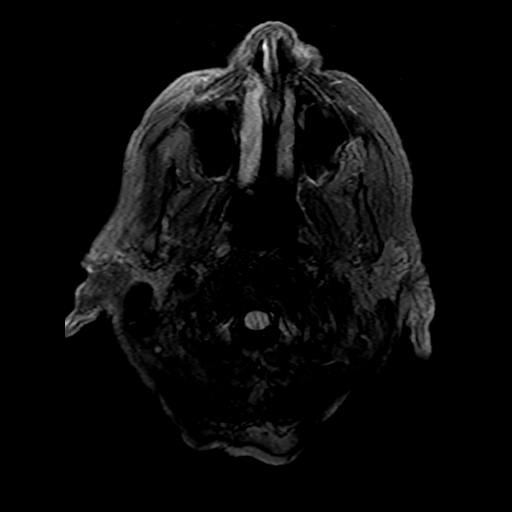
[im 18/36]
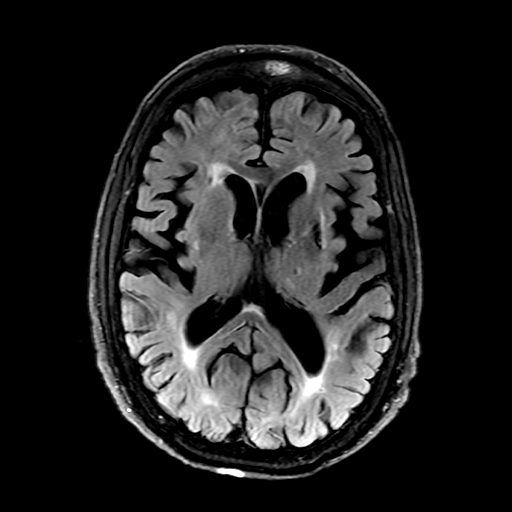
[im 36/36]
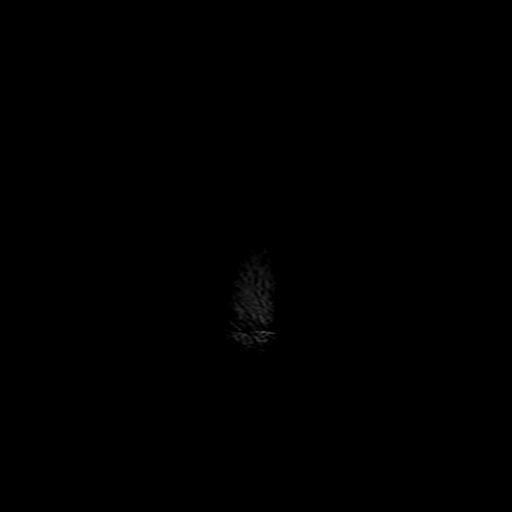

[Series 250: ADC · axial · 3.0mm · 0.94mm/px · z∈[-62,+90]mm · 4 of 52 slices shown (1 of 2)]
[im 1/52]
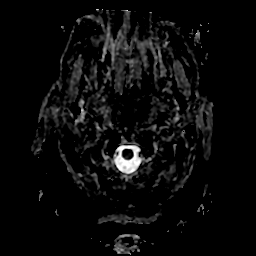
[im 18/52]
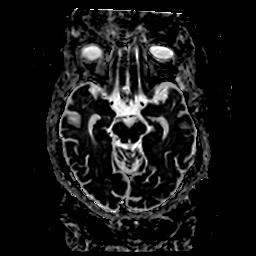
[im 35/52]
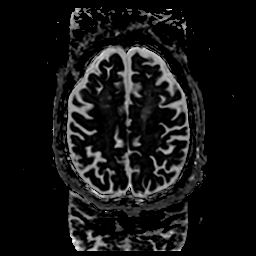
[im 52/52]
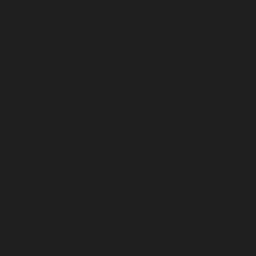

[Series 350: ADC · coronal · 4.0mm · 0.94mm/px · 3 of 36 slices shown (2 of 2)]
[im 1/36]
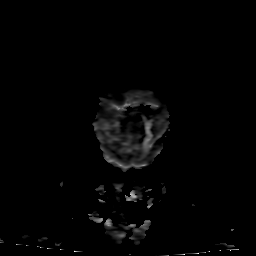
[im 18/36]
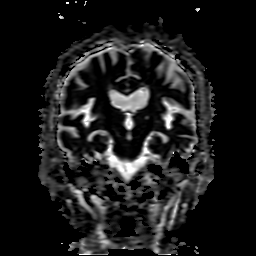
[im 36/36]
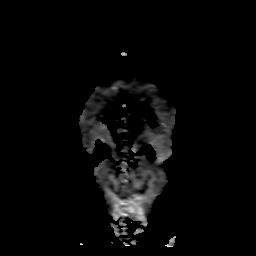

[24 of 48 positions shown; findings below may reference images not displayed]

FINDINGS: MRI HEAD FINDINGS

Brain: Restricted diffusion with ADC correlates in the left frontal
lobe cortex and white matter (series 2, images 34-45 and series 3,
images 16-28). These areas are associated with increased T2 signal.
No evidence of acute hemorrhage, mass, mass effect, or midline
shift. Hydrocephalus or extra-axial collection.

Confluent T2 hyperintense signal in the periventricular white
matter, likely the sequela of severe chronic small vessel ischemic
disease. Lacunar infarcts in the left thalamus, basal ganglia, and
corona radiata. Degree of cerebral atrophy is advanced for age, with
ex vacuo dilatation of the ventricles.

Vascular: Normal flow voids.

Skull and upper cervical spine: Normal marrow signal.

Sinuses/Orbits: Mucous retention cyst in the left sphenoid sinus.
Air-fluid level in the right sphenoid sinus. Mild mucosal thickening
in the ethmoid air cells. Status post bilateral lens replacements.

Other: Fluid in the right-greater-than-left mastoid air cells.

MRA HEAD FINDINGS

Anterior circulation: Both internal carotid arteries are patent to
the termini, without significant stenosis.

A1 segments patent. Normal anterior communicating artery. Mild
multifocal narrowing in the mid to distal left A2 and A3 segments
(series 2, image 112, 129, 156). Anterior cerebral arteries are
otherwise patent to their distal aspects.

No right M1 stenosis or occlusion. Mild narrowing in the mid left M1
(series 2, image 91). Normal MCA bifurcations. Distal MCA branches
perfused and symmetric.

Posterior circulation: Vertebral arteries patent to the
vertebrobasilar junction without stenosis. Posterior inferior
cerebral arteries patent bilaterally.

Basilar patent to its distal aspect. Superior cerebellar arteries
patent bilaterally.

Patent P1 segments. Moderate to severe narrowing in the distal left
P3 (series 2, image 98). PCAs otherwise perfused to their distal
aspects without stenosis. The bilateral posterior communicating
arteries are not visualized.

Anatomic variants: None significant

MRA NECK FINDINGS

Common, internal, and external carotid arteries are patent, without
hemodynamically significant stenosis. Extracranial vertebral
arteries are patent, without hemodynamically significant stenosis.
IMPRESSION: 1. Acute and/or subacute infarcts in the left frontal lobe cortex
and white matter. No evidence of hemorrhagic conversion, mass
effect, or midline shift.
2. No intracranial large vessel occlusion. Moderate to severe
narrowing of the distal left P3 mild narrowing in the mid left M1,
and mild multifocal narrowing in the mid to distal left A2 and A3
segments.
3.  No hemodynamically significant stenosis in the neck.

These results will be called to the ordering clinician or
representative by the Radiologist Assistant, and communication
documented in the PACS or [REDACTED].

## 2021-11-15 IMAGING — CT CT HEAD W/O CM
3 series · 14 of 47 positions shown, 16 images · non-contrast
Comparison: [V1]

CLINICAL DATA: Neuro deficit, acute, stroke suspected; right sided
arm and leg weakness x 4 days



[Series 2: head wo · axial · 0.47mm/px · z∈[-121,+4]mm · 8 of 31 slices shown, 10 images]
[im 3/31  brain]
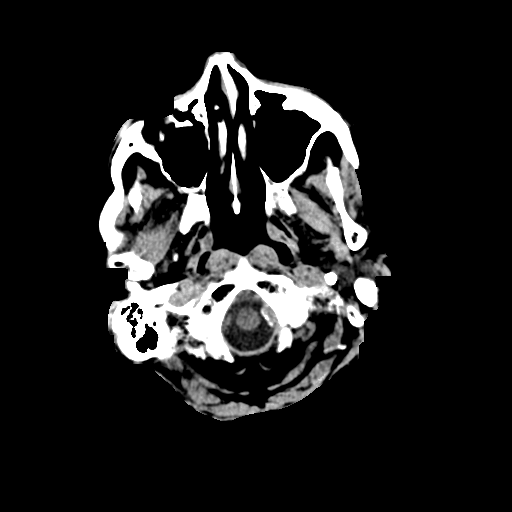
[im 3/31  bone]
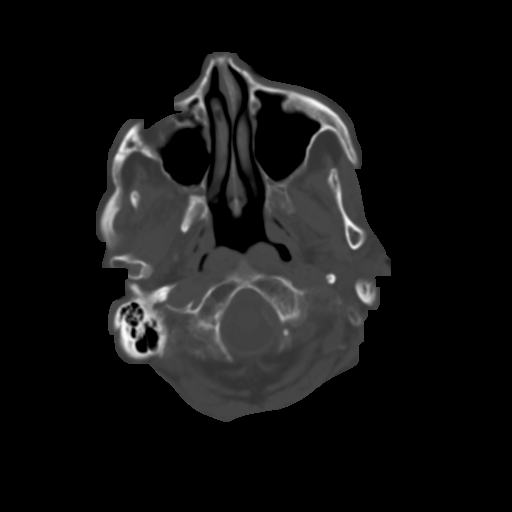
[im 7/31  brain]
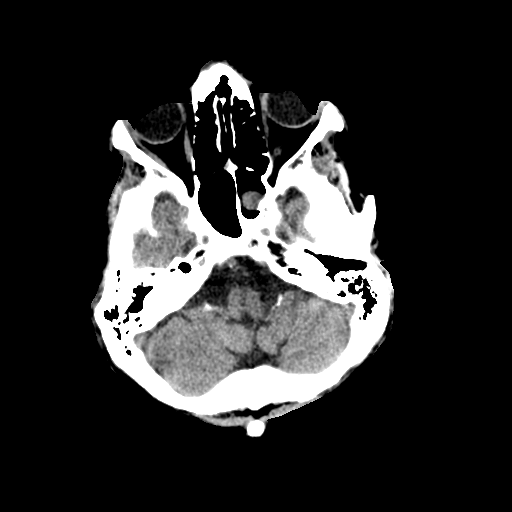
[im 10/31  brain]
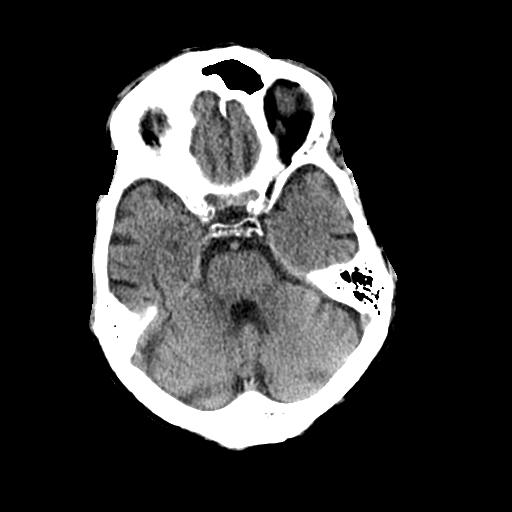
[im 14/31  brain]
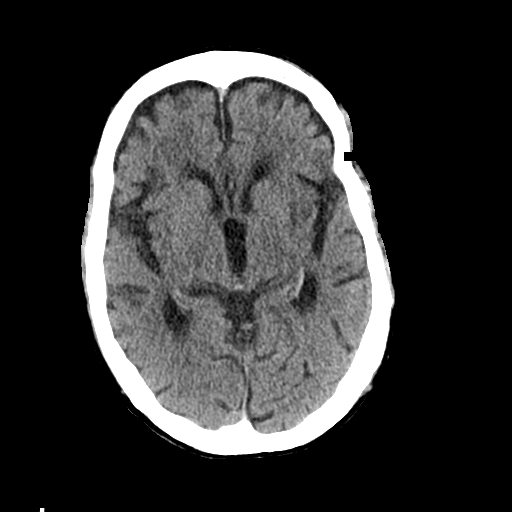
[im 17/31  brain]
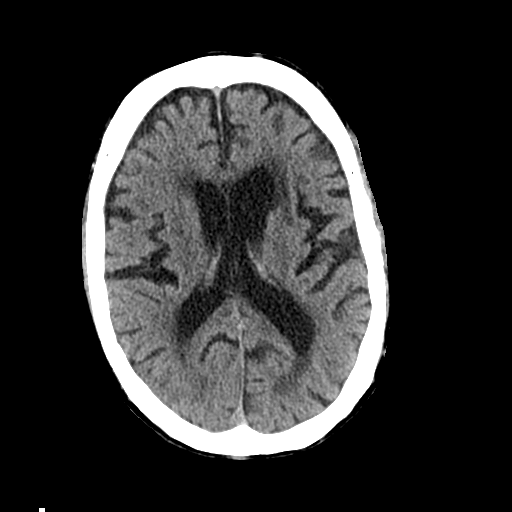
[im 17/31  bone]
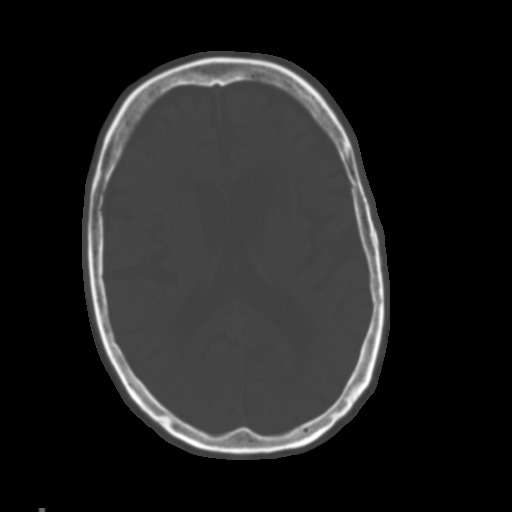
[im 21/31  brain]
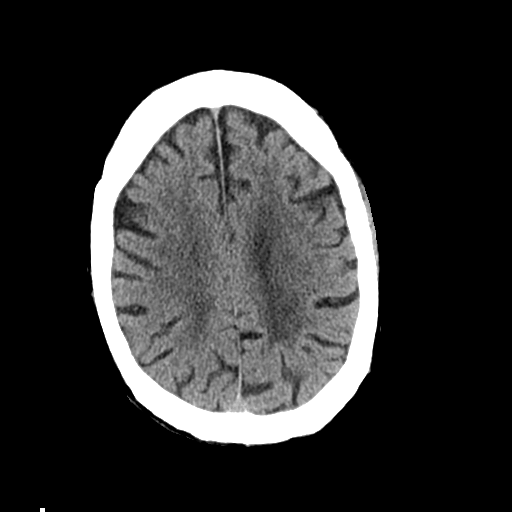
[im 24/31  brain]
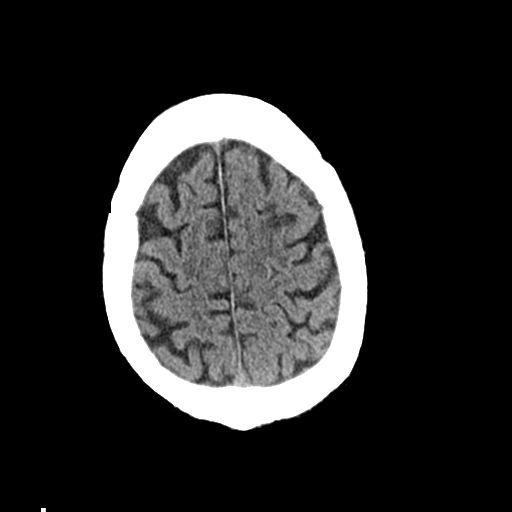
[im 28/31  brain]
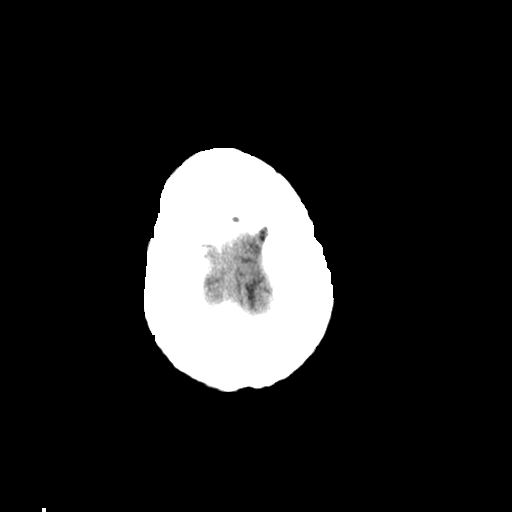

[Series 5: coronal soft tissue · coronal · 0.30mm/px · 3 of 74 slices shown]
[im 25/74  brain]
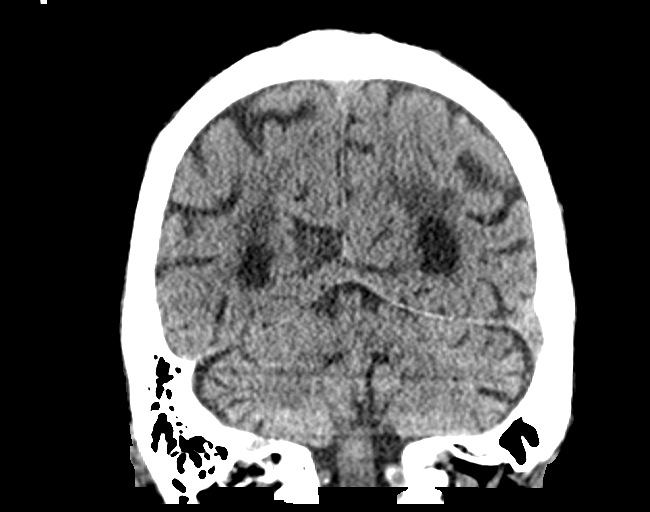
[im 33/74  brain]
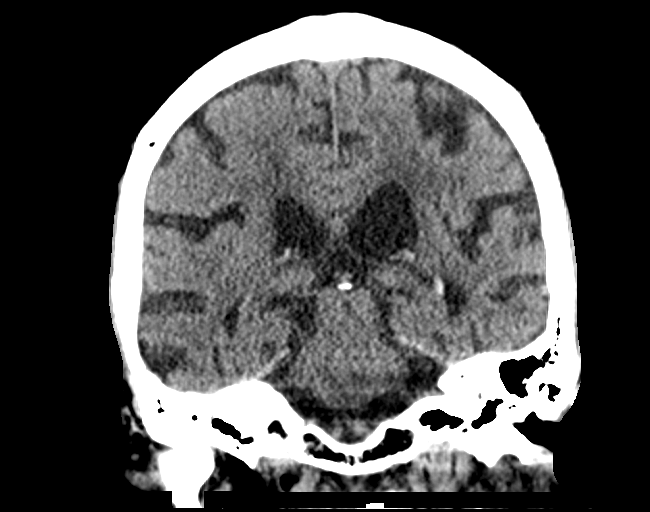
[im 41/74  brain]
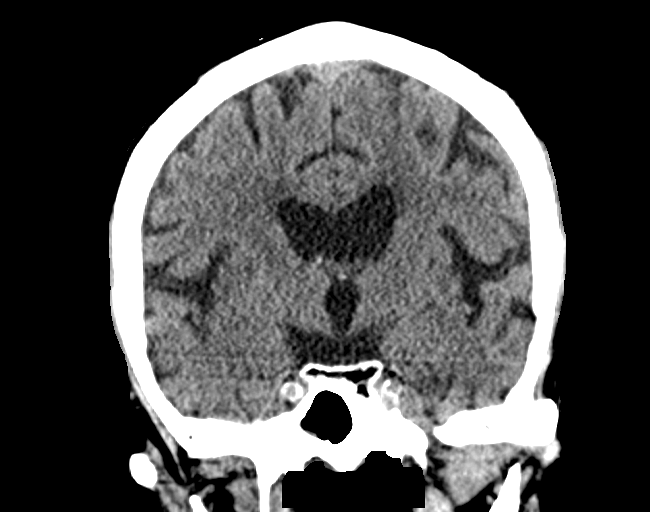

[Series 6: sagittal soft tissue · sagittal · 0.30mm/px · 3 of 55 slices shown]
[im 19/55  brain]
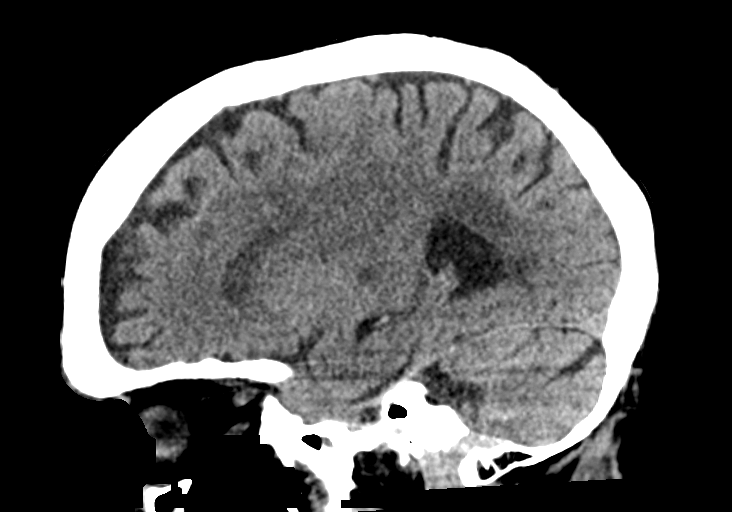
[im 28/55  brain]
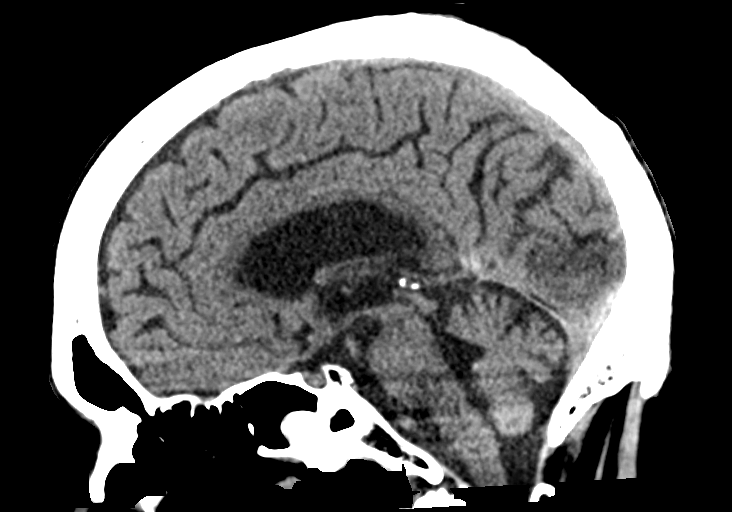
[im 37/55  brain]
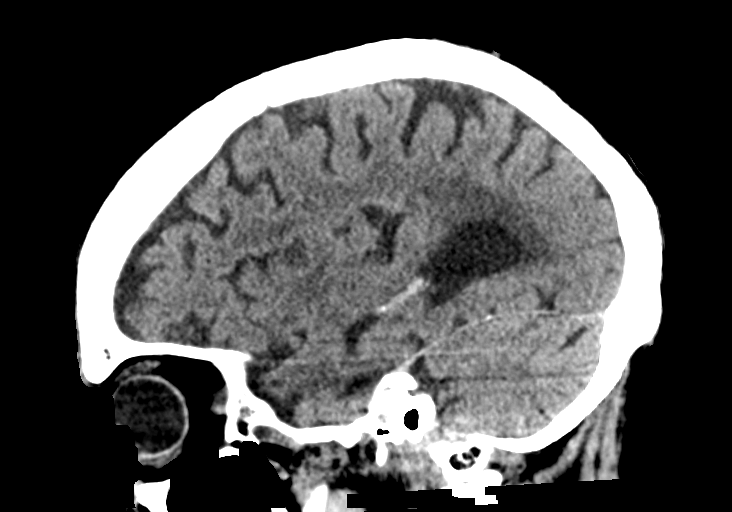

[14 of 47 positions shown; findings below may reference images not displayed]

FINDINGS: Brain: There is no acute intracranial hemorrhage. Small areas of
hypoattenuation with loss of gray-white differentiation in the left
frontal lobe involving the superior gyrus extending into the centrum
semiovale. Chronic infarct of the left basal ganglia and adjacent
white matter. Additional patchy and confluent areas of low-density
in the supratentorial white matter probably reflect chronic
microvascular ischemic changes. Prominence of the ventricles and
sulci reflects parenchymal volume loss. No significant mass effect.
No extra-axial collection.

Vascular: No hyperdense vessel.There is atherosclerotic
calcification at the skull base.

Skull: Calvarium is unremarkable.

Sinuses/Orbits: Retained secretions right sphenoid. Left sphenoid
retention cyst. No acute abnormality of the orbits.

Other: Mastoid air cells are clear.
IMPRESSION: Small cortical/subcortical left frontal infarcts are likely acute to
subacute.

Chronic infarcts and chronic microvascular ischemic changes.

## 2021-11-15 IMAGING — CT CT ANGIO HEAD
2 of 7 series · 8 of 33 positions shown · IV contrast (OMNIPAQUE 350)
Comparison: Head CT same day.  Cervical spine CT [DATE]

CLINICAL DATA: Stroke, follow-up.  Weakness and difficulty eating.

EXAM:
CT ANGIOGRAPHY HEAD AND NECK
TECHNIQUE: Multidetector CT imaging of the head and neck was performed using
the standard protocol during bolus administration of intravenous
contrast. Multiplanar CT image reconstructions and MIPs were
obtained to evaluate the vascular anatomy. Carotid stenosis
measurements (when applicable) are obtained utilizing NASCET
criteria, using the distal internal carotid diameter as the
denominator.

[Series 6: cta head neck · axial · 0.53mm/px · z∈[-236,-122]mm · 2 of 157 slices shown]
[im 53/157  soft-tissue]
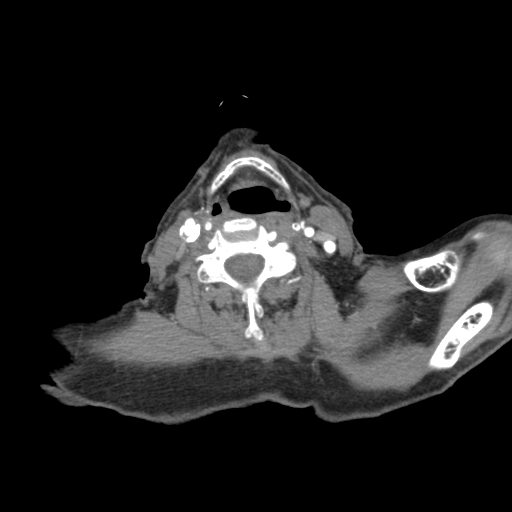
[im 105/157  soft-tissue]
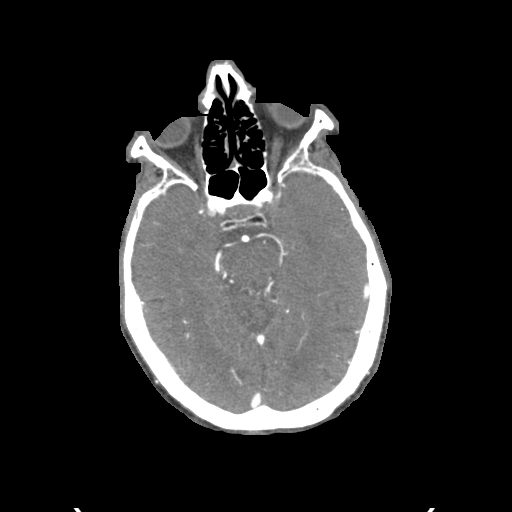

[Series 8: ax thin · axial · 0.46mm/px · z∈[-295,-66]mm · 6 of 312 slices shown]
[im 45/312  soft-tissue]
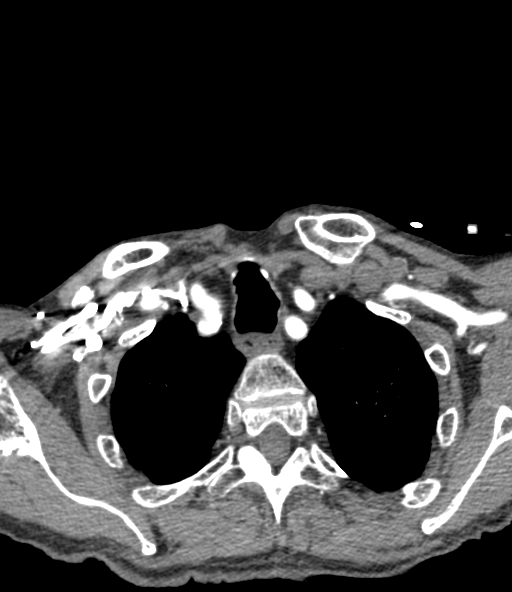
[im 89/312  bone]
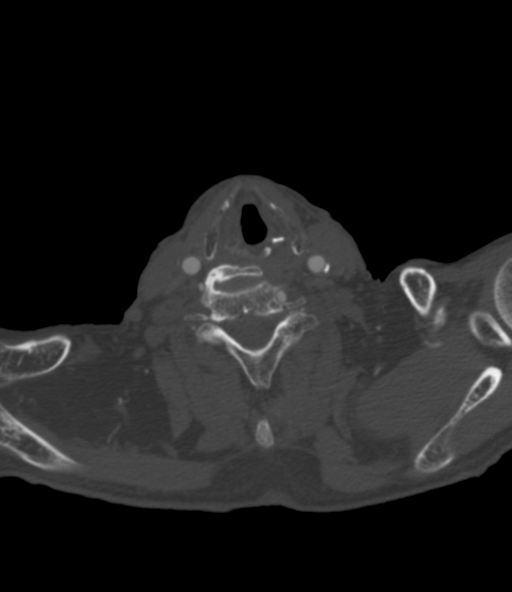
[im 134/312  soft-tissue]
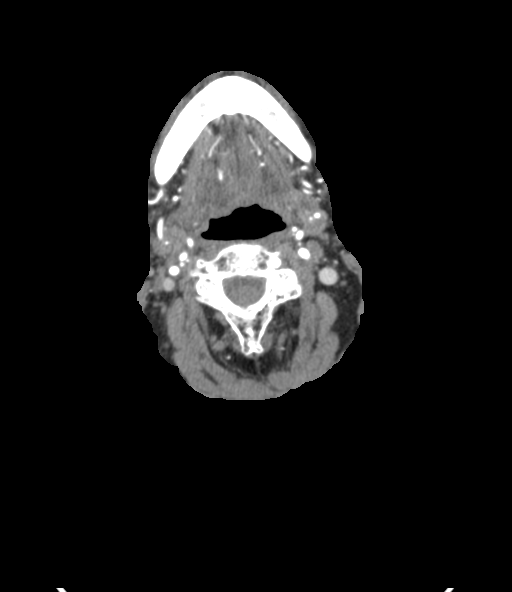
[im 178/312  bone]
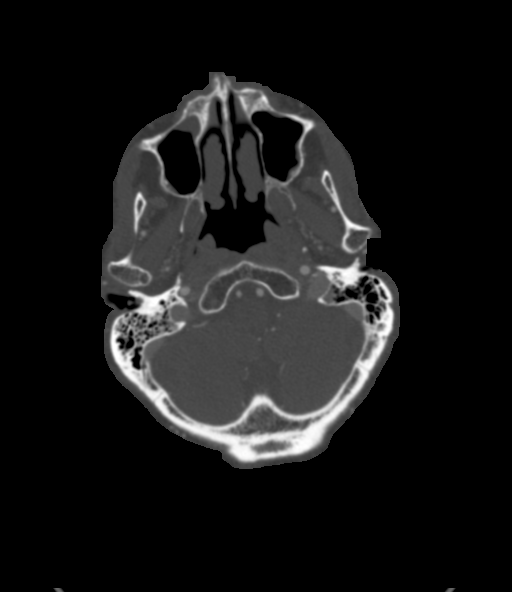
[im 223/312  soft-tissue]
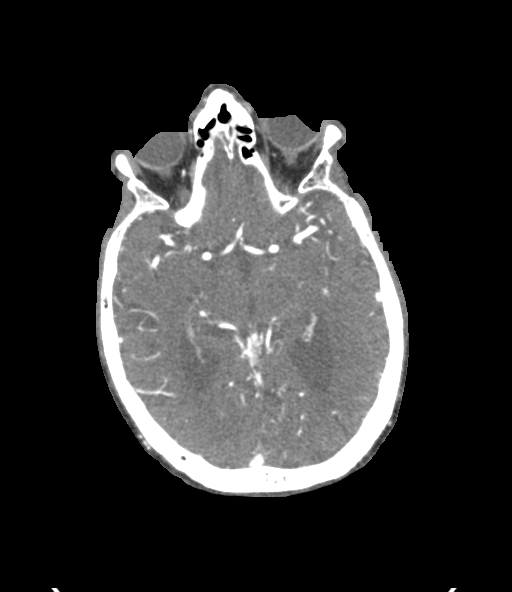
[im 267/312  bone]
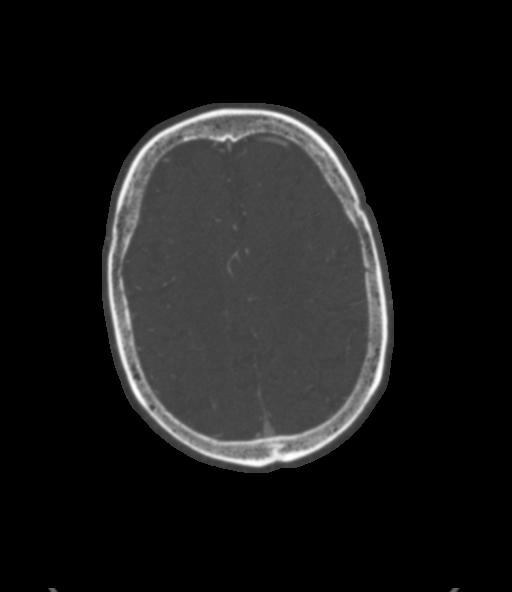

[8 of 33 positions shown; findings below may reference images not displayed]

RADIATION DOSE REDUCTION: This exam was performed according to the
departmental dose-optimization program which includes automated
exposure control, adjustment of the mA and/or kV according to
patient size and/or use of iterative reconstruction technique.

CONTRAST:  75mL OMNIPAQUE IOHEXOL 350 MG/ML SOLN
FINDINGS: CTA NECK FINDINGS

Aortic arch: Aortic atherosclerosis. Branching pattern is normal
without origin stenosis.

Right carotid system: Common carotid artery widely patent to the
bifurcation. Soft and calcified plaque at the carotid bifurcation
and ICA bulb but no stenosis compared to the more distal cervical
ICA diameter.

Left carotid system: Common carotid artery widely patent to the
bifurcation. Minimal calcified plaque at the ICA bulb but no
stenosis.

Vertebral arteries: Both vertebral artery origins are widely patent.
Both vertebral arteries appear normal through the cervical region to
the foramen magnum.

Skeleton: No significant cervical degenerative disease or other
finding.

Other neck: No mass or lymphadenopathy.

Upper chest: Chronic scarring at the left apex, unchanged since [4D]
and therefore benign.

Review of the MIP images confirms the above findings

CTA HEAD FINDINGS

Anterior circulation: Both internal carotid arteries are patent
through the skull base and siphon region. Ordinary siphon
atherosclerosis but without stenosis greater than 30%. The anterior
and middle cerebral vessels are patent without proximal stenosis,
aneurysm or vascular malformation. The patient does show severe
serial stenoses within the left anterior cerebral artery. There is
also focal stenosis at the origin of the right M2 inferior division.

Posterior circulation: Both vertebral arteries are patent through
the foramen magnum to the basilar. No basilar stenosis. Posterior
circulation branch vessels show distal vessel atherosclerotic
change, including a severe stenosis at the left P2 region.

Venous sinuses: Patent and normal.

Anatomic variants: None significant.

Review of the MIP images confirms the above findings
IMPRESSION: Atherosclerotic disease at both carotid bifurcations but no
stenosis.

Atherosclerotic disease in both carotid siphon regions but no
stenosis greater than 30%.

Serial severe stenoses in the left anterior cerebral artery.

Focal stenosis at the proximal right MCA inferior division.

Focal stenosis in the left PCA at the P2 level.

## 2021-11-15 IMAGING — DX DG CHEST 1V PORT
1 series · 1 of 1 positions shown · non-contrast
Comparison: [DATE]

CLINICAL DATA: Evaluate for signs of aspiration.

EXAM:
PORTABLE CHEST 1 VIEW

[chest ap]
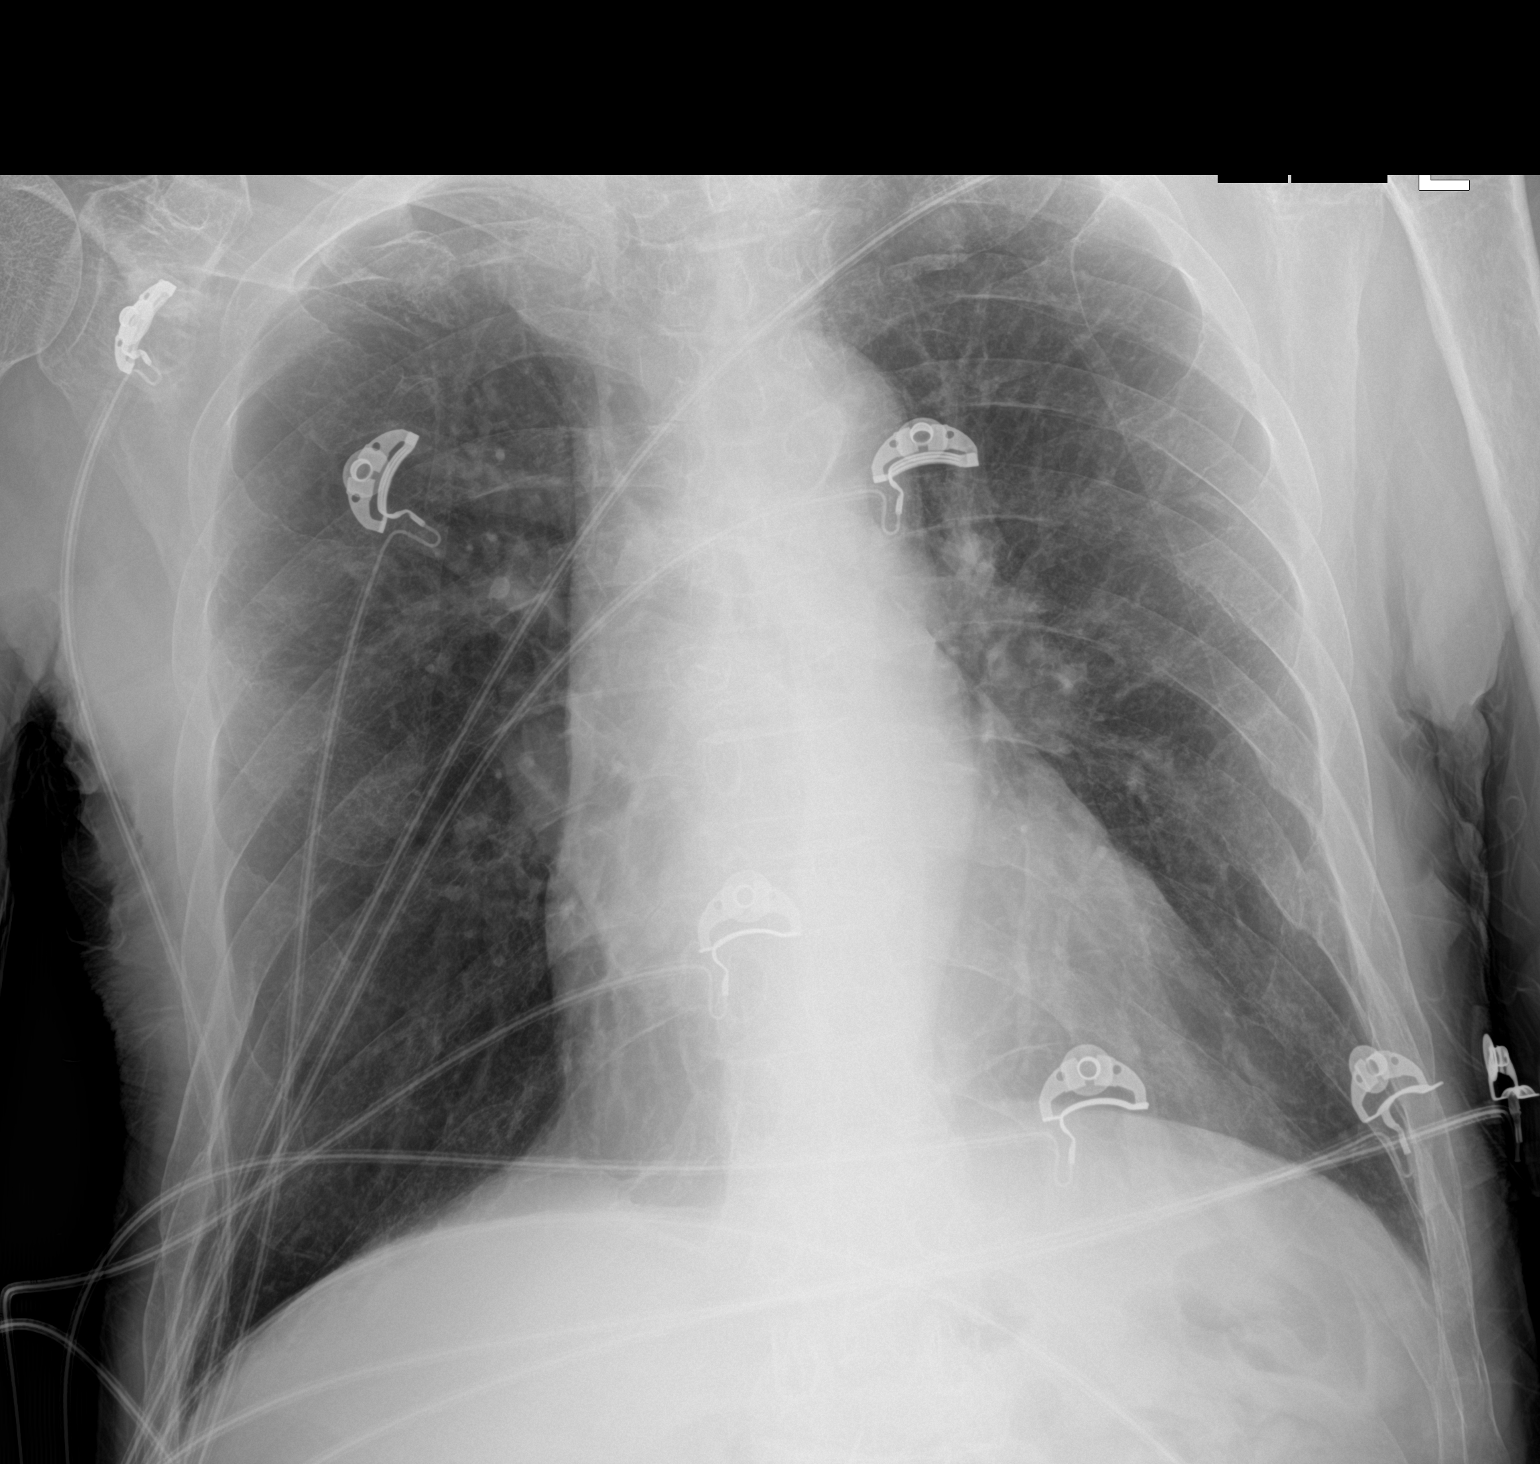

[1 of 1 positions shown; findings below may reference images not displayed]

FINDINGS: Heart size and mediastinal contours are unremarkable. There is no
pleural effusion or edema. No airspace opacities identified to
suggest pneumonia or aspiration. Remote appearing left
posterolateral rib fractures.
IMPRESSION: No acute cardiopulmonary abnormalities.

## 2021-11-15 IMAGING — MR MR MRA HEAD W/O CM
2 series · 17 of 48 positions shown · non-contrast
Comparison: No prior MRI, correlation is made with CT and CTA head
neck [DATE]

CLINICAL DATA: Stroke, follow-up, right-sided arm and leg weakness

EXAM:
MRI HEAD WITHOUT CONTRAST
MRA HEAD WITHOUT CONTRAST
MRA NECK WITHOUT  CONTRAST
TECHNIQUE: Multiplanar, multi-echo pulse sequences of the brain and surrounding
structures were acquired without intravenous contrast. Angiographic
images of the Circle of Willis were acquired using MRA technique
without intravenous contrast. Angiographic images of the neck were
acquired using MRA technique without intravenous contrast. Carotid
stenosis measurements (when applicable) are obtained utilizing
NASCET criteria, using the distal internal carotid diameter as the
denominator.

[Series 2: ax (id) · axial · 1.0mm · 0.43mm/px · z∈[-47,+39]mm · 15 of 183 slices shown]
[im 1/183]
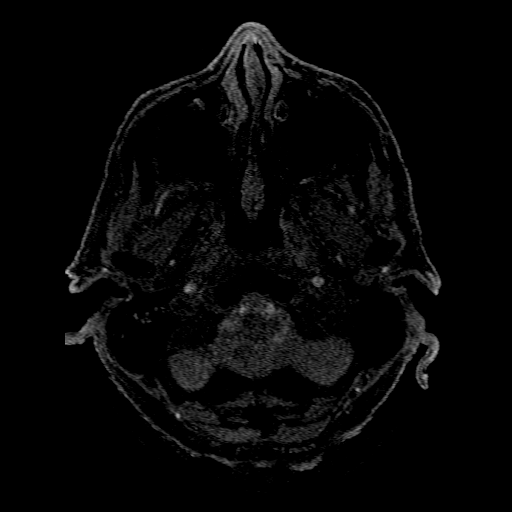
[im 5/183]
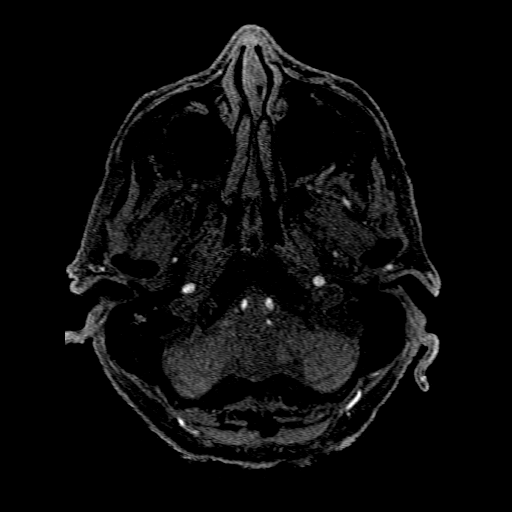
[im 9/183]
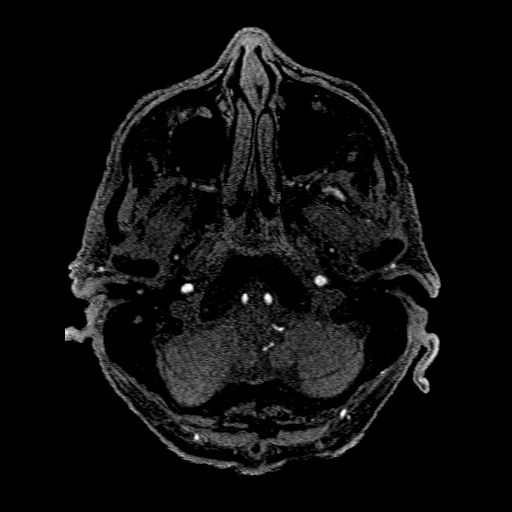
[im 13/183]
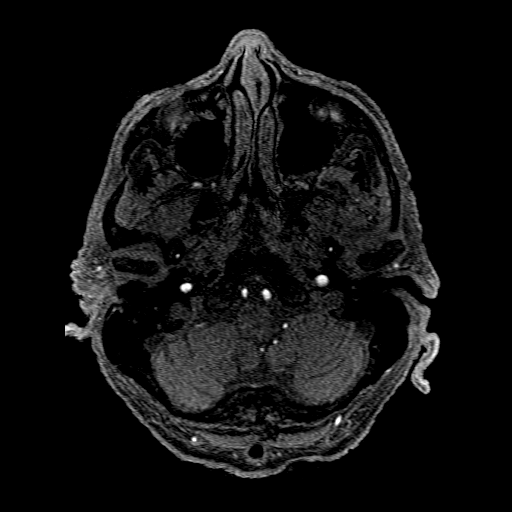
[im 17/183]
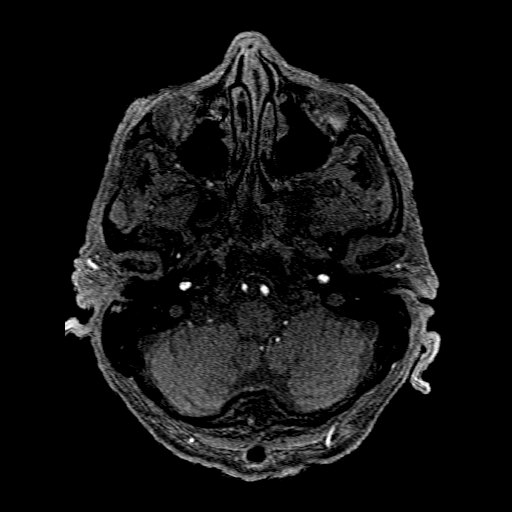
[im 29/183]
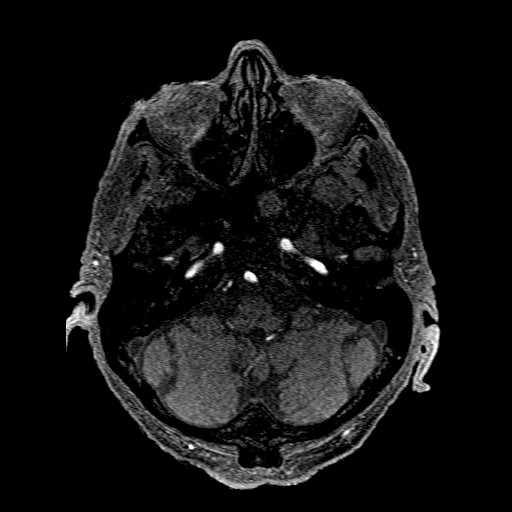
[im 33/183]
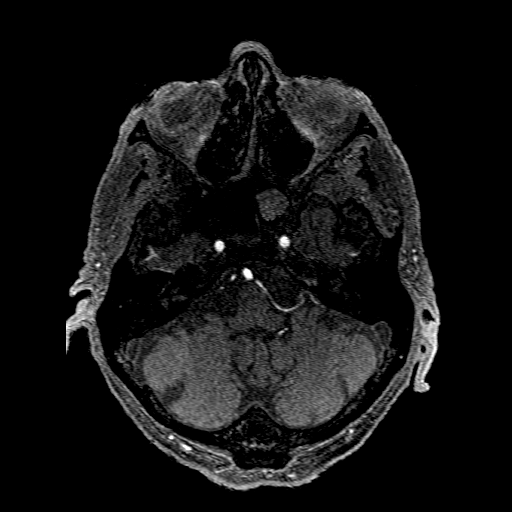
[im 57/183]
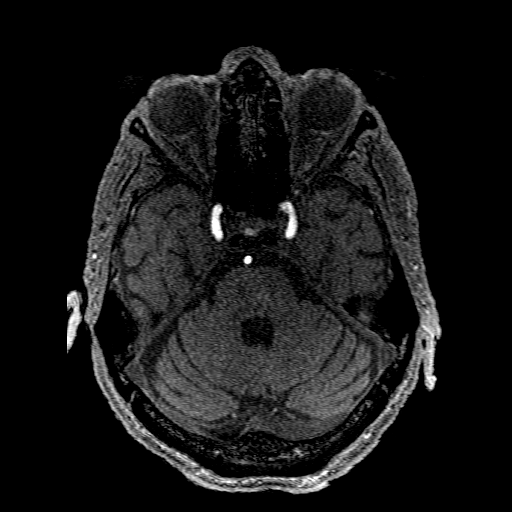
[im 81/183]
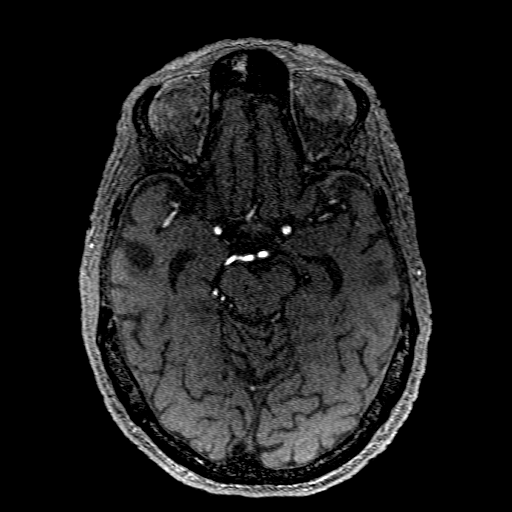
[im 94/183]
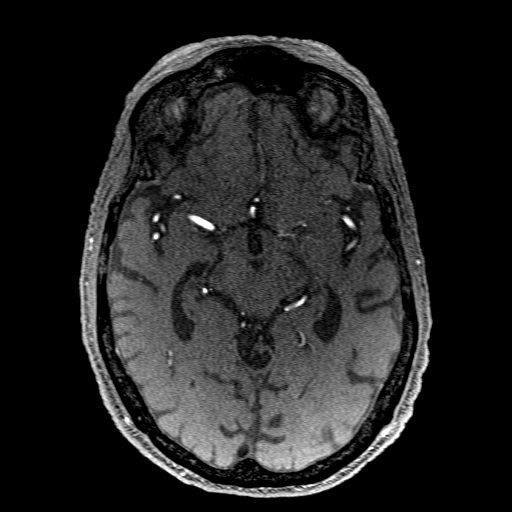
[im 102/183]
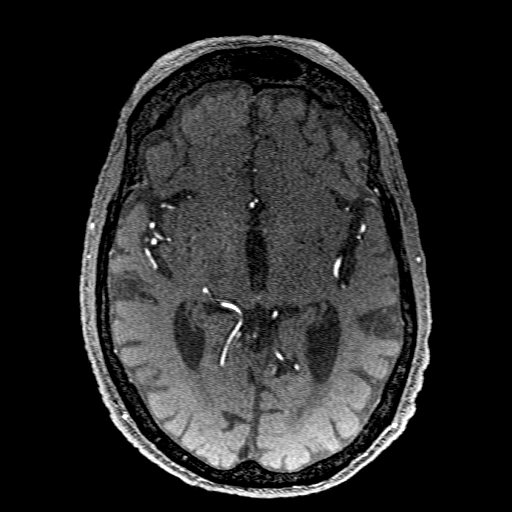
[im 126/183]
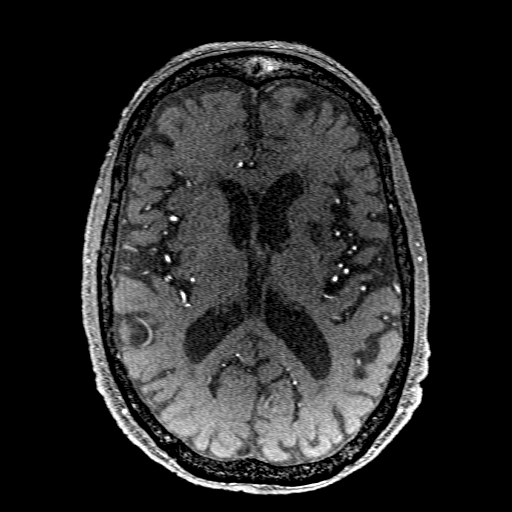
[im 150/183]
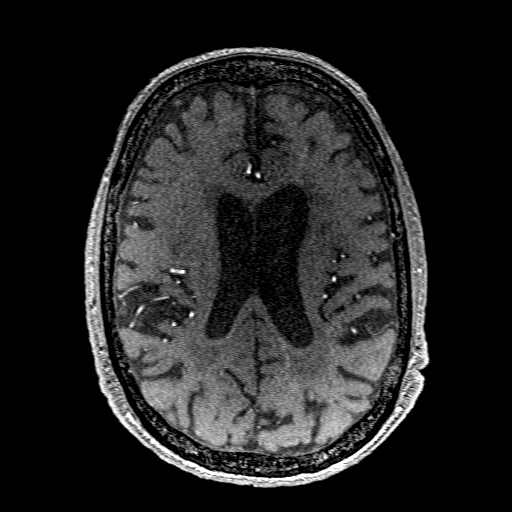
[im 154/183]
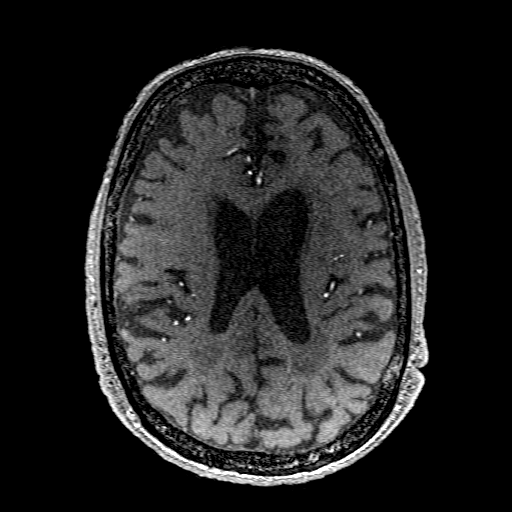
[im 174/183]
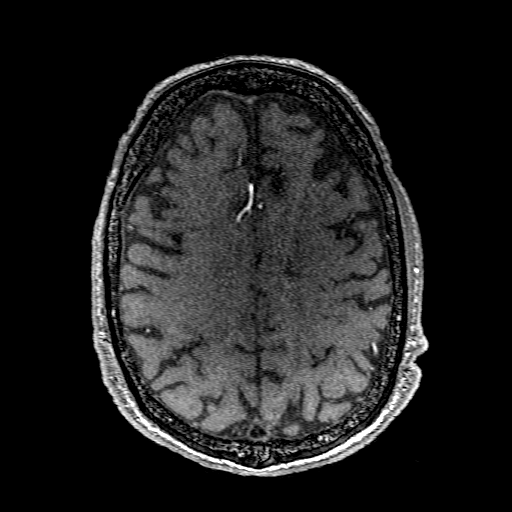

[Series 201: pjn:ax (id) · sagittal · 1.0mm · 0.43mm/px · 2 of 6 slices shown]
[im 1/6]
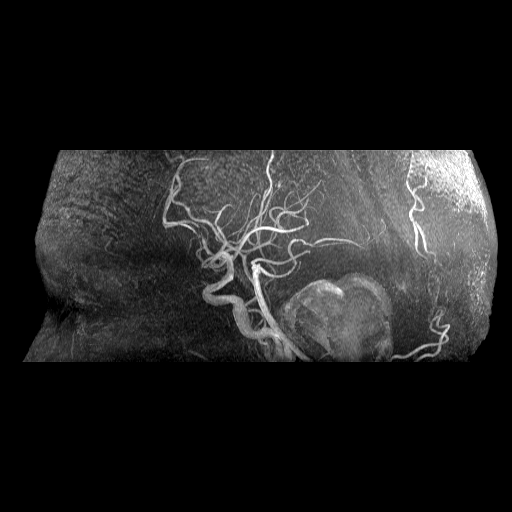
[im 6/6]
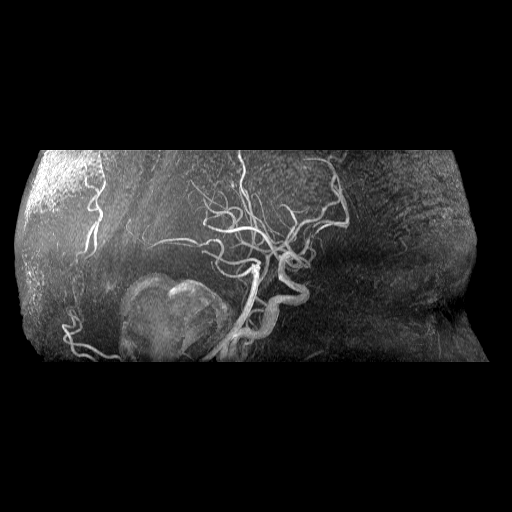

[17 of 48 positions shown; findings below may reference images not displayed]

FINDINGS: MRI HEAD FINDINGS

Brain: Restricted diffusion with ADC correlates in the left frontal
lobe cortex and white matter (series 2, images 34-45 and series 3,
images 16-28). These areas are associated with increased T2 signal.
No evidence of acute hemorrhage, mass, mass effect, or midline
shift. Hydrocephalus or extra-axial collection.

Confluent T2 hyperintense signal in the periventricular white
matter, likely the sequela of severe chronic small vessel ischemic
disease. Lacunar infarcts in the left thalamus, basal ganglia, and
corona radiata. Degree of cerebral atrophy is advanced for age, with
ex vacuo dilatation of the ventricles.

Vascular: Normal flow voids.

Skull and upper cervical spine: Normal marrow signal.

Sinuses/Orbits: Mucous retention cyst in the left sphenoid sinus.
Air-fluid level in the right sphenoid sinus. Mild mucosal thickening
in the ethmoid air cells. Status post bilateral lens replacements.

Other: Fluid in the right-greater-than-left mastoid air cells.

MRA HEAD FINDINGS

Anterior circulation: Both internal carotid arteries are patent to
the termini, without significant stenosis.

A1 segments patent. Normal anterior communicating artery. Mild
multifocal narrowing in the mid to distal left A2 and A3 segments
(series 2, image 112, 129, 156). Anterior cerebral arteries are
otherwise patent to their distal aspects.

No right M1 stenosis or occlusion. Mild narrowing in the mid left M1
(series 2, image 91). Normal MCA bifurcations. Distal MCA branches
perfused and symmetric.

Posterior circulation: Vertebral arteries patent to the
vertebrobasilar junction without stenosis. Posterior inferior
cerebral arteries patent bilaterally.

Basilar patent to its distal aspect. Superior cerebellar arteries
patent bilaterally.

Patent P1 segments. Moderate to severe narrowing in the distal left
P3 (series 2, image 98). PCAs otherwise perfused to their distal
aspects without stenosis. The bilateral posterior communicating
arteries are not visualized.

Anatomic variants: None significant

MRA NECK FINDINGS

Common, internal, and external carotid arteries are patent, without
hemodynamically significant stenosis. Extracranial vertebral
arteries are patent, without hemodynamically significant stenosis.
IMPRESSION: 1. Acute and/or subacute infarcts in the left frontal lobe cortex
and white matter. No evidence of hemorrhagic conversion, mass
effect, or midline shift.
2. No intracranial large vessel occlusion. Moderate to severe
narrowing of the distal left P3 mild narrowing in the mid left M1,
and mild multifocal narrowing in the mid to distal left A2 and A3
segments.
3.  No hemodynamically significant stenosis in the neck.

These results will be called to the ordering clinician or
representative by the Radiologist Assistant, and communication
documented in the PACS or [REDACTED].

## 2021-11-15 IMAGING — DX DG HIP (WITH OR WITHOUT PELVIS) 1V PORT*R*
3 series · 3 of 3 positions shown · non-contrast
Comparison: Hip radiograph [DATE]

CLINICAL DATA: difficulty bearing weight/walking

EXAM:
DG HIP (WITH OR WITHOUT PELVIS) 1V PORT RIGHT

[pelvis ap]
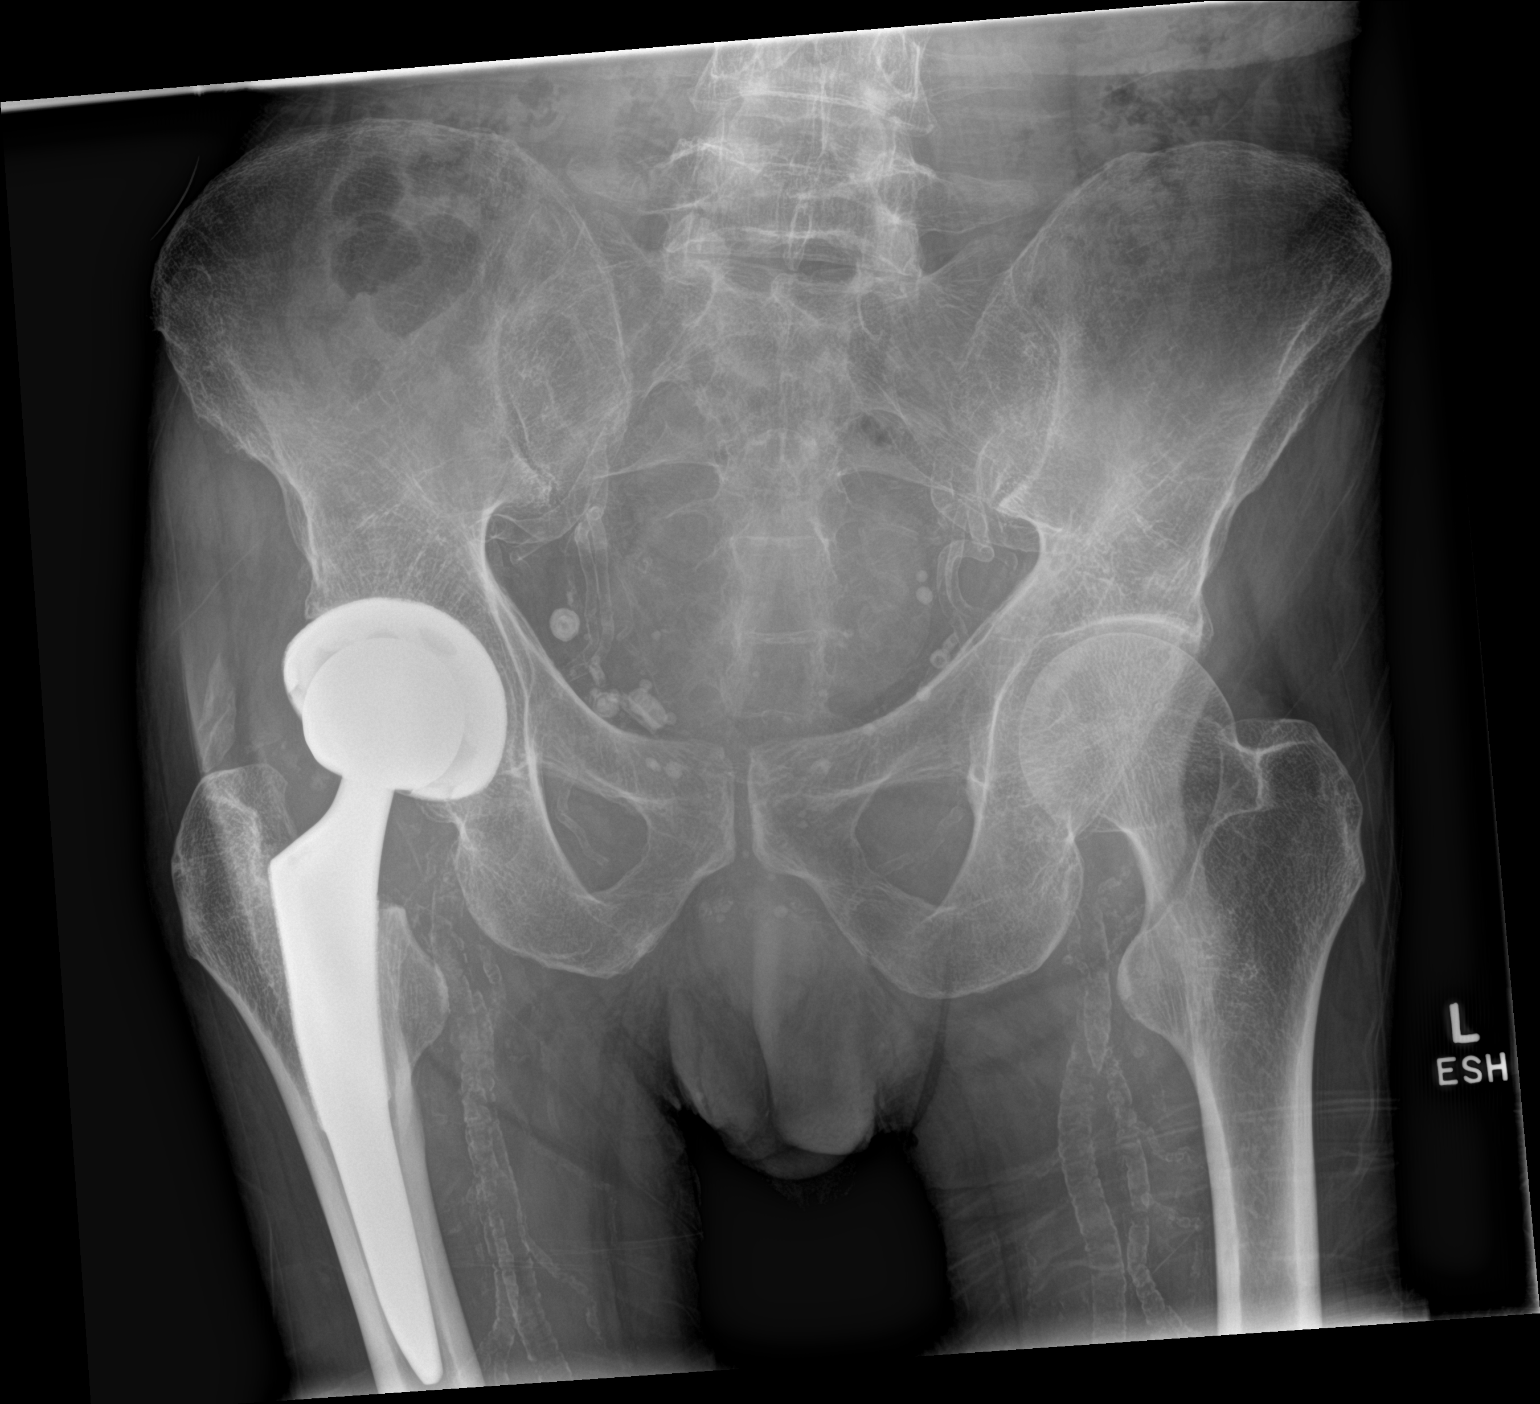

[hip ap]
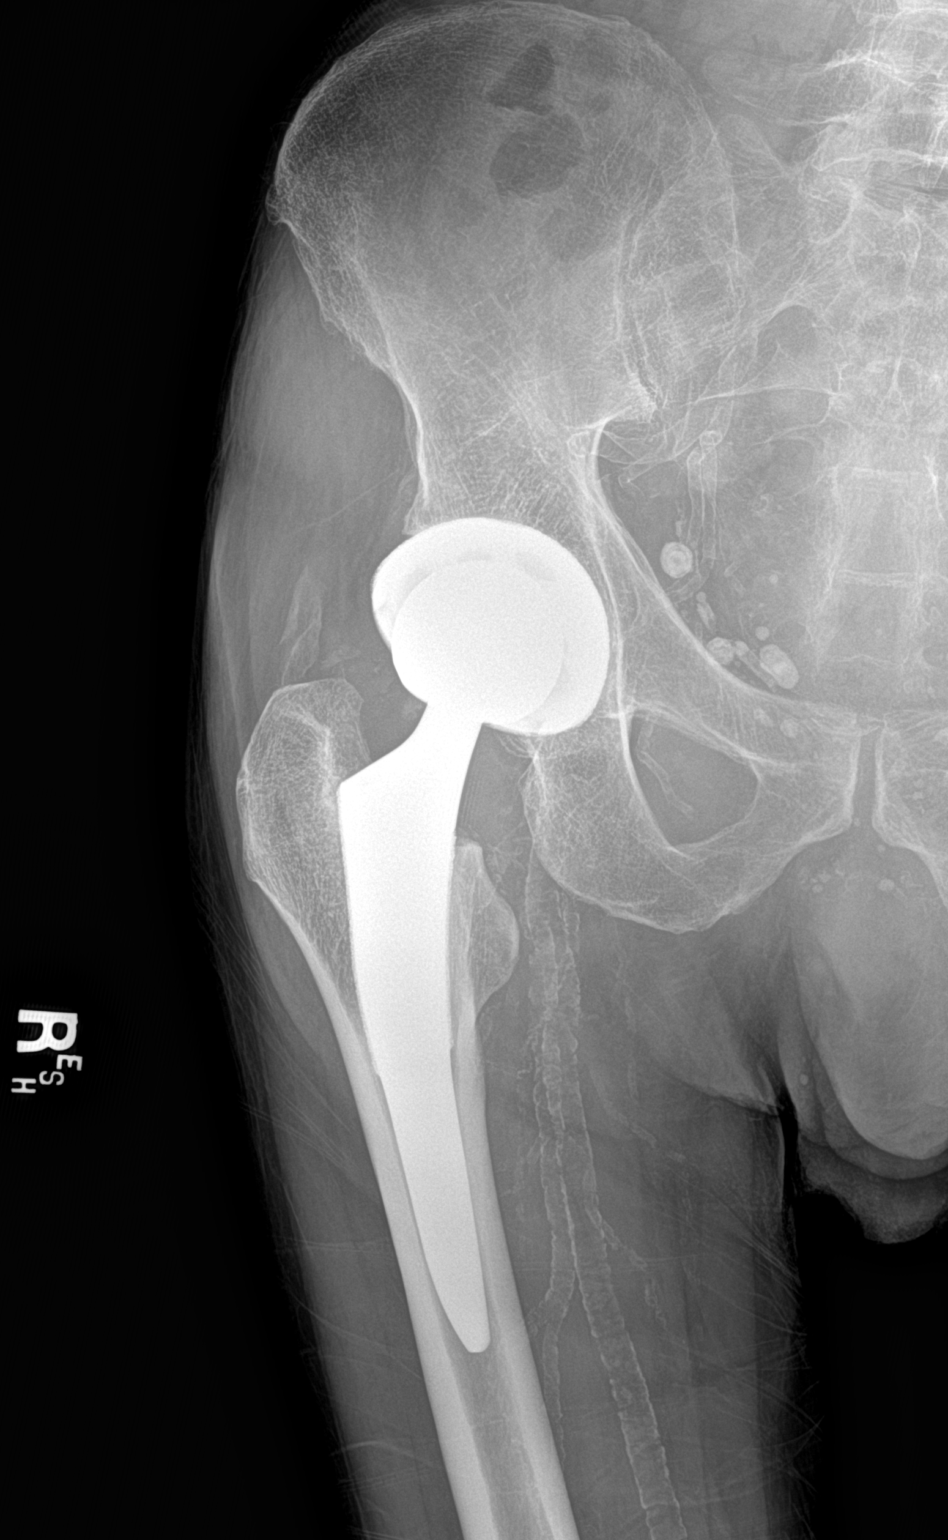

[hip frog leg]
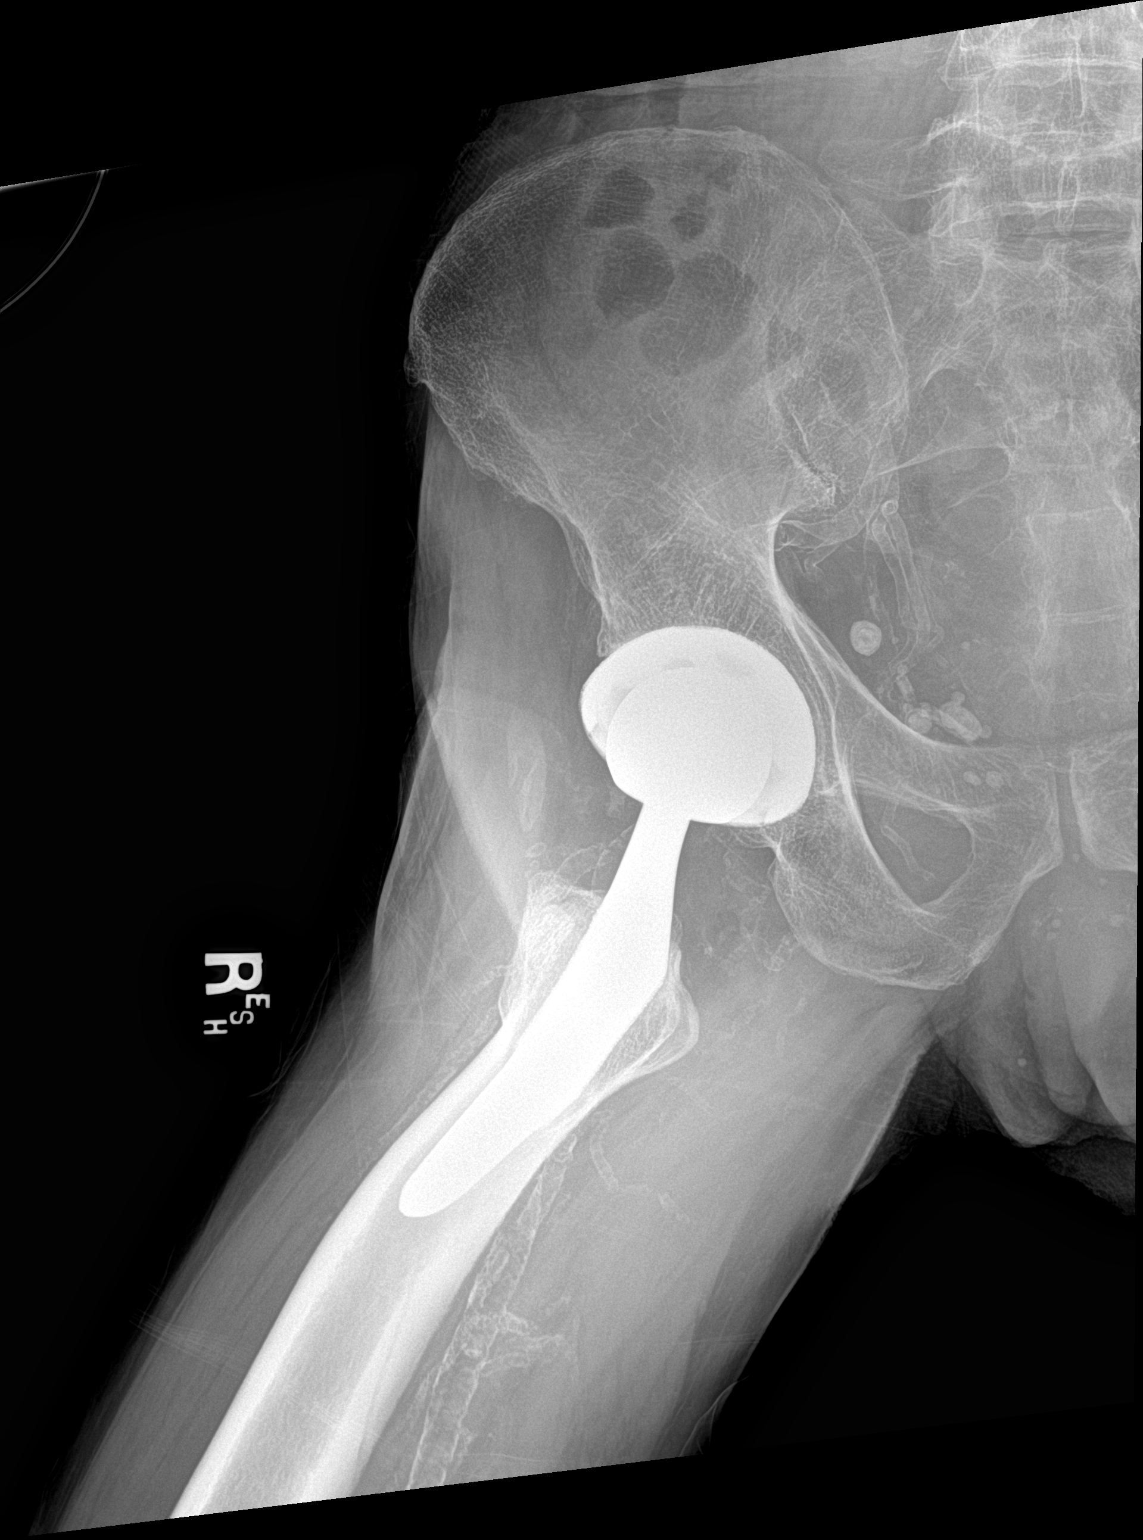

[3 of 3 positions shown; findings below may reference images not displayed]

FINDINGS: There is a right hip arthroplasty in normal alignment without
evidence of loosening or periprosthetic fracture. There are
extensive vascular calcifications. Are OKUJAVA ossification along the
right hip which is non bridging. Pelvic phleboliths noted. No
significant left hip arthritis.
IMPRESSION: No evidence of right hip arthroplasty complication.

## 2021-11-15 IMAGING — MR MR MRA NECK W/O CM
1 of 3 series · 17 of 48 positions shown · non-contrast
Comparison: No prior MRI, correlation is made with CT and CTA head
neck [DATE]

CLINICAL DATA: Stroke, follow-up, right-sided arm and leg weakness

EXAM:
MRI HEAD WITHOUT CONTRAST
MRA HEAD WITHOUT CONTRAST
MRA NECK WITHOUT  CONTRAST
TECHNIQUE: Multiplanar, multi-echo pulse sequences of the brain and surrounding
structures were acquired without intravenous contrast. Angiographic
images of the Circle of Willis were acquired using MRA technique
without intravenous contrast. Angiographic images of the neck were
acquired using MRA technique without intravenous contrast. Carotid
stenosis measurements (when applicable) are obtained utilizing
NASCET criteria, using the distal internal carotid diameter as the
denominator.

[Series 4: sag inhance (id) · sagittal · 1.2mm · 0.47mm/px · 17 of 360 slices shown]
[im 1/360]
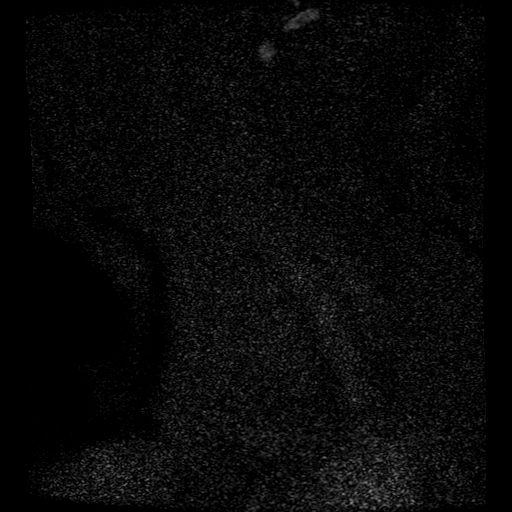
[im 12/360]
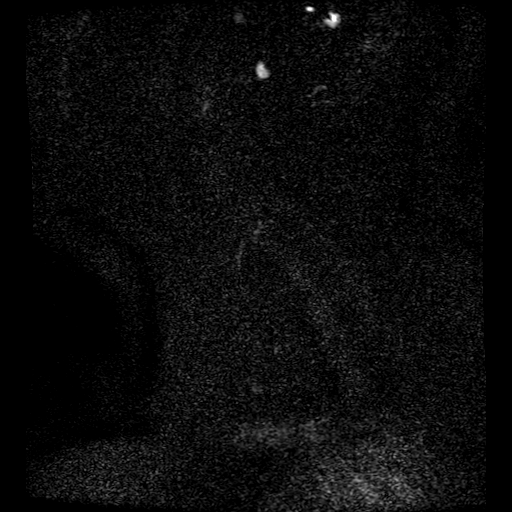
[im 23/360]
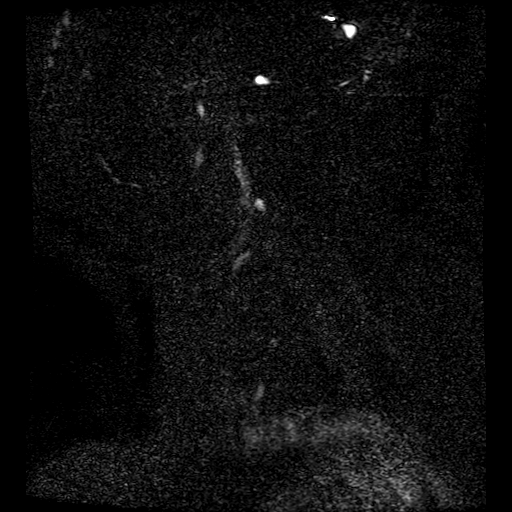
[im 34/360]
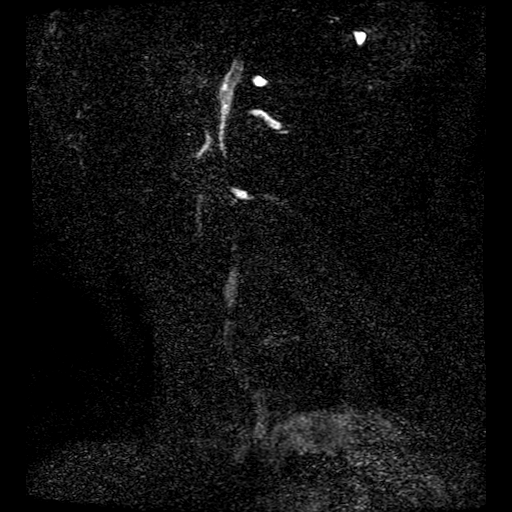
[im 45/360]
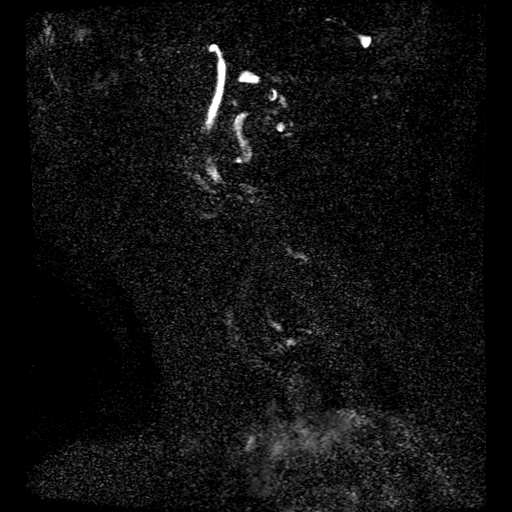
[im 57/360]
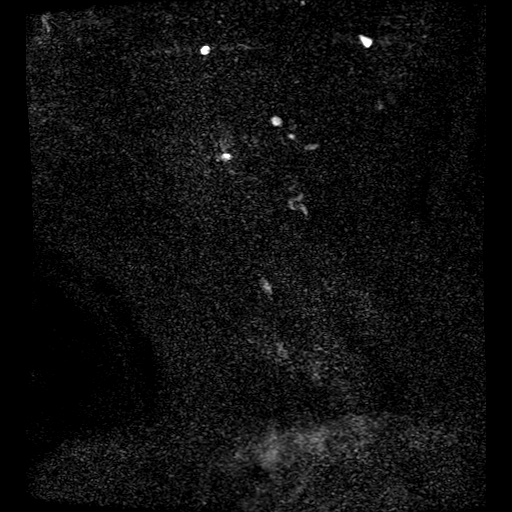
[im 68/360]
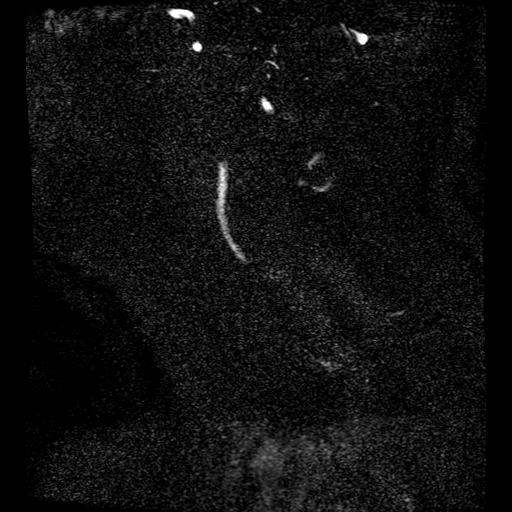
[im 79/360]
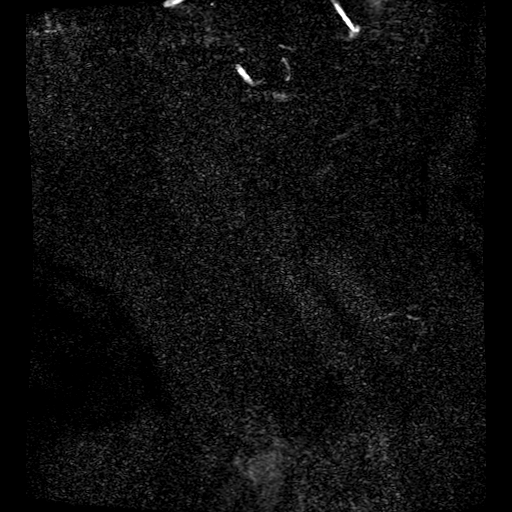
[im 90/360]
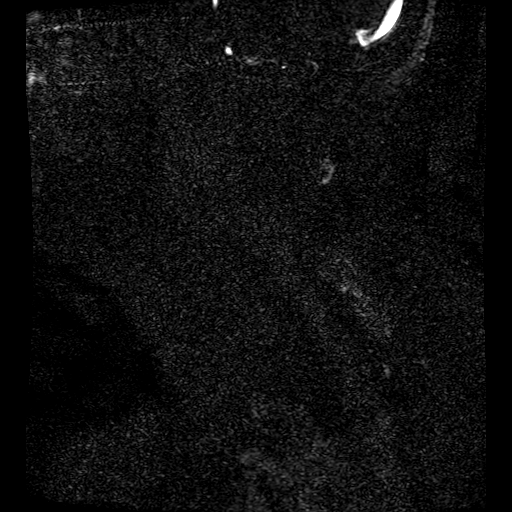
[im 113/360]
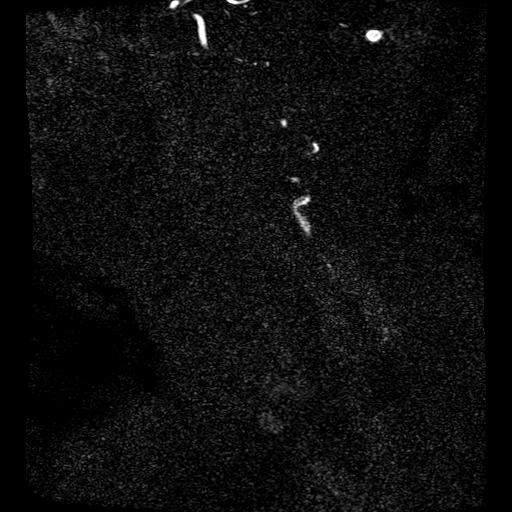
[im 158/360]
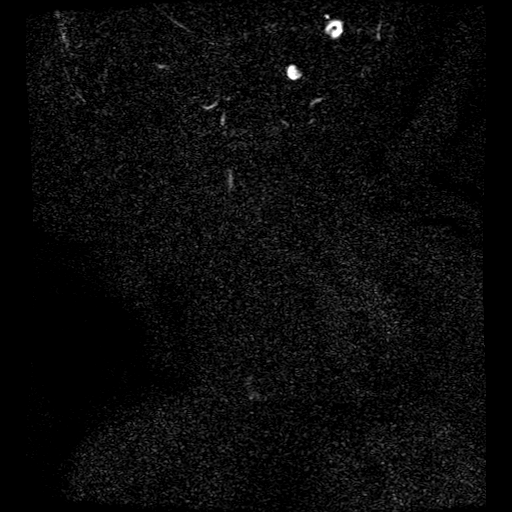
[im 180/360]
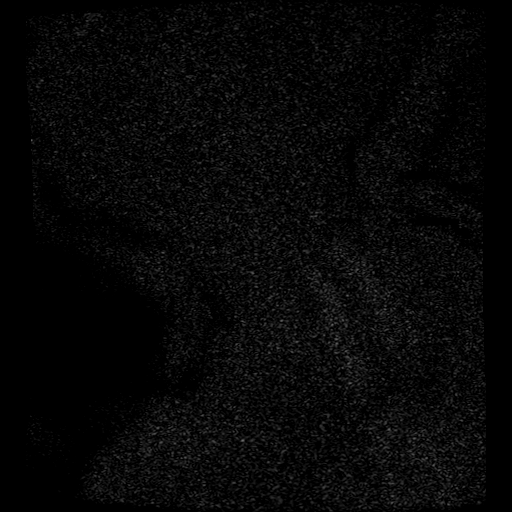
[im 202/360]
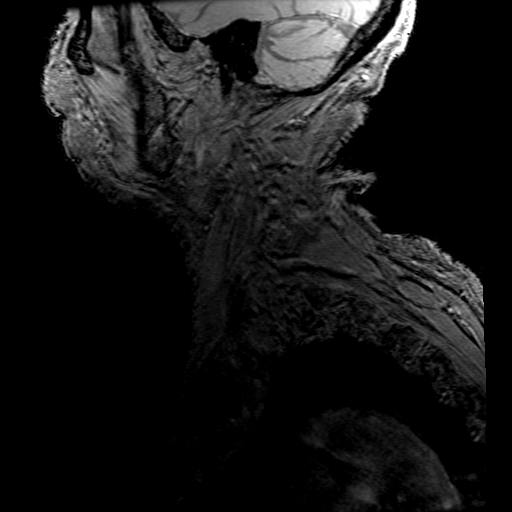
[im 247/360]
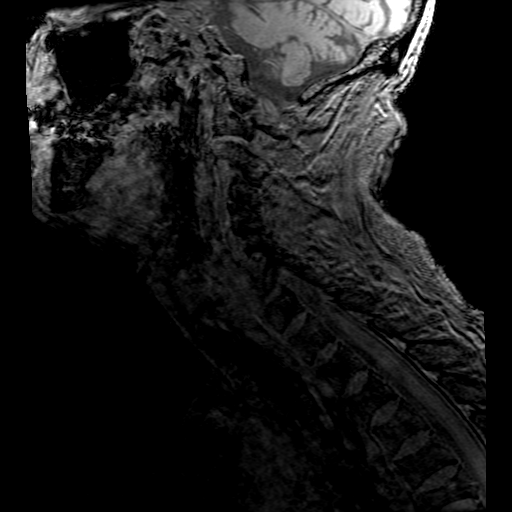
[im 292/360]
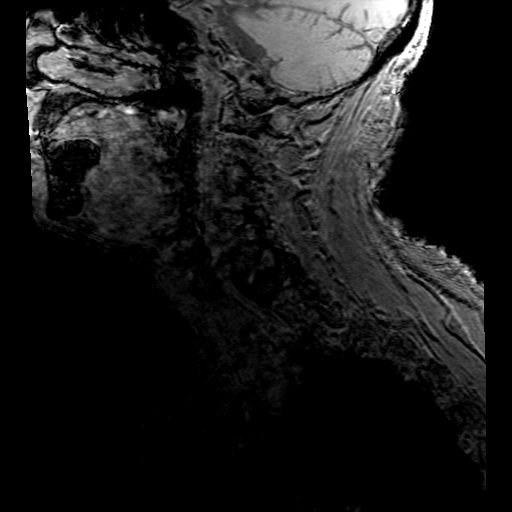
[im 303/360]
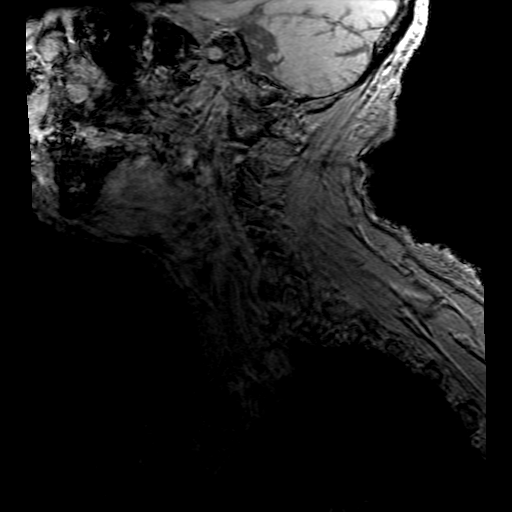
[im 337/360]
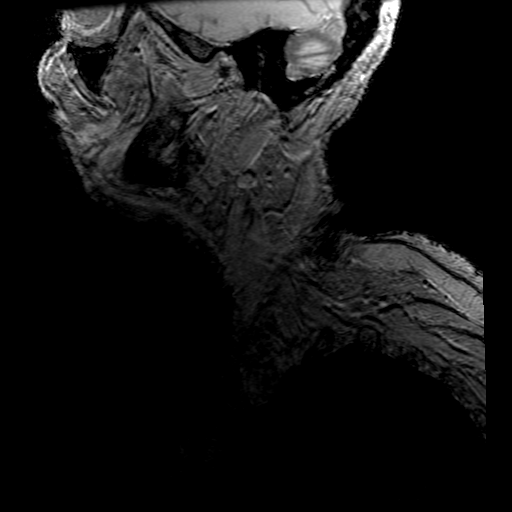

[17 of 48 positions shown; findings below may reference images not displayed]

FINDINGS: MRI HEAD FINDINGS

Brain: Restricted diffusion with ADC correlates in the left frontal
lobe cortex and white matter (series 2, images 34-45 and series 3,
images 16-28). These areas are associated with increased T2 signal.
No evidence of acute hemorrhage, mass, mass effect, or midline
shift. Hydrocephalus or extra-axial collection.

Confluent T2 hyperintense signal in the periventricular white
matter, likely the sequela of severe chronic small vessel ischemic
disease. Lacunar infarcts in the left thalamus, basal ganglia, and
corona radiata. Degree of cerebral atrophy is advanced for age, with
ex vacuo dilatation of the ventricles.

Vascular: Normal flow voids.

Skull and upper cervical spine: Normal marrow signal.

Sinuses/Orbits: Mucous retention cyst in the left sphenoid sinus.
Air-fluid level in the right sphenoid sinus. Mild mucosal thickening
in the ethmoid air cells. Status post bilateral lens replacements.

Other: Fluid in the right-greater-than-left mastoid air cells.

MRA HEAD FINDINGS

Anterior circulation: Both internal carotid arteries are patent to
the termini, without significant stenosis.

A1 segments patent. Normal anterior communicating artery. Mild
multifocal narrowing in the mid to distal left A2 and A3 segments
(series 2, image 112, 129, 156). Anterior cerebral arteries are
otherwise patent to their distal aspects.

No right M1 stenosis or occlusion. Mild narrowing in the mid left M1
(series 2, image 91). Normal MCA bifurcations. Distal MCA branches
perfused and symmetric.

Posterior circulation: Vertebral arteries patent to the
vertebrobasilar junction without stenosis. Posterior inferior
cerebral arteries patent bilaterally.

Basilar patent to its distal aspect. Superior cerebellar arteries
patent bilaterally.

Patent P1 segments. Moderate to severe narrowing in the distal left
P3 (series 2, image 98). PCAs otherwise perfused to their distal
aspects without stenosis. The bilateral posterior communicating
arteries are not visualized.

Anatomic variants: None significant

MRA NECK FINDINGS

Common, internal, and external carotid arteries are patent, without
hemodynamically significant stenosis. Extracranial vertebral
arteries are patent, without hemodynamically significant stenosis.
IMPRESSION: 1. Acute and/or subacute infarcts in the left frontal lobe cortex
and white matter. No evidence of hemorrhagic conversion, mass
effect, or midline shift.
2. No intracranial large vessel occlusion. Moderate to severe
narrowing of the distal left P3 mild narrowing in the mid left M1,
and mild multifocal narrowing in the mid to distal left A2 and A3
segments.
3.  No hemodynamically significant stenosis in the neck.

These results will be called to the ordering clinician or
representative by the Radiologist Assistant, and communication
documented in the PACS or [REDACTED].

## 2021-11-15 MED ORDER — SODIUM CHLORIDE (PF) 0.9 % IJ SOLN
INTRAMUSCULAR | Status: AC
  Filled 2021-11-15: qty 50

## 2021-11-15 MED ORDER — STROKE: EARLY STAGES OF RECOVERY BOOK
Freq: Once | Status: AC
  Filled 2021-11-15: qty 1

## 2021-11-15 MED ORDER — ACETAMINOPHEN 160 MG/5ML PO SOLN: 650.0000 mg | ORAL | Status: DC | PRN

## 2021-11-15 MED ORDER — ESCITALOPRAM OXALATE 10 MG PO TABS
20.0000 mg | ORAL_TABLET | Freq: Every day | ORAL | Status: DC
  Administered 2021-11-16 – 2021-11-26 (×11): 20 mg via ORAL
  Filled 2021-11-15 (×11): qty 2

## 2021-11-15 MED ORDER — ASPIRIN 325 MG PO TABS
325.0000 mg | ORAL_TABLET | Freq: Every day | ORAL | Status: DC
  Administered 2021-11-15: 325 mg via ORAL
  Filled 2021-11-15: qty 1

## 2021-11-15 MED ORDER — CLOPIDOGREL BISULFATE 75 MG PO TABS
75.0000 mg | ORAL_TABLET | Freq: Every day | ORAL | Status: DC
  Administered 2021-11-15 – 2021-11-26 (×12): 75 mg via ORAL
  Filled 2021-11-15 (×12): qty 1

## 2021-11-15 MED ORDER — SODIUM CHLORIDE 0.9 % IV BOLUS
1000.0000 mL | Freq: Once | INTRAVENOUS | Status: AC
  Administered 2021-11-15: 1000 mL via INTRAVENOUS

## 2021-11-15 MED ORDER — ASPIRIN 300 MG RE SUPP: 300.0000 mg | Freq: Every day | RECTAL | Status: DC

## 2021-11-15 MED ORDER — ACETAMINOPHEN 650 MG RE SUPP: 650.0000 mg | RECTAL | Status: DC | PRN

## 2021-11-15 MED ORDER — ASPIRIN EC 81 MG PO TBEC: 81.0000 mg | DELAYED_RELEASE_TABLET | Freq: Every day | ORAL | Status: DC

## 2021-11-15 MED ORDER — LEVOTHYROXINE SODIUM 50 MCG PO TABS
50.0000 ug | ORAL_TABLET | Freq: Every day | ORAL | Status: DC
  Administered 2021-11-16 – 2021-11-26 (×11): 50 ug via ORAL
  Filled 2021-11-15 (×11): qty 1

## 2021-11-15 MED ORDER — ACETAMINOPHEN 325 MG PO TABS: 650.0000 mg | ORAL_TABLET | ORAL | Status: DC | PRN

## 2021-11-15 MED ORDER — IOHEXOL 350 MG/ML SOLN
75.0000 mL | Freq: Once | INTRAVENOUS | Status: AC | PRN
  Administered 2021-11-15: 75 mL via INTRAVENOUS

## 2021-11-15 NOTE — ED Provider Notes (Signed)
?[facility]-EMERGENCY DEPT ?Provider Note ? ? ?[name]: [id number] ?Arrival date & time: 11/15/21  1037 ? ?  ? ?History ? ?Chief Complaint  ?Patient presents with  ? Weakness  ? ? ?Ralph Jackson is a 76 y.o. 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  Patient is a poor historian, supplemental his provided by his daughter and granddaughter who lives with him.  They report the patient was last known well 5 days ago on Saturday.  They state that since then the patient has been refusing to get out of bed, complaining of weakness, but seems specifically to be weak in his right leg and his right arm.  Today when they were trying to feed him his boost, they felt that there was liquid dribbling out of the right side of his mouth.  Patient has also been refusing to eat or drink much the past several days.  He is having loose bowel movements daily.  He is not on blood thinners at home per their recollection.  He has not had any alcohol in a long time ? ?HPI ? ?  ? ?Home Medications ?Prior to Admission medications   ?Medication Sig Start Date End Date Taking? Authorizing Provider  ?aspirin (ASPIRIN CHILDRENS) 81 MG chewable tablet Chew 1 tablet (81 mg total) by mouth 2 (two) times daily. 06/22/21 06/22/22  Darrick GrinderMcCauley, Larry B, PA-C  ?escitalopram (LEXAPRO) 20 MG tablet Take 20 mg by mouth daily. 05/19/21   [provider]  ?feeding supplement (ENSURE ENLIVE / ENSURE PLUS) LIQD Take 237 mLs by mouth 2 (two) times daily between meals. 06/28/21   Calvert Cantorizwan, Saima, MD  ?HYDROcodone-acetaminophen (NORCO/VICODIN) 5-325 MG tablet Take 1-2 tablets by mouth every 4 (four) hours as needed for moderate pain. 06/22/21   Darrick GrinderMcCauley, Larry B, PA-C  ?levothyroxine (SYNTHROID) 50 MCG tablet Take 50 mcg by mouth daily. 05/21/21   [provider]  ?loratadine (CLARITIN) 10 MG  tablet Take 1 tablet (10 mg total) by mouth daily. 12/12/20   Wieters, Hallie C, PA-C  ?metoprolol tartrate (LOPRESSOR) 25 MG tablet Take 25 mg by mouth 2 (two) times daily.  03/01/15   [provider]  ?Multiple Vitamins-Minerals (ONE-A-DAY MENS 50+) TABS Take 1 tablet by mouth daily.    [provider]  ?Nutritional Supplements (,FEEDING SUPPLEMENT, PROSOURCE PLUS) liquid Take 30 mLs by mouth 2 (two) times daily between meals. 06/28/21   Calvert Cantorizwan, Saima, MD  ?omeprazole (PRILOSEC) 20 MG capsule Take 20 mg by mouth daily. 05/21/21   [provider]  ?polyethylene glycol (MIRALAX / GLYCOLAX) 17 g packet Take 17 g by mouth daily as needed for mild constipation. 06/28/21   Calvert Cantorizwan, Saima, MD  ?pravastatin (PRAVACHOL) 10 MG tablet Take 10 mg by mouth at bedtime. 11/26/15   [provider]  ?PROLIA 60 MG/ML SOSY injection Inject 60 mg into the skin See admin instructions. Every 6 months 06/12/21   [provider]  ?senna (SENOKOT) 8.6 MG TABS tablet Take 1 tablet (8.6 mg total) by mouth 2 (two) times daily. 06/28/21   Calvert Cantorizwan, Saima, MD  ?   ? ?Allergies    ?Patient has no known allergies.   ? ?Review of Systems   ?Review of Systems ? ?Physical Exam ?Updated Vital Signs ?BP (!) 150/77 (BP Location: Right Arm)   Pulse (!) 57   Temp 98.9 ?F (37.2 ?  C) (Oral)   Resp 18   SpO2 100%  ?Physical Exam ?Constitutional:   ?   General: He is not in acute distress. ?   Comments: Small, thin, chronically malnourished appearing  ?HENT:  ?   Head: Normocephalic and atraumatic.  ?Eyes:  ?   Conjunctiva/sclera: Conjunctivae normal.  ?   Pupils: Pupils are equal, round, and reactive to light.  ?Cardiovascular:  ?   Rate and Rhythm: Normal rate and regular rhythm.  ?Pulmonary:  ?   Effort: Pulmonary effort is normal. No respiratory distress.  ?Abdominal:  ?   General: There is no distension.  ?   Tenderness: There is no abdominal tenderness.  ?Skin: ?   General: Skin is warm and dry.  ?Neurological:   ?   General: No focal deficit present.  ?   Mental Status: He is alert. Mental status is at baseline.  ?   Comments: 4-5 strength in the right upper and right lower extremity, no sensory deficits  ?Psychiatric:     ?   Mood and Affect: Mood normal.     ?   Behavior: Behavior normal.  ? ? ?ED Results / Procedures / Treatments   ?Labs ?(all labs ordered are listed, but only abnormal results are displayed) ?Labs Reviewed - No data to display ? ?EKG ?None ? ?Radiology ?No results found. ? ?Procedures ?Procedures  ? ? ?Medications Ordered in ED ?Medications - No data to display ? ?ED Course/ Medical Decision Making/ A&P ?Clinical Course as of 11/15/21 1721  ?Thu Nov 15, 2021  ?1317 Paged neurology [MT]  ?1319 Dr Amada Jupiter neurology agrees with medical admission, requested CTA, MRI brain, they will consult on pt's arrival at [facility]. [MT]  ?1339 Admitted to hospitalist [MT]  ?  ?Clinical Course User Index ?[MT] Terald Sleeper, MD  ? ?                        ?Medical Decision Making ?Amount and/or Complexity of Data Reviewed ?Labs: ordered. ?Radiology: ordered. ?ECG/medicine tests: ordered. ? ?Risk ?Decision regarding hospitalization. ? ? ?This patient presents to the ED with concern for generalized weakness, failure to thrive.  This involves an extensive number of treatment options, and is a complaint that carries with it a high risk of complications and morbidity.  The differential diagnosis includes failure to thrive versus infection versus CVA versus COVID-19 versus other ? ?Co-morbidities that complicate the patient evaluation: Alcohol-related dementia, patient is a poor historian ? ?Additional history obtained from patient's daughter and granddaughter at bedside ? ?External records from outside source obtained and reviewed including hospital discharge summary from October 2022, showing hospitalization for dehydration, total right hip arthroplasty performed for right femoral neck fracture, and subsequent  complications of acute respiratory failure with volume overload felt to be secondary to IV fluids at that time.  EF showed severe left ventricular hypertrophy with grade 1 diastolic dysfunction and EF of 60 to 65%. ? ?I ordered and personally interpreted labs.  The pertinent results include:  CMP, CBC, trop wnl.  Doubt acute anemia, dehydration, atypical ACS ? ?I ordered imaging studies including x-rays of the chest, hip, CT scan of the ?I independently visualized and interpreted imaging which showed left cortical infarcts ?I agree with the radiologist interpretation ? ?The patient was maintained on a cardiac monitor.  I personally viewed and interpreted the cardiac monitored which showed an underlying rhythm of: NSR ? ?Per my interpretation the patient's ECG shows NSR, no  acute ischemia ? ?I ordered medication including single fluid bolus for suspected dehydration, will begin with 1 L of fluid, judicious fluids otherwise given hx of volume overload on last hospitalization  ? ?Test Considered:  ?- Doubt PNA, PE, did not feel CT PE indicated ? ?I requested consultation with the neurology,  and discussed lab and imaging findings as well as pertinent plan - they recommend: see ed course ? ?After the interventions noted above, I reevaluated the patient and found that they have: stayed the same ? ? ?Dispostion: ? ?After consideration of the diagnostic results and the patients response to treatment, I feel that the patent would benefit from medical admission. ? ? ? ? ? ? ? ? ?Final Clinical Impression(s) / ED Diagnoses ?CVA ?Right sided weakness ? ? ?Rx / DC Orders ?ED Discharge Orders   ? ? None  ? ?  ? ? ?  ?Terald Sleeper, MD ?11/15/21 1723 ? ?

## 2021-11-15 NOTE — Progress Notes (Signed)
Patient arrived to room 3W30 in NAD, VS stable and patient free from pain. Patient oriented to room and call bell in reach. Family notified. ?

## 2021-11-15 NOTE — Consult Note (Signed)
NEUROLOGY CONSULTATION NOTE  ? ?Date of service: November 15, 2021 ?Patient Name: Ralph Jackson ?MRN:  [id number] ?DOB:  September 08, 1945 ?Reason for consult: "stroke" ?Requesting Provider: Teddy Spike, DO ? ?History of Present Illness  ?Ralph Jackson is a 76 y.o. male with a past medical history of alcohol use disorder, reported dementia, and right hip arthroplasty on 06/2021. ? ?Per documented reports from daughter and granddaughter, the patient presented due to an episode of right facial droop that was noted during feeding as well as increased weakness and poor appetite.  Per the patient's report, he came in due to right-sided weakness, stating that he was unable to walk starting on 3/12.  On assessment, the patient is fully alert and oriented with intact attention.  Review of systems is notable for recent weight loss but otherwise is negative as below. ? ?Of note, after his right hip arthroplasty, the patient was noted to be able to walk 120 feet with a walker when working with PT. ?  ?ROS  ? ?Constitutional Denies  fever and chills.   ?HEENT Denies changes in vision and hearing.   ?Respiratory Denies SOB and cough.   ?CV Denies palpitations and CP   ?GI Denies abdominal pain, nausea, vomiting and diarrhea.   ?GU Denies dysuria and urinary frequency.   ?MSK Denies myalgia and joint pain.   ?Skin Denies rash and pruritus.   ?Neurological Denies headache and syncope.   ?Psychiatric Denies recent changes in mood. Denies anxiety and depression.   ? ?Past History  ? ?Past Medical History:  ?Diagnosis Date  ? Depression   ? ETOH abuse   ? In remission for several years now  ? Hypertension   ? Urinary tract infection   ? ?Past Surgical History:  ?Procedure Laterality Date  ? TOTAL HIP ARTHROPLASTY Right 06/21/2021  ? Procedure: TOTAL HIP ARTHROPLASTY ANTERIOR APPROACH;  Surgeon: Samson Frederic, MD;  Location: [facility] ORS;  Service: Orthopedics;  Laterality: Right;  ? ?Family History  ?Problem Relation Age of Onset  ?  Osteoporosis Neg Hx   ? ?Social History  ? ?Socioeconomic History  ? Marital status: Single  ?  Spouse name: Not on file  ? Number of children: Not on file  ? Years of education: Not on file  ? Highest education level: Not on file  ?Occupational History  ? Not on file  ?Tobacco Use  ? Smoking status: Never  ? Smokeless tobacco: Current  ?Substance and Sexual Activity  ? Alcohol use: Not Currently  ?  Alcohol/week: 6.0 standard drinks  ?  Types: 3 Glasses of wine, 3 Cans of beer per week  ?  Comment: Pt states he drinks 3/5 of wine daily  ? Drug use: No  ? Sexual activity: Not Currently  ?Other Topics Concern  ? Not on file  ?Social History Narrative  ? Not on file  ? ?Social Determinants of Health  ? ?Financial Resource Strain: Not on file  ?Food Insecurity: Not on file  ?Transportation Needs: Not on file  ?Physical Activity: Not on file  ?Stress: Not on file  ?Social Connections: Not on file  ? ?No Known Allergies ? ?Medications  ? ?Medications Prior to Admission  ?Medication Sig Dispense Refill Last Dose  ? acetaminophen (TYLENOL) 500 MG tablet Take 1,000 mg by mouth every 6 (six) hours as needed for mild pain.   Past Week  ? escitalopram (LEXAPRO) 20 MG tablet Take 20 mg by mouth daily.   11/15/2021  ? feeding  supplement (ENSURE ENLIVE / ENSURE PLUS) LIQD Take 237 mLs by mouth 2 (two) times daily between meals. 237 mL 12 11/15/2021  ? levothyroxine (SYNTHROID) 50 MCG tablet Take 50 mcg by mouth daily.   11/15/2021  ? loratadine (CLARITIN) 10 MG tablet Take 1 tablet (10 mg total) by mouth daily. (Patient taking differently: Take 10 mg by mouth at bedtime.) 15 tablet 0 11/14/2021  ? metoprolol tartrate (LOPRESSOR) 25 MG tablet Take 12.5 mg by mouth 2 (two) times daily.   11/15/2021 at 0800  ? Multiple Vitamins-Minerals (ONE-A-DAY MENS 50+) TABS Take 1 tablet by mouth daily.   11/15/2021  ? omeprazole (PRILOSEC) 20 MG capsule Take 20 mg by mouth daily.   11/15/2021  ? pravastatin (PRAVACHOL) 10 MG tablet Take 10 mg by  mouth at bedtime.   11/14/2021  ? PROLIA 60 MG/ML SOSY injection Inject 60 mg into the skin See admin instructions. Every 6 months   unk  ? aspirin (ASPIRIN CHILDRENS) 81 MG chewable tablet Chew 1 tablet (81 mg total) by mouth 2 (two) times daily. (Patient not taking: Reported on 11/15/2021) 36 tablet 11 Not Taking  ? HYDROcodone-acetaminophen (NORCO/VICODIN) 5-325 MG tablet Take 1-2 tablets by mouth every 4 (four) hours as needed for moderate pain. (Patient not taking: Reported on 11/15/2021) 30 tablet 0 Not Taking  ? Nutritional Supplements (,FEEDING SUPPLEMENT, PROSOURCE PLUS) liquid Take 30 mLs by mouth 2 (two) times daily between meals. (Patient not taking: Reported on 11/15/2021) 887 mL 10 Not Taking  ? polyethylene glycol (MIRALAX / GLYCOLAX) 17 g packet Take 17 g by mouth daily as needed for mild constipation. (Patient not taking: Reported on 11/15/2021) 14 each 0 Not Taking  ? senna (SENOKOT) 8.6 MG TABS tablet Take 1 tablet (8.6 mg total) by mouth 2 (two) times daily. (Patient not taking: Reported on 11/15/2021) 120 tablet 0 Not Taking  ?  ? ?Vitals  ? ?Vitals:  ? 11/15/21 1145 11/15/21 1259 11/15/21 1343 11/15/21 1620  ?BP: (!) 154/83 (!) 145/90 (!) 157/85 (!) 153/78  ?Pulse: (!) 56 79 63 63  ?Resp: 18 14 18 18   ?Temp:   98.1 ?F (36.7 ?C) 98.2 ?F (36.8 ?C)  ?TempSrc:    Oral  ?SpO2: 99% 96% 100% 100%  ?  ? ?There is no height or weight on file to calculate BMI. ? ?Physical Exam  ? ?General: Lying comfortably in bed; in no acute distress. Cachectic appearing. ?HENT: Normal oropharynx and mucosa. Normal external appearance of ears and nose.  ?Neck: Supple, no pain or tenderness  ?CV: No JVD. No peripheral edema.  ?Pulmonary: Symmetric Chest rise. Normal respiratory effort.  ?Ext: No cyanosis, edema, or deformity  ?Skin: No rash. Normal palpation of skin.   ? ?Neurologic Examination  ?Mental status/Cognition: Alert, oriented to self, place, month and year, good attention.  ?Speech/language: mildly dysarthric,  comprehension intact, object naming intact, repetition intact.  ?Cranial nerves:  ? CN II Pupils equal and reactive to light, no VF deficits   ? CN III,IV,VI EOM intact, no gaze preference or deviation, no nystagmus   ? CN V normal sensation in V1, V2, and V3 segments bilaterally   ? CN VII no asymmetry, no nasolabial fold flattening   ? CN VIII Hard of hearing  ? CN IX & X normal palatal elevation, no uvular deviation   ? CN XI 5/5 head turn and 5/5 shoulder shrug bilaterally   ? CN XII midline tongue protrusion   ? ?Motor:  ?Muscle bulk: markedly  diminished ?Mvmt Root Nerve  Muscle Right Left Comments  ?SA C5/6 Ax Deltoid 4/5 4+/5   ?EF C5/6 Mc Biceps 4/5 4+/5   ?EE C6/7/8 Rad Triceps 4/5 4+/5   ?WF C6/7 Med FCR     ?WE C7/8 PIN ECU     ?F Ab C8/T1 U ADM/FDI     ?HF L1/2/3 Fem Illopsoas 4-/5 4/5   ?KE L2/3/4 Fem Quad 4-/5 4/5   ?DF L4/5 D Peron Tib Ant     ?PF S1/2 Tibial Grc/Sol     ? ?Reflexes: ? Right Left Comments  ?Pectoralis     ? Biceps (C5/6) 2+ 2+   ?Brachioradialis (C5/6) 2+ 2+   ? Triceps (C6/7)     ? Patellar (L3/4) 2+ 2+   ? Achilles (S1)     ? Hoffman     ? Plantar     ?Jaw jerk   ? ?Sensation: ? Light touch intact  ? Pin prick   ? Temperature Diminished in a stocking pattern  ? Vibration   ?Proprioception intact  ? ?Coordination/Complex Motor:  ?- Finger to Nose intact ?- Heel to shin intact ?- Gait: deferred ? ?Labs  ? ?CBC:  ?Recent Labs  ?Lab 11/15/21 ?1131  ?WBC 9.5  ?NEUTROABS 7.0  ?HGB 13.3  ?HCT 39.3  ?MCV 92.0  ?PLT 311  ? ? ?Basic Metabolic Panel:  ?Lab Results  ?Component Value Date  ? NA 139 11/15/2021  ? K 4.3 11/15/2021  ? CO2 29 11/15/2021  ? GLUCOSE 145 (H) 11/15/2021  ? BUN 18 11/15/2021  ? CREATININE 0.96 11/15/2021  ? CALCIUM 9.2 11/15/2021  ? GFRNONAA >60 11/15/2021  ? GFRAA >60 09/13/2019  ? ?Lipid Panel: No results found for: LDLCALC ?HgbA1c: No results found for: HGBA1C ?Urine Drug Screen:  ?   ?Component Value Date/Time  ? LABOPIA NONE DETECTED 12/16/2015 1730  ? COCAINSCRNUR  NONE DETECTED 12/16/2015 1730  ? COCAINSCRNUR NEGATIVE 01/07/2014 1356  ? LABBENZ NONE DETECTED 12/16/2015 1730  ? AMPHETMU NONE DETECTED 12/16/2015 1730  ? THCU NONE DETECTED 12/16/2015 1730  ? LABBARB NONE

## 2021-11-15 NOTE — ED Notes (Signed)
[facility] transportation arranged. May be short delay due to all trucks being on calls. ?

## 2021-11-15 NOTE — ED Triage Notes (Signed)
Pt presents with c/o weakness and not eating well. Pt's family reports that he has been weak and his food has been leaking out of the side of his mouth. Family reports last time they saw him normal was on Saturday.  ?

## 2021-11-15 NOTE — Progress Notes (Signed)
Patient covid test came back positive MD not notified by day nurse.Text paged Dr.Rathore. ?

## 2021-11-15 NOTE — H&P (Signed)
?History and Physical  ? ? ?Patient: Ralph Jackson UKG:[id number] DOB: [birth year] ?DOA: 11/15/2021 ?DOS: the patient was seen and examined on 11/15/2021 ?PCP: [name], PA-C  ?Patient coming from: Home ? ?Chief Complaint:  ?Chief Complaint  ?Patient presents with  ? Weakness  ? ?HPI: Ralph Jackson is a 76 y.o. male with medical history significant of tobacco abuse, HTN, hypothyroidism, HLD, GERD. Presenting with right side weakness. History is from chart review as patient is a poor historian and family is not available at this time. Apparently the patient hasn't been himself in the last 5 days. He has been more weak and he has had poor appetite. He has complained of right arm/leg pain and weakness. This morning, family was attempting to feed him some boost, but it seemed as if he was drooling and couldn't kept the shake in his mouth. They became concerned and brought him to the ED for evaluation.   ? ?Review of Systems: As mentioned in the history of present illness. All other systems reviewed and are negative. ?Past Medical History:  ?Diagnosis Date  ? Depression   ? ETOH abuse   ? In remission for several years now  ? Hypertension   ? Urinary tract infection   ? ?Past Surgical History:  ?Procedure Laterality Date  ? TOTAL HIP ARTHROPLASTY Right 06/21/2021  ? Procedure: TOTAL HIP ARTHROPLASTY ANTERIOR APPROACH;  Surgeon: Samson Frederic, MD;  Location: [facility] ORS;  Service: Orthopedics;  Laterality: Right;  ? ?Social History:  reports that he has never smoked. He uses smokeless tobacco. He reports that he does not currently use alcohol after a past usage of about 6.0 standard drinks per week. He reports that he does not use drugs. ? ?No Known Allergies ? ?Family History  ?Problem Relation Age of Onset  ? Osteoporosis Neg Hx   ? ? ?Prior to Admission medications   ?Medication Sig Start Date End Date Taking? Authorizing Provider  ?acetaminophen (TYLENOL) 500 MG tablet Take 1,000 mg by mouth every 6  (six) hours as needed for mild pain.   Yes [provider]  ?escitalopram (LEXAPRO) 20 MG tablet Take 20 mg by mouth daily. 05/19/21  Yes [provider]  ?feeding supplement (ENSURE ENLIVE / ENSURE PLUS) LIQD Take 237 mLs by mouth 2 (two) times daily between meals. 06/28/21  Yes Calvert Cantor, MD  ?levothyroxine (SYNTHROID) 50 MCG tablet Take 50 mcg by mouth daily. 05/21/21  Yes [provider]  ?loratadine (CLARITIN) 10 MG tablet Take 1 tablet (10 mg total) by mouth daily. ?Patient taking differently: Take 10 mg by mouth at bedtime. 12/12/20  Yes Wieters, Hallie C, PA-C  ?metoprolol tartrate (LOPRESSOR) 25 MG tablet Take 12.5 mg by mouth 2 (two) times daily. 03/01/15  Yes [provider]  ?Multiple Vitamins-Minerals (ONE-A-DAY MENS 50+) TABS Take 1 tablet by mouth daily.   Yes [provider]  ?omeprazole (PRILOSEC) 20 MG capsule Take 20 mg by mouth daily. 05/21/21  Yes [provider]  ?pravastatin (PRAVACHOL) 10 MG tablet Take 10 mg by mouth at bedtime. 11/26/15  Yes [provider]  ?PROLIA 60 MG/ML SOSY injection Inject 60 mg into the skin See admin instructions. Every 6 months 06/12/21  Yes [provider]  ?aspirin (ASPIRIN CHILDRENS) 81 MG chewable tablet Chew 1 tablet (81 mg total) by mouth 2 (two) times daily. ?Patient not taking: Reported on 11/15/2021 06/22/21 06/22/22  Darrick Grinder, PA-C  ?HYDROcodone-acetaminophen (NORCO/VICODIN) 5-325 MG tablet Take 1-2 tablets by  mouth every 4 (four) hours as needed for moderate pain. ?Patient not taking: Reported on 11/15/2021 06/22/21   Darrick Grinder, PA-C  ?Nutritional Supplements (,FEEDING SUPPLEMENT, PROSOURCE PLUS) liquid Take 30 mLs by mouth 2 (two) times daily between meals. ?Patient not taking: Reported on 11/15/2021 06/28/21   Calvert Cantor, MD  ?polyethylene glycol (MIRALAX / GLYCOLAX) 17 g packet Take 17 g by mouth daily as needed for mild constipation. ?Patient not taking: Reported  on 11/15/2021 06/28/21   Calvert Cantor, MD  ?senna (SENOKOT) 8.6 MG TABS tablet Take 1 tablet (8.6 mg total) by mouth 2 (two) times daily. ?Patient not taking: Reported on 11/15/2021 06/28/21   Calvert Cantor, MD  ? ? ?Physical Exam: ?Vitals:  ? 11/15/21 1044 11/15/21 1145 11/15/21 1259 11/15/21 1343  ?BP: (!) 150/77 (!) 154/83 (!) 145/90 (!) 157/85  ?Pulse: (!) 57 (!) 56 79 63  ?Resp: 18 18 14 18   ?Temp: 98.9 ?F (37.2 ?C)   98.1 ?F (36.7 ?C)  ?TempSrc: Oral     ?SpO2: 100% 99% 96% 100%  ? ?General: 76 y.o. male resting in bed in NAD ?Eyes: PERRL, normal sclera ?ENMT: Nares patent w/o discharge, orophaynx clear, dentition normal, ears w/o discharge/lesions/ulcers ?Neck: Supple, trachea midline ?Cardiovascular: RRR, +S1, S2, no m/g/r, equal pulses throughout ?Respiratory: CTABL, no w/r/r, normal WOB ?GI: BS+, NDNT, no masses noted, no organomegaly noted ?MSK: No e/c/c ?Neuro: A&O x 3, RUE msk str 4/5; RLE msk str 4/5 ?Psyc: Appropriate interaction and affect, calm/cooperative ? ?Data Reviewed: ? ?Glu 145 ? ?CTH: Small cortical/subcortical left frontal infarcts are likely acute to ?subacute. Chronic infarcts and chronic microvascular ischemic changes. ? ?EKG: sinus, no st elevations ? ?Assessment and Plan: ?No notes have been filed under this hospital service. ?Service: Hospitalist ?CVA ?    - admit to inpt, tele @ [facility] ?    - CTH as above ?    - check MRI, CTA H&N, echo ?    - permissive HTN ?    - neurology consulted; will see at [facility], appreciate assistance ?    - NPO until swallow screen ?    - PT/OT consult ?    - TOC consult ?    - A1c, lipid panel ? ?Tobacco abuse ?    - counsel against further use ? ?HTN ?    - permissive HTN for now ? ?Hypothyroidism ?    - resume home regimen when off NPO status ? ?GERD ?    - resume home regimen when off NPO status ? ?HLD ?    - resume home regimen when off NPO status ? ?Alcoholic dementia ?    - he has some cognitive deficiency on exam; has previous history of heavy drinking,  but no longer uses alcohol ? ? ? Advance Care Planning:   Code Status: FULL ? ?Consults: Neurology ? ?Family Communication: None at bedside ? ?Severity of Illness: ?The appropriate patient status for this patient is INPATIENT. Inpatient status is judged to be reasonable and necessary in order to provide the required intensity of service to ensure the patient's safety. The patient's presenting symptoms, physical exam findings, and initial radiographic and laboratory data in the context of their chronic comorbidities is felt to place them at high risk for further clinical deterioration. Furthermore, it is not anticipated that the patient will be medically stable for discharge from the hospital within 2 midnights of admission.  ? ?* I certify that at the point of admission it is  my clinical judgment that the patient will require inpatient hospital care spanning beyond 2 midnights from the point of admission due to high intensity of service, high risk for further deterioration and high frequency of surveillance required.* ? ?Author: ?Teddy Spikeyrone A Ishan Sanroman, DO ?11/15/2021 1:58 PM ? ?For on call review [email or web address].  ?

## 2021-11-16 ENCOUNTER — Inpatient Hospital Stay (HOSPITAL_COMMUNITY): Payer: Medicare Other

## 2021-11-16 DIAGNOSIS — I6389 Other cerebral infarction: Secondary | ICD-10-CM

## 2021-11-16 DIAGNOSIS — I63522 Cerebral infarction due to unspecified occlusion or stenosis of left anterior cerebral artery: Secondary | ICD-10-CM | POA: Diagnosis not present

## 2021-11-16 DIAGNOSIS — E785 Hyperlipidemia, unspecified: Secondary | ICD-10-CM | POA: Diagnosis not present

## 2021-11-16 DIAGNOSIS — I10 Essential (primary) hypertension: Secondary | ICD-10-CM

## 2021-11-16 DIAGNOSIS — F1011 Alcohol abuse, in remission: Secondary | ICD-10-CM

## 2021-11-16 DIAGNOSIS — U071 COVID-19: Secondary | ICD-10-CM

## 2021-11-16 DIAGNOSIS — K219 Gastro-esophageal reflux disease without esophagitis: Secondary | ICD-10-CM

## 2021-11-16 DIAGNOSIS — F1097 Alcohol use, unspecified with alcohol-induced persisting dementia: Secondary | ICD-10-CM | POA: Diagnosis not present

## 2021-11-16 DIAGNOSIS — E43 Unspecified severe protein-calorie malnutrition: Secondary | ICD-10-CM

## 2021-11-16 DIAGNOSIS — I639 Cerebral infarction, unspecified: Secondary | ICD-10-CM | POA: Diagnosis not present

## 2021-11-16 DIAGNOSIS — Z720 Tobacco use: Secondary | ICD-10-CM

## 2021-11-16 DIAGNOSIS — E039 Hypothyroidism, unspecified: Secondary | ICD-10-CM

## 2021-11-16 LAB — ECHOCARDIOGRAM LIMITED
AR max vel: 2.43 cm2
AV Area VTI: 2.45 cm2
AV Area mean vel: 2.41 cm2
AV Mean grad: 5 mmHg
AV Peak grad: 8.5 mmHg
Ao pk vel: 1.46 m/s
Area-P 1/2: 3.24 cm2
S' Lateral: 2.4 cm

## 2021-11-16 LAB — HEMOGLOBIN A1C
Hgb A1c MFr Bld: 5 % (ref 4.8–5.6)
Mean Plasma Glucose: 96.8 mg/dL

## 2021-11-16 LAB — LIPID PANEL
Cholesterol: 153 mg/dL (ref 0–200)
HDL: 55 mg/dL (ref >40)
LDL Cholesterol: 90 mg/dL (ref 0–99)
Total CHOL/HDL Ratio: 2.8 RATIO
Triglycerides: 38 mg/dL (ref <150)
VLDL: 8 mg/dL (ref 0–40)

## 2021-11-16 LAB — TSH: TSH: 2.708 u[IU]/mL (ref 0.350–4.500)

## 2021-11-16 MED ORDER — ASPIRIN EC 325 MG PO TBEC
325.0000 mg | DELAYED_RELEASE_TABLET | Freq: Every day | ORAL | Status: DC
  Administered 2021-11-17 – 2021-11-26 (×10): 325 mg via ORAL
  Filled 2021-11-16 (×10): qty 1

## 2021-11-16 MED ORDER — ENOXAPARIN SODIUM 30 MG/0.3ML IJ SOSY
30.0000 mg | PREFILLED_SYRINGE | INTRAMUSCULAR | Status: DC
  Administered 2021-11-16 – 2021-11-25 (×10): 30 mg via SUBCUTANEOUS
  Filled 2021-11-16 (×10): qty 0.3

## 2021-11-16 MED ORDER — METOPROLOL TARTRATE 12.5 MG HALF TABLET
12.5000 mg | ORAL_TABLET | Freq: Two times a day (BID) | ORAL | Status: DC
  Administered 2021-11-16 – 2021-11-18 (×3): 12.5 mg via ORAL
  Filled 2021-11-16 (×4): qty 1

## 2021-11-16 MED ORDER — ROSUVASTATIN CALCIUM 20 MG PO TABS
20.0000 mg | ORAL_TABLET | Freq: Every day | ORAL | Status: DC
  Administered 2021-11-16 – 2021-11-26 (×11): 20 mg via ORAL
  Filled 2021-11-16 (×11): qty 1

## 2021-11-16 MED ORDER — ASPIRIN EC 325 MG PO TBEC
325.0000 mg | DELAYED_RELEASE_TABLET | Freq: Every day | ORAL | Status: DC
  Administered 2021-11-16: 325 mg via ORAL
  Filled 2021-11-16: qty 1

## 2021-11-16 MED ORDER — NIRMATRELVIR/RITONAVIR (PAXLOVID)TABLET
3.0000 | ORAL_TABLET | Freq: Two times a day (BID) | ORAL | Status: AC
Start: 2021-11-16 — End: 2021-11-20
  Administered 2021-11-16 – 2021-11-20 (×10): 3 via ORAL
  Filled 2021-11-16 (×2): qty 30

## 2021-11-16 MED ORDER — ASPIRIN EC 81 MG PO TBEC
81.0000 mg | DELAYED_RELEASE_TABLET | Freq: Every day | ORAL | Status: DC
Start: 2021-11-17 — End: 2021-11-16

## 2021-11-16 MED ORDER — ADULT MULTIVITAMIN W/MINERALS CH
1.0000 | ORAL_TABLET | Freq: Every day | ORAL | Status: DC
  Administered 2021-11-16 – 2021-11-26 (×11): 1 via ORAL
  Filled 2021-11-16 (×11): qty 1

## 2021-11-16 MED ORDER — PANTOPRAZOLE SODIUM 40 MG PO TBEC
40.0000 mg | DELAYED_RELEASE_TABLET | Freq: Every day | ORAL | Status: DC
Start: 2021-11-16 — End: 2021-11-26
  Administered 2021-11-16 – 2021-11-26 (×11): 40 mg via ORAL
  Filled 2021-11-16 (×11): qty 1

## 2021-11-16 MED ORDER — ENSURE ENLIVE PO LIQD
237.0000 mL | Freq: Two times a day (BID) | ORAL | Status: DC
  Administered 2021-11-18 – 2021-11-24 (×8): 237 mL via ORAL

## 2021-11-16 NOTE — Progress Notes (Signed)
SLP Cancellation Note ? ?Patient Details ?Name: Ralph Jackson ?MRN: [id number] ?DOB: 1946/02/16 ? ? ?Cancelled treatment:       Reason Eval/Treat Not Completed: Other (comment) Pt working with other providers. Will f/u as able.  ? ? ? ?Mahala Menghini., M.A. CCC-SLP ?Acute Rehabilitation Services ?Pager [phone] ?Office [phone] ? ?11/16/2021, 11:45 AM ?

## 2021-11-16 NOTE — Assessment & Plan Note (Addendum)
Patient presenting to the ED with right facial droop, right-sided upper/lower extremity weakness.  CT head without contrast with findings of small cortical/subcortical left frontal infarct likely acute to subacute.  MR brain without contrast with acute/subacute infarcts left frontal lobe cortex and white matter, no evidence of hemorrhagic conversion, mass affect or midline shift.  MRA head/neck with no intracranial large vessel occlusion, moderate to severe narrowing distal left P3 and narrowing in mid left M1 and multifocal narrowing in the mid to distal left A2 and A3 segments, no hemodynamically significant stenosis in the neck.  LDL 90, hemoglobin A1c 5.0.  TTE with LVEF 60 to 65%, mild focal basal septal left ventricular hypertrophy, grade 2 diastolic dysfunction, RV systolic function within normal limits, LA mildly dilated, no aortic stenosis, IVC normal in size, no atrial shunt.  Neurology initially following now signed off, recommending 4-week follow-up outpatient. ?--DAPT with Aspirin 325 mg p.o. daily and Plavix 75 mg p.o. daily x 3 months followed by aspirin alone ?--Crestor 20 mg p.o. daily ?--PT/OT currently recommending CIR ?--Continue monitor on telemetry3 ?--Outpatient follow-up [facility] 4 weeks ? ?

## 2021-11-16 NOTE — Evaluation (Signed)
Occupational Therapy Evaluation ?Patient Details ?Name: Ralph Jackson ?MRN: [id number] ?DOB: 1945-09-23 ?Today's Date: 11/16/2021 ? ? ?History of Present Illness Pt is 76yo male who presented to ED with R UE/LE weakness. 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 PMH: tobacco abuse, HTN, hypthyroidism, HLD< GERD, dementia, and ETOH abuse PSH: R THA 10/22.  ? ?Clinical Impression ?  ?Pt admitted for concerns listed above. PTA pt reported that he was independent with ADL's and functional mobility, using no AD. At this time, pt requiring max A +1-2 for sit<>stands and ADL's. He is weaker on the R side, however motor planning appears WFL. Recommending AIR to maximize his independence and safety, as well as reduce caregiver burden. OT will follow acutely.  ?   ? ?Recommendations for follow up therapy are one component of a multi-disciplinary discharge planning process, led by the attending physician.  Recommendations may be updated based on patient status, additional functional criteria and insurance authorization.  ? ?Follow Up Recommendations ? Acute inpatient rehab (3hours/day)  ?  ?Assistance Recommended at Discharge Frequent or constant Supervision/Assistance  ?Patient can return home with the following Assistance with cooking/housework;Direct supervision/assist for medications management;Direct supervision/assist for financial management;Two people to help with walking and/or transfers;A lot of help with bathing/dressing/bathroom;Assist for transportation;Help with stairs or ramp for entrance ? ?  ?Functional Status Assessment ? Patient has had a recent decline in their functional status and demonstrates the ability to make significant improvements in function in a reasonable and predictable amount of time.  ?Equipment Recommendations ? Other (comment) (TBD)  ?  ?Recommendations for Other Services Rehab consult ? ? ?  ?Precautions / Restrictions Precautions ?Precautions: Fall ?Precaution Comments:  COVID+ ?Restrictions ?Weight Bearing Restrictions: No  ? ?  ? ?Mobility Bed Mobility ?  ?  ?  ?  ?  ?  ?  ?General bed mobility comments: Pt up in recliner on entry ?  ? ?Transfers ?Overall transfer level: Needs assistance ?Equipment used: Rolling walker (2 wheels) ?Transfers: Sit to/from Stand ?Sit to Stand: Mod assist, +2 physical assistance ?  ?  ?  ?  ?  ?General transfer comment: pt with signficiant retropulsion, trunk flexion, fear of falling, able to correct with verbal cuing and physical support ?  ? ?  ?Balance Overall balance assessment: Needs assistance ?Sitting-balance support: No upper extremity supported, Feet supported ?Sitting balance-Leahy Scale: Fair ?Sitting balance - Comments: Edge of chair, able to hold balance, not against challenge ?  ?Standing balance support: During functional activity, Bilateral upper extremity supported ?Standing balance-Leahy Scale: Poor ?Standing balance comment: Able to shift weight some with support, requires cuing to lean forward out of retropulsion ?  ?  ?  ?  ?  ?  ?  ?  ?  ?  ?  ?   ? ?ADL either performed or assessed with clinical judgement  ? ?ADL Overall ADL's : Needs assistance/impaired ?Eating/Feeding: Set up;Sitting ?  ?Grooming: Set up;Sitting ?  ?Upper Body Bathing: Minimal assistance;Sitting ?  ?Lower Body Bathing: Maximal assistance;Sitting/lateral leans;Sit to/from stand ?  ?Upper Body Dressing : Minimal assistance;Sitting ?  ?Lower Body Dressing: Maximal assistance;Sitting/lateral leans;Sit to/from stand ?  ?Toilet Transfer: Maximal assistance;+2 for safety/equipment;Stand-pivot ?  ?Toileting- Clothing Manipulation and Hygiene: Maximal assistance;+2 for safety/equipment;Sitting/lateral lean;Sit to/from stand ?  ?  ?  ?Functional mobility during ADLs: Maximal assistance;Rolling walker (2 wheels) ?General ADL Comments: Pt with poor balance, decreased activity tolerance, and weakness  ? ? ? ?Vision Baseline Vision/History: 1 Wears glasses ?Ability  to See  in Adequate Light: 0 Adequate ?Patient Visual Report: No change from baseline ?Vision Assessment?: Vision impaired- to be further tested in functional context ?Additional Comments: All assessment WFL, however will benefit from further functional assessment  ?   ?Perception   ?  ?Praxis   ?  ? ?Pertinent Vitals/Pain Pain Assessment ?Pain Assessment: No/denies pain  ? ? ? ?Hand Dominance Right ?  ?Extremity/Trunk Assessment Upper Extremity Assessment ?Upper Extremity Assessment: RUE deficits/detail ?RUE Deficits / Details: generalized weakness compared to L ?RUE Sensation: decreased light touch ?RUE Coordination: decreased fine motor ?  ?Lower Extremity Assessment ?Lower Extremity Assessment: Defer to PT evaluation ?  ?Cervical / Trunk Assessment ?Cervical / Trunk Assessment: Kyphotic ?  ?Communication Communication ?Communication: No difficulties ?  ?Cognition Arousal/Alertness: Awake/alert ?Behavior During Therapy: Flat affect ?Overall Cognitive Status: No family/caregiver present to determine baseline cognitive functioning ?  ?  ?  ?  ?  ?  ?  ?  ?  ?  ?  ?  ?  ?  ?  ?  ?General Comments: pt oriented to hospital, able to say date with cues, delayed processing, increased fear of falling, anxious with OOB mobiltiy ?  ?  ?General Comments  VSS on RA ? ?  ?Exercises   ?  ?Shoulder Instructions    ? ? ?Home Living Family/patient expects to be discharged to:: Private residence ?Living Arrangements: Children (dtr and son-in-law) ?Available Help at Discharge: Family;Available 24 hours/day (dtr works from home, husband is retired) ?Type of Home: House ?Home Access: Stairs to enter ?Entrance Stairs-Number of Steps: 4 ?Entrance Stairs-Rails: Left;Right ?Home Layout: Two level ?Alternate Level Stairs-Number of Steps: flight ?Alternate Level Stairs-Rails: Right ?Bathroom Shower/Tub: Tub/shower unit ?  ?Bathroom Toilet: Standard ?Bathroom Accessibility: Yes ?How Accessible: Accessible via walker ?Home Equipment: Conservation officer, nature  (2 wheels);Wheelchair - manual;Tub bench ?  ?  ?  ? ?  ?Prior Functioning/Environment Prior Level of Function : Independent/Modified Independent ?  ?  ?  ?  ?  ?  ?Mobility Comments: pt reports not use any AD, doesn't drive, per daughter stairs was a struggle ?ADLs Comments: reports independence ?  ? ?  ?  ?OT Problem List: Decreased strength;Decreased range of motion;Decreased activity tolerance;Impaired balance (sitting and/or standing);Decreased coordination;Impaired vision/perception;Decreased cognition;Decreased safety awareness;Decreased knowledge of use of DME or AE;Impaired UE functional use ?  ?   ?OT Treatment/Interventions: Therapeutic exercise;Self-care/ADL training;Neuromuscular education;Energy conservation;DME and/or AE instruction;Therapeutic activities;Cognitive remediation/compensation;Visual/perceptual remediation/compensation;Patient/family education;Balance training  ?  ?OT Goals(Current goals can be found in the care plan section) Acute Rehab OT Goals ?Patient Stated Goal: To be able to walk ?OT Goal Formulation: With patient ?Time For Goal Achievement: 11/30/21 ?Potential to Achieve Goals: Good ?ADL Goals ?Pt Will Perform Grooming: with min guard assist;standing ?Pt Will Perform Lower Body Bathing: with min assist;sitting/lateral leans;sit to/from stand ?Pt Will Perform Lower Body Dressing: with min assist;sitting/lateral leans;sit to/from stand ?Pt Will Transfer to Toilet: with min assist;ambulating ?Pt Will Perform Toileting - Clothing Manipulation and hygiene: with min assist;sitting/lateral leans;sit to/from stand ?Additional ADL Goal #1: Pt will complete bed mobility with supervision as a precursor to seated ADL's.  ?OT Frequency: Min 2X/week ?  ? ?Co-evaluation   ?  ?  ?  ?  ? ?  ?AM-PAC OT "6 Clicks" Daily Activity     ?Outcome Measure Help from another person eating meals?: A Little ?Help from another person taking care of personal grooming?: A Little ?Help from another person  toileting, which includes using  toliet, bedpan, or urinal?: A Lot ?Help from another person bathing (including washing, rinsing, drying)?: A Lot ?Help from another person to put on and taking off regular upper body clot

## 2021-11-16 NOTE — Progress Notes (Signed)
?  Echocardiogram ?2D Echocardiogram has been performed. ? ?Ralph Jackson ?11/16/2021, 8:54 AM ?

## 2021-11-16 NOTE — Progress Notes (Signed)
Inpatient Rehab Admissions Coordinator:   Per therapy recommendations,  patient was screened for CIR candidacy by Kemet Nijjar, MS, CCC-SLP . At this time, Pt. Appears to be a a potential candidate for CIR. I will request   order for rehab consult per protocol for full assessment. Please contact me any with questions.  Pelham Hennick, MS, CCC-SLP Rehab Admissions Coordinator  [phone] (celll) [phone] (office)  

## 2021-11-16 NOTE — Hospital Course (Signed)
Ralph Jackson is a 76 year old male with past medical history significant for anxiety/depression, hypothyroidism, essential hypertension, GERD, hyperlipidemia, prior history of EtOH abuse in remission who initially presented to [facility] ED on 3/16 complaining of right-sided weakness, right-sided facial droop, poor appetite.  When family was assisting him during breakfast, seem he was drooling and could not keep the boost shake in his mouth.  Due to this concern, family brought him to the ED for further evaluation. ? ?In the ED, temperature 98.9 ?F, HR 57, RR 18, BP 150/77, SPO2 100% on room air.  WBC 9.5, hemoglobin 13.3, platelets 311.  Sodium 139, potassium 4.3, chloride 102, CO2 29, glucose 145, BUN 18, creatinine 0.96.  Lipase 36, AST 24, ALT 16, total bilirubin 0.4, ammonia level 19.  Urinalysis unrevealing.  COVID-19 PCR positive.  Influenza A/B PCR negative.  CT head without contrast with small cortical/subcortical left frontal infarcts likely acute to subacute, chronic infarct and chronic microvascular ischemic changes noted.  Neurology was consulted.  TRH consulted for further evaluation and management of acute versus subacute stroke.  Patient was transferred to [facility] for stroke team evaluation. ?

## 2021-11-16 NOTE — Assessment & Plan Note (Addendum)
Nutrition Status: ?Nutrition Problem: Severe Malnutrition ?Etiology: social / environmental circumstances ?Signs/Symptoms: severe muscle depletion, severe fat depletion ?Interventions: MVI, Ensure Enlive (each supplement provides 350kcal and 20 grams of protein), Liberalize Diet ?--Dietitian following, diet liberalized and continue supplementation, encourage increase oral intake  ?

## 2021-11-16 NOTE — Progress Notes (Addendum)
STROKE TEAM PROGRESS NOTE  ?Hospital day # 1 ? ?INTERVAL HISTORY ?His family is not at the bedside. Called and spoke with daughter Victorino DikeJennifer for clarification on ASA compliance and to give update. Victorino DikeJennifer says [name] was not taking aspirin prior to admission because he was taken off prior to his hip surgery and then he was not told to restart at discharge. Has not taken since before 06/2021.   ? ?Patient seen and was alert and oriented x 3. Covid 19 positive and asymptomatic on my exam. Noted to be HOH to my voice with N95 mask making it even more difficult for him to hear/understand me. However able to complete neuro exam.  ? ?Vitals:  ? 11/15/21 2300 11/16/21 0338 11/16/21 0737 11/16/21 1213  ?BP: (!) 107/56 105/63 140/66 (!) 146/68  ?Pulse: (!) 59 60 62 74  ?Resp: 17 18 14 20   ?Temp: 98.2 ?F (36.8 ?C) 97.7 ?F (36.5 ?C) 98.2 ?F (36.8 ?C) 98.9 ?F (37.2 ?C)  ?TempSrc: Oral Oral Oral Oral  ?SpO2: 98% 100% 99% 100%  ? ?CBC:  ?Recent Labs  ?Lab 11/15/21 ?1131  ?WBC 9.5  ?NEUTROABS 7.0  ?HGB 13.3  ?HCT 39.3  ?MCV 92.0  ?PLT 311  ? ?Basic Metabolic Panel:  ?Recent Labs  ?Lab 11/15/21 ?1131  ?NA 139  ?K 4.3  ?CL 102  ?CO2 29  ?GLUCOSE 145*  ?BUN 18  ?CREATININE 0.96  ?CALCIUM 9.2  ?MG 2.1  ? ?Lipid Panel:  ?Recent Labs  ?Lab 11/16/21 ?0508  ?CHOL 153  ?TRIG 38  ?HDL 55  ?CHOLHDL 2.8  ?VLDL 8  ?LDLCALC 90  ? ?HgbA1c:  ?Recent Labs  ?Lab 11/16/21 ?0508  ?HGBA1C 5.0  ? ?Urine Drug Screen: No results for input(s): LABOPIA, COCAINSCRNUR, LABBENZ, AMPHETMU, THCU, LABBARB in the last 168 hours.  ?Alcohol Level No results for input(s): ETH in the last 168 hours. ? ?IMAGING past 24 hours ?CT Angio Head W or Wo Contrast ? ?Result Date: 11/15/2021 ?CLINICAL DATA:  Stroke, follow-up.  Weakness and difficulty eating. EXAM: CT ANGIOGRAPHY HEAD AND NECK TECHNIQUE: Multidetector CT imaging of the head and neck was performed using the standard protocol during bolus administration of intravenous contrast. Multiplanar CT image  reconstructions and MIPs were obtained to evaluate the vascular anatomy. Carotid stenosis measurements (when applicable) are obtained utilizing NASCET criteria, using the distal internal carotid diameter as the denominator. RADIATION DOSE REDUCTION: This exam was performed according to the departmental dose-optimization program which includes automated exposure control, adjustment of the mA and/or kV according to patient size and/or use of iterative reconstruction technique. CONTRAST:  75mL OMNIPAQUE IOHEXOL 350 MG/ML SOLN COMPARISON:  Head CT same day.  Cervical spine CT 12/10/2015 FINDINGS: CTA NECK FINDINGS Aortic arch: Aortic atherosclerosis. Branching pattern is normal without origin stenosis. Right carotid system: Common carotid artery widely patent to the bifurcation. Soft and calcified plaque at the carotid bifurcation and ICA bulb but no stenosis compared to the more distal cervical ICA diameter. Left carotid system: Common carotid artery widely patent to the bifurcation. Minimal calcified plaque at the ICA bulb but no stenosis. Vertebral arteries: Both vertebral artery origins are widely patent. Both vertebral arteries appear normal through the cervical region to the foramen magnum. Skeleton: No significant cervical degenerative disease or other finding. Other neck: No mass or lymphadenopathy. Upper chest: Chronic scarring at the left apex, unchanged since 2017 and therefore benign. Review of the MIP images confirms the above findings CTA HEAD FINDINGS Anterior circulation: Both internal carotid arteries  are patent through the skull base and siphon region. Ordinary siphon atherosclerosis but without stenosis greater than 30%. The anterior and middle cerebral vessels are patent without proximal stenosis, aneurysm or vascular malformation. The patient does show severe serial stenoses within the left anterior cerebral artery. There is also focal stenosis at the origin of the right M2 inferior division.  Posterior circulation: Both vertebral arteries are patent through the foramen magnum to the basilar. No basilar stenosis. Posterior circulation branch vessels show distal vessel atherosclerotic change, including a severe stenosis at the left P2 region. Venous sinuses: Patent and normal. Anatomic variants: None significant. Review of the MIP images confirms the above findings IMPRESSION: Atherosclerotic disease at both carotid bifurcations but no stenosis. Atherosclerotic disease in both carotid siphon regions but no stenosis greater than 30%. 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 Focal stenosis at the proximal right MCA inferior division. Focal stenosis in the left PCA at the P2 level. Electronically Signed   By: Paulina Fusi M.D.   On: 11/15/2021 15:53  ? ?CT Angio Neck W and/or Wo Contrast ? ?Result Date: 11/15/2021 ?CLINICAL DATA:  Stroke, follow-up.  Weakness and difficulty eating. EXAM: CT ANGIOGRAPHY HEAD AND NECK TECHNIQUE: Multidetector CT imaging of the head and neck was performed using the standard protocol during bolus administration of intravenous contrast. Multiplanar CT image reconstructions and MIPs were obtained to evaluate the vascular anatomy. Carotid stenosis measurements (when applicable) are obtained utilizing NASCET criteria, using the distal internal carotid diameter as the denominator. RADIATION DOSE REDUCTION: This exam was performed according to the departmental dose-optimization program which includes automated exposure control, adjustment of the mA and/or kV according to patient size and/or use of iterative reconstruction technique. CONTRAST:  41mL OMNIPAQUE IOHEXOL 350 MG/ML SOLN COMPARISON:  Head CT same day.  Cervical spine CT 12/10/2015 FINDINGS: CTA NECK FINDINGS Aortic arch: Aortic atherosclerosis. Branching pattern is normal without origin stenosis. Right carotid system: Common carotid artery widely patent to the bifurcation. Soft and calcified plaque at the  carotid bifurcation and ICA bulb but no stenosis compared to the more distal cervical ICA diameter. Left carotid system: Common carotid artery widely patent to the bifurcation. Minimal calcified plaque at the ICA bulb but no stenosis. Vertebral arteries: Both vertebral artery origins are widely patent. Both vertebral arteries appear normal through the cervical region to the foramen magnum. Skeleton: No significant cervical degenerative disease or other finding. Other neck: No mass or lymphadenopathy. Upper chest: Chronic scarring at the left apex, unchanged since 2017 and therefore benign. Review of the MIP images confirms the above findings CTA HEAD FINDINGS Anterior circulation: Both internal carotid arteries are patent through the skull base and siphon region. Ordinary siphon atherosclerosis but without stenosis greater than 30%. The anterior and middle cerebral vessels are patent without proximal stenosis, aneurysm or vascular malformation. The patient does show severe serial stenoses within the left anterior cerebral artery. There is also focal stenosis at the origin of the right M2 inferior division. Posterior circulation: Both vertebral arteries are patent through the foramen magnum to the basilar. No basilar stenosis. Posterior circulation branch vessels show distal vessel atherosclerotic change, including a severe stenosis at the left P2 region. Venous sinuses: Patent and normal. Anatomic variants: None significant. Review of the MIP images confirms the above findings IMPRESSION: Atherosclerotic disease at both carotid bifurcations but no stenosis. Atherosclerotic disease in both carotid siphon regions but no stenosis greater than 30%. 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 Focal stenosis  at the proximal right MCA inferior division. Focal stenosis in the left PCA at the P2 level. Electronically Signed   By: Paulina Fusi M.D.   On: 11/15/2021 15:53  ? ?MR ANGIO HEAD WO  CONTRAST ? ?Result Date: 11/15/2021 ?CLINICAL DATA:  Stroke, follow-up, right-sided arm and leg weakness EXAM: MRI HEAD WITHOUT CONTRAST MRA HEAD WITHOUT CONTRAST MRA NECK WITHOUT  CONTRAST TECHNIQUE: Multiplanar, multi-echo pulse s

## 2021-11-16 NOTE — Assessment & Plan Note (Signed)
COVID-19 PCR positive on admission.  Chest x-ray unrevealing.  Patient reports vaccination in the past.  Asymptomatic. ?--Paxlovid BID x 5 days ?--Airborne/contact isolation x10 days from 3/16 ?--Supportive care ?

## 2021-11-16 NOTE — Progress Notes (Signed)
Inpatient Rehab Admissions Coordinator:  ? ?Consult received.  Note covid positive on 3/16.  Patients are eligible to be considered for admit to the [facility] when cleared from airborne precautions by Acute MD, or Infectious Disease MD, regardless of onset day.  Will follow from a distance for progress with therapy for now.   ? ?Estill Dooms, PT, DPT ?Admissions Coordinator ?[phone] ?11/16/21  ?11:56 AM ? ?

## 2021-11-16 NOTE — Assessment & Plan Note (Addendum)
TSH: 2.708, within normal limits ?--Continue levothyroxine 50 mcg p.o. daily ?

## 2021-11-16 NOTE — Assessment & Plan Note (Addendum)
Currently alert and oriented.  Continue to encourage cessation from alcohol use. ?--Delirium precautions ?--Get up during the day ?--Encourage a familiar face to remain present throughout the day ?--Keep blinds open and lights on during daylight hours ?--Minimize the use of opioids/benzodiazepines ?

## 2021-11-16 NOTE — Assessment & Plan Note (Signed)
Currently in remission.  Continue encourage cessation. ?

## 2021-11-16 NOTE — Progress Notes (Signed)
?  Transition of Care (TOC) Screening Note ? ? ?Patient Details  ?Name: Ralph Jackson ?Date of Birth: Feb 20, 1946 ? ? ?Transition of Care (TOC) CM/SW Contact:    ?Kermit Balo, RN ?Phone Number: ?11/16/2021, 2:31 PM ? ? ? ?Transition of Care Department [facility]) has reviewed patient. We will continue to monitor patient advancement through interdisciplinary progression rounds. If new patient transition needs arise, please place a TOC consult. ?  ?

## 2021-11-16 NOTE — Assessment & Plan Note (Signed)
Lipid panel with total cholesterol 153, HDL 55, LDL 90, triglycerides 38.  On pravastatin 10 mg p.o. daily at home. ?--Pravastatin changed to high intensity Crestor 20 mg p.o. daily ?

## 2021-11-16 NOTE — Progress Notes (Signed)
Initial Nutrition Assessment ? ?DOCUMENTATION CODES:  ? ?Severe malnutrition in context of social or environmental circumstances ? ?INTERVENTION:  ? ?Recommend obtaining new weight. RN notified. ?Multivitamin w/ minerals daily ?Ensure Enlive po BID, each supplement provides 350 kcal and 20 grams of protein. ?Liberalize pt diet to regular due to malnutrition.  ?Make meal ordering w/ assist ?Encourage good PO intake  ? ?NUTRITION DIAGNOSIS:  ? ?Severe Malnutrition related to social / environmental circumstances as evidenced by severe muscle depletion, severe fat depletion. ? ?GOAL:  ? ?Patient will meet greater than or equal to 90% of their needs ? ?MONITOR:  ? ?PO intake, Supplement acceptance, Labs, Weight trends ? ?REASON FOR ASSESSMENT:  ? ?Consult ?Assessment of nutrition requirement/status ? ?ASSESSMENT:  ? ?76 y.o. male presented to the ED with R side weakness. PMH includes HTN, GERD, EtOH abuse in remission, and malnutrition.Pt admitted with a CVA and incidental positive COVID test.  ? ?Pt reports that he has been eating less for the past week. Reports that he typically eats oatmeal for breakfast, a bologna sandwich for lunch, and whatever his daughter/son-in-law make for dinner. Pt reports that he does drink 1 supplement at home per day, unsure of name, calls it his chocolate milk.  ?Pt with no intakes recorded within EMR. Pt reports that he ate well for breakfast and had eggs and sausage.  ? ?Pt endorses recent weight loss, but was unable to provide an amount of weight that he has lost. Pt could not recall his UBW either. Pt reports that he uses a walker to ambulate at home.  ?Pt with no new weight this admission yet. Reached out to RN to obtain new weight.  ? ?Discussed adding ONS here with pt; pt agreeable and prefers chocolate. ? ?Medications reviewed and include: Protonix ?Labs reviewed. ? ?NUTRITION - FOCUSED PHYSICAL EXAM: ? ?Flowsheet Row Most Recent Value  ?Orbital Region Severe depletion  ?Upper Arm  Region Severe depletion  ?Thoracic and Lumbar Region Severe depletion  ?Buccal Region Severe depletion  ?Temple Region Severe depletion  ?Clavicle Bone Region Severe depletion  ?Clavicle and Acromion Bone Region Severe depletion  ?Scapular Bone Region Severe depletion  ?Dorsal Hand Severe depletion  ?Patellar Region Severe depletion  ?Anterior Thigh Region Severe depletion  ?Posterior Calf Region Severe depletion  ?Edema (RD Assessment) None  ?Hair Reviewed  ?Eyes Reviewed  ?Mouth Reviewed  ?Skin Reviewed  ?Nails Reviewed  ? ?  ? ? ?Diet Order:   ?Diet Order   ? ?       ?  Diet regular Room service appropriate? Yes with Assist; Fluid consistency: Thin  Diet effective now       ?  ? ?  ?  ? ?  ? ? ?EDUCATION NEEDS:  ? ?No education needs have been identified at this time ? ?Skin:  Skin Assessment: Reviewed RN Assessment ? ?Last BM:  3/16 ? ?Height:  ? ?Ht Readings from Last 1 Encounters:  ?06/21/21 5\' 1"  (1.549 m)  ? ? ?Weight:  ? ?Wt Readings from Last 1 Encounters:  ?06/28/21 46.6 kg  ? ? ?Ideal Body Weight:  50.9 kg ? ?BMI:  There is no height or weight on file to calculate BMI. ? ?Estimated Nutritional Needs:  ? ?Kcal:  1400-1600 ? ?Protein:  70-85 grams ? ?Fluid:  >/= 1.5 L ? ? ? ?06/30/21 RD, LDN ?Clinical Dietitian ?See AMiON for contact information.  ? ?

## 2021-11-16 NOTE — Assessment & Plan Note (Signed)
Counseled on need for complete cessation. ?

## 2021-11-16 NOTE — Progress Notes (Signed)
?PROGRESS NOTE ? ? ? ?Ralph Jackson  AOZ:[id number] DOB: 1946-03-05 DOA: 11/15/2021 ?PCP: Coralee Ruduran, Michael R, PA-C  ? ? ?Brief Narrative:  ?Ralph Jackson is a 76 year old male with past medical history significant for anxiety/depression, hypothyroidism, essential hypertension, GERD, hyperlipidemia, prior history of EtOH abuse in remission who initially [facility]16 complaining of right-sided weakness, right-sided facial droop, poor appetite.  When family was assisting him during breakfast, seem he was drooling and could not keep the boost shake in his mouth.  Due to this concern, family brought him to the ED for further evaluation. ? ?In the ED, temperature 98.9 ?F, HR 57, RR 18, BP 150/77, SPO2 100% on room air.  WBC 9.5, hemoglobin 13.3, platelets 311.  Sodium 139, potassium 4.3, chloride 102, CO2 29, glucose 145, BUN 18, creatinine 0.96.  Lipase 36, AST 24, ALT 16, total bilirubin 0.4, ammonia level 19.  Urinalysis unrevealing.  COVID-19 PCR positive.  Influenza A/B PCR negative.  CT head without contrast with small cortical/subcortical left frontal infarcts likely acute to subacute, chronic infarct and chronic microvascular ischemic changes noted.  Neurology was consulted.  TRH consulted for further evaluation and management of acute versus subacute stroke.  [facility].  ? ? ? ?Assessment and Plan: ?* CVA (cerebral vascular accident) (HCC) ?Patient presenting to the ED with right facial droop, right-sided upper/lower extremity weakness.  CT head without contrast with findings of small cortical/subcortical left frontal infarct likely acute to subacute.  MR brain without contrast with acute/subacute infarcts left frontal lobe cortex and white matter, no evidence of hemorrhagic conversion, mass affect or midline shift.  MRA head/neck with no intracranial large vessel occlusion, moderate to severe narrowing distal left P3 and  narrowing in mid left M1 and multifocal narrowing in the mid to distal left A2 and A3 segments, no hemodynamically significant stenosis in the neck.  LDL 90, hemoglobin A1c 5.0. ?--Neurology following, appreciate assistance ?--TTE: Pending ?--Aspirin 81 mg p.o. daily ?--Plavix 75 mg p.o. daily ?--Crestor 20 mg p.o. daily ?--PT currently recommending CIR, rehab MD for evaluation ?--Pending OT/SLP evaluation ?--Continue monitor on telemetry ? ? ?COVID-19 virus infection ?COVID-19 PCR positive on admission.  Chest x-ray unrevealing.  Patient reports vaccination in the past.  Asymptomatic. ?--Paxlovid BID x 5 days ?--Airborne/contact isolation x10 days from 3/16 ?--Supportive care ? ?HTN (hypertension) ?Home medications include metoprolol tartrate 12.5 mg p.o. twice daily. ?-- Holding home and hypertensives to allow permissive hypertension in the setting of acute CVA as above ?--Continue to monitor BP closely ? ?Hypothyroidism ?--TSH: Pending ?--Continue levothyroxine 50 mcg p.o. daily ? ?GERD (gastroesophageal reflux disease) ?--Continue PPI ? ?HLD (hyperlipidemia) ?Lipid panel with total cholesterol 153, HDL 55, LDL 90, triglycerides 38.  On pravastatin 10 mg p.o. daily at home. ?--Pravastatin changed to high intensity Crestor 20 mg p.o. daily ? ?Alcoholic dementia (HCC) ?Currently alert and oriented.  Continue to encourage cessation from alcohol use. ?--Delirium precautions ?--Get up during the day ?--Encourage a familiar face to remain present throughout the day ?--Keep blinds open and lights on during daylight hours ?--Minimize the use of opioids/benzodiazepines ? ?Tobacco abuse ?Counseled on need for complete cessation. ? ?Alcohol abuse, in remission ?Currently in remission.  Continue encourage cessation. ? ?Protein-calorie malnutrition, severe (HCC) ?Patient very thin/cachectic in appearance. ?-- Dietitian consult for evaluation and supplementation recommendations. ? ? ? ? ?DVT prophylaxis: SCD's Start: 11/15/21  1602 ? ?  Code Status: Full Code ?  Family Communication: Updated patients daughter, Victorino Dike via telephone this morning ? ?Disposition Plan:  ?Level of care: Telemetry Medical ?Status is: Inpatient ?Remains inpatient appropriate because: Pending further stroke work-up, likely will need CIR versus SNF on discharge ?  ? ?Consultants:  ?Neurology ? ?Procedures:  ?TTE: Pending ? ?Antimicrobials:  ?None ? ? ?Subjective: ?Patient seen examined bedside, resting comfortably.  Currently having echocardiogram performed.  Continues with right-sided weakness.  Discussed findings of acute CVA.  Also discussed that he is COVID-positive but he is asymptomatic; and patient interested in starting treatment with paxlovid.  No family present.  No other questions or concerns at this time.  Denies headache, no visual changes, no fever/chills/night sweats, no nausea/vomiting/diarrhea, no cough/congestion, no chest pain, no palpitations, no shortness of breath, no abdominal pain.  No acute events overnight per nursing staff. ? ?Objective: ?Vitals:  ? 11/15/21 2140 11/15/21 2300 11/16/21 0338 11/16/21 0737  ?BP: 121/70 (!) 107/56 105/63 140/66  ?Pulse: (!) 53 (!) 59 60 62  ?Resp: 20 17 18 14   ?Temp: 98.6 ?F (37 ?C) 98.2 ?F (36.8 ?C) 97.7 ?F (36.5 ?C) 98.2 ?F (36.8 ?C)  ?TempSrc: Oral Oral Oral Oral  ?SpO2: 99% 98% 100% 99%  ? ? ?Intake/Output Summary (Last 24 hours) at 11/16/2021 1141 ?Last data filed at 11/16/2021 [id number] ?Gross per 24 hour  ?Intake --  ?Output 300 ml  ?Net -300 ml  ? ?There were no vitals filed for this visit. ? ?Examination: ? ?Physical Exam: ?GEN: NAD, alert and oriented x 3, thin/cachectic in appearance, chronically ill and appears older than stated age ?HEENT: NCAT, PERRL, EOMI, sclera clear, MMM ?PULM: CTAB w/o wheezes/crackles, normal respiratory effort, on room air ?CV: RRR w/o M/G/R ?GI: abd soft, NTND, NABS, no R/G/M ?MSK: no peripheral edema, muscle strength right upper/lower extremity 4/5, right-sided muscle  strength preserved 5/5 ?NEURO: CN II-XII intact, sensation to light touch intact ?PSYCH: normal mood/affect ?Integumentary: dry/intact, no rashes or wounds ? ? ? ?Data Reviewed: I have personally reviewed following labs and imaging studies ? ?CBC: ?Recent Labs  ?Lab 11/15/21 ?1131  ?WBC 9.5  ?NEUTROABS 7.0  ?HGB 13.3  ?HCT 39.3  ?MCV 92.0  ?PLT 311  ? ?Basic Metabolic Panel: ?Recent Labs  ?Lab 11/15/21 ?1131  ?NA 139  ?K 4.3  ?CL 102  ?CO2 29  ?GLUCOSE 145*  ?BUN 18  ?CREATININE 0.96  ?CALCIUM 9.2  ?MG 2.1  ? ?GFR: ?CrCl cannot be calculated (Unknown ideal weight.). ?Liver Function Tests: ?Recent Labs  ?Lab 11/15/21 ?1131  ?AST 24  ?ALT 16  ?ALKPHOS 65  ?BILITOT 0.4  ?PROT 7.2  ?ALBUMIN 4.1  ? ?Recent Labs  ?Lab 11/15/21 ?1131  ?LIPASE 36  ? ?Recent Labs  ?Lab 11/15/21 ?1131  ?AMMONIA 19  ? ?Coagulation Profile: ?No results for input(s): INR, PROTIME in the last 168 hours. ?Cardiac Enzymes: ?No results for input(s): CKTOTAL, CKMB, CKMBINDEX, TROPONINI in the last 168 hours. ?BNP (last 3 results) ?No results for input(s): PROBNP in the last 8760 hours. ?HbA1C: ?Recent Labs  ?  11/16/21 ?0508  ?HGBA1C 5.0  ? ?CBG: ?No results for input(s): GLUCAP in the last 168 hours. ?Lipid Profile: ?Recent Labs  ?  11/16/21 ?0508  ?CHOL 153  ?HDL 55  ?LDLCALC 90  ?TRIG 38  ?CHOLHDL 2.8  ? ?Thyroid Function Tests: ?No results for input(s): TSH, T4TOTAL, FREET4, T3FREE, THYROIDAB in the last 72 hours. ?Anemia Panel: ?No results for input(s): VITAMINB12, FOLATE, FERRITIN, TIBC, IRON, RETICCTPCT in the last 72 hours. ?Sepsis  Labs: ?No results for input(s): PROCALCITON, LATICACIDVEN in the last 168 hours. ? ?Recent Results (from the past 240 hour(s))  ?Resp Panel by RT-PCR (Flu A&B, Covid) Nasopharyngeal Swab     Status: Abnormal  ? Collection Time: 11/15/21 11:31 AM  ? Specimen: Nasopharyngeal Swab; Nasopharyngeal(NP) swabs in vial transport medium  ?Result Value Ref Range Status  ? SARS Coronavirus 2 by RT PCR POSITIVE (A) NEGATIVE  Final  ?  Comment: (NOTE) ?SARS-CoV-2 target nucleic acids are DETECTED. ? ?The SARS-CoV-2 RNA is generally detectable in upper respiratory ?specimens during the acute phase of infection. Positive results are ?indi

## 2021-11-16 NOTE — Assessment & Plan Note (Signed)
Continue PPI ?

## 2021-11-16 NOTE — Assessment & Plan Note (Addendum)
Home medications include metoprolol tartrate 12.5 mg p.o. twice daily. ?--Discontinued home metoprolol due to asymptomatic bradycardia ?--Continue to monitor BP closely ?

## 2021-11-16 NOTE — Evaluation (Signed)
Physical Therapy Evaluation ?Patient Details ?Name: Ralph Jackson ?MRN: [id number] ?DOB: Feb 08, 1946 ?Today's Date: 11/16/2021 ? ?History of Present Illness ? Pt is 76yo male who presented to ED with R UE/LE weakness. 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 PMH: tobacco abuse, HTN, hypthyroidism, HLD< GERD, dementia, and ETOH abuse PSH: R THA 10/22. ?  ?Clinical Impression ? Pt admitted with above. 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 Pt was amb without AD but did have a hard time with the flight of stairs up to his room but for the most part was function at supervision level. Dtr reports her and and he husband are at home 24/7. Recommend AIR upon d/c for pt to achieve supervision level of function for safe transition back home with dtr as pt is currently at [facility] level with impaired balance, and cognitive deficits. Acute PT to continue to follow.   ?   ? ?Recommendations for follow up therapy are one component of a multi-disciplinary discharge planning process, led by the attending physician.  Recommendations may be updated based on patient status, additional functional criteria and insurance authorization. ? ?Follow Up Recommendations Acute inpatient rehab (3hours/day) ? ?  ?Assistance Recommended at Discharge Frequent or constant Supervision/Assistance  ?Patient can return home with the following ? A lot of help with walking and/or transfers;A lot of help with bathing/dressing/bathroom;Assistance with cooking/housework;Direct supervision/assist for medications management;Direct supervision/assist for financial management;Assist for transportation;Help with stairs or ramp for entrance ? ?  ?Equipment Recommendations  (has DME at home)  ?Recommendations for Other Services ? Rehab consult  ?  ?Functional Status Assessment Patient has had a recent decline in their functional status and/or demonstrates limited ability to make significant  improvements in function in a reasonable and predictable amount of time  ? ?  ?Precautions / Restrictions Precautions ?Precautions: Fall ?Precaution Comments: COVID+ ?Restrictions ?Weight Bearing Restrictions: No  ? ?  ? ?Mobility ? Bed Mobility ?Overal bed mobility: Needs Assistance ?Bed Mobility: Supine to Sit ?  ?  ?Supine to sit: Mod assist ?  ?  ?General bed mobility comments: max directional verbal and tactile cues, pt with good effort to push self up from sidelying, able to bring LEs off EOB ?  ? ?Transfers ?Overall transfer level: Needs assistance ?Equipment used: 2 person hand held assist ?Transfers: Sit to/from Stand, Bed to chair/wheelchair/BSC ?Sit to Stand: Mod assist, Max assist, +2 physical assistance ?  ?Step pivot transfers: Max assist, +2 physical assistance ?  ?  ?  ?General transfer comment: pt with signficiant retropulsion, trunk flexion, fear of falling, unable to get completely upright despite max verbal cues and tactile cues at posterior hips. modA to advance R LE, significant fear of falling ?  ? ?Ambulation/Gait ?  ?  ?  ?  ?  ?  ?  ?General Gait Details: uanble this date ? ?Stairs ?  ?  ?  ?  ?  ? ?Wheelchair Mobility ?  ? ?Modified Rankin (Stroke Patients Only) ?Modified Rankin (Stroke Patients Only) ?Pre-Morbid Rankin Score: Moderate disability ?Modified Rankin: Severe disability ? ?  ? ?Balance Overall balance assessment: Needs assistance ?Sitting-balance support: No upper extremity supported, Feet supported ?Sitting balance-Leahy Scale: Fair ?Sitting balance - Comments: signfician trunk flexion, guarded, posterior lean with LAQ ?  ?Standing balance support: During functional activity, Bilateral upper extremity supported ?Standing balance-Leahy Scale: Zero ?Standing balance comment: significant retropulsion ?  ?  ?  ?  ?  ?  ?  ?  ?  ?  ?  ?   ? ? ? ?  Pertinent Vitals/Pain Pain Assessment ?Pain Assessment: No/denies pain  ? ? ?Home Living Family/patient expects to be discharged to::  Private residence ?Living Arrangements: Children (dtr and son-in-law) ?Available Help at Discharge: Family;Available 24 hours/day (dtr works from home, husband is retired) ?Type of Home: House ?Home Access: Stairs to enter ?Entrance Stairs-Rails: Left;Right ?Entrance Stairs-Number of Steps: 4 ?Alternate Level Stairs-Number of Steps: flight ?Home Layout: Two level ?Home Equipment: Agricultural consultantolling Walker (2 wheels);Wheelchair - manual;Tub bench ?   ?  ?Prior Function Prior Level of Function : Independent/Modified Independent ?  ?  ?  ?  ?  ?  ?Mobility Comments: pt reports not use any AD, doesn't drive, per daughter stairs was a struggle ?ADLs Comments: reports independence ?  ? ? ?Hand Dominance  ? Dominant Hand: Right ? ?  ?Extremity/Trunk Assessment  ? Upper Extremity Assessment ?Upper Extremity Assessment: RUE deficits/detail ?RUE Deficits / Details: generalized weakness compared to L ?  ? ?Lower Extremity Assessment ?Lower Extremity Assessment: RLE deficits/detail ?RLE Deficits / Details: generalized weakness compared to L, hold in slight R hip and knee flexion ?  ? ?Cervical / Trunk Assessment ?Cervical / Trunk Assessment: Kyphotic  ?Communication  ? Communication: No difficulties  ?Cognition Arousal/Alertness: Awake/alert ?Behavior During Therapy: Flat affect ?Overall Cognitive Status: No family/caregiver present to determine baseline cognitive functioning ?  ?  ?  ?  ?  ?  ?  ?  ?  ?  ?  ?  ?  ?  ?  ?  ?General Comments: pt oriented to hospital, able to say date with cues, delayed processing, suspect R vision deficits, increased fear of falling, anxious with OOB mobiltiy ?  ?  ? ?  ?General Comments General comments (skin integrity, edema, etc.): VSS ? ?  ?Exercises    ? ?Assessment/Plan  ?  ?PT Assessment Patient needs continued PT services  ?PT Problem List Decreased strength;Decreased range of motion;Decreased activity tolerance;Decreased balance;Decreased mobility;Decreased coordination;Decreased  cognition;Decreased knowledge of use of DME;Decreased safety awareness;Decreased knowledge of precautions ? ?   ?  ?PT Treatment Interventions DME instruction;Gait training;Stair training;Functional mobility training;Therapeutic activities;Therapeutic exercise;Balance training;Neuromuscular re-education;Cognitive remediation   ? ?PT Goals (Current goals can be found in the Care Plan section)  ?Acute Rehab PT Goals ?Patient Stated Goal: didn't state ?PT Goal Formulation: With patient ?Time For Goal Achievement: 11/30/21 ?Potential to Achieve Goals: Good ? ?  ?Frequency Min 4X/week ?  ? ? ?Co-evaluation   ?  ?  ?  ?  ? ? ?  ?AM-PAC PT "6 Clicks" Mobility  ?Outcome Measure Help needed turning from your back to your side while in a flat bed without using bedrails?: A Little ?Help needed moving from lying on your back to sitting on the side of a flat bed without using bedrails?: A Little ?Help needed moving to and from a bed to a chair (including a wheelchair)?: A Lot ?Help needed standing up from a chair using your arms (e.g., wheelchair or bedside chair)?: A Lot ?Help needed to walk in hospital room?: Total ?Help needed climbing 3-5 steps with a railing? : Total ?6 Click Score: 12 ? ?  ?End of Session Equipment Utilized During Treatment: Gait belt ?Activity Tolerance: Patient tolerated treatment well ?Patient left: in chair;with call bell/phone within reach;with chair alarm set ?Nurse Communication: Mobility status ?PT Visit Diagnosis: Unsteadiness on feet (R26.81);Muscle weakness (generalized) (M62.81);Difficulty in walking, not elsewhere classified (R26.2) ?  ? ?Time: [id number] ?PT Time Calculation (min) (ACUTE ONLY): 25 min ? ? ?Charges:  PT Evaluation ?$PT Eval Moderate Complexity: 1 Mod ?PT Treatments ?$Therapeutic Activity: 8-22 mins ?  ?   ? ? ?Lewis Shock, PT, DPT ?Acute Rehabilitation Services ?Pager #: [phone] ?Office #: [phone] ? ? ?Jye Fariss M Makayla Confer ?11/16/2021, 10:05 AM ? ?

## 2021-11-17 ENCOUNTER — Other Ambulatory Visit: Payer: Self-pay

## 2021-11-17 NOTE — Discharge Instructions (Signed)

## 2021-11-17 NOTE — Progress Notes (Signed)
?PROGRESS NOTE ? ? ? ?Ralph Jackson  UDJ:[id number] DOB: 08/29/1946 DOA: 11/15/2021 ?PCP: [name], PA-C  ? ? ?Brief Narrative:  ?Ralph Jackson is a 76 year old male with past medical history significant for anxiety/depression, hypothyroidism, essential hypertension, GERD, hyperlipidemia, prior history of EtOH abuse in remission who initially presented to [facility] ED on 3/16 complaining of right-sided weakness, right-sided facial droop, poor appetite.  When family was assisting him during breakfast, seem he was drooling and could not keep the boost shake in his mouth.  Due to this concern, family brought him to the ED for further evaluation. ? ?In the ED, temperature 98.9 ?F, HR 57, RR 18, BP 150/77, SPO2 100% on room air.  WBC 9.5, hemoglobin 13.3, platelets 311.  Sodium 139, potassium 4.3, chloride 102, CO2 29, glucose 145, BUN 18, creatinine 0.96.  Lipase 36, AST 24, ALT 16, total bilirubin 0.4, ammonia level 19.  Urinalysis unrevealing.  COVID-19 PCR positive.  Influenza A/B PCR negative.  CT head without contrast with small cortical/subcortical left frontal infarcts likely acute to subacute, chronic infarct and chronic microvascular ischemic changes noted.  Neurology was consulted.  TRH consulted for further evaluation and management of acute versus subacute stroke.  Patient was transferred to [facility] for stroke team evaluation.  ? ? ? ?Assessment and Plan: ?* CVA (cerebral vascular accident) (HCC) ?Patient presenting to the ED with right facial droop, right-sided upper/lower extremity weakness.  CT head without contrast with findings of small cortical/subcortical left frontal infarct likely acute to subacute.  MR brain without contrast with acute/subacute infarcts left frontal lobe cortex and white matter, no evidence of hemorrhagic conversion, mass affect or midline shift.  MRA head/neck with no intracranial large vessel occlusion, moderate to severe narrowing distal left P3 and  narrowing in mid left M1 and multifocal narrowing in the mid to distal left A2 and A3 segments, no hemodynamically significant stenosis in the neck.  LDL 90, hemoglobin A1c 5.0.  TTE with LVEF 60 to 65%, mild focal basal septal left ventricular hypertrophy, grade 2 diastolic dysfunction, RV systolic function within normal limits, LA mildly dilated, no aortic stenosis, IVC normal in size, no atrial shunt. ?--Neurology following, appreciate assistance ?--TTE: Pending ?--DAPT with Aspirin 325 mg p.o. daily and Plavix 75 mg p.o. daily x 3 months followed by aspirin alone ?--Crestor 20 mg p.o. daily ?--PT currently recommending CIR ?--Pending OT/SLP evaluation ?--Continue monitor on telemetry ? ? ?COVID-19 virus infection ?COVID-19 PCR positive on admission.  Chest x-ray unrevealing.  Patient reports vaccination in the past.  Asymptomatic. ?--Paxlovid BID x 5 days ?--Airborne/contact isolation x10 days from 3/16 ?--Supportive care ? ?HTN (hypertension) ?Home medications include metoprolol tartrate 12.5 mg p.o. twice daily. ?--Holding home metoprolol given slight bradycardia ?--Continue to monitor BP closely ? ?Hypothyroidism ?TSH: 2.708, within normal limits ?--Continue levothyroxine 50 mcg p.o. daily ? ?GERD (gastroesophageal reflux disease) ?--Continue PPI ? ?HLD (hyperlipidemia) ?Lipid panel with total cholesterol 153, HDL 55, LDL 90, triglycerides 38.  On pravastatin 10 mg p.o. daily at home. ?--Pravastatin changed to high intensity Crestor 20 mg p.o. daily ? ?Alcoholic dementia (HCC) ?Currently alert and oriented.  Continue to encourage cessation from alcohol use. ?--Delirium precautions ?--Get up during the day ?--Encourage a familiar face to remain present throughout the day ?--Keep blinds open and lights on during daylight hours ?--Minimize the use of opioids/benzodiazepines ? ?Tobacco abuse ?Counseled on need for complete cessation. ? ?Alcohol abuse, in remission ?Currently in remission.  Continue encourage  cessation. ? ?  Protein-calorie malnutrition, severe (HCC) ?Nutrition Status: ?Nutrition Problem: Severe Malnutrition ?Etiology: social / environmental circumstances ?Signs/Symptoms: severe muscle depletion, severe fat depletion ?Interventions: MVI, Ensure Enlive (each supplement provides 350kcal and 20 grams of protein), Liberalize Diet ?--Dietitian following, diet liberalized and continue supplementation, encourage increase oral intake  ? ? ? ? ?DVT prophylaxis: enoxaparin (LOVENOX) injection 30 mg Start: 11/16/21 2200 ?SCD's Start: 11/15/21 1602 ? ?  Code Status: Full Code ?Family Communication: Updated patients daughter, Victorino Dike via telephone this morning ? ?Disposition Plan:  ?Level of care: Telemetry Medical ?Status is: Inpatient ?Remains inpatient appropriate because: Pending further stroke work-up, likely will need CIR versus SNF on discharge ?  ? ?Consultants:  ?Neurology ? ?Procedures:  ?TTE: ? ?Antimicrobials:  ?None ? ? ?Subjective: ?Patient seen examined bedside, resting comfortably.  Eating breakfast.  Continues with right-sided weakness.  No family present.  Remains on isolation, anticipate likely 10 days prior to being able to go to CIR.  No other complaints or concerns at this time.  Denies headache, no visual changes, no fever/chills/night sweats, no nausea/vomiting/diarrhea, no cough/congestion, no chest pain, no palpitations, no shortness of breath, no abdominal pain.  No acute events overnight per nursing staff. ? ?Objective: ?Vitals:  ? 11/16/21 2100 11/16/21 2319 11/17/21 0427 11/17/21 0806  ?BP:  127/77 136/82 (!) 151/81  ?Pulse: (!) 54 (!) 54 (!) 54 (!) 59  ?Resp:  16 16 14   ?Temp:  98 ?F (36.7 ?C) 97.8 ?F (36.6 ?C) 98.2 ?F (36.8 ?C)  ?TempSrc:  Oral Oral Oral  ?SpO2:  100% 100% 99%  ? ? ?Intake/Output Summary (Last 24 hours) at 11/17/2021 1059 ?Last data filed at 11/17/2021 0900 ?Gross per 24 hour  ?Intake 240 ml  ?Output 1100 ml  ?Net -860 ml  ? ?There were no vitals filed for this  visit. ? ?Examination: ? ?Physical Exam: ?GEN: NAD, alert and oriented x 3, thin/cachectic in appearance, chronically ill and appears older than stated age ?HEENT: NCAT, PERRL, EOMI, sclera clear, MMM ?PULM: CTAB w/o wheezes/crackles, normal respiratory effort, on room air ?CV: RRR w/o M/G/R ?GI: abd soft, NTND, NABS, no R/G/M ?MSK: no peripheral edema, muscle strength right upper/lower extremity 4/5, right-sided muscle strength preserved 5/5 ?NEURO: CN II-XII intact, sensation to light touch intact ?PSYCH: normal mood/affect ?Integumentary: dry/intact, no rashes or wounds ? ? ? ?Data Reviewed: I have personally reviewed following labs and imaging studies ? ?CBC: ?Recent Labs  ?Lab 11/15/21 ?1131  ?WBC 9.5  ?NEUTROABS 7.0  ?HGB 13.3  ?HCT 39.3  ?MCV 92.0  ?PLT 311  ? ?Basic Metabolic Panel: ?Recent Labs  ?Lab 11/15/21 ?1131  ?NA 139  ?K 4.3  ?CL 102  ?CO2 29  ?GLUCOSE 145*  ?BUN 18  ?CREATININE 0.96  ?CALCIUM 9.2  ?MG 2.1  ? ?GFR: ?CrCl cannot be calculated (Unknown ideal weight.). ?Liver Function Tests: ?Recent Labs  ?Lab 11/15/21 ?1131  ?AST 24  ?ALT 16  ?ALKPHOS 65  ?BILITOT 0.4  ?PROT 7.2  ?ALBUMIN 4.1  ? ?Recent Labs  ?Lab 11/15/21 ?1131  ?LIPASE 36  ? ?Recent Labs  ?Lab 11/15/21 ?1131  ?AMMONIA 19  ? ?Coagulation Profile: ?No results for input(s): INR, PROTIME in the last 168 hours. ?Cardiac Enzymes: ?No results for input(s): CKTOTAL, CKMB, CKMBINDEX, TROPONINI in the last 168 hours. ?BNP (last 3 results) ?No results for input(s): PROBNP in the last 8760 hours. ?HbA1C: ?Recent Labs  ?  11/16/21 ?0508  ?HGBA1C 5.0  ? ?CBG: ?No results for input(s): GLUCAP in the last 168  hours. ?Lipid Profile: ?Recent Labs  ?  11/16/21 ?0508  ?CHOL 153  ?HDL 55  ?LDLCALC 90  ?TRIG 38  ?CHOLHDL 2.8  ? ?Thyroid Function Tests: ?Recent Labs  ?  11/16/21 ?1200  ?TSH 2.708  ? ?Anemia Panel: ?No results for input(s): VITAMINB12, FOLATE, FERRITIN, TIBC, IRON, RETICCTPCT in the last 72 hours. ?Sepsis Labs: ?No results for input(s):  PROCALCITON, LATICACIDVEN in the last 168 hours. ? ?Recent Results (from the past 240 hour(s))  ?Resp Panel by RT-PCR (Flu A&B, Covid) Nasopharyngeal Swab     Status: Abnormal  ? Collection Time: 11/15/21 11:31 AM  ? Speci

## 2021-11-17 NOTE — Evaluation (Signed)
Speech Language Pathology Evaluation ?Patient Details ?Name: Ralph Jackson ?MRN: [id number] ?DOB: [birth year] ?Today's Date: 11/17/2021 ?Time: 9528-4132 ?SLP Time Calculation (min) (ACUTE ONLY): 12 min ? ?Problem List:  ?Patient Active Problem List  ? Diagnosis Date Noted  ? COVID-19 virus infection 11/16/2021  ? CVA (cerebral vascular accident) (HCC) 11/15/2021  ? Hypothyroidism 11/15/2021  ? GERD (gastroesophageal reflux disease) 11/15/2021  ? Tobacco abuse 11/15/2021  ? HLD (hyperlipidemia) 11/15/2021  ? Pulmonary edema 06/28/2021  ? Acute respiratory failure with hypoxia (HCC)   ? Closed displaced fracture of right femoral neck (HCC) 06/19/2021  ? 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 HTN (hypertension) 06/19/2021  ? Protein-calorie malnutrition, severe (HCC) 07/08/2014  ? TSH elevation 07/08/2014  ? FTT (failure to thrive) in adult 07/08/2014  ? Alcohol dependence with alcohol-induced mood disorder (HCC)   ? Alcohol abuse 07/05/2014  ? Alcohol dependence (HCC) 04/04/2014  ? ?Past Medical History:  ?Past Medical History:  ?Diagnosis Date  ? Depression   ? ETOH abuse   ? In remission for several years now  ? Hypertension   ? Urinary tract infection   ? ?Past Surgical History:  ?Past Surgical History:  ?Procedure Laterality Date  ? TOTAL HIP ARTHROPLASTY Right 06/21/2021  ? Procedure: TOTAL HIP ARTHROPLASTY ANTERIOR APPROACH;  Surgeon: Samson Frederic, MD;  Location: [facility]S;  Service: Orthopedics;  Laterality: Right;  ? ?HPI:  ?Pt is 76yo male who presented to ED with R UE/LE weakness. 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 PMH: tobacco abuse, HTN, hypthyroidism, HLD< GERD, dementia, and ETOH abuse PSH: R THA 10/22. 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  ? ?Assessment / Plan / Recommendation ?Clinical Impression ? Pt presents with likely baseline communication - small left ACA infarcts did not impact speech clarlity, fluency, nor expressive language. [name] was able to describe a typical day with appropriate language parameters.  Comprehension is also [facility]. Able to read aloud without difficulty. Oriented x4; normal attention and basic short-term recall.  No overt deficits are evident that would require SLP intervention. PT/OT are recommending AIR.  Our service will sign off. ?   ?SLP Assessment ? SLP Recommendation/Assessment: Patient does not need any further Speech Lanaguage Pathology Services ?SLP Visit Diagnosis: Cognitive communication deficit (R41.841)  ?  ?Recommendations for follow up therapy are one component of a multi-disciplinary discharge planning process, led by the attending physician.  Recommendations may be updated based on patient status, additional functional criteria and insurance authorization. ?   ?Follow Up Recommendations ? No SLP follow up  ?  ?    ?    ?    ?  ?  ?   ?SLP Evaluation ?Cognition ? Overall Cognitive Status: No family/caregiver present to determine baseline cognitive functioning ?Arousal/Alertness: Awake/alert ?Orientation Level: Oriented X4 ?Attention: Sustained ?Sustained Attention: Appears intact ?Memory: Appears intact  ?  ?   ?Comprehension ? Auditory Comprehension ?Overall Auditory Comprehension: Appears within functional limits for tasks assessed ?Yes/No Questions: Within Functional Limits ?Commands: Within Functional Limits ?Conversation: Simple ?Visual Recognition/Discrimination ?Discrimination: Within Function Limits ?Reading Comprehension ?Reading Status: Within funtional limits  ?  ?Expression Expression ?Primary Mode of Expression: Verbal ?Verbal Expression ?Initiation: No impairment ?Repetition: No impairment ?Naming: No impairment ?Written Expression ?Dominant Hand: Right ?Written Expression: Not tested   ?Oral / Motor ? Oral Motor/Sensory Function ?Overall Oral Motor/Sensory Function: Within functional limits ?Motor Speech ?Overall Motor Speech: Appears within functional limits for tasks assessed    ?        ? ?  Blenda Mounts Laurice ?11/17/2021, 11:54 AM ?Marchelle Folks L. Cassandria Drew, MA CCC/SLP ?Acute Rehabilitation Services ?Office number [phone] ?Pager [phone] ? ?

## 2021-11-18 NOTE — Plan of Care (Signed)
?  Problem: Education: ?Goal: Knowledge of General Education information will improve ?Description: Including pain rating scale, medication(s)/side effects and non-pharmacologic comfort measures ?Outcome: Progressing ?  ?Problem: Health Behavior/Discharge Planning: ?Goal: Ability to manage health-related needs will improve ?Outcome: Progressing ?  ?Problem: Clinical Measurements: ?Goal: Ability to maintain clinical measurements within normal limits will improve ?Outcome: Progressing ?Goal: Will remain free from infection ?Outcome: Progressing ?Goal: Diagnostic test results will improve ?Outcome: Progressing ?Goal: Respiratory complications will improve ?Outcome: Progressing ?Goal: Cardiovascular complication will be avoided ?Outcome: Progressing ?  ?Problem: Activity: ?Goal: Risk for activity intolerance will decrease ?Outcome: Progressing ?  ?Problem: Nutrition: ?Goal: Adequate nutrition will be maintained ?Outcome: Progressing ?  ?Problem: Elimination: ?Goal: Will not experience complications related to bowel motility ?Outcome: Progressing ?Goal: Will not experience complications related to urinary retention ?Outcome: Progressing ?  ?Problem: Coping: ?Goal: Level of anxiety will decrease ?Outcome: Progressing ?  ?

## 2021-11-18 NOTE — Progress Notes (Signed)
?PROGRESS NOTE ? ? ? ?Ralph Jackson  [id number] DOB: 21-Mar-1946 DOA: 11/15/2021 ?PCP: [name], PA-C  ? ? ?Brief Narrative:  ?Ralph Jackson is a 76 year old male with past medical history significant for anxiety/depression, hypothyroidism, essential hypertension, GERD, hyperlipidemia, prior history of EtOH abuse in remission who initially presented to [facility] ED on 3/16 complaining of right-sided weakness, right-sided facial droop, poor appetite.  When family was assisting him during breakfast, seem he was drooling and could not keep the boost shake in his mouth.  Due to this concern, family brought him to the ED for further evaluation. ? ?In the ED, temperature 98.9 ?F, HR 57, RR 18, BP 150/77, SPO2 100% on room air.  WBC 9.5, hemoglobin 13.3, platelets 311.  Sodium 139, potassium 4.3, chloride 102, CO2 29, glucose 145, BUN 18, creatinine 0.96.  Lipase 36, AST 24, ALT 16, total bilirubin 0.4, ammonia level 19.  Urinalysis unrevealing.  COVID-19 PCR positive.  Influenza A/B PCR negative.  CT head without contrast with small cortical/subcortical left frontal infarcts likely acute to subacute, chronic infarct and chronic microvascular ischemic changes noted.  Neurology was consulted.  TRH consulted for further evaluation and management of acute versus subacute stroke.  Patient was transferred to [facility] for stroke team evaluation.  ? ? ? ?Assessment and Plan: ?* CVA (cerebral vascular accident) ([place]) ?Patient presenting to the ED with right facial droop, right-sided upper/lower extremity weakness.  CT head without contrast with findings of small cortical/subcortical left frontal infarct likely acute to subacute.  MR brain without contrast with acute/subacute infarcts left frontal lobe cortex and white matter, no evidence of hemorrhagic conversion, mass affect or midline shift.  MRA head/neck with no intracranial large vessel occlusion, moderate to severe narrowing distal left P3 and  narrowing in mid left M1 and multifocal narrowing in the mid to distal left A2 and A3 segments, no hemodynamically significant stenosis in the neck.  LDL 90, hemoglobin A1c 5.0.  TTE with LVEF 60 to 65%, mild focal basal septal left ventricular hypertrophy, grade 2 diastolic dysfunction, RV systolic function within normal limits, LA mildly dilated, no aortic stenosis, IVC normal in size, no atrial shunt.  Neurology initially following now signed off, recommending 4-week follow-up outpatient. ?--DAPT with Aspirin 325 mg p.o. daily and Plavix 75 mg p.o. daily x 3 months followed by aspirin alone ?--Crestor 20 mg p.o. daily ?--PT/OT currently recommending CIR ?--Continue monitor on telemetry ? ? ?COVID-19 virus infection ?COVID-19 PCR positive on admission.  Chest x-ray unrevealing.  Patient reports vaccination in the past.  Asymptomatic. ?--Paxlovid BID x 5 days ?--Airborne/contact isolation x10 days from 3/16 ?--Supportive care ? ?HTN (hypertension) ?Home medications include metoprolol tartrate 12.5 mg p.o. twice daily. ?--Holding home metoprolol given slight bradycardia ?--Continue to monitor BP closely ? ?Hypothyroidism ?TSH: 2.708, within normal limits ?--Continue levothyroxine 50 mcg p.o. daily ? ?GERD (gastroesophageal reflux disease) ?--Continue PPI ? ?HLD (hyperlipidemia) ?Lipid panel with total cholesterol 153, HDL 55, LDL 90, triglycerides 38.  On pravastatin 10 mg p.o. daily at home. ?--Pravastatin changed to high intensity Crestor 20 mg p.o. daily ? ?Alcoholic dementia ([place]) ?Currently alert and oriented.  Continue to encourage cessation from alcohol use. ?--Delirium precautions ?--Get up during the day ?--Encourage a familiar face to remain present throughout the day ?--Keep blinds open and lights on during daylight hours ?--Minimize the use of opioids/benzodiazepines ? ?Tobacco abuse ?Counseled on need for complete cessation. ? ?Alcohol abuse, in remission ?Currently in remission.  Continue encourage  cessation. ? ?Protein-calorie malnutrition, severe ([place]) ?Nutrition Status: ?Nutrition Problem: Severe Malnutrition ?Etiology: social / environmental circumstances ?Signs/Symptoms: severe muscle depletion, severe fat depletion ?Interventions: MVI, Ensure Enlive (each supplement provides 350kcal and 20 grams of protein), Liberalize Diet ?--Dietitian following, diet liberalized and continue supplementation, encourage increase oral intake  ? ? ? ? ?DVT prophylaxis: enoxaparin (LOVENOX) injection 30 mg Start: 11/16/21 2200 ?SCD's Start: 11/15/21 1602 ? ?  Code Status: Full Code ?Family Communication: Updated patients daughter, Anderson Malta via telephone this morning ? ?Disposition Plan:  ?Level of care: Telemetry Medical ?Status is: Inpatient ?Remains inpatient appropriate because: Pending further stroke work-up, likely will need CIR versus SNF on discharge; will need to continue 10-day isolation inpatient, will be ready for discharge on 11/26/2021 ?  ? ?Consultants:  ?Neurology ? ?Procedures:  ?TTE: ? ?Antimicrobials:  ?None ? ? ?Subjective: ?Patient seen examined bedside, resting comfortably.  Sleeping but easily arousable.  Continues with right-sided weakness.  No family present.  Remains on isolation, anticipate likely 10 days prior to being able to go to CIR.  No other complaints or concerns at this time.  Denies headache, no visual changes, no fever/chills/night sweats, no nausea/vomiting/diarrhea, no cough/congestion, no chest pain, no palpitations, no shortness of breath, no abdominal pain.  No acute events overnight per nursing staff. ? ?Objective: ?Vitals:  ? 11/17/21 2038 11/18/21 0012 11/18/21 0500 11/18/21 0837  ?BP: (!) 97/58 (!) 98/58 (!) 100/52 128/68  ?Pulse: (!) 54 (!) 57 (!) 54 60  ?Resp: 17 16 18 18   ?Temp: 98 ?F (36.7 ?C) 98 ?F (36.7 ?C) 98.2 ?F (36.8 ?C) 97.9 ?F (36.6 ?C)  ?TempSrc: Oral Oral Oral Oral  ?SpO2: 97% 99%  100%  ? ? ?Intake/Output Summary (Last 24 hours) at 11/18/2021 1128 ?Last data filed  at 11/18/2021 0600 ?Gross per 24 hour  ?Intake --  ?Output 1100 ml  ?Net -1100 ml  ? ?There were no vitals filed for this visit. ? ?Examination: ? ?Physical Exam: ?GEN: NAD, alert and oriented x 3, thin/cachectic in appearance, chronically ill and appears older than stated age ?HEENT: NCAT, PERRL, EOMI, sclera clear, MMM ?PULM: CTAB w/o wheezes/crackles, normal respiratory effort, on room air ?CV: RRR w/o M/G/R ?GI: abd soft, NTND, NABS, no R/G/M ?MSK: no peripheral edema, muscle strength right upper/lower extremity 4/5, right-sided muscle strength preserved 5/5 ?NEURO: CN II-XII intact, sensation to light touch intact ?PSYCH: normal mood/affect ?Integumentary: dry/intact, no rashes or wounds ? ? ? ?Data Reviewed: I have personally reviewed following labs and imaging studies ? ?CBC: ?Recent Labs  ?Lab 11/15/21 ?1131  ?WBC 9.5  ?NEUTROABS 7.0  ?HGB 13.3  ?HCT 39.3  ?MCV 92.0  ?PLT 311  ? ?Basic Metabolic Panel: ?Recent Labs  ?Lab 11/15/21 ?1131  ?NA 139  ?K 4.3  ?CL 102  ?CO2 29  ?GLUCOSE 145*  ?BUN 18  ?CREATININE 0.96  ?CALCIUM 9.2  ?MG 2.1  ? ?GFR: ?CrCl cannot be calculated (Unknown ideal weight.). ?Liver Function Tests: ?Recent Labs  ?Lab 11/15/21 ?1131  ?AST 24  ?ALT 16  ?ALKPHOS 65  ?BILITOT 0.4  ?PROT 7.2  ?ALBUMIN 4.1  ? ?Recent Labs  ?Lab 11/15/21 ?1131  ?LIPASE 36  ? ?Recent Labs  ?Lab 11/15/21 ?1131  ?AMMONIA 19  ? ?Coagulation Profile: ?No results for input(s): INR, PROTIME in the last 168 hours. ?Cardiac Enzymes: ?No results for input(s): CKTOTAL, CKMB, CKMBINDEX, TROPONINI in the last 168 hours. ?BNP (last 3 results) ?No results for input(s): PROBNP in the last 8760 hours. ?HbA1C: ?Recent  Labs  ?  11/16/21 ?0508  ?HGBA1C 5.0  ? ?CBG: ?No results for input(s): GLUCAP in the last 168 hours. ?Lipid Profile: ?Recent Labs  ?  11/16/21 ?0508  ?CHOL 153  ?HDL 55  ?[place] 90  ?TRIG 38  ?CHOLHDL 2.8  ? ?Thyroid Function Tests: ?Recent Labs  ?  11/16/21 ?1200  ?TSH 2.708  ? ?Anemia Panel: ?No results for  input(s): VITAMINB12, FOLATE, FERRITIN, TIBC, IRON, RETICCTPCT in the last 72 hours. ?Sepsis Labs: ?No results for input(s): PROCALCITON, LATICACIDVEN in the last 168 hours. ? ?Recent Results (from the past 240 hour(

## 2021-11-18 NOTE — Progress Notes (Signed)
@  2133, Dr. Loney Loh, on-call for attending, text-paged to inquire about administering pt's scheduled PO Lopressor in light of his HR sustaining around 50 SR. Page return and Lopressor d/c'd. ?

## 2021-11-19 DIAGNOSIS — F418 Other specified anxiety disorders: Secondary | ICD-10-CM

## 2021-11-19 NOTE — Progress Notes (Signed)
?PROGRESS NOTE ? ? ? ?Ralph Jackson  [id number] DOB: [birth year] DOA: 11/15/2021 ?PCP: [name], PA-C  ? ? ?Brief Narrative:  ?Ralph Jackson is a 76 year old male with past medical history significant for anxiety/depression, hypothyroidism, essential hypertension, GERD, hyperlipidemia, prior history of EtOH abuse in remission who initially presented to [facility] ED on 3/16 complaining of right-sided weakness, right-sided facial droop, poor appetite.  When family was assisting him during breakfast, seem he was drooling and could not keep the boost shake in his mouth.  Due to this concern, family brought him to the ED for further evaluation. ? ?In the ED, temperature 98.9 ?F, HR 57, RR 18, BP 150/77, SPO2 100% on room air.  WBC 9.5, hemoglobin 13.3, platelets 311.  Sodium 139, potassium 4.3, chloride 102, CO2 29, glucose 145, BUN 18, creatinine 0.96.  Lipase 36, AST 24, ALT 16, total bilirubin 0.4, ammonia level 19.  Urinalysis unrevealing.  COVID-19 PCR positive.  Influenza A/B PCR negative.  CT head without contrast with small cortical/subcortical left frontal infarcts likely acute to subacute, chronic infarct and chronic microvascular ischemic changes noted.  Neurology was consulted.  TRH consulted for further evaluation and management of acute versus subacute stroke.  Patient was transferred to [facility] for stroke team evaluation.  ? ? ? ?Assessment and Plan: ?* CVA (cerebral vascular accident) ([place]) ?Patient presenting to the ED with right facial droop, right-sided upper/lower extremity weakness.  CT head without contrast with findings of small cortical/subcortical left frontal infarct likely acute to subacute.  MR brain without contrast with acute/subacute infarcts left frontal lobe cortex and white matter, no evidence of hemorrhagic conversion, mass affect or midline shift.  MRA head/neck with no intracranial large vessel occlusion, moderate to severe narrowing distal left P3 and  narrowing in mid left M1 and multifocal narrowing in the mid to distal left A2 and A3 segments, no hemodynamically significant stenosis in the neck.  LDL 90, hemoglobin A1c 5.0.  TTE with LVEF 60 to 65%, mild focal basal septal left ventricular hypertrophy, grade 2 diastolic dysfunction, RV systolic function within normal limits, LA mildly dilated, no aortic stenosis, IVC normal in size, no atrial shunt.  Neurology initially following now signed off, recommending 4-week follow-up outpatient. ?--DAPT with Aspirin 325 mg p.o. daily and Plavix 75 mg p.o. daily x 3 months followed by aspirin alone ?--Crestor 20 mg p.o. daily ?--PT/OT currently recommending CIR ?--Continue monitor on telemetry ? ? ?COVID-19 virus infection ?COVID-19 PCR positive on admission.  Chest x-ray unrevealing.  Patient reports vaccination in the past.  Asymptomatic. ?--Paxlovid BID x 5 days ?--Airborne/contact isolation x10 days from 3/16 ?--Supportive care ? ?HTN (hypertension) ?Home medications include metoprolol tartrate 12.5 mg p.o. twice daily. ?--Discontinued home metoprolol due to asymptomatic bradycardia ?--Continue to monitor BP closely ? ?Hypothyroidism ?TSH: 2.708, within normal limits ?--Continue levothyroxine 50 mcg p.o. daily ? ?GERD (gastroesophageal reflux disease) ?--Continue PPI ? ?HLD (hyperlipidemia) ?Lipid panel with total cholesterol 153, HDL 55, LDL 90, triglycerides 38.  On pravastatin 10 mg p.o. daily at home. ?--Pravastatin changed to high intensity Crestor 20 mg p.o. daily ? 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  Continue to encourage cessation from alcohol use. ?--Delirium precautions ?--Get up during the day ?--Encourage a familiar face to remain present throughout the day ?--Keep blinds open and lights on during daylight hours ?--Minimize the use of opioids/benzodiazepines ? ?Tobacco abuse ?Counseled on need for complete cessation. ? ?Alcohol abuse, in remission ?Currently in remission.  Continue  encourage cessation. ? ?Protein-calorie malnutrition, severe ([place]) ?Nutrition Status: ?Nutrition Problem: Severe Malnutrition ?Etiology: social / environmental circumstances ?Signs/Symptoms: severe muscle depletion, severe fat depletion ?Interventions: MVI, Ensure Enlive (each supplement provides 350kcal and 20 grams of protein), Liberalize Diet ?--Dietitian following, diet liberalized and continue supplementation, encourage increase oral intake  ? ?Depression with anxiety ?--Lexapro 20 mg p.o. daily ? ? ? ? ?DVT prophylaxis: enoxaparin (LOVENOX) injection 30 mg Start: 11/16/21 2200 ?SCD's Start: 11/15/21 1602 ? ?  Code Status: Full Code ?Family Communication: No family present at bedside this morning. ? ?Disposition Plan:  ?Level of care: Telemetry Medical ?Status is: Inpatient ?Remains inpatient appropriate because: Continues on 10-day isolation for asymptomatic COVID infection, pending CIR once off of isolation ?  ? ?Consultants:  ?Neurology ? ?Procedures:  ?TTE: ? ?Antimicrobials:  ?None ? ? ?Subjective: ?Patient seen examined bedside, resting comfortably.  Patient asked for assistance with eating due to his upper extremity weakness.  Otherwise no other complaints or concerns at this time.  Awaiting 10-day isolation period before going to rehab/CIR. no other questions or concerns at this time. Denies headache, no visual changes, no fever/chills/night sweats, no nausea/vomiting/diarrhea, no cough/congestion, no chest pain, no palpitations, no shortness of breath, no abdominal pain.  No acute events overnight per nursing staff. ? ?Objective: ?Vitals:  ? 11/18/21 1935 11/18/21 2255 11/19/21 [id number] 11/19/21 0904  ?BP: 110/63 114/60 (!) 124/58 132/76  ?Pulse: (!) 56 (!) 55 (!) 57 (!) 51  ?Resp: 16 14 14 16   ?Temp: 98.1 ?F (36.7 ?C) 98.4 ?F (36.9 ?C) 97.8 ?F (36.6 ?C) 98.2 ?F (36.8 ?C)  ?TempSrc: Oral Oral Oral Oral  ?SpO2: 99% 99% 98% 100%  ? ? ?Intake/Output Summary (Last 24 hours) at 11/19/2021 1025 ?Last data filed  at 11/19/2021 0500 ?Gross per 24 hour  ?Intake 680 ml  ?Output 1050 ml  ?Net -370 ml  ? ?There were no vitals filed for this visit. ? ?Examination: ? ?Physical Exam: ?GEN: NAD, alert and oriented x 3, thin/cachectic in appearance, chronically ill and appears older than stated age ?HEENT: NCAT, PERRL, EOMI, sclera clear, MMM ?PULM: CTAB w/o wheezes/crackles, normal respiratory effort, on room air ?CV: RRR w/o M/G/R ?GI: abd soft, NTND, NABS, no R/G/M ?MSK: no peripheral edema, muscle strength right upper/lower extremity 4/5, right-sided muscle strength preserved 5/5 ?NEURO: CN II-XII intact, sensation to light touch intact ?PSYCH: normal mood/affect ?Integumentary: dry/intact, no rashes or wounds ? ? ? ?Data Reviewed: I have personally reviewed following labs and imaging studies ? ?CBC: ?Recent Labs  ?Lab 11/15/21 ?1131  ?WBC 9.5  ?NEUTROABS 7.0  ?HGB 13.3  ?HCT 39.3  ?MCV 92.0  ?PLT 311  ? ?Basic Metabolic Panel: ?Recent Labs  ?Lab 11/15/21 ?1131  ?NA 139  ?K 4.3  ?CL 102  ?CO2 29  ?GLUCOSE 145*  ?BUN 18  ?CREATININE 0.96  ?CALCIUM 9.2  ?MG 2.1  ? ?GFR: ?CrCl cannot be calculated (Unknown ideal weight.). ?Liver Function Tests: ?Recent Labs  ?Lab 11/15/21 ?1131  ?AST 24  ?ALT 16  ?ALKPHOS 65  ?BILITOT 0.4  ?PROT 7.2  ?ALBUMIN 4.1  ? ?Recent Labs  ?Lab 11/15/21 ?1131  ?LIPASE 36  ? ?Recent Labs  ?Lab 11/15/21 ?1131  ?AMMONIA 19  ? ?Coagulation Profile: ?No results for input(s): INR, PROTIME in the last 168 hours. ?Cardiac Enzymes: ?No results for input(s): CKTOTAL, CKMB, CKMBINDEX, TROPONINI in the last 168 hours. ?BNP (last 3 results) ?No results for input(s): PROBNP in the last 8760 hours. ?HbA1C: ?No results for input(s):  HGBA1C in the last 72 hours. ? ?CBG: ?No results for input(s): GLUCAP in the last 168 hours. ?Lipid Profile: ?No results for input(s): CHOL, HDL, LDLCALC, TRIG, CHOLHDL, LDLDIRECT in the last 72 hours. ? ?Thyroid Function Tests: ?Recent Labs  ?  11/16/21 ?1200  ?TSH 2.708  ? ?Anemia Panel: ?No  results for input(s): VITAMINB12, FOLATE, FERRITIN, TIBC, IRON, RETICCTPCT in the last 72 hours. ?Sepsis Labs: ?No results for input(s): PROCALCITON, LATICACIDVEN in the last 168 hours. ? ?Recent Results (from t

## 2021-11-19 NOTE — Progress Notes (Signed)
Patient unable to attend CIR until 3/27 due to covid. CM called patients daughter and she is not interested in patient attending another IR that can accept patient positive for covid as it would be a transportation burden.  ?TOC following. ?

## 2021-11-19 NOTE — Assessment & Plan Note (Signed)
Lexapro 20 mg po daily 

## 2021-11-19 NOTE — Plan of Care (Signed)
?  Problem: Education: ?Goal: Knowledge of disease or condition will improve ?Outcome: Progressing ?Goal: Knowledge of secondary prevention will improve (SELECT ALL) ?Outcome: Progressing ?Goal: Knowledge of patient specific risk factors will improve (INDIVIDUALIZE FOR PATIENT) ?Outcome: Progressing ?  ?

## 2021-11-19 NOTE — Progress Notes (Signed)
Physical Therapy Treatment ?Patient Details ?Name: Ralph Jackson ?MRN: [id number] ?DOB: [birth year] ?Today's Date: 11/19/2021 ? ? ?History of Present Illness Pt is 76yo male who presented to ED with R UE/LE weakness. 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 PMH: tobacco abuse, HTN, hypthyroidism, HLD< GERD, dementia, and ETOH abuse PSH: R THA 10/22. ? ?  ?PT Comments  ? ? Pt with improved transfer ability to EOB today and was able to begin gait training however pt with noted leg length discrepancy and R LE antalgic gait pattern with modA for safe walker management to maintain balance. Worked on R LE strengthening as well. Acute PT to cont to follow. Continue to recommend AIR Upon d/c to allow pt to achieve safe supervision level of function for safe transition home. ?   ?Recommendations for follow up therapy are one component of a multi-disciplinary discharge planning process, led by the attending physician.  Recommendations may be updated based on patient status, additional functional criteria and insurance authorization. ? ?Follow Up Recommendations ? Acute inpatient rehab (3hours/day) ?  ?  ?Assistance Recommended at Discharge Frequent or constant Supervision/Assistance  ?Patient can return home with the following A lot of help with walking and/or transfers;A lot of help with bathing/dressing/bathroom;Assistance with cooking/housework;Direct supervision/assist for medications management;Direct supervision/assist for financial management;Assist for transportation;Help with stairs or ramp for entrance ?  ?Equipment Recommendations ? Rolling walker (2 wheels)  ?  ?Recommendations for Other Services Rehab consult ? ? ?  ?Precautions / Restrictions Precautions ?Precautions: Fall ?Precaution Comments: COVID+ ?Restrictions ?Weight Bearing Restrictions: No  ?  ? ?Mobility ? Bed Mobility ?Overal bed mobility: Needs Assistance ?Bed Mobility: Supine to Sit ?  ?  ?Supine to sit: Min assist ?  ?  ?General bed  mobility comments: HOB elevated, pt able to bring LEs off EOB, minA for trunk elevation ?  ? ?Transfers ?Overall transfer level: Needs assistance ?Equipment used: Rolling walker (2 wheels) ?Transfers: Sit to/from Stand ?Sit to Stand: Mod assist ?  ?  ?  ?  ?  ?General transfer comment: pt with good power up however continues with retropulsion, modA via tactile cues to bring hips forward and to steady during transition of hands from bed to RW ?  ? ?Ambulation/Gait ?Ambulation/Gait assistance: Mod assist, +2 safety/equipment ?Gait Distance (Feet): 20 Feet (x2, limited to room due to COVID+) ?Assistive device: Rolling walker (2 wheels) ?Gait Pattern/deviations: Step-to pattern, Knee flexed in stance - right, Antalgic, Trunk flexed, Narrow base of support ?Gait velocity: slow ?Gait velocity interpretation: <1.8 ft/sec, indicate of risk for recurrent falls ?  ?General Gait Details: pt with L LE longer than R due to pt with R knee flexion in stance, pt also with noted adduction of R LE during swing phase, modA for walker management, pt c/o R hip pain with WBing with noted antalgic limping on R side due to leg length discrepancy, ? ? ?Stairs ?  ?  ?  ?  ?  ? ? ?Wheelchair Mobility ?  ? ?Modified Rankin (Stroke Patients Only) ?Modified Rankin (Stroke Patients Only) ?Pre-Morbid Rankin Score: Moderate disability ?Modified Rankin: Severe disability ? ? ?  ?Balance Overall balance assessment: Needs assistance ?Sitting-balance support: Feet supported, No upper extremity supported ?Sitting balance-Leahy Scale: Fair ?  ?  ?Standing balance support: Bilateral upper extremity supported, During functional activity ?Standing balance-Leahy Scale: Poor ?Standing balance comment: dependent on RW ?  ?  ?  ?  ?  ?  ?  ?  ?  ?  ?  ?  ? ?  ?  Cognition Arousal/Alertness: Awake/alert ?Behavior During Therapy: Flat affect ?Overall Cognitive Status: No family/caregiver present to determine baseline cognitive functioning ?  ?  ?  ?  ?  ?  ?  ?  ?   ?  ?  ?  ?  ?  ?  ?  ?General Comments: pt with inconsistent report of feeling pain t/o session, pt more talkative with therapy compared to initial eval however remains flat with delayed response time, pt reports crawling to the bathroom PTA, unsure of the accuracy of this ?  ?  ? ?  ?Exercises Other Exercises ?Other Exercises: worked on achieving R knee extension in standing, in walker, pt with difficulty maintaining trunk extension to neutral and achieve R knee extension in standing ? ?  ?General Comments General comments (skin integrity, edema, etc.): VSS on RA ?  ?  ? ?Pertinent Vitals/Pain Pain Assessment ?Pain Assessment: 0-10 ?Pain Score: 10-Worst pain ever ?Pain Location: R hip  ? ? ?Home Living   ?  ?  ?  ?  ?  ?  ?  ?  ?  ?   ?  ?Prior Function    ?  ?  ?   ? ?PT Goals (current goals can now be found in the care plan section) Progress towards PT goals: Progressing toward goals ? ?  ?Frequency ? ? ? Min 4X/week ? ? ? ?  ?PT Plan Current plan remains appropriate  ? ? ?Co-evaluation   ?  ?  ?  ?  ? ?  ?AM-PAC PT "6 Clicks" Mobility   ?Outcome Measure ? Help needed turning from your back to your side while in a flat bed without using bedrails?: A Little ?Help needed moving from lying on your back to sitting on the side of a flat bed without using bedrails?: A Little ?Help needed moving to and from a bed to a chair (including a wheelchair)?: A Lot ?Help needed standing up from a chair using your arms (e.g., wheelchair or bedside chair)?: A Lot ?Help needed to walk in hospital room?: A Lot ?Help needed climbing 3-5 steps with a railing? : Total ?6 Click Score: 13 ? ?  ?End of Session Equipment Utilized During Treatment: Gait belt ?Activity Tolerance: Patient tolerated treatment well ?Patient left: in chair;with call bell/phone within reach;with chair alarm set ?Nurse Communication: Mobility status ?PT Visit Diagnosis: Unsteadiness on feet (R26.81);Muscle weakness (generalized) (M62.81);Difficulty in walking,  not elsewhere classified (R26.2) ?  ? ? ?Time: [id number] ?PT Time Calculation (min) (ACUTE ONLY): 23 min ? ?Charges:  $Gait Training: 8-22 mins ?$Therapeutic Exercise: 8-22 mins          ?          ? ?Ralph Jackson, PT, DPT ?Acute Rehabilitation Services ?Pager #: [phone] ?Office #: [phone] ? ? ? ?Ralph Jackson ?11/19/2021, 1:00 PM ? ?

## 2021-11-20 NOTE — Progress Notes (Signed)
?PROGRESS NOTE ? ? ? ?WYLER SOUTHWOOD  [id number] DOB: February 16, 1946 DOA: 11/15/2021 ?PCP: [name], PA-C  ? ? ?Brief Narrative:  ?Ralph Jackson is a 76 year old male with past medical history significant for anxiety/depression, hypothyroidism, essential hypertension, GERD, hyperlipidemia, prior history of EtOH abuse in remission who initially presented to [facility] ED on 3/16 complaining of right-sided weakness, right-sided facial droop, poor appetite.  When family was assisting him during breakfast, seem he was drooling and could not keep the boost shake in his mouth.  Due to this concern, family brought him to the ED for further evaluation. ? ?In the ED, temperature 98.9 ?F, HR 57, RR 18, BP 150/77, SPO2 100% on room air.  WBC 9.5, hemoglobin 13.3, platelets 311.  Sodium 139, potassium 4.3, chloride 102, CO2 29, glucose 145, BUN 18, creatinine 0.96.  Lipase 36, AST 24, ALT 16, total bilirubin 0.4, ammonia level 19.  Urinalysis unrevealing.  COVID-19 PCR positive.  Influenza A/B PCR negative.  CT head without contrast with small cortical/subcortical left frontal infarcts likely acute to subacute, chronic infarct and chronic microvascular ischemic changes noted.  Neurology was consulted.  TRH consulted for further evaluation and management of acute versus subacute stroke.  Patient was transferred to [facility] for stroke team evaluation.  ? ? ? ?Assessment and Plan: ?* CVA (cerebral vascular accident) ([place]) ?Patient presenting to the ED with right facial droop, right-sided upper/lower extremity weakness.  CT head without contrast with findings of small cortical/subcortical left frontal infarct likely acute to subacute.  MR brain without contrast with acute/subacute infarcts left frontal lobe cortex and white matter, no evidence of hemorrhagic conversion, mass affect or midline shift.  MRA head/neck with no intracranial large vessel occlusion, moderate to severe narrowing distal left P3 and  narrowing in mid left M1 and multifocal narrowing in the mid to distal left A2 and A3 segments, no hemodynamically significant stenosis in the neck.  LDL 90, hemoglobin A1c 5.0.  TTE with LVEF 60 to 65%, mild focal basal septal left ventricular hypertrophy, grade 2 diastolic dysfunction, RV systolic function within normal limits, LA mildly dilated, no aortic stenosis, IVC normal in size, no atrial shunt.  Neurology initially following now signed off, recommending 4-week follow-up outpatient. ?--DAPT with Aspirin 325 mg p.o. daily and Plavix 75 mg p.o. daily x 3 months followed by aspirin alone ?--Crestor 20 mg p.o. daily ?--PT/OT currently recommending CIR ?--Continue monitor on telemetry3 ?--Outpatient follow-up [facility] 4 weeks ? ? ?COVID-19 virus infection ?COVID-19 PCR positive on admission.  Chest x-ray unrevealing.  Patient reports vaccination in the past.  Asymptomatic. ?--Paxlovid BID x 5 days ?--Airborne/contact isolation x10 days from 3/16 ?--Supportive care ? ?HTN (hypertension) ?Home medications include metoprolol tartrate 12.5 mg p.o. twice daily. ?--Discontinued home metoprolol due to asymptomatic bradycardia ?--Continue to monitor BP closely ? ?Hypothyroidism ?TSH: 2.708, within normal limits ?--Continue levothyroxine 50 mcg p.o. daily ? ?GERD (gastroesophageal reflux disease) ?--Continue PPI ? ?HLD (hyperlipidemia) ?Lipid panel with total cholesterol 153, HDL 55, LDL 90, triglycerides 38.  On pravastatin 10 mg p.o. daily at home. ?--Pravastatin changed to high intensity Crestor 20 mg p.o. daily ? 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  Continue to encourage cessation from alcohol use. ?--Delirium precautions ?--Get up during the day ?--Encourage a familiar face to remain present throughout the day ?--Keep blinds open and lights on during daylight hours ?--Minimize the use of opioids/benzodiazepines ? ?Tobacco abuse ?Counseled on need for complete cessation. ? ?Alcohol abuse, in  remission ?Currently in remission.  Continue encourage cessation. ? ?Protein-calorie malnutrition, severe ([place]) ?Nutrition Status: ?Nutrition Problem: Severe Malnutrition ?Etiology: social / environmental circumstances ?Signs/Symptoms: severe muscle depletion, severe fat depletion ?Interventions: MVI, Ensure Enlive (each supplement provides 350kcal and 20 grams of protein), Liberalize Diet ?--Dietitian following, diet liberalized and continue supplementation, encourage increase oral intake  ? ?Depression with anxiety ?--Lexapro 20 mg p.o. daily ? ? ? ? ?DVT prophylaxis: enoxaparin (LOVENOX) injection 30 mg Start: 11/16/21 2200 ?SCD's Start: 11/15/21 1602 ? ?  Code Status: Full Code ?Family Communication: No family present at bedside this morning. ? ?Disposition Plan:  ?Level of care: Telemetry Medical ?Status is: Inpatient ?Remains inpatient appropriate because: Continues on 10-day isolation for asymptomatic COVID infection, pending CIR once off of isolation; likely 3/27 ?  ? ?Consultants:  ?Neurology - signed off 3/17 ? ?Procedures:  ?TTE: ? ?Antimicrobials:  ?None ? ? ?Subjective: ?Patient seen examined bedside, resting comfortably.  Lying in bed.  Just finished most of his breakfast.  Continues with right upper extremity weakness.  No other specific complaints or concerns at this time.  Discussed with patient once again awaiting 10-day isolation period before going to rehab/CIR. no other questions or concerns at this time. Denies headache, no visual changes, no fever/chills/night sweats, no nausea/vomiting/diarrhea, no cough/congestion, no chest pain, no palpitations, no shortness of breath, no abdominal pain.  No acute events overnight per nursing staff. ? ?Objective: ?Vitals:  ? 11/19/21 2102 11/19/21 2359 11/20/21 0310 11/20/21 [id number]  ?BP: (!) 91/53 118/61 100/65 118/67  ?Pulse: 69 60 60 63  ?Resp: 20 16 20 16   ?Temp: 98.1 ?F (36.7 ?C) 98.1 ?F (36.7 ?C) 98.5 ?F (36.9 ?C) 97.8 ?F (36.6 ?C)  ?TempSrc: Oral Oral  Oral Oral  ?SpO2: 98% 98% 98% 99%  ? ? ?Intake/Output Summary (Last 24 hours) at 11/20/2021 1055 ?Last data filed at 11/19/2021 1700 ?Gross per 24 hour  ?Intake 240 ml  ?Output 600 ml  ?Net -360 ml  ? ?There were no vitals filed for this visit. ? ?Examination: ? ?Physical Exam: ?GEN: NAD, alert and oriented x 3, thin/cachectic in appearance, chronically ill and appears older than stated age ?HEENT: NCAT, PERRL, EOMI, sclera clear, MMM ?PULM: CTAB w/o wheezes/crackles, normal respiratory effort, on room air ?CV: RRR w/o M/G/R ?GI: abd soft, NTND, NABS, no R/G/M ?MSK: no peripheral edema, muscle strength right upper/lower extremity 4/5, left-sided muscle strength preserved 5/5 ?NEURO: CN II-XII intact, sensation to light touch intact ?PSYCH: normal mood/affect ?Integumentary: dry/intact, no rashes or wounds ? ? ? ?Data Reviewed: I have personally reviewed following labs and imaging studies ? ?CBC: ?Recent Labs  ?Lab 11/15/21 ?1131  ?WBC 9.5  ?NEUTROABS 7.0  ?HGB 13.3  ?HCT 39.3  ?MCV 92.0  ?PLT 311  ? ?Basic Metabolic Panel: ?Recent Labs  ?Lab 11/15/21 ?1131  ?NA 139  ?K 4.3  ?CL 102  ?CO2 29  ?GLUCOSE 145*  ?BUN 18  ?CREATININE 0.96  ?CALCIUM 9.2  ?MG 2.1  ? ?GFR: ?CrCl cannot be calculated (Unknown ideal weight.). ?Liver Function Tests: ?Recent Labs  ?Lab 11/15/21 ?1131  ?AST 24  ?ALT 16  ?ALKPHOS 65  ?BILITOT 0.4  ?PROT 7.2  ?ALBUMIN 4.1  ? ?Recent Labs  ?Lab 11/15/21 ?1131  ?LIPASE 36  ? ?Recent Labs  ?Lab 11/15/21 ?1131  ?AMMONIA 19  ? ?Coagulation Profile: ?No results for input(s): INR, PROTIME in the last 168 hours. ?Cardiac Enzymes: ?No results for input(s): CKTOTAL, CKMB, CKMBINDEX, TROPONINI in the last 168 hours. ?BNP (  last 3 results) ?No results for input(s): PROBNP in the last 8760 hours. ?HbA1C: ?No results for input(s): HGBA1C in the last 72 hours. ? ?CBG: ?No results for input(s): GLUCAP in the last 168 hours. ?Lipid Profile: ?No results for input(s): CHOL, HDL, LDLCALC, TRIG, CHOLHDL, LDLDIRECT in the  last 72 hours. ? ?Thyroid Function Tests: ?No results for input(s): TSH, T4TOTAL, FREET4, T3FREE, THYROIDAB in the last 72 hours. ? ?Anemia Panel: ?No results for input(s): VITAMINB12, FOLATE, FERRITIN, TIBC, IRON, R

## 2021-11-20 NOTE — Progress Notes (Signed)
Physical Therapy Treatment ?Patient Details ?Name: Ralph Jackson ?MRN: [id number] ?DOB: 1946/01/02 ?Today's Date: 11/20/2021 ? ? ?History of Present Illness Pt is 76yo male who presented to ED with R UE/LE weakness. 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 PMH: tobacco abuse, HTN, hypthyroidism, HLD< GERD, dementia, and ETOH abuse PSH: R THA 10/22. ? ?  ?PT Comments  ? ? Pt in good spirits and happy to see PT. Focus on functional standing and R LE strengthening to minimize R knee buckling during ambulation. Pt was able to wash face and comb hair with R hand while supporting self with L hand at sink and assist from PT to increase weigh shift to R to promote R quad contraction with tactile cues. Pt with good effort today and improved transfer ability. Pt to continue to benefit from aggressive rehab program as pt needs to be able to complete stair negotiation to access bed and bath at daughters home. Acute PT to cont to follow. ?  ?Recommendations for follow up therapy are one component of a multi-disciplinary discharge planning process, led by the attending physician.  Recommendations may be updated based on patient status, additional functional criteria and insurance authorization. ? ?Follow Up Recommendations ? Acute inpatient rehab (3hours/day) ?  ?  ?Assistance Recommended at Discharge Frequent or constant Supervision/Assistance  ?Patient can return home with the following A lot of help with walking and/or transfers;A lot of help with bathing/dressing/bathroom;Assistance with cooking/housework;Direct supervision/assist for medications management;Direct supervision/assist for financial management;Assist for transportation;Help with stairs or ramp for entrance ?  ?Equipment Recommendations ? Rolling walker (2 wheels)  ?  ?Recommendations for Other Services Rehab consult ? ? ?  ?Precautions / Restrictions Precautions ?Precautions: Fall ?Precaution Comments: COVID+ ?Restrictions ?Weight Bearing Restrictions:  No  ?  ? ?Mobility ? Bed Mobility ?Overal bed mobility: Needs Assistance ?Bed Mobility: Supine to Sit ?  ?  ?Supine to sit: Min assist ?  ?  ?General bed mobility comments: HOB elevated, pt able to bring LEs off EOB, minA for trunk elevation ?  ? ?Transfers ?Overall transfer level: Needs assistance ?Equipment used: Rolling walker (2 wheels) ?Transfers: Sit to/from Stand ?Sit to Stand: Mod assist ?  ?  ?  ?  ?  ?General transfer comment: pt with good power up however continues with retropulsion, modA via tactile cues to bring hips forward and to steady during transition of hands from bed to RW, pt with knee flexion into hyperextension with cues ?  ? ?Ambulation/Gait ?Ambulation/Gait assistance: Mod assist, +2 safety/equipment ?Gait Distance (Feet): 20 Feet ?Assistive device: Rolling walker (2 wheels) ?Gait Pattern/deviations: Step-to pattern, Knee flexed in stance - right, Antalgic, Trunk flexed, Narrow base of support, Scissoring (ataxic, near scissoring) ?Gait velocity: slow ?Gait velocity interpretation: <1.8 ft/sec, indicate of risk for recurrent falls ?  ?General Gait Details: pt continues with R trendelenberg gait pattern due to R hip pain and weakness, with tactile cues knee flexion decreased however unable to sustain ? ? ?Stairs ?  ?  ?  ?  ?  ? ? ?Wheelchair Mobility ?  ? ?Modified Rankin (Stroke Patients Only) ?Modified Rankin (Stroke Patients Only) ?Pre-Morbid Rankin Score: Moderate disability ?Modified Rankin: Severe disability ? ? ?  ?Balance Overall balance assessment: Needs assistance ?Sitting-balance support: Feet supported, No upper extremity supported ?Sitting balance-Leahy Scale: Fair ?Sitting balance - Comments: Edge of chair, able to hold balance, not against challenge ?  ?Standing balance support: Bilateral upper extremity supported, During functional activity ?Standing balance-Leahy Scale: Fair ?Standing balance  comment: worked on standing at sink to wash face and comb hair. PT student at R  knee providing tactile cues to quad in addition to verbal cues to achieve terminal knee extension, no hyperextension, pt only able to sustain for about 5 seconds prior to buckling however was able to stand for 2 bouts of 3-4 min. PT to help maintain hips forward and weight shift to the R. ?  ?  ?  ?  ?  ?  ?  ?  ?  ?  ?  ?  ? ?  ?Cognition Arousal/Alertness: Awake/alert ?Behavior During Therapy: Flat affect ?Overall Cognitive Status: Within Functional Limits for tasks assessed ?  ?  ?  ?  ?  ?  ?  ?  ?  ?  ?  ?  ?  ?  ?  ?  ?General Comments: pt more alert and engaging with therapist, agreeable and interactive despite delayed repsonse, suspect this to be close to baseline, per daughter pt with depression and is quiet and keeps to self ?  ?  ? ?  ?Exercises   ? ?  ?General Comments General comments (skin integrity, edema, etc.): pt with red, irritated spot on back of head, pt states "thats been there for years". RN Notified. RN also stated HR was 155bpm whil amb ?  ?  ? ?Pertinent Vitals/Pain Pain Assessment ?Pain Assessment: Faces ?Faces Pain Scale: Hurts a little bit ?Pain Location: R hip  ? ? ?Home Living   ?  ?  ?  ?  ?  ?  ?  ?  ?  ?   ?  ?Prior Function    ?  ?  ?   ? ?PT Goals (current goals can now be found in the care plan section) Acute Rehab PT Goals ?Patient Stated Goal: didn't state ?PT Goal Formulation: With patient ?Time For Goal Achievement: 11/30/21 ?Potential to Achieve Goals: Good ?Progress towards PT goals: Progressing toward goals ? ?  ?Frequency ? ? ? Min 4X/week ? ? ? ?  ?PT Plan Current plan remains appropriate  ? ? ?Co-evaluation   ?  ?  ?  ?  ? ?  ?AM-PAC PT "6 Clicks" Mobility   ?Outcome Measure ? Help needed turning from your back to your side while in a flat bed without using bedrails?: A Little ?Help needed moving from lying on your back to sitting on the side of a flat bed without using bedrails?: A Little ?Help needed moving to and from a bed to a chair (including a wheelchair)?: A  Lot ?Help needed standing up from a chair using your arms (e.g., wheelchair or bedside chair)?: A Lot ?Help needed to walk in hospital room?: A Lot ?Help needed climbing 3-5 steps with a railing? : Total ?6 Click Score: 13 ? ?  ?End of Session Equipment Utilized During Treatment: Gait belt ?Activity Tolerance: Patient tolerated treatment well ?Patient left: in chair;with call bell/phone within reach;with chair alarm set ?Nurse Communication: Mobility status ?PT Visit Diagnosis: Unsteadiness on feet (R26.81);Muscle weakness (generalized) (M62.81);Difficulty in walking, not elsewhere classified (R26.2) ?  ? ? ?Time: 9628-3662 ?PT Time Calculation (min) (ACUTE ONLY): 32 min ? ?Charges:             ?          ? ?Lewis Shock, PT, DPT ?Acute Rehabilitation Services ?Pager #: [phone] ?Office #: [phone] ? ? ? ?Jaylinn Hellenbrand M Edward Guthmiller ?11/20/2021, 3:07 PM ? ?

## 2021-11-20 NOTE — Progress Notes (Signed)
OT Cancellation Note ? ?Patient Details ?Name: Ralph Jackson ?MRN: [id number] ?DOB: [birth year] ? ? ?Cancelled Treatment:    Reason Eval/Treat Not Completed: Patient declined, no reason specified (Patient stated he did not want to participate at this time. Patient was explained the benefits of therapy and patient continued to decline therapy. Will attmpt at later time is scheducle permits.) ?Alfonse Flavors, OTA ?Acute Rehabilitation Services  ?Pager [phone] ?Office [phone] ? ?Deborrah Mabin Jeannett Senior ?11/20/2021, 1:13 PM ?

## 2021-11-21 LAB — CBC
HCT: 35.2 % — ABNORMAL LOW (ref 39.0–52.0)
Hemoglobin: 11.6 g/dL — ABNORMAL LOW (ref 13.0–17.0)
MCH: 30.1 pg (ref 26.0–34.0)
MCHC: 33 g/dL (ref 30.0–36.0)
MCV: 91.4 fL (ref 80.0–100.0)
Platelets: 276 10*3/uL (ref 150–400)
RBC: 3.85 MIL/uL — ABNORMAL LOW (ref 4.22–5.81)
RDW: 15.1 % (ref 11.5–15.5)
WBC: 8.8 10*3/uL (ref 4.0–10.5)
nRBC: 0 % (ref 0.0–0.2)

## 2021-11-21 LAB — BASIC METABOLIC PANEL
Anion gap: 8 (ref 5–15)
BUN: 20 mg/dL (ref 8–23)
CO2: 29 mmol/L (ref 22–32)
Calcium: 9.2 mg/dL (ref 8.9–10.3)
Chloride: 101 mmol/L (ref 98–111)
Creatinine, Ser: 0.91 mg/dL (ref 0.61–1.24)
GFR, Estimated: >60 mL/min (ref >60)
Glucose, Bld: 77 mg/dL (ref 70–99)
Potassium: 4.7 mmol/L (ref 3.5–5.1)
Sodium: 138 mmol/L (ref 135–145)

## 2021-11-21 LAB — PHOSPHORUS: Phosphorus: 4.2 mg/dL (ref 2.5–4.6)

## 2021-11-21 LAB — MAGNESIUM: Magnesium: 1.7 mg/dL (ref 1.7–2.4)

## 2021-11-21 NOTE — Plan of Care (Signed)
?  Problem: Education: ?Goal: Knowledge of disease or condition will improve ?Outcome: Progressing ?Goal: Knowledge of patient specific risk factors will improve (INDIVIDUALIZE FOR PATIENT) ?Outcome: Progressing ?  ?Problem: Nutrition: ?Goal: Risk of aspiration will decrease ?Outcome: Progressing ?  ?

## 2021-11-21 NOTE — Plan of Care (Signed)
?  Problem: Education: ?Goal: Knowledge of disease or condition will improve ?11/21/2021 1725 by Cleophas Dunker, RN ?Outcome: Progressing ?11/21/2021 1722 by Cleophas Dunker, RN ?Outcome: Progressing ?  ?Problem: Education: ?Goal: Knowledge of patient specific risk factors will improve (INDIVIDUALIZE FOR PATIENT) ?11/21/2021 1725 by Cleophas Dunker, RN ?Outcome: Progressing ?  ?Problem: Coping: ?Goal: Will identify appropriate support needs ?11/21/2021 1725 by Cleophas Dunker, RN ?Outcome: Progressing ?  ?

## 2021-11-21 NOTE — Progress Notes (Signed)
Occupational Therapy Treatment ?Patient Details ?Name: Ralph Jackson ?MRN: [id number] ?DOB: Aug 16, 1946 ?Today's Date: 11/21/2021 ? ? ?History of present illness Pt is 76yo male who presented to ED with R UE/LE weakness. 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 PMH: tobacco abuse, HTN, hypthyroidism, HLD< GERD, dementia, and ETOH abuse PSH: R THA 10/22. (Simultaneous filing. User may not have seen previous data.) ?  ?OT comments ? Patient received in recliner a agreeable to OT treatment. Patient was min assist to get to EOB with HOB raised. Patient was mod assist to transfer to recliner with RW.  Patient address self care standing at sink with min assist for balance and patient using one extremity to assist with bathing while standing and dressing while seated. Patient making good gains with OT treatment. Patient would benefit from inpatient rehab to increase independence and safety with self care for safe return home with daughter.    ? ?Recommendations for follow up therapy are one component of a multi-disciplinary discharge planning process, led by the attending physician.  Recommendations may be updated based on patient status, additional functional criteria and insurance authorization. ?   ?Follow Up Recommendations ? Acute inpatient rehab (3hours/day)  ?  ?Assistance Recommended at Discharge Frequent or constant Supervision/Assistance  ?Patient can return home with the following ? Assistance with cooking/housework;Direct supervision/assist for medications management;Direct supervision/assist for financial management;Two people to help with walking and/or transfers;A lot of help with bathing/dressing/bathroom;Assist for transportation;Help with stairs or ramp for entrance ?  ?Equipment Recommendations ? Other (comment)  ?  ?Recommendations for Other Services   ? ?  ?Precautions / Restrictions Precautions ?Precautions: Fall (Simultaneous filing. User may not have seen previous data.) ?Precaution  Comments: COVID+ (Simultaneous filing. User may not have seen previous data.) ?Restrictions ?Weight Bearing Restrictions: No (Simultaneous filing. User may not have seen previous data.)  ? ? ?  ? ?Mobility Bed Mobility ?Overal bed mobility: Needs Assistance (Simultaneous filing. User may not have seen previous data.) ?Bed Mobility: Supine to Sit (Simultaneous filing. User may not have seen previous data.) ?  ?  ?Supine to sit: Min assist (Simultaneous filing. User may not have seen previous data.) ?  ?  ?General bed mobility comments: min assist for raising trunk with HOB raised (Simultaneous filing. User may not have seen previous data.) ?  ? ?Transfers ?Overall transfer level: Needs assistance (Simultaneous filing. User may not have seen previous data.) ?Equipment used: Rolling walker (2 wheels) (Simultaneous filing. User may not have seen previous data.) ?Transfers: Sit to/from Stand (Designer, industrial/product. User may not have seen previous data.) ?Sit to Stand: Mod assist (Simultaneous filing. User may not have seen previous data.) ?  ?  ?Step pivot transfers: Mod assist ?  ?  ?General transfer comment: mod assist to power up and balance during transfer (Simultaneous filing. User may not have seen previous data.) ?  ?  ?Balance Overall balance assessment: Needs assistance (Simultaneous filing. User may not have seen previous data.) ?Sitting-balance support: Feet supported, No upper extremity supported (Simultaneous filing. User may not have seen previous data.) ?Sitting balance-Leahy Scale: Fair (Simultaneous filing. User may not have seen previous data.) ?  ?  ?Standing balance support: Bilateral upper extremity supported, During functional activity (Simultaneous filing. User may not have seen previous data.) ?Standing balance-Leahy Scale: Fair (Simultaneous filing. User may not have seen previous data.) ?Standing balance comment: stood at sink with one extremity support while performing grooming and bathing tasks  (Simultaneous filing. User may not have seen previous  data.) ?  ?  ?  ?  ?  ?  ?  ?  ?  ?  ?  ?   ? ?ADL either performed or assessed with clinical judgement  ? ?ADL Overall ADL's : Needs assistance/impaired ?  ?  ?Grooming: Wash/dry hands;Wash/dry face;Minimal assistance;Standing ?Grooming Details (indicate cue type and reason): min assist for balance standing at sink ?Upper Body Bathing: Minimal assistance;Standing ?Upper Body Bathing Details (indicate cue type and reason): performed standing at sink ?Lower Body Bathing: Moderate assistance ?Lower Body Bathing Details (indicate cue type and reason): stood for peri area cleaning with assistance for bottom ?Upper Body Dressing : Minimal assistance;Sitting ?Upper Body Dressing Details (indicate cue type and reason): changed gown ?  ?  ?  ?  ?  ?  ?  ?  ?  ?General ADL Comments: Performed grooming and bathing tasks standing at sink ?  ? ?Extremity/Trunk Assessment Upper Extremity Assessment ?RUE Deficits / Details: generalized weakness compared to L ?RUE Sensation: decreased light touch ?RUE Coordination: decreased fine motor ?  ?  ?  ?  ?  ? ?Vision   ?  ?  ?Perception   ?  ?Praxis   ?  ? ?Cognition Arousal/Alertness: Awake/alert (Simultaneous filing. User may not have seen previous data.) ?Behavior During Therapy: Flat affect (Simultaneous filing. User may not have seen previous data.) ?Overall Cognitive Status: Within Functional Limits for tasks assessed (Simultaneous filing. User may not have seen previous data.) ?  ?  ?  ?  ?  ?  ?  ?  ?  ?  ?  ?  ?  ?  ?  ?  ?General Comments: followed directions well (Simultaneous filing. User may not have seen previous data.) ?  ?  ?   ?Exercises   ? ?  ?Shoulder Instructions   ? ? ?  ?General Comments vss  ? ? ?Pertinent Vitals/ Pain       Pain Assessment ?Pain Assessment: Faces (Simultaneous filing. User may not have seen previous data.) ?Faces Pain Scale: Hurts a little bit (Simultaneous filing. User may not have seen  previous data.) ?Pain Location: R hip (Simultaneous filing. User may not have seen previous data.) ?Pain Descriptors / Indicators: Grimacing ?Pain Intervention(s): Monitored during session ? ?Home Living   ?  ?  ?  ?  ?  ?  ?  ?  ?  ?  ?  ?  ?  ?  ?  ?  ?  ?  ? ?  ?Prior Functioning/Environment    ?  ?  ?  ?   ? ?Frequency ? Min 2X/week  ? ? ? ? ?  ?Progress Toward Goals ? ?OT Goals(current goals can now be found in the care plan section) ? Progress towards OT goals: Progressing toward goals ? ?Acute Rehab OT Goals ?Patient Stated Goal: get better ?OT Goal Formulation: With patient ?Time For Goal Achievement: 11/30/21 ?Potential to Achieve Goals: Good ?ADL Goals ?Pt Will Perform Grooming: with min guard assist;standing ?Pt Will Perform Lower Body Bathing: with min assist;sitting/lateral leans;sit to/from stand ?Pt Will Perform Lower Body Dressing: with min assist;sitting/lateral leans;sit to/from stand ?Pt Will Transfer to Toilet: with min assist;ambulating ?Pt Will Perform Toileting - Clothing Manipulation and hygiene: with min assist;sitting/lateral leans;sit to/from stand ?Additional ADL Goal #1: Pt will complete bed mobility with supervision as a precursor to seated ADL's.  ?Plan Discharge plan remains appropriate   ? ?Co-evaluation ? ? ?   ?  ?  ?  ?  ? ?  ?  AM-PAC OT "6 Clicks" Daily Activity     ?Outcome Measure ? ? Help from another person eating meals?: A Little ?Help from another person taking care of personal grooming?: A Little ?Help from another person toileting, which includes using toliet, bedpan, or urinal?: A Lot ?Help from another person bathing (including washing, rinsing, drying)?: A Lot ?Help from another person to put on and taking off regular upper body clothing?: A Little ?Help from another person to put on and taking off regular lower body clothing?: A Lot ?6 Click Score: 15 ? ?  ?End of Session Equipment Utilized During Treatment: Rolling walker (2 wheels);Gait belt ? ?OT Visit Diagnosis:  Unsteadiness on feet (R26.81);Other abnormalities of gait and mobility (R26.89);Muscle weakness (generalized) (M62.81);Hemiplegia and hemiparesis ?Hemiplegia - Right/Left: Right ?Hemiplegia - dominant/non-dominant:

## 2021-11-21 NOTE — Progress Notes (Signed)
Physical Therapy Treatment ?Patient Details ?Name: Ralph Jackson ?MRN: [id number] ?DOB: May 28, 1946 ?Today's Date: 11/21/2021 ? ? ?History of Present Illness Pt is 76yo male who presented to ED with R UE/LE weakness. 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 PMH: tobacco abuse, HTN, hypthyroidism, HLD< GERD, dementia, and ETOH abuse PSH: R THA 10/22. ? ?  ?PT Comments  ? ? Pt progressing towards all goals. Pt remains to have R LE weakness, trendelenburg gait with inability to achieve R knee terminal knee extension during amb. Focused on sit to stand with weight shift to R to improve strength to improved ability to transfer and ambulate. Pt to continue to benefit from inpatient rehab to achieve safe supervision level of function for safe transition home with daughter. ?  ?Recommendations for follow up therapy are one component of a multi-disciplinary discharge planning process, led by the attending physician.  Recommendations may be updated based on patient status, additional functional criteria and insurance authorization. ? ?Follow Up Recommendations ? Acute inpatient rehab (3hours/day) ?  ?  ?Assistance Recommended at Discharge Frequent or constant Supervision/Assistance  ?Patient can return home with the following A lot of help with walking and/or transfers;A lot of help with bathing/dressing/bathroom;Assistance with cooking/housework;Direct supervision/assist for medications management;Direct supervision/assist for financial management;Assist for transportation;Help with stairs or ramp for entrance ?  ?Equipment Recommendations ? Rolling walker (2 wheels)  ?  ?Recommendations for Other Services Rehab consult ? ? ?  ?Precautions / Restrictions Precautions ?Precautions: Fall ?Precaution Comments: COVID+ ?Restrictions ?Weight Bearing Restrictions: No  ?  ? ?Mobility ? Bed Mobility ?Overal bed mobility: Needs Assistance ?Bed Mobility: Supine to Sit ?  ?  ?Supine to sit: Min assist ?  ?  ?General bed mobility  comments: HOB elevated, pt able to bring LEs off EOB, minA for trunk elevation ?  ? ?Transfers ?Overall transfer level: Needs assistance ?Equipment used: Rolling walker (2 wheels) ?Transfers: Sit to/from Stand ?Sit to Stand: Mod assist, Min assist ?  ?  ?  ?  ?  ?General transfer comment: min/modA with max verbal cues for hand and foot placement in addition to scooting forward ?  ? ?Ambulation/Gait ?Ambulation/Gait assistance: Mod assist ?Gait Distance (Feet): 20 Feet (x2) ?Assistive device: Rolling walker (2 wheels) ?Gait Pattern/deviations: Step-to pattern, Knee flexed in stance - right, Antalgic, Trunk flexed, Narrow base of support, Scissoring (ataxic, near scissoring) ?Gait velocity: slow ?Gait velocity interpretation: <1.8 ft/sec, indicate of risk for recurrent falls ?  ?General Gait Details: pt continues with R trendelenberg gait pattern due to R hip pain and weakness, with tactile cues knee flexion decreased however unable to sustain ? ? ?Stairs ?  ?  ?  ?  ?  ? ? ?Wheelchair Mobility ?  ? ?Modified Rankin (Stroke Patients Only) ?Modified Rankin (Stroke Patients Only) ?Pre-Morbid Rankin Score: Moderate disability ?Modified Rankin: Severe disability ? ? ?  ?Balance Overall balance assessment: Needs assistance ?Sitting-balance support: Feet supported, No upper extremity supported ?Sitting balance-Leahy Scale: Fair ?Sitting balance - Comments: Edge of chair, able to hold balance, not against challenge ?  ?Standing balance support: Bilateral upper extremity supported, During functional activity ?Standing balance-Leahy Scale: Fair ?Standing balance comment: worked on standing at sink to wash face and comb hair. PT student at R knee providing tactile cues to quad in addition to verbal cues to achieve terminal knee extension, no hyperextension, pt only able to sustain for about 5 seconds prior to buckling however was able to stand for 2 bouts of 3-4 min. PT  to help maintain hips forward and weight shift to the R. ?   ?  ?  ?  ?  ?  ?  ?  ?  ?  ?  ?  ? ?  ?Cognition Arousal/Alertness: Awake/alert ?Behavior During Therapy: Flat affect ?Overall Cognitive Status: Within Functional Limits for tasks assessed ?  ?  ?  ?  ?  ?  ?  ?  ?  ?  ?  ?  ?  ?  ?  ?  ?General Comments: pt following simple commands consistently, continues to require increased time for processing ?  ?  ? ?  ?Exercises General Exercises - Lower Extremity ?Long Arc Quad: AROM, Right, 10 reps, Seated ?Other Exercises ?Other Exercises: worked on sit to stand without using L UE and increased weight shift to the R to improve strength in R UE and LE. 6 trials, modA to maintain R weightshift ? ?  ?General Comments General comments (skin integrity, edema, etc.): vss ?  ?  ? ?Pertinent Vitals/Pain Pain Assessment ?Pain Assessment: Faces ?Faces Pain Scale: Hurts a little bit ?Pain Location: R hip  ? ? ?Home Living   ?  ?  ?  ?  ?  ?  ?  ?  ?  ?   ?  ?Prior Function    ?  ?  ?   ? ?PT Goals (current goals can now be found in the care plan section) Acute Rehab PT Goals ?Patient Stated Goal: didn't state ?PT Goal Formulation: With patient ?Time For Goal Achievement: 11/30/21 ?Potential to Achieve Goals: Good ?Progress towards PT goals: Progressing toward goals ? ?  ?Frequency ? ? ? Min 4X/week ? ? ? ?  ?PT Plan Current plan remains appropriate  ? ? ?Co-evaluation   ?  ?  ?  ?  ? ?  ?AM-PAC PT "6 Clicks" Mobility   ?Outcome Measure ? Help needed turning from your back to your side while in a flat bed without using bedrails?: A Little ?Help needed moving from lying on your back to sitting on the side of a flat bed without using bedrails?: A Little ?Help needed moving to and from a bed to a chair (including a wheelchair)?: A Lot ?Help needed standing up from a chair using your arms (e.g., wheelchair or bedside chair)?: A Lot ?Help needed to walk in hospital room?: A Lot ?Help needed climbing 3-5 steps with a railing? : Total ?6 Click Score: 13 ? ?  ?End of Session Equipment  Utilized During Treatment: Gait belt ?Activity Tolerance: Patient tolerated treatment well ?Patient left: in chair;with call bell/phone within reach;with chair alarm set ?Nurse Communication: Mobility status ?PT Visit Diagnosis: Unsteadiness on feet (R26.81);Muscle weakness (generalized) (M62.81);Difficulty in walking, not elsewhere classified (R26.2) ?  ? ? ?Time: 1230-1300 ?PT Time Calculation (min) (ACUTE ONLY): 30 min ? ?Charges:  $Gait Training: 8-22 mins ?$Neuromuscular Re-education: 8-22 mins          ?          ? ?Lewis Shock, PT, DPT ?Acute Rehabilitation Services ?Pager #: [phone] ?Office #: [phone] ? ? ? ?Deone Leifheit M Zoye Chandra ?11/21/2021, 1:38 PM ? ?

## 2021-11-21 NOTE — Progress Notes (Signed)
? ? ? Ralph Jackson ?                                                                            ? ? ?Ralph Jackson, is a 76 y.o. male, DOB - 01/04/1946, [id number] ?Admit date - 11/15/2021    ?Outpatient Primary MD for the patient is Ralph Ginger, PA-C ? ?LOS - 6  days ? ? ? ?Brief summary  ? ?Ralph Jackson is a 76 year old male with past medical history significant for anxiety/depression, hypothyroidism, essential hypertension, GERD, hyperlipidemia, prior history of EtOH abuse in remission who initially presented to [facility] ED on 3/16 complaining of right-sided weakness, right-sided facial droop, poor appetite.  When family was assisting him during breakfast, seem he was drooling and could not keep the boost shake in his mouth.  Due to this concern, family brought him to the ED for further evaluation. ? ?In the ED, temperature 98.9 ?F, HR 57, RR 18, BP 150/77, SPO2 100% on room air.  WBC 9.5, hemoglobin 13.3, platelets 311.  Sodium 139, potassium 4.3, chloride 102, CO2 29, glucose 145, BUN 18, creatinine 0.96.  Lipase 36, AST 24, ALT 16, total bilirubin 0.4, ammonia level 19.  Urinalysis unrevealing.  COVID-19 PCR positive.  Influenza A/B PCR negative.  CT head without contrast with small cortical/subcortical left frontal infarcts likely acute to subacute, chronic infarct and chronic microvascular ischemic changes noted.  Neurology was consulted.  TRH consulted for further evaluation and management of acute versus subacute stroke.  Patient was transferred to [facility] for stroke team evaluation. ? ? ?Assessment & Plan  ? ? ?Assessment and Plan: ?* CVA (cerebral vascular accident) ([place]) ?Patient presenting to the ED with right facial droop, right-sided upper/lower extremity weakness. ?  CT head without contrast with findings of small cortical/subcortical left frontal infarct likely acute to subacute.  ? MR brain without contrast with acute/subacute infarcts left frontal lobe cortex and  white matter, no evidence of hemorrhagic conversion, mass affect or midline shift.   ?MRA head/neck with no intracranial large vessel occlusion, moderate to severe narrowing distal left P3 and narrowing in mid left M1 and multifocal narrowing in the mid to distal left A2 and A3 segments, no hemodynamically significant stenosis in the neck.   ?LDL 90, hemoglobin A1c 5.0.   ?TTE with LVEF 60 to 65%,  with grade 2 diastolic dysfunction. No thrombus identified.  ?--DAPT with Aspirin 325 mg p.o. daily and Plavix 75 mg p.o. daily x 3 months followed by aspirin alone ?--Crestor 20 mg p.o. daily ?-Therapy evaluations recommending CIR ?--Outpatient follow-up [facility] 4 weeks ? ? ?COVID-19 virus infection ?COVID-19 PCR positive on admission.   ?CXR is negative.  ?Pt asymptomatic ?Completed the course of Paxlovid BID  ?--Airborne/contact isolation  to be discontinued on 3/26. ?--Supportive care ? ?HTN (hypertension) ?BP parameters are optimal.  ? ?Hypothyroidism ?Tsh WNL.  ?Continue with synthroid.  ? ?GERD (gastroesophageal reflux disease) ?Stable, on PPI.  ? ?HLD (hyperlipidemia) ?LDL is 90. ?Discharge on crestor.  ? 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  ?No signs of withdrawal.  ? ?Tobacco abuse ?Counseled on need for complete cessation. ? ?Alcohol abuse, in remission ?Currently in remission.  Continue encourage cessation. ? ? ?Depression with anxiety ?--Lexapro 20 mg p.o. daily ? ? ? ? ?  ? ? ?RN Pressure Injury Documentation: ?  ? ?Malnutrition Type: ? ?Nutrition Problem: Severe Malnutrition ?Etiology: social / environmental circumstances ? ? ?Malnutrition Characteristics: ? ?Signs/Symptoms: severe muscle depletion, severe fat depletion ? ? ?Nutrition Interventions: ? ?Interventions: MVI, Ensure Enlive (each supplement provides 350kcal and 20 grams of protein), Liberalize Diet ? ?Estimated body mass index is 19.41 kg/m? as calculated from the following: ?  Height as of 06/21/21: 5\' 1"  (1.549 m). ?  Weight as  of 06/28/21: 46.6 kg. ? ?Code Status: full code.  ?DVT Prophylaxis:  enoxaparin (LOVENOX) injection 30 mg Start: 11/16/21 2200 ?SCD's Start: 11/15/21 1602 ? ? ?Level of Care: Level of care: Telemetry Medical ?Family Communication: none at bedside.  ? ?Disposition Plan:     Remains inpatient appropriate:  CIR placement.  ? ?Procedures:  ?None.  ? ?Consultants:   ?None.  ? ?Antimicrobials:  ? ?Anti-infectives (From admission, onward)  ? ? Start     Dose/Rate Route Frequency Ordered Stop  ? 11/16/21 1215  nirmatrelvir/ritonavir EUA (PAXLOVID) 3 tablet       ? 3 tablet Oral 2 times daily 11/16/21 1122 11/20/21 2236  ? ?  ? ? ? ?Medications ? ?Scheduled Meds: ? aspirin EC  325 mg Oral Daily  ? clopidogrel  75 mg Oral Daily  ? enoxaparin (LOVENOX) injection  30 mg Subcutaneous Q24H  ? escitalopram  20 mg Oral Daily  ? feeding supplement  237 mL Oral BID BM  ? levothyroxine  50 mcg Oral Daily  ? multivitamin with minerals  1 tablet Oral Daily  ? pantoprazole  40 mg Oral Daily  ? rosuvastatin  20 mg Oral Daily  ? ?Continuous Infusions: ?PRN Meds:.acetaminophen **OR** acetaminophen (TYLENOL) oral liquid 160 mg/5 mL **OR** acetaminophen ? ? ? ?Subjective:  ? ?[name] was seen and examined today.  Pt denies any chest pain or sob, no nausea, vomiting .  ? ?Objective:  ? ?Vitals:  ? 11/20/21 2035 11/21/21 0020 11/21/21 0420 11/21/21 1215  ?BP: 110/69 112/64 108/64 113/73  ?Pulse: 80 60 62 66  ?Resp: 20 18 20 20   ?Temp: 98 ?F (36.7 ?C) 98.5 ?F (36.9 ?C) 98.4 ?F (36.9 ?C) 98.1 ?F (36.7 ?C)  ?TempSrc: Oral Oral Oral Oral  ?SpO2: 98% 100% 97% 100%  ? ? ?Intake/Output Summary (Last 24 hours) at 11/21/2021 1649 ?Last data filed at 11/21/2021 0900 ?Gross per 24 hour  ?Intake 240 ml  ?Output 400 ml  ?Net -160 ml  ? ?There were no vitals filed for this visit. ? ? ?Exam ?General exam: Appears calm and comfortable  ?Respiratory system: Clear to auscultation. Respiratory effort normal. ?Cardiovascular system: S1 & S2 heard, RRR. No  JVD, . No pedal edema. ?Gastrointestinal system: Abdomen is nondistended, soft and nontender. Normal bowel sounds heard. ?Central nervous system: Alert and oriented. No focal neurological deficits. ?Extremities: Symmetric 5 x 5 power. ?Skin: No rashes, ?Psychiatry: mood is appropriate.  ? ? ?Data Reviewed:  I have personally reviewed following labs and imaging studies ? ? ?CBC ?Lab Results  ?Component Value Date  ? WBC 8.8 11/21/2021  ? RBC 3.85 (L) 11/21/2021  ? HGB 11.6 (L) 11/21/2021  ? HCT 35.2 (L) 11/21/2021  ? MCV 91.4 11/21/2021  ? MCH 30.1 11/21/2021  ? PLT 276 11/21/2021  ? MCHC 33.0 11/21/2021  ? RDW 15.1 11/21/2021  ? LYMPHSABS 1.6 11/15/2021  ? MONOABS 0.9 11/15/2021  ?  EOSABS 0.0 11/15/2021  ? BASOSABS 0.0 11/15/2021  ? ? ? ?Last metabolic panel ?Lab Results  ?Component Value Date  ? NA 138 11/21/2021  ? K 4.7 11/21/2021  ? CL 101 11/21/2021  ? CO2 29 11/21/2021  ? BUN 20 11/21/2021  ? CREATININE 0.91 11/21/2021  ? GLUCOSE 77 11/21/2021  ? GFRNONAA >60 11/21/2021  ? GFRAA >60 09/13/2019  ? CALCIUM 9.2 11/21/2021  ? PHOS 4.2 11/21/2021  ? PROT 7.2 11/15/2021  ? ALBUMIN 4.1 11/15/2021  ? BILITOT 0.4 11/15/2021  ? ALKPHOS 65 11/15/2021  ? AST 24 11/15/2021  ? ALT 16 11/15/2021  ? ANIONGAP 8 11/21/2021  ? ? ?CBG (last 3)  ?No results for input(s): GLUCAP in the last 72 hours.  ? ? ?Coagulation Profile: ?No results for input(s): INR, PROTIME in the last 168 hours. ? ? ?Radiology Studies: ?No results found. ? ? ? ? ?Hosie Poisson M.D. ?Ralph Jackson ?11/21/2021, 4:49 PM ? ?Available via Epic secure chat 7am-7pm ?After 7 pm, please refer to night coverage provider listed on amion. ? ? ?` ?

## 2021-11-21 NOTE — Plan of Care (Signed)
  Problem: Education: Goal: Knowledge of secondary prevention will improve ([facility] ALL) Outcome: Progressing   Problem: Coping: Goal: Will identify appropriate support needs Outcome: Progressing   Problem: Self-Care: Goal: Ability to participate in self-care as condition permits will improve Outcome: Progressing   

## 2021-11-21 NOTE — Progress Notes (Signed)
Inpatient Rehab Admissions Coordinator:  ? ?Pt continues on isolation through 3/27.  I will call family tomorrow and begin discussions for CIR.  ? ?Estill Dooms, PT, DPT ?Admissions Coordinator ?[phone] ?11/21/21  ?11:30 AM ? ?

## 2021-11-22 NOTE — Progress Notes (Signed)
Inpatient Rehab Admissions Coordinator:  ? ?Spoke to pt's daughter over the phone to review CIR recommendations and discuss goals/expectations of CIR stay.  Reviewed 3 hrs/day of therapy, average length of stay 2 weeks, and physiatry f/u.  Victorino Dike confirms that they can provide supervision at discharge.  I reviewed insurance prior auth process and I will start that today.  Will continue to follow.   ? ?Estill Dooms, PT, DPT ?Admissions Coordinator ?[phone] ?11/22/21  ?1:58 PM ? ?

## 2021-11-22 NOTE — Progress Notes (Signed)
? ? ? [name] ?                                                                            ? ? ?Ralph Jackson, is a 76 y.o. male, DOB - [birth year], [id number] ?Admit date - 11/15/2021    ?Outpatient Primary MD for the patient is [name], PA-C ? ?LOS - 7  days ? ? ? ?Brief summary  ? ?Ralph DORTON is a 76 year old male with past medical history significant for anxiety/depression, hypothyroidism, essential hypertension, GERD, hyperlipidemia, prior history of EtOH abuse in remission who initially presented to [facility] ED on 3/16 complaining of right-sided weakness, right-sided facial droop, poor appetite.  When family was assisting him during breakfast, seem he was drooling and could not keep the boost shake in his mouth.  Due to this concern, family brought him to the ED for further evaluation. ? ?In the ED, temperature 98.9 ?F, HR 57, RR 18, BP 150/77, SPO2 100% on room air.  WBC 9.5, hemoglobin 13.3, platelets 311.  Sodium 139, potassium 4.3, chloride 102, CO2 29, glucose 145, BUN 18, creatinine 0.96.  Lipase 36, AST 24, ALT 16, total bilirubin 0.4, ammonia level 19.  Urinalysis unrevealing.  COVID-19 PCR positive.  Influenza A/B PCR negative.  CT head without contrast with small cortical/subcortical left frontal infarcts likely acute to subacute, chronic infarct and chronic microvascular ischemic changes noted.  Neurology was consulted.  TRH consulted for further evaluation and management of acute versus subacute stroke.  Patient was transferred to [facility] for stroke team evaluation. ? ? ?Assessment & Plan  ? ? ?Assessment and Plan: ?* CVA (cerebral vascular accident) ([facility]) ?Patient presenting to the ED with right facial droop, right-sided upper/lower extremity weakness. ?  CT head without contrast with findings of small cortical/subcortical left frontal infarct likely acute to subacute.  ? MR brain without contrast with acute/subacute infarcts left frontal lobe cortex and  white matter, no evidence of hemorrhagic conversion, mass affect or midline shift.   ?MRA head/neck with no intracranial large vessel occlusion, moderate to severe narrowing distal left P3 and narrowing in mid left M1 and multifocal narrowing in the mid to distal left A2 and A3 segments, no hemodynamically significant stenosis in the neck.   ?LDL 90, hemoglobin A1c 5.0.   ?TTE with LVEF 60 to 65%,  with grade 2 diastolic dysfunction. No thrombus identified.  ?--DAPT with Aspirin 325 mg p.o. daily and Plavix 75 mg p.o. daily x 3 months followed by aspirin alone ?--Crestor 20 mg p.o. daily ?-Therapy evaluations recommending CIR ?--Outpatient follow-up [facility] 4 weeks. ?- remains asymptomatic.  ? ? ?COVID-19 virus infection ?COVID-19 PCR positive on admission.   ?CXR is negative.  ?Pt asymptomatic ?Completed the course of Paxlovid BID  ?Airborne isolation to be discontinued on 3/26.  ? ?HTN (hypertension) ?BP parameters are well controlled.  ? ?Hypothyroidism ?Tsh WNL.  ?Continue with synthroid.  ? ?GERD (gastroesophageal reflux disease) ?Stable, on PPI.  ? ?HLD (hyperlipidemia) ?LDL is 90. ?Discharge on crestor.  ? ?Alcoholic dementia ([facility]) ?No behavioral abnormalities.  ?No signs of withdrawal.  ? ?Tobacco abuse ?Counseled on need for complete cessation. ? ?Alcohol abuse, in remission ?Currently  in remission.  Continue encourage cessation. ? ? ?Depression with anxiety ?--Lexapro 20 mg p.o. daily ? ?  ? ?Malnutrition Type: ? ?Nutrition Problem: Severe Malnutrition ?Etiology: social / environmental circumstances ? ? ?Malnutrition Characteristics: ? ?Signs/Symptoms: severe muscle depletion, severe fat depletion ? ? ?Nutrition Interventions: ? ?Interventions: MVI, Ensure Enlive (each supplement provides 350kcal and 20 grams of protein), Liberalize Diet ? ?Estimated body mass index is 19.41 kg/m? as calculated from the following: ?  Height as of 06/21/21: 5\' 1"  (1.549 m). ?  Weight as of 06/28/21: 46.6 kg. ? ?Code Status:  full code.  ?DVT Prophylaxis:  enoxaparin (LOVENOX) injection 30 mg Start: 11/16/21 2200 ?SCD's Start: 11/15/21 1602 ? ? ?Level of Care: Level of care: Telemetry Medical ?Family Communication: none at bedside.  ? ?Disposition Plan:     Remains inpatient appropriate:  CIR placement.  ? ?Procedures:  ?None.  ? ?Consultants:   ?None.  ? ?Antimicrobials:  ? ?Anti-infectives (From admission, onward)  ? ? Start     Dose/Rate Route Frequency Ordered Stop  ? 11/16/21 1215  nirmatrelvir/ritonavir EUA (PAXLOVID) 3 tablet       ? 3 tablet Oral 2 times daily 11/16/21 1122 11/20/21 2236  ? ?  ? ? ? ?Medications ? ?Scheduled Meds: ? aspirin EC  325 mg Oral Daily  ? clopidogrel  75 mg Oral Daily  ? enoxaparin (LOVENOX) injection  30 mg Subcutaneous Q24H  ? escitalopram  20 mg Oral Daily  ? feeding supplement  237 mL Oral BID BM  ? levothyroxine  50 mcg Oral Daily  ? multivitamin with minerals  1 tablet Oral Daily  ? pantoprazole  40 mg Oral Daily  ? rosuvastatin  20 mg Oral Daily  ? ?Continuous Infusions: ?PRN Meds:.acetaminophen **OR** acetaminophen (TYLENOL) oral liquid 160 mg/5 mL **OR** acetaminophen ? ? ? ?Subjective:  ? ?[name] was seen and examined today. No new complaints today.  ? ?Objective:  ? ?Vitals:  ? 11/21/21 2057 11/22/21 0038 11/22/21 [id number] 11/22/21 0725  ?BP: 102/70 107/62 125/79 133/69  ?Pulse: 88 68 67 66  ?Resp: 20 18 18 16   ?Temp: 98.5 ?F (36.9 ?C) 97.7 ?F (36.5 ?C) 98.1 ?F (36.7 ?C) 97.9 ?F (36.6 ?C)  ?TempSrc: Oral Oral Oral Oral  ?SpO2: 99% 99% 100% 100%  ? ? ?Intake/Output Summary (Last 24 hours) at 11/22/2021 1232 ?Last data filed at 11/22/2021 0510 ?Gross per 24 hour  ?Intake 240 ml  ?Output 650 ml  ?Net -410 ml  ? ? ?There were no vitals filed for this visit. ? ? ?Exam ?General exam: Appears calm and comfortable  ?Respiratory system: Clear to auscultation. Respiratory effort normal. ?Cardiovascular system: S1 & S2 heard, RRR. No JVD, No pedal edema. ?Gastrointestinal system: Abdomen is  nondistended, soft and nontender. Normal bowel sounds heard. ?Central nervous system: Alert and oriented. No focal neurological deficits. ?Extremities: Symmetric 5 x 5 power. ?Skin: No rashes, lesions or ulcers ?Psychiatry:  Mood & affect appropriate.  ? ? ?Data Reviewed:  I have personally reviewed following labs and imaging studies ? ? ?CBC ?Lab Results  ?Component Value Date  ? WBC 8.8 11/21/2021  ? RBC 3.85 (L) 11/21/2021  ? HGB 11.6 (L) 11/21/2021  ? HCT 35.2 (L) 11/21/2021  ? MCV 91.4 11/21/2021  ? MCH 30.1 11/21/2021  ? PLT 276 11/21/2021  ? MCHC 33.0 11/21/2021  ? RDW 15.1 11/21/2021  ? LYMPHSABS 1.6 11/15/2021  ? MONOABS 0.9 11/15/2021  ? EOSABS 0.0 11/15/2021  ? BASOSABS 0.0 11/15/2021  ? ? ? ?  Last metabolic panel ?Lab Results  ?Component Value Date  ? NA 138 11/21/2021  ? K 4.7 11/21/2021  ? CL 101 11/21/2021  ? CO2 29 11/21/2021  ? BUN 20 11/21/2021  ? CREATININE 0.91 11/21/2021  ? GLUCOSE 77 11/21/2021  ? GFRNONAA >60 11/21/2021  ? GFRAA >60 09/13/2019  ? CALCIUM 9.2 11/21/2021  ? PHOS 4.2 11/21/2021  ? PROT 7.2 11/15/2021  ? ALBUMIN 4.1 11/15/2021  ? BILITOT 0.4 11/15/2021  ? ALKPHOS 65 11/15/2021  ? AST 24 11/15/2021  ? ALT 16 11/15/2021  ? ANIONGAP 8 11/21/2021  ? ? ?CBG (last 3)  ?No results for input(s): GLUCAP in the last 72 hours.  ? ? ?Coagulation Profile: ?No results for input(s): INR, PROTIME in the last 168 hours. ? ? ?Radiology Studies: ?No results found. ? ? ? ? ?Kathlen Mody M.D. ?[name] ?11/22/2021, 12:32 PM ? ?Available via Epic secure chat 7am-7pm ?After 7 pm, please refer to night coverage provider listed on amion. ? ? ?` ?

## 2021-11-22 NOTE — PMR Pre-admission (Signed)
PMR Admission Coordinator Pre-Admission Assessment ? ?Patient: Ralph Jackson is an 76 y.o., male ?MRN: [id number] ?DOB: 03-Nov-1945 ?Height:   ?Weight:   ? ?Insurance Information ?HMO: yes    PPO:      PCP:      IPA:      80/20:      OTHER:  ?PRIMARY: [employer] Medicare      Policy#: [id number]      Subscriber: pt ?CM Name: Philandrea      Phone#: [phone] opt [phone]     Fax#: [phone] ?Pre-Cert#: [id number] auth for CIR provided by Philandrea at [facility] with updates due to fax listed above on 3/30.      Employer:  ?Benefits:  Phone #: [phone]     Name:  ?Eff. Date: 09/02/21     Deduct: $0      Out of Pocket Max: $8300      Life Max: n/a ?CIR: $1556/admit      SNF: 20 full days ?Outpatient: 80%     Co-Ins: 20% ?Home Health: 100%      Co-Pay:  ?DME: 80%     Co-Ins: 20% ?Providers:  ?SECONDARY: Medicaid      Policy#: [id number]     Phone#:  ? ?Financial Counselor:       Phone#:  ? ?The ?Data Collection Information Summary? for patients in Inpatient Rehabilitation Facilities with attached ?Privacy Act [place] Records? was provided and verbally reviewed with: Patient and Family ? ?Emergency Contact Information ?Contact Information   ? ? Name Relation Home Work Mobile  ? Brinley,Jennifier Daughter [phone]    ? ?  ? ? 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  Presented to [facility] on 11/15/2021 with acute onset of right side weakness.  Admission chemistries hemoglobin 13.3, platelets 311,000, BUN 18, creatinine 0.96, COVID-19 PCR positive, influenza A/B PCR negative.  Cranial CT scan showed small cortical/subcortical left frontal infarct likely acute to subacute.  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  MRI acute/subacute infarct left frontal lobe cortex and white matter.  No evidence of hemorrhagic conversion.  No mass effect or midline shift.  MRA of head and neck with no intracranial large vessel occlusion.  Neurology follow-up maintained on aspirin 325 mg daily and Plavix 75 mg daily for 90 days then aspirin 81 mg daily alone.  Lovenox added for DVT prophylaxis.  Patient did complete course of Paxlovid twice daily for COVID-19 infection Airborne isolation discontinued 3/26.  Chest x-ray was negative.  Tolerating a regular consistency diet.  Therapy evaluations completed recommendations were for CIR ? ?Complete NIHSS TOTAL: 3 ? ?Patient's medical record from [facility] has been reviewed by the rehabilitation admission coordinator and physician. ? ?Past Medical History  ?Past Medical History:  ?Diagnosis Date  ? Depression   ? ETOH abuse   ? In remission for several years now  ? Hypertension   ? Urinary tract infection   ? ? ?Has the patient had major surgery during 100 days prior to admission? No ? ?Family History   ?family history is not on file. ? ?Current Medications ? ?Current Facility-Administered Medications:  ?  acetaminophen (TYLENOL) tablet 650 mg, 650 mg, Oral, Q4H PRN **OR** acetaminophen (TYLENOL) 160  MG/5ML solution 650 mg, 650 mg, Per Tube, Q4H PRN **OR** acetaminophen (TYLENOL) suppository 650 mg, 650 mg, Rectal, Q4H PRN, Marylyn Ishihara, Tyrone A, DO ?  aspirin EC tablet 325 mg, 325 mg, Oral, Daily, Rosalin Hawking, MD, 325 mg at 11/22/21 1044 ?  clopidogrel (PLAVIX) tablet 75 mg, 75 mg, Oral, Daily, Corky Sox, MD, 75 mg at 11/22/21 1044 ?  enoxaparin (LOVENOX) injection 30 mg, 30 mg, Subcutaneous, Q24H, Jones, Jessica L, NP, 30 mg at 11/21/21 2104 ?  escitalopram (LEXAPRO) tablet 20 mg, 20 mg, Oral, Daily, Kyle, Tyrone A, DO, 20 mg at 11/22/21 1044 ?  feeding supplement (ENSURE ENLIVE / ENSURE PLUS) liquid 237 mL,  237 mL, Oral, BID BM, [place] Donnamarie Poag, DO, 237 mL at 11/22/21 1428 ?  levothyroxine (SYNTHROID) tablet 50 mcg, 50 mcg, Oral, Daily, Kyle, Tyrone A, DO, 50 mcg at 11/22/21 [id number] ?  multivitamin with minerals tablet 1 tablet, 1 tablet, Oral, Daily, [place] Donnamarie Poag, DO, 1 tablet at 11/22/21 1044 ?  pantoprazole (PROTONIX) EC tablet 40 mg, 40 mg, Oral, Daily, [place] Donnamarie Poag, DO, 40 mg at 11/22/21 1043 ?  rosuvastatin (CRESTOR) tablet 20 mg, 20 mg, Oral, Daily, Rosalin Hawking, MD, 20 mg at 11/22/21 1044 ? ?Patients Current Diet:  ?Diet Order   ? ?       ?  Diet regular Room service appropriate? Yes with Assist; Fluid consistency: Thin  Diet effective now       ?  ? ?  ?  ? ?  ? ? ?Precautions / Restrictions ?Precautions ?Precautions: Fall ?Precaution Comments: COVID+ ?Restrictions ?Weight Bearing Restrictions: No  ? ?Has the patient had 2 or more falls or a fall with injury in the past year? Yes ? ?Prior Activity Level ?Household: per daughter, no DME used at baseline but requiring supervision for ADLs/mobility, difficulty on the stairs baseline ? ?Prior Functional Level ?Self Care: Did the patient need help bathing, dressing, using the toilet or eating? Needed some help ? ?Indoor Mobility: Did the patient need assistance with walking from room to room (with or without device)? Needed some help ? ?Stairs: Did the patient need assistance with internal or external stairs (with or without device)? Needed some help ? ?Functional Cognition: Did the patient need help planning regular tasks such as shopping or remembering to take medications? Needed some help ? ?Patient Information ?Are you of Hispanic, Latino/a,or Spanish origin?: A. No, not of Hispanic, Latino/a, or Spanish origin ?What is your race?: A. White ?Do you need or want an interpreter to communicate with a doctor or health care staff?: 0. No ? ?Patient's Response To:  ?Health Literacy and Transportation ?Is the patient able to respond to health literacy and  transportation needs?: Yes ?Health Literacy - How often do you need to have someone help you when you read instructions, pamphlets, or other written material from your doctor or pharmacy?: Sometimes ?In the past 12 months, has lack of transportation kept you from medical appointments or from getting medications?: No ?In the past 12 months, has lack of transportation kept you from meetings, work, or from getting things needed for daily living?: No ? ?Home Assistive Devices / Equipment ?Home Assistive Devices/Equipment: None ?Home Equipment: Conservation officer, nature (2 wheels), Wheelchair - manual, Tub bench ? ?Prior Device Use: Indicate devices/aids used by the patient prior to current illness, exacerbation or injury? Walker ? ?Current Functional Level ?Cognition ? Arousal/Alertness: Awake/alert ?Overall Cognitive Status: Within Functional Limits for tasks assessed ?Orientation Level: Oriented X4 ?General Comments:  followed directions well (Simultaneous filing. User may not have seen previous data.) ?Attention: Sustained ?Sustained Attention: Appears intact ?Memory: Appears intact ?   ?Extremity Assessment ?(includes Sensation/Coordination) ? Upper Extremity Assessment: RUE deficits/detail ?RUE Deficits / Details: generalized weakness compared to L ?RUE Sensation: decreased light touch ?RUE Coordination: decreased fine motor  ?Lower Extremity Assessment: Defer to PT evaluation ?RLE Deficits / Details: generalized weakness compared to L, hold in slight R hip and knee flexion  ?  ?ADLs ? Overall ADL's : Needs assistance/impaired ?Eating/Feeding: Set up, Sitting ?Grooming: Wash/dry hands, Wash/dry face, Minimal assistance, Standing ?Grooming Details (indicate cue type and reason): min assist for balance standing at sink ?Upper Body Bathing: Minimal assistance, Standing ?Upper Body Bathing Details (indicate cue type and reason): performed standing at sink ?Lower Body Bathing: Moderate assistance ?Lower Body Bathing Details  (indicate cue type and reason): stood for peri area cleaning with assistance for bottom ?Upper Body Dressing : Minimal assistance, Sitting ?Upper Body Dressing Details (indicate cue type and reason): changed gown ?Lower Body

## 2021-11-22 NOTE — Progress Notes (Signed)
Physical Therapy Treatment ?Patient Details ?Name: Ralph Jackson ?MRN: [id number] ?DOB: December 03, 1945 ?Today's Date: 11/22/2021 ? ? ?History of Present Illness Pt is 76yo male who presented to ED with R UE/LE weakness. 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 PMH: tobacco abuse, HTN, hypthyroidism, HLD< GERD, dementia, and ETOH abuse PSH: R THA 10/22. ? ?  ?PT Comments  ? ? Patient is progressing towards goals. Patient required min A to mod assist with functional mobility today, with multimodal cues for hand placement and R quad activation with ambulation. Pt was unable to sustain gait pattern without multimodal cues for step length and knee extension in stance phase on the RLE. Pt benefited from trial of youth walker with improved gait mechanics, however, still had difficulty sustaining R knee extension with ambulation. Pt will benefit from continued skilled PT to maximize pt's safety and independence with functional mobility. ?   ?Recommendations for follow up therapy are one component of a multi-disciplinary discharge planning process, led by the attending physician.  Recommendations may be updated based on patient status, additional functional criteria and insurance authorization. ? ?Follow Up Recommendations ? Acute inpatient rehab (3hours/day) ?  ?  ?Assistance Recommended at Discharge Frequent or constant Supervision/Assistance  ?Patient can return home with the following A lot of help with walking and/or transfers;A lot of help with bathing/dressing/bathroom;Assistance with cooking/housework;Direct supervision/assist for medications management;Direct supervision/assist for financial management;Assist for transportation;Help with stairs or ramp for entrance ?  ?Equipment Recommendations ? Rolling walker (2 wheels) (youth)  ?  ?Recommendations for Other Services Rehab consult ? ? ?  ?Precautions / Restrictions Precautions ?Precautions: Fall ?Precaution Comments: COVID+ ?Restrictions ?Weight Bearing  Restrictions: No  ?  ? ?Mobility ? Bed Mobility ?Overal bed mobility: Needs Assistance ?Bed Mobility: Supine to Sit ?  ?  ?Supine to sit: Min assist ?  ?  ?General bed mobility comments: min A for trunk elevation with HOB elevated. Requried cues to scoot hips to EOB ?  ? ?Transfers ?Overall transfer level: Needs assistance ?Equipment used: Rolling walker (2 wheels) (staright back chair with SPT facilitation) ?Transfers: Sit to/from Stand ?Sit to Stand: Mod assist ?  ?  ?  ?  ?  ?General transfer comment: mod A to power up with SPT facilitation for R lateral lean and using R UE/LE to push up to a stand x4. Pt still requiring multimodal cues for turning, backing up, and lowering to a chair safely; pt falling posteriorly and leaning heavily into SPT with decreased control whiling back up; not pivoting enough to land in chair without mod A for lowering and hip direction ?  ? ?Ambulation/Gait ?Ambulation/Gait assistance: Min assist ?  ?Assistive device: Rolling walker (2 wheels) (youth RW) ?Gait Pattern/deviations: Step-to pattern, Knee flexed in stance - right, Antalgic, Trunk flexed, Narrow base of support, Scissoring ?Gait velocity: slow ?Gait velocity interpretation: <1.8 ft/sec, indicate of risk for recurrent falls ?  ?General Gait Details: pt continues with R trendelenberg gait pattern due to R hip pain and weakness, with tactile cues knee flexion decreased however unable to sustain. Pt requiring multimodal cues and max verbal cues to step forward, straigthen R knee, then step with left during ambulation. If cues stopped, pt unable to sustain ? ? ?Stairs ?  ?  ?  ?  ?  ? ? ?Wheelchair Mobility ?  ? ?Modified Rankin (Stroke Patients Only) ?Modified Rankin (Stroke Patients Only) ?Pre-Morbid Rankin Score: Moderate disability ? ? ?  ?Balance Overall balance assessment: Needs assistance ?Sitting-balance support: Feet  supported, No upper extremity supported ?Sitting balance-Leahy Scale: Fair ?Sitting balance - Comments:  Edge of chair, able to hold balance, not against challenge ?  ?Standing balance support: Bilateral upper extremity supported, During functional activity ?Standing balance-Leahy Scale: Fair ?Standing balance comment: Pt required BUE support in standing using youth RW ?  ?  ?  ?  ?  ?  ?  ?  ?  ?  ?  ?  ? ?  ?Cognition Arousal/Alertness: Awake/alert ?Behavior During Therapy: Flat affect ?Overall Cognitive Status: Within Functional Limits for tasks assessed ?  ?  ?  ?  ?  ?  ?  ?  ?  ?  ?  ?  ?  ?  ?  ?  ?  ?  ?  ? ?  ?Exercises General Exercises - Lower Extremity ?Long Arc Quad: AROM, 5 reps, Seated (w/ verbal cues to straighten R knee) ?Other Exercises ?Other Exercises: worked on sit to stand without using L UE and increased weight shift to the R to improve strength in R UE and LE. 4 trials, modA to maintain R weightshift ? ?  ?General Comments General comments (skin integrity, edema, etc.): VSS ?  ?  ? ?Pertinent Vitals/Pain Pain Assessment ?Pain Assessment: Faces ?Faces Pain Scale: No hurt  ? ? ?Home Living   ?  ?  ?  ?  ?  ?  ?  ?  ?  ?   ?  ?Prior Function    ?  ?  ?   ? ?PT Goals (current goals can now be found in the care plan section) Acute Rehab PT Goals ?Patient Stated Goal: did not state ?PT Goal Formulation: With patient ?Time For Goal Achievement: 11/30/21 ?Potential to Achieve Goals: Good ?Progress towards PT goals: Progressing toward goals ? ?  ?Frequency ? ? ? Min 4X/week ? ? ? ?  ?PT Plan Current plan remains appropriate  ? ? ?Co-evaluation   ?  ?  ?  ?  ? ?  ?AM-PAC PT "6 Clicks" Mobility   ?Outcome Measure ? Help needed turning from your back to your side while in a flat bed without using bedrails?: A Little ?Help needed moving from lying on your back to sitting on the side of a flat bed without using bedrails?: A Little ?Help needed moving to and from a bed to a chair (including a wheelchair)?: A Lot ?Help needed standing up from a chair using your arms (e.g., wheelchair or bedside chair)?: A  Lot ?Help needed to walk in hospital room?: A Lot ?Help needed climbing 3-5 steps with a railing? : Total ?6 Click Score: 13 ? ?  ?End of Session Equipment Utilized During Treatment: Gait belt ?Activity Tolerance: Patient tolerated treatment well ?Patient left: in chair;with call bell/phone within reach;with chair alarm set ?Nurse Communication: Mobility status ?PT Visit Diagnosis: Unsteadiness on feet (R26.81);Muscle weakness (generalized) (M62.81);Difficulty in walking, not elsewhere classified (R26.2) ?  ? ? ?Time: 0932-1000 ?PT Time Calculation (min) (ACUTE ONLY): 28 min ? ?Charges:  $Gait Training: 8-22 mins ?$Therapeutic Exercise: 8-22 mins          ?          ? ?Jonne Ply, SPT ? ? ?Jonne Ply ?11/22/2021, 12:22 PM ? ?

## 2021-11-23 NOTE — Progress Notes (Signed)
Occupational Therapy Treatment ?Patient Details ?Name: Ralph Jackson ?MRN: [id number] ?DOB: May 01, 1946 ?Today's Date: 11/23/2021 ? ? ?History of present illness Pt is 76yo male who presented to ED with R UE/LE weakness. 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 PMH: tobacco abuse, HTN, hypthyroidism, HLD< GERD, dementia, and ETOH abuse PSH: R THA 10/22. ?  ?OT comments ? Patient making good gains with OT treatment with min guard assist while standing at sink for grooming, min assist for toilet transfers with use or grab bars and able to assist with toilet hygiene. Patient is a good candidate for AIR for increased rehab to increase independence and safety with self care.   ? ?Recommendations for follow up therapy are one component of a multi-disciplinary discharge planning process, led by the attending physician.  Recommendations may be updated based on patient status, additional functional criteria and insurance authorization. ?   ?Follow Up Recommendations ? Acute inpatient rehab (3hours/day)  ?  ?Assistance Recommended at Discharge Frequent or constant Supervision/Assistance  ?Patient can return home with the following ? Assistance with cooking/housework;Direct supervision/assist for medications management;Direct supervision/assist for financial management;Two people to help with walking and/or transfers;A lot of help with bathing/dressing/bathroom;Assist for transportation;Help with stairs or ramp for entrance ?  ?Equipment Recommendations ? Other (comment) (TBD)  ?  ?Recommendations for Other Services   ? ?  ?Precautions / Restrictions Precautions ?Precautions: Fall ?Precaution Comments: COVID+ ?Restrictions ?Weight Bearing Restrictions: No  ? ? ?  ? ?Mobility Bed Mobility ?Overal bed mobility: Needs Assistance ?Bed Mobility: Sit to Supine ?  ?  ?  ?Sit to supine: Supervision ?  ?General bed mobility comments: increased time to perform ?  ? ?Transfers ?Overall transfer level: Needs assistance ?Equipment  used: Rolling walker (2 wheels) ?Transfers: Sit to/from Stand ?Sit to Stand: Min assist ?  ?  ?Step pivot transfers: Min assist, Mod assist ?  ?  ?General transfer comment: min/mod assist for step pivot to eob ?  ?  ?Balance Overall balance assessment: Needs assistance ?Sitting-balance support: Feet supported, No upper extremity supported ?Sitting balance-Leahy Scale: Fair ?  ?  ?Standing balance support: Bilateral upper extremity supported, During functional activity, Reliant on assistive device for balance ?Standing balance-Leahy Scale: Poor ?Standing balance comment: reliant on device for balance ?  ?  ?  ?  ?  ?  ?  ?  ?  ?  ?  ?   ? ?ADL either performed or assessed with clinical judgement  ? ?ADL Overall ADL's : Needs assistance/impaired ?  ?  ?Grooming: Wash/dry hands;Wash/dry face;Min guard;Standing ?Grooming Details (indicate cue type and reason): min guard assist for balance ?  ?  ?  ?  ?  ?  ?  ?  ?Toilet Transfer: Minimal assistance;Grab bars;Rolling walker (2 wheels) ?Toilet Transfer Details (indicate cue type and reason): ambulated to toilet and used rail to lower ?Toileting- Clothing Manipulation and Hygiene: Moderate assistance ?Toileting - Clothing Manipulation Details (indicate cue type and reason): requried assistance to complete ?  ?  ?  ?General ADL Comments: cues for safety ?  ? ?Extremity/Trunk Assessment Upper Extremity Assessment ?RUE Deficits / Details: generalized weakness compared to L ?RUE Sensation: decreased light touch ?RUE Coordination: decreased fine motor ?  ?  ?  ?  ?  ? ?Vision   ?  ?  ?Perception   ?  ?Praxis   ?  ? ?Cognition Arousal/Alertness: Awake/alert ?Behavior During Therapy: Flat affect ?Overall Cognitive Status: Within Functional Limits for tasks assessed ?  ?  ?  ?  ?  ?  ?  ?  ?  ?  ?  ?  ?  ?  ?  ?  ?  General Comments: cues for safety ?  ?  ?   ?Exercises   ? ?  ?Shoulder Instructions   ? ? ?  ?General Comments    ? ? ?Pertinent Vitals/ Pain       Pain  Assessment ?Pain Assessment: No/denies pain ?Pain Intervention(s): Monitored during session ? ?Home Living   ?  ?  ?  ?  ?  ?  ?  ?  ?  ?  ?  ?  ?  ?  ?  ?  ?  ?  ? ?  ?Prior Functioning/Environment    ?  ?  ?  ?   ? ?Frequency ? Min 2X/week  ? ? ? ? ?  ?Progress Toward Goals ? ?OT Goals(current goals can now be found in the care plan section) ? Progress towards OT goals: Progressing toward goals ? ?Acute Rehab OT Goals ?Patient Stated Goal: go to rehab ?OT Goal Formulation: With patient ?Time For Goal Achievement: 11/30/21 ?Potential to Achieve Goals: Good ?ADL Goals ?Pt Will Perform Grooming: with min guard assist;standing ?Pt Will Perform Lower Body Bathing: with min assist;sitting/lateral leans;sit to/from stand ?Pt Will Perform Lower Body Dressing: with min assist;sitting/lateral leans;sit to/from stand ?Pt Will Transfer to Toilet: with min assist;ambulating ?Pt Will Perform Toileting - Clothing Manipulation and hygiene: with min assist;sitting/lateral leans;sit to/from stand ?Additional ADL Goal #1: Pt will complete bed mobility with supervision as a precursor to seated ADL's.  ?Plan Discharge plan remains appropriate   ? ?Co-evaluation ? ? ?   ?  ?  ?  ?  ? ?  ?AM-PAC OT "6 Clicks" Daily Activity     ?Outcome Measure ? ? Help from another person eating meals?: A Little ?Help from another person taking care of personal grooming?: A Little ?Help from another person toileting, which includes using toliet, bedpan, or urinal?: A Lot ?Help from another person bathing (including washing, rinsing, drying)?: A Lot ?Help from another person to put on and taking off regular upper body clothing?: A Little ?Help from another person to put on and taking off regular lower body clothing?: A Lot ?6 Click Score: 15 ? ?  ?End of Session Equipment Utilized During Treatment: Rolling walker (2 wheels);Gait belt ? ?OT Visit Diagnosis: Unsteadiness on feet (R26.81);Other abnormalities of gait and mobility (R26.89);Muscle weakness  (generalized) (M62.81);Hemiplegia and hemiparesis ?Hemiplegia - Right/Left: Right ?Hemiplegia - dominant/non-dominant: Dominant ?Hemiplegia - caused by: Cerebral infarction ?  ?Activity Tolerance Patient tolerated treatment well ?  ?Patient Left in bed;with call bell/phone within reach;with bed alarm set ?  ?Nurse Communication Mobility status ?  ? ?   ? ?Time: 4132-4401 ?OT Time Calculation (min): 23 min ? ?Charges: OT General Charges ?$OT Visit: 1 Visit ?OT Treatments ?$Self Care/Home Management : 23-37 mins ? ?Alfonse Flavors, OTA ?Acute Rehabilitation Services  ?Pager [phone] ?Office [phone] ? ? ?Haylyn Halberg Jeannett Senior ?11/23/2021, 3:52 PM ?

## 2021-11-23 NOTE — Progress Notes (Signed)
Nutrition Follow-up ? ?DOCUMENTATION CODES:  ?Severe malnutrition in context of social or environmental circumstances ? ?INTERVENTION:  ?-obtain new measured weight ?-continue Ensure Enlive po BID, each supplement provides 350 kcal and 20 grams of protein. ?-continue MVI with minerals daily ?-continue regular diet ?-continue to encourage PO intake ? ?NUTRITION DIAGNOSIS:  ?Severe Malnutrition related to social / environmental circumstances as evidenced by severe muscle depletion, severe fat depletion. -- ongoing ? ?GOAL:  ?Patient will meet greater than or equal to 90% of their needs -- progressing ? ?MONITOR:  ?PO intake, Supplement acceptance, Labs, Weight trends ? ?REASON FOR ASSESSMENT:  ?Consult ?Assessment of nutrition requirement/status ? ?ASSESSMENT:  ?76 y.o. male presented to the ED with R side weakness. PMH includes HTN, GERD, EtOH abuse in remission, and malnutrition.Pt admitted with a CVA and incidental positive COVID test. ? ?Per Neurology, pt remains asymptomatic. Pending discharge to CIR. Airborne isolation precautions to be discontinued on 3/26.  ? ?Pt doing well with meals and with ONS. Recommend continue current nutrition plan of care.  ? ?PO Intake: 25-100% x last 8 recorded meals (70% avg meal intake) ? ?UOP: x24 hours ?I/O: - since admit ? ?Updated weight was not obtained. RD will order new measured weight.  ? ?Labs reviewed. ?Medications: ? feeding supplement  237 mL Oral BID BM  ? multivitamin with minerals  1 tablet Oral Daily  ? pantoprazole  40 mg Oral Daily  ? ?Diet Order:   ?Diet Order   ? ?       ?  Diet regular Room service appropriate? Yes with Assist; Fluid consistency: Thin  Diet effective now       ?  ? ?  ?  ? ?  ? ?EDUCATION NEEDS:  ?No education needs have been identified at this time ? ?Skin:  Skin Assessment: Reviewed RN Assessment ? ?Last BM:  3/21 ? ?Height:  ?Ht Readings from Last 1 Encounters:  ?06/21/21 5\' 1"  (1.549 m)  ? ?Weight:  ?Wt Readings from Last 1  Encounters:  ?06/28/21 46.6 kg  ? ?Ideal Body Weight:  50.9 kg ? ?BMI:  There is no height or weight on file to calculate BMI. ? ?Estimated Nutritional Needs:  ?Kcal:  1400-1600 ?Protein:  70-85 grams ?Fluid:  >/= 1.5 L ? ? ?06/30/21., MS, RD, LDN (she/her/hers) ?RD pager number and weekend/on-call pager number located in Amion. ?

## 2021-11-23 NOTE — Progress Notes (Signed)
? ? ? [name] ?                                                                            ? ? ?Ralph Jackson, is a 76 y.o. male, DOB - 07-Nov-1945, [id number] ?Admit date - 11/15/2021    ?Outpatient Primary MD for the patient is [name], PA-C ? ?LOS - 8  days ? ? ? ?Brief summary  ? ?Ralph Jackson is a 76 year old male with past medical history significant for anxiety/depression, hypothyroidism, essential hypertension, GERD, hyperlipidemia, prior history of EtOH abuse in remission who initially presented to [facility] ED on 3/16 complaining of right-sided weakness, right-sided facial droop, poor appetite.  When family was assisting him during breakfast, seem he was drooling and could not keep the boost shake in his mouth.  Due to this concern, family brought him to the ED for further evaluation. ? ?In the ED, temperature 98.9 ?F, HR 57, RR 18, BP 150/77, SPO2 100% on room air.  WBC 9.5, hemoglobin 13.3, platelets 311.  Sodium 139, potassium 4.3, chloride 102, CO2 29, glucose 145, BUN 18, creatinine 0.96.  Lipase 36, AST 24, ALT 16, total bilirubin 0.4, ammonia level 19.  Urinalysis unrevealing.  COVID-19 PCR positive.  Influenza A/B PCR negative.  CT head without contrast with small cortical/subcortical left frontal infarcts likely acute to subacute, chronic infarct and chronic microvascular ischemic changes noted.  Neurology was consulted.  TRH consulted for further evaluation and management of acute versus subacute stroke.  Patient was transferred to [facility] for stroke team evaluation. ? ? ?Assessment & Plan  ? ? ?Assessment and Plan: ?* CVA (cerebral vascular accident) (HCC) ?Patient presenting to the ED with right facial droop, right-sided upper/lower extremity weakness. ?  CT head without contrast with findings of small cortical/subcortical left frontal infarct likely acute to subacute.  ? MR brain without contrast with acute/subacute infarcts left frontal lobe cortex and  white matter, no evidence of hemorrhagic conversion, mass affect or midline shift.   ?MRA head/neck with no intracranial large vessel occlusion, moderate to severe narrowing distal left P3 and narrowing in mid left M1 and multifocal narrowing in the mid to distal left A2 and A3 segments, no hemodynamically significant stenosis in the neck.   ?LDL 90, hemoglobin A1c 5.0.   ?TTE with LVEF 60 to 65%,  with grade 2 diastolic dysfunction. No thrombus identified.  ?--DAPT with Aspirin 325 mg p.o. daily and Plavix 75 mg p.o. daily x 3 months followed by aspirin alone ?--Crestor 20 mg p.o. daily ?-Therapy evaluations recommending CIR ?--Outpatient follow-up [facility] 4 weeks. ?- remains asymptomatic.  ?- pt looking forward to being discharged to inpatient rehab.  ? ? ?COVID-19 virus infection ?COVID-19 PCR positive on admission.   ?CXR is negative.  ?Completed the course of Paxlovid BID  ?Airborne isolation to be discontinued on 3/26.  ?Pt asymptomatic.  ? ?HTN (hypertension) ?BP parameters are optimal.  ? ?Hypothyroidism ?Tsh WNL.  ?Continue with synthroid.  ? ?GERD (gastroesophageal reflux disease) ?Stable, on PPI.  ? ?HLD (hyperlipidemia) ?LDL is 90. ?Discharge on crestor.  ? ?Alcoholic dementia (HCC) ?No behavioral abnormalities.  ?No signs of withdrawal.  ? ?Tobacco abuse ?Counseled  on need for complete cessation. ? ?Alcohol abuse, in remission ?Currently in remission.  Continue encourage cessation. ? ? ?Depression with anxiety ?--Lexapro 20 mg p.o. daily ? ?  ? ?Malnutrition Type: ? ?Nutrition Problem: Severe Malnutrition ?Etiology: social / environmental circumstances ?Nutrition supplementation added.  ? ?Malnutrition Characteristics: ? ?Signs/Symptoms: severe muscle depletion, severe fat depletion ? ? ?Nutrition Interventions: ? ?Interventions: MVI, Ensure Enlive (each supplement provides 350kcal and 20 grams of protein), Liberalize Diet ? ?Estimated body mass index is 19.41 kg/m? as calculated from the following: ?   Height as of 06/21/21: 5\' 1"  (1.549 m). ?  Weight as of 06/28/21: 46.6 kg. ? ?Code Status: full code.  ?DVT Prophylaxis:  enoxaparin (LOVENOX) injection 30 mg Start: 11/16/21 2200 ?SCD's Start: 11/15/21 1602 ? ? ?Level of Care: Level of care: Telemetry Medical ?Family Communication: none at bedside.  ? ?Disposition Plan:     Remains inpatient appropriate:  CIR placement.  ? ?Procedures:  ?None.  ? ?Consultants:   ?None.  ? ?Antimicrobials:  ? ?Anti-infectives (From admission, onward)  ? ? Start     Dose/Rate Route Frequency Ordered Stop  ? 11/16/21 1215  nirmatrelvir/ritonavir EUA (PAXLOVID) 3 tablet       ? 3 tablet Oral 2 times daily 11/16/21 1122 11/20/21 2236  ? ?  ? ? ? ?Medications ? ?Scheduled Meds: ? aspirin EC  325 mg Oral Daily  ? clopidogrel  75 mg Oral Daily  ? enoxaparin (LOVENOX) injection  30 mg Subcutaneous Q24H  ? escitalopram  20 mg Oral Daily  ? feeding supplement  237 mL Oral BID BM  ? levothyroxine  50 mcg Oral Daily  ? multivitamin with minerals  1 tablet Oral Daily  ? pantoprazole  40 mg Oral Daily  ? rosuvastatin  20 mg Oral Daily  ? ?Continuous Infusions: ?PRN Meds:.acetaminophen **OR** acetaminophen (TYLENOL) oral liquid 160 mg/5 mL **OR** acetaminophen ? ? ? ?Subjective:  ? ?Ralph Jackson was seen and examined today.  No new complaints today ? ?Objective:  ? ?Vitals:  ? 11/23/21 0258 11/23/21 0914 11/23/21 1346 11/23/21 1537  ?BP: 137/80 125/85 129/81 106/70  ?Pulse: 64 78 (!) 107 81  ?Resp: 18 16 15 18   ?Temp: 97.8 ?F (36.6 ?C) 98.4 ?F (36.9 ?C) 98.5 ?F (36.9 ?C) 98.2 ?F (36.8 ?C)  ?TempSrc: Oral Oral Oral Oral  ?SpO2: 100% 100% 96% 98%  ? ? ?Intake/Output Summary (Last 24 hours) at 11/23/2021 1542 ?Last data filed at 11/23/2021 1405 ?Gross per 24 hour  ?Intake 240 ml  ?Output 950 ml  ?Net -710 ml  ? ? ?There were no vitals filed for this visit. ? ? ?Exam ?General exam: Appears calm and comfortable  ?Respiratory system: Clear to auscultation. Respiratory effort normal. ?Cardiovascular  system: S1 & S2 heard, RRR. No JVD,  No pedal edema. ?Gastrointestinal system: Abdomen is nondistended, soft and nontender.  Normal bowel sounds heard. ?Central nervous system: Alert and oriented. No focal neurological deficits. ?Extremities: Symmetric 5 x 5 power. ?Skin: No rashes, lesions or ulcers ?Psychiatry: Judgement and insight appear normal. Mood & affect appropriate.  ? ? ? ?Data Reviewed:  I have personally reviewed following labs and imaging studies ? ? ?CBC ?Lab Results  ?Component Value Date  ? WBC 8.8 11/21/2021  ? RBC 3.85 (L) 11/21/2021  ? HGB 11.6 (L) 11/21/2021  ? HCT 35.2 (L) 11/21/2021  ? MCV 91.4 11/21/2021  ? MCH 30.1 11/21/2021  ? PLT 276 11/21/2021  ? MCHC 33.0 11/21/2021  ? RDW 15.1  11/21/2021  ? LYMPHSABS 1.6 11/15/2021  ? MONOABS 0.9 11/15/2021  ? EOSABS 0.0 11/15/2021  ? BASOSABS 0.0 11/15/2021  ? ? ? ?Last metabolic panel ?Lab Results  ?Component Value Date  ? NA 138 11/21/2021  ? K 4.7 11/21/2021  ? CL 101 11/21/2021  ? CO2 29 11/21/2021  ? BUN 20 11/21/2021  ? CREATININE 0.91 11/21/2021  ? GLUCOSE 77 11/21/2021  ? GFRNONAA >60 11/21/2021  ? GFRAA >60 09/13/2019  ? CALCIUM 9.2 11/21/2021  ? PHOS 4.2 11/21/2021  ? PROT 7.2 11/15/2021  ? ALBUMIN 4.1 11/15/2021  ? BILITOT 0.4 11/15/2021  ? ALKPHOS 65 11/15/2021  ? AST 24 11/15/2021  ? ALT 16 11/15/2021  ? ANIONGAP 8 11/21/2021  ? ? ?CBG (last 3)  ?No results for input(s): GLUCAP in the last 72 hours.  ? ? ?Coagulation Profile: ?No results for input(s): INR, PROTIME in the last 168 hours. ? ? ?Radiology Studies: ?No results found. ? ? ? ? ?Kathlen Mody M.D. ?[name] ?11/23/2021, 3:42 PM ? ?Available via Epic secure chat 7am-7pm ?After 7 pm, please refer to night coverage provider listed on amion. ? ? ?` ?

## 2021-11-23 NOTE — Progress Notes (Signed)
EKG done- patient still very restless; and unable to lay still. Delma Post notified.  ?

## 2021-11-23 NOTE — Progress Notes (Signed)
Physical Therapy Treatment ?Patient Details ?Name: Ralph Jackson ?MRN: [id number] ?DOB: September 06, 1945 ?Today's Date: 11/23/2021 ? ? ?History of Present Illness Pt is 76yo male who presented to ED with R UE/LE weakness. 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 PMH: tobacco abuse, HTN, hypthyroidism, HLD< GERD, dementia, and ETOH abuse PSH: R THA 10/22. ? ?  ?PT Comments  ? ? Pt making steady progress. Needs continued rt hip strengthening secondary to CVA as well as likely weakness after THR in October.    ?Recommendations for follow up therapy are one component of a multi-disciplinary discharge planning process, led by the attending physician.  Recommendations may be updated based on patient status, additional functional criteria and insurance authorization. ? ?Follow Up Recommendations ? Acute inpatient rehab (3hours/day) ?  ?  ?Assistance Recommended at Discharge Frequent or constant Supervision/Assistance  ?Patient can return home with the following A lot of help with walking and/or transfers;A lot of help with bathing/dressing/bathroom;Assistance with cooking/housework;Direct supervision/assist for medications management;Direct supervision/assist for financial management;Assist for transportation;Help with stairs or ramp for entrance ?  ?Equipment Recommendations ? Rolling walker (2 wheels) (youth)  ?  ?Recommendations for Other Services Rehab consult ? ? ?  ?Precautions / Restrictions Precautions ?Precautions: Fall ?Precaution Comments: COVID+ ?Restrictions ?Weight Bearing Restrictions: No  ?  ? ?Mobility ? Bed Mobility ?Overal bed mobility: Needs Assistance ?Bed Mobility: Supine to Sit ?  ?  ?Supine to sit: Supervision ?  ?  ?General bed mobility comments: Incr time and effort but no physical assist ?  ? ?Transfers ?Overall transfer level: Needs assistance ?Equipment used: Rolling walker (2 wheels) ?Transfers: Sit to/from Stand ?Sit to Stand: Min assist, Mod assist ?  ?  ?  ?  ?  ?General transfer  comment: Min assist for stability when standing and incr time and effort. Mod assist for stand to sit as despite verbal cues pt doesn't turn all the way around before sitting down and needs mod assist to push hips to complete turn to land in chair. ?  ? ?Ambulation/Gait ?Ambulation/Gait assistance: Min assist ?Gait Distance (Feet): 30 Feet (x 2) ?Assistive device: Rolling walker (2 wheels) (youth RW) ?Gait Pattern/deviations: Step-to pattern, Knee flexed in stance - right, Antalgic, Decreased step length - right, Trendelenburg, Trunk flexed ?Gait velocity: decr ?Gait velocity interpretation: <1.8 ft/sec, indicate of risk for recurrent falls ?  ?General Gait Details: Assist for balance and support and maximodal cues to extend through rt knee and hip. Significant trendelenburg - likely due to stroke and hip weakness after THR. ? ? ?Stairs ?  ?  ?  ?  ?  ? ? ?Wheelchair Mobility ?  ? ?Modified Rankin (Stroke Patients Only) ?Modified Rankin (Stroke Patients Only) ?Pre-Morbid Rankin Score: Moderate disability ?Modified Rankin: Moderately severe disability ? ? ?  ?Balance Overall balance assessment: Needs assistance ?Sitting-balance support: Feet supported, No upper extremity supported ?Sitting balance-Leahy Scale: Fair ?  ?  ?Standing balance support: Bilateral upper extremity supported, During functional activity, Reliant on assistive device for balance ?Standing balance-Leahy Scale: Poor ?Standing balance comment: walker and min guard for static standing ?  ?  ?  ?  ?  ?  ?  ?  ?  ?  ?  ?  ? ?  ?Cognition Arousal/Alertness: Awake/alert ?Behavior During Therapy: Flat affect ?Overall Cognitive Status: Within Functional Limits for tasks assessed ?  ?  ?  ?  ?  ?  ?  ?  ?  ?  ?  ?  ?  ?  ?  ?  ?  ?  ?  ? ?  ?  Exercises General Exercises - Lower Extremity ?Hip ABduction/ADduction: Strengthening, Both, 10 reps, Seated (isometric) ? ?  ?General Comments   ?  ?  ? ?Pertinent Vitals/Pain Pain Assessment ?Pain Assessment:  No/denies pain  ? ? ?Home Living   ?  ?  ?  ?  ?  ?  ?  ?  ?  ?   ?  ?Prior Function    ?  ?  ?   ? ?PT Goals (current goals can now be found in the care plan section) Acute Rehab PT Goals ?Patient Stated Goal: did not state ?Progress towards PT goals: Progressing toward goals ? ?  ?Frequency ? ? ? Min 4X/week ? ? ? ?  ?PT Plan Current plan remains appropriate  ? ? ?Co-evaluation   ?  ?  ?  ?  ? ?  ?AM-PAC PT "6 Clicks" Mobility   ?Outcome Measure ? Help needed turning from your back to your side while in a flat bed without using bedrails?: A Little ?Help needed moving from lying on your back to sitting on the side of a flat bed without using bedrails?: A Little ?Help needed moving to and from a bed to a chair (including a wheelchair)?: A Lot ?Help needed standing up from a chair using your arms (e.g., wheelchair or bedside chair)?: A Little ?Help needed to walk in hospital room?: A Lot ?Help needed climbing 3-5 steps with a railing? : Total ?6 Click Score: 14 ? ?  ?End of Session Equipment Utilized During Treatment: Gait belt ?Activity Tolerance: Patient tolerated treatment well ?Patient left: in chair;with call bell/phone within reach;with chair alarm set ?  ?PT Visit Diagnosis: Unsteadiness on feet (R26.81);Muscle weakness (generalized) (M62.81);Difficulty in walking, not elsewhere classified (R26.2) ?  ? ? ?Time: [id number] ?PT Time Calculation (min) (ACUTE ONLY): 15 min ? ?Charges:  $Gait Training: 8-22 mins          ?          ? ?[facility] PT ?Acute Rehabilitation Services ?Pager [phone] ?Office [phone] ? ? ? ?[facility] ?11/23/2021, 1:51 PM ? ?

## 2021-11-23 NOTE — Plan of Care (Signed)
?  Problem: Education: ?Goal: Knowledge of General Education information will improve ?Description: Including pain rating scale, medication(s)/side effects and non-pharmacologic comfort measures ?Outcome: Progressing ?  ?Problem: Health Behavior/Discharge Planning: ?Goal: Ability to manage health-related needs will improve ?Outcome: Progressing ?  ?Problem: Clinical Measurements: ?Goal: Ability to maintain clinical measurements within normal limits will improve ?Outcome: Progressing ?Goal: Will remain free from infection ?Outcome: Progressing ?Goal: Diagnostic test results will improve ?Outcome: Progressing ?Goal: Respiratory complications will improve ?Outcome: Progressing ?Goal: Cardiovascular complication will be avoided ?Outcome: Progressing ?  ?Problem: Activity: ?Goal: Risk for activity intolerance will decrease ?Outcome: Progressing ?  ?Problem: Nutrition: ?Goal: Adequate nutrition will be maintained ?Outcome: Progressing ?  ?Problem: Coping: ?Goal: Level of anxiety will decrease ?Outcome: Progressing ?  ?Problem: Elimination: ?Goal: Will not experience complications related to bowel motility ?Outcome: Progressing ?Goal: Will not experience complications related to urinary retention ?Outcome: Progressing ?  ?Problem: Pain Managment: ?Goal: General experience of comfort will improve ?Outcome: Progressing ?  ?Problem: Safety: ?Goal: Ability to remain free from injury will improve ?Outcome: Progressing ?  ?Problem: Skin Integrity: ?Goal: Risk for impaired skin integrity will decrease ?Outcome: Progressing ?  ?Problem: Education: ?Goal: Knowledge of disease or condition will improve ?Outcome: Progressing ?Goal: Knowledge of secondary prevention will improve (SELECT ALL) ?Outcome: Progressing ?Goal: Knowledge of patient specific risk factors will improve (INDIVIDUALIZE FOR PATIENT) ?Outcome: Progressing ?  ?Problem: Coping: ?Goal: Will verbalize positive feelings about self ?Outcome: Progressing ?Goal: Will  identify appropriate support needs ?Outcome: Progressing ?  ?Problem: Health Behavior/Discharge Planning: ?Goal: Ability to manage health-related needs will improve ?Outcome: Progressing ?  ?Problem: Self-Care: ?Goal: Ability to participate in self-care as condition permits will improve ?Outcome: Progressing ?  ?Problem: Nutrition: ?Goal: Risk of aspiration will decrease ?Outcome: Progressing ?  ?

## 2021-11-23 NOTE — Plan of Care (Signed)
?  Problem: Education: ?Goal: Knowledge of disease or condition will improve ?Outcome: Progressing ?Goal: Knowledge of secondary prevention will improve (SELECT ALL) ?Outcome: Progressing ?Goal: Knowledge of patient specific risk factors will improve (INDIVIDUALIZE FOR PATIENT) ?Outcome: Progressing ?  ?

## 2021-11-24 MED ORDER — ONDANSETRON HCL 4 MG/2ML IJ SOLN
4.0000 mg | Freq: Four times a day (QID) | INTRAMUSCULAR | Status: DC | PRN
  Administered 2021-11-24 – 2021-11-25 (×2): 4 mg via INTRAVENOUS
  Filled 2021-11-24 (×3): qty 2

## 2021-11-24 NOTE — Progress Notes (Signed)
PT Cancellation Note ? ?Patient Details ?Name: Ralph Jackson ?MRN: [id number] ?DOB: 1945/11/07 ? ? ?Cancelled Treatment:    Reason Eval/Treat Not Completed: Patient declined, no reason specified.  Don;t feel like getting up. ?11/24/2021 ? ?Jacinto Halim., PT ?Acute Rehabilitation Services ?[phone]  (pager) ?[phone]  (office) ? ? ?Eliseo Gum Derl Abalos ?11/24/2021, 4:31 PM ?

## 2021-11-24 NOTE — Progress Notes (Signed)
? ? ? [name] ?                                                                            ? ? ?Ralph Jackson, is a 76 y.o. male, DOB - [birth year], [id number] ?Admit date - 11/15/2021    ?Outpatient Primary MD for the patient is Ralph Ginger, PA-C ? ?LOS - 9  days ? ? ? ?Brief summary  ? ?Ralph Jackson is a 76 year old male with past medical history significant for anxiety/depression, hypothyroidism, essential hypertension, GERD, hyperlipidemia, prior history of EtOH abuse in remission who initially presented to [facility] ED on 3/16 complaining of right-sided weakness, right-sided facial droop, poor appetite.  When family was assisting him during breakfast, seem he was drooling and could not keep the boost shake in his mouth.  Due to this concern, family brought him to the ED for further evaluation. ? ?In the ED, temperature 98.9 ?F, HR 57, RR 18, BP 150/77, SPO2 100% on room air.  WBC 9.5, hemoglobin 13.3, platelets 311.  Sodium 139, potassium 4.3, chloride 102, CO2 29, glucose 145, BUN 18, creatinine 0.96.  Lipase 36, AST 24, ALT 16, total bilirubin 0.4, ammonia level 19.  Urinalysis unrevealing.  COVID-19 PCR positive.  Influenza A/B PCR negative.  CT head without contrast with small cortical/subcortical left frontal infarcts likely acute to subacute, chronic infarct and chronic microvascular ischemic changes noted.  Neurology was consulted.  TRH consulted for further evaluation and management of acute versus subacute stroke.  Patient was transferred to [facility] for stroke team evaluation. ? ?Pt stable for discharge to CIR from medical standpoint.  ? ?Assessment & Plan  ? ? ?Assessment and Plan: ?* CVA (cerebral vascular accident) ([facility]) ?Patient presenting to the ED with right facial droop, right-sided upper/lower extremity weakness. ?  CT head without contrast with findings of small cortical/subcortical left frontal infarct likely acute to subacute.  ? MR brain without contrast  with acute/subacute infarcts left frontal lobe cortex and white matter, no evidence of hemorrhagic conversion, mass affect or midline shift.   ?MRA head/neck with no intracranial large vessel occlusion, moderate to severe narrowing distal left P3 and narrowing in mid left M1 and multifocal narrowing in the mid to distal left A2 and A3 segments, no hemodynamically significant stenosis in the neck.   ?LDL 90, hemoglobin A1c 5.0.   ?TTE with LVEF 60 to 65%,  with grade 2 diastolic dysfunction. No thrombus identified.  ?--DAPT with Aspirin 325 mg p.o. daily and Plavix 75 mg p.o. daily x 3 months followed by aspirin alone ?--Crestor 20 mg p.o. daily ?-Therapy evaluations recommending CIR ?--Outpatient follow-up [facility] 4 weeks. ?- remains asymptomatic.  ?- pt looking forward to being discharged to inpatient rehab.  ?Slightly nauseated .  ? ? ?COVID-19 virus infection ?COVID-19 PCR positive on admission.   ?CXR is negative.  ?Completed the course of Paxlovid BID  ?Airborne isolation to be discontinued on 3/26.  ?Pt asymptomatic.  ? ?HTN (hypertension) ?BP parameters are well controlled.  ? ?Hypothyroidism ?Tsh WNL.  ?Continue with synthroid.  ? ?GERD (gastroesophageal reflux disease) ?Stable, on PPI.  ? ?HLD (hyperlipidemia) ?LDL is 90. ?Discharge on crestor.  ? ?Alcoholic  dementia (HCC) ?No behavioral abnormalities.  ?No signs of withdrawal.  ? ?Tobacco abuse ?Counseled on need for complete cessation. ?He is refusing nicotine patch.  ? ?Alcohol abuse, in remission ?Currently in remission.  Continue encourage cessation. ? ? ?Depression with anxiety ?--Lexapro 20 mg p.o. daily. ? ?Severe Malnutrition; ?Supplementations added.  ? ?  ? ?Malnutrition Type: ? ?Nutrition Problem: Severe Malnutrition ?Etiology: social / environmental circumstances ?Nutrition supplementation added.  ? ?Malnutrition Characteristics: ? ?Signs/Symptoms: severe muscle depletion, severe fat depletion ? ? ?Nutrition Interventions: ? ?Interventions: MVI,  Ensure Enlive (each supplement provides 350kcal and 20 grams of protein), Liberalize Diet ? ?Estimated body mass index is 17.91 kg/m? as calculated from the following: ?  Height as of 06/21/21: 5\' 1"  (1.549 m). ?  Weight as of this encounter: 43 kg. ? ?Code Status: full code.  ?DVT Prophylaxis:  enoxaparin (LOVENOX) injection 30 mg Start: 11/16/21 2200 ?SCD's Start: 11/15/21 1602 ? ? ?Level of Care: Level of care: Telemetry Medical ?Family Communication: none at bedside.  ? ?Disposition Plan:     Remains inpatient appropriate:  CIR placement.  ? ?Procedures:  ?None.  ? ?Consultants:   ?None.  ? ?Antimicrobials:  ? ?Anti-infectives (From admission, onward)  ? ? Start     Dose/Rate Route Frequency Ordered Stop  ? 11/16/21 1215  nirmatrelvir/ritonavir EUA (PAXLOVID) 3 tablet       ? 3 tablet Oral 2 times daily 11/16/21 1122 11/20/21 2236  ? ?  ? ? ? ?Medications ? ?Scheduled Meds: ? aspirin EC  325 mg Oral Daily  ? clopidogrel  75 mg Oral Daily  ? enoxaparin (LOVENOX) injection  30 mg Subcutaneous Q24H  ? escitalopram  20 mg Oral Daily  ? feeding supplement  237 mL Oral BID BM  ? levothyroxine  50 mcg Oral Daily  ? multivitamin with minerals  1 tablet Oral Daily  ? pantoprazole  40 mg Oral Daily  ? rosuvastatin  20 mg Oral Daily  ? ?Continuous Infusions: ?PRN Meds:.acetaminophen **OR** acetaminophen (TYLENOL) oral liquid 160 mg/5 mL **OR** acetaminophen, ondansetron (ZOFRAN) IV ? ? ? ?Subjective:  ? ?Ralph Jackson was seen and examined today.  Nauseated slightly.  ? ?Objective:  ? ?Vitals:  ? 11/24/21 0500 11/24/21 0807 11/24/21 1157 11/24/21 1629  ?BP:  125/72 125/77 133/83  ?Pulse:  67 75 90  ?Resp:  20 16 18   ?Temp:  97.8 ?F (36.6 ?C) 98.3 ?F (36.8 ?C) 98.4 ?F (36.9 ?C)  ?TempSrc:  Oral Oral Oral  ?SpO2:  100% 100% 97%  ?Weight: 43 kg     ? ? ?Intake/Output Summary (Last 24 hours) at 11/24/2021 1639 ?Last data filed at 11/24/2021 0500 ?Gross per 24 hour  ?Intake 60 ml  ?Output 300 ml  ?Net -240 ml  ? ? ?Filed  Weights  ? 11/24/21 0500  ?Weight: 43 kg  ? ? ? ?Exam ?General exam: Appears calm and comfortable  ?Respiratory system: Clear to auscultation. Respiratory effort normal. ?Cardiovascular system: S1 & S2 heard, RRR. No JVD, murmurs, rubs, gallops or clicks. No pedal edema. ?Gastrointestinal system: Abdomen is nondistended, soft and nontender. No organomegaly or masses felt. Normal bowel sounds heard. ?Central nervous system: Alert and oriented. No focal neurological deficits. ?Extremities: Symmetric 5 x 5 power. ?Skin: No rashes, lesions or ulcers ?Psychiatry: Mood & affect appropriate.  ? ? ? ? ?Data Reviewed:  I have personally reviewed following labs and imaging studies ? ? ?CBC ?Lab Results  ?Component Value Date  ? WBC 8.8 11/21/2021  ?  RBC 3.85 (L) 11/21/2021  ? HGB 11.6 (L) 11/21/2021  ? HCT 35.2 (L) 11/21/2021  ? MCV 91.4 11/21/2021  ? MCH 30.1 11/21/2021  ? PLT 276 11/21/2021  ? MCHC 33.0 11/21/2021  ? RDW 15.1 11/21/2021  ? LYMPHSABS 1.6 11/15/2021  ? MONOABS 0.9 11/15/2021  ? EOSABS 0.0 11/15/2021  ? BASOSABS 0.0 11/15/2021  ? ? ? ?Last metabolic panel ?Lab Results  ?Component Value Date  ? NA 138 11/21/2021  ? K 4.7 11/21/2021  ? CL 101 11/21/2021  ? CO2 29 11/21/2021  ? BUN 20 11/21/2021  ? CREATININE 0.91 11/21/2021  ? GLUCOSE 77 11/21/2021  ? GFRNONAA >60 11/21/2021  ? GFRAA >60 09/13/2019  ? CALCIUM 9.2 11/21/2021  ? PHOS 4.2 11/21/2021  ? PROT 7.2 11/15/2021  ? ALBUMIN 4.1 11/15/2021  ? BILITOT 0.4 11/15/2021  ? ALKPHOS 65 11/15/2021  ? AST 24 11/15/2021  ? ALT 16 11/15/2021  ? ANIONGAP 8 11/21/2021  ? ? ?CBG (last 3)  ?No results for input(s): GLUCAP in the last 72 hours.  ? ? ?Coagulation Profile: ?No results for input(s): INR, PROTIME in the last 168 hours. ? ? ?Radiology Studies: ?No results found. ? ? ? ? ?Hosie Poisson M.D. ?[name] ?11/24/2021, 4:39 PM ? ?Available via Epic secure chat 7am-7pm ?After 7 pm, please refer to night coverage provider listed on amion. ? ? ?` ?

## 2021-11-25 LAB — URINALYSIS, ROUTINE W REFLEX MICROSCOPIC
Bilirubin Urine: NEGATIVE
Glucose, UA: NEGATIVE mg/dL
Hgb urine dipstick: NEGATIVE
Ketones, ur: NEGATIVE mg/dL
Leukocytes,Ua: NEGATIVE
Nitrite: POSITIVE — AB
Protein, ur: NEGATIVE mg/dL
Specific Gravity, Urine: 1.026 (ref 1.005–1.030)
pH: 5 (ref 5.0–8.0)

## 2021-11-25 LAB — BASIC METABOLIC PANEL
Anion gap: 3 — ABNORMAL LOW (ref 5–15)
BUN: 29 mg/dL — ABNORMAL HIGH (ref 8–23)
CO2: 29 mmol/L (ref 22–32)
Calcium: 8.5 mg/dL — ABNORMAL LOW (ref 8.9–10.3)
Chloride: 104 mmol/L (ref 98–111)
Creatinine, Ser: 0.91 mg/dL (ref 0.61–1.24)
GFR, Estimated: >60 mL/min (ref >60)
Glucose, Bld: 98 mg/dL (ref 70–99)
Potassium: 4.4 mmol/L (ref 3.5–5.1)
Sodium: 136 mmol/L (ref 135–145)

## 2021-11-25 LAB — CBC
HCT: 33.5 % — ABNORMAL LOW (ref 39.0–52.0)
Hemoglobin: 11.2 g/dL — ABNORMAL LOW (ref 13.0–17.0)
MCH: 30.5 pg (ref 26.0–34.0)
MCHC: 33.4 g/dL (ref 30.0–36.0)
MCV: 91.3 fL (ref 80.0–100.0)
Platelets: 282 10*3/uL (ref 150–400)
RBC: 3.67 MIL/uL — ABNORMAL LOW (ref 4.22–5.81)
RDW: 14.1 % (ref 11.5–15.5)
WBC: 13.3 10*3/uL — ABNORMAL HIGH (ref 4.0–10.5)
nRBC: 0 % (ref 0.0–0.2)

## 2021-11-25 LAB — VITAMIN D 25 HYDROXY (VIT D DEFICIENCY, FRACTURES): Vit D, 25-Hydroxy: 47.08 ng/mL (ref 30–100)

## 2021-11-25 LAB — VITAMIN B12: Vitamin B-12: 719 pg/mL (ref 180–914)

## 2021-11-25 NOTE — Progress Notes (Signed)
? ? ? [name] ?                                                                            ? ? ?Ralph Jackson, is a 76 y.o. male, DOB - Aug 24, 1946, IEP:[id number] ?Admit date - 11/15/2021    ?Outpatient Primary MD for the patient is Ralph Jackson, Ralph R, PA-C ? ?LOS - 10  days ? ? ? ?Brief summary  ? ?Ralph Jackson is a 76 year old male with past medical history significant for anxiety/depression, hypothyroidism, essential hypertension, GERD, hyperlipidemia, prior history of EtOH abuse in remission who [facility] complaining of right-sided weakness, right-sided facial droop, poor appetite.  When family was assisting him during breakfast, seem he was drooling and could not keep the boost shake in his mouth.  Due to this concern, family brought him to the ED for further evaluation. ? ?In the ED, temperature 98.9 ?F, HR 57, RR 18, BP 150/77, SPO2 100% on room air.  WBC 9.5, hemoglobin 13.3, platelets 311.  Sodium 139, potassium 4.3, chloride 102, CO2 29, glucose 145, BUN 18, creatinine 0.96.  Lipase 36, AST 24, ALT 16, total bilirubin 0.4, ammonia level 19.  Urinalysis unrevealing.  COVID-19 PCR positive.  Influenza A/B PCR negative.  CT head without contrast with small cortical/subcortical left frontal infarcts likely acute to subacute, chronic infarct and chronic microvascular ischemic changes noted.  Neurology was consulted.  [facility] consulted for further evaluation and management of acute versus subacute stroke.  [facility] ? ?Pt stable for discharge to CIR from medical standpoint.  ? ?Assessment & Plan  ? ? ?Assessment and Plan: ?* CVA (cerebral vascular accident) (HCC) ?Patient presenting to the ED with right facial droop, right-sided upper/lower extremity weakness. ?  CT head without contrast with findings of small cortical/subcortical left frontal infarct likely acute to subacute.  ? MR brain without  contrast with acute/subacute infarcts left frontal lobe cortex and white matter, no evidence of hemorrhagic conversion, mass affect or midline shift.   ?MRA head/neck with no intracranial large vessel occlusion, moderate to severe narrowing distal left P3 and narrowing in mid left M1 and multifocal narrowing in the mid to distal left A2 and A3 segments, no hemodynamically significant stenosis in the neck.   ?LDL 90, hemoglobin A1c 5.0.   ?TTE with LVEF 60 to 65%,  with grade 2 diastolic dysfunction. No thrombus identified.  ?--DAPT with Aspirin 325 mg p.o. daily and Plavix 75 mg p.o. daily x 3 months followed by aspirin alone ?--Crestor 20 mg p.o. daily ?-Therapy evaluations recommending CIR ?--Outpatient follow-up [facility] 4 weeks. ?- remains asymptomatic.  ?- pt looking forward to being discharged to inpatient rehab.  ?Slightly nauseated .  ? ? ?COVID-19 virus infection ?COVID-19 PCR positive on admission.   ?CXR is negative.  ?Completed the course of Paxlovid BID  ?Airborne isolation to be discontinued on 3/26.  ?Pt asymptomatic.  ? ?HTN (hypertension) ?BP parameters are well controlled.  ? ?Hypothyroidism ?Tsh WNL.  ?Continue with synthroid.  ? ?GERD (gastroesophageal reflux disease) ?Stable, on PPI.  ? ?HLD (hyperlipidemia) ?LDL is 90. ?Discharge on crestor.  ? ?Alcoholic  dementia (HCC) ?No behavioral abnormalities.  ?No signs of withdrawal.  ? ?Tobacco abuse ?Counseled on need for complete cessation. ?He is refusing nicotine patch.  ? ?Alcohol abuse, in remission ?Currently in remission.  Continue encourage cessation. ? ? ?Depression with anxiety ?--Lexapro 20 mg p.o. daily. ?Psychiatry consulted as pt is not eating or drinking .  ?He denies any suicidal ideations. Repeat labs are wnl.  ? ?Severe Malnutrition; ?Supplementations added.  ? ?  ? ?Malnutrition Type: ? ?Nutrition Problem: Severe Malnutrition ?Etiology: social / environmental circumstances ?Nutrition supplementation added.  ? ?Malnutrition  Characteristics: ? ?Signs/Symptoms: severe muscle depletion, severe fat depletion ? ? ?Nutrition Interventions: ? ?Interventions: MVI, Ensure Enlive (each supplement provides 350kcal and 20 grams of protein), Liberalize Diet ? ?Estimated body mass index is 17.91 kg/m? as calculated from the following: ?  Height as of 06/21/21: 5\' 1"  (1.549 m). ?  Weight as of this encounter: 43 kg. ? ?Code Status: full code.  ?DVT Prophylaxis:  enoxaparin (LOVENOX) injection 30 mg Start: 11/16/21 2200 ?SCD's Start: 11/15/21 1602 ? ? ?Level of Care: Level of care: Telemetry Medical ?Family Communication: none at bedside.  ? ?Disposition Plan:     Remains inpatient appropriate:  CIR placement.  ? ?Procedures:  ?None.  ? ?Consultants:   ?None.  ? ?Antimicrobials:  ? ?Anti-infectives (From admission, onward)  ? ? Start     Dose/Rate Route Frequency Ordered Stop  ? 11/16/21 1215  nirmatrelvir/ritonavir EUA (PAXLOVID) 3 tablet       ? 3 tablet Oral 2 times daily 11/16/21 1122 11/20/21 2236  ? ?  ? ? ? ?Medications ? ?Scheduled Meds: ? aspirin EC  325 mg Oral Daily  ? clopidogrel  75 mg Oral Daily  ? enoxaparin (LOVENOX) injection  30 mg Subcutaneous Q24H  ? escitalopram  20 mg Oral Daily  ? feeding supplement  237 mL Oral BID BM  ? levothyroxine  50 mcg Oral Daily  ? multivitamin with minerals  1 tablet Oral Daily  ? pantoprazole  40 mg Oral Daily  ? rosuvastatin  20 mg Oral Daily  ? ?Continuous Infusions: ?PRN Meds:.acetaminophen **OR** acetaminophen (TYLENOL) oral liquid 160 mg/5 mL **OR** acetaminophen, ondansetron (ZOFRAN) IV ? ? ? ?Subjective:  ? ?Ralph Jackson was seen and examined today.  No new complaints.  ? ?Objective:  ? ?Vitals:  ? 11/25/21 0825 11/25/21 1145 11/25/21 1544 11/25/21 2046  ?BP: 104/87 121/71 116/63 107/70  ?Pulse: 86 81 80 81  ?Resp: 20 18 16 18   ?Temp: 97.9 ?F (36.6 ?C) 98.4 ?F (36.9 ?C) 97.8 ?F (36.6 ?C) 99.1 ?F (37.3 ?C)  ?TempSrc: Oral Oral Oral Oral  ?SpO2: 98% 97% 98% 96%  ?Weight:       ? ? ?Intake/Output Summary (Last 24 hours) at 11/25/2021 2304 ?Last data filed at 11/25/2021 1713 ?Gross per 24 hour  ?Intake 120 ml  ?Output 525 ml  ?Net -405 ml  ? ?Filed Weights  ? 11/24/21 0500  ?Weight: 43 kg  ? ? ? ?Exam ?General exam: Appears calm and comfortable  ?Respiratory system: Clear to auscultation. Respiratory effort normal. ?Cardiovascular system: S1 & S2 heard, RRR. No JVD,  No pedal edema. ?Gastrointestinal system: Abdomen is nondistended, soft and nontender. Normal bowel sounds heard. ?Central nervous system: Alert and oriented. No focal neurological deficits. ?Extremities: Symmetric 5 x 5 power. ?Skin: No rashes, lesions or ulcers ?Psychiatry: Mood & affect appropriate.  ? ? ? ? ? ?Data Reviewed:  I have personally reviewed following labs and imaging studies ? ? ?  CBC ?Lab Results  ?Component Value Date  ? WBC 13.3 (H) 11/25/2021  ? RBC 3.67 (L) 11/25/2021  ? HGB 11.2 (L) 11/25/2021  ? HCT 33.5 (L) 11/25/2021  ? MCV 91.3 11/25/2021  ? MCH 30.5 11/25/2021  ? PLT 282 11/25/2021  ? MCHC 33.4 11/25/2021  ? RDW 14.1 11/25/2021  ? LYMPHSABS 1.6 11/15/2021  ? MONOABS 0.9 11/15/2021  ? EOSABS 0.0 11/15/2021  ? BASOSABS 0.0 11/15/2021  ? ? ? ?Last metabolic panel ?Lab Results  ?Component Value Date  ? NA 136 11/25/2021  ? K 4.4 11/25/2021  ? CL 104 11/25/2021  ? CO2 29 11/25/2021  ? BUN 29 (H) 11/25/2021  ? CREATININE 0.91 11/25/2021  ? GLUCOSE 98 11/25/2021  ? GFRNONAA >60 11/25/2021  ? GFRAA >60 09/13/2019  ? CALCIUM 8.5 (L) 11/25/2021  ? PHOS 4.2 11/21/2021  ? PROT 7.2 11/15/2021  ? ALBUMIN 4.1 11/15/2021  ? BILITOT 0.4 11/15/2021  ? ALKPHOS 65 11/15/2021  ? AST 24 11/15/2021  ? ALT 16 11/15/2021  ? ANIONGAP 3 (L) 11/25/2021  ? ? ?CBG (last 3)  ?No results for input(s): GLUCAP in the last 72 hours.  ? ? ?Coagulation Profile: ?No results for input(s): INR, PROTIME in the last 168 hours. ? ? ?Radiology Studies: ?No results found. ? ? ? ? ?Kathlen Mody M.D. ?[name] ?11/25/2021, 11:04  PM ? ?Available via Epic secure chat 7am-7pm ?After 7 pm, please refer to night coverage provider listed on amion. ? ? ?` ?

## 2021-11-26 ENCOUNTER — Encounter (HOSPITAL_COMMUNITY): Payer: Self-pay | Admitting: Physical Medicine & Rehabilitation

## 2021-11-26 ENCOUNTER — Inpatient Hospital Stay (HOSPITAL_COMMUNITY)
Admission: RE | Admit: 2021-11-26 | Discharge: 2021-12-13 | DRG: 057 | Disposition: A | Discharge status: Home / Self Care | Payer: Medicare Other | Source: Intra-hospital | Attending: Physical Medicine & Rehabilitation | Admitting: Physical Medicine & Rehabilitation

## 2021-11-26 ENCOUNTER — Other Ambulatory Visit: Payer: Self-pay

## 2021-11-26 DIAGNOSIS — H9193 Unspecified hearing loss, bilateral: Secondary | ICD-10-CM | POA: Diagnosis present

## 2021-11-26 DIAGNOSIS — E039 Hypothyroidism, unspecified: Secondary | ICD-10-CM | POA: Diagnosis present

## 2021-11-26 DIAGNOSIS — Z79899 Other long term (current) drug therapy: Secondary | ICD-10-CM

## 2021-11-26 DIAGNOSIS — Z79890 Hormone replacement therapy (postmenopausal): Secondary | ICD-10-CM | POA: Diagnosis not present

## 2021-11-26 DIAGNOSIS — I959 Hypotension, unspecified: Secondary | ICD-10-CM | POA: Diagnosis not present

## 2021-11-26 DIAGNOSIS — Z713 Dietary counseling and surveillance: Secondary | ICD-10-CM | POA: Diagnosis not present

## 2021-11-26 DIAGNOSIS — E785 Hyperlipidemia, unspecified: Secondary | ICD-10-CM | POA: Diagnosis present

## 2021-11-26 DIAGNOSIS — R636 Underweight: Secondary | ICD-10-CM | POA: Diagnosis present

## 2021-11-26 DIAGNOSIS — I69351 Hemiplegia and hemiparesis following cerebral infarction affecting right dominant side: Secondary | ICD-10-CM | POA: Diagnosis present

## 2021-11-26 DIAGNOSIS — Z7902 Long term (current) use of antithrombotics/antiplatelets: Secondary | ICD-10-CM | POA: Diagnosis not present

## 2021-11-26 DIAGNOSIS — I10 Essential (primary) hypertension: Secondary | ICD-10-CM | POA: Diagnosis present

## 2021-11-26 DIAGNOSIS — Z8616 Personal history of COVID-19: Secondary | ICD-10-CM

## 2021-11-26 DIAGNOSIS — F0393 Unspecified dementia, unspecified severity, with mood disturbance: Secondary | ICD-10-CM | POA: Diagnosis present

## 2021-11-26 DIAGNOSIS — Z681 Body mass index (BMI) 19 or less, adult: Secondary | ICD-10-CM

## 2021-11-26 DIAGNOSIS — F419 Anxiety disorder, unspecified: Secondary | ICD-10-CM | POA: Diagnosis present

## 2021-11-26 DIAGNOSIS — K5900 Constipation, unspecified: Secondary | ICD-10-CM | POA: Diagnosis present

## 2021-11-26 DIAGNOSIS — Z720 Tobacco use: Secondary | ICD-10-CM | POA: Diagnosis not present

## 2021-11-26 DIAGNOSIS — A499 Bacterial infection, unspecified: Secondary | ICD-10-CM | POA: Diagnosis not present

## 2021-11-26 DIAGNOSIS — R3915 Urgency of urination: Secondary | ICD-10-CM | POA: Diagnosis not present

## 2021-11-26 DIAGNOSIS — Z96641 Presence of right artificial hip joint: Secondary | ICD-10-CM | POA: Diagnosis present

## 2021-11-26 DIAGNOSIS — Z7982 Long term (current) use of aspirin: Secondary | ICD-10-CM | POA: Diagnosis not present

## 2021-11-26 DIAGNOSIS — I639 Cerebral infarction, unspecified: Secondary | ICD-10-CM | POA: Diagnosis present

## 2021-11-26 DIAGNOSIS — N390 Urinary tract infection, site not specified: Secondary | ICD-10-CM | POA: Diagnosis not present

## 2021-11-26 MED ORDER — PANTOPRAZOLE SODIUM 40 MG PO TBEC
40.0000 mg | DELAYED_RELEASE_TABLET | Freq: Every day | ORAL | Status: DC
  Administered 2021-11-27 – 2021-12-13 (×17): 40 mg via ORAL
  Filled 2021-11-26 (×17): qty 1

## 2021-11-26 MED ORDER — CEPHALEXIN 500 MG PO CAPS
500.0000 mg | ORAL_CAPSULE | Freq: Two times a day (BID) | ORAL | Status: DC
Start: 2021-11-26 — End: 2021-11-26
  Administered 2021-11-26: 500 mg via ORAL
  Filled 2021-11-26: qty 1

## 2021-11-26 MED ORDER — ROSUVASTATIN CALCIUM 20 MG PO TABS: 20.0000 mg | ORAL_TABLET | Freq: Every day | ORAL | 1 refills | Status: DC

## 2021-11-26 MED ORDER — ADULT MULTIVITAMIN W/MINERALS CH
1.0000 | ORAL_TABLET | Freq: Every day | ORAL | Status: DC
  Administered 2021-11-27 – 2021-12-13 (×17): 1 via ORAL
  Filled 2021-11-26 (×17): qty 1

## 2021-11-26 MED ORDER — ENOXAPARIN SODIUM 30 MG/0.3ML IJ SOSY: 30.0000 mg | PREFILLED_SYRINGE | INTRAMUSCULAR | Status: DC

## 2021-11-26 MED ORDER — METOPROLOL TARTRATE 12.5 MG HALF TABLET
12.5000 mg | ORAL_TABLET | Freq: Two times a day (BID) | ORAL | Status: DC
  Administered 2021-11-26 – 2021-12-08 (×24): 12.5 mg via ORAL
  Filled 2021-11-26 (×24): qty 1

## 2021-11-26 MED ORDER — ASPIRIN EC 325 MG PO TBEC
325.0000 mg | DELAYED_RELEASE_TABLET | Freq: Every day | ORAL | Status: DC
  Administered 2021-11-27 – 2021-12-13 (×17): 325 mg via ORAL
  Filled 2021-11-26 (×17): qty 1

## 2021-11-26 MED ORDER — ACETAMINOPHEN 650 MG RE SUPP: 650.0000 mg | RECTAL | Status: DC | PRN

## 2021-11-26 MED ORDER — ROSUVASTATIN CALCIUM 20 MG PO TABS
20.0000 mg | ORAL_TABLET | Freq: Every day | ORAL | Status: DC
  Administered 2021-11-27 – 2021-12-13 (×17): 20 mg via ORAL
  Filled 2021-11-26 (×17): qty 1

## 2021-11-26 MED ORDER — CLOPIDOGREL BISULFATE 75 MG PO TABS: 75.0000 mg | ORAL_TABLET | Freq: Every day | ORAL | 2 refills | Status: DC

## 2021-11-26 MED ORDER — CEPHALEXIN 250 MG PO CAPS
500.0000 mg | ORAL_CAPSULE | Freq: Two times a day (BID) | ORAL | Status: AC
  Administered 2021-11-26 – 2021-12-01 (×10): 500 mg via ORAL
  Filled 2021-11-26 (×10): qty 2

## 2021-11-26 MED ORDER — CEPHALEXIN 500 MG PO CAPS: 500.0000 mg | ORAL_CAPSULE | Freq: Two times a day (BID) | ORAL | 0 refills | Status: DC

## 2021-11-26 MED ORDER — ASPIRIN 325 MG PO TBEC: 325.0000 mg | DELAYED_RELEASE_TABLET | Freq: Every day | ORAL | 3 refills | Status: DC

## 2021-11-26 MED ORDER — ESCITALOPRAM OXALATE 10 MG PO TABS
20.0000 mg | ORAL_TABLET | Freq: Every day | ORAL | Status: DC
  Administered 2021-11-27 – 2021-12-13 (×17): 20 mg via ORAL
  Filled 2021-11-26 (×17): qty 2

## 2021-11-26 MED ORDER — ENOXAPARIN SODIUM 30 MG/0.3ML IJ SOSY
30.0000 mg | PREFILLED_SYRINGE | INTRAMUSCULAR | Status: DC
  Administered 2021-11-26 – 2021-12-12 (×17): 30 mg via SUBCUTANEOUS
  Filled 2021-11-26 (×17): qty 0.3

## 2021-11-26 MED ORDER — ACETAMINOPHEN 160 MG/5ML PO SOLN: 650.0000 mg | ORAL | Status: DC | PRN

## 2021-11-26 MED ORDER — CLOPIDOGREL BISULFATE 75 MG PO TABS
75.0000 mg | ORAL_TABLET | Freq: Every day | ORAL | Status: DC
  Administered 2021-11-27 – 2021-12-13 (×17): 75 mg via ORAL
  Filled 2021-11-26 (×17): qty 1

## 2021-11-26 MED ORDER — ACETAMINOPHEN 325 MG PO TABS: 650.0000 mg | ORAL_TABLET | ORAL | Status: DC | PRN

## 2021-11-26 MED ORDER — LEVOTHYROXINE SODIUM 50 MCG PO TABS
50.0000 ug | ORAL_TABLET | Freq: Every day | ORAL | Status: DC
  Administered 2021-11-27 – 2021-12-13 (×17): 50 ug via ORAL
  Filled 2021-11-26 (×17): qty 1

## 2021-11-26 MED ORDER — ENSURE ENLIVE PO LIQD
237.0000 mL | Freq: Two times a day (BID) | ORAL | Status: DC
  Administered 2021-11-27 – 2021-12-13 (×25): 237 mL via ORAL

## 2021-11-26 NOTE — Progress Notes (Addendum)
Inpatient Rehabilitation Discharge Medication Review by a Pharmacist ? ?A complete drug regimen review was completed for this patient to identify any potential clinically significant medication issues. ? ?High Risk Drug Classes Is patient taking? Indication by Medication  ?Antipsychotic No   ?Anticoagulant Yes Lovenox for VTE ppx  ?Antibiotic Yes Cephalexin (UTI)  ?Opioid No   ?Antiplatelet Yes Aspirin 325mg , clopidogrel 75mg  (stroke prevention)  ?Hypoglycemics/insulin No   ?Vasoactive Medication Yes Metoprolol (HTN)  ?Chemotherapy No   ?Other Yes Denosumab (osteoporosis) ?Escitalopram (mood disorder) ?Levothyroxine 18mcg (hypothyroidism) ?Omeprazole (GERD) ?Rosuvastatin 20mg  (HLD)  ? ? ? ?Type of Medication Issue Identified Description of Issue Recommendation(s)  ?Drug Interaction(s) (clinically significant) ?    ?Duplicate Therapy ?    ?Allergy ?    ?No Medication Administration End Date ?    ?Incorrect Dose ?    ?Additional Drug Therapy Needed ?    ?Significant med changes from prior encounter (inform family/care partners about these prior to discharge).    ?Other ?    ? ? ?Clinically significant medication issues were identified that warrant physician communication and completion of prescribed/recommended actions by midnight of the next day:  No ? ? ?Pharmacist comments:  ?- per neurology, continue DAPT (aspirin 325mg  PO QD and clopidogrel 75mg  PO QD) for 3 months followed by aspirin alone ? ?Time spent performing this drug regimen review (minutes):  30 ? ? ?Thank you for allowing pharmacy to be a part of this patient?s care. ? ?Ardyth Harps, PharmD ?Clinical Pharmacist ? ?

## 2021-11-26 NOTE — Progress Notes (Signed)
?   11/26/21 1924  ?Intake (mL)  ?P.O. 177 mL  ?Percent Meals Eaten (%) 20 %  ? ?Pt decided to eat supper ?

## 2021-11-26 NOTE — H&P (Incomplete)
? ? ?Physical Medicine and Rehabilitation Admission H&P ? ?  ?Chief Complaint  ?Patient presents with  ? Weakness  ?: ?HPI: Ralph Jackson is a 76 year old right-handed male with history of anxiety/depression, early dementia, hypothyroidism, hypertension, hyperlipidemia, prior history of alcohol/tobacco abuse in remission and a right total hip arthroplasty 06/21/2021.  Per chart review patient was with daughter and son-in-law .  Two-level home 4 steps to entry.  Daughter does assist with some ADLs.  Patient does not drive.  Presented 11/15/2021 with acute onset of right side weakness.  Admission chemistries hemoglobin 13.3, platelets 311,000, BUN 18, creatinine 0.96, COVID-19 PCR positive, influenza A/B PCR negative.  Cranial CT scan showed small cortical/subcortical left frontal infarct likely acute to subacute.  CT angiogram head and neck showed atherosclerotic disease of both carotid siphon regions but no stenosis greater than 30%.  MRI acute/subacute infarct left frontal lobe cortex and white matter.  No evidence of hemorrhagic conversion.  No mass effect or midline shift.  MRA of head and neck with no intracranial large vessel occlusion.  Neurology follow-up maintained on aspirin 325 mg daily and Plavix 75 mg daily for 90 days then aspirin 81 mg daily alone.  Lovenox added for DVT prophylaxis.  Patient did complete course of Paxlovid twice daily for COVID-19 infection Airborne isolation discontinued 3/26.  Chest x-ray was negative.  Tolerating a regular consistency diet but has had very poor intake .  Urinalysis 11/25/2021 positive nitrite with urine culture pending placed on Keflex empirically.  Psychiatry service consulted in regards to patient's history of depression and anxiety therapy evaluations completed due to patient's right side weakness was admitted for a comprehensive rehab program. ? ?Review of Systems  ?Constitutional:  Negative for chills and fever.  ?HENT:  Negative for hearing loss.    ?Eyes:  Negative for blurred vision and double vision.  ?Respiratory:  Negative for cough and shortness of breath.   ?Cardiovascular:  Negative for chest pain and palpitations.  ?Gastrointestinal:  Positive for constipation. Negative for heartburn, nausea and vomiting.  ?Genitourinary:  Negative for dysuria, flank pain and hematuria.  ?Musculoskeletal:  Positive for joint pain and myalgias.  ?Skin:  Negative for rash.  ?Neurological:  Positive for weakness.  ?Psychiatric/Behavioral:  Positive for depression.   ?     Anxiety  ?All other systems reviewed and are negative. ?Past Medical History:  ?Diagnosis Date  ? Depression   ? ETOH abuse   ? In remission for several years now  ? Hypertension   ? Urinary tract infection   ? ?Past Surgical History:  ?Procedure Laterality Date  ? TOTAL HIP ARTHROPLASTY Right 06/21/2021  ? Procedure: TOTAL HIP ARTHROPLASTY ANTERIOR APPROACH;  Surgeon: Samson Frederic, MD;  Location: [facility];  Service: Orthopedics;  Laterality: Right;  ? ?Family History  ?Problem Relation Age of Onset  ? Osteoporosis Neg Hx   ? ?Social History:  reports that he has never smoked. He uses smokeless tobacco. He reports that he does not currently use alcohol after a past usage of about 6.0 standard drinks per week. He reports that he does not use drugs. ?Allergies: No Known Allergies ?Medications Prior to Admission  ?Medication Sig Dispense Refill  ? acetaminophen (TYLENOL) 500 MG tablet Take 1,000 mg by mouth every 6 (six) hours as needed for mild pain.    ? escitalopram (LEXAPRO) 20 MG tablet Take 20 mg by mouth daily.    ? feeding supplement (ENSURE ENLIVE / ENSURE PLUS) LIQD Take 237 mLs by mouth  2 (two) times daily between meals. 237 mL 12  ? levothyroxine (SYNTHROID) 50 MCG tablet Take 50 mcg by mouth daily.    ? loratadine (CLARITIN) 10 MG tablet Take 1 tablet (10 mg total) by mouth daily. (Patient taking differently: Take 10 mg by mouth at bedtime.) 15 tablet 0  ? metoprolol tartrate (LOPRESSOR) 25  MG tablet Take 12.5 mg by mouth 2 (two) times daily.    ? Multiple Vitamins-Minerals (ONE-A-DAY MENS 50+) TABS Take 1 tablet by mouth daily.    ? omeprazole (PRILOSEC) 20 MG capsule Take 20 mg by mouth daily.    ? pravastatin (PRAVACHOL) 10 MG tablet Take 10 mg by mouth at bedtime.    ? PROLIA 60 MG/ML SOSY injection Inject 60 mg into the skin See admin instructions. Every 6 months    ? aspirin (ASPIRIN CHILDRENS) 81 MG chewable tablet Chew 1 tablet (81 mg total) by mouth 2 (two) times daily. (Patient not taking: Reported on 11/15/2021) 36 tablet 11  ? HYDROcodone-acetaminophen (NORCO/VICODIN) 5-325 MG tablet Take 1-2 tablets by mouth every 4 (four) hours as needed for moderate pain. (Patient not taking: Reported on 11/15/2021) 30 tablet 0  ? Nutritional Supplements (,FEEDING SUPPLEMENT, PROSOURCE PLUS) liquid Take 30 mLs by mouth 2 (two) times daily between meals. (Patient not taking: Reported on 11/15/2021) 887 mL 10  ? polyethylene glycol (MIRALAX / GLYCOLAX) 17 g packet Take 17 g by mouth daily as needed for mild constipation. (Patient not taking: Reported on 11/15/2021) 14 each 0  ? senna (SENOKOT) 8.6 MG TABS tablet Take 1 tablet (8.6 mg total) by mouth 2 (two) times daily. (Patient not taking: Reported on 11/15/2021) 120 tablet 0  ? ? ? ? ?Home: ?Home Living ?Family/patient expects to be discharged to:: Private residence ?Living Arrangements: Children (dtr and son-in-law) ?Available Help at Discharge: Family, Available 24 hours/day ?Type of Home: House ?Home Access: Stairs to enter ?Entrance Stairs-Number of Steps: 4 ?Entrance Stairs-Rails: Left, Right ?Home Layout: Two level ?Alternate Level Stairs-Number of Steps: flight ?Alternate Level Stairs-Rails: Right ?Bathroom Shower/Tub: Tub/shower unit ?Bathroom Toilet: Standard ?Bathroom Accessibility: Yes ?Home Equipment: Agricultural consultant (2 wheels), Wheelchair - manual, Tub bench ? Lives With: Daughter ?  ?Functional History: ?Prior Function ?Prior Level of Function :  Independent/Modified Independent ?Mobility Comments: pt reports not use any AD, doesn't drive, per daughter stairs was a struggle ?ADLs Comments: reports independence ? ?Functional Status:  ?Mobility: ?Bed Mobility ?Overal bed mobility: Needs Assistance ?Bed Mobility: Sit to Supine ?Supine to sit: Supervision ?Sit to supine: Supervision ?General bed mobility comments: increased time to perform ?Transfers ?Overall transfer level: Needs assistance ?Equipment used: Rolling walker (2 wheels) ?Transfers: Sit to/from Stand ?Sit to Stand: Min assist ?Bed to/from chair/wheelchair/BSC transfer type:: Step pivot ?Step pivot transfers: Min assist, Mod assist ?General transfer comment: min/mod assist for step pivot to eob ?Ambulation/Gait ?Ambulation/Gait assistance: Min assist ?Gait Distance (Feet): 30 Feet (x 2) ?Assistive device: Rolling walker (2 wheels) (youth RW) ?Gait Pattern/deviations: Step-to pattern, Knee flexed in stance - right, Antalgic, Decreased step length - right, Trendelenburg, Trunk flexed ?General Gait Details: Assist for balance and support and maximodal cues to extend through rt knee and hip. Significant trendelenburg - likely due to stroke and hip weakness after THR. ?Gait velocity: decr ?Gait velocity interpretation: <1.8 ft/sec, indicate of risk for recurrent falls ?  ? ?ADL: ?ADL ?Overall ADL's : Needs assistance/impaired ?Eating/Feeding: Set up, Sitting ?Grooming: Armed forces technical officer, Therapist, nutritional, Min guard, Standing ?Grooming Details (indicate cue type and reason):  min guard assist for balance ?Upper Body Bathing: Minimal assistance, Standing ?Upper Body Bathing Details (indicate cue type and reason): performed standing at sink ?Lower Body Bathing: Moderate assistance ?Lower Body Bathing Details (indicate cue type and reason): stood for peri area cleaning with assistance for bottom ?Upper Body Dressing : Minimal assistance, Sitting ?Upper Body Dressing Details (indicate cue type and reason): changed  gown ?Lower Body Dressing: Maximal assistance, Sitting/lateral leans, Sit to/from stand ?Toilet Transfer: Minimal assistance, Grab bars, Rolling walker (2 wheels) ?Toilet Transfer Details (indicate cue type an

## 2021-11-26 NOTE — Progress Notes (Signed)
?   11/26/21 1805  ?Intake (mL)  ?P.O. 0 mL  ?Percent Meals Eaten (%) 0 %  ? ?Pt refused to eat at this time. Inquired about patient favorite foods or drinks and patient responded "I dont have any". Offered patient an ensure and pt refused. Educated pt on importance of nutrition. Pt verbalizes understanding. Tray left at bedside. ?

## 2021-11-26 NOTE — Progress Notes (Signed)
Physical Therapy Treatment ?Patient Details ?Name: Ralph Jackson ?MRN: [id number] ?DOB: 01-07-1946 ?Today's Date: 11/26/2021 ? ? ?History of Present Illness Pt is 76yo male who presented to ED with R UE/LE weakness. 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 PMH: tobacco abuse, HTN, hypthyroidism, HLD< GERD, dementia, and ETOH abuse PSH: R THA 10/22. ? ?  ?PT Comments  ? ? Pt remains to have flat affect but agreeable to therapy. Pt now off COVID precautions and was able to ambulate in room. Pt remains to have decreased safety awareness especially when turning to sit. Acute PT to cont to follow. Continue to recommend AIR upon d/c. ?   ?Recommendations for follow up therapy are one component of a multi-disciplinary discharge planning process, led by the attending physician.  Recommendations may be updated based on patient status, additional functional criteria and insurance authorization. ? ?Follow Up Recommendations ? Acute inpatient rehab (3hours/day) ?  ?  ?Assistance Recommended at Discharge Frequent or constant Supervision/Assistance  ?Patient can return home with the following A lot of help with walking and/or transfers;A lot of help with bathing/dressing/bathroom;Assistance with cooking/housework;Direct supervision/assist for medications management;Direct supervision/assist for financial management;Assist for transportation;Help with stairs or ramp for entrance ?  ?Equipment Recommendations ? Rolling walker (2 wheels) (youth)  ?  ?Recommendations for Other Services Rehab consult ? ? ?  ?Precautions / Restrictions Precautions ?Precautions: Fall ?Restrictions ?Weight Bearing Restrictions: No  ?  ? ?Mobility ? Bed Mobility ?  ?  ?  ?  ?  ?  ?  ?General bed mobility comments: pt up in chair upon PT arrival ?  ? ?Transfers ?Overall transfer level: Needs assistance ?Equipment used: Rolling walker (2 wheels) ?Transfers: Sit to/from Stand ?Sit to Stand: Min assist ?  ?Step pivot transfers: Mod assist ?  ?  ?   ?General transfer comment: minA to power up and keep weight shift to the R. max directional verbal cues for safety, especially when turning to turn all the way around instead of lean into chair/EOB, completed 3 step pvt transfers, no carryover of technique ?  ? ?Ambulation/Gait ?Ambulation/Gait assistance: Min assist ?Gait Distance (Feet): 40 Feet ?Assistive device: Rolling walker (2 wheels) (youth RW) ?Gait Pattern/deviations: Step-to pattern, Knee flexed in stance - right, Antalgic, Decreased step length - right, Trendelenburg, Trunk flexed ?Gait velocity: decr ?Gait velocity interpretation: <1.8 ft/sec, indicate of risk for recurrent falls ?  ?General Gait Details: Assist for balance and support and maximodal cues to extend through rt knee and hip. Significant trendelenburg - likely due to stroke and hip weakness after THR. ? ? ?Stairs ?  ?  ?  ?  ?  ? ? ?Wheelchair Mobility ?  ? ?Modified Rankin (Stroke Patients Only) ?Modified Rankin (Stroke Patients Only) ?Pre-Morbid Rankin Score: Moderate disability ?Modified Rankin: Moderately severe disability ? ? ?  ?Balance Overall balance assessment: Needs assistance ?Sitting-balance support: Feet supported, No upper extremity supported ?Sitting balance-Leahy Scale: Fair ?  ?  ?Standing balance support: Single extremity supported, Bilateral upper extremity supported, During functional activity ?Standing balance-Leahy Scale: Poor ?Standing balance comment: able to stand at sink for peri care ?  ?  ?  ?  ?  ?  ?  ?  ?  ?  ?  ?  ? ?  ?Cognition Arousal/Alertness: Awake/alert ?Behavior During Therapy: Flat affect ?Overall Cognitive Status: Within Functional Limits for tasks assessed ?  ?  ?  ?  ?  ?  ?  ?  ?  ?  ?  ?  ?  ?  ?  ?  ?  General Comments: cues for safety, pt flat with depression, fear of falling ?  ?  ? ?  ?Exercises   ? ?  ?General Comments General comments (skin integrity, edema, etc.): vss ?  ?  ? ?Pertinent Vitals/Pain Pain Assessment ?Pain Assessment:  No/denies pain  ? ? ?Home Living   ?  ?  ?  ?  ?  ?  ?  ?  ?  ?   ?  ?Prior Function    ?  ?  ?   ? ?PT Goals (current goals can now be found in the care plan section) Acute Rehab PT Goals ?Patient Stated Goal: did not state ?PT Goal Formulation: With patient ?Time For Goal Achievement: 11/30/21 ?Potential to Achieve Goals: Good ?Progress towards PT goals: Progressing toward goals ? ?  ?Frequency ? ? ? Min 4X/week ? ? ? ?  ?PT Plan Current plan remains appropriate  ? ? ?Co-evaluation   ?  ?  ?  ?  ? ?  ?AM-PAC PT "6 Clicks" Mobility   ?Outcome Measure ? Help needed turning from your back to your side while in a flat bed without using bedrails?: A Little ?Help needed moving from lying on your back to sitting on the side of a flat bed without using bedrails?: A Little ?Help needed moving to and from a bed to a chair (including a wheelchair)?: A Lot ?Help needed standing up from a chair using your arms (e.g., wheelchair or bedside chair)?: A Little ?Help needed to walk in hospital room?: A Lot ?Help needed climbing 3-5 steps with a railing? : Total ?6 Click Score: 14 ? ?  ?End of Session Equipment Utilized During Treatment: Gait belt ?Activity Tolerance: Patient tolerated treatment well ?Patient left:  (in w/c with RN to transport to AIR) ?Nurse Communication: Mobility status ?PT Visit Diagnosis: Unsteadiness on feet (R26.81);Muscle weakness (generalized) (M62.81);Difficulty in walking, not elsewhere classified (R26.2) ?  ? ? ?Time: [id number] ?PT Time Calculation (min) (ACUTE ONLY): 15 min ? ?Charges:  $Gait Training: 8-22 mins          ?          ? ?Kittie Plater, PT, DPT ?Acute Rehabilitation Services ?Pager #: [phone] ?Office #: [phone] ? ? ? ?Alamin Mccuiston M Jestine Bicknell ?11/26/2021, 2:36 PM ? ?

## 2021-11-26 NOTE — Evaluation (Addendum)
Clinical/Bedside Swallow Evaluation ?Patient Details  ?Name: Ralph Jackson ?MRN: [id number] ?Date of Birth: [birth year] ? ?Today's Date: 11/26/2021 ?Time: SLP Start Time (ACUTE ONLY): 1024 SLP Stop Time (ACUTE ONLY): [id number] ?SLP Time Calculation (min) (ACUTE ONLY): 17 min ? ?Past Medical History:  ?Past Medical History:  ?Diagnosis Date  ? Depression   ? ETOH abuse   ? In remission for several years now  ? Hypertension   ? Urinary tract infection   ? ?Past Surgical History:  ?Past Surgical History:  ?Procedure Laterality Date  ? TOTAL HIP ARTHROPLASTY Right 06/21/2021  ? Procedure: TOTAL HIP ARTHROPLASTY ANTERIOR APPROACH;  Surgeon: Rod Can, MD;  Location: [facility];  Service: Orthopedics;  Laterality: Right;  ? ?HPI:  ?Ralph Jackson is 76yo male who presented to ED with R UE/LE weakness. 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 Found to have COVID-19, CXR on admission negative for active disease. PMH: tobacco abuse, HTN, hypthyroidism, HLD, GERD, dementia, and ETOH abuse PSH: R THA 10/22. 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  ?  ?Assessment / Plan / Recommendation  ?Clinical Impression ? Ralph Jackson was seen for bedside swallow evaluation. He reported that since admission, he has had the sensation of food "sticking" if he does not use a liquid wash, and that he inconsistently coughs with thin liquids. Ralph Jackson identified the sternal notch as the approximate site of the sensation. Oral mechanism exam was [facility], and he presented with reduced, but adequate dentition. Ralph Jackson demonstrated symptoms of oropharyngeal dysphagia characterized by multiple swallows with solids, and a single instance of coughing with thin liquids via straw when his positioning was suboptimal. A dysphagia 3 diet with thin liquids is recommended with observance of swallowing precautions and Ralph Jackson expressed that he would rather his meats be softer. A modified barium swallow study is recommended to further assess physiology. However, this cannot be coordinated with  radiology for today and the current plan is for the Ralph Jackson to be admitted to [facility] today. Further dysphagia assessment and intervention can therefore be completed following admission to AIR. ?SLP Visit Diagnosis: Dysphagia, unspecified (R13.10) ?   ?Aspiration Risk ? Mild aspiration risk  ?  ?Diet Recommendation Thin liquid;Dysphagia 3 (Mech soft)  ? ?Liquid Administration via: Cup;Straw ?Medication Administration: Whole meds with liquid ?Supervision: Patient able to self feed ?Compensations: Slow rate;Small sips/bites ?Postural Changes: Seated upright at 90 degrees  ?  ?Other  Recommendations Oral Care Recommendations: Oral care BID   ? ?Recommendations for follow up therapy are one component of a multi-disciplinary discharge planning process, led by the attending physician.  Recommendations may be updated based on patient status, additional functional criteria and insurance authorization. ? ?Follow up Recommendations  (TBD)  ? ? ?  ?Assistance Recommended at Discharge Intermittent Supervision/Assistance  ?Functional Status Assessment Patient has had a recent decline in their functional status and demonstrates the ability to make significant improvements in function in a reasonable and predictable amount of time.  ?Frequency and Duration min 2x/week  ?2 weeks ?  ?   ? ?Prognosis Prognosis for Safe Diet Advancement: Good  ? ?  ? ?Swallow Study   ?General Date of Onset: 11/16/21 ?HPI: Ralph Jackson is 76yo male who presented to ED with R UE/LE weakness. 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 Found to have COVID-19, CXR on admission negative for active disease. PMH: tobacco abuse, HTN, hypthyroidism, HLD, GERD, dementia, and ETOH abuse PSH: R THA 10/22. 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 ?Type of Study: Bedside Swallow Evaluation ?Previous Swallow Assessment: none ?Diet  Prior to this Study: Regular;Thin liquids ?Temperature Spikes Noted: No ?Respiratory Status: Room air ?History of Recent Intubation: No ?Behavior/Cognition:  Alert;Pleasant mood;Cooperative ?Oral Cavity Assessment: Within Functional Limits ?Oral Care Completed by SLP: No ?Oral Cavity - Dentition: Adequate natural dentition ?Vision: Functional for self-feeding ?Self-Feeding Abilities: Able to feed self ?Patient Positioning: Upright in bed;Postural control adequate for testing ?Baseline Vocal Quality: Normal ?Volitional Cough: Strong ?Volitional Swallow: Able to elicit  ?  ?Oral/Motor/Sensory Function Overall Oral Motor/Sensory Function: Within functional limits   ?Ice Chips Ice chips: Not tested   ?Thin Liquid Thin Liquid: Impaired ?Presentation: Straw ?Pharyngeal  Phase Impairments: Cough - Immediate  ?  ?Nectar Thick Nectar Thick Liquid: Not tested   ?Honey Thick Honey Thick Liquid: Not tested   ?Puree Puree: Within functional limits ?Presentation: Spoon   ?Solid ? ? ?  Solid: Impaired ?Presentation: Self Fed ?Pharyngeal Phase Impairments: Multiple swallows (c/o globus sensation)  ? ?  ?Victory Dresden I. Hardin Negus, [place], CCC-SLP ?Acute Rehabilitation Services ?Office number [phone] ?Pager [phone] ? ?Horton Marshall ?11/26/2021,10:44 AM ? ? ? ? ? ?

## 2021-11-26 NOTE — Discharge Instructions (Addendum)
Inpatient Rehab Discharge Instructions ? ?Ralph Jackson ?Discharge date and time: No discharge date for patient encounter.  ? ?Activities/Precautions/ Functional Status: ?Activity: activity as tolerated ?Diet: regular diet ?Wound Care: routine skin checks ?Functional status:  ?___ No restrictions     ___ Walk up steps independently ?___ 24/7 supervision/assistance   ___ Walk up steps with assistance ?___ Intermittent supervision/assistance  ___ Bathe/dress independently ?___ Walk with walker     __x_ Bathe/dress with assistance ?___ Walk Independently    ___ Shower independently ?___ Walk with assistance    ___ Shower with assistance ?___ No alcohol     ___ Return to work/school ________ ? ?COMMUNITY REFERRALS UPON DISCHARGE:   ? ?Home Health:   PT     OT                    Agency: [facility]  Phone: Refer to [facility]  ? ?Medical Equipment/Items Ordered: Wheelchair, Scientist, water quality ?                                                Agency/Supplier: [employer]  [phone] ? ? ?Special Instructions: ?No driving smoking or alcohol ? ?Continue aspirin 325 mg daily and Plavix 75 mg daily until 01/07/2022 then begin aspirin 81 mg daily ? ? ? ?STROKE/TIA DISCHARGE INSTRUCTIONS ?SMOKING Cigarette smoking nearly doubles your risk of having a stroke & is the single most alterable risk factor  ?If you smoke or have smoked in the last 12 months, you are advised to quit smoking for your health. Most of the excess cardiovascular risk related to smoking disappears within a year of stopping. ?Ask you doctor about anti-smoking medications ?[place] Quit Line: 1-800-QUIT NOW ?Free Smoking Cessation Classes [phone]  ?CHOLESTEROL Know your levels; limit fat & cholesterol in your diet  ?Lipid Panel  ?   ?Component Value Date/Time  ? CHOL 153 11/16/2021 0508  ? TRIG 38 11/16/2021 0508  ? HDL 55 11/16/2021 0508  ? CHOLHDL 2.8 11/16/2021 0508  ? VLDL 8 11/16/2021 0508  ? LDLCALC 90 11/16/2021 0508  ? ? ? Many  patients benefit from treatment even if their cholesterol is at goal. ?Goal: Total Cholesterol (CHOL) less than 160 ?Goal:  Triglycerides (TRIG) less than 150 ?Goal:  HDL greater than 40 ?Goal:  LDL (LDLCALC) less than 100 ?  ?BLOOD PRESSURE American Stroke Association blood pressure target is less that 120/80 mm/Hg  ?Your discharge blood pressure is:  BP: 119/71 Monitor your blood pressure ?Limit your salt and alcohol intake ?Many individuals will require more than one medication for high blood pressure  ?DIABETES (A1c is a blood sugar average for last 3 months) Goal HGBA1c is under 7% (HBGA1c is blood sugar average for last 3 months)  ?Diabetes: ?No known diagnosis of diabetes   ? ?Lab Results  ?Component Value Date  ? HGBA1C 5.0 11/16/2021  ? ? Your HGBA1c can be lowered with medications, healthy diet, and exercise. ?Check your blood sugar as directed by your physician ?Call your physician if you experience unexplained or low blood sugars.  ?PHYSICAL ACTIVITY/REHABILITATION Goal is 30 minutes at least 4 days per week  ?Activity: Increase activity slowly, ?Therapies: Physical Therapy: Home Health ?Return to work:  Activity decreases your risk of heart attack and stroke and makes your heart stronger.  It helps  control your weight and blood pressure; helps you relax and can improve your mood. ?Participate in a regular exercise program. ?Talk with your doctor about the best form of exercise for you (dancing, walking, swimming, cycling).  ?DIET/WEIGHT Goal is to maintain a healthy weight  ?Your discharge diet is:  ?Diet Order   ? ? None  ? ?  ?  liquids ?Your height is:  Height: 5\' 1"  (154.9 cm) ?Your current weight is: Weight: 41.9 kg ?Your Body Mass Index (BMI) is:  BMI (Calculated): 17.46 Following the type of diet specifically designed for you will help prevent another stroke. ?Your goal weight range is:   ?Your goal Body Mass Index (BMI) is 19-24. ?Healthy food habits can help reduce 3 risk factors for stroke:   High cholesterol, hypertension, and excess weight.  ?RESOURCES Stroke/Support Group:  Call [phone] ?  ?STROKE EDUCATION PROVIDED/REVIEWED AND GIVEN TO PATIENT Stroke warning signs and symptoms ?How to activate emergency medical system (call 911). ?Medications prescribed at discharge. ?Need for follow-up after discharge. ?Personal risk factors for stroke. ?Pneumonia vaccine given: No ?Flu vaccine given: No ?My questions have been answered, the writing is legible, and I understand these instructions.  I will adhere to these goals & educational materials that have been provided to me after my discharge from the hospital.  ? ?  ? ? ?My questions have been answered and I understand these instructions. I will adhere to these goals and the provided educational materials after my discharge from the hospital. ? ?Patient/Caregiver Signature _______________________________ Date __________ ? ?Clinician Signature _______________________________________ Date __________ ? ?Please bring this form and your medication list with you to all your follow-up doctor's appointments.   ?

## 2021-11-26 NOTE — Discharge Summary (Signed)
?Physician Discharge Summary ?  ?Patient: Ralph Jackson MRN: [id number] DOB: Apr 05, 1946  ?Admit date:     11/15/2021  ?Discharge date: 11/26/21  ?Discharge Physician: Kathlen Mody  ? ?PCP: [name], PA-C  ? ?Recommendations at discharge:  ? ?Discharge Diagnoses: ?Principal Problem: ?  CVA (cerebral vascular accident) (HCC) ?Active Problems: ?  COVID-19 virus infection ?  HTN (hypertension) ?  Hypothyroidism ?  GERD (gastroesophageal reflux disease) ?  HLD (hyperlipidemia) ?  Alcoholic dementia (HCC) ?  Tobacco abuse ?  Alcohol abuse, in remission ?  Protein-calorie malnutrition, severe (HCC) ?  Depression with anxiety ? ? ? ?Hospital Course: ?Ralph Jackson is a 76 year old male with past medical history significant for anxiety/depression, hypothyroidism, essential hypertension, GERD, hyperlipidemia, prior history of EtOH abuse in remission who initially presented to [facility] ED on 3/16 complaining of right-sided weakness, right-sided facial droop, poor appetite.  When family was assisting him during breakfast, seem he was drooling and could not keep the boost shake in his mouth.  Due to this concern, family brought him to the ED for further evaluation. ?  ?In the ED, temperature 98.9 ?F, HR 57, RR 18, BP 150/77, SPO2 100% on room air.  WBC 9.5, hemoglobin 13.3, platelets 311.  Sodium 139, potassium 4.3, chloride 102, CO2 29, glucose 145, BUN 18, creatinine 0.96.  Lipase 36, AST 24, ALT 16, total bilirubin 0.4, ammonia level 19.  Urinalysis unrevealing.  COVID-19 PCR positive.  Influenza A/B PCR negative.  CT head without contrast with small cortical/subcortical left frontal infarcts likely acute to subacute, chronic infarct and chronic microvascular ischemic changes noted.  Neurology was consulted.  TRH consulted for further evaluation and management of acute versus subacute stroke.  Patient was transferred to [facility] for stroke team evaluation. ?  ?Pt stable for discharge to CIR from medical  standpoint.  ? ?Assessment and Plan: ?CVA (cerebral vascular accident) (HCC) ?Patient presenting to the ED with right facial droop, right-sided upper/lower extremity weakness. ?  CT head without contrast with findings of small cortical/subcortical left frontal infarct likely acute to subacute.  ? MR brain without contrast with acute/subacute infarcts left frontal lobe cortex and white matter, no evidence of hemorrhagic conversion, mass affect or midline shift.   ?MRA head/neck with no intracranial large vessel occlusion, moderate to severe narrowing distal left P3 and narrowing in mid left M1 and multifocal narrowing in the mid to distal left A2 and A3 segments, no hemodynamically significant stenosis in the neck.   ?LDL 90, hemoglobin A1c 5.0.   ?TTE with LVEF 60 to 65%,  with grade 2 diastolic dysfunction. No thrombus identified.  ?--DAPT with Aspirin 325 mg p.o. daily and Plavix 75 mg p.o. daily x 3 months followed by aspirin alone ?--Crestor 20 mg p.o. daily ?-Therapy evaluations recommending CIR ?--Outpatient follow-up [facility] 4 weeks. ?- remains asymptomatic.  ?- pt looking forward to being discharged to inpatient rehab.  ? ?  ?COVID-19 virus infection ?COVID-19 PCR positive on admission.   ?CXR is negative.  ?Completed the course of Paxlovid BID  ?Airborne isolation to be discontinued on 3/26.  ?Pt asymptomatic.  ?  ?HTN (hypertension) ?BP parameters are well controlled.  ?  ?Hypothyroidism ?Tsh WNL.  ?Continue with synthroid.  ?  ?GERD (gastroesophageal reflux disease) ?Stable, on PPI.  ?  ?HLD (hyperlipidemia) ?LDL is 90. ?Discharge on crestor.  ?  ?Alcoholic dementia (HCC) ?No behavioral abnormalities.  ?No signs of withdrawal.  ?  ?Tobacco abuse ?Counseled on need  for complete cessation. ?He is refusing nicotine patch.  ?  ?Alcohol abuse, in remission ?Currently in remission.  Continue encourage cessation. ?  ?  ?Depression with anxiety ?--Lexapro 20 mg p.o. daily. ?Psychiatry consulted as pt is not eating or  drinking .  ?He denies any suicidal ideations. Repeat labs are wnl.  ? ? ?Leukocytosis:  ?Possibly reactive, he denies chest pain or any other complaints.  ?Checked UA, showing positive nitrites.  ?Will order keflex for 7 days to complete the course.  ? ? ? ?  ?Severe Malnutrition; ?Supplementations added.  ?  ?  ?Malnutrition Type: ?  ?Nutrition Problem: Severe Malnutrition ?Etiology: social / environmental circumstances ?Nutrition supplementation added.  ?  ?Malnutrition Characteristics: ?  ?Signs/Symptoms: severe muscle depletion, severe fat depletion ?  ?  ?Nutrition Interventions: ?  ?Interventions: MVI, Ensure Enlive (each supplement provides 350kcal and 20 grams of protein), Liberalize Diet ?  ?Estimated body mass index is 17.91 kg/m? as calculated from the following: ?  Height as of 06/21/21: 5\' 1"  (1.549 m). ?  Weight as of this encounter: 43 kg. ?  ? ?  ? ? ?  ? ? ?Consultants: neurology. ?Procedures performed: mri brain.   ?Disposition: Rehabilitation facility ?Diet recommendation:  ?Discharge Diet Orders (From admission, onward)  ? ?  Start     Ordered  ? 11/26/21 0000  Diet - low sodium heart healthy       ? 11/26/21 1221  ? ?  ?  ? ?  ? ?Dysphagia type 3 thin Liquid ?DISCHARGE MEDICATION: ?Allergies as of 11/26/2021   ?No Known Allergies ?  ? ?  ?Medication List  ?  ? ?STOP taking these medications   ? ?aspirin 81 MG chewable tablet ?Commonly known as: Aspirin Childrens ?Replaced by: aspirin 325 MG EC tablet ?  ?HYDROcodone-acetaminophen 5-325 MG tablet ?Commonly known as: NORCO/VICODIN ?  ?metoprolol tartrate 25 MG tablet ?Commonly known as: LOPRESSOR ?  ?polyethylene glycol 17 g packet ?Commonly known as: MIRALAX / GLYCOLAX ?  ?pravastatin 10 MG tablet ?Commonly known as: PRAVACHOL ?  ?senna 8.6 MG Tabs tablet ?Commonly known as: SENOKOT ?  ? ?  ? ?TAKE these medications   ? ?acetaminophen 500 MG tablet ?Commonly known as: TYLENOL ?Take 1,000 mg by mouth every 6 (six) hours as needed for mild pain. ?   ?aspirin 325 MG EC tablet ?Take 1 tablet (325 mg total) by mouth daily. ?Start taking on: November 27, 2021 ?Replaces: aspirin 81 MG chewable tablet ?  ?cephALEXin 500 MG capsule ?Commonly known as: KEFLEX ?Take 1 capsule (500 mg total) by mouth every 12 (twelve) hours for 7 days. ?  ?clopidogrel 75 MG tablet ?Commonly known as: PLAVIX ?Take 1 tablet (75 mg total) by mouth daily. ?Start taking on: November 27, 2021 ?  ?escitalopram 20 MG tablet ?Commonly known as: LEXAPRO ?Take 20 mg by mouth daily. ?  ?feeding supplement Liqd ?Take 237 mLs by mouth 2 (two) times daily between meals. ?What changed: Another medication with the same name was removed. Continue taking this medication, and follow the directions you see here. ?  ?levothyroxine 50 MCG tablet ?Commonly known as: SYNTHROID ?Take 50 mcg by mouth daily. ?  ?loratadine 10 MG tablet ?Commonly known as: CLARITIN ?Take 1 tablet (10 mg total) by mouth daily. ?What changed: when to take this ?  ?omeprazole 20 MG capsule ?Commonly known as: PRILOSEC ?Take 20 mg by mouth daily. ?  ?One-A-Day Mens 50+ Tabs ?Take 1 tablet by mouth daily. ?  ?  Prolia 60 MG/ML Sosy injection ?Generic drug: denosumab ?Inject 60 mg into the skin See admin instructions. Every 6 months ?  ?rosuvastatin 20 MG tablet ?Commonly known as: CRESTOR ?Take 1 tablet (20 mg total) by mouth daily. ?Start taking on: November 27, 2021 ?  ? ?  ? ? Follow-up Information   ? ? [facility]. Schedule an appointment as soon as possible for a visit in 1 month(s).   ?Specialty: Neurology ?Why: stroke clinic ?Contact information: ?[address] ?[place] [id number] ?[phone] ? ?  ?  ? ?  ?  ? ?  ? ?Discharge Exam: ?Filed Weights  ? 11/24/21 0500 11/26/21 0329  ?Weight: 43 kg 42 kg  ? ?General exam: Appears calm and comfortable  ?Respiratory system: Clear to auscultation. Respiratory effort normal. ?Cardiovascular system: S1 & S2 heard, RRR. No JVD,  No pedal  edema. ?Gastrointestinal system: Abdomen is nondistended, soft and nontender. Normal bowel sounds heard. ?Central nervous system: Alert and oriented. No focal neurological deficits. ?Extremities: Symmetric 5 x 5 pow

## 2021-11-26 NOTE — Progress Notes (Signed)
Inpatient Rehab Admissions Coordinator:   ? ?I have insurance approval and a bed available for pt to admit to CIR today. Dr. Blake Divine in agreement.  Will let pt/family and TOC team know.  ? ?Estill Dooms, PT, DPT ?Admissions Coordinator ?[phone] ?11/26/21  ?10:15 AM  ? ?

## 2021-11-26 NOTE — Care Management Important Message (Signed)
Important Message ? ?Patient Details  ?Name: Ralph Jackson ?MRN: [id number] ?Date of Birth: November 26, 1945 ? ? ?Medicare Important Message Given:  Yes ? ? ? ? ?[name] ?11/26/2021, 2:09 PM ?

## 2021-11-26 NOTE — Progress Notes (Signed)
Occupational Therapy Treatment ?Patient Details ?Name: Ralph Jackson ?MRN: [id number] ?DOB: 1945/10/10 ?Today's Date: 11/26/2021 ? ? ?History of present illness Pt is 76yo male who presented to ED with R UE/LE weakness. 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 PMH: tobacco abuse, HTN, hypthyroidism, HLD< GERD, dementia, and ETOH abuse PSH: R THA 10/22. ?  ?OT comments ? Patient received in bed and agreeable to OT session. Patient was able to get to EOB without assistance and stood from EOB to RW with min guard assist. Patient ambulated to sink with min  assist to transfer to chair for safety.  Patient performed grooming and UB bathing/dressing seated and stood for peri area cleaning. Patient is expected to discharge to [facility] for continued rehab.   ? ?Recommendations for follow up therapy are one component of a multi-disciplinary discharge planning process, led by the attending physician.  Recommendations may be updated based on patient status, additional functional criteria and insurance authorization. ?   ?Follow Up Recommendations ? Acute inpatient rehab (3hours/day)  ?  ?Assistance Recommended at Discharge Frequent or constant Supervision/Assistance  ?Patient can return home with the following ? Assistance with cooking/housework;Direct supervision/assist for medications management;Direct supervision/assist for financial management;Two people to help with walking and/or transfers;A lot of help with bathing/dressing/bathroom;Assist for transportation;Help with stairs or ramp for entrance ?  ?Equipment Recommendations ? Other (comment) (TBD)  ?  ?Recommendations for Other Services   ? ?  ?Precautions / Restrictions Precautions ?Precautions: Fall ?Restrictions ?Weight Bearing Restrictions: No  ? ? ?  ? ?Mobility Bed Mobility ?Overal bed mobility: Needs Assistance ?Bed Mobility: Sit to Supine ?  ?  ?Supine to sit: Supervision ?  ?  ?General bed mobility comments: used rail to assist ?  ? ?Transfers ?Overall  transfer level: Needs assistance ?Equipment used: Rolling walker (2 wheels) ?Transfers: Sit to/from Stand ?Sit to Stand: Min guard ?  ?  ?Step pivot transfers: Min assist ?  ?  ?General transfer comment: min guard for sit to stand, min assist for transfers due to safety ?  ?  ?Balance Overall balance assessment: Needs assistance ?Sitting-balance support: Feet supported, No upper extremity supported ?Sitting balance-Leahy Scale: Fair ?  ?  ?Standing balance support: Single extremity supported, Bilateral upper extremity supported, During functional activity ?Standing balance-Leahy Scale: Poor ?Standing balance comment: able to stand at sink for peri care ?  ?  ?  ?  ?  ?  ?  ?  ?  ?  ?  ?   ? ?ADL either performed or assessed with clinical judgement  ? ?ADL Overall ADL's : Needs assistance/impaired ?  ?  ?Grooming: Wash/dry hands;Wash/dry face;Sitting;Supervision/safety ?Grooming Details (indicate cue type and reason): seated at sink ?Upper Body Bathing: Supervision/ safety;Sitting ?Upper Body Bathing Details (indicate cue type and reason): seated at sink ?Lower Body Bathing: Moderate assistance ?Lower Body Bathing Details (indicate cue type and reason): stood at sink for peri area with assistance to complete ?Upper Body Dressing : Minimal assistance;Sitting ?Upper Body Dressing Details (indicate cue type and reason): changed gown ?  ?  ?  ?  ?  ?  ?  ?  ?Functional mobility during ADLs: Min guard ?General ADL Comments: supervision while seated and min guard for balance when standing ?  ? ?Extremity/Trunk Assessment Upper Extremity Assessment ?RUE Deficits / Details: generalized weakness compared to L ?RUE Sensation: decreased light touch ?RUE Coordination: decreased fine motor ?  ?  ?  ?  ?  ? ?Vision   ?  ?  ?  Perception   ?  ?Praxis   ?  ? ?Cognition Arousal/Alertness: Awake/alert ?Behavior During Therapy: Flat affect ?Overall Cognitive Status: Within Functional Limits for tasks assessed ?  ?  ?  ?  ?  ?  ?  ?  ?  ?   ?  ?  ?  ?  ?  ?  ?  ?  ?  ?   ?Exercises   ? ?  ?Shoulder Instructions   ? ? ?  ?General Comments    ? ? ?Pertinent Vitals/ Pain       Pain Assessment ?Pain Assessment: No/denies pain ?Pain Intervention(s): Monitored during session ? ?Home Living   ?  ?  ?  ?  ?  ?  ?  ?  ?  ?  ?  ?  ?  ?  ?  ?  ?  ?  ? ?  ?Prior Functioning/Environment    ?  ?  ?  ?   ? ?Frequency ? Min 2X/week  ? ? ? ? ?  ?Progress Toward Goals ? ?OT Goals(current goals can now be found in the care plan section) ? Progress towards OT goals: Progressing toward goals ? ?Acute Rehab OT Goals ?Patient Stated Goal: go to rehab ?OT Goal Formulation: With patient ?Time For Goal Achievement: 11/30/21 ?Potential to Achieve Goals: Good ?ADL Goals ?Pt Will Perform Grooming: with min guard assist;standing ?Pt Will Perform Lower Body Bathing: with min assist;sitting/lateral leans;sit to/from stand ?Pt Will Perform Lower Body Dressing: with min assist;sitting/lateral leans;sit to/from stand ?Pt Will Transfer to Toilet: with min assist;ambulating ?Pt Will Perform Toileting - Clothing Manipulation and hygiene: with min assist;sitting/lateral leans;sit to/from stand ?Additional ADL Goal #1: Pt will complete bed mobility with supervision as a precursor to seated ADL's.  ?Plan Discharge plan remains appropriate   ? ?Co-evaluation ? ? ?   ?  ?  ?  ?  ? ?  ?AM-PAC OT "6 Clicks" Daily Activity     ?Outcome Measure ? ? Help from another person eating meals?: None ?Help from another person taking care of personal grooming?: A Little ?Help from another person toileting, which includes using toliet, bedpan, or urinal?: A Lot ?Help from another person bathing (including washing, rinsing, drying)?: A Lot ?Help from another person to put on and taking off regular upper body clothing?: A Little ?Help from another person to put on and taking off regular lower body clothing?: A Lot ?6 Click Score: 16 ? ?  ?End of Session Equipment Utilized During Treatment: Rolling walker (2  wheels);Gait belt ? ?OT Visit Diagnosis: Unsteadiness on feet (R26.81);Other abnormalities of gait and mobility (R26.89);Muscle weakness (generalized) (M62.81);Hemiplegia and hemiparesis ?Hemiplegia - Right/Left: Right ?Hemiplegia - dominant/non-dominant: Dominant ?Hemiplegia - caused by: Cerebral infarction ?  ?Activity Tolerance Patient tolerated treatment well ?  ?Patient Left in chair;with call bell/phone within reach;with chair alarm set ?  ?Nurse Communication Mobility status ?  ? ?   ? ?Time: [id number] ?OT Time Calculation (min): 24 min ? ?Charges: OT General Charges ?$OT Visit: 1 Visit ?OT Treatments ?$Self Care/Home Management : 23-37 mins ? ?Alfonse Flavors, OTA ?Acute Rehabilitation Services  ?Pager [phone] ?Office [phone] ? ? ?Amyrah Pinkhasov Jeannett Senior ?11/26/2021, 12:54 PM ?

## 2021-11-26 NOTE — Consult Note (Signed)
[facility] New Face-to-Face Psychiatric Evaluation ? ? ?Service Date: November 26, 2021 ?LOS:  LOS: 11 days  ? ? ?Assessment  ?Ralph Jackson is a 76 y.o. male admitted medically for 11/15/2021 10:41 AM for CVA after right-sided weakness and being altered for 5 days. He carries the psychiatric diagnoses of depression and EtOH dependence in remission and has a past medical history of hypothyroidism, essential hypertension, GERD, hyperlipidemia, prior history of EtOH abuse in remission. Psychiatry was consulted for Depression by Hosie Poisson MD.  ? ? ?His current presentation of depressed mood and appetite is most consistent with COVID symptoms as they were fairly related to each other in time. 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  Current outpatient psychotropic medications include Lexapro and historically he has had a fair response to these medications. He was compliant with medications prior to admission as evidenced by his daughters report. On initial examination, patient laying in bed comfortably. Please see plan below for detailed recommendations.  ? ?Per his daughter patient has done relatively good on his Lexapro and his depressive episodes are self-limiting.  As he is not interested in changing his psychiatric medications and willing to start an appetite stimulant will not pursue psychiatric medication changes.  We recommend starting medication to stimulate his appetite and possibly his nausea as this could be decreasing how much he does eat.  He should follow up once he is discharged with his PCP to further discuss any changes to psychiatric medications. ? ? ?Diagnoses:  ?Active Hospital problems: ?Principal Problem: ?  CVA (cerebral vascular accident) ([place]) ?Active Problems: ?  Protein-calorie malnutrition, severe ([place]) ?  Alcohol abuse, in remission ?  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  HTN  (hypertension) ?  Hypothyroidism ?  GERD (gastroesophageal reflux disease) ?  Tobacco abuse ?  HLD (hyperlipidemia) ?  COVID-19 virus infection ?  Depression with anxiety ?  ? ? ?Plan  ?## Safety and Observation Level:  ?- Based on my clinical evaluation, I estimate the patient to be at low risk of self harm in the current setting ?- At this time, we recommend a routine level of observation. This decision is based on my review of the chart including patient's history and current presentation, interview of the patient, mental status examination, and consideration of suicide risk including evaluating suicidal ideation, plan, intent, suicidal or self-harm behaviors, risk factors, and protective factors. This judgment is based on our ability to directly address suicide risk, implement suicide prevention strategies and develop a safety plan while the patient is in the clinical setting. Please contact our team if there is a concern that risk level has changed. ? ? ?## Medications:  ?-Continue home Lexapro 20 mg daily ? ? ?## Medical Decision Making Capacity:  ?Not formally assessed ? ?## Further Work-up:  ?-- Recommend Swallow Study ? ? ? ?-- most recent EKG on 3/16 had QtC of 439 ?-- Pertinent labwork reviewed earlier this admission includes:  ?COVID: Positive ? ?## Disposition:  ?-- Per Primary ? ?## Behavioral / Environmental:  ?-- Assist patient in keeping his hearing aid charged so he has it available ? ?##Legal Status ? ? ?Thank you for this consult request. Recommendations have been communicated to the primary team.  We will sign off at this time.  ? ?Briant Cedar, MD ? ? ?NEW history  ?Relevant Aspects of Hospital Course:  ?Admitted on 11/15/2021  for CVA after right-sided weakness and being altered for 5 days. ? ?Patient Report:  ?He reports that he came to the hospital because he had a stroke.  When asked about his depressive symptoms he stated he felt "down" about 3-4 weeks prior to admission to the  hospital.  When asked about his appetite he confirms that he does not have an appetite.  He reports that his sense of smell and taste have not been affected with COVID.  When asked if he had any COVID symptoms prior to admission he reports none- no fever, cough, etc. When asked what he is looking forward to he reports going home.  When asked what he does at home he reports watching tv.  When asked what his favorite show is he reports Let's Make A Deal. ? ?When asked about PPHx he reports not knowing any diagnosis' (per chart review has depression and EtOH dependence).  When asked if he has been on any psychiatric medications he reports no.  When asked about the Lexapro he confirms he has been taking for an unknown amount of time and is unsure if it has been effective.  He reports not remembering any psychiatric hospitalizations (per chart review was admitted to [facility] in 2015 for EtOH Abuse).  He reports no history of self harm or suicide attempts.  He reports no known family psychiatric history. ? ?He reports that he has been sober for over 5 years but used to drink daily.  He reports using chewing tobacco.  He reports no illicit substance use. ? ?He reports only having depressed mood and low energy for the past few weeks.  He denies all other depressive symptoms. ?He reports no history of Manic symptoms, Psychotic symptoms, or history of abuse. ? ?When asked about a hearing aid he reports he has one here in the hospital and that it is rechargeable and has the recharging cable with it.  Discussed making possible changes to his anti-depressant to help with his mood and improve his appetite.  He reports that he is not interested in changes to his psychiatric medications at this time.  He reports that he is interested in starting medication to increase his appetite. ? ?He is A&O x 4 (person, place, time, and situation).  He was able to correctly do DOWB.  He answered all 4 ICAM questions correctly. ? ?He reports that he  still does have some nausea but that this is improving.  He reports no other concerns at present. ? ? ?Collateral information:  ?Called patients daughter Ralph Jackson, [phone]. ? ?She reports that she had almost no contact with father until about 6 yrs ago when he was homeless.  She reports that she and her husband took him in and he did go to a nursing home for 2 years but due to improvement he did come back to their house.  She reports that he had been drinking about 1/5th a day for about 50 yrs but has been sober for about 5 years.  She reports that this is when his depression started so she suspects he was using alcohol to self medicate his depression.  She reports this was the time he started taking Lexapro and that he did better on it.  She reports that he has "episodes" that last for 3-4 days where he will get sad and not come out of his room much and not eat much either but that otherwise he will go out with them shopping and doing other things.  When asked she confirms that he should have his hearing aid charging cable with him as she put it in his coat pocket. ? ?Discussed with her after talking with the patient he did not want to make any changes to his anti-depressant at this time.  Discussed that we had been discussing with the Primary Team about appetite stimulation medications.  Discussed that should his symptoms continue he may be more open to medication changes once he is out of the hospital and this could be brought up to his outpatient provider.  She did report that he had been trialed on Abilify at one point and that this caused him to have intense urges to drink alcohol that he had not had since becoming sober. She reports that patient is compliant with his medications as she fills a pill organizer for him and monitors that he takes his medications. ? ?She reports no other concerns at present. ? ? ?Psychiatric History:  ?Information collected from patient, chart review, and patients  daughter. ? ?Has been admitted to [facility] in 2015 for EtOH use.  Has been on Lexapro for approximately 5 yrs. ? ?Family psych history: Reports no known Diagnosis', Substance Use, or Suicides ? ? ?Social History:  ? ? ?Tobacco

## 2021-11-26 NOTE — H&P (Signed)
? ? ?Physical Medicine and Rehabilitation Admission H&P ? ?CC: Subcortical infarction ? ?HPI: Ralph Jackson is a 76 year old right-handed male with history of anxiety/depression, early dementia, hypothyroidism, hypertension, hyperlipidemia, prior history of alcohol/tobacco abuse in remission and a right total hip arthroplasty 06/21/2021.  Per chart review patient was with daughter and son-in-law .  Two-level home 4 steps to entry.  Daughter does assist with some ADLs.  Patient does not drive.  Presented 11/15/2021 with acute onset of right side weakness.  Admission chemistries hemoglobin 13.3, platelets 311,000, BUN 18, creatinine 0.96, COVID-19 PCR positive, influenza A/B PCR negative.  Cranial CT scan showed small cortical/subcortical left frontal infarct likely acute to subacute.  CT angiogram head and neck showed atherosclerotic disease of both carotid siphon regions but no stenosis greater than 30%.  MRI acute/subacute infarct left frontal lobe cortex and white matter.  No evidence of hemorrhagic conversion.  No mass effect or midline shift.  MRA of head and neck with no intracranial large vessel occlusion.  Neurology follow-up maintained on aspirin 325 mg daily and Plavix 75 mg daily for 90 days then aspirin 81 mg daily alone.  Lovenox added for DVT prophylaxis.  Patient did complete course of Paxlovid twice daily for COVID-19 infection Airborne isolation discontinued 3/26.  Chest x-ray was negative.  Tolerating a regular consistency diet but has had very poor intake .  Urinalysis 11/25/2021 positive nitrite with urine culture pending placed on Keflex empirically.  Psychiatry service consulted in regards to patient's history of depression and anxiety therapy evaluations completed due to patient's right side weakness was admitted for a comprehensive rehab program. Currently complains of constipation. ? ?Review of Systems  ?Constitutional:  Negative for chills and fever.  ?HENT:  Negative for hearing loss.    ?Eyes:  Negative for blurred vision and double vision.  ?Respiratory:  Negative for cough and shortness of breath.   ?Cardiovascular:  Negative for chest pain and palpitations.  ?Gastrointestinal:  Positive for constipation. Negative for heartburn, nausea and vomiting.  ?Genitourinary:  Negative for dysuria, flank pain and hematuria.  ?Musculoskeletal:  Positive for joint pain and myalgias.  ?Skin:  Negative for rash.  ?Neurological:  Positive for weakness.  ?Psychiatric/Behavioral:  Positive for depression.   ?     Anxiety  ?All other systems reviewed and are negative. ?Past Medical History:  ?Diagnosis Date  ? Depression   ? ETOH abuse   ? In remission for several years now  ? Hypertension   ? Urinary tract infection   ? ?Past Surgical History:  ?Procedure Laterality Date  ? TOTAL HIP ARTHROPLASTY Right 06/21/2021  ? Procedure: TOTAL HIP ARTHROPLASTY ANTERIOR APPROACH;  Surgeon: Samson Frederic, MD;  Location: [facility];  Service: Orthopedics;  Laterality: Right;  ? ?Family History  ?Problem Relation Age of Onset  ? Osteoporosis Neg Hx   ? ?Social History:  reports that he has never smoked. He uses smokeless tobacco. He reports that he does not currently use alcohol after a past usage of about 6.0 standard drinks per week. He reports that he does not use drugs. ?Allergies: No Known Allergies ?Medications Prior to Admission  ?Medication Sig Dispense Refill  ? acetaminophen (TYLENOL) 500 MG tablet Take 1,000 mg by mouth every 6 (six) hours as needed for mild pain.    ? [START ON 11/27/2021] aspirin EC 325 MG EC tablet Take 1 tablet (325 mg total) by mouth daily. 30 tablet 3  ? cephALEXin (KEFLEX) 500 MG capsule Take 1 capsule (500 mg  total) by mouth every 12 (twelve) hours for 7 days. 14 capsule 0  ? [START ON 11/27/2021] clopidogrel (PLAVIX) 75 MG tablet Take 1 tablet (75 mg total) by mouth daily. 30 tablet 2  ? escitalopram (LEXAPRO) 20 MG tablet Take 20 mg by mouth daily.    ? feeding supplement (ENSURE ENLIVE /  ENSURE PLUS) LIQD Take 237 mLs by mouth 2 (two) times daily between meals. 237 mL 12  ? levothyroxine (SYNTHROID) 50 MCG tablet Take 50 mcg by mouth daily.    ? loratadine (CLARITIN) 10 MG tablet Take 1 tablet (10 mg total) by mouth daily. (Patient taking differently: Take 10 mg by mouth at bedtime.) 15 tablet 0  ? Multiple Vitamins-Minerals (ONE-A-DAY MENS 50+) TABS Take 1 tablet by mouth daily.    ? omeprazole (PRILOSEC) 20 MG capsule Take 20 mg by mouth daily.    ? PROLIA 60 MG/ML SOSY injection Inject 60 mg into the skin See admin instructions. Every 6 months    ? [START ON 11/27/2021] rosuvastatin (CRESTOR) 20 MG tablet Take 1 tablet (20 mg total) by mouth daily. 30 tablet 1  ? ? ?Home: ?Home Living ?Family/patient expects to be discharged to:: Private residence ?Living Arrangements: Children (dtr and son-in-law) ?Available Help at Discharge: Family, Available 24 hours/day ?Type of Home: House ?Home Access: Stairs to enter ?Entrance Stairs-Number of Steps: 4 ?Entrance Stairs-Rails: Left, Right ?Home Layout: Two level ?Alternate Level Stairs-Number of Steps: flight ?Alternate Level Stairs-Rails: Right ?Bathroom Shower/Tub: Tub/shower unit ?Bathroom Toilet: Standard ?Bathroom Accessibility: Yes ?Home Equipment: Agricultural consultant (2 wheels), Wheelchair - manual, Tub bench ? Lives With: Daughter ?  ?Functional History: ?Prior Function ?Prior Level of Function : Independent/Modified Independent ?Mobility Comments: pt reports not use any AD, doesn't drive, per daughter stairs was a struggle ?ADLs Comments: reports independence ?  ?Functional Status:  ?Mobility: ?Bed Mobility ?Overal bed mobility: Needs Assistance ?Bed Mobility: Sit to Supine ?Supine to sit: Supervision ?Sit to supine: Supervision ?General bed mobility comments: increased time to perform ?Transfers ?Overall transfer level: Needs assistance ?Equipment used: Rolling walker (2 wheels) ?Transfers: Sit to/from Stand ?Sit to Stand: Min assist ?Bed to/from  chair/wheelchair/BSC transfer type:: Step pivot ?Step pivot transfers: Min assist, Mod assist ?General transfer comment: min/mod assist for step pivot to eob ?Ambulation/Gait ?Ambulation/Gait assistance: Min assist ?Gait Distance (Feet): 30 Feet (x 2) ?Assistive device: Rolling walker (2 wheels) (youth RW) ?Gait Pattern/deviations: Step-to pattern, Knee flexed in stance - right, Antalgic, Decreased step length - right, Trendelenburg, Trunk flexed ?General Gait Details: Assist for balance and support and maximodal cues to extend through rt knee and hip. Significant trendelenburg - likely due to stroke and hip weakness after THR. ?Gait velocity: decr ?Gait velocity interpretation: <1.8 ft/sec, indicate of risk for recurrent falls ?  ?ADL: ?ADL ?Overall ADL's : Needs assistance/impaired ?Eating/Feeding: Set up, Sitting ?Grooming: Armed forces technical officer, Therapist, nutritional, Min guard, Standing ?Grooming Details (indicate cue type and reason): min guard assist for balance ?Upper Body Bathing: Minimal assistance, Standing ?Upper Body Bathing Details (indicate cue type and reason): performed standing at sink ?Lower Body Bathing: Moderate assistance ?Lower Body Bathing Details (indicate cue type and reason): stood for peri area cleaning with assistance for bottom ?Upper Body Dressing : Minimal assistance, Sitting ?Upper Body Dressing Details (indicate cue type and reason): changed gown ?Lower Body Dressing: Maximal assistance, Sitting/lateral leans, Sit to/from stand ?Toilet Transfer: Minimal assistance, Grab bars, Rolling walker (2 wheels) ?Toilet Transfer Details (indicate cue type and reason): ambulated to toilet and used  rail to lower ?Toileting- Clothing Manipulation and Hygiene: Moderate assistance ?Toileting - Clothing Manipulation Details (indicate cue type and reason): requried assistance to complete ?Functional mobility during ADLs: Maximal assistance, Rolling walker (2 wheels) ?General ADL Comments: cues for safety ?   ?Cognition: ?Cognition ?Overall Cognitive Status: Within Functional Limits for tasks assessed ?Arousal/Alertness: Awake/alert ?Orientation Level: Oriented X4 ?Attention: Sustained ?Sustained Attention: Appears intac

## 2021-11-26 NOTE — Progress Notes (Signed)
Patient ID: Ralph Jackson, male   DOB: 1946/07/09, 76 y.o.   MRN: [id number] ? ?Pt arrived to 5C16 per wheelchair. Pt oriented to rehab and policies reviewed. Pt in agreement. Skin assessment completed with RN. Vitals obtained. Call light given to patient within reach, bed in lowest position, bed alarm on.  ?No complications noted. ?Mylo Red, LPN  ?

## 2021-11-26 NOTE — Final Progress Note (Signed)
PMR Admission Coordinator Pre-Admission Assessment ?  ?Patient: Ralph Jackson is an 76 y.o., male ?MRN: [id number] ?DOB: [birth year] ?Height:   ?Weight:   ?  ?Insurance Information ?HMO: yes    PPO:      PCP:      IPA:      80/20:      OTHER:  ?PRIMARY: [employer] Medicare      Policy#: [id number]      Subscriber: pt ?CM Name: Philandrea      Phone#: [phone] opt [phone]     Fax#: [phone] ?Pre-Cert#: [id number] auth for CIR provided by Philandrea at [facility] with updates due to fax listed above on 3/30.      Employer:  ?Benefits:  Phone #: [phone]     Name:  ?Eff. Date: 09/02/21     Deduct: $0      Out of Pocket Max: $8300      Life Max: n/a ?CIR: $1556/admit      SNF: 20 full days ?Outpatient: 80%     Co-Ins: 20% ?Home Health: 100%      Co-Pay:  ?DME: 80%     Co-Ins: 20% ?Providers:  ?SECONDARY: Medicaid      Policy#: [id number]     Phone#:  ?  ?Financial Counselor:       Phone#:  ?  ?The ?Data Collection Information Summary? for patients in Inpatient Rehabilitation Facilities with attached ?Privacy Act [place] Records? was provided and verbally reviewed with: Patient and Family ?  ?Emergency Contact Information ?Contact Information   ?  ?  Name Relation Home Work Mobile  ?  Bohle,Jennifier Daughter [phone]      ?  ?   ?  ?  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  Presented to [facility] on 11/15/2021 with acute onset of right side weakness.  Admission chemistries hemoglobin 13.3, platelets 311,000, BUN 18, creatinine 0.96, COVID-19 PCR positive, influenza A/B PCR negative.  Cranial CT scan showed small cortical/subcortical left frontal infarct likely acute to subacute.  CT angiogram head and neck  showed atherosclerotic disease of both carotid siphon regions but no stenosis greater than 30%.  MRI acute/subacute infarct left frontal lobe cortex and white matter.  No evidence of hemorrhagic conversion.  No mass effect or midline shift.  MRA of head and neck with no intracranial large vessel occlusion.  Neurology follow-up maintained on aspirin 325 mg daily and Plavix 75 mg daily for 90 days then aspirin 81 mg daily alone.  Lovenox added for DVT prophylaxis.  Patient did complete course of Paxlovid twice daily for COVID-19 infection Airborne isolation discontinued 3/26.  Chest x-ray was negative.  Tolerating a regular consistency diet.  Therapy evaluations completed recommendations were for CIR ?  ?Complete NIHSS TOTAL: 3 ?  ?Patient's medical record from [facility] has been reviewed by the rehabilitation admission coordinator and physician. ?  ?Past Medical History  ?    ?Past Medical History:  ?Diagnosis Date  ? Depression    ? ETOH abuse    ?  In remission for several years now  ? Hypertension    ? Urinary tract infection    ?  ?  ?Has the patient had major surgery during 100 days prior to admission? No ?  ?Family History   ?family  history is not on file. ?  ?Current Medications ?  ?Current Facility-Administered Medications:  ?  acetaminophen (TYLENOL) tablet 650 mg, 650 mg, Oral, Q4H PRN **OR** acetaminophen (TYLENOL) 160 MG/5ML solution 650 mg, 650 mg, Per Tube, Q4H PRN **OR** acetaminophen (TYLENOL) suppository 650 mg, 650 mg, Rectal, Q4H PRN, Marylyn Ishihara, Tyrone A, DO ?  aspirin EC tablet 325 mg, 325 mg, Oral, Daily, Rosalin Hawking, MD, 325 mg at 11/22/21 1044 ?  clopidogrel (PLAVIX) tablet 75 mg, 75 mg, Oral, Daily, Corky Sox, MD, 75 mg at 11/22/21 1044 ?  enoxaparin (LOVENOX) injection 30 mg, 30 mg, Subcutaneous, Q24H, Jones, Jessica L, NP, 30 mg at 11/21/21 2104 ?  escitalopram (LEXAPRO) tablet 20 mg, 20 mg, Oral, Daily, Kyle, Tyrone A, DO, 20 mg at 11/22/21 1044 ?  feeding supplement (ENSURE ENLIVE /  ENSURE PLUS) liquid 237 mL, 237 mL, Oral, BID BM, [place] Donnamarie Poag, DO, 237 mL at 11/22/21 1428 ?  levothyroxine (SYNTHROID) tablet 50 mcg, 50 mcg, Oral, Daily, Kyle, Tyrone A, DO, 50 mcg at 11/22/21 [id number] ?  multivitamin with minerals tablet 1 tablet, 1 tablet, Oral, Daily, [place] Donnamarie Poag, DO, 1 tablet at 11/22/21 1044 ?  pantoprazole (PROTONIX) EC tablet 40 mg, 40 mg, Oral, Daily, [place] Donnamarie Poag, DO, 40 mg at 11/22/21 1043 ?  rosuvastatin (CRESTOR) tablet 20 mg, 20 mg, Oral, Daily, Rosalin Hawking, MD, 20 mg at 11/22/21 1044 ?  ?Patients Current Diet:  ?Diet Order   ?  ?         ?    Diet regular Room service appropriate? Yes with Assist; Fluid consistency: Thin  Diet effective now       ?  ?  ?   ?  ?  ?   ?  ?  ?Precautions / Restrictions ?Precautions ?Precautions: Fall ?Precaution Comments: COVID+ ?Restrictions ?Weight Bearing Restrictions: No  ?  ?Has the patient had 2 or more falls or a fall with injury in the past year? Yes ?  ?Prior Activity Level ?Household: per daughter, no DME used at baseline but requiring supervision for ADLs/mobility, difficulty on the stairs baseline ?  ?Prior Functional Level ?Self Care: Did the patient need help bathing, dressing, using the toilet or eating? Needed some help ?  ?Indoor Mobility: Did the patient need assistance with walking from room to room (with or without device)? Needed some help ?  ?Stairs: Did the patient need assistance with internal or external stairs (with or without device)? Needed some help ?  ?Functional Cognition: Did the patient need help planning regular tasks such as shopping or remembering to take medications? Needed some help ?  ?Patient Information ?Are you of Hispanic, Latino/a,or Spanish origin?: A. No, not of Hispanic, Latino/a, or Spanish origin ?What is your race?: A. White ?Do you need or want an interpreter to communicate with a doctor or health care staff?: 0. No ?  ?Patient's Response To:  ?Health Literacy and Transportation ?Is the patient  able to respond to health literacy and transportation needs?: Yes ?Health Literacy - How often do you need to have someone help you when you read instructions, pamphlets, or other written material from your doctor or pharmacy?: Sometimes ?In the past 12 months, has lack of transportation kept you from medical appointments or from getting medications?: No ?In the past 12 months, has lack of transportation kept you from meetings, work, or from getting things needed for daily living?: No ?  ?Home Assistive Devices / Equipment ?Home Assistive Devices/Equipment: None ?  Home Equipment: Conservation officer, nature (2 wheels), Wheelchair - manual, Tub bench ?  ?Prior Device Use: Indicate devices/aids used by the patient prior to current illness, exacerbation or injury? Walker ?  ?Current Functional Level ?Cognition ?  Arousal/Alertness: Awake/alert ?Overall Cognitive Status: Within Functional Limits for tasks assessed ?Orientation Level: Oriented X4 ?General Comments: followed directions well (Simultaneous filing. User may not have seen previous data.) ?Attention: Sustained ?Sustained Attention: Appears intact ?Memory: Appears intact ?   ?Extremity Assessment ?(includes Sensation/Coordination) ?  Upper Extremity Assessment: RUE deficits/detail ?RUE Deficits / Details: generalized weakness compared to L ?RUE Sensation: decreased light touch ?RUE Coordination: decreased fine motor  ?Lower Extremity Assessment: Defer to PT evaluation ?RLE Deficits / Details: generalized weakness compared to L, hold in slight R hip and knee flexion  ?   ?ADLs ?  Overall ADL's : Needs assistance/impaired ?Eating/Feeding: Set up, Sitting ?Grooming: Wash/dry hands, Wash/dry face, Minimal assistance, Standing ?Grooming Details (indicate cue type and reason): min assist for balance standing at sink ?Upper Body Bathing: Minimal assistance, Standing ?Upper Body Bathing Details (indicate cue type and reason): performed standing at sink ?Lower Body Bathing: Moderate  assistance ?Lower Body Bathing Details (indicate cue type and reason): stood for peri area cleaning with assistance for bottom ?Upper Body Dressing : Minimal assistance, Sitting ?Upper Body Dressing Detail

## 2021-11-26 NOTE — TOC Transition Note (Signed)
Transition of Care (TOC) - CM/SW Discharge Note ? ? ?Patient Details  ?Name: SAHAN PEN ?MRN: [id number] ?Date of Birth: 04-Feb-1946 ? ?Transition of Care (TOC) CM/SW Contact:  ?Kermit Balo, RN ?Phone Number: ?11/26/2021, 12:24 PM ? ? ?Clinical Narrative:    ?Grayland Jack is discharging to CIR today. CM signing off.  ? ? ?Final next level of care: IP Rehab Facility ?Barriers to Discharge: No Barriers Identified ? ? ?Patient Goals and CMS Choice ?  ?  ?Choice offered to / list presented to : Patient ? ?Discharge Placement ?  ?           ?  ?  ?  ?  ? ?Discharge Plan and Services ?  ?  ?           ?  ?  ?  ?  ?  ?  ?  ?  ?  ?  ? ?Social Determinants of Health (SDOH) Interventions ?  ? ? ?Readmission Risk Interventions ?   ? View : No data to display.  ?  ?  ?  ? ? ? ? ? ?

## 2021-11-27 LAB — COMPREHENSIVE METABOLIC PANEL
ALT: 26 U/L (ref 0–44)
AST: 18 U/L (ref 15–41)
Albumin: 2.9 g/dL — ABNORMAL LOW (ref 3.5–5.0)
Alkaline Phosphatase: 50 U/L (ref 38–126)
Anion gap: 5 (ref 5–15)
BUN: 30 mg/dL — ABNORMAL HIGH (ref 8–23)
CO2: 30 mmol/L (ref 22–32)
Calcium: 8.4 mg/dL — ABNORMAL LOW (ref 8.9–10.3)
Chloride: 105 mmol/L (ref 98–111)
Creatinine, Ser: 0.75 mg/dL (ref 0.61–1.24)
GFR, Estimated: >60 mL/min (ref >60)
Glucose, Bld: 84 mg/dL (ref 70–99)
Potassium: 3.9 mmol/L (ref 3.5–5.1)
Sodium: 140 mmol/L (ref 135–145)
Total Bilirubin: 0.5 mg/dL (ref 0.3–1.2)
Total Protein: 5.7 g/dL — ABNORMAL LOW (ref 6.5–8.1)

## 2021-11-27 LAB — CBC WITH DIFFERENTIAL/PLATELET
Abs Immature Granulocytes: 0.04 10*3/uL (ref 0.00–0.07)
Basophils Absolute: 0 10*3/uL (ref 0.0–0.1)
Basophils Relative: 0 %
Eosinophils Absolute: 0.1 10*3/uL (ref 0.0–0.5)
Eosinophils Relative: 1 %
HCT: 31.8 % — ABNORMAL LOW (ref 39.0–52.0)
Hemoglobin: 10.6 g/dL — ABNORMAL LOW (ref 13.0–17.0)
Immature Granulocytes: 0 %
Lymphocytes Relative: 27 %
Lymphs Abs: 2.6 10*3/uL (ref 0.7–4.0)
MCH: 30.5 pg (ref 26.0–34.0)
MCHC: 33.3 g/dL (ref 30.0–36.0)
MCV: 91.6 fL (ref 80.0–100.0)
Monocytes Absolute: 1.2 10*3/uL — ABNORMAL HIGH (ref 0.1–1.0)
Monocytes Relative: 13 %
Neutro Abs: 5.6 10*3/uL (ref 1.7–7.7)
Neutrophils Relative %: 59 %
Platelets: 271 10*3/uL (ref 150–400)
RBC: 3.47 MIL/uL — ABNORMAL LOW (ref 4.22–5.81)
RDW: 13.8 % (ref 11.5–15.5)
WBC: 9.5 10*3/uL (ref 4.0–10.5)
nRBC: 0 % (ref 0.0–0.2)

## 2021-11-27 LAB — URINE CULTURE

## 2021-11-27 MED ORDER — MEGESTROL ACETATE 400 MG/10ML PO SUSP
400.0000 mg | Freq: Two times a day (BID) | ORAL | Status: DC
  Administered 2021-11-27 – 2021-12-13 (×33): 400 mg via ORAL
  Filled 2021-11-27 (×33): qty 10

## 2021-11-27 NOTE — Progress Notes (Signed)
Inpatient Rehabilitation Center ?Individual Statement of Services ? ?Patient Name:  Ralph Jackson  ?Date:  11/27/2021 ? ?Welcome to the Inpatient Rehabilitation Center.  Our goal is to provide you with an individualized program based on your diagnosis and situation, designed to meet your specific needs.  With this comprehensive rehabilitation program, you will be expected to participate in at least 3 hours of rehabilitation therapies Monday-Friday, with modified therapy programming on the weekends. ? ?Your rehabilitation program will include the following services:  Physical Therapy (PT), Occupational Therapy (OT), Speech Therapy (ST), 24 hour per day rehabilitation nursing, Therapeutic Recreaction (TR), Neuropsychology, Care Coordinator, Rehabilitation Medicine, Nutrition Services, Pharmacy Services, and Other ? ?Weekly team conferences will be held on Wednesdays to discuss your progress.  Your Inpatient Rehabilitation Care Coordinator will talk with you frequently to get your input and to update you on team discussions.  Team conferences with you and your family in attendance may also be held. ? ?Expected length of stay: 10-14 Days  Overall anticipated outcome:  Supervision ? ?Depending on your progress and recovery, your program may change. Your Inpatient Rehabilitation Care Coordinator will coordinate services and will keep you informed of any changes. Your Inpatient Rehabilitation Care Coordinator's name and contact numbers are listed  below. ? ?The following services may also be recommended but are not provided by the Inpatient Rehabilitation Center:  ? ?Home Health Rehabiltiation Services ?Outpatient Rehabilitation Services ? ?  ?Arrangements will be made to provide these services after discharge if needed.  Arrangements include referral to agencies that provide these services. ? ?Your insurance has been verified to be:  [facility] Medicare  ?Your primary doctor is:  [name], PA-C ? ?Pertinent  information will be shared with your doctor and your insurance company. ? ?Inpatient Rehabilitation Care Coordinator:  Lavera Guise, [place] [phone] or ([phone] ? ?Information discussed with and copy given to patient by: Andria Rhein, 11/27/2021, 1:22 PM    ?

## 2021-11-27 NOTE — Evaluation (Signed)
Speech Language Pathology Assessment and Plan ? ?Patient Details  ?Name: Ralph Jackson ?MRN: [id number] ?Date of Birth: Jun 14, 1946 ? ?SLP Diagnosis: Cognitive Impairments;Dysphagia  ?Rehab Potential: Fair ?ELOS: 10-12 days  ? ?Today's Date: 11/27/2021 ?SLP Individual Time: 0800-0900 ?SLP Individual Time Calculation (min): 60 min ? ?Hospital Problem: Principal Problem: ?  Subcortical infarction [facility]) ? ?Past Medical History:  ?Past Medical History:  ?Diagnosis Date  ? Depression   ? ETOH abuse   ? In remission for several years now  ? Hypertension   ? Urinary tract infection   ? ?Past Surgical History:  ?Past Surgical History:  ?Procedure Laterality Date  ? TOTAL HIP ARTHROPLASTY Right 06/21/2021  ? Procedure: TOTAL HIP ARTHROPLASTY ANTERIOR APPROACH;  Surgeon: Rod Can, MD;  Location: [facility] ORS;  Service: Orthopedics;  Laterality: Right;  ? ? ?Assessment / Plan / Recommendation ?Clinical Impression Ralph Jackson is a 76 year old right-handed male with history of anxiety/depression, early dementia, hypothyroidism, hypertension, hyperlipidemia, prior history of alcohol/tobacco abuse in remission and a right total hip arthroplasty 06/21/2021.  Per chart review patient was with daughter and son-in-law. Two-level home 4 steps to entry.  Daughter does assist with some ADLs.  Patient does not drive.  Presented 11/15/2021 with acute onset of right side weakness.  Admission chemistries hemoglobin 13.3, platelets 311,000, BUN 18, creatinine 0.96, COVID-19 PCR positive, influenza A/B PCR negative.  Cranial CT scan showed small cortical/subcortical left frontal infarct likely acute to subacute. MRI acute/subacute infarct left frontal lobe cortex and white matter.  No evidence of hemorrhagic conversion. Patient did complete course of Paxlovid twice daily for COVID-19 infection Airborne isolation discontinued 3/26.  Chest x-ray was negative.  Tolerating a regular consistency diet but has had very poor intake .  Urinalysis 11/25/2021 positive nitrite with urine culture pending placed on Keflex empirically.  Psychiatry service consulted in regards to patient's history of depression and anxiety therapy evaluations completed due to patient's right side weakness was admitted for a comprehensive rehab program. Currently complains of constipation. ? ?Patient demonstrates cognitive impairments characterized by decreased problem solving, recall of functional information, and emergent awareness which impacts his safety with functional and familiar tasks. Per H&P, patient has history of early dementia and received some assistance with ADLs/iADLs at prior level. Family was not present to determine cognitive baseline; recommend contact with family for further discussion. SLP administered the [facility] Mental Status Examination (SLUMS) and patient scored 10/30 points with a score of 27 or above considered within normal limits. Patient's overall auditory comprehension and verbal expression appeared [facility] for all tasks assessed and patient was 100% intelligible at the conversation level.  ? ?Patient exhibited mildly prolonged mastication with solid textures, adequate oral clearance, and no overt s/sx of aspiration. Pt denied sensation of pharyngeal retention with PO trials, however endorsed intermittent sensation of food "going down slow" since CVA. Pt reported preference for softer meats secondary to sparse dentition. Pt consumed single and sequential sips of thin liquids with what appeared to be timely swallow initiation and without overt s/sx of aspiration. Pt also tolerated whole pills with liquid rinse when taken one at a time. A dysphagia 3 diet and thin liquids is recommended with intermittent supervision during meals. Will continue to follow for tolerance.  ? ?Patient would benefit from skilled SLP intervention to maximize cognitive and swallow functioning and overall functional independence prior to discharge.  ?   ?Skilled Therapeutic Interventions          Pt participated in [employer] [place] Mental Status Examination (SLUMS) as well as further non-standardized assessments of cognitive-linguistic, speech, and language function. Please see above.    ?  ?SLP Assessment ? Patient will need skilled Speech Lanaguage Pathology Services during CIR admission  ?  ?Recommendations ? SLP Diet Recommendations: Dysphagia 3 (Mech soft);Thin ?Liquid Administration via: Cup;Straw ?Medication Administration: Whole meds with liquid ?Supervision: Patient able to self feed;Intermittent supervision to cue for compensatory strategies ?Compensations: Slow rate;Small sips/bites ?Postural Changes and/or Swallow Maneuvers: Seated upright 90 degrees ?Oral Care Recommendations: Oral care BID ?Patient destination: Home ?Follow up Recommendations: 24 hour supervision/assistance;Home Health SLP ?Equipment Recommended: To be determined  ?  ?SLP Frequency 3 to 5 out of 7 days   ?SLP Duration ? ?SLP Intensity ? ?SLP Treatment/Interventions 10-12 days ? ?Minumum of 1-2 x/day, 30 to 90 minutes ? ?Cognitive remediation/compensation;Cueing hierarchy;Dysphagia/aspiration precaution training;Functional tasks;Internal/external aids;Patient/family education;Therapeutic Activities   ? ?Pain ?  ? ?Prior Functioning ?Type of Home: House ? Lives With: Daughter ?Available Help at Discharge: Family ? ?SLP Evaluation ?Cognition ?Overall Cognitive Status: No family/caregiver present to determine baseline cognitive functioning ?Arousal/Alertness: Awake/alert ?Sustained Attention: Appears intact ?Memory: Impaired ?Memory Impairment: Storage deficit;Decreased recall of new information;Decreased short term memory ?Decreased Short Term Memory: Verbal basic;Functional basic ?Awareness: Impaired ?Awareness Impairment: Intellectual impairment ?Problem Solving: Impaired ?Problem Solving Impairment: Verbal basic;Functional basic ?Safety/Judgment: Impaired ?Comments: mild  impuslvity with mobility, poor thoroughness of self-care bathing/grooming/hygiene tasks  ?Comprehension ?  ?Expression ?Written Expression ?Dominant Hand: Right ?Oral Motor ?  ? ?Care Tool ?Care Tool Cognition ?Ability to hear (with hearing aid or hearing appliances if normally used Ability to hear (with hearing aid or hearing appliances if normally used): 2. Moderate difficulty - speaker has to increase volume and speak distinctly ?  ?Expression of Ideas and Wants Expression of Ideas and Wants: 3. Some difficulty - exhibits some difficulty with expressing needs and ideas (e.g, some words or finishing thoughts) or speech is not clear ?  ?Understanding Verbal and Non-Verbal Content Understanding Verbal and Non-Verbal Content: 3. Usually understands - understands most conversations, but misses some part/intent of message. Requires cues at times to understand  ?Memory/Recall Ability Memory/Recall Ability : That he or she is in a hospital/hospital unit;Current season  ? ?  ? ?Bedside Swallowing Assessment ?General ?Date of Onset: 11/16/21 ?Previous Swallow Assessment: 3/27 BSE ?Respiratory Status: Room air ?History of Recent Intubation: No ?Oral Cavity - Dentition: Missing dentition;Poor condition ?Vision: Functional for self-feeding ?Patient Positioning: Upright in bed ?Baseline Vocal Quality: Normal ?Volitional Cough: Strong ?Volitional Swallow: Able to elicit  ?Oral Care Assessment ?  ?Ice Chips ?Ice chips: Not tested ?Thin Liquid ?Thin Liquid: Impaired ?Presentation: Straw ?Pharyngeal  Phase Impairments: Throat Clearing - Delayed ?Nectar Thick ?Nectar Thick Liquid: Not tested ?Honey Thick ?Honey Thick Liquid: Not tested ?Puree ?Puree: Within functional limits ?Presentation: Spoon ?Solid ?Solid: Impaired ?Presentation: Self Fed ?Oral Phase Impairments: Impaired mastication ?BSE Assessment ?Risk for Aspiration ?Impact on safety and function: Mild aspiration risk ?Other Related Risk Factors: History of GERD ? ?Short  Term Goals: ?Week 1: SLP Short Term Goal 1 (Week 1): STG=LTG due to ELOS ? ?Refer to Care Plan for Long Term Goals ? ?Recommendations for other services: None  ? ?Discharge Criteria: Patient will be discharged from SLP if

## 2021-11-27 NOTE — Progress Notes (Signed)
Inpatient Rehabilitation  Patient information reviewed and entered into eRehab system by Nas Wafer Iain Sawchuk, OTR/L.   Information including medical coding, functional ability and quality indicators will be reviewed and updated through discharge.    

## 2021-11-27 NOTE — Progress Notes (Signed)
Inpatient Rehabilitation Care Coordinator ?Assessment and Plan ?Patient Details  ?Name: Ralph Jackson ?MRN: [id number] ?Date of Birth: 1945-11-03 ? ?Today's Date: 11/27/2021 ? ?Hospital Problems: Principal Problem: ?  Subcortical infarction [facility]) ? ?Past Medical History:  ?Past Medical History:  ?Diagnosis Date  ? Depression   ? ETOH abuse   ? In remission for several years now  ? Hypertension   ? Urinary tract infection   ? ?Past Surgical History:  ?Past Surgical History:  ?Procedure Laterality Date  ? TOTAL HIP ARTHROPLASTY Right 06/21/2021  ? Procedure: TOTAL HIP ARTHROPLASTY ANTERIOR APPROACH;  Surgeon: Samson Frederic, MD;  Location: [facility];  Service: Orthopedics;  Laterality: Right;  ? ?Social History:  reports that he has never smoked. He uses smokeless tobacco. He reports that he does not currently use alcohol after a past usage of about 6.0 standard drinks per week. He reports that he does not use drugs. ? ?Family / Support Systems ?Children: Bronson Bressman (daughter) ?Anticipated Caregiver: jennifer ?Ability/Limitations of Caregiver: supvision (mobility), Min A ( ADLS) ?Caregiver Availability: 24/7 ?Family Dynamics: patient lives with daughter and son in law ? ?Social History ?Preferred language: English ?Religion: Jewish ?Health Literacy - How often do you need to have someone help you when you read instructions, pamphlets, or other written material from your doctor or pharmacy?: Always ?Writes: Yes ?Guardian/Conservator: Amanda Pea  ? ?Abuse/Neglect ?Abuse/Neglect Assessment Can Be Completed: Yes ?Physical Abuse: Denies ?Verbal Abuse: Denies ?Sexual Abuse: Denies ?Exploitation of patient/patient's resources: Denies ?Self-Neglect: Denies ? ?Patient response to: ?Social Isolation - How often do you feel lonely or isolated from those around you?: Never ? ?Emotional Status ?Recent Psychosocial Issues: hx of depression ?Psychiatric History: hx of depression ? ?Patient / Family Perceptions,  Expectations & Goals ?Pt/Family understanding of illness & functional limitations: yes ?Premorbid pt/family roles/activities: patient previously requiring assistance from family ?Anticipated changes in roles/activities/participation: Family plans to continue, able to manage medication, daughter concered about providing full supervision ?Pt/family expectations/goals: Supervision ? ?Community Resources ?Community Agencies: None ?Premorbid Home Care/DME Agencies: Other (Comment) ([employer]) ?Transportation available at discharge: daughter ?Is the patient able to respond to transportation needs?: Yes ?In the past 12 months, has lack of transportation kept you from medical appointments or from getting medications?: No ?In the past 12 months, has lack of transportation kept you from meetings, work, or from getting things needed for daily living?: No ?Resource referrals recommended: Neuropsychology ? ?Discharge Planning ?Living Arrangements: Children ?Support Systems: Children, Friends/neighbors ?Type of Residence: Private residence (Patient sleeping and doing ADL care upstairs) ?Insurance Resources: Media planner (specify) ?Financial Resources: Family Support ?Financial Screen Referred: No ?Living Expenses: Lives with family ?Money Management: Family ?Does the patient have any problems obtaining your medications?: No ?Home Management: Daughter manages ?Patient/Family Preliminary Plans: Plans to continue ?Care Coordinator Barriers to Discharge: Lack of/limited family support, Decreased caregiver support, Insurance for SNF coverage ?Care Coordinator Anticipated Follow Up Needs: HH/OP ?Expected length of stay: 10-14 Days ? ?Clinical Impression ?Sw spoke with patient daughter, introduced self, explained role and dicussed question or concerns. SW discussed with daughter the plan is for patient to return home with daughter. Daughter shares that she can share supervision but unsure about 24/7 supervision  due to her work schedule. The patient sleeps and completes ADL on the 2nd floor (13 steps). The daughter is also inquiring about a stair lift recommendation.  SW will follow up with the therapy team on progress. No additional questions or concerns, sw will continue to follow  up/.  ? ?Andria Rhein ?11/27/2021, 1:07 PM ? ?  ?

## 2021-11-27 NOTE — Plan of Care (Signed)
?  Problem: RH Swallowing ?Goal: LTG Patient will consume least restrictive diet using compensatory strategies with assistance (SLP) ?Description: LTG:  Patient will consume least restrictive diet using compensatory strategies with assistance (SLP) ?Flowsheets (Taken 11/27/2021 1710) ?LTG: Pt Patient will consume least restrictive diet using compensatory strategies with assistance of (SLP): Modified Independent ?  ?

## 2021-11-27 NOTE — Evaluation (Signed)
Occupational Therapy Assessment and Plan ? ?Patient Details  ?Name: Ralph Jackson ?MRN: [id number] ?Date of Birth: 06-Aug-1946 ? ?OT Diagnosis: abnormal posture, cognitive deficits, hemiplegia affecting dominant side, and muscle weakness (generalized) ?Rehab Potential: Rehab Potential (ACUTE ONLY): Good ?ELOS: 10 to 12 days  ? ?Today's Date: 11/27/2021 ?OT Individual Time: 4818-5631 ?OT Individual Time Calculation (min): 70 min    ? ?Hospital Problem: Principal Problem: ?  Subcortical infarction [facility]) ? ? ?Past Medical History:  ?Past Medical History:  ?Diagnosis Date  ? Depression   ? ETOH abuse   ? In remission for several years now  ? Hypertension   ? Urinary tract infection   ? ?Past Surgical History:  ?Past Surgical History:  ?Procedure Laterality Date  ? TOTAL HIP ARTHROPLASTY Right 06/21/2021  ? Procedure: TOTAL HIP ARTHROPLASTY ANTERIOR APPROACH;  Surgeon: Samson Frederic, MD;  Location: [facility];  Service: Orthopedics;  Laterality: Right;  ? ? ?Assessment & Plan ?Clinical Impression: Patient is a 76 y.o. year old male with history of anxiety/depression, early dementia, hypothyroidism, hypertension, hyperlipidemia, prior history of alcohol/tobacco abuse in remission and a right total hip arthroplasty 06/21/2021.  Per chart review patient was with daughter and son-in-law .  Two-level home 4 steps to entry.  Daughter does assist with some ADLs.  Patient does not drive.  Presented 11/15/2021 with acute onset of right side weakness.  Admission chemistries hemoglobin 13.3, platelets 311,000, BUN 18, creatinine 0.96, COVID-19 PCR positive, influenza A/B PCR negative.  Cranial CT scan showed small cortical/subcortical left frontal infarct likely acute to subacute.  CT angiogram head and neck showed atherosclerotic disease of both carotid siphon regions but no stenosis greater than 30%.  MRI acute/subacute infarct left frontal lobe cortex and white matter.  No evidence of hemorrhagic conversion.  No mass effect or  midline shift.  MRA of head and neck with no intracranial large vessel occlusion.  Neurology follow-up maintained on aspirin 325 mg daily and Plavix 75 mg daily for 90 days then aspirin 81 mg daily alone.  Lovenox added for DVT prophylaxis.  Patient did complete course of Paxlovid twice daily for COVID-19 infection Airborne isolation discontinued 3/26.  Chest x-ray was negative.  Tolerating a regular consistency diet but has had very poor intake .  Urinalysis 11/25/2021 positive nitrite with urine culture pending placed on Keflex empirically.  Psychiatry service consulted in regards to patient's history of depression and anxiety therapy evaluations completed due to patient's right side weakness was admitted for a comprehensive rehab program. Currently complains of constipation..  Patient transferred to CIR on 11/26/2021 .   ? ?Patient currently requires  min to mod A  with basic self-care skills secondary to muscle weakness, decreased cardiorespiratoy endurance, impaired timing and sequencing, decreased coordination, and decreased motor planning, decreased motor planning, decreased attention, decreased awareness, decreased problem solving, decreased safety awareness, and decreased memory, and decreased standing balance, decreased postural control, hemiplegia, and decreased balance strategies.  Prior to hospitalization, patient could complete BADL with  S to min A per chart review, will need to confirm with daughter  . ? ?Patient will benefit from skilled intervention to decrease level of assist with basic self-care skills and increase independence with basic self-care skills prior to discharge home with care partner.  Anticipate patient will require 24 hour supervision and follow up home health. ? ?OT - End of Session ?Activity Tolerance: Tolerates 10 - 20 min activity with multiple rests ?Endurance Deficit: Yes ?OT Assessment ?Rehab Potential (ACUTE ONLY): Good ?OT Barriers  to Discharge: Decreased caregiver  support;Incontinence;Insurance for SNF coverage ?OT Patient demonstrates impairments in the following area(s): Balance;Cognition;Endurance;Safety;Motor ?OT Basic ADL's Functional Problem(s): Grooming;Bathing;Dressing;Toileting ?OT Transfers Functional Problem(s): Toilet;Tub/Shower ?OT Additional Impairment(s): Fuctional Use of Upper Extremity ?OT Plan ?OT Intensity: Minimum of 1-2 x/day, 45 to 90 minutes ?OT Frequency: 5 out of 7 days ?OT Duration/Estimated Length of Stay: 10 to 12 days ?OT Treatment/Interventions: Balance/vestibular training;Disease mangement/prevention;Neuromuscular re-education;Cognitive remediation/compensation;DME/adaptive equipment instruction;Pain management;UE/LE Strength taining/ROM;Wheelchair propulsion/positioning;Self Care/advanced ADL retraining;Therapeutic Exercise;Community reintegration;Patient/family education;UE/LE Coordination activities;Discharge planning;Functional mobility training;Psychosocial support;Therapeutic Activities ?OT Self Feeding Anticipated Outcome(s): mod I ?OT Basic Self-Care Anticipated Outcome(s): S ?OT Toileting Anticipated Outcome(s): S ?OT Bathroom Transfers Anticipated Outcome(s): S ?OT Recommendation ?Patient destination: Home ?Follow Up Recommendations: Home health OT ?Equipment Recommended: To be determined ? ? ?OT Evaluation ?Precautions/Restrictions  ?Restrictions ?Weight Bearing Restrictions: No ?General ?Chart Reviewed: Yes ?Response to Previous Treatment: Patient with no complaints from previous session ?Family/Caregiver Present: No ? ?Pain ?Pain Assessment ?Pain Scale: 0-10 ?Pain Score: 0-No pain ?Home Living/Prior Functioning ?Home Living ?Family/patient expects to be discharged to:: Private residence ?Living Arrangements: Children ?Available Help at Discharge: Family ?Type of Home: House ?Home Access: Stairs to enter ?Entrance Stairs-Number of Steps: 4 ?Entrance Stairs-Rails: Left, Right ?Home Layout: Two level ?Alternate Level Stairs-Number of  Steps: flight ?Alternate Level Stairs-Rails: Right ?Bathroom Shower/Tub: Tub/shower unit ?Bathroom Toilet: Standard ?Bathroom Accessibility: Yes ?Additional Comments: Pt recently moved in with daughter ? Lives With: Daughter ?IADL History ?Homemaking Responsibilities: No ?Occupation: Retired ?Type of Occupation: janitor ?Leisure and Hobbies: Research scientist (medical) and movies on TV, sitting outside in sun ?Prior Function ?Level of Independence: Needs assistance with ADLs, Independent with transfers, Independent with gait (per chart review) ?Vocation: Retired ?Vision ?Baseline Vision/History: 1 Wears glasses ?Ability to See in Adequate Light: 0 Adequate ?Patient Visual Report: No change from baseline ?Vision Assessment?: No apparent visual deficits ?Perception  ?Perception: Within Functional Limits ?Praxis ?Praxis: Intact ?Cognition ?Cognition ?Overall Cognitive Status: No family/caregiver present to determine baseline cognitive functioning ?Arousal/Alertness: Awake/alert ?Orientation Level: Person;Place;Situation ?Person: Oriented ?Place: Oriented ?Situation: Oriented ?Memory: Impaired ?Memory Impairment: Storage deficit;Decreased recall of new information;Decreased short term memory ?Decreased Short Term Memory: Verbal basic;Functional basic ?Attention: Sustained ?Sustained Attention: Appears intact ?Awareness: Impaired ?Awareness Impairment: Intellectual impairment ?Problem Solving: Impaired ?Problem Solving Impairment: Verbal basic;Functional basic ?Executive Function: Organizing;Self Correcting;Self Monitoring;Reasoning ?Reasoning: Impaired ?Reasoning Impairment: Verbal complex ?Organizing: Impaired ?Organizing Impairment: Verbal basic ?Self Monitoring: Impaired ?Self Monitoring Impairment: Verbal basic ?Self Correcting: Impaired ?Self Correcting Impairment: Verbal basic ?Safety/Judgment: Impaired ?Comments: mild impuslvity with mobility, poor thoroughness of self-care bathing/grooming/hygiene tasks ?Brief  Interview for Mental Status (BIMS) ?Repetition of Three Words (First Attempt): 3 ?Temporal Orientation: Year: Correct ?Temporal Orientation: Month: Accurate within 5 days ?Temporal Orientation: Day: Correct ?Recall: "

## 2021-11-27 NOTE — Progress Notes (Addendum)
?                                                       PROGRESS NOTE ? ? ?Subjective/Complaints: ? ?Labs reviewed  ?Pt with poor appetite,  weak on RIght  ? ?ROS- neg CP, SOB, N/V/D ?Objective: ?  ?No results found. ?Recent Labs  ?  11/25/21 ?1603 11/27/21 ?0604  ?WBC 13.3* 9.5  ?HGB 11.2* 10.6*  ?HCT 33.5* 31.8*  ?PLT 282 271  ? ?Recent Labs  ?  11/25/21 ?1603 11/27/21 ?0604  ?NA 136 140  ?K 4.4 3.9  ?CL 104 105  ?CO2 29 30  ?GLUCOSE 98 84  ?BUN 29* 30*  ?CREATININE 0.91 0.75  ?CALCIUM 8.5* 8.4*  ? ? ?Intake/Output Summary (Last 24 hours) at 11/27/2021 0757 ?Last data filed at 11/27/2021 0459 ?Gross per 24 hour  ?Intake 177 ml  ?Output 475 ml  ?Net -298 ml  ?  ? ?  ? ?Physical Exam: ?Vital Signs ?Blood pressure 109/62, pulse 63, temperature 98.4 ?F (36.9 ?C), temperature source Oral, resp. rate 16, height 5\' 1"  (1.549 m), weight 41.9 kg, SpO2 97 %. ? ? ?General: No acute distress ?Mood and affect are appropriate ?Heart: Regular rate and rhythm no rubs murmurs or extra sounds ?Lungs: Clear to auscultation, breathing unlabored, no rales or wheezes ?Abdomen: Positive bowel sounds, soft nontender to palpation, nondistended ?Extremities: No clubbing, cyanosis, or edema ?Skin: No evidence of breakdown, no evidence of rash ?Neurologic: Cranial nerves II through XII intact, motor strength is 5/5 in left 4+ right  deltoid, bicep, tricep, grip, hip flexor, knee extensors, ankle dorsiflexor and plantar flexor ?Sensory exam normal sensation to light touch and proprioception in bilateral upper and lower extremities ?Cerebellar exam normal finger to nose to finger as well as heel to shin in bilateral upper and lower extremities ?Musculoskeletal: Full range of motion in all 4 extremities. No joint swelling ? ? ?Assessment/Plan: ?1. Functional deficits which require 3+ hours per day of interdisciplinary therapy in a comprehensive inpatient rehab setting. ?Physiatrist is providing close team supervision and 24 hour management of  active medical problems listed below. ?Physiatrist and rehab team continue to assess barriers to discharge/monitor patient progress toward functional and medical goals ? ?Care Tool: ? ?Bathing ?   ?   ?   ?  ?  ?Bathing assist   ?  ?  ?Upper Body Dressing/Undressing ?Upper body dressing   ?What is the patient wearing?: Hospital gown only ?   ?Upper body assist   ?   ?Lower Body Dressing/Undressing ?Lower body dressing ? ? ?   ?What is the patient wearing?: Hospital gown only ? ?  ? ?Lower body assist   ?   ? ?Toileting ?Toileting Toileting Activity did not occur and hygiene only):  (condom cath)  ?Toileting assist   ?  ?  ?Transfers ?Chair/bed transfer ? ?Transfers assist ?   ? ?  ?  ?  ?Locomotion ?Ambulation ? ? ?Ambulation assist ? ?   ? ?  ?  ?   ? ?Walk 10 feet activity ? ? ?Assist ?   ? ?  ?   ? ?Walk 50 feet activity ? ? ?Assist   ? ?  ?   ? ? ?Walk 150 feet activity ? ? ?Assist   ? ?  ?  ?  ? ?  Walk 10 feet on uneven surface  ?activity ? ? ?Assist   ? ? ?  ?   ? ?Wheelchair ? ? ? ? ?Assist   ?  ?  ? ?  ?   ? ? ?Wheelchair 50 feet with 2 turns activity ? ? ? ?Assist ? ?  ?  ? ? ?   ? ?Wheelchair 150 feet activity  ? ? ? ?Assist ?   ? ? ?   ? ?Blood pressure 109/62, pulse 63, temperature 98.4 ?F (36.9 ?C), temperature source Oral, resp. rate 16, height 5\' 1"  (1.549 m), weight 41.9 kg, SpO2 97 %. ? ?Medical Problem List and Plan: ?1. 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            -patient may shower ?            -ELOS/Goals: 5-7 days S, team conf in am  ?            -Admit to CIR ?2.  Antithrombotics: ?-DVT/anticoagulation:  Pharmaceutical: Lovenox ?            -antiplatelet therapy: Aspirin 325 mg daily and Plavix and 5 mg day x90 days ending 01/07/2022 then monotherapy with aspirin 81 mg thereafter ?3. Pain Management: Tylenol as needed ?4. Mood: Lexapro 20 mg daily.  Psychiatry services consulted ?             -antipsychotic agents: N/A ?5. Neuropsych: This patient he is capable of making decisions on his own behalf. ?6. Skin/Wound Care: Routine skin checks ?7. Fluids/Electrolytes/Nutrition: Routine in and out with follow-up chemistries ?8.  Hypothyroidism.  Synthroid ?9.  Hyperlipidemia.  Continue Crestor ?10.  Permissive hypertension.  Resume Lopressor 12.5 mg twice daily ?Vitals:  ? 11/26/21 1938 11/27/21 0455  ?BP: 124/73 109/62  ?Pulse: 73 63  ?Resp: 16 16  ?Temp: 97.9 ?F (36.6 ?C) 98.4 ?F (36.9 ?C)  ?SpO2: 99% 97%  ?Controlled 3/28 ? ?11.  History of alcohol/tobacco abuse.  Now in remission.  Patient will receive counseling ?12.  COVID-19 positive.  Completed course of Paxlovid and airborne precautions discontinued 3/26 ?13.  Suspect UTI.  Urinalysis positive nitrite.  Urine culture pending.  Currently on empiric Keflex. F/u UC.  ?14. Underweight: BMI 17.45: provide dietary education. Pt c/o poor appetite, add Megace  ?  ? ?LOS: ?1 days ?A FACE TO FACE EVALUATION WAS PERFORMED ? ?4/26 Kendrick Remigio ?11/27/2021, 7:57 AM  ? ? ? ?

## 2021-11-27 NOTE — Plan of Care (Signed)
?  Problem: RH Problem Solving ?Goal: LTG Patient will demonstrate problem solving for (SLP) ?Description: LTG:  Patient will demonstrate problem solving for basic/complex daily situations with cues  (SLP) ?Flowsheets (Taken 11/27/2021 1657) ?LTG: Patient will demonstrate problem solving for (SLP): Basic daily situations ?LTG Patient will demonstrate problem solving for: Minimal Assistance - Patient > 75% ?  ?Problem: RH Awareness ?Goal: LTG: Patient will demonstrate awareness during functional activites type of (SLP) ?Description: LTG: Patient will demonstrate awareness during functional activites type of (SLP) ?Flowsheets (Taken 11/27/2021 1657) ?Patient will demonstrate during cognitive/linguistic activities awareness type of: Emergent ?LTG: Patient will demonstrate awareness during cognitive/linguistic activities with assistance of (SLP): Minimal Assistance - Patient > 75% ?  ?

## 2021-11-28 NOTE — Progress Notes (Signed)
?                                                       PROGRESS NOTE ? ? ?Subjective/Complaints: ? ?No issues overnite , pt feels like the appetite stimulant is working  ?ROS- neg CP, SOB, N/V/D ?Objective: ?  ?No results found. ?Recent Labs  ?  11/25/21 ?1603 11/27/21 ?0604  ?WBC 13.3* 9.5  ?HGB 11.2* 10.6*  ?HCT 33.5* 31.8*  ?PLT 282 271  ? ? ?Recent Labs  ?  11/25/21 ?1603 11/27/21 ?0604  ?NA 136 140  ?K 4.4 3.9  ?CL 104 105  ?CO2 29 30  ?GLUCOSE 98 84  ?BUN 29* 30*  ?CREATININE 0.91 0.75  ?CALCIUM 8.5* 8.4*  ? ? ? ?Intake/Output Summary (Last 24 hours) at 11/28/2021 0827 ?Last data filed at 11/28/2021 0805 ?Gross per 24 hour  ?Intake 720 ml  ?Output --  ?Net 720 ml  ? ?  ? ?  ? ?Physical Exam: ?Vital Signs ?Blood pressure 109/63, pulse 75, temperature 98.6 ?F (37 ?C), temperature source Oral, resp. rate 16, height '5\' 1"'  (1.549 m), weight 41.9 kg, SpO2 99 %. ? ? ?General: No acute distress ?Mood and affect are appropriate ?Heart: Regular rate and rhythm no rubs murmurs or extra sounds ?Lungs: Clear to auscultation, breathing unlabored, no rales or wheezes ?Abdomen: Positive bowel sounds, soft nontender to palpation, nondistended ?Extremities: No clubbing, cyanosis, or edema ?Skin: No evidence of breakdown, no evidence of rash ?Neurologic: Cranial nerves II through XII intact, motor strength is 5/5 in left 4+ right  deltoid, bicep, tricep, grip, hip flexor, knee extensors, ankle dorsiflexor and plantar flexor ?Sensory exam normal sensation to light touch and proprioception in bilateral upper and lower extremities ?Cerebellar exam normal finger to nose to finger as well as heel to shin in bilateral upper and lower extremities ?Musculoskeletal: Full range of motion in all 4 extremities. No joint swelling ? ? ?Assessment/Plan: ?1. Functional deficits which require 3+ hours per day of interdisciplinary therapy in a comprehensive inpatient rehab setting. ?Physiatrist is providing close team supervision and 24 hour  management of active medical problems listed below. ?Physiatrist and rehab team continue to assess barriers to discharge/monitor patient progress toward functional and medical goals ? ?Care Tool: ? ?Bathing ?   ?Body parts bathed by patient: Right arm, Left arm, Chest, Face  ?   ?  ?  ?Bathing assist Assist Level: Supervision/Verbal cueing ?  ?  ?Upper Body Dressing/Undressing ?Upper body dressing   ?What is the patient wearing?: Pull over shirt ?   ?Upper body assist Assist Level: Supervision/Verbal cueing ?   ?Lower Body Dressing/Undressing ?Lower body dressing ? ? ?   ?What is the patient wearing?: Incontinence brief, Pants ? ?  ? ?Lower body assist Assist for lower body dressing: Moderate Assistance - Patient 50 - 74% ?   ? ?Toileting ?Toileting Toileting Activity did not occur Landscape architect and hygiene only):  (condom cath)  ?Toileting assist Assist for toileting: Moderate Assistance - Patient 50 - 74% ?  ?  ?Transfers ?Chair/bed transfer ? ?Transfers assist ?   ? ?Chair/bed transfer assist level: Minimal Assistance - Patient > 75% ?  ?  ?Locomotion ?Ambulation ? ? ?Ambulation assist ? ?   ? ?Assist level: Minimal Assistance - Patient > 75% ?Assistive device: Walker-rolling ?Max  distance: 150 ft  ? ?Walk 10 feet activity ? ? ?Assist ?   ? ?Assist level: Minimal Assistance - Patient > 75% ?Assistive device: Walker-rolling  ? ?Walk 50 feet activity ? ? ?Assist   ? ?Assist level: Minimal Assistance - Patient > 75% ?Assistive device: Walker-rolling  ? ? ?Walk 150 feet activity ? ? ?Assist   ? ?Assist level: Minimal Assistance - Patient > 75% ?Assistive device: Walker-rolling ?  ? ?Walk 10 feet on uneven surface  ?activity ? ? ?Assist Walk 10 feet on uneven surfaces activity did not occur: Safety/medical concerns ? ? ?  ?   ? ?Wheelchair ? ? ? ? ?Assist Is the patient using a wheelchair?: No ?Type of Wheelchair: Manual ?Wheelchair activity did not occur: N/A ? ?  ?   ? ? ?Wheelchair 50 feet with 2 turns  activity ? ? ? ?Assist ? ?  ?Wheelchair 50 feet with 2 turns activity did not occur: N/A ? ? ?   ? ?Wheelchair 150 feet activity  ? ? ? ?Assist ? Wheelchair 150 feet activity did not occur: N/A ? ? ?   ? ?Blood pressure 109/63, pulse 75, temperature 98.6 ?F (37 ?C), temperature source Oral, resp. rate 16, height '5\' 1"'  (1.549 m), weight 41.9 kg, SpO2 99 %. ? ?Medical Problem List and Plan: ?1. 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            -patient may shower ?            -ELOS/Goals: 5-7 days S, Team conference today please see physician documentation under team conference tab, met with team  to discuss problems,progress, and goals. Formulized individual treatment plan based on medical history, underlying problem and comorbidities. ? ? ?2.  Antithrombotics: ?-DVT/anticoagulation:  Pharmaceutical: Lovenox ?            -antiplatelet therapy: Aspirin 325 mg daily and Plavix and 5 mg day x90 days ending 01/07/2022 then monotherapy with aspirin 81 mg thereafter ?3. Pain Management: Tylenol as needed ?4. Mood: Lexapro 20 mg daily.  Psychiatry services consulted ?            -antipsychotic agents: N/A ?5. Neuropsych: This patient he is capable of making decisions on his own behalf. ?6. Skin/Wound Care: Routine skin checks ?7. Fluids/Electrolytes/Nutrition: Routine in and out with follow-up chemistries ?8.  Hypothyroidism.  Synthroid ?9.  Hyperlipidemia.  Continue Crestor ?10.  Permissive hypertension.  Resume Lopressor 12.5 mg twice daily ?Vitals:  ? 11/27/21 1347 11/27/21 2017  ?BP: 99/63 109/63  ?Pulse: 73 75  ?Resp: 16   ?Temp: 98.6 ?F (37 ?C)   ?SpO2: 99%   ?Controlled 3/29 , actually on low side  ? ?11.  History of alcohol/tobacco abuse.  Now in remission.  Patient will receive counseling ?12.  COVID-19 positive.  Completed course of Paxlovid and airborne precautions discontinued 3/26 ?13. ? UTI.  Urinalysis positive nitrite.     Currently on empiric Keflex. COntiminated specimen UC, UA was + will complete 5 d Keflex, afebrile .  ?14. Underweight: BMI 17.45: provide dietary education. Pt c/o poor appetite, add Megace now taking 80-100% of meals  ?  ? ?LOS: ?2 days ?A FACE TO FACE EVALUATION WAS PERFORMED ? ?Luanna Salk Elson Ulbrich ?11/28/2021, 8:27 AM  ? ? ? ?

## 2021-11-28 NOTE — Progress Notes (Signed)
Physical Therapy Session Note ? ?Patient Details  ?Name: Ralph Jackson ?MRN: [id number] ?Date of Birth: 03-14-1946 ? ?Today's Date: 11/28/2021 ?PT Individual Time: 1415-1440 ?PT Individual Time Calculation (min): 25 min  ? ?Short Term Goals: ?Week 1:  PT Short Term Goal 1 (Week 1): Pt will perform bed mobility with consistent supervision and no use of bed features. ?PT Short Term Goal 2 (Week 1): Pt will perform all functional standing transfers with supervision and safe technique. ?PT Short Term Goal 3 (Week 1): Pt will ambulate with improved R trendelenberg using RW with close supervision. ?PT Short Term Goal 4 (Week 1): Pt will complete at least 4 steps with RHR only with consistent MinA. ? ?Skilled Therapeutic Interventions/Progress Updates:  ?Patient received sitting up in recliner, agreeable to limited PT with encouragement. He denies pain, but endorses fatigue having just finished with OT shortly before this PT arrived. He requested to complete therapy in his room. The following therex was completed: chest press, shoulder press, bicep curl, LAQ, marches, hip abd/add. Patient remaining up in recliner, seatbelt alarm on, call light within reach.  ? ?Therapy Documentation ?Precautions:  ?Precautions ?Precautions: Fall ?Precaution Comments: R hemi, Bil hip weakness, HOH ?Restrictions ?Weight Bearing Restrictions: No ? ? ?Therapy/Group: Individual Therapy ? ?Westley Foots ?Westley Foots, PT, DPT, CBIS ? ?11/28/2021, 7:47 AM  ?

## 2021-11-28 NOTE — Plan of Care (Signed)
?  Problem: RH Balance ?Goal: LTG Patient will maintain dynamic standing with ADLs (OT) ?Description: LTG:  Patient will maintain dynamic standing balance with assist during activities of daily living (OT)  ?Flowsheets (Taken 11/28/2021 1213) ?LTG: Pt will maintain dynamic standing balance during ADLs with: Supervision/Verbal cueing ?  ?Problem: Sit to Stand ?Goal: LTG:  Patient will perform sit to stand in prep for activites of daily living with assistance level (OT) ?Description: LTG:  Patient will perform sit to stand in prep for activites of daily living with assistance level (OT) ?Flowsheets (Taken 11/28/2021 1213) ?LTG: PT will perform sit to stand in prep for activites of daily living with assistance level: Supervision/Verbal cueing ?  ?Problem: RH Grooming ?Goal: LTG Patient will perform grooming w/assist,cues/equip (OT) ?Description: LTG: Patient will perform grooming with assist, with/without cues using equipment (OT) ?Flowsheets (Taken 11/28/2021 1213) ?LTG: Pt will perform grooming with assistance level of: Supervision/Verbal cueing ?  ?Problem: RH Bathing ?Goal: LTG Patient will bathe all body parts with assist levels (OT) ?Description: LTG: Patient will bathe all body parts with assist levels (OT) ?Flowsheets (Taken 11/28/2021 1213) ?LTG: Pt will perform bathing with assistance level/cueing: Supervision/Verbal cueing ?  ?Problem: RH Dressing ?Goal: LTG Patient will perform upper body dressing (OT) ?Description: LTG Patient will perform upper body dressing with assist, with/without cues (OT). ?Flowsheets (Taken 11/28/2021 1213) ?LTG: Pt will perform upper body dressing with assistance level of: Independent with assistive device ?Goal: LTG Patient will perform lower body dressing w/assist (OT) ?Description: LTG: Patient will perform lower body dressing with assist, with/without cues in positioning using equipment (OT) ?Flowsheets (Taken 11/28/2021 1213) ?LTG: Pt will perform lower body dressing with assistance  level of: Supervision/Verbal cueing ?  ?Problem: RH Toileting ?Goal: LTG Patient will perform toileting task (3/3 steps) with assistance level (OT) ?Description: LTG: Patient will perform toileting task (3/3 steps) with assistance level (OT)  ?Flowsheets (Taken 11/28/2021 1213) ?LTG: Pt will perform toileting task (3/3 steps) with assistance level: Supervision/Verbal cueing ?  ?Problem: RH Functional Use of Upper Extremity ?Goal: LTG Patient will use RT/LT upper extremity as a (OT) ?Description: LTG: Patient will use right/left upper extremity as a stabilizer/gross assist/diminished/nondominant/dominant level with assist, with/without cues during functional activity (OT) ?Flowsheets (Taken 11/28/2021 1213) ?LTG: Use of upper extremity in functional activities: RUE as dominant level ?LTG: Pt will use upper extremity in functional activity with assistance level of: Supervision/Verbal cueing ?  ?Problem: RH Toilet Transfers ?Goal: LTG Patient will perform toilet transfers w/assist (OT) ?Description: LTG: Patient will perform toilet transfers with assist, with/without cues using equipment (OT) ?Flowsheets (Taken 11/28/2021 1213) ?LTG: Pt will perform toilet transfers with assistance level of: Supervision/Verbal cueing ?  ?Problem: RH Tub/Shower Transfers ?Goal: LTG Patient will perform tub/shower transfers w/assist (OT) ?Description: LTG: Patient will perform tub/shower transfers with assist, with/without cues using equipment (OT) ?Flowsheets (Taken 11/28/2021 1213) ?LTG: Pt will perform tub/shower stall transfers with assistance level of: Supervision/Verbal cueing ?  ?

## 2021-11-28 NOTE — Evaluation (Signed)
Physical Therapy Assessment and Plan ? ?Patient Details  ?Name: Ralph Jackson ?MRN: [id number] ?Date of Birth: [birth year] ? ?PT Diagnosis: Difficulty walking, Hemiparesis dominant, and Muscle weakness ?Rehab Potential: Good ?ELOS: 10-12 days  ? ?Today's Date: 11/27/2021 ?PT Individual Time: 4818-5631  ?PT Individual Time Calculation (min): 65 min    ? ?Hospital Problem: Principal Problem: ?  Subcortical infarction [facility]) ? ? ?Past Medical History:  ?Past Medical History:  ?Diagnosis Date  ? Depression   ? ETOH abuse   ? In remission for several years now  ? Hypertension   ? Urinary tract infection   ? ?Past Surgical History:  ?Past Surgical History:  ?Procedure Laterality Date  ? TOTAL HIP ARTHROPLASTY Right 06/21/2021  ? Procedure: TOTAL HIP ARTHROPLASTY ANTERIOR APPROACH;  Surgeon: Samson Frederic, MD;  Location: [facility] ORS;  Service: Orthopedics;  Laterality: Right;  ? ? ?Assessment & Plan ?Clinical Impression: Patient is a 76 y.o. right-handed male with history of anxiety/depression, early dementia, hypothyroidism, hypertension, hyperlipidemia, prior history of alcohol/tobacco abuse in remission and a right total hip arthroplasty 06/21/2021.  Per chart review patient was with daughter and son-in-law .  Two-level home 4 steps to entry.  Daughter does assist with some ADLs.  Patient does not drive.  Presented 11/15/2021 with acute onset of right side weakness.  Admission chemistries hemoglobin 13.3, platelets 311,000, BUN 18, creatinine 0.96, COVID-19 PCR positive, influenza A/B PCR negative.  Cranial CT scan showed small cortical/subcortical left frontal infarct likely acute to subacute.  CT angiogram head and neck showed atherosclerotic disease of both carotid siphon regions but no stenosis greater than 30%.  MRI acute/subacute infarct left frontal lobe cortex and white matter.  No evidence of hemorrhagic conversion.  No mass effect or midline shift.  MRA of head and neck with no intracranial large vessel  occlusion.  Neurology follow-up maintained on aspirin 325 mg daily and Plavix 75 mg daily for 90 days then aspirin 81 mg daily alone.  Lovenox added for DVT prophylaxis.  Patient did complete course of Paxlovid twice daily for COVID-19 infection Airborne isolation discontinued 3/26.  Chest x-ray was negative.  Tolerating a regular consistency diet but has had very poor intake .  Urinalysis 11/25/2021 positive nitrite with urine culture pending placed on Keflex empirically.  Psychiatry service consulted in regards to patient's history of depression and anxiety therapy evaluations completed due to patient's right side weakness was admitted for a comprehensive rehab program. Currently complains of constipation.  Patient transferred to CIR on 11/26/2021 .  ? ?Patient currently requires min assist with mobility secondary to muscle weakness, decreased cardiorespiratoy endurance, unbalanced muscle activation and decreased coordination, ,, decreased attention to right, decreased awareness and decreased safety awareness, and decreased standing balance, decreased balance strategies, and hemipareisis .  Prior to hospitalization, patient was modified independent  with mobility and lived with Daughter in a [address].  Home access is 4Stairs to enter with R HR to ascend. ? ?Patient will benefit from skilled PT intervention to maximize safe functional mobility, minimize fall risk, and decrease caregiver burden for planned discharge home with 24 hour supervision.  Anticipate patient will benefit from follow up OP dependent on progress at time of discharge. ? ?PT - End of Session ?Activity Tolerance: Tolerates 30+ min activity with multiple rests ?Endurance Deficit: Yes ?PT Assessment ?Rehab Potential (ACUTE/IP ONLY): Good ?PT Barriers to Discharge: Inaccessible home environment;Decreased caregiver support;Home environment access/layout;Lack of/limited family support;Insurance for SNF coverage;Weight;Medication compliance;Nutrition  means ?PT Plan ?PT Intensity: Minimum of 1-2  x/day ,45 to 90 minutes ?PT Frequency: 5 out of 7 days ?PT Duration Estimated Length of Stay: 10-12 days ?PT Treatment/Interventions: Ambulation/gait training;Discharge planning;Functional mobility training;Psychosocial support;Therapeutic Activities;Visual/perceptual remediation/compensation;Wheelchair propulsion/positioning;Therapeutic Exercise;Skin care/wound management;Neuromuscular re-education;Balance/vestibular training;Disease management/prevention;Cognitive remediation/compensation;DME/adaptive equipment instruction;Pain management;Splinting/orthotics;UE/LE Strength taining/ROM;UE/LE Coordination activities;Stair training;Patient/family education;Functional electrical stimulation;Community reintegration ?PT Recommendation ?Recommendations for Other Services: Therapeutic Recreation consult ?Therapeutic Recreation Interventions: Stress management;Outing/community reintergration;Kitchen group ?Follow Up Recommendations: Home health PT;Outpatient PT ?Patient destination: Home ?Equipment Recommended: To be determined ? ? ?PT Evaluation ?Precautions/Restrictions ?Precautions ?Precautions: Fall ?Precaution Comments: R hemi, Bil hip weakness, HOH ?Restrictions ?Weight Bearing Restrictions: No ?General ?  Vital Signs  ?Pain ?Pain Assessment ?Pain Scale: 0-10 ?Pain Score: 0-No pain ?Pain Interference ?Pain Interference ?Pain Effect on Sleep: 1. Rarely or not at all ?Pain Interference with Therapy Activities: 1. Rarely or not at all ?Pain Interference with Day-to-Day Activities: 1. Rarely or not at all ?Home Living/Prior Functioning ?Home Living ?Available Help at Discharge: Family ?Type of Home: House ?Home Access: Stairs to enter ?Entrance Stairs-Number of Steps: 4 ?Entrance Stairs-Rails: Left;Right ?Home Layout: Two level ?Alternate Level Stairs-Number of Steps: flight ?Alternate Level Stairs-Rails: Right ?Bathroom Shower/Tub: Tub/shower unit ?Bathroom Toilet:  Standard ?Bathroom Accessibility: Yes ?Additional Comments: Pt recently moved in with daughter ? Lives With: Daughter ?Prior Function ?Level of Independence: Needs assistance with ADLs;Independent with transfers;Independent with gait (per chart review) ? Able to Take Stairs?: Yes ?Driving: No ?Vocation: Retired ?Vision/Perception  ?Vision - History ?Ability to See in Adequate Light: 0 Adequate ?Perception ?Perception: Within Functional Limits ?Praxis ?Praxis: Intact  ?Cognition ?Overall Cognitive Status: Within Functional Limits for tasks assessed ?Arousal/Alertness: Awake/alert ?Orientation Level: Oriented X4 ?Memory: Impaired ?Memory Impairment: Decreased short term memory ?Awareness: Impaired ?Executive Function: Self Monitoring ?Self Monitoring: Impaired ?Safety/Judgment: Impaired ?Sensation ?Sensation ?Light Touch: Appears Intact ?Coordination ?Gross Motor Movements are Fluid and Coordinated: No ?Fine Motor Movements are Fluid and Coordinated: Yes ?Coordination and Movement Description: Significant trendelenburg , mild R HP ?Finger Nose Finger Test: slower on R but WNL ?Heel Shin Test: RLE demoship weakness with reduced control, LLE WFL but slow d/t generalized weakness ?Motor  ?Motor ?Motor: Other (comment) (R hemipareisis) ?Motor - Skilled Clinical Observations: R hemipareisis, Significant trendelenburg  ? ?Trunk/Postural Assessment  ?Cervical Assessment ?Cervical Assessment: Exceptions to [facility] (forward head) ?Thoracic Assessment ?Thoracic Assessment: Exceptions to [facility] (rounded shoulders) ?Lumbar Assessment ?Lumbar Assessment: Exceptions to [facility] (posterior pelvic tilt) ?Postural Control ?Postural Control: Within Functional Limits  ?Balance ?Balance ?Balance Assessed: Yes ?Static Sitting Balance ?Static Sitting - Balance Support: Feet supported;No upper extremity supported ?Static Sitting - Level of Assistance: 5: Stand by assistance ?Dynamic Sitting Balance ?Dynamic Sitting - Balance Support: Feet supported;No  upper extremity supported ?Dynamic Sitting - Level of Assistance: 5: Stand by assistance;4: Min assist (CGA) ?Static Standing Balance ?Static Standing - Balance Support: During functional activity;Bilateral upper extremity s

## 2021-11-28 NOTE — Plan of Care (Signed)
?  Problem: RH Balance ?Goal: LTG Patient will maintain dynamic standing balance (PT) ?Description: LTG:  Patient will maintain dynamic standing balance with assistance during mobility activities (PT) ?Flowsheets (Taken 11/27/2021 1715) ?LTG: Pt will maintain dynamic standing balance during mobility activities with:: Supervision/Verbal cueing ?  ?Problem: Sit to Stand ?Goal: LTG:  Patient will perform sit to stand with assistance level (PT) ?Description: LTG:  Patient will perform sit to stand with assistance level (PT) ?Flowsheets (Taken 11/27/2021 1715) ?LTG: PT will perform sit to stand in preparation for functional mobility with assistance level: Independent with assistive device ?  ?Problem: RH Bed Mobility ?Goal: LTG Patient will perform bed mobility with assist (PT) ?Description: LTG: Patient will perform bed mobility with assistance, with/without cues (PT). ?Flowsheets (Taken 11/27/2021 1715) ?LTG: Pt will perform bed mobility with assistance level of: Set up assist  ?  ?Problem: RH Bed to Chair Transfers ?Goal: LTG Patient will perform bed/chair transfers w/assist (PT) ?Description: LTG: Patient will perform bed to chair transfers with assistance (PT). ?Flowsheets (Taken 11/27/2021 1715) ?LTG: Pt will perform Bed to Chair Transfers with assistance level: Supervision/Verbal cueing ?  ?Problem: RH Car Transfers ?Goal: LTG Patient will perform car transfers with assist (PT) ?Description: LTG: Patient will perform car transfers with assistance (PT). ?Flowsheets (Taken 11/27/2021 1715) ?LTG: Pt will perform car transfers with assist:: Contact Guard/Touching assist ?  ?Problem: RH Furniture Transfers ?Goal: LTG Patient will perform furniture transfers w/assist (OT/PT) ?Description: LTG: Patient will perform furniture transfers  with assistance (OT/PT). ?Flowsheets (Taken 11/27/2021 1715) ?LTG: Pt will perform furniture transfers with assist:: Supervision/Verbal cueing ?  ?Problem: RH Ambulation ?Goal: LTG Patient will  ambulate in controlled environment (PT) ?Description: LTG: Patient will ambulate in a controlled environment, # of feet with assistance (PT). ?Flowsheets (Taken 11/27/2021 1715) ?LTG: Pt will ambulate in controlled environ  assist needed:: Supervision/Verbal cueing ?LTG: Ambulation distance in controlled environment: 261ft using LRAD ?Goal: LTG Patient will ambulate in home environment (PT) ?Description: LTG: Patient will ambulate in home environment, # of feet with assistance (PT). ?Flowsheets (Taken 11/27/2021 1715) ?LTG: Pt will ambulate in home environ  assist needed:: Supervision/Verbal cueing ?LTG: Ambulation distance in home environment: at least 50 ft using LRAD ?  ?Problem: RH Stairs ?Goal: LTG Patient will ambulate up and down stairs w/assist (PT) ?Description: LTG: Patient will ambulate up and down # of stairs with assistance (PT) ?Flowsheets (Taken 11/27/2021 1715) ?LTG: Pt will ambulate up/down stairs assist needed:: Contact Guard/Touching assist ?  ?

## 2021-11-28 NOTE — Plan of Care (Signed)
?  Problem: RH Problem Solving ?Goal: LTG Patient will demonstrate problem solving for (SLP) ?Description: LTG:  Patient will demonstrate problem solving for basic/complex daily situations with cues  (SLP) ?11/28/2021 1631 by Sonny Masters T, CCC-SLP ?Outcome: Not Applicable ?Note: Goal discontinued following discussion with daughter who reported patient is at baseline from a cognitive standpoint. ?  ?Problem: RH Awareness ?Goal: LTG: Patient will demonstrate awareness during functional activites type of (SLP) ?Description: LTG: Patient will demonstrate awareness during functional activites type of (SLP) ?11/28/2021 1631 by Sonny Masters T, CCC-SLP ?Outcome: Not Applicable ?Note: Goal discontinued following discussion with daughter who reported patient is at baseline from a cognitive standpoint. ?  ?

## 2021-11-28 NOTE — Evaluation (Signed)
Recreational Therapy Assessment and Plan ? ?Patient Details  ?Name: Ralph Jackson ?MRN: [id number] ?Date of Birth: 1945/09/28 ?Today's Date: 11/28/2021 ? ?Rehab Potential:  Fair ?ELOS:   d/c 4/7 ? ?Assessment ?Hospital Problem: Principal Problem: ?  Subcortical infarction [facility]) ?  ?  ?Past Medical History:  ?    ?Past Medical History:  ?Diagnosis Date  ? Depression    ? ETOH abuse    ?  In remission for several years now  ? Hypertension    ? Urinary tract infection    ?  ?Past Surgical History:  ?     ?Past Surgical History:  ?Procedure Laterality Date  ? TOTAL HIP ARTHROPLASTY Right 06/21/2021  ?  Procedure: TOTAL HIP ARTHROPLASTY ANTERIOR APPROACH;  Surgeon: Rod Can, MD;  Location: [facility];  Service: Orthopedics;  Laterality: Right;  ?  ?  ?Assessment & Plan ?Clinical Impression: Patient is a 76 y.o. year old male with history of anxiety/depression, early dementia, hypothyroidism, hypertension, hyperlipidemia, prior history of alcohol/tobacco abuse in remission and a right total hip arthroplasty 06/21/2021.  Per chart review patient was with daughter and son-in-law .  Two-level home 4 steps to entry.  Daughter does assist with some ADLs.  Patient does not drive.  Presented 11/15/2021 with acute onset of right side weakness.  Admission chemistries hemoglobin 13.3, platelets 311,000, BUN 18, creatinine 0.96, COVID-19 PCR positive, influenza A/B PCR negative.  Cranial CT scan showed small cortical/subcortical left frontal infarct likely acute to subacute.  CT angiogram head and neck showed atherosclerotic disease of both carotid siphon regions but no stenosis greater than 30%.  MRI acute/subacute infarct left frontal lobe cortex and white matter.  No evidence of hemorrhagic conversion.  No mass effect or midline shift.  MRA of head and neck with no intracranial large vessel occlusion.  Neurology follow-up maintained on aspirin 325 mg daily and Plavix 75 mg daily for 90 days then aspirin 81 mg daily alone.   Lovenox added for DVT prophylaxis.  Patient did complete course of Paxlovid twice daily for COVID-19 infection Airborne isolation discontinued 3/26.  Chest x-ray was negative.  Tolerating a regular consistency diet but has had very poor intake .  Urinalysis 11/25/2021 positive nitrite with urine culture pending placed on Keflex empirically.  Psychiatry service consulted in regards to patient's history of depression and anxiety therapy evaluations completed due to patient's right side weakness was admitted for a comprehensive rehab program. Currently complains of constipation..  Patient transferred to CIR on 11/26/2021 .   ? ?Met with pt today to discuss TR services including leisure education, activity analysis/modifications and stress management.  Also discussed the importance of social, emotional, spiritual health in addition to physical health and their effects on overall health and wellness.  Pt stated understanding.  Pt with little interest in TR services.  Will continue to monitor ? ? ?Plan ?  ?No further TR as pt states little interest in TR services ?Recommendations for other services: None  ? ?Discharge Criteria: Patient will be discharged from TR if patient refuses treatment 3 consecutive times without medical reason.  If treatment goals not met, if there is a change in medical status, if patient makes no progress towards goals or if patient is discharged from hospital. ? ?The above assessment, treatment plan, treatment alternatives and goals were discussed and mutually agreed upon: by patient ? ?Freada Twersky ?11/28/2021, 3:47 PM  ?

## 2021-11-28 NOTE — Progress Notes (Signed)
Physical Therapy Session Note ? ?Patient Details  ?Name: Ralph Jackson ?MRN: [id number] ?Date of Birth: 03/25/1946 ? ?Today's Date: 11/28/2021 ?PT Individual Time: 7829-5621 ?PT Individual Time Calculation (min): 68 min  ? ?Short Term Goals: ?Week 1:  PT Short Term Goal 1 (Week 1): Pt will perform bed mobility with consistent supervision and no use of bed features. ?PT Short Term Goal 2 (Week 1): Pt will perform all functional standing transfers with supervision and safe technique. ?PT Short Term Goal 3 (Week 1): Pt will ambulate with improved R trendelenberg using RW with close supervision. ?PT Short Term Goal 4 (Week 1): Pt will complete at least 4 steps with RHR only with consistent MinA. ? ? ?Skilled Therapeutic Interventions/Progress Updates:  ?Pt received supine in bed and agreeable to PT. Supine>sit transfer with supervision assist and cues for use of bed features for trunk control on the R.  ? ?Donning pants EOB with min assist for management of the RLE.  ? ?Sit<>stand in parallel bars with supervision assist and stand pivot transfer performed with RW and CGA throughout session with cues for improved step height on the RLE.  ? ?Pregait forward and lateral stepping x 8 bil with BUE support on parallel bars. Min assist from PT with cues for improved posture, and activation of glute med in standing to improve pelvic stability.  ? ?Forward/reverse and side stepping gait in parallel bars 2 x 91f each. Stepping up/down 4 inch step x 6 BLE. Cues for improved posture and activation of hip stabilizer to reduce R sided trendelenburg.  ? ?Seated NMR:  ?Hip abduction 2 x 10. Hip flexion 2 x 10, knee extension 2 x 10 cues for improved ROM on the RLE>LLE, decreased speed of eccentric movement, and decreased compensations through trunk.  ? ?NUstep reciprocal movement training x 8 with cues for improved hip ER/abduction throughout on the R side.  ? ?[facility]  and all needs met.   ? ? ? ? ? ?Therapy Documentation ?Precautions:  ?Precautions ?Precautions: Fall ?Precaution Comments: R hemi, Bil hip weakness, HOH ?Restrictions ?Weight Bearing Restrictions: No ? ?  ?Pain: ?Pain Assessment ?Pain Scale: 0-10 ?Pain Score: 0-No pain ? ? ?Therapy/Group: Individual Therapy ? ?ALorie Phenix?11/28/2021, 9:17 AM  ?

## 2021-11-28 NOTE — Patient Care Conference (Signed)
Inpatient RehabilitationTeam Conference and Plan of Care Update ?Date: 11/28/2021   Time: 10:21 AM  ? ? ?Patient Name: Ralph Jackson      ?Medical Record Number: [id number]  ?Date of Birth: 11-24-1945 ?Sex: Male         ?Room/Bed: 5C16C/5C16C-01 ?Payor Info: Payor: [employer] / Plan: [facility] MEDICARE / Product Type: *No Product type* /   ? ?Admit Date/Time:  11/26/2021  2:52 PM ? 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 ?Hospital Problems: Principal Problem: ?  Subcortical infarction [facility]) ? ? ? ?Expected Discharge Date: Expected Discharge Date: 12/07/21 ? ?Team Members Present: ?Physician leading conference: Dr. Alysia Penna ?Social Worker Present: Erlene Quan, BSW ?Nurse Present: Dorien Chihuahua, RN ?PT Present: Barrie Folk, PT ?OT Present: Providence Lanius, OT ?SLP Present: Sherren Kerns, SLP ?PPS Coordinator present : Ileana Ladd, PT ? ?   Current Status/Progress Goal Weekly Team Focus  ?Bowel/Bladder ? ?   Continent of bladder, incontinent of loose bowel movements post constipation   Continent of bowel and bladder   Monitor need for laxatives, toileting  ?Swallow/Nutrition/ Hydration ? ? Dys 3 diet/thin liquids, intermittent sup A  mod I  tolerance of current diet   ?ADL's ? ? S UB ADL, mod LBD and toileting, min ambulatory toilet transfers with youth RW; mild RUE weakness  S overall  RUE NMR, transfer/balance/self-care retraining, pt/family AE/DME education, activity tolerance   ?Mobility ? ? Bed Mobility = CGA/ MinA, Transfers = CGA/ MinA, ambulation = CGA/ intermittent MinA for balance and RLE clearance, stairs = ModA  overall supervision  R NMR, R hip/ knee strengthening, standing balance, improved quality of gait, safety awareness, R attention, family education   ?Communication ? ?           ?Safety/Cognition/ Behavioral Observations ? mod A  min A  problem solving, safety and error awareness   ?Pain ? ?   N/A        ?Skin ? ?   N/A        ? ? ?Discharge Planning:   ?PAtient discharging back home with daughter. Able to manage medications, daughter concered about providing 24/7 supervision   ?Team Discussion: ?Patient with history of chronic ETOH abuse and malnourishment. Hip pain addressed; xray showed no loosening of THA post fall w stroke but he does have right side weakness and posterior leaning.  ?Patient on target to meet rehab goals: ?yes, currently needs supervision for upper body care and mod assist for lower body care and toileting.  Abluates with a RW and min assit with mild right upper extremity weakness.  ? ?*See Care Plan and progress notes for long and short-term goals.  ? ?Revisions to Treatment Plan:  ?Daughter reports forgetful at baseline with recent COVID with poor error awareness and mild cognitive deficits with swallowing issues at times; leave on D3 diet for now. ?  ?Teaching Needs: ?Safety, medication management, transfers, toileting, etc. ?  ?Current Barriers to Discharge: ?Decreased caregiver support and Home enviroment access/layout ? ?Possible Resolutions to Barriers: ?Family education ?  ? ? Medical Summary ?Current Status: Appetite chronically poor, malnourished , hx of ETOH ? Barriers to Discharge: Medical stability;Weight;Other (comments) ? Barriers to Discharge Comments: hx ETOH with chronic malnourishment ?Possible Resolutions to [employer]: appetite stim trial , cont with muscle strengthening ? ? ?Continued Need for Acute Rehabilitation Level of Care: The patient requires daily medical management by a physician with specialized training in physical medicine and rehabilitation for the following reasons: ?  Direction of a multidisciplinary physical rehabilitation program to maximize functional independence : Yes ?Medical management of patient stability for increased activity during participation in an intensive rehabilitation regime.: Yes ?Analysis of laboratory values and/or radiology reports with any subsequent need for medication  adjustment and/or medical intervention. : Yes ? ? ?I attest that I was present, lead the team conference, and concur with the assessment and plan of the team. ? ? ?Dorien Chihuahua B ?11/28/2021, 4:21 PM  ? ? ? ? ? ? ?

## 2021-11-28 NOTE — Progress Notes (Signed)
Occupational Therapy Session Note ? ?Patient Details  ?Name: Ralph Jackson ?MRN: [id number] ?Date of Birth: 1946-04-11 ? ?Today's Date: 11/28/2021 ?OT Individual Time: 9532-0233 ?OT Individual Time Calculation (min): 32 min + 56 min ? ? ?Short Term Goals: ?Week 1:  OT Short Term Goal 1 (Week 1): STG = LTG 2/2 ELOS ? ?Skilled Therapeutic Interventions/Progress Updates:  ?  Session 1 [phone]): Pt received seated in w/c at sink plugging in his cellphone, no c/o, agreeable to therapy. Session focus on activity tolerance, transfer retraining, RUE NMR in prep for improved ADL/IADL/func mobility performance + decreased caregiver burden. Total A w/c transport to and from gym. ? ?Stood to play 3 rounds of corn hole with unilateral UE support on RW and able to toss bags with R hand, only able to make one hole but CGA for balance throughout. Pt very motivated to work on his RUE, reports using grip strengthening sponge frequently. ? ?Pet therapy team present to visit and pt appreciative.  ? ?Short ambulatory transfer with RW and CGA due to R hip weakness > recliner. ? ? ?Pt left seated in recliner with safety belt alarm engaged, call bell in reach, and all immediate needs met.  ? ? ?Session 2 [phone]): Pt received seated in w/c with daughter present, no c/o pain, agreeable to therapy. Session focus on self-care retraining, activity tolerance, transfer retraining, RUE NMR in prep for improved ADL/IADL/func mobility performance + decreased caregiver burden. Ambulated > TTB CGA, cues for sequencing and safe TTB transfer. Bathed full-body at sink to stand level with min A to bathe buttocks and for standing balance with BUE support on grab bars. ? ?Donned shirt with S and increased time, min A to pull up pants on his R hips. Old brief saturated with urine and pt with poor awareness. ? ?Donned socks with mod A to thread R toes. ? ?Completed 1x10 of the following with yellow theraband: forward and cross body punches,  shoulder rows, biceps curls, and B horizontal shoulder abduction. ? ?Pt left seated in recliner with safety belt alarm engaged, call bell in reach, and all immediate needs met.  ? ? ?Therapy Documentation ?Precautions:  ?Precautions ?Precautions: Fall ?Precaution Comments: R hemi, Bil hip weakness, HOH ?Restrictions ?Weight Bearing Restrictions: No ? ?Pain: see session notes ?ADL: See Care Tool for more details. ? ? ?Therapy/Group: Individual Therapy ? ?Volanda Napoleon MS, OTR/L ? ?11/28/2021, 6:53 AM ?

## 2021-11-28 NOTE — Progress Notes (Signed)
Speech Language Pathology Daily Session Note ? ?Patient Details  ?Name: Ralph Jackson ?MRN: [id number] ?Date of Birth: 1946/05/16 ? ?Today's Date: 11/28/2021 ?SLP Individual Time: 0930-1000 ?SLP Individual Time Calculation (min): 30 min ? ?Short Term Goals: ?Week 1: SLP Short Term Goal 1 (Week 1): STG=LTG due to ELOS ? ?Skilled Therapeutic Interventions: Skilled ST treatment focused on cognitive goals. Patient informed he had been trying to call his daughter on room phone however has been unsuccessful. SLP observed patient attempt to call daughter x2 where he dialed a different number each time with minimal error awareness. It was at this time SLP also discovered that room phone was not working. SLP retrieved a new working phone and provided min A for recalling and dialing correct number following extended time. Patient briefly spoke to daughter and allowed SLP to speak with her for discussion on pt's current status. Daughter informed SLP that she feels patient is at cognitive baseline. She went on to say she provided support with iADLs and some ADLs at prior and does not feel he exhibits new changes to cognition s/p recent CVA. SLP informed pt and daughter of discontinuing cognitive goals at this time however will continue to follow patient for diet tolerance due to report of occasional coughing with liquid intake and report of pharyngeal retention. Pt and daughter verbalized understanding and agreement. Patient was left in wheelchair with alarm activated and immediate needs within reach at end of session. Continue per current plan of care.   ?   ?Pain ?Pain Assessment ?Pain Scale: 0-10 ?Pain Score: 0-No pain ? ?Therapy/Group: Individual Therapy ? ?Carrell Palmatier T Gracie Gupta ?11/28/2021, 11:01 AM ?

## 2021-11-28 NOTE — Progress Notes (Signed)
Patient ID: Ralph Jackson, male   DOB: 1945-10-10, 76 y.o.   MRN: [id number] ?Met with the patient to introduce self, review role, rehab process and plan of care. Patient is HOH with right side weakness. Note constipation addressed; loose stools managed, incont at times.  Reviewed secondary risk management including HTN, HLD and dietary modifications. Patient reported appetite is better w megace;able to eat all of breakfast. Noted concerns about swallowing and periodic coughing spells; feels like something gets caught. Referred to Bri SLP for follow up. Reports daughter managed medications, and she and son in law manage diet. Concerns about home layout; bedroom upstairs. Expressed interest in home chair lift installation, and has a RW and manual w/c at home. Continue to follow along to discharge to address educational needs to facilitate preparation for discharge home w daughter. Dorien Chihuahua B ? ?

## 2021-11-28 NOTE — Progress Notes (Signed)
Patient ID: OM LIZOTTE, male   DOB: [birth year], 76 y.o.   MRN: [id number] ? ?Team Conference Report to Patient/Family ? ?Team Conference discussion was reviewed with the patient and caregiver, including goals, any changes in plan of care and target discharge date.  Patient and caregiver express understanding and are in agreement.  The patient has a target discharge date of 12/07/21. ? ?SW spoke with patient daughter, Victorino Dike and provided team conference updates. Daughter concerned about providing 24/7 supervision at home. Daughter educated about resources and interventions to take at home. Daughter reports she was not aware that her father could not d/c to SNF after CIR stay, sw explained the process. Daughter arranged family education on 4/3 9-12. Daughter will continue to monitor progress to determine if she would like to continue to pursue SNF. SW will continue to follow  up.  ? ?Andria Rhein ?11/28/2021, 2:08 PM  ?

## 2021-11-29 NOTE — Plan of Care (Signed)
?  Problem: RH Swallowing Goal: LTG Patient will consume least restrictive diet using compensatory strategies with assistance (SLP) Description: LTG:  Patient will consume least restrictive diet using compensatory strategies with assistance (SLP) Outcome: Completed/Met   

## 2021-11-29 NOTE — Progress Notes (Signed)
?                                                       PROGRESS NOTE ? ? ?Subjective/Complaints: ? ?Eating 100% of meals , not using IV ?Discussed d/c date  ?ROS- neg CP, SOB, N/V/D ?Objective: ?  ?No results found. ?Recent Labs  ?  11/27/21 ?0604  ?WBC 9.5  ?HGB 10.6*  ?HCT 31.8*  ?PLT 271  ? ? ?Recent Labs  ?  11/27/21 ?0604  ?NA 140  ?K 3.9  ?CL 105  ?CO2 30  ?GLUCOSE 84  ?BUN 30*  ?CREATININE 0.75  ?CALCIUM 8.4*  ? ? ? ?Intake/Output Summary (Last 24 hours) at 11/29/2021 0740 ?Last data filed at 11/28/2021 2300 ?Gross per 24 hour  ?Intake 900 ml  ?Output --  ?Net 900 ml  ? ?  ? ?  ? ?Physical Exam: ?Vital Signs ?Blood pressure 105/70, pulse 94, temperature 98.1 ?F (36.7 ?C), resp. rate 16, height 5\' 1"  (1.549 m), weight 41.9 kg, SpO2 99 %. ? ? ?General: No acute distress ?Mood and affect are appropriate ?Heart: Regular rate and rhythm no rubs murmurs or extra sounds ?Lungs: Clear to auscultation, breathing unlabored, no rales or wheezes ?Abdomen: Positive bowel sounds, soft nontender to palpation, nondistended ?Extremities: No clubbing, cyanosis, or edema ?Skin: No evidence of breakdown, no evidence of rash ?Neurologic: Cranial nerves II through XII intact, motor strength is 5/5 in left 4+ right  deltoid, bicep, tricep, grip, hip flexor, knee extensors, ankle dorsiflexor and plantar flexor ?Sensory exam normal sensation to light touch and proprioception in bilateral upper and lower extremities ?Cerebellar exam normal finger to nose to finger as well as heel to shin in bilateral upper and lower extremities ?Musculoskeletal: Full range of motion in all 4 extremities. No joint swelling ? ? ?Assessment/Plan: ?1. Functional deficits which require 3+ hours per day of interdisciplinary therapy in a comprehensive inpatient rehab setting. ?Physiatrist is providing close team supervision and 24 hour management of active medical problems listed below. ?Physiatrist and rehab team continue to assess barriers to  discharge/monitor patient progress toward functional and medical goals ? ?Care Tool: ? ?Bathing ?   ?Body parts bathed by patient: Right arm, Left arm, Chest, Face  ?   ?  ?  ?Bathing assist Assist Level: Supervision/Verbal cueing ?  ?  ?Upper Body Dressing/Undressing ?Upper body dressing   ?What is the patient wearing?: Pull over shirt ?   ?Upper body assist Assist Level: Supervision/Verbal cueing ?   ?Lower Body Dressing/Undressing ?Lower body dressing ? ? ?   ?What is the patient wearing?: Incontinence brief ? ?  ? ?Lower body assist Assist for lower body dressing: Moderate Assistance - Patient 50 - 74% ?   ? ?Toileting ?Toileting Toileting Activity did not occur and hygiene only):  (condom cath)  ?Toileting assist Assist for toileting: Total Assistance - Patient < 25% ?  ?  ?Transfers ?Chair/bed transfer ? ?Transfers assist ?   ? ?Chair/bed transfer assist level: Minimal Assistance - Patient > 75% ?  ?  ?Locomotion ?Ambulation ? ? ?Ambulation assist ? ?   ? ?Assist level: Minimal Assistance - Patient > 75% ?Assistive device: Walker-rolling ?Max distance: 150 ft  ? ?Walk 10 feet activity ? ? ?Assist ?   ? ?Assist level: Minimal Assistance - Patient >  75% ?Assistive device: Walker-rolling  ? ?Walk 50 feet activity ? ? ?Assist   ? ?Assist level: Minimal Assistance - Patient > 75% ?Assistive device: Walker-rolling  ? ? ?Walk 150 feet activity ? ? ?Assist   ? ?Assist level: Minimal Assistance - Patient > 75% ?Assistive device: Walker-rolling ?  ? ?Walk 10 feet on uneven surface  ?activity ? ? ?Assist Walk 10 feet on uneven surfaces activity did not occur: Safety/medical concerns ? ? ?  ?   ? ?Wheelchair ? ? ? ? ?Assist Is the patient using a wheelchair?: No ?Type of Wheelchair: Manual ?Wheelchair activity did not occur: N/A ? ?  ?   ? ? ?Wheelchair 50 feet with 2 turns activity ? ? ? ?Assist ? ?  ?Wheelchair 50 feet with 2 turns activity did not occur: N/A ? ? ?   ? ?Wheelchair 150 feet activity   ? ? ? ?Assist ? Wheelchair 150 feet activity did not occur: N/A ? ? ?   ? ?Blood pressure 105/70, pulse 94, temperature 98.1 ?F (36.7 ?C), resp. rate 16, height 5\' 1"  (1.549 m), weight 41.9 kg, SpO2 99 %. ? ?Medical Problem List and Plan: ?1. 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            -patient may shower ?            -ELOS/Goals: 4/7 with sup/CGA ? ? ?2.  Antithrombotics: ?-DVT/anticoagulation:  Pharmaceutical: Lovenox ?            -antiplatelet therapy: Aspirin 325 mg daily and Plavix and 5 mg day x90 days ending 01/07/2022 then monotherapy with aspirin 81 mg thereafter ?3. Pain Management: Tylenol as needed ?4. Mood: Lexapro 20 mg daily.  Psychiatry services consulted ?            -antipsychotic agents: N/A ?5. Neuropsych: This patient he is capable of making decisions on his own behalf. ?6. Skin/Wound Care: Routine skin checks ?7. Fluids/Electrolytes/Nutrition: Routine in and out with follow-up chemistries ?8.  Hypothyroidism.  Synthroid ?9.  Hyperlipidemia.  Continue Crestor ?10.  Permissive hypertension.  Resume Lopressor 12.5 mg twice daily ?Vitals:  ? 11/28/21 1416 11/28/21 1946  ?BP: 138/74 105/70  ?Pulse: 66 94  ?Resp: 16 16  ?Temp: 97.8 ?F (36.6 ?C) 98.1 ?F (36.7 ?C)  ?SpO2: 100% 99%  ?Controlled 3/30 ? ?11.  History of alcohol/tobacco abuse.  Now in remission.  Patient will receive counseling ?12.  COVID-19 positive.  Completed course of Paxlovid and airborne precautions discontinued 3/26 ?13. ? UTI.  Urinalysis positive nitrite.    Currently on empiric Keflex. COntiminated specimen UC, UA was + will complete 5 d Keflex, afebrile .  ?14. Underweight: BMI 17.45: provide dietary education. Pt c/o poor appetite, add Megace now taking 100% of meals  ?  ? ?LOS: ?3 days ?A FACE TO FACE EVALUATION WAS PERFORMED ? ?4/26 Ralph Jackson ?11/29/2021, 7:40 AM  ? ? ? ?

## 2021-11-29 NOTE — Progress Notes (Signed)
Occupational Therapy Session Note ? ?Patient Details  ?Name: Ralph Jackson ?MRN: [id number] ?Date of Birth: Feb 09, 1946 ? ?Today's Date: 11/29/2021 ?OT Individual Time: 8889-1694 ?OT Individual Time Calculation (min): 69 min  ? ? ?Short Term Goals: ?Week 1:  OT Short Term Goal 1 (Week 1): STG = LTG 2/2 ELOS ? ?Skilled Therapeutic Interventions/Progress Updates:  ?  Pt received semi-reclined in bed, no c/o pain, agreeable to therapy. Session focus on self-care retraining, activity tolerance, transfer retraining, RUE NMR in prep for improved ADL/IADL/func mobility performance + decreased caregiver burden. Came to sitting EOB with close S, ambulatory toilet transfer with light min A due to forgetting L hand not on RW and R hip weakness. Mod A overall for clothing management and thoroughness of pericare post continent void of bowel, pt initially getting BM all over R hand and dropping soiled toilet paper, requiring max A for clean up.  ? ?Donned shirt EOB with S, donned pants with only CGA this date for standing balance. Short ambulatory transfers throughout session with CGA and RW. Total A w/c transport to and from gym. ? ?Pt participated in various activities to target RUE NMR + bimanual integration: ? -BUE ball toss/catch + bowling ? -2x10 forward/backward circles, chest press, B shoulder flexion, ball toss with 2 lb dowel rod ; noted lag of RUE movement ? -pinch and pull, rolling into log shape, digit flexion, putty squeezes with medium soft theraputty ? ?Pt left seated in recliner with safety belt alarm engaged, call bell in reach, and all immediate needs met.  ? ? ?Therapy Documentation ?Precautions:  ?Precautions ?Precautions: Fall ?Precaution Comments: R hemi, Bil hip weakness, HOH ?Restrictions ?Weight Bearing Restrictions: Yes ? ?Pain: no c/o throughout ?  ?ADL: See Care Tool for more details. ? ?Therapy/Group: Individual Therapy ? ?Volanda Napoleon MS, OTR/L ? ?11/29/2021, 6:57 AM ?

## 2021-11-29 NOTE — Progress Notes (Signed)
Speech Language Pathology Discharge Summary ? ?Patient Details  ?Name: Ralph Jackson ?MRN: [id number] ?Date of Birth: 10-18-1945 ? ?Today's Date: 11/29/2021 ?SLP Individual Time: 6891-5525 ?SLP Individual Time Calculation (min): 45 min ? ?Skilled Therapeutic Interventions:  Skilled ST treatment focused on swallowing goals. SLP facilitated analysis of dysphagia 3 diet and thin liquid tolerance. Patient consumed dys 3 solids with mildly prolonged but effective mastication likely attributed to sparse dentition, adequate oral clearance, and without overt s/sx of aspiration with both solids and thin liquids through straw. Patient performed self feeding with set-up A and mod I for safe swallowing. Recommend patient continue current diet. SLP reinforced safe swallowing precautions including upright positioning and small bites/sips. Plan to sign off on swallowing at this time. Patient was left in recliner with alarm activated and immediate needs within reach at end of session.  ? ?Patient has met 1 of 1 long term goals.  Patient to discharge at overall Modified Independent level.  ?Reasons goals not met: All goals met  ? ?Clinical Impression/Discharge Summary: Pt has demonstrated excellent progress towards meeting swallowing goals meeting 1 out of 1 long-term goal this admission. Cognitive goals were discontinued after speaking with daughter who confirmed patient is likely at baseline function from a cognitive perspective. Daughter was informed over phone and verbalized understanding and agreement. Patient is currently consuming a dysphagia 3 diet and thin liquids with mod I for implementation of safe swallowing precautions. Patient verbalized preference for dys 3 textures secondary to sparse dentition, and consumed "soft" foods at baseline. Pt/family education completed. Care partner is independent to provide the necessary physical and cognitive assistance at discharge. Follow up SLP services are not warranted at this  time, as patient is at baseline function.  ?  ?Care Partner:  ?Caregiver Able to Provide Assistance: Yes  ?Type of Caregiver Assistance: Physical;Cognitive ? ?Recommendation:  ?24 hour supervision/assistance  ?Rationale for SLP Follow Up: Other (comment) (no follow up warranted)  ? ?Equipment: none  ? ?Reasons for discharge: Treatment goals met  ? ?Patient/Family Agrees with Progress Made and Goals Achieved: Yes  ? ? ?Dru Primeau T Trelon Plush ?11/29/2021, 3:37 PM ? ?

## 2021-11-29 NOTE — Progress Notes (Signed)
Physical Therapy Session Note ? ?Patient Details  ?Name: Ralph Jackson ?MRN: [id number] ?Date of Birth: Feb 22, 1946 ? ?Today's Date: 11/29/2021 ?PT Individual Time: 1324-4010 ?PT Individual Time Calculation (min): 70 min  ? ?Short Term Goals: ?Week 1:  PT Short Term Goal 1 (Week 1): Pt will perform bed mobility with consistent supervision and no use of bed features. ?PT Short Term Goal 2 (Week 1): Pt will perform all functional standing transfers with supervision and safe technique. ?PT Short Term Goal 3 (Week 1): Pt will ambulate with improved R trendelenberg using RW with close supervision. ?PT Short Term Goal 4 (Week 1): Pt will complete at least 4 steps with RHR only with consistent MinA. ? ?Skilled Therapeutic Interventions/Progress Updates:  ?Pt received sitting in WC and agreeable to PT. Pt performed stand pivot transfer with RW and CGA from PT for safety. Pt performed WC mobility with min-mod assist from PT iwht max cues for attention to the RUE to prevent veer to the R X 150FT.  ? ? ?Gait training with RW through hall x 130f with min assist for improved posture and max cues to keep BLE within BOS of RW, noted to have improved glute med activation when BLE within RW.  ?Dynamic gait training with RW to weave through 6 cones x 2 bouts. Max cues for improved visual scanning to the R to prevent hitting cone on the R side as well as improved step length on the RLE in turns to reduce fall risk.  ? ?SAutomotive engineerwith BUE supported on 2 rails x 4 and  then  BUE supported L rail on ascent 2 x 4 with min assist. Noted to have improved midline and weight shift when ascent with the RLE and descent with LLE.  ? ?[facility] Step up to airex pad with CGA and RW. Performed lateral reach and toss bean bag to target 2 x 10 BUE with seated therapeutic rest break between bouts.  ? ?Patient returned to room and performed stand pivot to recliner with  RW and close supervision assist for safety with cues for improved step length on the RLE . Pt left sitting in recliner with call bell in reach and all needs met.   ? ?   ? ?Therapy Documentation ?Precautions:  ?Precautions ?Precautions: Fall ?Precaution Comments: R hemi, Bil hip weakness, HOH ?Restrictions ?Weight Bearing Restrictions: Yes ? ?  ?Pain: ?Pain Assessment ?Pain Scale: 0-10 ?Pain Score: 0-No pain ? ? ? ?Therapy/Group: Individual Therapy ? ?ALorie Phenix?11/29/2021, 12:06 PM  ?

## 2021-11-29 NOTE — Progress Notes (Signed)
Per NT patient has been refusing toiletting this shift. This nurse went to discuss toiletting with patient. Patient declined and says that he does not feel like he has to go. Patient consented to checking to see if brief was dry - no incontinent episodes at this time. Attempted to encourage patient to sit on toilet and try to void, patient declined. Offered urinal, patient declined. Discussed bladder scan, patient refused. Patient has been provided fluids throughout the day but is declining to drink them. Has had 175 cc po intake of water. Risks of poor PO intake and not voiding were discussed. Patient states that he understands the risks. Provider Delle Reining - PA notified.  ?

## 2021-11-29 NOTE — IPOC Note (Signed)
Overall Plan of Care (IPOC) ?Patient Details ?Name: Ralph Jackson ?MRN: [id number] ?DOB: 02/07/1946 ? ?Admitting Diagnosis: Subcortical infarction (HCC) ? ?Hospital Problems: Principal Problem: ?  Subcortical infarction [facility]) ? ? ? ? Functional Problem List: ?Nursing Safety, Medication Management, Bowel, Bladder, Endurance, Pain  ?PT Balance, Endurance, Motor, Nutrition, Perception, Safety, Sensory, Skin Integrity  ?OT Balance, Cognition, Endurance, Safety, Motor  ?SLP Cognition  ?TR    ?    ? Basic ADL?s: ?OT Grooming, Bathing, Dressing, Toileting  ? ?  Advanced  ADL?s: ?OT    ?   ?Transfers: ?PT Bed Mobility, Bed to Chair, Car, Furniture  ?OT Toilet, Tub/Shower  ? ?  Locomotion: ?PT Ambulation, Stairs  ? ?  Additional Impairments: ?OT Fuctional Use of Upper Extremity  ?SLP Social Cognition, Swallowing ?  ?Memory, Problem Solving, Awareness  ?TR    ? ? ?Anticipated Outcomes ?Item Anticipated Outcome  ?Self Feeding mod I  ?Swallowing ? mod I ?  ?Basic self-care ? S  ?Toileting ? S ?  ?Bathroom Transfers S  ?Bowel/Bladder ? manage bowel w mod I and bladder w toileting  ?Transfers ? supervision  ?Locomotion ? supervision  ?Communication ?    ?Cognition ? min A  ?Pain ? at or below level 4 with prns  ?Safety/Judgment ? maintain w cues  ? ?Therapy Plan: ?PT Intensity: Minimum of 1-2 x/day ,45 to 90 minutes ?PT Frequency: 5 out of 7 days ?PT Duration Estimated Length of Stay: 10-12 days ?OT Intensity: Minimum of 1-2 x/day, 45 to 90 minutes ?OT Frequency: 5 out of 7 days ?OT Duration/Estimated Length of Stay: 10 to 12 days ?SLP Intensity: Minumum of 1-2 x/day, 30 to 90 minutes ?SLP Frequency: 3 to 5 out of 7 days ?SLP Duration/Estimated Length of Stay: 10-12 days  ? ?Due to the current state of emergency, patients may not be receiving their 3-hours of Medicare-mandated therapy. ? ? Team Interventions: ?Nursing Interventions Disease Management/Prevention, Bladder Management, Medication Management, Discharge Planning,  Pain Management, Bowel Management, Patient/Family Education  ?PT interventions Ambulation/gait training, Discharge planning, Functional mobility training, Psychosocial support, Therapeutic Activities, Visual/perceptual remediation/compensation, Wheelchair propulsion/positioning, Therapeutic Exercise, Skin care/wound management, Neuromuscular re-education, Balance/vestibular training, Disease management/prevention, Cognitive remediation/compensation, DME/adaptive equipment instruction, Pain management, Splinting/orthotics, UE/LE Strength taining/ROM, UE/LE Coordination activities, Stair training, Patient/family education, Functional electrical stimulation, Community reintegration  ?OT Interventions Balance/vestibular training, Disease mangement/prevention, Neuromuscular re-education, Cognitive remediation/compensation, DME/adaptive equipment instruction, Pain management, UE/LE Strength taining/ROM, Wheelchair propulsion/positioning, Self Care/advanced ADL retraining, Therapeutic Exercise, Community reintegration, Patient/family education, UE/LE Coordination activities, Discharge planning, Functional mobility training, Psychosocial support, Therapeutic Activities  ?SLP Interventions Cognitive remediation/compensation, Cueing hierarchy, Dysphagia/aspiration precaution training, Functional tasks, Internal/external aids, Patient/family education, Therapeutic Activities  ?TR Interventions    ?SW/CM Interventions Discharge Planning, Psychosocial Support, Patient/Family Education, Disease Management/Prevention  ? ?Barriers to Discharge ?MD  Medical stability and Nutritional means  ?Nursing Decreased caregiver support, Home environment access/layout ?2 level 4 ste right rail 1/2 bath on main, B+B upstairs w daughter/SIL;  ?PT Inaccessible home environment, Decreased caregiver support, Home environment access/layout, Lack of/limited family support, Insurance for SNF coverage, Weight, Medication compliance, Nutrition means ?    ?OT Decreased caregiver support, Incontinence, Insurance for SNF coverage ?   ?SLP   ?   ?SW Lack of/limited family support, Decreased caregiver support, Insurance for SNF coverage ?   ? ?Team Discharge Planning: ?Destination: PT-Home ,OT- Home , SLP-Home ?Projected Follow-up: PT-Home health PT, Outpatient PT, OT-  Home health OT, SLP-24 hour supervision/assistance, Home Health SLP ?Projected Equipment  Needs: PT-To be determined, OT- To be determined, SLP-To be determined ?Equipment Details: PT- , OT-  ?Patient/family involved in discharge planning: PT- Patient,  OT-Patient, SLP-Patient ? ?MD ELOS: 9-12d ?Medical Rehab Prognosis:  Good ?Assessment: The patient has been admitted for CIR therapies with the diagnosis of Left ACA infarct . The team will be addressing functional mobility, strength, stamina, balance, safety, adaptive techniques and equipment, self-care, bowel and bladder mgt, patient and caregiver education, improving nutrition . Goals have been set at [employer]. Anticipated discharge destination is Home with daughter . ? ?Due to the current state of emergency, patients may not be receiving their 3 hours per day of Medicare-mandated therapy.  ? ? ?See Team Conference Notes for weekly updates to the plan of care ? ?

## 2021-11-30 NOTE — Progress Notes (Signed)
Physical Therapy Session Note ? ?Patient Details  ?Name: Ralph Jackson ?MRN: [id number] ?Date of Birth: 1946-02-23 ? ?Today's Date: 11/30/2021 ?PT Individual Time: 8891-6945 ?PT Individual Time Calculation (min): 59 min  ? ?Short Term Goals: ?Week 1:  PT Short Term Goal 1 (Week 1): Pt will perform bed mobility with consistent supervision and no use of bed features. ?PT Short Term Goal 2 (Week 1): Pt will perform all functional standing transfers with supervision and safe technique. ?PT Short Term Goal 3 (Week 1): Pt will ambulate with improved R trendelenberg using RW with close supervision. ?PT Short Term Goal 4 (Week 1): Pt will complete at least 4 steps with RHR only with consistent MinA. ? ?Skilled Therapeutic Interventions/Progress Updates:  ?Patient curled up in recliner and asleep on entrance to room. Patient easily roused, then alert and agreeable to PT session.  ? ?Patient with no pain complaint throughout session. ? ?Therapeutic Activity: ?Bed Mobility: Patient performed sit -->supine at end of session with supervision. VC required for positioning to assist with move toward HOB. Transfers: Patient performed sit<>stand and stand pivot transfers throughout session with close supervision/ CGA. Provided verbal cues for hand placement.  ? ?Gait Training:  ?Patient ambulated 39' x1/ >250' x1 using RW with CGA. Demonstrated decreased hip and knee flexion during swing phase of RLE. Provided vc/ tc for increasing step height and length with minimal correction. Provided with theraband as visual cue tied onto walker for increasing knee height during gait training.  Ambulation with therband on walker to promote increased R hip/ knee flexion and amb with RLE march. Pt able to cover 100' with good focus on high knee march on RLE.  ? ?Pt also guided in lateral ambulation toward the R covering 15 feet in order to promote strengthening and increased recruitment of G. Med and Min.  Provided with Bil HHA from therapist.   ? ?Neuromuscular Re-ed: ?NMR facilitated during session with focus on RLE muscle recruitment and increased coordinated movement. Pt guided in continuous reciprocation of BUE and BLE using NuStep L3 x with focus on RLE alignment and extension. Provided with tactile and visual cue of therapist hand to target with touch of RLE in order to promote increased ER.  ? ?Pt guided in toe touches at 6" step toward cone placed just lateral to neutral hip alignment, progressing from 1st step only to 1st then to 2nd step with good foot clearance. NMR performed for improvements in motor control and coordination, balance, sequencing, judgement, and self confidence/ efficacy in performing all aspects of mobility at highest level of independence.  ? ?Patient supine  in bed at end of session with brakes locked, bed alarm set, and all needs within reach. ? ? ?Therapy Documentation ?Precautions:  ?Precautions ?Precautions: Fall ?Precaution Comments: R hemi, Bil hip weakness, HOH ?Restrictions ?Weight Bearing Restrictions: Yes ?General: ?  ?Vital Signs: ?Therapy Vitals ?Temp: 98.7 ?F (37.1 ?C) ?Temp Source: Oral ?Pulse Rate: 73 ?Resp: 20 ?BP: 100/60 ?Patient Position (if appropriate): Lying ?Oxygen Therapy ?SpO2: 100 % ?O2 Device: Room Air ?Pain: ? No pain complaint this session.  ? ?Therapy/Group: Individual Therapy ? ?Loel Dubonnet PT, DPT ?11/30/2021, 5:51 PM  ?

## 2021-11-30 NOTE — Progress Notes (Signed)
Physical Therapy Session Note ? ?Patient Details  ?Name: Ralph Jackson ?MRN: [id number] ?Date of Birth: May 09, 1946 ? ?Today's Date: 11/30/2021 ?PT Individual Time: 5809-9833 ?PT Individual Time Calculation (min): 70 min  ? ?Short Term Goals: ?Week 1:  PT Short Term Goal 1 (Week 1): Pt will perform bed mobility with consistent supervision and no use of bed features. ?PT Short Term Goal 2 (Week 1): Pt will perform all functional standing transfers with supervision and safe technique. ?PT Short Term Goal 3 (Week 1): Pt will ambulate with improved R trendelenberg using RW with close supervision. ?PT Short Term Goal 4 (Week 1): Pt will complete at least 4 steps with RHR only with consistent MinA. ? ?Skilled Therapeutic Interventions/Progress Updates: Pt presented in recliner agreeable to therapy. Pt denies pain at rest. Performed Sit to stand with CGA and ambulated ~156ft with RW in hallway. Pt noted to have narrow BOS decreased R knee flexion and shortened step length. Pt transported remaining distance to 4th fl rehab gym and participated in step overs with hockey stick with emphasis on RLE. With cues pt able to consistently clear stick however limited carryover. Pt then participated in ambulation in parallel bars with use of agility ladder to encourage increased step width. Pt required max multimodal cues to maintain hips aligned vs rotated when stepping to R. Pt also participated in weaving through cones 20ft x 4 with CGA and mod cues for maintaining close proximity to RW as well as safely navigating around cones to R as pt hit two of them with RW. Participated in ascending/descending stairs x 2. First bout with B rails with pt performing in step through patter ascending and step to patten descending. Pt then performed with R rail only performing with light minA. Pt encouraged to attempt to increase abduction when stepping with RLE as LE tend to rub against step. Pt transported back to 5th floor and participated  in NuStep L3 x 9 min for cardiovascular conditioning and general endurance. Pt then ambulated back to room with RW and CGA and returned to recliner. Pt then states urgency to use bath room , performed ambulatory transfer to toilet with CGA and was CGA for toilet transfers and clothing management. Pt left at toilet verbalizing understanding to pull call string once completed with nsg and NT notified of pt's disposition.   ?   ? ?Therapy Documentation ?Precautions:  ?Precautions ?Precautions: Fall ?Precaution Comments: R hemi, Bil hip weakness, HOH ?Restrictions ?Weight Bearing Restrictions: Yes ?General: ?  ?Vital Signs: ?Therapy Vitals ?Temp: 98.7 ?F (37.1 ?C) ?Temp Source: Oral ?Pulse Rate: 73 ?Resp: 20 ?BP: 100/60 ?Patient Position (if appropriate): Lying ?Oxygen Therapy ?SpO2: 100 % ?O2 Device: Room Air ?Pain: ?  ?Mobility: ?  ?Locomotion : ?   ?Trunk/Postural Assessment : ?   ?Balance: ?  ?Exercises: ?  ?Other Treatments:   ? ? ? ?Therapy/Group: Individual Therapy ? ?[name] ?11/30/2021, 4:30 PM  ?

## 2021-11-30 NOTE — Progress Notes (Signed)
Occupational Therapy Session Note ? ?Patient Details  ?Name: Ralph Jackson ?MRN: [id number] ?Date of Birth: Jun 03, 1946 ? ?Today's Date: 11/30/2021 ?OT Individual Time: 0703-0800 ?OT Individual Time Calculation (min): 57 min  ? ? ?Short Term Goals: ?Week 1:  OT Short Term Goal 1 (Week 1): STG = LTG 2/2 ELOS ? ?Skilled Therapeutic Interventions/Progress Updates:  ?  Pt received semi-reclined in bed eating breakfast with NT present, no c/o pain, agreeable to therapy. Session focus on self-care retraining, activity tolerance, RUE NMR in prep for improved ADL/IADL/func mobility performance + decreased caregiver burden. Agreeable to transfer to chair for better positioning to finish meal. Came to sitting EOB close S and use of bed rail. Donned pants with min A to thread foley bag and thread RLE. CGA for sit to stand with RW, total A to change out broken brief. Short ambulatory transfer > recliner with RW and CGA, minor LOB into chair when backing up due to posterior bias.  ? ?Pt able to set-up/ open majority containers for breakfast with BUE , elected to self-feed with nondominant L hand despite encouragement to use R for continued NMR. Did spill coffee on himself with poor awareness, changed out shirt with S and increased time. ? ?Ambulated > toilet with CGA and RW. Continent of bladder, S for anterior seated pericare and CGA for balance during clothing management. Declined further need for ADL. ? ?Completed 2x10 of the following to target RUE NMR: overhead reaches, cross body twists, chest press with 4 lb medicine ball + pull apart and rolling into log shape with medium soft theraputty. ? ? ?Pt left seated in recliner with safety belt alarm engaged, call bell in reach, and all immediate needs met.  ? ? ? ?Therapy Documentation ?Precautions:  ?Precautions ?Precautions: Fall ?Precaution Comments: R hemi, Bil hip weakness, HOH ?Restrictions ?Weight Bearing Restrictions: Yes ? ?Pain: ?Pain Assessment ?Pain Score: 0-No  pain ?ADL: See Care Tool for more details. ? ? ?Therapy/Group: Individual Therapy ? ?Volanda Napoleon MS, OTR/L ? ?11/30/2021, 7:00 AM ?

## 2021-11-30 NOTE — Progress Notes (Signed)
?                                                       PROGRESS NOTE ? ? ?Subjective/Complaints: ? ?No issues overnite , discussed BP , no dizziness, appetite remains good  ? ?ROS- neg CP, SOB, N/V/D ?Objective: ?  ?No results found. ?No results for input(s): WBC, HGB, HCT, PLT in the last 72 hours. ? ?No results for input(s): NA, K, CL, CO2, GLUCOSE, BUN, CREATININE, CALCIUM in the last 72 hours. ? ? ?Intake/Output Summary (Last 24 hours) at 11/30/2021 0813 ?Last data filed at 11/29/2021 1400 ?Gross per 24 hour  ?Intake 480 ml  ?Output --  ?Net 480 ml  ? ?  ? ?  ? ?Physical Exam: ?Vital Signs ?Blood pressure (!) 95/57, pulse 66, temperature 97.8 ?F (36.6 ?C), resp. rate 18, height 5\' 1"  (1.549 m), weight 41.9 kg, SpO2 98 %. ? ? ?General: No acute distress ?Mood and affect are appropriate ?Heart: Regular rate and rhythm no rubs murmurs or extra sounds ?Lungs: Clear to auscultation, breathing unlabored, no rales or wheezes ?Abdomen: Positive bowel sounds, soft nontender to palpation, nondistended ?Extremities: No clubbing, cyanosis, or edema ?Skin: No evidence of breakdown, no evidence of rash ?Neurologic: Cranial nerves II through XII intact, motor strength is 5/5 in left 4+ right  deltoid, bicep, tricep, grip, hip flexor, knee extensors, ankle dorsiflexor and plantar flexor ?Sensory exam normal sensation to light touch and proprioception in bilateral upper and lower extremities ?Cerebellar exam normal finger to nose to finger as well as heel to shin in bilateral upper and lower extremities ?Musculoskeletal: Full range of motion in all 4 extremities. No joint swelling ? ? ?Assessment/Plan: ?1. Functional deficits which require 3+ hours per day of interdisciplinary therapy in a comprehensive inpatient rehab setting. ?Physiatrist is providing close team supervision and 24 hour management of active medical problems listed below. ?Physiatrist and rehab team continue to assess barriers to discharge/monitor patient  progress toward functional and medical goals ? ?Care Tool: ? ?Bathing ?   ?Body parts bathed by patient: Right arm, Left arm, Chest, Face  ?   ?  ?  ?Bathing assist Assist Level: Supervision/Verbal cueing ?  ?  ?Upper Body Dressing/Undressing ?Upper body dressing   ?What is the patient wearing?: Pull over shirt ?   ?Upper body assist Assist Level: Supervision/Verbal cueing ?   ?Lower Body Dressing/Undressing ?Lower body dressing ? ? ?   ?What is the patient wearing?: Pants ? ?  ? ?Lower body assist Assist for lower body dressing: Contact Guard/Touching assist ?   ? ?Toileting ?Toileting Toileting Activity did not occur and hygiene only):  (condom cath)  ?Toileting assist Assist for toileting: Moderate Assistance - Patient 50 - 74% ?  ?  ?Transfers ?Chair/bed transfer ? ?Transfers assist ?   ? ?Chair/bed transfer assist level: Minimal Assistance - Patient > 75% ?  ?  ?Locomotion ?Ambulation ? ? ?Ambulation assist ? ?   ? ?Assist level: Minimal Assistance - Patient > 75% ?Assistive device: Walker-rolling ?Max distance: 150 ft  ? ?Walk 10 feet activity ? ? ?Assist ?   ? ?Assist level: Minimal Assistance - Patient > 75% ?Assistive device: Walker-rolling  ? ?Walk 50 feet activity ? ? ?Assist   ? ?Assist level: Minimal Assistance - Patient > 75% ?  Assistive device: Walker-rolling  ? ? ?Walk 150 feet activity ? ? ?Assist   ? ?Assist level: Minimal Assistance - Patient > 75% ?Assistive device: Walker-rolling ?  ? ?Walk 10 feet on uneven surface  ?activity ? ? ?Assist Walk 10 feet on uneven surfaces activity did not occur: Safety/medical concerns ? ? ?  ?   ? ?Wheelchair ? ? ? ? ?Assist Is the patient using a wheelchair?: No ?Type of Wheelchair: Manual ?  ? ?  ?   ? ? ?Wheelchair 50 feet with 2 turns activity ? ? ? ?Assist ? ?  ?  ? ? ?   ? ?Wheelchair 150 feet activity  ? ? ? ?Assist ?   ? ? ?   ? ?Blood pressure (!) 95/57, pulse 66, temperature 97.8 ?F (36.6 ?C), resp. rate 18, height 5\' 1"  (1.549 m),  weight 41.9 kg, SpO2 98 %. ? ?Medical Problem List and Plan: ?1. 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            -patient may shower ?            -ELOS/Goals: 4/7 with sup/CGA ? ? ?2.  Antithrombotics: ?-DVT/anticoagulation:  Pharmaceutical: Lovenox ?            -antiplatelet therapy: Aspirin 325 mg daily and Plavix and 5 mg day x90 days ending 01/07/2022 then monotherapy with aspirin 81 mg thereafter ?3. Pain Management: Tylenol as needed ?4. Mood: Lexapro 20 mg daily.  Psychiatry services consulted ?            -antipsychotic agents: N/A ?5. Neuropsych: This patient he is capable of making decisions on his own behalf. ?6. Skin/Wound Care: Routine skin checks ?7. Fluids/Electrolytes/Nutrition: Routine in and out with follow-up chemistries ?8.  Hypothyroidism.  Synthroid ?9.  Hyperlipidemia.  Continue Crestor ?10.  HTN  ?Vitals:  ? 11/29/21 1951 11/30/21 0531  ?BP: 109/66 (!) 95/57  ?Pulse: 70 66  ?Resp: 16 18  ?Temp: 98.4 ?F (36.9 ?C) 97.8 ?F (36.6 ?C)  ?SpO2: 99% 98%  ?Controlled 3/31, running a little low will hold and monitor , intake is improving  ? ?11.  History of alcohol/tobacco abuse.  Now in remission.  Patient will receive counseling ?12.  COVID-19 positive.  Completed course of Paxlovid and airborne precautions discontinued 3/26 ?13. ? UTI.  Urinalysis positive nitrite.    Currently on empiric Keflex. COntiminated specimen UC, UA was + will complete 5 d Keflex, afebrile .  ?14. Underweight: BMI 17.45: provide dietary education. Pt c/o poor appetite, add Megace now taking 100% of meals  ?  ? ?LOS: ?4 days ?A FACE TO FACE EVALUATION WAS PERFORMED ? ?4/26 Rosiland Sen ?11/30/2021, 8:13 AM  ? ? ? ?

## 2021-12-01 NOTE — Progress Notes (Signed)
Occupational Therapy Session Note ? ?Patient Details  ?Name: Ralph Jackson ?MRN: [id number] ?Date of Birth: 1946/02/03 ? ?Today's Date: 12/01/2021 ?OT Individual Time: 0702-0806 ?OT Individual Time Calculation (min): 64 min  ? ? ?Short Term Goals: ?Week 1:  OT Short Term Goal 1 (Week 1): STG = LTG 2/2 ELOS ? ?Skilled Therapeutic Interventions/Progress Updates:  ?  Pt received semi-reclined in bed finishing breakfast, denies pain, agreeable to therapy. Session focus on self-care retraining, activity tolerance, RUE NMR in prep for improved ADL/IADL/func mobility performance + decreased caregiver burden. ?Self-fed breakfast with dominant R hand this date with mod I but required assist to open and not spill coffee. ? ?Close S for bed mobility and LBD, ind in UBD. Combed hair with min A for thoroughness, declined shower/additional ADL. Total A w/c transport to and from gym.  ? ?To target RUE NMR, pt stood at [facility] to complete the following games/ 1 lb wrist weight on RUE throughout: ? -Single Target x2: 49.79% accuracy, 2.43 second reaction time, 49 hits ; 50.43% accuracy, 2/01 reaction time, 59 hits ? -Bell Cancellation: 7 min 14 seconds, required min cues to locate final targets on his R, 6 distracters hit ? ?Pt able to grasp and release levels 1 through 3 resistive clothes pin to place on horizontal bar, did require min A from L hand to achieve pincer grasp.  ? ?Ambulatory toilet transfer min A due to pt lifting up RW and to guide RW into turn. CGA for LB clothing management, mod A for thoroughness of pericare post continent BM. Pt continues to soil hands/clothing/toilet seat when attempting to cleanse BM.  ? ?Washed hands at sink CGA. LOB onto bed when going to turn into recliner.  ? ?Pt left seated in recliner with safety belt alarm engaged, call bell in reach, and all immediate needs met.  ? ? ?Therapy Documentation ?Precautions:  ?Precautions ?Precautions: Fall ?Precaution Comments: R hemi, Bil hip weakness,  HOH ?Restrictions ?Weight Bearing Restrictions: Yes ? ?Pain: denies ?ADL: See Care Tool for more details. ? ? ?Therapy/Group: Individual Therapy ? ?Volanda Napoleon MS, OTR/L ? ?12/01/2021, 6:58 AM ?

## 2021-12-01 NOTE — Progress Notes (Signed)
Physical Therapy Session Note ? ?Patient Details  ?Name: Ralph Jackson ?MRN: [id number] ?Date of Birth: [birth year] ? ?Today's Date: 12/01/2021 ?PT Individual Time: 7505-1071 ?PT Individual Time Calculation (min): 15 min  ? ?Short Term Goals: ?Week 1:  PT Short Term Goal 1 (Week 1): Pt will perform bed mobility with consistent supervision and no use of bed features. ?PT Short Term Goal 2 (Week 1): Pt will perform all functional standing transfers with supervision and safe technique. ?PT Short Term Goal 3 (Week 1): Pt will ambulate with improved R trendelenberg using RW with close supervision. ?PT Short Term Goal 4 (Week 1): Pt will complete at least 4 steps with RHR only with consistent MinA. ? ? ?Skilled Therapeutic Interventions/Progress Updates:  ? ?Pt received supine in bed and agreeable to PT. Supine>sit transfer without assist or cues. Ambulatory transfer to toilet in bathroom with supervision assist-CGA for improved management of RW around doorway. Pt able void blodder sitting on toilet. Clothing management performed with supervision assist from PT with intermittent UE support on L side rail.  Pt returned to room and performed ambulatory transfer to bed with RW and supervision assist. Sit>supine completed without assist, and left supine in bed with call bell in reach and all needs met.  ? ? ?   ? ?Therapy Documentation ?Precautions:  ?Precautions ?Precautions: Fall ?Precaution Comments: R hemi, Bil hip weakness, HOH ?Restrictions ?Weight Bearing Restrictions: Yes ?Missed time: 30 min : pt fatigue.  ?  ?Vital Signs: ?Therapy Vitals ?Temp: 98.8 ?F (37.1 ?C) ?Temp Source: Oral ?Pulse Rate: 66 ?Resp: 18 ?BP: 97/62 ?Patient Position (if appropriate): Lying ?Oxygen Therapy ?SpO2: 99 % ?O2 Device: Room Air ?Pain: ?  denies ? ? ? ? ?Therapy/Group: Individual Therapy ? ?Lorie Phenix ?12/01/2021, 4:59 PM  ?

## 2021-12-01 NOTE — Progress Notes (Signed)
Physical Therapy Session Note ? ?Patient Details  ?Name: Ralph Jackson ?MRN: [id number] ?Date of Birth: May 28, 1946 ? ?Today's Date: 12/01/2021 ?PT Individual Time: 2831-5176 ?PT Individual Time Calculation (min): 28 min  ? ?Short Term Goals: ?Week 1:  PT Short Term Goal 1 (Week 1): Pt will perform bed mobility with consistent supervision and no use of bed features. ?PT Short Term Goal 2 (Week 1): Pt will perform all functional standing transfers with supervision and safe technique. ?PT Short Term Goal 3 (Week 1): Pt will ambulate with improved R trendelenberg using RW with close supervision. ?PT Short Term Goal 4 (Week 1): Pt will complete at least 4 steps with RHR only with consistent MinA. ? ? ?Skilled Therapeutic Interventions/Progress Updates:  ?Patient seated in recliner on entrance to room. Patient alert and agreeable to PT session.  ? ?Patient with no pain complaint throughout session. ? ?Therapeutic Activity: ?Transfers: Patient performed sit<>stand and stand pivot transfers throughout session with CGA/ supervision using RW. Provided verbal cues for technique including reaching back for armrest prior to sit and for maintaining RW position in front of pt throughout pivot turn to sit.  ? ?Gait Training:  ?Patient ambulated 160' x1 using RW with CGA. Provided vc/ tc for increased R hip/ knee flexion as well as for heel strike. Pt does demo ability to increase step height/ length but has limited ability to produce coordinated heel strike. ? ?Therapeutic Exercise: ?Pt complains of need for UE exercises to continue to improve shoulder strength. Pt performed the following exercises to RUE with vc/ tc for proper technique: ?- elbow flexion with resisted  ?- shoulder abduction/ lateral raise  ?- shoulder flexion/ anterior raise ?- shoulder ER/ IR ?- scapular retraction ? ?All performed x20  and with resisted, eccentric return. ? ?Patient seated upright  in recliner at end of session with brakes locked, belt alarm  set, and all needs within reach. ? ? ?Therapy Documentation ?Precautions:  ?Precautions ?Precautions: Fall ?Precaution Comments: R hemi, Bil hip weakness, HOH ?Restrictions ?Weight Bearing Restrictions: Yes ?General: ? ?Vital Signs: ? ?Pain: ? No pain complaint this session.  ? ?Therapy/Group: Individual Therapy ? ?Loel Dubonnet PT, DPT ?12/01/2021, 5:59 PM  ?

## 2021-12-01 NOTE — Progress Notes (Signed)
Physical Therapy Session Note ? ?Patient Details  ?Name: Ralph Jackson ?MRN: [id number] ?Date of Birth: 08-24-1946 ? ?Today's Date: 12/01/2021 ?PT Individual Time: 1001-1110 ?PT Individual Time Calculation (min): 69 min  ? ?Short Term Goals: ?Week 1:  PT Short Term Goal 1 (Week 1): Pt will perform bed mobility with consistent supervision and no use of bed features. ?PT Short Term Goal 2 (Week 1): Pt will perform all functional standing transfers with supervision and safe technique. ?PT Short Term Goal 3 (Week 1): Pt will ambulate with improved R trendelenberg using RW with close supervision. ?PT Short Term Goal 4 (Week 1): Pt will complete at least 4 steps with RHR only with consistent MinA. ? ?Skilled Therapeutic Interventions/Progress Updates:  ? ?Pt received sitting in WC and agreeable to PT. Sit<>stand with supervision assist to doff soild brief from incontinent bladder. Supervision assist for clothing management and cues for safety. Pt performed stand pivot transfer with RW to [employer] and supervision assist from PT for safety.  ? ?Pt performed WC mobility through hall on 5C x 131f with min assist and moderate cues for improved attnetion to the RUE to prevent veer to the R. Assist required to prevent hitting wall on the R constantly.  ? ?PT instructed pt in gait training with RW x 1236fwith supervision assist-CGA with cues for improved position on RW to allow improved sequencing and posture to encourage RLE glute med activation. Dynamic gait training with RW to weave through 6 cones x 2 with supervision assist-CGA and min=mod cues for improved positioning within RW to encoutage glute med activation. Noted to have imld circumduation on the RLE with limb advancement.  ? ?Foot tapping on 6 inch cone to force hip/knee flexion with L advancement performed 2 x 10 BLE with BUE supported on RW. Cues for wide BOS on the RLE when returning to starting position. Mild ataxia noted on RLE.  ? ?Pt reported need for urination.  [facility] in standing with UE supported on rail and supervision assist. Stand pivot [facility] and UE supported on arm rest. Left sitting recliner with call bell in reach and all needs met.  ? ? ?   ? ?Therapy Documentation ?Precautions:  ?Precautions ?Precautions: Fall ?Precaution Comments: R hemi, Bil hip weakness, HOH ?Restrictions ?Weight Bearing Restrictions: Yes ? ? ?Pain: ? denies ? ? ?Therapy/Group: Individual Therapy ? ?AuLorie Phenix4/09/2021, 12:29 PM  ?

## 2021-12-02 NOTE — Progress Notes (Signed)
?                                                       PROGRESS NOTE ? ? ?Subjective/Complaints: ? ?No new issues , pt feeling ok today  ? ?ROS- neg CP, SOB, N/V/D ?Objective: ?  ?No results found. ?No results for input(s): WBC, HGB, HCT, PLT in the last 72 hours. ? ?No results for input(s): NA, K, CL, CO2, GLUCOSE, BUN, CREATININE, CALCIUM in the last 72 hours. ? ? ?Intake/Output Summary (Last 24 hours) at 12/02/2021 0914 ?Last data filed at 12/02/2021 0735 ?Gross per 24 hour  ?Intake 530 ml  ?Output --  ?Net 530 ml  ? ?  ? ?  ? ?Physical Exam: ?Vital Signs ?Blood pressure 109/67, pulse 74, temperature 98.3 ?F (36.8 ?C), temperature source Oral, resp. rate 18, height 5\' 1"  (1.549 m), weight 41.9 kg, SpO2 97 %. ? ? ?General: No acute distress ?Mood and affect are appropriate ?Heart: Regular rate and rhythm no rubs murmurs or extra sounds ?Lungs: Clear to auscultation, breathing unlabored, no rales or wheezes ?Abdomen: Positive bowel sounds, soft nontender to palpation, nondistended ?Extremities: No clubbing, cyanosis, or edema ?Skin: No evidence of breakdown, no evidence of rash ?Neurologic: Cranial nerves II through XII intact, motor strength is 5/5 in left 4+ right  deltoid, bicep, tricep, grip, hip flexor, knee extensors, ankle dorsiflexor and plantar flexor ?Sensory exam normal sensation to light touch and proprioception in bilateral upper and lower extremities ?Cerebellar exam normal finger to nose to finger as well as heel to shin in bilateral upper and lower extremities ?Musculoskeletal: Full range of motion in all 4 extremities. No joint swelling ? ? ?Assessment/Plan: ?1. Functional deficits which require 3+ hours per day of interdisciplinary therapy in a comprehensive inpatient rehab setting. ?Physiatrist is providing close team supervision and 24 hour management of active medical problems listed below. ?Physiatrist and rehab team continue to assess barriers to discharge/monitor patient progress toward  functional and medical goals ? ?Care Tool: ? ?Bathing ?   ?Body parts bathed by patient: Right arm, Left arm, Chest, Face  ?   ?  ?  ?Bathing assist Assist Level: Supervision/Verbal cueing ?  ?  ?Upper Body Dressing/Undressing ?Upper body dressing   ?What is the patient wearing?: Pull over shirt ?   ?Upper body assist Assist Level: Supervision/Verbal cueing ?   ?Lower Body Dressing/Undressing ?Lower body dressing ? ? ?   ?What is the patient wearing?: Pants ? ?  ? ?Lower body assist Assist for lower body dressing: Contact Guard/Touching assist ?   ? ?Toileting ?Toileting Toileting Activity did not occur Landscape architect and hygiene only):  (condom cath)  ?Toileting assist Assist for toileting: Moderate Assistance - Patient 50 - 74% ?  ?  ?Transfers ?Chair/bed transfer ? ?Transfers assist ?   ? ?Chair/bed transfer assist level: Minimal Assistance - Patient > 75% ?  ?  ?Locomotion ?Ambulation ? ? ?Ambulation assist ? ?   ? ?Assist level: Minimal Assistance - Patient > 75% ?Assistive device: Walker-rolling ?Max distance: 150 ft  ? ?Walk 10 feet activity ? ? ?Assist ?   ? ?Assist level: Minimal Assistance - Patient > 75% ?Assistive device: Walker-rolling  ? ?Walk 50 feet activity ? ? ?Assist   ? ?Assist level: Minimal Assistance - Patient > 75% ?Assistive device:  Walker-rolling  ? ? ?Walk 150 feet activity ? ? ?Assist   ? ?Assist level: Minimal Assistance - Patient > 75% ?Assistive device: Walker-rolling ?  ? ?Walk 10 feet on uneven surface  ?activity ? ? ?Assist Walk 10 feet on uneven surfaces activity did not occur: Safety/medical concerns ? ? ?  ?   ? ?Wheelchair ? ? ? ? ?Assist Is the patient using a wheelchair?: No ?Type of Wheelchair: Manual ?  ? ?  ?   ? ? ?Wheelchair 50 feet with 2 turns activity ? ? ? ?Assist ? ?  ?  ? ? ?   ? ?Wheelchair 150 feet activity  ? ? ? ?Assist ?   ? ? ?   ? ?Blood pressure 109/67, pulse 74, temperature 98.3 ?F (36.8 ?C), temperature source Oral, resp. rate 18, height 5\' 1"  (1.549  m), weight 41.9 kg, SpO2 97 %. ? ?Medical Problem List and Plan: ?1. 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            -patient may shower ?            -ELOS/Goals: 4/7 with sup/CGA ? ? ?2.  Antithrombotics: ?-DVT/anticoagulation:  Pharmaceutical: Lovenox ?            -antiplatelet therapy: Aspirin 325 mg daily and Plavix and 5 mg day x90 days ending 01/07/2022 then monotherapy with aspirin 81 mg thereafter ?3. Pain Management: Tylenol as needed ?4. Mood: Lexapro 20 mg daily.  Psychiatry services consulted ?            -antipsychotic agents: N/A ?5. Neuropsych: This patient he is capable of making decisions on his own behalf. ?6. Skin/Wound Care: Routine skin checks ?7. Fluids/Electrolytes/Nutrition: Routine in and out with follow-up chemistries ?8.  Hypothyroidism.  Synthroid ?9.  Hyperlipidemia.  Continue Crestor ?10.  HTN  ?Vitals:  ? 12/01/21 1951 12/02/21 0815  ?BP: (!) 112/55 109/67  ?Pulse: 72 74  ?Resp: 18   ?Temp: 98.3 ?F (36.8 ?C)   ?SpO2: 97%   ?Controlled 4/2 ? ?11.  History of alcohol/tobacco abuse.  Now in remission.  Patient will receive counseling ?12.  COVID-19 positive.  Completed course of Paxlovid and airborne precautions discontinued 3/26 ?13. ? UTI.  Urinalysis positive nitrite.    Currently on empiric Keflex. COntiminated specimen UC, UA was + will complete 5 d Keflex, afebrile .  ?14. Underweight: BMI 17.45: provide dietary education. Pt c/o poor appetite, add Megace now taking 100% of meals  ?  ? ?LOS: ?6 days ?A FACE TO FACE EVALUATION WAS PERFORMED ? ?Luanna Salk Kentrell Guettler ?12/02/2021, 9:14 AM  ? ? ? ?

## 2021-12-03 DIAGNOSIS — I10 Essential (primary) hypertension: Secondary | ICD-10-CM

## 2021-12-03 DIAGNOSIS — N390 Urinary tract infection, site not specified: Secondary | ICD-10-CM

## 2021-12-03 DIAGNOSIS — A499 Bacterial infection, unspecified: Secondary | ICD-10-CM

## 2021-12-03 LAB — URINALYSIS, COMPLETE (UACMP) WITH MICROSCOPIC
Bacteria, UA: NONE SEEN
Bilirubin Urine: NEGATIVE
Glucose, UA: NEGATIVE mg/dL
Hgb urine dipstick: NEGATIVE
Ketones, ur: NEGATIVE mg/dL
Leukocytes,Ua: NEGATIVE
Nitrite: NEGATIVE
Protein, ur: NEGATIVE mg/dL
Specific Gravity, Urine: 1.017 (ref 1.005–1.030)
pH: 6 (ref 5.0–8.0)

## 2021-12-03 LAB — CREATININE, SERUM
Creatinine, Ser: 0.78 mg/dL (ref 0.61–1.24)
GFR, Estimated: >60 mL/min (ref >60)

## 2021-12-03 NOTE — Progress Notes (Signed)
?                                                       PROGRESS NOTE ? ? ?Subjective/Complaints: ?Pt noticed urinary urgency this am. Otherwise doing well.  ? ?ROS: Patient denies fever, rash, sore throat, blurred vision, dizziness, nausea, vomiting, diarrhea, cough, shortness of breath or chest pain, joint or back/neck pain, headache, or mood change.  ? ?Objective: ?  ?No results found. ?No results for input(s): WBC, HGB, HCT, PLT in the last 72 hours. ? ?Recent Labs  ?  12/03/21 ?8119  ?CREATININE 0.78  ? ? ?Intake/Output Summary (Last 24 hours) at 12/03/2021 1343 ?Last data filed at 12/03/2021 1330 ?Gross per 24 hour  ?Intake 678 ml  ?Output 1800 ml  ?Net -1122 ml  ?  ? ?  ? ?Physical Exam: ?Vital Signs ?Blood pressure 117/66, pulse 80, temperature 99.3 ?F (37.4 ?C), temperature source Oral, resp. rate 18, height 5\' 1"  (1.549 m), weight 41.9 kg, SpO2 98 %. ? ? ?Constitutional: No distress . Vital signs reviewed. Frail appearing ?HEENT: NCAT, EOMI, oral membranes moist ?Neck: supple ?Cardiovascular: RRR without murmur. No JVD    ?Respiratory/Chest: CTA Bilaterally without wheezes or rales. Normal effort    ?GI/Abdomen: BS +, non-tender, non-distended ?Ext: no clubbing, cyanosis, or edema ?Psych: pleasant and cooperative  ?Skin: No evidence of breakdown, no evidence of rash ?Neurologic: Cranial nerves II through XII intact, motor strength is 5/5 in left 4+ right  deltoid, bicep, tricep, grip, hip flexor, knee extensors, ankle dorsiflexor and plantar flexor ?Sensory exam normal for light touch and pain in all 4 limbs. No limb ataxia or cerebellar signs. No abnormal tone appreciated.   ?Musculoskeletal: Full range of motion in all 4 extremities. No joint swelling ? ? ?Assessment/Plan: ?1. Functional deficits which require 3+ hours per day of interdisciplinary therapy in a comprehensive inpatient rehab setting. ?Physiatrist is providing close team supervision and 24 hour management of active medical problems listed  below. ?Physiatrist and rehab team continue to assess barriers to discharge/monitor patient progress toward functional and medical goals ? ?Care Tool: ? ?Bathing ?   ?Body parts bathed by patient: Right arm, Left arm, Chest, Face  ?   ?  ?  ?Bathing assist Assist Level: Supervision/Verbal cueing ?  ?  ?Upper Body Dressing/Undressing ?Upper body dressing   ?What is the patient wearing?: Pull over shirt ?   ?Upper body assist Assist Level: Supervision/Verbal cueing ?   ?Lower Body Dressing/Undressing ?Lower body dressing ? ? ?   ?What is the patient wearing?: Pants ? ?  ? ?Lower body assist Assist for lower body dressing: Contact Guard/Touching assist ?   ? ?Toileting ?Toileting Toileting Activity did not occur and hygiene only):  (condom cath)  ?Toileting assist Assist for toileting: Moderate Assistance - Patient 50 - 74% ?  ?  ?Transfers ?Chair/bed transfer ? ?Transfers assist ?   ? ?Chair/bed transfer assist level: Minimal Assistance - Patient > 75% ?  ?  ?Locomotion ?Ambulation ? ? ?Ambulation assist ? ?   ? ?Assist level: Minimal Assistance - Patient > 75% ?Assistive device: Walker-rolling ?Max distance: 150 ft  ? ?Walk 10 feet activity ? ? ?Assist ?   ? ?Assist level: Minimal Assistance - Patient > 75% ?Assistive device: Walker-rolling  ? ?Walk 50 feet  activity ? ? ?Assist   ? ?Assist level: Minimal Assistance - Patient > 75% ?Assistive device: Walker-rolling  ? ? ?Walk 150 feet activity ? ? ?Assist   ? ?Assist level: Minimal Assistance - Patient > 75% ?Assistive device: Walker-rolling ?  ? ?Walk 10 feet on uneven surface  ?activity ? ? ?Assist Walk 10 feet on uneven surfaces activity did not occur: Safety/medical concerns ? ? ?  ?   ? ?Wheelchair ? ? ? ? ?Assist Is the patient using a wheelchair?: No ?Type of Wheelchair: Manual ?  ? ?  ?   ? ? ?Wheelchair 50 feet with 2 turns activity ? ? ? ?Assist ? ?  ?  ? ? ?   ? ?Wheelchair 150 feet activity  ? ? ? ?Assist ?   ? ? ?   ? ?Blood pressure  117/66, pulse 80, temperature 99.3 ?F (37.4 ?C), temperature source Oral, resp. rate 18, height 5\' 1"  (1.549 m), weight 41.9 kg, SpO2 98 %. ? ?Medical Problem List and Plan: ?1. 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            -patient may shower ?            -ELOS/Goals: 4/7 with sup/CGA ? -Continue CIR therapies including PT, OT, and SLP  ? ?2.  Antithrombotics: ?-DVT/anticoagulation:  Pharmaceutical: Lovenox ?            -antiplatelet therapy: Aspirin 325 mg daily and Plavix and 5 mg day x90 days ending 01/07/2022 then monotherapy with aspirin 81 mg thereafter ?3. Pain Management: Tylenol as needed ?4. Mood: Lexapro 20 mg daily.  Psychiatry services consulted ?            -antipsychotic agents: N/A ?5. Neuropsych: This patient he is capable of making decisions on his own behalf. ?6. Skin/Wound Care: Routine skin checks ?7. Fluids/Electrolytes/Nutrition: Routine in and out with follow-up chemistries ?8.  Hypothyroidism.  Synthroid ?9.  Hyperlipidemia.  Continue Crestor ?10.  HTN  ?Vitals:  ? 12/03/21 0949 12/03/21 1331  ?BP: 113/68 117/66  ?Pulse: 85 80  ?Resp:  18  ?Temp:  99.3 ?F (37.4 ?C)  ?SpO2:  98%  ?Controlled 4/3 ? ?11.  History of alcohol/tobacco abuse.  Now in remission.  Patient will receive counseling ?12.  COVID-19 positive.  Completed course of Paxlovid and airborne precautions discontinued 3/26 ?13. ? UTI.  Urinalysis positive nitrite.  COntiminated specimen UC, UA was   ? -4/3 completed keflex ?     -recurrent urgency/frequency ?    -re-collect urine for analysis and culture ?14. Underweight: BMI 17.45: provide dietary education. Pt c/o poor appetite, add Megace now taking===eating 100% of meals  ?  ? ?LOS: ?7 days ?A FACE TO FACE EVALUATION WAS PERFORMED ? ?6/3 ?12/03/2021, 1:43 PM  ? ? ? ?

## 2021-12-03 NOTE — Progress Notes (Signed)
Occupational Therapy Session Note ? ?Patient Details  ?Name: Ralph Jackson ?MRN: [id number] ?Date of Birth: 1945-11-22 ? ?Today's Date: 12/03/2021 ?OT Individual Time: 4680-3212 ?OT Individual Time Calculation (min): 42 min  ? ? ?Short Term Goals: ?Week 1:  OT Short Term Goal 1 (Week 1): STG = LTG 2/2 ELOS ? ?Skilled Therapeutic Interventions/Progress Updates:  ?Pt greeted  seated in recliner agreeable to OT intervention. Session focus on RUE coordination, dynamic standing balance functional mobility. ?No family present for scheduled family ed session. Pt reports his daughter got a new job and could not make it today. Pt declined need for ADLS, pt completed stand pivot from recliner>w/c with RW and cGA. Pt transported to gym with total A. Utilized BITS to work on [employer] coordination with pt completing trail making task with RUE. Pt needed MOD verbal cues for sequencing as pt wanting to take finger off screen after connecting one number. Pt did need assist to locate # 15 on top R quadrant. Pt reports it was hardest to "find the numbers on the screen because I have never done it before." Also noted increased difficulty  keeping R index finger opposed.  ?Pt also worked on Academic librarian deficits by racing shapes with RUE, continued to noted increased difficulty with keeping R index finger opposed. Pt able to complete task with 80.1% coverage.  ?Graded task up and had pt work on dynamic standing balance with pt instructed to stand and reach for letters on screen in alphabetical order. Pt unable to problem solve locating "K" without MAX cues. Pt finished task with 18.9 % accuracy in 3 mins and 13 secs with a 12.8 sec reaction time. Most difficulty locating letters in bottom R quadrant.  ?Pt transported back to room with total  A where pt completed stand pivot back to recliner with Rw and CGA. Pt left seated in recliner with alarm belt activated and all needs within reach.  ? ?Therapy Documentation ?Precautions:   ?Precautions ?Precautions: Fall ?Precaution Comments: R hemi, Bil hip weakness, HOH ?Restrictions ?Weight Bearing Restrictions: Yes ? ?Pain: no pain reported ? ?Therapy/Group: Individual Therapy ? ?[name] ?12/03/2021, 12:23 PM ?

## 2021-12-03 NOTE — Progress Notes (Signed)
Occupational Therapy Session Note ? ?Patient Details  ?Name: Ralph Jackson ?MRN: [id number] ?Date of Birth: [birth year] ? ?Today's Date: 12/03/2021 ?OT Individual Time: 5041-3643 ?OT Individual Time Calculation (min): 30 min  ? ? ?Short Term Goals: ?Week 1:  OT Short Term Goal 1 (Week 1): STG = LTG 2/2 ELOS ? ?Skilled Therapeutic Interventions/Progress Updates:  ?  Pt received in recliner agreeable to therapy. Pt expressed a desire to get his R arm and leg stronger.  Had pt stand from recliner and ambulate to stand at closet door, pt placed R hand on small washcloth and slid cloth up door for wall slides. Pt able to get full AROM with support of the wall.  Pt tolerated standing for 5-6 minutes to work on sh exercise. ? ?Pt then ambulated to EOB to work on controlled sit to stands.  Focused on a forward lean and pushing up with his legs vs having 1 hand on walker.  Had pt hold a 3# dowel bar in both hands and cued him to lean forward to reach dowel to knees to rise to stand.  Pt did very well with this and was able to complete 10 reps of 3 sets with only close S to rise to stand and CGA to lower to sit.    In standing, pt able to hold his balance with close S as he raised bar up to 40 degrees of flexion  8 x.  Great participation.  Pt ambulated back to recliner with RW with CGA.  Pt sat in recliner with belt alarm on and all needs met.  ? ?Therapy Documentation ?Precautions:  ?Precautions ?Precautions: Fall ?Precaution Comments: R hemi, Bil hip weakness, HOH ?Restrictions ?Weight Bearing Restrictions: Yes ? ?  ?Vital Signs: ?Therapy Vitals ?Pulse Rate: 85 ?BP: 113/68 ?Pain: ?Pain Assessment ?Pain Scale: 0-10 ?Pain Score: 0-No pain ?ADL: ?ADL ?Eating: Set up ?Where Assessed-Eating: Chair ?Grooming: Minimal assistance ?Where Assessed-Grooming: Standing at sink ?Upper Body Bathing: Supervision/safety ?Where Assessed-Upper Body Bathing: Edge of bed ?Lower Body Bathing: Minimal assistance ?Where Assessed-Lower Body  Bathing: Edge of bed ?Upper Body Dressing: Supervision/safety ?Where Assessed-Upper Body Dressing: Edge of bed ?Lower Body Dressing: Moderate assistance ?Where Assessed-Lower Body Dressing: Edge of bed ?Toileting: Moderate assistance ?Where Assessed-Toileting: Toilet ?Toilet Transfer: Minimal assistance ?Toilet Transfer Method: Ambulating ?Science writer: Grab bars ?Tub/Shower Transfer: Not assessed ?Walk-In Shower Transfer: Not assessed ?  ? ? ?Therapy/Group: Individual Therapy ? ?[place] ?12/03/2021, 1:14 PM ?

## 2021-12-04 LAB — URINE CULTURE: Culture: <10000 — AB

## 2021-12-04 MED ORDER — MUSCLE RUB 10-15 % EX CREA
TOPICAL_CREAM | CUTANEOUS | Status: DC | PRN
  Filled 2021-12-04: qty 85

## 2021-12-04 NOTE — Plan of Care (Signed)
?  Problem: RH Furniture Transfers ?Goal: LTG Patient will perform furniture transfers w/assist (OT/PT) ?Description: LTG: Patient will perform furniture transfers  with assistance (OT/PT). ?Flowsheets (Taken 12/04/2021 1111) ?LTG: Pt will perform furniture transfers with assist:: Contact Guard/Touching assist ?Note: Downgraded due to slower than anticipated progress ?  ?Problem: RH Balance ?Goal: LTG Patient will maintain dynamic standing with ADLs (OT) ?Description: LTG:  Patient will maintain dynamic standing balance with assist during activities of daily living (OT)  ?Flowsheets (Taken 12/04/2021 1111) ?LTG: Pt will maintain dynamic standing balance during ADLs with: Contact Guard/Touching assist ?Note: Downgraded due to slower than anticipated progress ?  ?Problem: RH Bathing ?Goal: LTG Patient will bathe all body parts with assist levels (OT) ?Description: LTG: Patient will bathe all body parts with assist levels (OT) ?Flowsheets (Taken 12/04/2021 1111) ?LTG: Pt will perform bathing with assistance level/cueing: Contact Guard/Touching assist ?Note: Downgraded due to slower than anticipated progress ?  ?Problem: RH Toileting ?Goal: LTG Patient will perform toileting task (3/3 steps) with assistance level (OT) ?Description: LTG: Patient will perform toileting task (3/3 steps) with assistance level (OT)  ?Flowsheets (Taken 12/04/2021 1111) ?LTG: Pt will perform toileting task (3/3 steps) with assistance level: Minimal Assistance - Patient > 75% ?Note: Downgraded due to slower than anticipated progress ?  ?Problem: RH Toilet Transfers ?Goal: LTG Patient will perform toilet transfers w/assist (OT) ?Description: LTG: Patient will perform toilet transfers with assist, with/without cues using equipment (OT) ?Flowsheets (Taken 12/04/2021 1111) ?LTG: Pt will perform toilet transfers with assistance level of: Contact Guard/Touching assist ?Note: Downgraded due to slower than anticipated progress ?  ?Problem: RH Tub/Shower  Transfers ?Goal: LTG Patient will perform tub/shower transfers w/assist (OT) ?Description: LTG: Patient will perform tub/shower transfers with assist, with/without cues using equipment (OT) ?Flowsheets (Taken 12/04/2021 1111) ?LTG: Pt will perform tub/shower stall transfers with assistance level of: Contact Guard/Touching assist ?Note: Downgraded due to slower than anticipated progress ?  ?

## 2021-12-04 NOTE — Progress Notes (Signed)
Patient ID: Ralph Jackson, male   DOB: 08-Mar-1946, 76 y.o.   MRN: [id number] ? ?Wheelchair, Conservation officer, nature and tub bench ordered through [employer]  ?

## 2021-12-04 NOTE — Progress Notes (Signed)
Physical Therapy Session Note ? ?Patient Details  ?Name: Ralph Jackson ?MRN: [id number] ?Date of Birth: [birth year] ? ?Today's Date: 12/04/2021 ?PT Individual Time: 5916-3846 ?PT Individual Time Calculation (min): 19 min  and Today's Date: 12/04/2021 ?PT Missed Time: 41 Minutes ?Missed Time Reason: Pain;Patient fatigue (c/o neck pain) ? ?Short Term Goals: ?Week 1:  PT Short Term Goal 1 (Week 1): Pt will perform bed mobility with consistent supervision and no use of bed features. ?PT Short Term Goal 2 (Week 1): Pt will perform all functional standing transfers with supervision and safe technique. ?PT Short Term Goal 3 (Week 1): Pt will ambulate with improved R trendelenberg using RW with close supervision. ?PT Short Term Goal 4 (Week 1): Pt will complete at least 4 steps with RHR only with consistent MinA. ?Week 2:    ? ?Skilled Therapeutic Interventions/Progress Updates:  ?On entrance to room, pt supine in bed and c/o neck pain with reduced ability to turn head to L side. Pt states that he "may have slept on it wrong" and called it a "crick" in his neck. Pt relates that he does "not want to participate in therapy just yet" d/t neck pain. Provided pt with hot pack to neck and explained that therapist would return in to check on pt. ? ?On return, pt asleep and comfortable. Hot pack repositioned on neck and pt left to sleep in order to participate in upcoming therapy sessions with OT.  ? ?Pt asleep in bed with bed alarm on and all needs in reach.  ? ?Pt missed 41 min of skilled therapy due to neck pain and fatigue. Will re-attempt as schedule and pt availability permits. ? ? ?Therapy Documentation ?Precautions:  ?Precautions ?Precautions: Fall ?Precaution Comments: R hemi, Bil hip weakness, HOH ?Restrictions ?Weight Bearing Restrictions: Yes ?General: ?PT Amount of Missed Time (min): 41 Minutes ?PT Missed Treatment Reason: Pain;Patient fatigue (c/o neck pain) ?Vital Signs: ?  ?Pain: ?Pt c/o neck pain on entrance  to room. Provided with hot pack to indicated area of L lateral neck.  ? ?Therapy/Group: Individual Therapy ? ?Loel Dubonnet PT, DPT ?12/04/2021, 10:24 AM  ?

## 2021-12-04 NOTE — Progress Notes (Signed)
Occupational Therapy Session Note ? ?Patient Details  ?Name: Ralph Jackson ?MRN: [id number] ?Date of Birth: [birth year] ? ?Today's Date: 12/04/2021 ?OT Individual Time: 7614-7092 ?OT Individual Time Calculation (min): 68 min + 59 min ? ? ?Short Term Goals: ?Week 1:  OT Short Term Goal 1 (Week 1): STG = LTG 2/2 ELOS ? ?Skilled Therapeutic Interventions/Progress Updates:  ?  Session 1 [id number]): Pt received seated in recliner, reports L neck pain but had just received cream for pain relief, agreeable to therapy. Session focus on activity tolerance, community reintegration, dynamic standing balance, RUE NMR in prep for improved ADL/IADL/func mobility performance + decreased caregiver burden. Pt declined shower and need for ADL. Short ambulatory transfer CGA > w/c, total A w/c transport to and from outdoor patio area for change in scenery/ support psychosocial health.  ? ?Pt participated in the following therex with medium soft theraputty: rolling into log shape, pinch and pull, digit extension, rolling into ball shape. Required increased time to complete tasks thoroughly.  ? ?Completed 2x12 of the following with 4.4 lb medicine ball: overhead and cross body reaches, chest press with use of BUE.  ? ?Practiced ambulating outside and in busy gift shop environment with overall CGA to min A, cues to stay inside RW frame and to take larger steps on the R. Pt able to find 3/3 shopping list items with overall max cuing to locate items in general area of shop.  ? ?Pt left seated in recliner with safety belt alarm engaged, call bell in reach, and all immediate needs met.  ? ? ?Session 2 [phone]): Pt received seated in recliner, reports improved neck pain, agreeable to therapy. Session focus on self-care retraining, activity tolerance, transfer retraining, RUE/RLE NMR/strengthening  in prep for improved ADL/IADL/func mobility performance + decreased caregiver burden. Changed shirt with set-up A due to having spilled lunch  on it with poor awareness. Short ambulatory toilet transfer x2 throughout session with light min A to manage RW into turn, CGA for toileting tasks post continent void of bladder. Brushed hair with S while seated.  ? ?Total A w/c transport to and from gym.  ? ?Completed 2x10 sit to stands with LUE on 1.5 then 3.5 inch step to force use of RLE  ?+ RW. Massed practice of toe taps with BLE, CGA to min A for balance throughout.  ? ?Finally, practiced retrieving resistive clothes pins from behind him on the mat and to place on horizontal bar outside of BOS, able to grasp clothes pins levels 1 - 4 with palmar grasp. And CGA for balance throughout with use of RW. ? ?Pt left semi-reclined in bed with bed alarm engaged, call bell in reach, and all immediate needs met.  ? ? ?Therapy Documentation ?Precautions:  ?Precautions ?Precautions: Fall ?Precaution Comments: R hemi, Bil hip weakness, HOH ?Restrictions ?Weight Bearing Restrictions: Yes ? ?Pain: see session notes ?  ?ADL: See Care Tool for more details. ? ? ?Therapy/Group: Individual Therapy ? ?Volanda Napoleon MS, OTR/L ? ?12/04/2021, 6:53 AM ?

## 2021-12-04 NOTE — Progress Notes (Signed)
?                                                       PROGRESS NOTE ? ? ?Subjective/Complaints: ?C/o left sided neck pain , no radiation to arm, no CP, feels like a "crick" ? ?ROS: Patient denies CP, SOB, N/V/D ? ?Objective: ?  ?No results found. ?No results for input(s): WBC, HGB, HCT, PLT in the last 72 hours. ? ?Recent Labs  ?  12/03/21 ?7353  ?CREATININE 0.78  ? ? ? ?Intake/Output Summary (Last 24 hours) at 12/04/2021 0751 ?Last data filed at 12/04/2021 2992 ?Gross per 24 hour  ?Intake 720 ml  ?Output 900 ml  ?Net -180 ml  ? ?  ? ?  ? ?Physical Exam: ?Vital Signs ?Blood pressure 110/61, pulse 62, temperature 98 ?F (36.7 ?C), resp. rate 16, height 5\' 1"  (1.549 m), weight 41.9 kg, SpO2 100 %. ? ? ? ?General: No acute distress ?Mood and affect are appropriate ?Heart: Regular rate and rhythm no rubs murmurs or extra sounds ?Lungs: Clear to auscultation, breathing unlabored, no rales or wheezes ?Abdomen: Positive bowel sounds, soft nontender to palpation, nondistended ?Extremities: No clubbing, cyanosis, or edema ? ? ?Skin: No evidence of breakdown, no evidence of rash ?Neurologic: Cranial nerves II through XII intact, motor strength is 5/5 in left 4+ right  deltoid, bicep, tricep, grip, hip flexor, knee extensors, ankle dorsiflexor and plantar flexor ?Sensory exam normal for light touch and pain in all 4 limbs. No limb ataxia or cerebellar signs. No abnormal tone appreciated.   ?Musculoskeletal: Full range of motion in all 4 extremities. Tenderness Left trap, no pain with C spine ROM  ? ? ?Assessment/Plan: ?1. Functional deficits which require 3+ hours per day of interdisciplinary therapy in a comprehensive inpatient rehab setting. ?Physiatrist is providing close team supervision and 24 hour management of active medical problems listed below. ?Physiatrist and rehab team continue to assess barriers to discharge/monitor patient progress toward functional and medical goals ? ?Care Tool: ? ?Bathing ?   ?Body parts bathed  by patient: Right arm, Left arm, Chest, Face  ?   ?  ?  ?Bathing assist Assist Level: Supervision/Verbal cueing ?  ?  ?Upper Body Dressing/Undressing ?Upper body dressing   ?What is the patient wearing?: Pull over shirt ?   ?Upper body assist Assist Level: Supervision/Verbal cueing ?   ?Lower Body Dressing/Undressing ?Lower body dressing ? ? ?   ?What is the patient wearing?: Pants ? ?  ? ?Lower body assist Assist for lower body dressing: Contact Guard/Touching assist ?   ? ?Toileting ?Toileting Toileting Activity did not occur and hygiene only):  (condom cath)  ?Toileting assist Assist for toileting: Moderate Assistance - Patient 50 - 74% ?  ?  ?Transfers ?Chair/bed transfer ? ?Transfers assist ?   ? ?Chair/bed transfer assist level: Minimal Assistance - Patient > 75% ?  ?  ?Locomotion ?Ambulation ? ? ?Ambulation assist ? ?   ? ?Assist level: Minimal Assistance - Patient > 75% ?Assistive device: Walker-rolling ?Max distance: 150 ft  ? ?Walk 10 feet activity ? ? ?Assist ?   ? ?Assist level: Minimal Assistance - Patient > 75% ?Assistive device: Walker-rolling  ? ?Walk 50 feet activity ? ? ?Assist   ? ?Assist level: Minimal Assistance - Patient > 75% ?Assistive device:  Walker-rolling  ? ? ?Walk 150 feet activity ? ? ?Assist   ? ?Assist level: Minimal Assistance - Patient > 75% ?Assistive device: Walker-rolling ?  ? ?Walk 10 feet on uneven surface  ?activity ? ? ?Assist Walk 10 feet on uneven surfaces activity did not occur: Safety/medical concerns ? ? ?  ?   ? ?Wheelchair ? ? ? ? ?Assist Is the patient using a wheelchair?: No ?Type of Wheelchair: Manual ?  ? ?  ?   ? ? ?Wheelchair 50 feet with 2 turns activity ? ? ? ?Assist ? ?  ?  ? ? ?   ? ?Wheelchair 150 feet activity  ? ? ? ?Assist ?   ? ? ?   ? ?Blood pressure 110/61, pulse 62, temperature 98 ?F (36.7 ?C), resp. rate 16, height 5\' 1"  (1.549 m), weight 41.9 kg, SpO2 100 %. ? ?Medical Problem List and Plan: ?1. 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            -patient may shower ?            -ELOS/Goals: 4/7 with sup/CGA ? -Continue CIR therapies including PT, OT, and SLP - team conf in am  ? ?2.  Antithrombotics: ?-DVT/anticoagulation:  Pharmaceutical: Lovenox ?            -antiplatelet therapy: Aspirin 325 mg daily and Plavix and 5 mg day x90 days ending 01/07/2022 then monotherapy with aspirin 81 mg thereafter ?3. Pain Management: Tylenol as needed ?Left trap pain, bengay, heat ordered  ?4. Mood: Lexapro 20 mg daily.  Psychiatry services consulted ?            -antipsychotic agents: N/A ?5. Neuropsych: This patient he is capable of making decisions on his own behalf. ?6. Skin/Wound Care: Routine skin checks ?7. Fluids/Electrolytes/Nutrition: Routine in and out with follow-up chemistries ?8.  Hypothyroidism.  Synthroid ?9.  Hyperlipidemia.  Continue Crestor ?10.  HTN  ?Vitals:  ? 12/03/21 1956 12/04/21 0454  ?BP: 115/66 110/61  ?Pulse: 73 62  ?Resp: 20 16  ?Temp: 98.4 ?F (36.9 ?C) 98 ?F (36.7 ?C)  ?SpO2: 99% 100%  ?Controlled 4/4 ? ?11.  History of alcohol/tobacco abuse.  Now in remission.  Patient will receive counseling ?12.  COVID-19 positive.  Completed course of Paxlovid and airborne precautions discontinued 3/26 ?13. ? UTI.  Urinalysis positive nitrite.  COntiminated specimen UC, UA was   ? -4/3 completed keflex ?     -recurrent urgency/frequency ?    -repeat UA neg, likely CVA related  ?14. Underweight: BMI 17.45: provide dietary education. Pt c/o poor appetite, add Megace now eating 100% of meals  ?  ? ?LOS: ?8 days ?A FACE TO FACE EVALUATION WAS PERFORMED ? ?6/3 Derya Dettmann ?12/04/2021, 7:51 AM  ? ? ? ?

## 2021-12-04 NOTE — Discharge Summary (Signed)
Physician Discharge Summary  ?Patient ID: ?Ralph Jackson ?MRN: [id number] ?DOB/AGE: [birth year] 76 y.o. ? ?Admit date: 11/26/2021 ?Discharge date: 12/13/2021 ? ?Discharge Diagnoses:  ?Principal Problem: ?  Subcortical infarction [facility]) ?DVT prophylaxis ?Hypothyroidism ?Hyperlipidemia ?Hypertension ?History of tobacco alcohol use ?COVID-19 positive ?Mood stabilization ? ?Discharged Condition: Stable ? ?Significant Diagnostic Studies: ?CT Angio Head W or Wo Contrast ? ?Result Date: 11/15/2021 ?CLINICAL DATA:  Stroke, follow-up.  Weakness and difficulty eating. EXAM: CT ANGIOGRAPHY HEAD AND NECK TECHNIQUE: Multidetector CT imaging of the head and neck was performed using the standard protocol during bolus administration of intravenous contrast. Multiplanar CT image reconstructions and MIPs were obtained to evaluate the vascular anatomy. Carotid stenosis measurements (when applicable) are obtained utilizing NASCET criteria, using the distal internal carotid diameter as the denominator. RADIATION DOSE REDUCTION: This exam was performed according to the departmental dose-optimization program which includes automated exposure control, adjustment of the mA and/or kV according to patient size and/or use of iterative reconstruction technique. CONTRAST:  82mL OMNIPAQUE IOHEXOL 350 MG/ML SOLN COMPARISON:  Head CT same day.  Cervical spine CT 12/10/2015 FINDINGS: CTA NECK FINDINGS Aortic arch: Aortic atherosclerosis. Branching pattern is normal without origin stenosis. Right carotid system: Common carotid artery widely patent to the bifurcation. Soft and calcified plaque at the carotid bifurcation and ICA bulb but no stenosis compared to the more distal cervical ICA diameter. Left carotid system: Common carotid artery widely patent to the bifurcation. Minimal calcified plaque at the ICA bulb but no stenosis. Vertebral arteries: Both vertebral artery origins are widely patent. Both vertebral arteries appear normal through the  cervical region to the foramen magnum. Skeleton: No significant cervical degenerative disease or other finding. Other neck: No mass or lymphadenopathy. Upper chest: Chronic scarring at the left apex, unchanged since 2017 and therefore benign. Review of the MIP images confirms the above findings CTA HEAD FINDINGS Anterior circulation: Both internal carotid arteries are patent through the skull base and siphon region. Ordinary siphon atherosclerosis but without stenosis greater than 30%. The anterior and middle cerebral vessels are patent without proximal stenosis, aneurysm or vascular malformation. The patient does show severe serial stenoses within the left anterior cerebral artery. There is also focal stenosis at the origin of the right M2 inferior division. Posterior circulation: Both vertebral arteries are patent through the foramen magnum to the basilar. No basilar stenosis. Posterior circulation branch vessels show distal vessel atherosclerotic change, including a severe stenosis at the left P2 region. Venous sinuses: Patent and normal. Anatomic variants: None significant. Review of the MIP images confirms the above findings IMPRESSION: Atherosclerotic disease at both carotid bifurcations but no stenosis. Atherosclerotic disease in both carotid siphon regions but no stenosis greater than 30%. 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 Focal stenosis at the proximal right MCA inferior division. Focal stenosis in the left PCA at the P2 level. Electronically Signed   By: Paulina Fusi M.D.   On: 11/15/2021 15:53  ? ?CT Head Wo Contrast ? ?Result Date: 11/15/2021 ?CLINICAL DATA:  Neuro deficit, acute, stroke suspected; right sided arm and leg weakness x 4 days EXAM: CT HEAD WITHOUT CONTRAST TECHNIQUE: Contiguous axial images were obtained from the base of the skull through the vertex without intravenous contrast. RADIATION DOSE REDUCTION: This exam was performed according to the departmental  dose-optimization program which includes automated exposure control, adjustment of the mA and/or kV according to patient size and/or use of iterative reconstruction technique. COMPARISON:  2017 FINDINGS: Brain: There is no acute intracranial  hemorrhage. Small areas of hypoattenuation with loss of gray-white differentiation in the left frontal lobe involving the superior gyrus extending into the centrum semiovale. Chronic infarct of the left basal ganglia and adjacent white matter. Additional patchy and confluent areas of low-density in the supratentorial white matter probably reflect chronic microvascular ischemic changes. Prominence of the ventricles and sulci reflects parenchymal volume loss. No significant mass effect. No extra-axial collection. Vascular: No hyperdense vessel.There is atherosclerotic calcification at the skull base. Skull: Calvarium is unremarkable. Sinuses/Orbits: Retained secretions right sphenoid. Left sphenoid retention cyst. No acute abnormality of the orbits. Other: Mastoid air cells are clear. IMPRESSION: Small cortical/subcortical left frontal infarcts are likely acute to subacute. Chronic infarcts and chronic microvascular ischemic changes. Electronically Signed   By: Guadlupe Spanish M.D.   On: 11/15/2021 12:42  ? ?CT Angio Neck W and/or Wo Contrast ? ?Result Date: 11/15/2021 ?CLINICAL DATA:  Stroke, follow-up.  Weakness and difficulty eating. EXAM: CT ANGIOGRAPHY HEAD AND NECK TECHNIQUE: Multidetector CT imaging of the head and neck was performed using the standard protocol during bolus administration of intravenous contrast. Multiplanar CT image reconstructions and MIPs were obtained to evaluate the vascular anatomy. Carotid stenosis measurements (when applicable) are obtained utilizing NASCET criteria, using the distal internal carotid diameter as the denominator. RADIATION DOSE REDUCTION: This exam was performed according to the departmental dose-optimization program which includes  automated exposure control, adjustment of the mA and/or kV according to patient size and/or use of iterative reconstruction technique. CONTRAST:  42mL OMNIPAQUE IOHEXOL 350 MG/ML SOLN COMPARISON:  Head CT same day.  Cervical spine CT 12/10/2015 FINDINGS: CTA NECK FINDINGS Aortic arch: Aortic atherosclerosis. Branching pattern is normal without origin stenosis. Right carotid system: Common carotid artery widely patent to the bifurcation. Soft and calcified plaque at the carotid bifurcation and ICA bulb but no stenosis compared to the more distal cervical ICA diameter. Left carotid system: Common carotid artery widely patent to the bifurcation. Minimal calcified plaque at the ICA bulb but no stenosis. Vertebral arteries: Both vertebral artery origins are widely patent. Both vertebral arteries appear normal through the cervical region to the foramen magnum. Skeleton: No significant cervical degenerative disease or other finding. Other neck: No mass or lymphadenopathy. Upper chest: Chronic scarring at the left apex, unchanged since 2017 and therefore benign. Review of the MIP images confirms the above findings CTA HEAD FINDINGS Anterior circulation: Both internal carotid arteries are patent through the skull base and siphon region. Ordinary siphon atherosclerosis but without stenosis greater than 30%. The anterior and middle cerebral vessels are patent without proximal stenosis, aneurysm or vascular malformation. The patient does show severe serial stenoses within the left anterior cerebral artery. There is also focal stenosis at the origin of the right M2 inferior division. Posterior circulation: Both vertebral arteries are patent through the foramen magnum to the basilar. No basilar stenosis. Posterior circulation branch vessels show distal vessel atherosclerotic change, including a severe stenosis at the left P2 region. Venous sinuses: Patent and normal. Anatomic variants: None significant. Review of the MIP images  confirms the above findings IMPRESSION: Atherosclerotic disease at both carotid bifurcations but no stenosis. Atherosclerotic disease in both carotid siphon regions but no stenosis greater than 30%. Serial severe s

## 2021-12-04 NOTE — Progress Notes (Signed)
Physical Therapy Session Note ? ?Patient Details  ?Name: Ralph Jackson ?MRN: [id number] ?Date of Birth: June 30, 1946 ? ?Today's Date: 12/03/2021 ?PT Individual Time:  6606-3016  ?PT Individual Time Calculation (min): 65 min ? ?Short Term Goals: ?Week 1:  PT Short Term Goal 1 (Week 1): Pt will perform bed mobility with consistent supervision and no use of bed features. ?PT Short Term Goal 2 (Week 1): Pt will perform all functional standing transfers with supervision and safe technique. ?PT Short Term Goal 3 (Week 1): Pt will ambulate with improved R trendelenberg using RW with close supervision. ?PT Short Term Goal 4 (Week 1): Pt will complete at least 4 steps with RHR only with consistent MinA. ? ? ?Skilled Therapeutic Interventions/Progress Updates:  ?Patient supine in bed on entrance to room. Patient alert and agreeable to PT session. Pt relates that dtr has decided not to come for family education. ? ?Patient with no pain complaint throughout session. ? ?Has condom catheter attached and requires Max assist for removal.  ? ?Therapeutic Activity: ?Bed Mobility: Patient performed supine <> sit with supervision/ Mod I. No vc provided for technique. ?Transfers: Patient performed sit<>stand and stand pivot transfers throughout session with supervision. Provided continued verbal cues throughout session for completing pivot turn to seat using RW and reaching back for armrest of w/c to assist with controlled descent to sit.  ? ?Gait Training:  ?Relates that he would like to push w/c for support and have therapist follow behind him. Allowed trial with CGA and allowed pt to fail for increased insight into balance during gait. Patient ambulated 120 ft using RW with CGA. Initially demonstrated continues hip hike and circumduction for RLE advancement requiring continued cueing for increased hip knee flexion toward yellow tband at top of walker as well as for foot placement in stepping toward yellow coband at right anterior  walker leg. Continued trendelenberg gait pattern throughout. ? ?Stair training using R sided handrail only requiring overall CGA and light MinA for improved foot placement with first 2 steps for instruction. Modified lateral stepping technique for descent with one HR.  ? ?Neuromuscular Re-ed: ?NMR facilitated during session with focus on standing balance. Pt guided in blocked practice of sit<>stands with no AD. Improves from CGA to supervision. NMR performed for improvements in motor control and coordination, balance, sequencing, judgement, and self confidence/ efficacy in performing all aspects of mobility at highest level of independence.  ? ?Patient seated  in ecliner at end of session with brakes locked, belt alarm set, and all needs within reach. ? ? ?Therapy Documentation ?Precautions:  ?Precautions ?Precautions: Fall ?Precaution Comments: R hemi, Bil hip weakness, HOH ?Restrictions ?Weight Bearing Restrictions: Yes ?General: ?  ?Vital Signs: ?  ?Pain: ? No pain complaint this session. ? ?Therapy/Group: Individual Therapy ? ?Loel Dubonnet PT, DPT ?12/03/2021, 4:54 PM  ?

## 2021-12-05 NOTE — Progress Notes (Addendum)
Patient ID: Ralph Jackson, male   DOB: Jan 30, 1946, 76 y.o.   MRN: [id number] ?Team Conference Report to Patient/Family ? ?Team Conference discussion was reviewed with the patient and caregiver, including goals, any changes in plan of care and target discharge date.  Patient and caregiver express understanding and are in agreement.  The patient has a target discharge date of 12/07/21. ? ?Sw spoke with patient daughter and provided team conference updates. Daughter or son in law will be able to transport the patient to OP appointments. The patient daughter will arrive after 5PM for patient d/c. ? ?Andria Rhein ?12/05/2021, 2:32 PM  ? ? ? ? ?

## 2021-12-05 NOTE — Progress Notes (Signed)
Physical Therapy Session Note ? ?Patient Details  ?Name: Ralph Jackson ?MRN: [id number] ?Date of Birth: [birth year] ? ?Today's Date: 12/05/2021 ?PT Individual Time: [id number] ?PT Individual Time Calculation (min): 25 min  ? ?Short Term Goals: ?Week 1:  PT Short Term Goal 1 (Week 1): Pt will perform bed mobility with consistent supervision and no use of bed features. ?PT Short Term Goal 2 (Week 1): Pt will perform all functional standing transfers with supervision and safe technique. ?PT Short Term Goal 3 (Week 1): Pt will ambulate with improved R trendelenberg using RW with close supervision. ?PT Short Term Goal 4 (Week 1): Pt will complete at least 4 steps with RHR only with consistent MinA. ?Week 2:    ? ?Skilled Therapeutic Interventions/Progress Updates:  ?  Patient received sitting up in recliner, handoff from previous PT. He denies pain. Patient requesting to remain in room/do "a little therapy" since he recently finished previous session. Seated hip abduction with yellow theraband, standing hip abd B with B UE on RW. Emphasis on R lateral hip stability during stance. Patient ambulating ~155ft with RW and CGA + verbal cues for proximity to walker, R lateral hip stability in stance and longer R step length. Patient returning to recliner, requesting coffee (PT providing). Seatbelt alarm on, call light within reach.  ? ?Therapy Documentation ?Precautions:  ?Precautions ?Precautions: Fall ?Precaution Comments: R hemi, Bil hip weakness, HOH ?Restrictions ?Weight Bearing Restrictions: Yes ? ? ? ?Therapy/Group: Individual Therapy ? ?Debbora Dus ?Debbora Dus, PT, DPT, CBIS ? ?12/05/2021, 7:45 AM  ?

## 2021-12-05 NOTE — Progress Notes (Signed)
Occupational Therapy Weekly Progress Note ? ?Patient Details  ?Name: Ralph Jackson ?MRN: [id number] ?Date of Birth: [birth year] ? ?Beginning of progress report period: November 27, 2021 ?End of progress report period: December 05, 2021 ? ?Today's Date: 12/05/2021 ?OT Individual Time: [id number] ?OT Individual Time Calculation (min): 59 min  ? ? ?No STG initially set due shorter initial ELOS. Pt has made steady progress this week towards LTG. Pt has demonstrated improved functional use and strength of RUE, RLE, dynamic standing balance, ambulatory transfers with RW to presently complete bathroom transfers with CGA level, UBD at set-up A, toileting tasks with CGA to min A level, and LBD at S to min A. Pt cont to be primarily limited by RUE/RLE mild HP and baseline cognitive deficits, needs extensive encouragement to perform bathing tasks / self-care tasks for thoroughness, suspect this may be baseline for pt. RUE and hand currently assessed at Brummstrom level V and VI respectively. Anticipate 24/7 S and CGA  physical assist required upon DC. Daughter not present for in person scheduled family ed. Did downgrade bathroom transfers goals to CGA and toileting to min A due to poor safety awareness/RW management at times. ? ? ?Patient continues to demonstrate the following deficits: muscle weakness, decreased cardiorespiratoy endurance, impaired timing and sequencing, unbalanced muscle activation, decreased coordination, and decreased motor planning, decreased awareness, decreased safety awareness, and decreased memory, and decreased standing balance and R HP  and therefore will continue to benefit from skilled OT intervention to enhance overall performance with BADL and Reduce care partner burden. ? ?Patient progressing toward long term goals..  Continue plan of care. ? ?OT Short Term Goals ?Week 1:  OT Short Term Goal 1 (Week 1): STG = LTG 2/2 ELOS ?Week 2:  OT Short Term Goal 1 (Week 2): STG = LTG 2/2 ELOS ? ?Skilled  Therapeutic Interventions/Progress Updates:  ?  Pt received seated in recliner, no c/o pain, agreeable to therapy. Session focus on self-care retraining, activity tolerance, RUE NMR in prep for improved ADL/IADL/func mobility performance + decreased caregiver burden. Ambualtory toilet transfer CGA, improved RW management this date. Close S for clothing management, continent void of BM. Light min A to for pericare thoroughness, much improved since previous sessinos and pt even notes improvement to use his RUE to cleanse himself. Min A to thread RLE through pants, otherwise donned new brief / pants close S. Old brief saturated with urine and pt aware. Changed out soiled shirt mod I while seated. Bathed UB set-up A while seated, anterior periarea set-up A, as well. Declined shower. Brushed hair and placed hearing aid with S / increased time. ? ?Pt able to use BUE to create holiday craft after visual demonstration, able to fold / roll cloth, place rubber band, and tie ribbon with R. ? ?Issued printed UE HEP ( biceps curls, overhead reach, shoulder abduction with theraband + putty therex), will review in future sessions. ? ? ?Pt left seated in recliner with safety belt alarm engaged, call bell in reach, and all immediate needs met.  ? ? ?Therapy Documentation ?Precautions:  ?Precautions ?Precautions: Fall ?Precaution Comments: R hemi, Bil hip weakness, HOH ?Restrictions ?Weight Bearing Restrictions: Yes ? ?Pain: ?  No c/o throughout ?ADL: See Care Tool for more details. ? ?Therapy/Group: Individual Therapy ? ?Volanda Napoleon MS, OTR/L ? ?12/05/2021, 2:04 PM  ?

## 2021-12-05 NOTE — Progress Notes (Signed)
Physical Therapy Weekly Progress Note ? ?Patient Details  ?Name: Ralph Jackson ?MRN: [id number] ?Date of Birth: 02-12-1946 ? ?Beginning of progress report period: November 27, 2021 ?End of progress report period: December 05, 2021 ? ?Today's Date: 12/05/2021 ?PT Individual Time: 0806-0900 ?PT Individual Time Calculation (min): 54 min  ? ?Patient has met 4 of 4 short term goals.  Pt is making steady progress towards goals of supervision assist overall with RW. Pt currently requires CGA-supervision assist for gait and transfers due to noted hip weakness on the Rside resulting in decreased step length and trendelenburg gait pattern. Daughter not present for scheduled education earlier in week.  ? ?Patient continues to demonstrate the following deficits muscle weakness and muscle joint tightness, decreased cardiorespiratoy endurance, unbalanced muscle activation and decreased coordination, decreased attention to right, decreased attention, decreased awareness, decreased problem solving, decreased safety awareness, decreased memory, and delayed processing, and decreased standing balance, hemiplegia, and decreased balance strategies and therefore will continue to benefit from skilled PT intervention to increase functional independence with mobility. ? ?Patient progressing toward long term goals..  Continue plan of care. ? ?PT Short Term Goals ?Week 1:  PT Short Term Goal 1 (Week 1): Pt will perform bed mobility with consistent supervision and no use of bed features. ?PT Short Term Goal 1 - Progress (Week 1): Met ?PT Short Term Goal 2 (Week 1): Pt will perform all functional standing transfers with supervision and safe technique. ?PT Short Term Goal 2 - Progress (Week 1): Met ?PT Short Term Goal 3 (Week 1): Pt will ambulate with improved R trendelenberg using RW with close supervision. ?PT Short Term Goal 3 - Progress (Week 1): Met ?PT Short Term Goal 4 (Week 1): Pt will complete at least 4 steps with RHR only with consistent  MinA. ?PT Short Term Goal 4 - Progress (Week 1): Met ?Week 2:  PT Short Term Goal 1 (Week 2): STG=LTG due to ELOS ? ?Skilled Therapeutic Interventions/Progress Updates:  ? ?Pt received supine in bed and agreeable to PT. Supine>sit transfer without assist from PT.  Noted to have incontinent bladder. Lower body dressing change and hygiene with set up assist only from PT to don pants and wash groin.  ? ?Stand pivot transfers with and without AD throughout session and UE support on either RW or arm rest of chair with supervision assist to the R and L.  ? ?Stair managment training 3x 4steps with supervision assist.  And cues for step to gait pattern leading with LLE in ascent and descending with RLE.   ? ?Gait training x 121f with supervision assist and RW, cues for  improved posture, position in RW, and step length on the RLE to allow heel contact.  ? ?Dynamic balance training standing on airex pad to stacke 5 colored blocks x 5 with 1-0 UE support, supervision assist from PT for balance throughout and CGA to step on and off airex pad.  ? ?Patient returned to room and performed stand pivot to recliner with supervision. Pt left sitting in recliner with call bell in reach and all needs met.   ? ? ?   ? ?Therapy Documentation ?Precautions:  ?Precautions ?Precautions: Fall ?Precaution Comments: R hemi, Bil hip weakness, HOH ?Restrictions ?Weight Bearing Restrictions: Yes ? ?Vital Signs: ?Therapy Vitals ?Temp: 98.4 ?F (36.9 ?C) ?Temp Source: Oral ?Pulse Rate: 72 ?Resp: 19 ?BP: (!) 111/57 ?Patient Position (if appropriate): Lying ?Oxygen Therapy ?SpO2: 98 % ?O2 Device: Room Air ?Pain: ?denies ? ? ?Therapy/Group: Individual  Therapy ? ?Lorie Phenix ?12/05/2021, 9:05 AM  ?

## 2021-12-05 NOTE — Progress Notes (Signed)
Physical Therapy Session Note ? ?Patient Details  ?Name: Ralph Jackson ?MRN: [id number] ?Date of Birth: [birth year] ? ?Today's Date: 12/05/2021 ?PT Individual Time: [id number] ?PT Individual Time Calculation (min): 59 min  ? ?Short Term Goals: ?Week 1:  PT Short Term Goal 1 (Week 1): Pt will perform bed mobility with consistent supervision and no use of bed features. ?PT Short Term Goal 2 (Week 1): Pt will perform all functional standing transfers with supervision and safe technique. ?PT Short Term Goal 3 (Week 1): Pt will ambulate with improved R trendelenberg using RW with close supervision. ?PT Short Term Goal 4 (Week 1): Pt will complete at least 4 steps with RHR only with consistent MinA. ? ? ?Skilled Therapeutic Interventions/Progress Updates:  ?Patient seated upright in recliner with BLE elevated on entrance to room. Patient alert and agreeable to PT session.  ? ?Patient with no pain complaint throughout session. ? ?Therapeutic Activity: ?Bed Mobility: Patient performed sit-->supine with supervision. No cueing required for technique. ?Transfers: Patient performed sit<>stand and stand pivot transfers throughout session with supervision. Provided verbal cues for completing pivot turn prior to sit.  ?Toilet transfer performed with CGA/ supervision using RW. Pt performs pericare with distant supervision, requires time to complete clothing mgmt. ? ?Gait Training:  ?Patient ambulated 140 ft using RW with close supervision initially and CGA with increased fatigue and difficulty with RLE foot clearance. Provided vc/ tc for increasing hip/ knee flexion throughout. ? ?Pt guided in stair training with verbal instructions provided prior to performance. Pt is able to complete four 6" steps using BUE on RHR with CGA to ascend leading with LLE and MinA to descend leading with RLE. VC provided at initiation of each direction for leading LE as well as for clearing step with descent and decreasing scissoring in order to leave  room for LLE to step down.  ? ?Question sent to SW in re: pt's ownership of RW and w/c and whether or not delivered DME is required.  ? ?Patient supine  in bed at end of session with brakes locked, bed alarm set, and all needs within reach. ? ? ?Therapy Documentation ?Precautions:  ?Precautions ?Precautions: Fall ?Precaution Comments: R hemi, Bil hip weakness, HOH ?Restrictions ?Weight Bearing Restrictions: Yes ?General: ?  ?Vital Signs: ? ?Pain: ?Pain Assessment ?Pain Scale: 0-10 ?Pain Score: 0-No pain ? ?Therapy/Group: Individual Therapy ? ?Alger Simons PT, DPT ?12/05/2021, 12:55 PM  ?

## 2021-12-05 NOTE — Patient Care Conference (Signed)
Inpatient RehabilitationTeam Conference and Plan of Care Update ?Date: 12/05/2021   Time: 10:27 AM  ? ? ?Patient Name: Ralph Jackson      ?Medical Record Number: [id number]  ?Date of Birth: Jan 26, 1946 ?Sex: Male         ?Room/Bed: 5C16C/5C16C-01 ?Payor Info: Payor: Multimedia programmer / [facility] / Product Type: *No Product type* /   ? ?Admit Date/Time:  11/26/2021  2:52 PM ? ?Primary Diagnosis:  Subcortical infarction (HCC) ? ?Hospital Problems: Principal Problem: ?  Subcortical infarction [facility]) ? ? ? ?Expected Discharge Date: Expected Discharge Date: 12/07/21 ? ?Team Members Present: ?Physician leading conference: Dr. Claudette Laws ?Social Worker Present: Lavera Guise, BSW ?Nurse Present: Chana Bode, RN ?PT Present: Grier Rocher, PT ?OT Present: Annye English, OT ?SLP Present: Eilene Ghazi, SLP ?PPS Coordinator present : Fae Pippin, SLP ? ?   Current Status/Progress Goal Weekly Team Focus  ?Bowel/Bladder ? ? Patient has mixed incontinence, wears a brief for times of incontinence; was in continent of large amount of urine this shift stating "you didnt get here in time." Last BM 12/03/21.  maintain q4hr toileting at night  routine toileting, & prompt response to call light to prevent incontinence   ?Swallow/Nutrition/ Hydration ? ?           ?ADL's ? ? S UB ADL, S to CGA LBD, min toileting, CGA to min ambulatory toilet transfers with youth RW; mild RUE weakness, daughter not present for fam ed  CGA transfers (downgraded) 24/7 S  RUE NMR, transfer/balance/self-care retraining, pt/family AE/DME education, activity tolerance   ?Mobility ? ? Bed Mobility = supervision, Transfers = CGA/ supervision, ambulation = CGA for balance and RLE clearance, stairs = ModA  overall supervision  R NMR, R hip/ knee strengthening, standing balance, improved quality of gait, safety awareness, R attention, family education (dtr decided not to come for scheduled family educ this week and related not  wanting people in her her home - so no HH for pt)   ?Communication ? ?           ?Safety/Cognition/ Behavioral Observations ?           ?Pain ? ? denied pain when asked about pain level; accepted muscle rub application to neck when offered.  maintain comfort with meds, repostioning, & treatments  routine pain/comfort assessment qshift, & prn, with documentation, & interventions as needed.   ?Skin ? ? skin clean,dry, intact  maintain skin with hydration, nutrition, repositioning  assess skin routinely qshift, & prn, document any changes accordingly.   ? ? ?Discharge Planning:  ?Discharging home to daughter, daughter concerned about being able to provide 24/7   ?Team Discussion: ?Completed abx for UTI and appetite is better.  ?Patient on target to meet rehab goals: ?yes, currently needs set up for upper body care and supervision for lower body care at the edge of the bed. Needs CGA for toilet transfers with a RW and supervision overall for transfers due to short step and trendelenburg gait.  ? ?*See Care Plan and progress notes for long and short-term goals.  ? ?Revisions to Treatment Plan:  ?Downgraded OT goals ?SLP assessed; D3 diet and signed off services ?  ?Teaching Needs: ?Safety, medications, transfers, toileting, etc. ?  ?Current Barriers to Discharge: ?Decreased caregiver support, Home enviroment access/layout, and daughter declined education .  ?Dtr does not want HH because she did not want anyone in the house.  She will take him to outpt. ?Possible Resolutions to  Barriers: ?Recommend 24/7 supervision  ?HH follow up services changed to OP therapy ?DME: TTB, w/c and gel cushion ?  ? ? Medical Summary ?Current Status: No recent hx of ETOH, appetite improving ? Barriers to Discharge: Medical stability ?  ?Possible Resolutions to [employer]: cont appetite stim trial , family ed needs to be completed ? ? ?Continued Need for Acute Rehabilitation Level of Care: The patient requires daily medical  management by a physician with specialized training in physical medicine and rehabilitation for the following reasons: ?Direction of a multidisciplinary physical rehabilitation program to maximize functional independence : Yes ?Medical management of patient stability for increased activity during participation in an intensive rehabilitation regime.: Yes ?Analysis of laboratory values and/or radiology reports with any subsequent need for medication adjustment and/or medical intervention. : Yes ? ? ?I attest that I was present, lead the team conference, and concur with the assessment and plan of the team. ? ? ?Chana Bode B ?12/05/2021, 1:38 PM  ? ? ? ? ? ? ?

## 2021-12-05 NOTE — Progress Notes (Signed)
?                                                       PROGRESS NOTE ? ? ?Subjective/Complaints: ? ?Left side neck doing better ?Discussed ETOH use pt states that it was 53yrago ? ?ROS: Patient denies CP, SOB, N/V/D ? ?Objective: ?  ?No results found. ?No results for input(s): WBC, HGB, HCT, PLT in the last 72 hours. ? ?Recent Labs  ?  12/03/21 ?02703 ?CREATININE 0.78  ? ? ? ?Intake/Output Summary (Last 24 hours) at 12/05/2021 0759 ?Last data filed at 12/05/2021 0346 ?Gross per 24 hour  ?Intake 358 ml  ?Output 300 ml  ?Net 58 ml  ? ?  ? ?  ? ?Physical Exam: ?Vital Signs ?Blood pressure (!) 111/57, pulse 72, temperature 98.4 ?F (36.9 ?C), temperature source Oral, resp. rate 19, height '5\' 1"'  (1.549 m), weight 41.9 kg, SpO2 98 %. ? ? ? ?General: No acute distress ?Mood and affect are appropriate ?Heart: Regular rate and rhythm no rubs murmurs or extra sounds ?Lungs: Clear to auscultation, breathing unlabored, no rales or wheezes ?Abdomen: Positive bowel sounds, soft nontender to palpation, nondistended ?Extremities: No clubbing, cyanosis, or edema ? ? ?Skin: No evidence of breakdown, no evidence of rash ?Neurologic: Cranial nerves II through XII intact, motor strength is 5/5 in left 4+ right  deltoid, bicep, tricep, grip, hip flexor, knee extensors, ankle dorsiflexor and plantar flexor ?Sensory exam normal for light touch and pain in all 4 limbs. No limb ataxia or cerebellar signs. No abnormal tone appreciated.   ?Musculoskeletal: Full range of motion in all 4 extremities. Tenderness Left trap, no pain with C spine ROM  ? ? ?Assessment/Plan: ?1. Functional deficits which require 3+ hours per day of interdisciplinary therapy in a comprehensive inpatient rehab setting. ?Physiatrist is providing close team supervision and 24 hour management of active medical problems listed below. ?Physiatrist and rehab team continue to assess barriers to discharge/monitor patient progress toward functional and medical goals ? ?Care  Tool: ? ?Bathing ?   ?Body parts bathed by patient: Right arm, Left arm, Chest, Face  ?   ?  ?  ?Bathing assist Assist Level: Supervision/Verbal cueing ?  ?  ?Upper Body Dressing/Undressing ?Upper body dressing   ?What is the patient wearing?: Pull over shirt ?   ?Upper body assist Assist Level: Supervision/Verbal cueing ?   ?Lower Body Dressing/Undressing ?Lower body dressing ? ? ?   ?What is the patient wearing?: Pants ? ?  ? ?Lower body assist Assist for lower body dressing: Contact Guard/Touching assist ?   ? ?Toileting ?Toileting Toileting Activity did not occur (Landscape architectand hygiene only):  (condom cath)  ?Toileting assist Assist for toileting: Moderate Assistance - Patient 50 - 74% ?  ?  ?Transfers ?Chair/bed transfer ? ?Transfers assist ?   ? ?Chair/bed transfer assist level: Minimal Assistance - Patient > 75% ?  ?  ?Locomotion ?Ambulation ? ? ?Ambulation assist ? ?   ? ?Assist level: Minimal Assistance - Patient > 75% ?Assistive device: Walker-rolling ?Max distance: 150 ft  ? ?Walk 10 feet activity ? ? ?Assist ?   ? ?Assist level: Minimal Assistance - Patient > 75% ?Assistive device: Walker-rolling  ? ?Walk 50 feet activity ? ? ?Assist   ? ?Assist level: Minimal Assistance - Patient >  75% ?Assistive device: Walker-rolling  ? ? ?Walk 150 feet activity ? ? ?Assist   ? ?Assist level: Minimal Assistance - Patient > 75% ?Assistive device: Walker-rolling ?  ? ?Walk 10 feet on uneven surface  ?activity ? ? ?Assist Walk 10 feet on uneven surfaces activity did not occur: Safety/medical concerns ? ? ?  ?   ? ?Wheelchair ? ? ? ? ?Assist Is the patient using a wheelchair?: No ?Type of Wheelchair: Manual ?  ? ?  ?   ? ? ?Wheelchair 50 feet with 2 turns activity ? ? ? ?Assist ? ?  ?  ? ? ?   ? ?Wheelchair 150 feet activity  ? ? ? ?Assist ?   ? ? ?   ? ?Blood pressure (!) 111/57, pulse 72, temperature 98.4 ?F (36.9 ?C), temperature source Oral, resp. rate 19, height '5\' 1"'  (1.549 m), weight 41.9 kg, SpO2 98  %. ? ?Medical Problem List and Plan: ?1. 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            -patient may shower ?            -ELOS/Goals: 4/7 with sup/CGA ? -Continue CIR therapies including PT, OT, and SLP - Team conference today please see physician documentation under team conference tab, met with team  to discuss problems,progress, and goals. Formulized individual treatment plan based on medical history, underlying problem and comorbidities. ? ? ?2.  Antithrombotics: ?-DVT/anticoagulation:  Pharmaceutical: Lovenox ?            -antiplatelet therapy: Aspirin 325 mg daily and Plavix and 5 mg day x90 days ending 01/07/2022 then monotherapy with aspirin 81 mg thereafter ?3. Pain Management: Tylenol as needed ?Left trap pain, bengay, heat ordered - improved 4/5 ?4. Mood: Lexapro 20 mg daily.  Psychiatry services consulted ?            -antipsychotic agents: N/A ?5. Neuropsych: This patient he is capable of making decisions on his own behalf. ?6. Skin/Wound Care: Routine skin checks ?7. Fluids/Electrolytes/Nutrition: Routine in and out with follow-up chemistries ?8.  Hypothyroidism.  Synthroid ?9.  Hyperlipidemia.  Continue Crestor ?10.  HTN  ?Vitals:  ? 12/04/21 2000 12/05/21 0603  ?BP: (!) 109/57 (!) 111/57  ?Pulse: 64 72  ?Resp: 20 19  ?Temp: 98.5 ?F (36.9 ?C) 98.4 ?F (36.9 ?C)  ?SpO2: 98% 98%  ?Controlled 4/5 ? ?11.  History of alcohol/tobacco abuse.  Now in remission. Pt states it has been 5 yrs since he drank ETOH last ETOH + on tox was 2/17, denies tobacco use  ?12.  COVID-19 positive.  Completed course of Paxlovid and airborne precautions discontinued 3/26 ?13. ? UTI.  Urinalysis positive nitrite.  COntiminated specimen UC, UA was   ? -4/3 completed keflex ?     -recurrent urgency/frequency ?    -repeat UA neg, likely CVA related  ?14. Underweight: BMI 17.45: provide dietary education. Pt c/o poor appetite, add Megace now  eating 100% of meals  ?  ? ?LOS: ?9 days ?A FACE TO FACE EVALUATION WAS PERFORMED ? ?Luanna Salk Lakesha Levinson ?12/05/2021, 7:59 AM  ? ? ? ?

## 2021-12-06 ENCOUNTER — Other Ambulatory Visit (HOSPITAL_COMMUNITY): Payer: Self-pay

## 2021-12-06 MED ORDER — CLOPIDOGREL BISULFATE 75 MG PO TABS
75.0000 mg | ORAL_TABLET | Freq: Every day | ORAL | 0 refills | Status: AC
  Filled 2021-12-06 – 2021-12-12 (×2): qty 30, 30d supply, fill #0

## 2021-12-06 MED ORDER — ROSUVASTATIN CALCIUM 20 MG PO TABS
20.0000 mg | ORAL_TABLET | Freq: Every day | ORAL | 1 refills | Status: AC
  Filled 2021-12-06 – 2021-12-12 (×2): qty 30, 30d supply, fill #0

## 2021-12-06 MED ORDER — ESCITALOPRAM OXALATE 20 MG PO TABS
20.0000 mg | ORAL_TABLET | Freq: Every day | ORAL | 0 refills | Status: AC
  Filled 2021-12-06 – 2021-12-12 (×2): qty 30, 30d supply, fill #0

## 2021-12-06 MED ORDER — ACETAMINOPHEN 325 MG PO TABS: 650.0000 mg | ORAL_TABLET | ORAL | Status: AC | PRN

## 2021-12-06 MED ORDER — ASPIRIN 325 MG PO TBEC: 325.0000 mg | DELAYED_RELEASE_TABLET | Freq: Every day | ORAL | 0 refills | Status: AC

## 2021-12-06 MED ORDER — METOPROLOL TARTRATE 25 MG PO TABS
12.5000 mg | ORAL_TABLET | Freq: Two times a day (BID) | ORAL | 0 refills | Status: DC
  Filled 2021-12-06: qty 60, 60d supply, fill #0

## 2021-12-06 MED ORDER — OMEPRAZOLE 20 MG PO CPDR
20.0000 mg | DELAYED_RELEASE_CAPSULE | Freq: Every day | ORAL | 0 refills | Status: AC
  Filled 2021-12-06 – 2021-12-12 (×2): qty 30, 30d supply, fill #0

## 2021-12-06 MED ORDER — LEVOTHYROXINE SODIUM 50 MCG PO TABS
50.0000 ug | ORAL_TABLET | Freq: Every day | ORAL | 0 refills | Status: AC
  Filled 2021-12-06 – 2021-12-12 (×2): qty 30, 30d supply, fill #0

## 2021-12-06 NOTE — Progress Notes (Signed)
Occupational Therapy Session Note ? ?Patient Details  ?Name: Ralph Jackson ?MRN: [id number] ?Date of Birth: [birth year] ? ?Today's Date: 12/06/2021 ?OT Individual Time: 5188-4166 ?OT Individual Time Calculation (min): 54 min  ? ? ?Short Term Goals: ?Week 1:  OT Short Term Goal 1 (Week 1): STG = LTG 2/2 ELOS ?Week 2:  OT Short Term Goal 1 (Week 2): STG = LTG 2/2 ELOS ? ?Skilled Therapeutic Interventions/Progress Updates:  ?  Session Note: Pt received seated in recliner, no c/o pain, agreeable to therapy. Session focus on self-care retraining, activity tolerance, RUE NMR, fam edu in prep for improved ADL/IADL/func mobility performance + decreased caregiver burden. Declined shower. Ambulatory toilet transfer with close S this date, cues for RW management and to turn all the way prior to sitting. Min A for managing brief post continent void of bladder. Washed hands at sink with close S, required assist to place RW in hands due to pt attempting to ambulate to w/c without it. ? ?Called daughter at bedside for family education. Emphasized recommendations from close S with all mobility and that pt will require up to min A for toileting due to RUE weakness/poor awareness of thoroughness of hygiene. Daughter verbalized understanding of pt's curernt deficits and recommendations, but relates that she is unsure if she will be able to provide 24/7 S due to husband now being in ICU, she works during the day, and grandchildren are at school. Reviewed safe set-up options for pt downstairs and to have his cell phone on him, pt able to propell himself short distances in w/c, as well to decrease temptation to ambulate without assistance. Daughter with no further questions, would like to speak to SW further to discuss options/ SW notified and reports already having discussed. ? ?Self-propelled w/c > nurses station with min to mod A due to RUE weakness and R veering, performance improved when utilizing BLE + BUE. ? ?Pt completed  simulated TTB transfer with CGA after verbal instructions. DC reassessments completed as documented in DC note. Finally, reviewed HEP with red theraband, pt able to complete 1x10 cross body reaches, biceps curls and overhead reaches. ? ? ?Pt left in w/c with rehab tech in ortho gym in prep for group therapy class. ? ?Therapy Documentation ?Precautions:  ?Precautions ?Precautions: Fall ?Precaution Comments: R hemi, Bil hip weakness, HOH ?Restrictions ?Weight Bearing Restrictions: No ?Pain: ?Pain Assessment ?Pain Scale: 0-10 ?Pain Score: 0-No pain ? ?Therapy/Group: Individual Therapy ? ?Claudie Revering MS, OTR/L ? ?12/06/2021, 2:17 PM ?

## 2021-12-06 NOTE — Progress Notes (Signed)
Physical Therapy Discharge Summary ? ?Patient Details  ?Name: Ralph Jackson ?MRN: [id number] ?Date of Birth: September 04, 1945 ? ?Today's Date: 12/12/2021 ?PT Individual Time: 8563-1497 ?PT Individual Time Calculation (min): 69 min  ? ? ?Patient has met 8 of 8 long term goals due to improved activity tolerance, improved balance, improved postural control, increased strength, increased range of motion, ability to compensate for deficits, functional use of  right upper extremity and right lower extremity, improved attention, improved awareness, and improved coordination.  Patient to discharge at an ambulatory level Supervision Assist with RW and cues for safety with turns and awareness of obstacles on the R.   Patient's care partner is independent to provide the necessary physical assistance at discharge. ? ?Reasons goals not met: All PT goals met ? ?Recommendation:  ?Patient will benefit from ongoing skilled PT services in home health setting to continue to advance safe functional mobility, address ongoing impairments in balance, coordination gait, transfer, and minimize fall risk. ? ?Equipment: ?WC and RW ? ?Reasons for discharge: treatment goals met and discharge from hospital ? ?Patient/family agrees with progress made and goals achieved: Yes ? ? ? ? ?PT Discharge ? ?  ?Pain ?Pain Assessment ?Pain Scale: 0-10 ?Pain Score: 0-No pain ?Pain Interference ?Pain Interference ?Pain Effect on Sleep: 1. Rarely or not at all ?Pain Interference with Therapy Activities: 1. Rarely or not at all ?Pain Interference with Day-to-Day Activities: 1. Rarely or not at all ?Vision/Perception  ?Vision - History ?Ability to See in Adequate Light: 0 Adequate ?Vision - Assessment ?Additional Comments: mild R side inattention functionally, but improved from baseline ?Perception ?Perception: Within Functional Limits [facility] with testing) ?Praxis ?Praxis: Intact ?Praxis Impairment Details: Motor planning  ? ?Sensation ?Sensation ?Light Touch:  Appears Intact ?Proprioception: Appears Intact ?Coordination ?Gross Motor Movements are Fluid and Coordinated: No ?Coordination and Movement Description: dysmetria on teh RLE and decreased speed of movement on the RUE ?Finger Nose Finger Test: slower on R but WNL ?Heel Shin Test: decreased speed and ROM on the RLE. mild dysmetria RLE. LLE WFL ?Motor  ?Motor ?Motor: Hemiplegia ?Motor - Discharge Observations: hemiplegia RLE/RUE. improved from baseline  ?Mobility ?Bed Mobility ?Bed Mobility: Rolling Right;Supine to Sit;Sit to Supine;Rolling Left ?Rolling Right: Independent with assistive device ?Rolling Left: Independent with assistive device ?Supine to Sit: Independent with assistive device ?Sit to Supine: Independent with assistive device ?Transfers ?Transfers: Sit to Stand;Stand to [employer] Transfers ?Sit to Stand: Independent with assistive device;Supervision/Verbal cueing ?Stand to Sit: Supervision/Verbal cueing;Independent with assistive device ?Stand Pivot Transfers: Supervision/Verbal cueing ?Stand Pivot Transfer Details: Verbal cues for safe use of DME/AE ?Transfer (Assistive device): Rolling walker ?Locomotion  ?Gait ?Ambulation: Yes ?Gait Assistance: Supervision/Verbal cueing ?Gait Distance (Feet): 200 Feet ?Assistive device: Rolling walker ?Gait Assistance Details: Verbal cues for gait pattern;Verbal cues for safe use of DME/AE ?Gait ?Gait: Yes ?Gait Pattern: Impaired ?Gait Pattern: Step-through pattern;Decreased step length - right;Decreased stride length;Decreased hip/knee flexion - right;Trendelenburg (improved from eval) ?Stairs / Additional Locomotion ?Stairs: Yes ?Stairs Assistance: Supervision/Verbal cueing ?Stair Management Technique: One rail Right ?Number of Stairs: 12 ?Height of Stairs: 6 ?Pick up small object from the floor assist level: Supervision/Verbal cueing ?Wheelchair Mobility ?Wheelchair Mobility: Yes ?Wheelchair Assistance: Minimal assistance - Patient >75% ?Wheelchair  Propulsion: Both upper extremities ?Wheelchair Parts Management: Needs assistance ?Distance: 150  ?Trunk/Postural Assessment  ?Cervical Assessment ?Cervical Assessment: Exceptions to [facility] (forward head) ?Thoracic Assessment ?Thoracic Assessment: Exceptions to [facility] (rounded shoulders) ?Lumbar Assessment ?Lumbar Assessment: Exceptions to [facility] (posterior pelvic tilt) ?Postural Control ?  Postural Control: Within Functional Limits  ?Balance ?Balance ?Balance Assessed: Yes ?Static Sitting Balance ?Static Sitting - Level of Assistance: 6: Modified independent (Device/Increase time) ?Dynamic Sitting Balance ?Dynamic Sitting - Level of Assistance: 6: Modified independent (Device/Increase time) ?Static Standing Balance ?Static Standing - Balance Support: Bilateral upper extremity supported;During functional activity ?Static Standing - Level of Assistance: 5: Stand by assistance ?Dynamic Standing Balance ?Dynamic Standing - Balance Support: Bilateral upper extremity supported;During functional activity ?Dynamic Standing - Level of Assistance: 5: Stand by assistance ?Extremity Assessment  ?  ?  ?RLE Assessment ?RLE Assessment: Exceptions to [facility] ?RLE Strength ?RLE Overall Strength: Deficits ?Right Hip Flexion: 4-/5 ?Right Hip Extension: 3+/5 ?Right Hip ABduction: 3+/5 ?Right Hip ADduction: 4-/5 ?Right Knee Flexion: 3+/5 ?Right Knee Extension: 4-/5 ?Right Ankle Dorsiflexion: 3/5 ?Right Ankle Plantar Flexion: 3/5 ?LLE Assessment ?LLE Assessment: Exceptions to [facility] ?LLE Strength ?LLE Overall Strength: Deficits ?Left Hip Flexion: 4-/5 ?Left Hip Extension: 4-/5 ?Left Hip ABduction: 4-/5 ?Left Hip ADduction: 4/5 ?Left Knee Flexion: 4-/5 ?Left Knee Extension: 4/5 ?Left Ankle Dorsiflexion: 4-/5 ?Left Ankle Plantar Flexion: 4-/5 ? ? ? ?Lorie Phenix ?12/13/2021, 11:38 AM ?

## 2021-12-06 NOTE — Progress Notes (Signed)
Sw spoke with patient daughter and was informed she will be unable to care for patient at discharge due to her spouse having a stroke. SW reached out to [facility] Department of Health & [employer] and was informed the patient is eligible to transition to LTC Medicaid benefit. Sw will inform patient daughter to reach out to [facility] Department of Social Services to initiate. ? ? ?Reference # [id number] ?

## 2021-12-06 NOTE — Progress Notes (Signed)
Occupational Therapy Discharge Summary ? ?Patient Details  ?Name: Ralph Jackson ?MRN: [id number] ?Date of Birth: 03/16/1946 ? ?Today's Date: 12/06/2021 ? ? ?Patient has met 10 of 11 long term goals due to improved activity tolerance, improved balance, postural control, ability to compensate for deficits, functional use of  RIGHT upper extremity, improved awareness, and improved coordination.  Patient to discharge at overall  S to min A  level.  Patient's care partner requires assistance to provide the necessary physical and cognitive assistance at discharge.  Pt to DC at the below ADL/bathroom transfer performance level. Assist levels represent pt's most consistent performance when alert/participatory. Pt continues to be primarily limited by mild R hemiparesis, poor safety awareness, baseline cognitive deficits. Family education completed on 12/06/2021 via phone call with daughter; daughter verbalizes understanding of pt's current deficits and impact on BADL/mobility/IADL performance and recs for 24/7 S, especially with mobility. Daughter currently requires assistance to provide 24/7 S due to husband being hospitalized at time of DC and daughter working during the day. Pt and caregivers will benefit from continued  OP OT to facilitate improved caregiver education, functional mobility, and occupational performance.  ? ? ?Reasons goals not met: Pt continues to require up to min A for LBD to thread RLE. ? ?Recommendation:  ?Patient will benefit from ongoing skilled OT services in outpatient setting to continue to advance functional skills in the area of BADL and Reduce care partner burden. ? ?Equipment: ?TTB ? ?Reasons for discharge: treatment goals met and discharge from hospital ? ?Patient/family agrees with progress made and goals achieved: Yes ? ?OT Discharge ?Precautions/Restrictions  ?Precautions ?Precautions: Fall ?Precaution Comments: R hemi, Bil hip weakness, HOH ?Restrictions ?Weight Bearing Restrictions:  No ? ?Pain ?Pain Assessment ?Pain Scale: 0-10 ?Pain Score: 0-No pain ?ADL ?ADL ?Eating: Independent ?Where Assessed-Eating: Chair ?Grooming: Supervision/safety ?Where Assessed-Grooming: Standing at sink ?Upper Body Bathing: Independent ?Where Assessed-Upper Body Bathing: Edge of bed ?Lower Body Bathing: Supervision/safety ?Where Assessed-Lower Body Bathing: Edge of bed ?Upper Body Dressing: Independent ?Where Assessed-Upper Body Dressing: Edge of bed ?Lower Body Dressing: Minimal assistance ?Where Assessed-Lower Body Dressing: Edge of bed ?Toileting: Minimal assistance ?Where Assessed-Toileting: Toilet ?Toilet Transfer: Contact guard ?Toilet Transfer Method: Ambulating ?Science writer: Grab bars ?Tub/Shower Transfer: Contact guard ?Tub/Shower Transfer Method: Ambulating ?Tub/Shower Equipment: Radio broadcast assistant ?Walk-In Shower Transfer: Not assessed ?Vision ?Baseline Vision/History: 1 Wears glasses ?Patient Visual Report: No change from baseline ?Vision Assessment?: Yes ?Eye Alignment: Within Functional Limits ?Ocular Range of Motion: Within Functional Limits ?Alignment/Gaze Preference: Within Defined Limits ?Tracking/Visual Pursuits: Requires cues, head turns, or add eye shifts to track ?Saccades: Decreased speed of saccadic movement ?Convergence: Impaired (comment) (L eye did not converge) ?Additional Comments: mild R side inattention functionally, but improved from baseline ?Perception  ?Perception: Within Functional Limits ?Praxis ?Praxis: Impaired ?Praxis Impairment Details: Motor planning ?Cognition ?Cognition ?Overall Cognitive Status: History of cognitive impairments - at baseline ?Arousal/Alertness: Awake/alert ?Orientation Level: Person;Place;Situation ?Person: Oriented ?Place: Oriented ?Situation: Oriented ?Memory: Impaired ?Memory Impairment: Decreased short term memory;Decreased long term memory;Decreased recall of new information ?Decreased Short Term Memory: Verbal basic;Functional  basic ?Attention: Selective ?Sustained Attention: Appears intact ?Selective Attention: Appears intact ?Awareness: Impaired ?Awareness Impairment: Intellectual impairment ?Problem Solving: Impaired ?Problem Solving Impairment: Verbal complex;Functional complex ?Executive Function: Self Monitoring ?Reasoning: Impaired ?Reasoning Impairment: Verbal complex ?Organizing: Impaired ?Organizing Impairment: Verbal basic ?Self Monitoring: Impaired ?Self Monitoring Impairment: Verbal basic ?Self Correcting: Impaired ?Self Correcting Impairment: Verbal basic ?Safety/Judgment: Impaired ?Comments: mild impuslvity with mobility, poor thoroughness of self-care bathing/grooming/hygiene tasks,  requires consistent cuing for RW management ?Brief Interview for Mental Status (BIMS) ?Repetition of Three Words (First Attempt): 3 ?Temporal Orientation: Year: Correct ?Temporal Orientation: Month: Accurate within 5 days ?Temporal Orientation: Day: Correct ?Recall: "Sock": Yes, no cue required ?Recall: "Blue": Yes, after cueing ("a color") ?Recall: "Bed": Yes, after cueing ("a piece of furniture") ?BIMS Summary Score: 13 ?Sensation ?Sensation ?Light Touch: Appears Intact ?Hot/Cold: Appears Intact ?Proprioception: Appears Intact ?Stereognosis: Appears Intact ?Additional Comments: reports improvement in RUE N/T ?Coordination ?Gross Motor Movements are Fluid and Coordinated: No ?Fine Motor Movements are Fluid and Coordinated: Yes ?Coordination and Movement Description: mild R HP, mild trendelenburg ?Finger Nose Finger Test: slower on R but WNL ?9 Hole Peg Test: R: 1 min 43 seconds, L: 48 seconds ?Motor  ?Motor ?Motor: Other (comment) (mild R hemiparesis) ?Motor - Discharge Observations: hemiparesis RLE/RUE. improved from baseline ?Mobility  ?Bed Mobility ?Bed Mobility: Supine to Sit;Sit to Supine ?Rolling Right: Independent with assistive device ?Rolling Left: Independent with assistive device ?Supine to Sit: Independent with assistive  device ?Sit to Supine: Independent with assistive device ?Transfers ?Sit to Stand: Independent with assistive device;Supervision/Verbal cueing ?Stand to Sit: Supervision/Verbal cueing;Independent with assistive device  ?Trunk/Postural Assessment  ?Cervical Assessment ?Cervical Assessment: Exceptions to [facility] (forward head) ?Thoracic Assessment ?Thoracic Assessment: Exceptions to [facility] (rounded shoulders) ?Lumbar Assessment ?Lumbar Assessment: Exceptions to [facility] (posterior pelvic tilt) ?Postural Control ?Postural Control: Within Functional Limits  ?Balance ?Balance ?Balance Assessed: Yes ?Static Sitting Balance ?Static Sitting - Balance Support: Feet supported;No upper extremity supported ?Static Sitting - Level of Assistance: 6: Modified independent (Device/Increase time) ?Dynamic Sitting Balance ?Dynamic Sitting - Balance Support: Feet supported;No upper extremity supported ?Dynamic Sitting - Level of Assistance: 6: Modified independent (Device/Increase time) ?Static Standing Balance ?Static Standing - Balance Support: Bilateral upper extremity supported;During functional activity ?Static Standing - Level of Assistance: 5: Stand by assistance ?Dynamic Standing Balance ?Dynamic Standing - Balance Support: Bilateral upper extremity supported;During functional activity ?Dynamic Standing - Level of Assistance: 5: Stand by assistance ?Extremity/Trunk Assessment ?RUE Assessment ?RUE Assessment: Exceptions to [facility] ?General Strength Comments: mild HP, 4-/5 throughout ?RUE Body System: Neuro ?Brunstrum levels for arm and hand: Arm;Hand ?Brunstrum level for arm: Stage V Relative Independence from [employer]nergy ?Brunstrum level for hand: Stage VI Isolated joint movements ?LUE Assessment ?LUE Assessment: Within Functional Limits ? ? ? ?Volanda Napoleon MS, OTR/L ? ? ?12/06/2021, 12:48 PM ?

## 2021-12-06 NOTE — Progress Notes (Signed)
?                                                       PROGRESS NOTE ? ? ?Subjective/Complaints: ? ?No issues overnite , working with PT, pleased with d/c date but son in law just admitted to hospital with MI  ? ?ROS: Patient denies CP, SOB, N/V/D ? ?Objective: ?  ?No results found. ?No results for input(s): WBC, HGB, HCT, PLT in the last 72 hours. ? ?No results for input(s): NA, K, CL, CO2, GLUCOSE, BUN, CREATININE, CALCIUM in the last 72 hours. ? ? ?Intake/Output Summary (Last 24 hours) at 12/06/2021 0809 ?Last data filed at 12/06/2021 0753 ?Gross per 24 hour  ?Intake 1200 ml  ?Output --  ?Net 1200 ml  ? ?  ? ?  ? ?Physical Exam: ?Vital Signs ?Blood pressure 106/64, pulse 62, temperature 97.8 ?F (36.6 ?C), resp. rate 16, height 5\' 1"  (1.549 m), weight 41.9 kg, SpO2 100 %. ? ? ? ?General: No acute distress ?Mood and affect are appropriate ?Heart: Regular rate and rhythm no rubs murmurs or extra sounds ?Lungs: Clear to auscultation, breathing unlabored, no rales or wheezes ?Abdomen: Positive bowel sounds, soft nontender to palpation, nondistended ?Extremities: No clubbing, cyanosis, or edema ? ? ?Skin: No evidence of breakdown, no evidence of rash ?Neurologic: Cranial nerves II through XII intact, motor strength is 5/5 in left 4+ right  deltoid, bicep, tricep, grip, hip flexor, knee extensors, ankle dorsiflexor and plantar flexor ?Sensory exam normal for light touch and pain in all 4 limbs. No limb ataxia or cerebellar signs. No abnormal tone appreciated.   ?Musculoskeletal: Full range of motion in all 4 extremities. Tenderness Left trap, no pain with C spine ROM  ? ? ?Assessment/Plan: ?1. Functional deficits which require 3+ hours per day of interdisciplinary therapy in a comprehensive inpatient rehab setting. ?Physiatrist is providing close team supervision and 24 hour management of active medical problems listed below. ?Physiatrist and rehab team continue to assess barriers to discharge/monitor patient progress  toward functional and medical goals ? ?Care Tool: ? ?Bathing ?   ?Body parts bathed by patient: Right arm, Left arm, Chest, Face, Abdomen, Front perineal area, Buttocks, Right upper leg, Left upper leg  ?   ?  ?  ?Bathing assist Assist Level: Supervision/Verbal cueing ?  ?  ?Upper Body Dressing/Undressing ?Upper body dressing   ?What is the patient wearing?: Pull over shirt ?   ?Upper body assist Assist Level: Independent ?   ?Lower Body Dressing/Undressing ?Lower body dressing ? ? ?   ?What is the patient wearing?: Pants, Incontinence brief ? ?  ? ?Lower body assist Assist for lower body dressing: Minimal Assistance - Patient > 75% ?   ? ?Toileting ?Toileting Toileting Activity did not occur and hygiene only):  (condom cath)  ?Toileting assist Assist for toileting: Minimal Assistance - Patient > 75% ?  ?  ?Transfers ?Chair/bed transfer ? ?Transfers assist ?   ? ?Chair/bed transfer assist level: Minimal Assistance - Patient > 75% ?  ?  ?Locomotion ?Ambulation ? ? ?Ambulation assist ? ?   ? ?Assist level: Minimal Assistance - Patient > 75% ?Assistive device: Walker-rolling ?Max distance: 150 ft  ? ?Walk 10 feet activity ? ? ?Assist ?   ? ?Assist level: Minimal Assistance - Patient > 75% ?  Assistive device: Walker-rolling  ? ?Walk 50 feet activity ? ? ?Assist   ? ?Assist level: Minimal Assistance - Patient > 75% ?Assistive device: Walker-rolling  ? ? ?Walk 150 feet activity ? ? ?Assist   ? ?Assist level: Minimal Assistance - Patient > 75% ?Assistive device: Walker-rolling ?  ? ?Walk 10 feet on uneven surface  ?activity ? ? ?Assist Walk 10 feet on uneven surfaces activity did not occur: Safety/medical concerns ? ? ?  ?   ? ?Wheelchair ? ? ? ? ?Assist Is the patient using a wheelchair?: No ?Type of Wheelchair: Manual ?  ? ?  ?   ? ? ?Wheelchair 50 feet with 2 turns activity ? ? ? ?Assist ? ?  ?  ? ? ?   ? ?Wheelchair 150 feet activity  ? ? ? ?Assist ?   ? ? ?   ? ?Blood pressure 106/64, pulse 62,  temperature 97.8 ?F (36.6 ?C), resp. rate 16, height 5\' 1"  (1.549 m), weight 41.9 kg, SpO2 100 %. ? ?Medical Problem List and Plan: ?1. 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            -patient may shower ?            -ELOS/Goals: 4/7 with sup/CGA ? -Continue CIR therapies including PT, OT, and SLP -  ? ?2.  Antithrombotics: ?-DVT/anticoagulation:  Pharmaceutical: Lovenox ?            -antiplatelet therapy: Aspirin 325 mg daily and Plavix and 5 mg day x90 days ending 01/07/2022 then monotherapy with aspirin 81 mg thereafter ?3. Pain Management: Tylenol as needed ?Left trap pain, bengay, heat ordered - improved 4/6 ?4. Mood: Lexapro 20 mg daily.  Psychiatry services consulted ?            -antipsychotic agents: N/A ?5. Neuropsych: This patient he is capable of making decisions on his own behalf. ?6. Skin/Wound Care: Routine skin checks ?7. Fluids/Electrolytes/Nutrition: Routine in and out with follow-up chemistries ?8.  Hypothyroidism.  Synthroid ?9.  Hyperlipidemia.  Continue Crestor ?10.  HTN  ?Vitals:  ? 12/05/21 1942 12/06/21 0352  ?BP: 103/64 106/64  ?Pulse: 72 62  ?Resp: 18 16  ?Temp: 99.1 ?F (37.3 ?C) 97.8 ?F (36.6 ?C)  ?SpO2: 99% 100%  ?Controlled 4/6 ? ?11.  History of alcohol/tobacco abuse.  Now in remission. Pt states it has been 5 yrs since he drank ETOH last ETOH + on tox was 10/2015, denies tobacco use  ?12.  COVID-19 positive.  Completed course of Paxlovid and airborne precautions discontinued 3/26 ?13. ? UTI.  Urinalysis positive nitrite.  COntiminated specimen UC, UA was   ? -4/3 completed keflex ?     -recurrent urgency/frequency ?    -repeat UA neg, likely CVA related  ?14. Underweight: BMI 17.45: provide dietary education. Pt c/o poor appetite, add Megace now eating 100% of meals  ?  ? ?LOS: ?10 days ?A FACE TO FACE EVALUATION WAS PERFORMED ? ?6/3 Drayk Humbarger ?12/06/2021, 8:09 AM  ? ? ? ?

## 2021-12-06 NOTE — Progress Notes (Addendum)
Inpatient Rehabilitation Discharge Medication Review by a Pharmacist ? ?A complete drug regimen review was completed for this patient to identify any potential clinically significant medication issues. ? ?High Risk Drug Classes Is patient taking? Indication by Medication  ?Antipsychotic No   ?Anticoagulant No   ?Antibiotic No   ?Opioid No   ?Antiplatelet Yes ASA 325, plavix 75 - stroke prevention  ?Hypoglycemics/insulin No   ?Vasoactive Medication Yes Lopressor - HTN  ?Chemotherapy No   ?Other Yes Crestor - HLD ?Synthroid - Hypothyroidism ?Prilosec - GERD ?Lexapro - mood disorder ?Prolia - osteoporosis  ?Clairitin - allergies ?MIV - supplementation  ? ? ? ?Type of Medication Issue Identified Description of Issue Recommendation(s)  ?Drug Interaction(s) (clinically significant) ?    ?Duplicate Therapy ?    ?Allergy ?    ?No Medication Administration End Date ?    ?Incorrect Dose ?    ?Additional Drug Therapy Needed ?    ?Significant med changes from prior encounter (inform family/care partners about these prior to discharge).    ?Other ?    ? ? ?Clinically significant medication issues were identified that warrant physician communication and completion of prescribed/recommended actions by midnight of the next day:  No ? ? ?Time spent performing this drug regimen review (minutes):  30 ? ? ?Darden Dates Anastasov ?12/06/2021 10:28 AM ? ?Torin Whisner BS, PharmD, BCPS ?Clinical Pharmacist ?12/07/2021 6:56 AM ?

## 2021-12-06 NOTE — Progress Notes (Signed)
Patient ID: Ralph Jackson, male   DOB: 12/20/1945, 76 y.o.   MRN: [id number] ? ?Patient information faxed to Steward Drone (AD) at [facility].  ?F: [phone] ?

## 2021-12-06 NOTE — Progress Notes (Signed)
Occupational Therapy Session Note ? ?Patient Details  ?Name: Ralph Jackson ?MRN: [id number] ?Date of Birth: 1946/08/26 ? ?Today's Date: 12/06/2021 ?OT Group Time: 1101-1200 ?OT Group Time Calculation (min): 59 min ? ? ?Short Term Goals: ?Week 1:  OT Short Term Goal 1 (Week 1): STG = LTG 2/2 ELOS ? ?Skilled Therapeutic Interventions/Progress Updates:  ?Pt participate in group session with a focus on therapeutic activity of completing obstacles course to simulate community mobility as well as engaging in seated BUE therex to  increase UB strength and endurance to improve overall independence with ADLs/ functional mobility. First pt completed obstacles course with pt instructed to step over obstacles, step up onto curb, walk across uneven surfaces and maneuver RW around cones. Pt completed course with RW and MIN A, MAX cues for RW mgmt. In between pts turns pt also completed seated therex as indicated below, with pt completing each therex for 1 min and then resting for 30 secs. Therex included:  ?Bicep curls ?Flys ?Seated Kicks ?Seated marches ?Upright rows ?Chest presses ?OH presses ?Arm circles ?Pt noted to mostly use LUE only during there d/t pain, noted decreased motor planning during therex needing max verbal cues for body mechanics.  ?Discussed fall recovery/prevention. Discussed steps for fall recovery such as making sure you have your cellphone close by and calling for help first. Then, if you feel like you can get up discussed steps for how to get up. Visual demo provided on prone>quadraped and then using sturdy object to come up onto one knee then turning to sit into chair. Education provided on knowing the local fire dept number to assist with helping pts up if they were to have a fall at home. Education also provided on fall prevention such as removing obstacles in home such as excess clutter, remove throw rugs, allow adequate lighting, manage pets/kids when ambulating. Additionally discussed tips for  community mobility such as using energy conservation strategies effectively.  ? ?Therapy Documentation ?Precautions:  ?Precautions ?Precautions: Fall ?Precaution Comments: R hemi, Bil hip weakness, HOH ?Restrictions ?Weight Bearing Restrictions: No ? ?Pain: no pain reported during session  ? ? ? ?Therapy/Group: Group Therapy ? ?[name] ?12/06/2021, 3:20 PM ?

## 2021-12-07 ENCOUNTER — Other Ambulatory Visit (HOSPITAL_COMMUNITY): Payer: Self-pay

## 2021-12-07 NOTE — Progress Notes (Signed)
Physical Therapy Session Note ? ?Patient Details  ?Name: [name] ?MRN: [id number] ?Date of Birth: 09/10/1945 ? ?Today's Date: 12/06/2021 ?PT Individual Time: 801-910 ?  69 min  ? ?Short Term Goals: ?Week 1:  PT Short Term Goal 1 (Week 1): Pt will perform bed mobility with consistent supervision and no use of bed features. ?PT Short Term Goal 1 - Progress (Week 1): Met ?PT Short Term Goal 2 (Week 1): Pt will perform all functional standing transfers with supervision and safe technique. ?PT Short Term Goal 2 - Progress (Week 1): Met ?PT Short Term Goal 3 (Week 1): Pt will ambulate with improved R trendelenberg using RW with close supervision. ?PT Short Term Goal 3 - Progress (Week 1): Met ?PT Short Term Goal 4 (Week 1): Pt will complete at least 4 steps with RHR only with consistent MinA. ?PT Short Term Goal 4 - Progress (Week 1): Met ?Week 2:  PT Short Term Goal 1 (Week 2): STG=LTG due to ELOS ? ?Skilled Therapeutic Interventions/Progress Updates:  ? ?Pt received supine in bed and agreeable to PT. Supine>sit transfer without assist or cues.   ?  ?Lower body dressing with set up assist following incontinent bladder. Transfers including sit<>stand and stand pivot with RW and supervision assist.  ?  ?Gait training with RW and supervision assist x 200ft. .  ?  ?Stair management training with 1 rial on the R for ascent x 12 with supervision assist. Intermittent step to with either LE.  ?  ?Pt performed 5 time sit<>stand (5xSTS): 22 sec (>15 sec indicates increased fall risk)  ?  ?PT instructed pt in TUG: 57 sec ( >13.5 sec indicates increased fall risk) ?  ?Car transfer training with supervision assist and no AD; cues for stand pivot technique rather then SLS to step into car. Poor cary over  ?  ?Picking cup up from floor supervision assist with UE support on RW.  ?  ?PT instructed pt in Grad day assessment to measure progress toward goals. See below for details. ?   ? ?Therapy Documentation ?Precautions:   ?Precautions ?Precautions: Fall ?Precaution Comments: R hemi, Bil hip weakness, HOH ?Restrictions ?Weight Bearing Restrictions: No ? ?  ?Vital Signs: ?Therapy Vitals ?Temp: 98.2 ?F (36.8 ?C) ?Pulse Rate: 62 ?Resp: 14 ?BP: 136/87 ?Patient Position (if appropriate): Lying ?Oxygen Therapy ?SpO2: 100 % ?O2 Device: Room Air ?denies ? ? ? ?Therapy/Group: Individual Therapy ? ? E  ?12/07/2021, 7:47 AM  ?

## 2021-12-07 NOTE — Progress Notes (Signed)
?                                                       PROGRESS NOTE ? ? ?Subjective/Complaints: ? ?D/C plans changed due to caregiver hospitalized with MI ? ?ROS: Patient denies CP, SOB, N/V/D ? ?Objective: ?  ?No results found. ?No results for input(s): WBC, HGB, HCT, PLT in the last 72 hours. ? ?No results for input(s): NA, K, CL, CO2, GLUCOSE, BUN, CREATININE, CALCIUM in the last 72 hours. ? ? ?Intake/Output Summary (Last 24 hours) at 12/07/2021 0814 ?Last data filed at 12/07/2021 0700 ?Gross per 24 hour  ?Intake 600 ml  ?Output --  ?Net 600 ml  ? ?  ? ?  ? ?Physical Exam: ?Vital Signs ?Blood pressure 136/87, pulse 62, temperature 98.2 ?F (36.8 ?C), resp. rate 14, height 5\' 1"  (1.549 m), weight 41.9 kg, SpO2 100 %. ? ? ? ?General: No acute distress ?Mood and affect are appropriate ?Heart: Regular rate and rhythm no rubs murmurs or extra sounds ?Lungs: Clear to auscultation, breathing unlabored, no rales or wheezes ?Abdomen: Positive bowel sounds, soft nontender to palpation, nondistended ?Extremities: No clubbing, cyanosis, or edema ? ? ?Skin: No evidence of breakdown, no evidence of rash ?Neurologic: Cranial nerves II through XII intact, motor strength is 5/5 in left 4+ right  deltoid, bicep, tricep, grip, hip flexor, knee extensors, ankle dorsiflexor and plantar flexor ?Sensory exam normal for light touch and pain in all 4 limbs. No limb ataxia or cerebellar signs. No abnormal tone appreciated.  Mild fine motor deficits RUE  ?Musculoskeletal: Full range of motion in all 4 extremities. Tenderness Left trap, no pain with C spine ROM  ? ? ?Assessment/Plan: ?1. Functional deficits which require 3+ hours per day of interdisciplinary therapy in a comprehensive inpatient rehab setting. ?Physiatrist is providing close team supervision and 24 hour management of active medical problems listed below. ?Physiatrist and rehab team continue to assess barriers to discharge/monitor patient progress toward functional and medical  goals ? ?Care Tool: ? ?Bathing ?   ?Body parts bathed by patient: Right arm, Left upper leg  ?   ?  ?  ?Bathing assist Assist Level: Supervision/Verbal cueing ?  ?  ?Upper Body Dressing/Undressing ?Upper body dressing   ?What is the patient wearing?: Pull over shirt ?   ?Upper body assist Assist Level: Independent with assistive device ?   ?Lower Body Dressing/Undressing ?Lower body dressing ? ? ?   ?What is the patient wearing?: Pants, Incontinence brief ? ?  ? ?Lower body assist Assist for lower body dressing: Minimal Assistance - Patient > 75% ?   ? ?Toileting ?Toileting Toileting Activity did not occur Landscape architect and hygiene only):  (condom cath)  ?Toileting assist Assist for toileting: Independent ?  ?  ?Transfers ?Chair/bed transfer ? ?Transfers assist ?   ? ?Chair/bed transfer assist level: Supervision/Verbal cueing ?  ?  ?Locomotion ?Ambulation ? ? ?Ambulation assist ? ?   ? ?Assist level: Supervision/Verbal cueing ?Assistive device: Walker-rolling ?Max distance: 200  ? ?Walk 10 feet activity ? ? ?Assist ?   ? ?Assist level: Supervision/Verbal cueing ?Assistive device: Walker-rolling  ? ?Walk 50 feet activity ? ? ?Assist   ? ?Assist level: Supervision/Verbal cueing ?Assistive device: Walker-rolling  ? ? ?Walk 150 feet activity ? ? ?Assist   ? ?  Assist level: Supervision/Verbal cueing ?Assistive device: Walker-rolling ?  ? ?Walk 10 feet on uneven surface  ?activity ? ? ?Assist Walk 10 feet on uneven surfaces activity did not occur: Safety/medical concerns ? ? ?Assist level: Supervision/Verbal cueing ?Assistive device: Walker-rolling  ? ?Wheelchair ? ? ? ? ?Assist Is the patient using a wheelchair?: No ?Type of Wheelchair: Manual ?  ? ?Wheelchair assist level: Minimal Assistance - Patient > 75% ?Max wheelchair distance: 150  ? ? ?Wheelchair 50 feet with 2 turns activity ? ? ? ?Assist ? ?  ?  ? ? ?Assist Level: Minimal Assistance - Patient > 75%  ? ?Wheelchair 150 feet activity  ? ? ? ?Assist ?    ? ? ?Assist Level: Minimal Assistance - Patient > 75%  ? ?Blood pressure 136/87, pulse 62, temperature 98.2 ?F (36.8 ?C), resp. rate 14, height 5\' 1"  (1.549 m), weight 41.9 kg, SpO2 100 %. ? ?Medical Problem List and Plan: ?1. 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            -patient may shower ?            -ELOS/Goals: original date 4/7 with sup/CGA, now plan is for ALF on 4/10 or 4/11 ? -Continue CIR therapies including PT, OT, and SLP -  ? ?2.  Antithrombotics: ?-DVT/anticoagulation:  Pharmaceutical: Lovenox ?            -antiplatelet therapy: Aspirin 325 mg daily and Plavix and 5 mg day x90 days ending 01/07/2022 then monotherapy with aspirin 81 mg thereafter ?3. Pain Management: Tylenol as needed ?Left trap pain, bengay, heat ordered - improved 4/6 ?4. Mood: Lexapro 20 mg daily.  Psychiatry services consulted ?            -antipsychotic agents: N/A ?5. Neuropsych: This patient he is capable of making decisions on his own behalf. ?6. Skin/Wound Care: Routine skin checks ?7. Fluids/Electrolytes/Nutrition: Routine in and out with follow-up chemistries ?8.  Hypothyroidism.  Synthroid ?9.  Hyperlipidemia.  Continue Crestor ?10.  HTN  ?Vitals:  ? 12/06/21 2007 12/07/21 0440  ?BP: 116/74 136/87  ?Pulse: 73 62  ?Resp: 14 14  ?Temp: 98.3 ?F (36.8 ?C) 98.2 ?F (36.8 ?C)  ?SpO2: 100% 100%  ?Controlled 4/7 ? ?11.  History of alcohol/tobacco abuse.  Now in remission. Pt states it has been 5 yrs since he drank ETOH last ETOH + on tox was 10/2015, denies tobacco use  ?12.  COVID-19 positive.  Completed course of Paxlovid and airborne precautions discontinued 3/26 ?13. ? UTI.  Urinalysis positive nitrite.   ? -4/3 completed keflex ?     -recurrent urgency/frequency ?    -repeat UA neg, likely CVA related  ?14. Underweight: BMI 17.45: provide dietary education. Pt c/o poor appetite, add Megace now eating 100% of meals  ?  ? ?LOS: ?11 days ?A  FACE TO FACE EVALUATION WAS PERFORMED ? ?Luanna Salk Eudelia Hiltunen ?12/07/2021, 8:14 AM  ? ? ? ?

## 2021-12-07 NOTE — Progress Notes (Signed)
Physical Therapy Session Note ? ?Patient Details  ?Name: Ralph Jackson ?MRN: [id number] ?Date of Birth: 04-10-1946 ? ?Today's Date: 12/07/2021 ?PT Individual Time: 5396-7289 ?PT Individual Time Calculation (min): 45 min  ? ?Short Term Goals: ?Week 1:  PT Short Term Goal 1 (Week 1): Pt will perform bed mobility with consistent supervision and no use of bed features. ?PT Short Term Goal 1 - Progress (Week 1): Met ?PT Short Term Goal 2 (Week 1): Pt will perform all functional standing transfers with supervision and safe technique. ?PT Short Term Goal 2 - Progress (Week 1): Met ?PT Short Term Goal 3 (Week 1): Pt will ambulate with improved R trendelenberg using RW with close supervision. ?PT Short Term Goal 3 - Progress (Week 1): Met ?PT Short Term Goal 4 (Week 1): Pt will complete at least 4 steps with RHR only with consistent MinA. ?PT Short Term Goal 4 - Progress (Week 1): Met ?Week 2:  PT Short Term Goal 1 (Week 2): STG=LTG due to ELOS ? ?Skilled Therapeutic Interventions/Progress Updates:  ? Received pt sitting in recliner, pt agreeable to PT treatment, and denied any pain during session. Session with emphasis on functional mobility/transfers, generalized strengthening and endurance, dynamic standing balance/coordination, NMR, toileting, and gait training. Sit<>stand with RW and supervision and ambulated 138f with RW and CGA towards [place] Not[place] decreased step length on R with increased R foot drag with fatigue, resulting in 1 anterior LOB requiring min A to correct. Worked on dynamic standing balance performing RLE toe taps to 3 cones with min HHA 1x2 reps and 1x3 reps with cues for technique and anterior weight shifting to correct posterior lean. Transitioned to mini squats 3x10 reps without UE support and CGA for balance with emphasis on R lateral weight shifting and glute activation. Worked on standing hip abduction 3x10 bilaterally with yellow TB around ankles with emphasis on hip abduction strength.  Pt performed 4x5 sit<>stands on Airex without UE support and CGA - cues to come completely upright and for eccentric control when sitting to avoid plopping. NT present to check vitals. Sit<>stand with RW and supervision and pt ambulated 260fwith RW and close supervision back to room and in/out of bathroom with RW and CGA. Pt able to manage clothing with close supervision and continent of bowel and bladder - performed peri-care seated with set up assist and stood at sink to wash hands with supervision. Concluded session with pt sitting in recliner, needs within reach, and seatbelt alarm on.  ? ?Therapy Documentation ?Precautions:  ?Precautions ?Precautions: Fall ?Precaution Comments: R hemi, Bil hip weakness, HOH ?Restrictions ?Weight Bearing Restrictions: No ? ?Therapy/Group: Individual Therapy ?AnBlenda NicelyAnBecky SaxT, DPT  ?12/07/2021, 7:42 AM  ?

## 2021-12-07 NOTE — Progress Notes (Signed)
Physical Therapy Session Note ? ?Patient Details  ?Name: Ralph Jackson ?MRN: [id number] ?Date of Birth: [birth year] ? ?Today's Date: 12/07/2021 ?PT Individual Time: 7998-0012 ?PT Individual Time Calculation (min): 61 min  ? ?Short Term Goals: ? ?Week 2:  PT Short Term Goal 1 (Week 2): STG=LTG due to ELOS ? ? ?Skilled Therapeutic Interventions/Progress Updates:  ?Pt received sitting in WC and agreeable to PT. Pt performed sit<>stand transfer with RW and supervision assist to perform lower body dressing.  ? ?Transported to rehab gym in [facility]. Gait training with RW x 152f with supervision assist from PT. Noted to have mild increase in foot drag on the RLE with shoes donned on this day. No LOB.  ? ?Dynamic gait training with RW to step over 1 inch obstacles in floor 4 x 4 with close supervision assist from PT. Weave through 8 cones x 2 with supervision assist and cues for AD management. Noted to have improved step length on the R on this day. .  ? ?Nustep reciprocal movement training with moderate cues for attention to the RLE throughout to maintain neutral hip ER throughout 5 min +4 min with therapeutic rest break between bouts. .  ? ?Patient returned to room and performed stand pivot to toilet for bowel movement supervision assist for pericare with cues for improve awareness of RUE.  ? ?Ambulatory transfer to recliner with supervision assist, [facility] and all needs met.   ? ?   ? ?Therapy Documentation ?Precautions:  ?Precautions ?Precautions: Fall ?Precaution Comments: R hemi, Bil hip weakness, HOH ?Restrictions ?Weight Bearing Restrictions: No ? ?Vital Signs: ?Therapy Vitals ?Temp: 98.3 ?F (36.8 ?C) ?Temp Source: Oral ?Pulse Rate: 77 ?Resp: 17 ?BP: 127/86 ?Patient Position (if appropriate): Sitting ?Oxygen Therapy ?SpO2: 100 % ?O2 Device: Room Air ?Pain: ?denies ? ? ? ?Therapy/Group: Individual Therapy ? ?ALorie Phenix?12/07/2021, 3:18 PM  ?

## 2021-12-07 NOTE — Progress Notes (Signed)
Occupational Therapy Session Note ? ?Patient Details  ?Name: Ralph Jackson ?MRN: [id number] ?Date of Birth: 1946/03/19 ? ?Today's Date: 12/07/2021 ?OT Individual Time: 7782-4235 session 1 ?OT Individual Time Calculation (min): 53 min  ?Session 2: 1453-1531 ( 38 mins) ? ? ?Short Term Goals: ?Week 2:  OT Short Term Goal 1 (Week 2): STG = LTG 2/2 ELOS ? ?Skilled Therapeutic Interventions/Progress Updates:  ? Session 1: Pt greeted seated in recliner, pt agreeable to OT intervention. Session focus on RUE coordination, functional mobility, dynamic standing balance, BUE strength/endurance and decreasing overall caregiver burden.       ?Pt aware of DC being extended and reports he's trying to make the best of it. Pt completed functional ambulation from room to gym on 5C with Rw and CGA. Pt engaged in therapeutic activity of playing connect 4 with an emphasis on sit>stands and RUE coordination. Pt instructed to sit>stand during each turn with pt completing stands throughout activity with supervision. Once standing pt to retreive game piece with R hand and place in board with an emphasis on in hand manipulation skills such as rotation, translation and rotation. Pt noted to have fair awareness to game rules showing fair problem solving skills, needing one verbal cue to notice 4 in a row in middle of board. ?Next, pt engaged in dynamic standing balance task where pt stood with CGA with one UE supported OTA called various number combinations with pt instructed to tap correct numbers in order. Pt able to sequence through 3 number combinations but needed MOD cues to sequence more than 4 numbers at a time. Pt also able to stand and use BUEs to put numbers in correct order, graded task up and had pt order all numbers to equal a total sum. Pt able to complete problem solving task with overall supervision. CGA for balance with no UE support.  Pt ambulated ~ 50 ft with Rw and CGA. Pt then completed seated BUE therex with level 3  theraband, therex included: ?2x15 bicep curls ?2x15 shoulder flexion ?X10 chest pulls ?Had pt teach therex to OTA to increase carryover. Pt ambulated back to room with Rw and CGA. Pt left seated in recliner with safety belt activated and all needs within reach.  ?                      ? ?Session 2: Pt greeted seated in recliner, pt agreeable to OT intervention. Session focus on RUE coordination, RUE FMC,  functional mobility, dynamic standing balance and decreasing overall caregiver burden.      ?Pt completed stand pivots with Rw during session with CGA, pt transported to gym with total A from w/c for time mgmt. Pt engaged in standing dynamic balance task where pt sit>stand from w/c with CGA. Pt instructed to reach out of BOS with RUE to retrieve playing cards placed on side table and match cards on table in front of pt to work on dynamic standing balance and RUE FMC. Pt completed task with close supervision, MIN verbal cues needed to sequence correct placement of cards noted to get confused by 9 and 6.  ?Next pt worked on seated therapeutic activity of completing bell cancellation test to work on RUE AROM and motor planning. Pt completed task in 5 mins and 17 secs with 8 misses and 5 distractor's. Next pt completed RUE motor planning task of tracing shapes on screen with RUE. Pt traced a triangle, pentagon, cross, circle and moon with 91.98% coverage.  Pt transported back to room with total A where pt completed stand pivot back to bed with Rw and CGA. Supervision for sit>supine. pt left supine in bed with bed alarm activated and all needs within reach.                    ?Therapy Documentation ?Precautions:  ?Precautions ?Precautions: Fall ?Precaution Comments: R hemi, Bil hip weakness, HOH ?Restrictions ?Weight Bearing Restrictions: No ? ?Pain: ?Session 1: no pain reported  ?Session 2: no pain reported  ? ? ? ?Therapy/Group: Individual Therapy ? ?[name] ?12/07/2021, 12:26 PM ?

## 2021-12-08 MED ORDER — METOPROLOL TARTRATE 12.5 MG HALF TABLET
12.5000 mg | ORAL_TABLET | Freq: Every day | ORAL | Status: DC
  Administered 2021-12-09 – 2021-12-12 (×4): 12.5 mg via ORAL
  Filled 2021-12-08 (×4): qty 1

## 2021-12-08 NOTE — Progress Notes (Signed)
?                                                       PROGRESS NOTE ? ? ?Subjective/Complaints: ?Hard of hearing  ?No new complaints today ?Ambulated 129feet yesterday supervision with PT ?No issues overnight ? ?ROS: Patient denies CP, SOB, N/V/D ? ?Objective: ?  ?No results found. ?No results for input(s): WBC, HGB, HCT, PLT in the last 72 hours. ? ?No results for input(s): NA, K, CL, CO2, GLUCOSE, BUN, CREATININE, CALCIUM in the last 72 hours. ? ? ?Intake/Output Summary (Last 24 hours) at 12/08/2021 1356 ?Last data filed at 12/08/2021 1300 ?Gross per 24 hour  ?Intake 560 ml  ?Output --  ?Net 560 ml  ?  ? ?  ? ?Physical Exam: ?Vital Signs ?Blood pressure 99/62, pulse 69, temperature 98.6 ?F (37 ?C), temperature source Oral, resp. rate 19, height 5\' 1"  (1.549 m), weight 41.9 kg, SpO2 100 %. ?Gen: no distress, normal appearing ?HEENT: oral mucosa pink and moist, NCAT ?Cardio: Reg rate ?Chest: normal effort, normal rate of breathing ?Abd: soft, non-distended ?Ext: no edema ?Psych: pleasant, normal affect ? ?Skin: No evidence of breakdown, no evidence of rash ?Neurologic: Cranial nerves II through XII intact, motor strength is 5/5 in left 4+ right  deltoid, bicep, tricep, grip, hip flexor, knee extensors, ankle dorsiflexor and plantar flexor ?Sensory exam normal for light touch and pain in all 4 limbs. No limb ataxia or cerebellar signs. No abnormal tone appreciated.  Mild fine motor deficits RUE  ?Musculoskeletal: Full range of motion in all 4 extremities. Tenderness Left trap, no pain with C spine ROM  ? ? ?Assessment/Plan: ?1. Functional deficits which require 3+ hours per day of interdisciplinary therapy in a comprehensive inpatient rehab setting. ?Physiatrist is providing close team supervision and 24 hour management of active medical problems listed below. ?Physiatrist and rehab team continue to assess barriers to discharge/monitor patient progress toward functional and medical goals ? ?Care Tool: ? ?Bathing ?    ?Body parts bathed by patient: Right arm, Left upper leg  ?   ?  ?  ?Bathing assist Assist Level: Supervision/Verbal cueing ?  ?  ?Upper Body Dressing/Undressing ?Upper body dressing   ?What is the patient wearing?: Pull over shirt ?   ?Upper body assist Assist Level: Independent with assistive device ?   ?Lower Body Dressing/Undressing ?Lower body dressing ? ? ?   ?What is the patient wearing?: Pants, Incontinence brief ? ?  ? ?Lower body assist Assist for lower body dressing: Minimal Assistance - Patient > 75% ?   ? ?Toileting ?Toileting Toileting Activity did not occur and hygiene only):  (condom cath)  ?Toileting assist Assist for toileting: Independent ?  ?  ?Transfers ?Chair/bed transfer ? ?Transfers assist ?   ? ?Chair/bed transfer assist level: Supervision/Verbal cueing ?  ?  ?Locomotion ?Ambulation ? ? ?Ambulation assist ? ?   ? ?Assist level: Supervision/Verbal cueing ?Assistive device: Walker-rolling ?Max distance: 200  ? ?Walk 10 feet activity ? ? ?Assist ?   ? ?Assist level: Supervision/Verbal cueing ?Assistive device: Walker-rolling  ? ?Walk 50 feet activity ? ? ?Assist   ? ?Assist level: Supervision/Verbal cueing ?Assistive device: Walker-rolling  ? ? ?Walk 150 feet activity ? ? ?Assist   ? ?Assist level: Supervision/Verbal cueing ?Assistive device: Walker-rolling ?  ? ?  Walk 10 feet on uneven surface  ?activity ? ? ?Assist Walk 10 feet on uneven surfaces activity did not occur: Safety/medical concerns ? ? ?Assist level: Supervision/Verbal cueing ?Assistive device: Walker-rolling  ? ?Wheelchair ? ? ? ? ?Assist Is the patient using a wheelchair?: No ?Type of Wheelchair: Manual ?  ? ?Wheelchair assist level: Minimal Assistance - Patient > 75% ?Max wheelchair distance: 150  ? ? ?Wheelchair 50 feet with 2 turns activity ? ? ? ?Assist ? ?  ?  ? ? ?Assist Level: Minimal Assistance - Patient > 75%  ? ?Wheelchair 150 feet activity  ? ? ? ?Assist ?   ? ? ?Assist Level: Minimal Assistance -  Patient > 75%  ? ?Blood pressure 99/62, pulse 69, temperature 98.6 ?F (37 ?C), temperature source Oral, resp. rate 19, height 5\' 1"  (1.549 m), weight 41.9 kg, SpO2 100 %. ? ?Medical Problem List and Plan: ?1. 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            -patient may shower ?            -ELOS/Goals: original date 4/7 with sup/CGA, now plan is for ALF on 4/10 or 4/11 ? -Continue CIR therapies including PT, OT, and SLP  ?2.  Antithrombotics: ?-DVT/anticoagulation:  Pharmaceutical: Lovenox ?            -antiplatelet therapy: Aspirin 325 mg daily and Plavix and 5 mg day x90 days ending 01/07/2022 then monotherapy with aspirin 81 mg thereafter ?3. Pain Management: Tylenol as needed ?Left trap pain, continue bengay, heat ordered - improved 4/6 ?4. Depression: continue Lexapro 20 mg daily.  Psychiatry services consulted ?            -antipsychotic agents: N/A ?5. Neuropsych: This patient he is capable of making decisions on his own behalf. ?6. Skin/Wound Care: Routine skin checks ?7. Fluids/Electrolytes/Nutrition: Routine in and out with follow-up chemistries ?8.  Hypothyroidism.  Synthroid ?9.  Hyperlipidemia.  Continue Crestor ?10.  HTN  ?Vitals:  ? 12/08/21 0409 12/08/21 1329  ?BP: (!) 107/54 99/62  ?Pulse: 60 69  ?Resp: 14 19  ?Temp: 98.4 ?F (36.9 ?C) 98.6 ?F (37 ?C)  ?SpO2: 99% 100%  ?Given hypotension, decrease lopressor to HS ? ?11.  History of alcohol/tobacco abuse.  Now in remission. Pt states it has been 5 yrs since he drank ETOH last ETOH + on tox was 10/2015, denies tobacco use  ?12.  COVID-19 positive.  Completed course of Paxlovid and airborne precautions discontinued 3/26 ?13. ? UTI.  Urinalysis positive nitrite.   ? -4/3 completed keflex ?     -recurrent urgency/frequency ?    -repeat UA neg, likely CVA related  ?14. Underweight: BMI 17.45: provide dietary education. Pt c/o poor appetite, add Megace now eating 100% of  meals  ?  ? ?LOS: ?12 days ?A FACE TO FACE EVALUATION WAS PERFORMED ? ?08-24-1985 P Chaka Jefferys ?12/08/2021, 1:56 PM  ? ? ? ?

## 2021-12-10 LAB — CREATININE, SERUM
Creatinine, Ser: 0.75 mg/dL (ref 0.61–1.24)
GFR, Estimated: >60 mL/min (ref >60)

## 2021-12-10 NOTE — Progress Notes (Signed)
Physical Therapy Session Note ? ?Patient Details  ?Name: Ralph Jackson ?MRN: [id number] ?Date of Birth: 10-28-1945 ? ?Today's Date: 12/10/2021 ?PT Individual Time: 6606-3016 ?PT Individual Time Calculation (min): 27 min  ? ?Short Term Goals: ?Week 2:  PT Short Term Goal 1 (Week 2): STG=LTG due to ELOS ? ? ?Skilled Therapeutic Interventions/Progress Updates:  ?Patient supine in bed on entrance to room. Patient alert and agreeable to PT session.  ? ?Patient with no pain complaint throughout session. ? ?Therapeutic Activity: ?Bed Mobility: Patient performed supine --> sit with supervision. No cueing required for completion. ?Transfers: Patient performed sit<>stand and stand pivot transfers throughout session with close supervision. No cueing required for performance. Toilet transfer completed with CGA and MaxA for doffing/ donning brief. Allowed to dress while seated EOB and requires ModA to complete donning pants as limited strength/ joint mobility has pt attempting to put both LE's in one pant leg.  ? ?Gait Training:  ?Patient ambulated short distances in room using RW with CGA. Demonstrated catch of RW on tray table leg and on doorway to bathroom. MinA provided for balance while pt corrects walker placement. Provided vc/ tc for problenm solving. ? ? ?Patient seated upright  in w/c at end of session with brakes locked in Day Room and preparing for group exercise session, and all needs within reach. ? ?Unknown by end of session re: discharge location or date.  ? ?Therapy Documentation ?Precautions:  ?Precautions ?Precautions: Fall ?Precaution Comments: R hemi, Bil hip weakness, HOH ?Restrictions ?Weight Bearing Restrictions: No ?General: ?  ?Vital Signs: ?  ?Pain: ?Pain Assessment ?Pain Scale: 0-10 ?Pain Score: 0-No pain ? ?Therapy/Group: Individual Therapy ? ?Loel Dubonnet PT, DPT ?12/10/2021, 11:00 AM  ?

## 2021-12-10 NOTE — Progress Notes (Addendum)
Inpatient Rehabilitation Discharge Medication Review by a Pharmacist ? ?A complete drug regimen review was completed for this patient to identify any potential clinically significant medication issues. ? ?High Risk Drug Classes Is patient taking? Indication by Medication  ?Antipsychotic No   ?Anticoagulant No   ?Antibiotic No   ?Opioid No   ?Antiplatelet Yes ASA 325, plavix 75 - stroke prevention  ?Hypoglycemics/insulin No   ?Vasoactive Medication Yes Lopressor - HTN  ?Chemotherapy No   ?Other Yes Crestor - HLD ?Synthroid - Hypothyroidism ?Prilosec - GERD ?Lexapro - mood disorder ?Prolia - osteoporosis  ?Claritin - allergies ?MIV - supplementation  ? ? ? ?Type of Medication Issue Identified Description of Issue Recommendation(s)  ?Drug Interaction(s) (clinically significant) ?    ?Duplicate Therapy ?    ?Allergy ?    ?No Medication Administration End Date ?    ?Incorrect Dose ?    ?Additional Drug Therapy Needed ?    ?Significant med changes from prior encounter (inform family/care partners about these prior to discharge).    ?Other ?    ? ? ?Clinically significant medication issues were identified that warrant physician communication and completion of prescribed/recommended actions by midnight of the next day:  No ? ? ?Time spent performing this drug regimen review (minutes):  30 ? ? ?Lavonia Eager BS, PharmD, BCPS ?Clinical Pharmacist ?12/13/2021 10:11 AM ?

## 2021-12-10 NOTE — Progress Notes (Signed)
Occupational Therapy Session Note ? ?Patient Details  ?Name: Ralph Jackson ?MRN: [id number] ?Date of Birth: 05/07/1946 ? ?Today's Date: 12/10/2021 ?OT Group Time: 1100-1200 ?OT Group Time Calculation (min): 60 min ? ? ?Short Term Goals: ?Week 2:  OT Short Term Goal 1 (Week 2): STG = LTG 2/2 ELOS ? ?Skilled Therapeutic Interventions/Progress Updates:  ?Pt participated in group session with a focus on BUE strength and endurance/standing balance to facilitate improved activity tolerance and strength for higher level BADLs and functional mobility tasks. Pt first in engaged in therapeutic activity of playing corn hole in teams, pt able to ambulate to starting line with Rw and CGA. Pt tossed bags with RUE and overall CGA. Pt needed MIN verbal cues for RW mgmt and safety awareness during sit>stand as attempting to sit prematurely in w/c. ?Pt engaged in seated therapeutic activity game where pts were instructed to roll large dice to see what number they rolled, once a number was determined each number correlated to an UB exercise and a number of reps, exercises included bicep curls, tricep extensions, upright rows, flys, chest presses and punches. Repetitions ranged from 4-20. ?Pt chose to use 2lb weights during session. Education provided during activity of various modifications for all exercises. Education provided on the importance of deep breathing as well as determining each pts activity tolerance. Pt needed MAX hand over hand cues at time for correct body mechanics during therex. ?Ended session with guide deep breathing for 1 min 30 secs. Discussed benefits of deep breathing to manage stress and pain. Pt transported back to room by this OTA where pt completed stand pivot back to recliner with RW and CGA. Pt left seated in recliner with safety belt activated and all needs within reach.  ? ?Therapy Documentation ?Precautions:  ?Precautions ?Precautions: Fall ?Precaution Comments: R hemi, Bil hip weakness,  HOH ?Restrictions ?Weight Bearing Restrictions: No ? ?  ?Pain: no pain reported during session  ? ?Therapy/Group: Group Therapy ? ?[name] ?12/10/2021, 12:58 PM ?

## 2021-12-10 NOTE — [place] FL2 (Addendum)
?[place] MEDICAID FL2 LEVEL OF CARE SCREENING TOOL  ?  ? ?IDENTIFICATION  ?Patient Name: ?Ralph Jackson Birthdate: 01/11/1946 Sex: male Admission Date (Current Location): ?11/26/2021  ?[place] Number: ? [place] ?[id number] n Facility and Address:  ?[facility], [address] [place], [place] [address] ?     Provider Number: ?   ?Attending Physician Name and Address:  ?Erick Colace, MD ? Relative Name and Phone Number:  ?Jennifier [phone] ?   ?Current Level of Care: ?Hospital Recommended Level of Care: ?Assisted Living Facility Prior Approval Number: ? [id number] ? ?Date Approved/Denied: ?  PASRR Number: ?  ? ?Discharge Plan: ?Other (Comment) (ALF) ?  ? ?Current Diagnoses: ?Patient Active Problem List  ? Diagnosis Date Noted  ? 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 Depression with anxiety 11/19/2021  ? COVID-19 virus infection 11/16/2021  ? CVA (cerebral vascular accident) (HCC) 11/15/2021  ? Hypothyroidism 11/15/2021  ? GERD (gastroesophageal reflux disease) 11/15/2021  ? Tobacco abuse 11/15/2021  ? HLD (hyperlipidemia) 11/15/2021  ? Pulmonary edema 06/28/2021  ? Acute respiratory failure with hypoxia (HCC)   ? Closed displaced fracture of right femoral neck (HCC) 06/19/2021  ? 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 HTN (hypertension) 06/19/2021  ? Protein-calorie malnutrition, severe (HCC) 07/08/2014  ? TSH elevation 07/08/2014  ? FTT (failure to thrive) in adult 07/08/2014  ? Alcohol dependence with alcohol-induced mood disorder (HCC)   ? Alcohol abuse 07/05/2014  ? Alcohol dependence (HCC) 04/04/2014  ? ? ?Orientation RESPIRATION BLADDER Height & Weight   ?  ?Self, Time, Situation ? Normal Incontinent Weight: 92 lb 6 oz (41.9 kg) ?Height:  5\' 1"  (154.9 cm)  ?BEHAVIORAL SYMPTOMS/MOOD NEUROLOGICAL BOWEL NUTRITION STATUS  ?    Incontinent Diet  ?AMBULATORY STATUS COMMUNICATION OF NEEDS Skin   ?  Verbally  Normal ?  ?  ?  ?    ?     ?     ? ? ?Personal Care Assistance Level of Assistance  ?Bathing, Feeding, Dressing Bathing Assistance: Limited assistance ?Feeding assistance: Limited assistance ?Dressing Assistance: Limited assistance ?   ? ?Functional Limitations Info  ?    ?  ?   ? ? ?SPECIAL CARE FACTORS FREQUENCY  ?PT (By licensed PT), OT (By licensed OT), Speech therapy   ?  ?  ?  ?  ?  ?  ?   ? ? ?Contractures    ? ? ?Additional Factors Info  ?    ?  ?  ?  ?  ?   ? ?Current Medications (12/10/2021):  This is the current hospital active medication list ?Current Facility-Administered Medications  ?Medication Dose Route Frequency Provider Last Rate Last Admin  ? acetaminophen (TYLENOL) tablet 650 mg  650 mg Oral Q4H PRN Angiulli, 02/09/2022, PA-C      ? Or  ? acetaminophen (TYLENOL) 160 MG/5ML solution 650 mg  650 mg Per Tube Q4H PRN Angiulli, Mcarthur Rossetti, PA-C      ? Or  ? acetaminophen (TYLENOL) suppository 650 mg  650 mg Rectal Q4H PRN Angiulli, Mcarthur Rossetti, PA-C      ? aspirin EC tablet 325 mg  325 mg Oral Daily Mcarthur Rossetti, PA-C   325 mg at 12/10/21 0840  ? clopidogrel (PLAVIX) tablet 75 mg  75 mg Oral Daily Reome, Earle J, RPH   75 mg at 12/10/21 0840  ? enoxaparin (LOVENOX) injection 30 mg  30 mg  Subcutaneous Q24H Charlton Amor, PA-C   30 mg at 12/09/21 2040  ? escitalopram (LEXAPRO) tablet 20 mg  20 mg Oral Daily Charlton Amor, PA-C   20 mg at 12/10/21 0840  ? feeding supplement (ENSURE ENLIVE / ENSURE PLUS) liquid 237 mL  237 mL Oral BID BM Charlton Amor, PA-C   237 mL at 12/10/21 1347  ? levothyroxine (SYNTHROID) tablet 50 mcg  50 mcg Oral Daily Charlton Amor, PA-C   50 mcg at 12/10/21 9924  ? megestrol (MEGACE) 400 MG/10ML suspension 400 mg  400 mg Oral BID Erick Colace, MD   400 mg at 12/10/21 0840  ? metoprolol tartrate (LOPRESSOR) tablet 12.5 mg  12.5 mg Oral QHS Raulkar, Drema Pry, MD   12.5 mg at 12/09/21 2040  ? multivitamin with minerals tablet 1 tablet  1 tablet Oral Daily  Charlton Amor, PA-C   1 tablet at 12/10/21 0840  ? Muscle Rub CREA   Topical PRN Erick Colace, MD   Given at 12/06/21 2137  ? pantoprazole (PROTONIX) EC tablet 40 mg  40 mg Oral Daily Charlton Amor, PA-C   40 mg at 12/10/21 0840  ? rosuvastatin (CRESTOR) tablet 20 mg  20 mg Oral Daily Charlton Amor, PA-C   20 mg at 12/10/21 0840  ? ? ? ?Discharge Medications: ?Please see discharge summary for a list of discharge medications. ? ?Relevant Imaging Results: ? ?Relevant Lab Results: ? ? ?Additional Information ?  ? ?Andria Rhein ? ? ? ? ?

## 2021-12-10 NOTE — Progress Notes (Signed)
?                                                       PROGRESS NOTE ? ? ?Subjective/Complaints: ? ?No new issues , mild Left side neck pain- pt asking about ALF ? ?ROS: Patient denies CP, SOB, N/V/D ? ?Objective: ?  ?No results found. ?No results for input(s): WBC, HGB, HCT, PLT in the last 72 hours. ? ?Recent Labs  ?  12/10/21 ?4481  ?CREATININE 0.75  ? ? ? ?Intake/Output Summary (Last 24 hours) at 12/10/2021 0825 ?Last data filed at 12/09/2021 2040 ?Gross per 24 hour  ?Intake 564 ml  ?Output 350 ml  ?Net 214 ml  ? ?  ? ?  ? ?Physical Exam: ?Vital Signs ?Blood pressure 117/80, pulse 67, temperature 98.4 ?F (36.9 ?C), temperature source Oral, resp. rate 16, height 5\' 1"  (1.549 m), weight 41.9 kg, SpO2 99 %. ?Gen: no distress, normal appearing ? ?General: No acute distress ?Mood and affect are appropriate ?Heart: Regular rate and rhythm no rubs murmurs or extra sounds ?Lungs: Clear to auscultation, breathing unlabored, no rales or wheezes ?Abdomen: Positive bowel sounds, soft nontender to palpation, nondistended ?Extremities: No clubbing, cyanosis, or edema ? ? ? ?Skin: No evidence of breakdown, no evidence of rash ?Neurologic: Cranial nerves II through XII intact, motor strength is 5/5 in left 4+ right  deltoid, bicep, tricep, grip, hip flexor, knee extensors, ankle dorsiflexor and plantar flexor ?Sensory exam normal for light touch and pain in all 4 limbs. No limb ataxia or cerebellar signs. No abnormal tone appreciated.  Mild fine motor deficits RUE  ?Musculoskeletal: Full range of motion in all 4 extremities. Tenderness Left trap, no pain with C spine ROM  ? ? ?Assessment/Plan: ?1. Functional deficits which require 3+ hours per day of interdisciplinary therapy in a comprehensive inpatient rehab setting. ?Physiatrist is providing close team supervision and 24 hour management of active medical problems listed below. ?Physiatrist and rehab team continue to assess barriers to discharge/monitor patient progress toward  functional and medical goals ? ?Care Tool: ? ?Bathing ?   ?Body parts bathed by patient: Right arm, Left upper leg  ?   ?  ?  ?Bathing assist Assist Level: Supervision/Verbal cueing ?  ?  ?Upper Body Dressing/Undressing ?Upper body dressing   ?What is the patient wearing?: Pull over shirt ?   ?Upper body assist Assist Level: Independent with assistive device ?   ?Lower Body Dressing/Undressing ?Lower body dressing ? ? ?   ?What is the patient wearing?: Pants, Incontinence brief ? ?  ? ?Lower body assist Assist for lower body dressing: Minimal Assistance - Patient > 75% ?   ? ?Toileting ?Toileting Toileting Activity did not occur and hygiene only):  (condom cath)  ?Toileting assist Assist for toileting: Independent ?  ?  ?Transfers ?Chair/bed transfer ? ?Transfers assist ?   ? ?Chair/bed transfer assist level: Supervision/Verbal cueing ?  ?  ?Locomotion ?Ambulation ? ? ?Ambulation assist ? ?   ? ?Assist level: Supervision/Verbal cueing ?Assistive device: Walker-rolling ?Max distance: 200  ? ?Walk 10 feet activity ? ? ?Assist ?   ? ?Assist level: Supervision/Verbal cueing ?Assistive device: Walker-rolling  ? ?Walk 50 feet activity ? ? ?Assist   ? ?Assist level: Supervision/Verbal cueing ?Assistive device: Walker-rolling  ? ? ?Walk 150 feet  activity ? ? ?Assist   ? ?Assist level: Supervision/Verbal cueing ?Assistive device: Walker-rolling ?  ? ?Walk 10 feet on uneven surface  ?activity ? ? ?Assist Walk 10 feet on uneven surfaces activity did not occur: Safety/medical concerns ? ? ?Assist level: Supervision/Verbal cueing ?Assistive device: Walker-rolling  ? ?Wheelchair ? ? ? ? ?Assist Is the patient using a wheelchair?: No ?Type of Wheelchair: Manual ?  ? ?Wheelchair assist level: Minimal Assistance - Patient > 75% ?Max wheelchair distance: 150  ? ? ?Wheelchair 50 feet with 2 turns activity ? ? ? ?Assist ? ?  ?  ? ? ?Assist Level: Minimal Assistance - Patient > 75%  ? ?Wheelchair 150 feet activity   ? ? ? ?Assist ?   ? ? ?Assist Level: Minimal Assistance - Patient > 75%  ? ?Blood pressure 117/80, pulse 67, temperature 98.4 ?F (36.9 ?C), temperature source Oral, resp. rate 16, height 5\' 1"  (1.549 m), weight 41.9 kg, SpO2 99 %. ? ?Medical Problem List and Plan: ?1. 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            -patient may shower ?            -ELOS/Goals: original date 4/7 with sup/CGA, now plan is for ALF on 4/10 or 4/11 ? -Continue CIR therapies including PT, OT, and SLP  ?2.  Antithrombotics: ?-DVT/anticoagulation:  Pharmaceutical: Lovenox ?            -antiplatelet therapy: Aspirin 325 mg daily and Plavix and 5 mg day x90 days ending 01/07/2022 then monotherapy with aspirin 81 mg thereafter ?3. Pain Management: Tylenol as needed ?Left trap pain, continue bengay, heat ordered - improved 4/6 ?4. Depression: continue Lexapro 20 mg daily.  Psychiatry services consulted ?            -antipsychotic agents: N/A ?5. Neuropsych: This patient he is capable of making decisions on his own behalf. ?6. Skin/Wound Care: Routine skin checks ?7. Fluids/Electrolytes/Nutrition: Routine in and out with follow-up chemistries ?8.  Hypothyroidism.  Synthroid ?9.  Hyperlipidemia.  Continue Crestor ?10.  HTN  ?Vitals:  ? 12/09/21 2039 12/10/21 0522  ?BP: 113/72 117/80  ?Pulse: 73 67  ?Resp: 14 16  ?Temp: 98.7 ?F (37.1 ?C) 98.4 ?F (36.9 ?C)  ?SpO2: 99% 99%  ?Given hypotension, decrease lopressor to HS ? ?11.  History of alcohol/tobacco abuse.  Now in remission. Pt states it has been 5 yrs since he drank ETOH last ETOH + on tox was 10/2015, denies tobacco use  ?12.  COVID-19 positive.  Completed course of Paxlovid and airborne precautions discontinued 3/26 ?13. ? UTI.  Urinalysis positive nitrite.   ? -4/3 completed keflex ?     -recurrent urgency/frequency ?    -repeat UA neg, likely CVA related  ?14. Underweight: BMI 17.45: provide dietary  education. Pt c/o poor appetite, add Megace now eating 100% of meals  ?  ? ?LOS: ?14 days ?A FACE TO FACE EVALUATION WAS PERFORMED ? ?08-24-1985 Wafa Martes ?12/10/2021, 8:25 AM  ? ? ? ?

## 2021-12-10 NOTE — Progress Notes (Signed)
Occupational Therapy Session Note ? ?Patient Details  ?Name: Ralph Jackson ?MRN: [id number] ?Date of Birth: [birth year] ? ?Today's Date: 12/10/2021 ?OT Individual Time: 0623-7628 ?OT Individual Time Calculation (min): 83 min  ? ? ?Short Term Goals: ?Week 2:  OT Short Term Goal 1 (Week 2): STG = LTG 2/2 ELOS ? ?Skilled Therapeutic Interventions/Progress Updates:  ?Skilled OT intervention completed with focus on functional endurance, shower transfers and BUE strengthening. Pt received seated in recliner. Begrudgingly agreeable to shower this session 2/2 to allotted time but needed max encouragement. Completed all transfers at [facility] to supervision level via sit <> stands and ambulation with RW. Pt was continent of bladder, with small smear noted in brief. Doffed pants in standing with CGA needed for unilateral LE supported, with min A needed for threading. Transferred onto tub bench with supervision. Completed shower with supervision/cues for safety and accessing all parts at the sit > stand level with supervision for balance with pt using grab bars for balance. Donned clothing at the sit > stand level all with supervision. Cues needed for safety when sitting as pt attempted to sit on end of bench on several occassions with bottom halfway seated on seat. Therapist removed heel bandages to allow for skin breathing and to maintain skin integrity, with therapist confirming with nurse it was okay to leave off. Pt donned socks with supervision. Standing at sink with supervision, pt completed hair grooming for functional endurance. Cues needed for RW positioning vs pushing it off to the side. Seated in recliner, pt completed the following exercises, adhering to pt's HEP provided to promote strength needed for functional tasks as well as maximizing pt's independence with HEP completion: ? ?(With yellow theraband) 10 reps ?Self-anchored shoulder flexion each arm ?Self-anchored bicep flexion each arm ?Bruegger pulls  ?Flexion  serratus activation overhead ?(Added in for continued strength)- ?Horizontal shoulder abduction ?Alternating chest presses ?External rotation of shoulder ? ?Pt completed supervised chair to bed transfer and bed mobility, with pt left upright in bed per request, with bed alarm on, fresh drink per request and all needs in reach at end of session. ? ? ?Therapy Documentation ?Precautions:  ?Precautions ?Precautions: Fall ?Precaution Comments: R hemi, Bil hip weakness, HOH ?Restrictions ?Weight Bearing Restrictions: No ? ?Pain: ?No c/o pain ? ? ?Therapy/Group: Individual Therapy ? ?Ayah Cozzolino E Laurabelle Gorczyca ?12/10/2021, 7:39 AM ?

## 2021-12-11 ENCOUNTER — Other Ambulatory Visit (HOSPITAL_COMMUNITY): Payer: Self-pay

## 2021-12-11 MED ORDER — METOPROLOL TARTRATE 25 MG PO TABS
12.5000 mg | ORAL_TABLET | Freq: Every evening | ORAL | 0 refills | Status: AC
  Filled 2021-12-11: qty 50, 100d supply, fill #0
  Filled 2021-12-12: qty 30, 60d supply, fill #0

## 2021-12-11 NOTE — Progress Notes (Deleted)
Patient ID: Ralph Jackson, male   DOB: [birth year], 76 y.o.   MRN: [id number] ? ?Wheelchair and L Platform RW ordered through [employer].  ?

## 2021-12-11 NOTE — Progress Notes (Signed)
Occupational Therapy Session Note ? ?Patient Details  ?Name: Ralph Jackson ?MRN: [id number] ?Date of Birth: Apr 18, 1946 ? ?Today's Date: 12/11/2021 ?OT Missed Time: 60 Minutes ?Missed Time Reason: Patient fatigue ? ? ?Short Term Goals: ?Week 1:  OT Short Term Goal 1 (Week 1): STG = LTG 2/2 ELOS ? ?Skilled Therapeutic Interventions/Progress Updates:  ? Attempted to see pt for unscheduled therapy visit. Pt received in bed and politely declining OOB activity at this time so he can rest. Continued to decline after offered ADL/OOB activity. Pt left semi-reclined in bed, all needs met, call bell in reach, bed alarm engaged. ? ? ?Therapy Documentation ?Precautions:  ?Precautions ?Precautions: Fall ?Precaution Comments: R hemi, Bil hip weakness, HOH ?Restrictions ?Weight Bearing Restrictions: No ? ? ?Volanda Napoleon MS, OTR/L ? ?12/11/2021, 12:55 PM ?

## 2021-12-11 NOTE — Progress Notes (Signed)
?                                                       PROGRESS NOTE ? ? ?Subjective/Complaints: ? ?FL 2 went out yesterday , son in law out of hospital but daughter cannot care for 2 people according to pt ? ?ROS: Patient denies CP, SOB, N/V/D ? ?Objective: ?  ?No results found. ?No results for input(s): WBC, HGB, HCT, PLT in the last 72 hours. ? ?Recent Labs  ?  12/10/21 [id number]  ?CREATININE 0.75  ? ? ? ? ?Intake/Output Summary (Last 24 hours) at 12/11/2021 0741 ?Last data filed at 12/10/2021 2120 ?Gross per 24 hour  ?Intake 516 ml  ?Output --  ?Net 516 ml  ? ?  ? ?  ? ?Physical Exam: ?Vital Signs ?Blood pressure 116/75, pulse 65, temperature 98.2 ?F (36.8 ?C), temperature source Oral, resp. rate 16, height 5\' 1"  (1.549 m), weight 41.9 kg, SpO2 100 %. ?Gen: no distress, normal appearing ? ?General: No acute distress ?Mood and affect are appropriate ?Heart: Regular rate and rhythm no rubs murmurs or extra sounds ?Lungs: Clear to auscultation, breathing unlabored, no rales or wheezes ?Abdomen: Positive bowel sounds, soft nontender to palpation, nondistended ?Extremities: No clubbing, cyanosis, or edema ? ? ?Skin: No evidence of breakdown, no evidence of rash ?Neurologic: Cranial nerves II through XII intact, motor strength is 5/5 in left 4+ right  deltoid, bicep, tricep, grip, hip flexor, knee extensors, ankle dorsiflexor and plantar flexor ?Sensory exam normal for light touch and pain in all 4 limbs. No limb ataxia or cerebellar signs. No abnormal tone appreciated.  Mild fine motor deficits RUE  ?Musculoskeletal: Full range of motion in all 4 extremities. Tenderness Left trap, no pain with C spine ROM  ? ? ?Assessment/Plan: ?1. Functional deficits which require 3+ hours per day of interdisciplinary therapy in a comprehensive inpatient rehab setting. ?Physiatrist is providing close team supervision and 24 hour management of active medical problems listed below. ?Physiatrist and rehab team continue to assess barriers  to discharge/monitor patient progress toward functional and medical goals ? ?Care Tool: ? ?Bathing ?   ?Body parts bathed by patient: Right arm, Left upper leg  ?   ?  ?  ?Bathing assist Assist Level: Supervision/Verbal cueing ?  ?  ?Upper Body Dressing/Undressing ?Upper body dressing   ?What is the patient wearing?: Pull over shirt ?   ?Upper body assist Assist Level: Independent with assistive device ?   ?Lower Body Dressing/Undressing ?Lower body dressing ? ? ?   ?What is the patient wearing?: Pants, Incontinence brief ? ?  ? ?Lower body assist Assist for lower body dressing: Minimal Assistance - Patient > 75% ?   ? ?Toileting ?Toileting Toileting Activity did not occur Landscape architect and hygiene only):  (condom cath)  ?Toileting assist Assist for toileting: Independent ?  ?  ?Transfers ?Chair/bed transfer ? ?Transfers assist ?   ? ?Chair/bed transfer assist level: Supervision/Verbal cueing ?  ?  ?Locomotion ?Ambulation ? ? ?Ambulation assist ? ?   ? ?Assist level: Supervision/Verbal cueing ?Assistive device: Walker-rolling ?Max distance: 200  ? ?Walk 10 feet activity ? ? ?Assist ?   ? ?Assist level: Supervision/Verbal cueing ?Assistive device: Walker-rolling  ? ?Walk 50 feet activity ? ? ?Assist   ? ?Assist level: Supervision/Verbal cueing ?Assistive  device: Walker-rolling  ? ? ?Walk 150 feet activity ? ? ?Assist   ? ?Assist level: Supervision/Verbal cueing ?Assistive device: Walker-rolling ?  ? ?Walk 10 feet on uneven surface  ?activity ? ? ?Assist Walk 10 feet on uneven surfaces activity did not occur: Safety/medical concerns ? ? ?Assist level: Supervision/Verbal cueing ?Assistive device: Walker-rolling  ? ?Wheelchair ? ? ? ? ?Assist Is the patient using a wheelchair?: No ?Type of Wheelchair: Manual ?  ? ?Wheelchair assist level: Minimal Assistance - Patient > 75% ?Max wheelchair distance: 150  ? ? ?Wheelchair 50 feet with 2 turns activity ? ? ? ?Assist ? ?  ?  ? ? ?Assist Level: Minimal Assistance -  Patient > 75%  ? ?Wheelchair 150 feet activity  ? ? ? ?Assist ?   ? ? ?Assist Level: Minimal Assistance - Patient > 75%  ? ?Blood pressure 116/75, pulse 65, temperature 98.2 ?F (36.8 ?C), temperature source Oral, resp. rate 16, height 5\' 1"  (1.549 m), weight 41.9 kg, SpO2 100 %. ? ?Medical Problem List and Plan: ?1. 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            -patient may shower ?            -ELOS/Goals: original date 4/7 with sup/CGA, now plan is for ALF/SNF ? -Continue CIR therapies including PT, OT, and SLP  ?2.  Antithrombotics: ?-DVT/anticoagulation:  Pharmaceutical: Lovenox ?            -antiplatelet therapy: Aspirin 325 mg daily and Plavix and 5 mg day x90 days ending 01/07/2022 then monotherapy with aspirin 81 mg thereafter ?3. Pain Management: Tylenol as needed ?Left trap pain, continue bengay, heat ordered - improved 4/6 ?4. Depression: continue Lexapro 20 mg daily.  Psychiatry services consulted ?            -antipsychotic agents: N/A ?5. Neuropsych: This patient he is capable of making decisions on his own behalf. ?6. Skin/Wound Care: Routine skin checks ?7. Fluids/Electrolytes/Nutrition: Routine in and out with follow-up chemistries ?8.  Hypothyroidism.  Synthroid ?9.  Hyperlipidemia.  Continue Crestor ?10.  HTN  ?Vitals:  ? 12/10/21 2124 12/11/21 0657  ?BP:  116/75  ?Pulse: 74 65  ?Resp:  16  ?Temp:  98.2 ?F (36.8 ?C)  ?SpO2:  100%  ?Given hypotension, decrease lopressor to HS ? ?11.  History of alcohol/tobacco abuse.  Now in remission. Pt states it has been 5 yrs since he drank ETOH last ETOH + on tox was 10/2015, denies tobacco use  ?12.  COVID-19 positive.  Completed course of Paxlovid and airborne precautions discontinued 3/26 ?13. ? UTI.  Urinalysis positive nitrite.   ? -4/3 completed keflex ?     -recurrent urgency/frequency ?    -repeat UA neg, likely CVA related  ?14. Underweight: BMI 17.45: provide  dietary education. Pt c/o poor appetite, add Megace now eating 100% of meals  ?  ? ?LOS: ?15 days ?A FACE TO FACE EVALUATION WAS PERFORMED ? ?Luanna Salk Eutha Cude ?12/11/2021, 7:41 AM  ? ? ? ?

## 2021-12-12 ENCOUNTER — Inpatient Hospital Stay (HOSPITAL_COMMUNITY): Payer: Medicare Other

## 2021-12-12 ENCOUNTER — Other Ambulatory Visit (HOSPITAL_COMMUNITY): Payer: Self-pay

## 2021-12-12 IMAGING — DX DG CHEST 1V PORT
1 series · 1 of 1 positions shown · non-contrast
Comparison: Chest radiograph dated [DATE]

CLINICAL DATA: Cough

EXAM:
PORTABLE CHEST 1 VIEW

[chest]
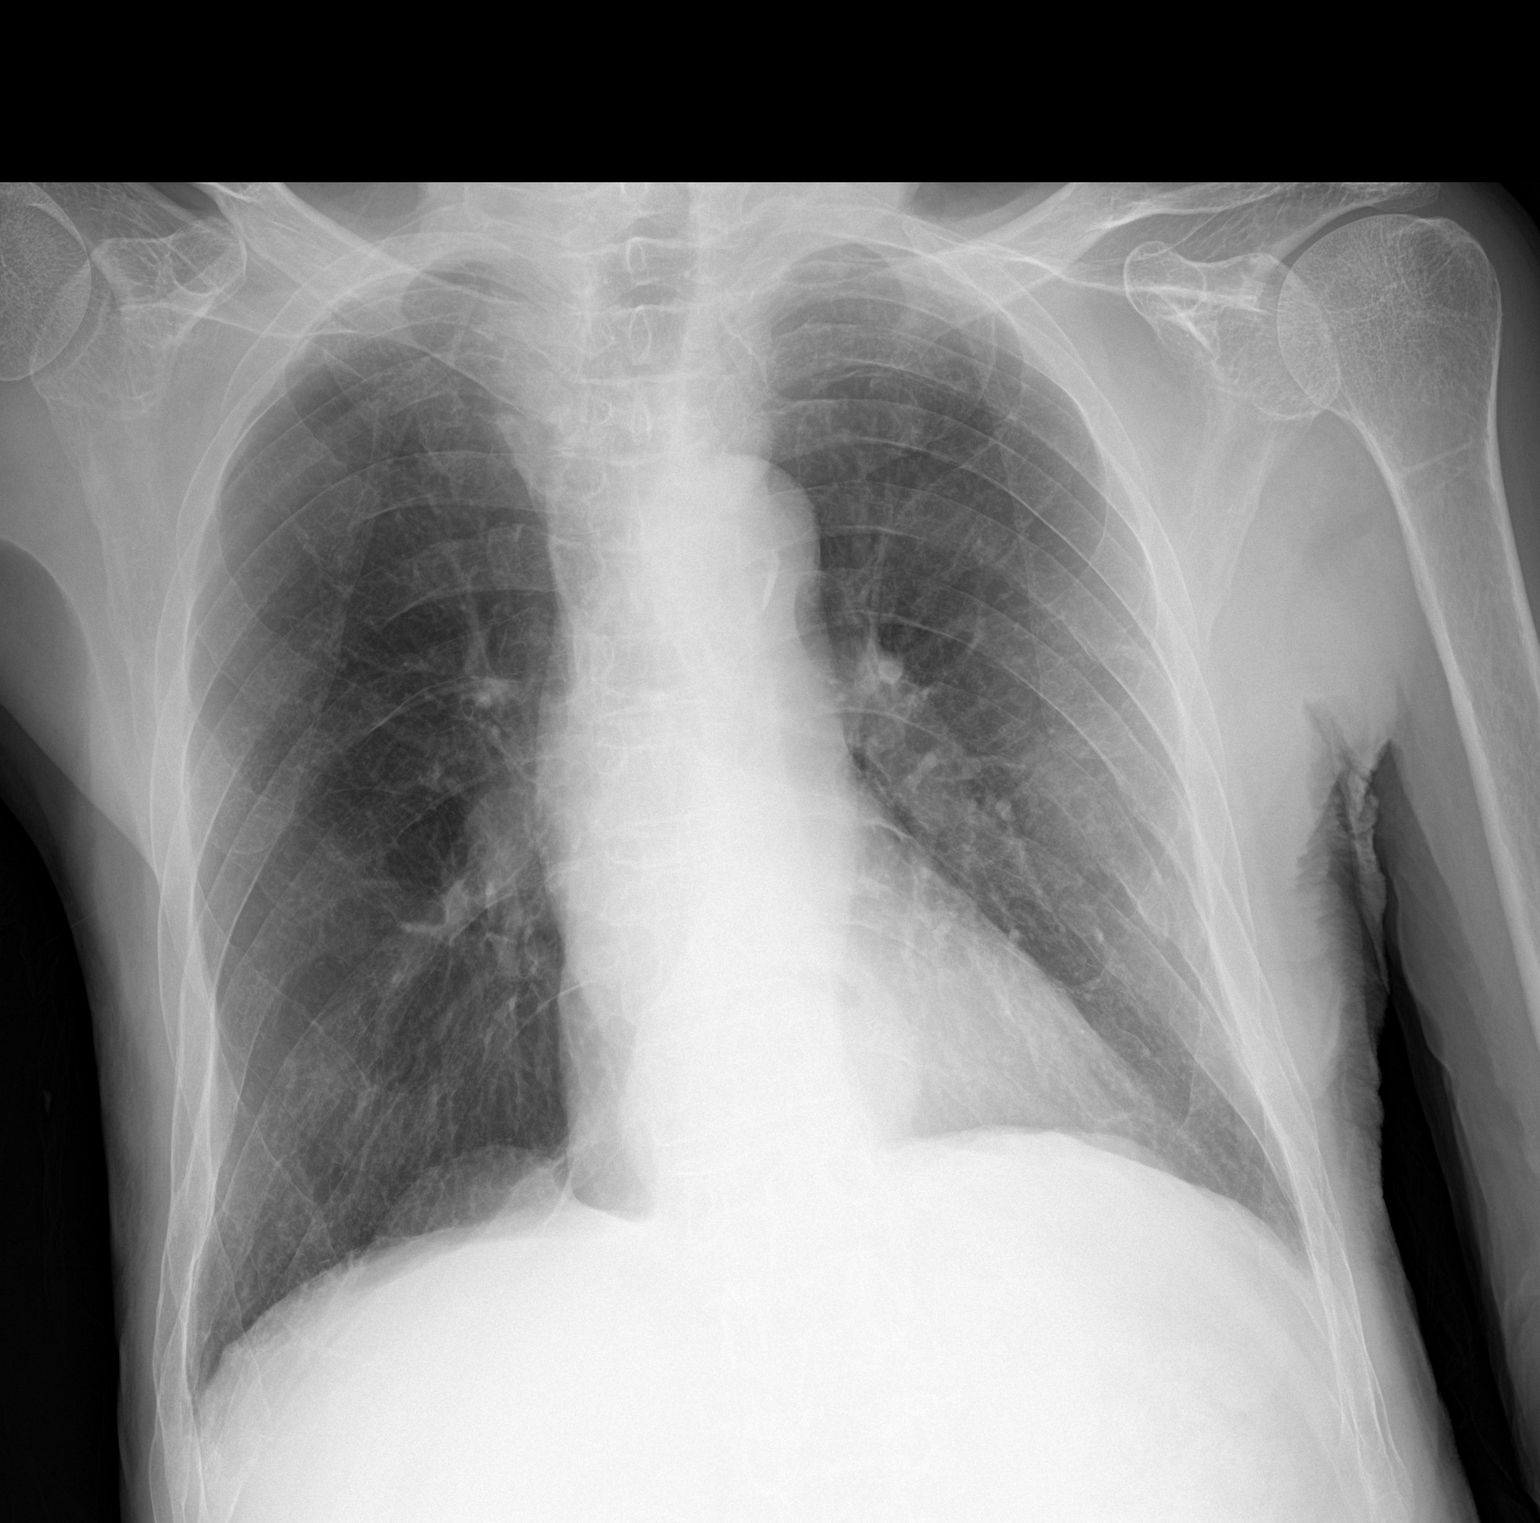

[1 of 1 positions shown; findings below may reference images not displayed]

FINDINGS: The heart size and mediastinal contours are within normal limits.
Atherosclerotic calcification of the aortic arch. Both lungs are
clear. The visualized skeletal structures are unremarkable.
IMPRESSION: No active disease.

## 2021-12-12 NOTE — Plan of Care (Signed)
?  Problem: RH Dressing ?Goal: LTG Patient will perform lower body dressing w/assist (OT) ?Description: LTG: Patient will perform lower body dressing with assist, with/without cues in positioning using equipment (OT) ?Outcome: Not Met (add Reason) ?Note: Continues to require up to min A at time to thread RLE, manage incontinence. ?  ?

## 2021-12-12 NOTE — Progress Notes (Signed)
?                                                       PROGRESS NOTE ? ? ?Subjective/Complaints: ? ?Accepted by [facility]  ? ?ROS: Patient denies CP, SOB, N/V/D ? ?Objective: ?  ?No results found. ?No results for input(s): WBC, HGB, HCT, PLT in the last 72 hours. ? ?Recent Labs  ?  12/10/21 ?3710  ?CREATININE 0.75  ? ? ? ? ?Intake/Output Summary (Last 24 hours) at 12/12/2021 0947 ?Last data filed at 12/12/2021 0700 ?Gross per 24 hour  ?Intake 656 ml  ?Output --  ?Net 656 ml  ? ?  ? ?  ? ?Physical Exam: ?Vital Signs ?Blood pressure 112/63, pulse 62, temperature 98.5 ?F (36.9 ?C), temperature source Oral, resp. rate 18, height 5\' 1"  (1.549 m), weight 41.9 kg, SpO2 99 %. ?Gen: no distress, normal appearing ? ?General: No acute distress ?Mood and affect are appropriate ?Heart: Regular rate and rhythm no rubs murmurs or extra sounds ?Lungs: Clear to auscultation, breathing unlabored, no rales or wheezes ?Abdomen: Positive bowel sounds, soft nontender to palpation, nondistended ?Extremities: No clubbing, cyanosis, or edema ? ? ?Skin: No evidence of breakdown, no evidence of rash ?Neurologic: Cranial nerves II through XII intact, motor strength is 5/5 in left 4+ right  deltoid, bicep, tricep, grip, hip flexor, knee extensors, ankle dorsiflexor and plantar flexor ?Sensory exam normal for light touch and pain in all 4 limbs. No limb ataxia or cerebellar signs. No abnormal tone appreciated.  Mild fine motor deficits RUE  ?Musculoskeletal: Full range of motion in all 4 extremities. Tenderness Left trap, no pain with C spine ROM  ? ? ?Assessment/Plan: ?1. Functional deficits which require 3+ hours per day of interdisciplinary therapy in a comprehensive inpatient rehab setting. ?Physiatrist is providing close team supervision and 24 hour management of active medical problems listed below. ?Physiatrist and rehab team continue to assess barriers to discharge/monitor patient progress toward functional and medical goals ? ?Care  Tool: ? ?Bathing ?   ?Body parts bathed by patient: Right arm, Left upper leg  ?   ?  ?  ?Bathing assist Assist Level: Supervision/Verbal cueing ?  ?  ?Upper Body Dressing/Undressing ?Upper body dressing   ?What is the patient wearing?: Pull over shirt ?   ?Upper body assist Assist Level: Independent with assistive device ?   ?Lower Body Dressing/Undressing ?Lower body dressing ? ? ?   ?What is the patient wearing?: Pants, Incontinence brief ? ?  ? ?Lower body assist Assist for lower body dressing: Minimal Assistance - Patient > 75% ?   ? ?Toileting ?Toileting Toileting Activity did not occur and hygiene only):  (condom cath)  ?Toileting assist Assist for toileting: Independent ?  ?  ?Transfers ?Chair/bed transfer ? ?Transfers assist ?   ? ?Chair/bed transfer assist level: Supervision/Verbal cueing ?  ?  ?Locomotion ?Ambulation ? ? ?Ambulation assist ? ?   ? ?Assist level: Supervision/Verbal cueing ?Assistive device: Walker-rolling ?Max distance: 200  ? ?Walk 10 feet activity ? ? ?Assist ?   ? ?Assist level: Supervision/Verbal cueing ?Assistive device: Walker-rolling  ? ?Walk 50 feet activity ? ? ?Assist   ? ?Assist level: Supervision/Verbal cueing ?Assistive device: Walker-rolling  ? ? ?Walk 150 feet activity ? ? ?Assist   ? ?Assist level:  Supervision/Verbal cueing ?Assistive device: Walker-rolling ?  ? ?Walk 10 feet on uneven surface  ?activity ? ? ?Assist Walk 10 feet on uneven surfaces activity did not occur: Safety/medical concerns ? ? ?Assist level: Supervision/Verbal cueing ?Assistive device: Walker-rolling  ? ?Wheelchair ? ? ? ? ?Assist Is the patient using a wheelchair?: No ?Type of Wheelchair: Manual ?  ? ?Wheelchair assist level: Minimal Assistance - Patient > 75% ?Max wheelchair distance: 150  ? ? ?Wheelchair 50 feet with 2 turns activity ? ? ? ?Assist ? ?  ?  ? ? ?Assist Level: Minimal Assistance - Patient > 75%  ? ?Wheelchair 150 feet activity  ? ? ? ?Assist ?   ? ? ?Assist Level:  Minimal Assistance - Patient > 75%  ? ?Blood pressure 112/63, pulse 62, temperature 98.5 ?F (36.9 ?C), temperature source Oral, resp. rate 18, height 5\' 1"  (1.549 m), weight 41.9 kg, SpO2 99 %. ? ?Medical Problem List and Plan: ?1. 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            -patient may shower ?            -stable  for ALF in am  ? -Continue CIR therapies including PT, OT, and SLP  ?2.  Antithrombotics: ?-DVT/anticoagulation:  Pharmaceutical: Lovenox ?            -antiplatelet therapy: Aspirin 325 mg daily and Plavix and 5 mg day x90 days ending 01/07/2022 then monotherapy with aspirin 81 mg thereafter ?3. Pain Management: Tylenol as needed ?Left trap pain, resolved  ?4. Depression: continue Lexapro 20 mg daily.  Psychiatry services consulted ?            -antipsychotic agents: N/A ?5. Neuropsych: This patient he is capable of making decisions on his own behalf. ?6. Skin/Wound Care: Routine skin checks ?7. Fluids/Electrolytes/Nutrition: Routine in and out with follow-up chemistries ?8.  Hypothyroidism.  Synthroid ?9.  Hyperlipidemia.  Continue Crestor ?10.  HTN  ?Vitals:  ? 12/11/21 2035 12/12/21 0622  ?BP: 113/77 112/63  ?Pulse: 71 62  ?Resp: 18 18  ?Temp: 98.3 ?F (36.8 ?C) 98.5 ?F (36.9 ?C)  ?SpO2: 99%   ?Given hypotension, decrease lopressor to HS ? ?11.  History of alcohol/tobacco abuse.  Now in remission. Pt states it has been 5 yrs since he drank ETOH last ETOH + on tox was 10/2015, denies tobacco use  ?12.  COVID-19 positive.  Completed course of Paxlovid and airborne precautions discontinued 3/26 ? ?13 Underweight: BMI 17.45: provide dietary education. Pt c/o poor appetite, add Megace now eating 100% of meals  ?  ? ?LOS: ?16 days ?A FACE TO FACE EVALUATION WAS PERFORMED ? ?4/26 Ralph Jackson ?12/12/2021, 9:47 AM  ? ? ? ?

## 2021-12-12 NOTE — Progress Notes (Signed)
Physical Therapy Session Note ? ?Patient Details  ?Name: Ralph Jackson ?MRN: [id number] ?Date of Birth: Apr 09, 1946 ? ?Today's Date: 12/12/2021 ?PT Individual Time: 1355-1405 ?PT Individual Time Calculation (min): 10 min  ? ?Short Term Goals: ?Week 1:  PT Short Term Goal 1 (Week 1): Pt will perform bed mobility with consistent supervision and no use of bed features. ?PT Short Term Goal 1 - Progress (Week 1): Met ?PT Short Term Goal 2 (Week 1): Pt will perform all functional standing transfers with supervision and safe technique. ?PT Short Term Goal 2 - Progress (Week 1): Met ?PT Short Term Goal 3 (Week 1): Pt will ambulate with improved R trendelenberg using RW with close supervision. ?PT Short Term Goal 3 - Progress (Week 1): Met ?PT Short Term Goal 4 (Week 1): Pt will complete at least 4 steps with RHR only with consistent MinA. ?PT Short Term Goal 4 - Progress (Week 1): Met ?Week 2:  PT Short Term Goal 1 (Week 2): STG=LTG due to ELOS ? ? ?Skilled Therapeutic Interventions/Progress Updates:  ? ?Pt received sitting in WC and agreeable to PT. Pt performed sit<>stnad transfer with RW and supervision assist from PT for safety. Pt performed ambulatory transfer to toilet with RW. Able to complete all clothing management with supervision assist to urinate sitting on toilet. Pt performed ambulatory transfer to room. OT then present for PT treatment. Left pt with OT.  ? ?   ? ?Therapy Documentation ?Precautions:  ?Precautions ?Precautions: Fall ?Precaution Comments: R hemi, Bil hip weakness, HOH ?Restrictions ?Weight Bearing Restrictions: No ? ?  ?Vital Signs: ?Therapy Vitals ?Temp: 98.2 ?F (36.8 ?C) ?Temp Source: Oral ?Pulse Rate: 72 ?Resp: 16 ?BP: 112/66 ?Patient Position (if appropriate): Lying ?Oxygen Therapy ?SpO2: 98 % ?O2 Device: Room Air ?Pain: ?denies  ? ? ? ?Therapy/Group: Individual Therapy ? ?Lorie Phenix ?12/12/2021, 5:22 PM  ?

## 2021-12-12 NOTE — Progress Notes (Signed)
Occupational Therapy Session Note ? ?Patient Details  ?Name: Ralph Jackson ?MRN: [id number] ?Date of Birth: 1945-10-16 ? ?Today's Date: 12/12/2021 ?OT Individual Time: 0929-5747 ?OT Individual Time Calculation (min): 57 min + 23 min ? ? ?Short Term Goals: ?Week 1:  OT Short Term Goal 1 (Week 1): STG = LTG 2/2 ELOS ? ?Skilled Therapeutic Interventions/Progress Updates:  ? Session 1 [phone]): Pt received seated in recliner, no c/o pain, agreeable to therapy. Session focus on self-care retraining, activity tolerance, RUE NMR/FMC in prep for improved ADL/IADL/func mobility performance + decreased caregiver burden. Declines shower/need for ADL. But did change out shirt independently when pointed out that he had spilled coffee over shirt. Short ambulatory transfers to and from w/c with close S to CGA with RW due to intermittently catching RW on furniture. Dependent w/c transport to and from gym. ? ?Administered the [place] Peg Test (NHPT)  with the following results: ?-R:1 min 43 seconds ?-L: 48 seconds ( 37-45 seconds is considered below average ; greater than 45 seconds is considered poor) thus demonstrating deficits in fine motor skills, hand dexterity, and speed. ? ?Pt practiced shuffling, flipping over, and stacking cards with RUE to address [facility] deficits. Significant difficulty stacking cards neatly/ 1 at a time/ shuffling bimanually.  ? ?Finally, to target RUE/RLE, complete 8 continuous min on nustep at level 5/10 resistance, average step length of 24 steps/min. Intermittent assist to avoid excessive R hip adduction. ? ?Ambulatory toilet transfer in room CGA due to attempting to sit down prior to being fully squared up in front of toilet and due to catching RW on BSC. Otherwise complete toileting tasks close S post continent void of bladder.  ? ?MD in/out morning rounds during session. ? ?Pt left seated in recliner with safety belt alarm engaged, call bell in reach, and all immediate needs met.   ? ? ?Session 2 [phone]): Pt received exiting bathroom with PT, no c/o pain, agreeable to therapy. Session focus on activity tolerance, RUE NMR/strengthening in prep for improved ADL/IADL/func mobility performance + decreased caregiver burden. Short ambulatory transfer > w/c with cues to bring RW and light assist to unhook RW from edge of bed. Total A w/c transport to and from gym. ? ?Pt complete 3x10 overhead tosses with large ball against rebounder. 1x10 of the following with RUE / 4 lb dumbbell: cross body and forward punches, biceps curls, overhead reaches, and sideways circles. ? ?Returned to supine with distant S and able to doff shoes himself. ? ?Pt left semi-reclined in bed with bed alarm engaged, call bell in reach, and all immediate needs met.  ? ? ?Therapy Documentation ?Precautions:  ?Precautions ?Precautions: Fall ?Precaution Comments: R hemi, Bil hip weakness, HOH ?Restrictions ?Weight Bearing Restrictions: No ? ?Pain: denies ?  ?ADL: See Care Tool for more details. ?Therapy/Group: Individual Therapy ? ?Volanda Napoleon MS, OTR/L ? ?12/12/2021, 6:55 AM ?

## 2021-12-12 NOTE — Progress Notes (Signed)
Inpatient Rehabilitation Care Coordinator ?Discharge Note  ? ?Patient Details  ?Name: Ralph Jackson ?MRN: [id number] ?Date of Birth: 11-06-1945 ? ? ?Discharge location: ALF - [facility] ? ?Length of Stay: 17 Days ? ?Discharge activity level: Sup ? ?Home/community participation: ALF ? ?Patient response PV:XYIAXK Literacy - How often do you need to have someone help you when you read instructions, pamphlets, or other written material from your doctor or pharmacy?: Often ? ?Patient response PV:VZSMOL Isolation - How often do you feel lonely or isolated from those around you?: Never ? ?Services provided included: SW, Neuropsych, Pharmacy, TR, CM, RN, SLP, OT, PT, RD, MD ? ?Financial Services:  ?Field seismologist Utilized: Private Insurance ?[employer] ? ?Choices offered to/list presented to: daughter and patient ? ?Follow-up services arranged:  ?Outpatient ?   ?Outpatient Servicies: [facility] (3x) ?  ?  ? ?Patient response to transportation need: ?Is the patient able to respond to transportation needs?: Yes ?In the past 12 months, has lack of transportation kept you from medical appointments or from getting medications?: No ?In the past 12 months, has lack of transportation kept you from meetings, work, or from getting things needed for daily living?: No ? ? ? ?Comments (or additional information): ? ?Patient/Family verbalized understanding of follow-up arrangements:  Yes ? ?Individual responsible for coordination of the follow-up plan: Victorino Dike [phone] ? ?Confirmed correct DME delivered: Andria Rhein 12/12/2021   ? ?Andria Rhein ?

## 2021-12-12 NOTE — Progress Notes (Signed)
Patient ID: Ralph Jackson, male   DOB: 12-Apr-1946, 76 y.o.   MRN: [id number] ? ?Team Conference Report to Patient/Family ? ?Team Conference discussion was reviewed with the patient and caregiver, including goals, any changes in plan of care and target discharge date.  Patient and caregiver express understanding and are in agreement.  The patient has a target discharge date of 12/13/21. ? ?SW spoke with patient daughter and provided team conference updates. ALF completed face to face visit with patient today. Anticipating discharge tomorrow pending COVID results and TB/Chest X ray Results.  ? ?Andria Rhein ?12/12/2021, 1:15 PM  ?

## 2021-12-12 NOTE — Progress Notes (Signed)
Physical Therapy Session Note ? ?Patient Details  ?Name: Ralph Jackson ?MRN: [id number] ?Date of Birth: September 14, 1945 ? ?Today's Date: 12/12/2021 ?PT Individual Time: 0998-3382 ?PT Individual Time Calculation (min): 24 min  ? ?Short Term Goals: ?Week 2:  PT Short Term Goal 1 (Week 2): STG=LTG due to ELOS ? ?Skilled Therapeutic Interventions/Progress Updates:  ?   ?Pt sitting in recliner to start - agreeable to PT tx and denies pain. Donned shoes with minA. Sit<>stand to RW with CGA and ambulated 161f + 1782fwith CGA to start, fading to light minA and RW. Assist needed for steadying and cueing needed to keep body within walker frame and increase R>L step length. 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 Concluded session with 1x10 sit<>stands from recliner height to RW - cues needed for hand placement to armrests and worked on full extension in standing to promote upright posture. Safety belt alarm on, all needs met, BLE elevated, call bell in reach. ? ?Therapy Documentation ?Precautions:  ?Precautions ?Precautions: Fall ?Precaution Comments: R hemi, Bil hip weakness, HOH ?Restrictions ?Weight Bearing Restrictions: No ?General: ?  ? ? ?Therapy/Group: Individual Therapy ? ?[name] ?12/12/2021, 7:51 AM  ?

## 2021-12-12 NOTE — Progress Notes (Signed)
Physical Therapy Session Note ? ?Patient Details  ?Name: Ralph Jackson ?MRN: [id number] ?Date of Birth: 29-Dec-1945 ? ?Today's Date: 12/12/2021 ?PT Individual Time: 0915-1000 ?PT Individual Time Calculation (min): 45 min  ? ?Short Term Goals: ?Week 2:  PT Short Term Goal 1 (Week 2): STG=LTG due to ELOS ? ?Skilled Therapeutic Interventions/Progress Updates:  ?  Pt missed 15 min initially at beginning of therapy session due to this therapist being tied up with another patient. Upon being received in bed in his room pt found to be engaged in discussion with ALF representative to prepare for d/c later today or tomorrow, pt given time to finish discussion. Pt ultimately missed 30 min of scheduled therapy session. Pt with no complaints of pain today, does report he has soiled his brief. Pt's brief found to be soaked in urine. Supine to sit at mod I level. Sit to stand with Supervision to RW. Pt is min A for pericare and brief change in standing. Pt able to don pants with min A. Ambulation x 100 ft with RW and CGA needed for balance, kyphotic posture with decreased stance time on RLE and decreased RLE hip and knee flexion noted during gait. Pt also requires assist for safe RW management due to significant path deviation to the L and inattention to barriers in L visual field. Ambulation through obstacle course weaving through cones with RW and CGA for balance with max cueing for safe RW management. Pt reports urge to urinate. Toilet transfer with RW and Supervision, min A needed for 3/3 toileting steps. Pt left seated in recliner in room with needs in reach, quick release belt and chair alarm in place at end of session. ? ?Therapy Documentation ?Precautions:  ?Precautions ?Precautions: Fall ?Precaution Comments: R hemi, Bil hip weakness, HOH ?Restrictions ?Weight Bearing Restrictions: No ?General: ?PT Amount of Missed Time (min): 30 Minutes ?PT Missed Treatment Reason: Unavailable (Comment);Other (Comment) (pt meeting  with ALF representative; therapist with another patient) ? ? ? ? ? ?Therapy/Group: Individual Therapy ? ? ?Excell Seltzer, PT, DPT, CSRS ? ?12/12/2021, 12:18 PM  ?

## 2021-12-13 ENCOUNTER — Other Ambulatory Visit (HOSPITAL_COMMUNITY): Payer: Self-pay

## 2021-12-13 LAB — SARS CORONAVIRUS 2 (TAT 6-24 HRS): SARS Coronavirus 2: NEGATIVE

## 2021-12-13 NOTE — Plan of Care (Signed)
?  Problem: RH Balance Goal: LTG Patient will maintain dynamic standing balance (PT) Description: LTG:  Patient will maintain dynamic standing balance with assistance during mobility activities (PT) Outcome: Completed/Met   Problem: Sit to Stand Goal: LTG:  Patient will perform sit to stand with assistance level (PT) Description: LTG:  Patient will perform sit to stand with assistance level (PT) Outcome: Completed/Met   Problem: RH Bed Mobility Goal: LTG Patient will perform bed mobility with assist (PT) Description: LTG: Patient will perform bed mobility with assistance, with/without cues (PT). Outcome: Completed/Met   Problem: RH Bed to Chair Transfers Goal: LTG Patient will perform bed/chair transfers w/assist (PT) Description: LTG: Patient will perform bed to chair transfers with assistance (PT). Outcome: Completed/Met   Problem: RH Car Transfers Goal: LTG Patient will perform car transfers with assist (PT) Description: LTG: Patient will perform car transfers with assistance (PT). Outcome: Completed/Met   Problem: RH Ambulation Goal: LTG Patient will ambulate in controlled environment (PT) Description: LTG: Patient will ambulate in a controlled environment, # of feet with assistance (PT). Outcome: Completed/Met Goal: LTG Patient will ambulate in home environment (PT) Description: LTG: Patient will ambulate in home environment, # of feet with assistance (PT). Outcome: Completed/Met   Problem: RH Stairs Goal: LTG Patient will ambulate up and down stairs w/assist (PT) Description: LTG: Patient will ambulate up and down # of stairs with assistance (PT) Outcome: Completed/Met   

## 2021-12-13 NOTE — Patient Care Conference (Signed)
Inpatient RehabilitationTeam Conference and Plan of Care Update ?Date: 12/12/2021   Time: 10:33 AM  ? ? ?Patient Name: Ralph Jackson      ?Medical Record Number: [id number]  ?Date of Birth: 1946-04-01 ?Sex: Male         ?Room/Bed: 5C16C/5C16C-01 ?Payor Info: Payor: Multimedia programmer / Plan: [facility] MEDICARE / Product Type: *No Product type* /   ? ?Admit Date/Time:  11/26/2021  2:52 PM ? ?Primary Diagnosis:  Subcortical infarction (HCC) ? ?Hospital Problems: Principal Problem: ?  Subcortical infarction [facility] ? ? ? ?Expected Discharge Date: Expected Discharge Date: 12/13/21 ? ?Team Members Present: ?Physician leading conference: Dr. Claudette Laws ?Social Worker Present: Lavera Guise, BSW ?Nurse Present: Chana Bode, RN ?PT Present: Grier Rocher, PT ?OT Present: Annye English, OT ?SLP Present: Eilene Ghazi, SLP ?PPS Coordinator present : Fae Pippin, SLP ? ?   Current Status/Progress Goal Weekly Team Focus  ?Bowel/Bladder ? ?   Continent        ?Swallow/Nutrition/ Hydration ? ?           ?ADL's ? ? set-up A seated UBD/bathing, S bathing at sit to stand level in shower, S LBD and toileting, S to CGA ambulatory toilet transfers with RW  CGA transfers (downgraded) 24/7 S  RUE NMR, transfer/balance/self-care retraining, pt/family AE/DME education, activity tolerance   ?Mobility ? ?           ?Communication ? ?           ?Safety/Cognition/ Behavioral Observations ?           ?Pain ? ?   Prn for neck pain   Pain managed   Monitor need for and effectiveness of prn meds  ?Skin ? ?   N/a        ? ? ?Discharge Planning:  ?Potentially d/c to ALF Wednesday or Thursday   ?Team Discussion: ?Doing well overall; awaiting ALF assessment ?Patient on target to meet rehab goals: ?yes ? ?*See Care Plan and progress notes for long and short-term goals.  ? ?Revisions to Treatment Plan:  ?Family with medical emergency and unable to provide care at discharge. Patient is accepting of ALF recommendation. ?  ?Teaching  Needs: ?Safety, medications, dietary modifications, transfers, etc  ?Current Barriers to Discharge: ?Home enviroment access/layout and Lack of/limited family support ? ?Possible Resolutions to Barriers: ?ALF recommended ?DME: W/C, TTB, ?  ? ? Medical Summary ?Current Status: apepetite is good, daughter now caring for her husband who had recent MI ? Barriers to Discharge: Medical stability ?  ?Possible Resolutions to [employer]: SW to assist in NHP, may reduce therapy to qd ? ? ?Continued Need for Acute Rehabilitation Level of Care: The patient requires daily medical management by a physician with specialized training in physical medicine and rehabilitation for the following reasons: ?Direction of a multidisciplinary physical rehabilitation program to maximize functional independence : Yes ?Medical management of patient stability for increased activity during participation in an intensive rehabilitation regime.: Yes ?Analysis of laboratory values and/or radiology reports with any subsequent need for medication adjustment and/or medical intervention. : Yes ? ? ?I attest that I was present, lead the team conference, and concur with the assessment and plan of the team. ? ? ?Chana Bode B ?12/13/2021, 9:11 AM  ? ? ? ? ? ? ?

## 2021-12-13 NOTE — Progress Notes (Signed)
Inpatient Rehabilitation Discharge Medication Review by a Pharmacist ? ?A complete drug regimen review was completed for this patient to identify any potential clinically significant medication issues. ? ?High Risk Drug Classes Is patient taking? Indication by Medication  ?Antipsychotic No   ?Anticoagulant No   ?Antibiotic No   ?Opioid No   ?Antiplatelet Yes ASA 325, plavix 75 - stroke prevention  ?Hypoglycemics/insulin No   ?Vasoactive Medication Yes Lopressor - HTN  ?Chemotherapy No   ?Other Yes Crestor - HLD ?Synthroid - Hypothyroidism ?Prilosec - GERD ?Lexapro - mood disorder ?Prolia - osteoporosis  ?Claritin - allergies ?MIV - supplementation  ? ? ? ?Type of Medication Issue Identified Description of Issue Recommendation(s)  ?Drug Interaction(s) (clinically significant) ?    ?Duplicate Therapy ?    ?Allergy ?    ?No Medication Administration End Date ?    ?Incorrect Dose ?    ?Additional Drug Therapy Needed ?    ?Significant med changes from prior encounter (inform family/care partners about these prior to discharge).    ?Other ?    ? ? ?Clinically significant medication issues were identified that warrant physician communication and completion of prescribed/recommended actions by midnight of the next day:  No ? ? ?Time spent performing this drug regimen review (minutes):  30 ? ? ?Lavella Myren BS, PharmD, BCPS ?Clinical Pharmacist ?12/13/2021 12:26 PM ?

## 2021-12-13 NOTE — Progress Notes (Signed)
Patient discharged off of unit with all belongings via [name]. Called daughter, Victorino Dike, and told her his wheelchair and other equipment needed to be picked up and could not go by [name]. She states she will be up here before the weekend to pick it up. No complications noted at this time.  ?Meredeth Ide Dayane [place]   ?

## 2021-12-24 ENCOUNTER — Encounter: Payer: Medicare Other | Admitting: Registered Nurse

## 2023-10-12 ENCOUNTER — Ambulatory Visit (HOSPITAL_BASED_OUTPATIENT_CLINIC_OR_DEPARTMENT_OTHER)
Admission: EM | Admit: 2023-10-12 | Discharge: 2023-10-12 | Disposition: A | Discharge status: Home / Self Care | Payer: 59 | Attending: Family Medicine | Admitting: Family Medicine

## 2023-10-12 ENCOUNTER — Encounter (HOSPITAL_BASED_OUTPATIENT_CLINIC_OR_DEPARTMENT_OTHER): Payer: Self-pay | Admitting: Emergency Medicine

## 2023-10-12 DIAGNOSIS — J069 Acute upper respiratory infection, unspecified: Secondary | ICD-10-CM

## 2023-10-12 DIAGNOSIS — R051 Acute cough: Secondary | ICD-10-CM

## 2023-10-12 DIAGNOSIS — S025XXA Fracture of tooth (traumatic), initial encounter for closed fracture: Secondary | ICD-10-CM

## 2023-10-12 DIAGNOSIS — K029 Dental caries, unspecified: Secondary | ICD-10-CM

## 2023-10-12 DIAGNOSIS — R509 Fever, unspecified: Secondary | ICD-10-CM

## 2023-10-12 LAB — POC COVID19/FLU A&B COMBO
Covid Antigen, POC: NEGATIVE
Influenza A Antigen, POC: NEGATIVE
Influenza B Antigen, POC: NEGATIVE

## 2023-10-12 MED ORDER — CEFDINIR 300 MG PO CAPS: 300.0000 mg | ORAL_CAPSULE | Freq: Two times a day (BID) | ORAL | 0 refills | Status: AC

## 2023-10-12 NOTE — Discharge Instructions (Signed)
 Flu and COVID test are negative.  Chest is clear.  Oxygen saturation is 98% on room air.  Patient appears ill but exam is mostly normal.  He has horrible broken teeth with crusted matted areas around his broken teeth, erythema of the gums, and tenderness of the gums.  Will treat with antibiotics for dental caries versus early dental abscess.  Cefdinir , 300 mg, twice daily for 7 days.  Encouraged daughter to get her dad to a dentist and have all these teeth removed.  This is a definite risk for chronic infection.  Follow-up if symptoms do not improve, worsen or new symptoms occur.

## 2023-10-12 NOTE — ED Provider Notes (Signed)
 [facility]    [name]: [id number] Arrival date & time: 10/12/23  1308      History   Chief Complaint Chief Complaint  Patient presents with   Fever    HPI [facility] is a 78 y.o. male.   Patient's daughter states patient has been exposed to flu A on Thursday.  Patient started running a fever, cough and congestion on Friday.  Patient has taken Tylenol  and Ibuprofen .  He has bad teeth and also has Dementia.  His daughter reports that he is very afraid of a dentist and will not get his teeth taken care of.     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   Depression with anxiety 11/19/2021   COVID-19 virus infection 11/16/2021   CVA (cerebral vascular accident) (HCC) 11/15/2021   Hypothyroidism 11/15/2021   GERD (gastroesophageal reflux disease) 11/15/2021   Tobacco abuse 11/15/2021   HLD (hyperlipidemia) 11/15/2021   Pulmonary edema 06/28/2021   Acute respiratory failure with hypoxia (HCC)    Closed displaced fracture of right femoral neck (HCC) 06/19/2021   Alcohol  abuse, in remission 06/19/2021   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   HTN (hypertension) 06/19/2021   Protein-calorie malnutrition, severe (HCC) 07/08/2014   TSH elevation 07/08/2014   FTT (failure to thrive) in adult 07/08/2014   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 Authorizing Provider  acetaminophen  (TYLENOL ) 325 MG tablet Take 2 tablets (650 mg total) by mouth every 4 (four) hours as needed for mild pain (or temp > 37.5 C (99.5 F)). 12/06/21  Yes Angiulli, Toribio PARAS, PA-C  aspirin  EC 325 MG EC tablet Take 1 tablet (325 mg total) by mouth daily. 12/06/21  Yes Angiulli, Toribio PARAS, PA-C  cefdinir  (OMNICEF ) 300 MG capsule Take 1 capsule (300 mg total) by mouth 2 (two) times daily after a meal for 7 days. 10/12/23 10/19/23 Yes Ival Domino, FNP  clopidogrel  (PLAVIX ) 75 MG tablet Take 1 tablet (75 mg total) by mouth daily. 12/06/21  Yes Angiulli, Toribio PARAS, PA-C  escitalopram  (LEXAPRO ) 20 MG tablet Take 1 tablet (20 mg total) by mouth daily. 12/06/21  Yes Angiulli, Toribio PARAS, PA-C  levothyroxine  (SYNTHROID ) 50 MCG tablet Take 1 tablet (50 mcg total) by mouth daily. 12/06/21  Yes Angiulli, Toribio PARAS, PA-C  loratadine  (CLARITIN ) 10 MG tablet Take 1 tablet (10 mg total) by mouth daily. Patient taking differently: Take 10 mg by mouth at bedtime. 12/12/20  Yes Wieters, Hallie C, PA-C  metoprolol  tartrate (LOPRESSOR ) 25 MG tablet Take 1/2 tablet (12.5 mg total) by mouth every evening. 12/11/21  Yes  Angiulli, Toribio PARAS, PA-C  Multiple Vitamins-Minerals (ONE-A-DAY MENS 50+) TABS Take 1 tablet by mouth daily.   Yes [provider]  omeprazole  (PRILOSEC) 20 MG capsule Take 1 capsule (20 mg total) by mouth daily. 12/06/21  Yes Angiulli, Toribio PARAS, PA-C  PROLIA 60 MG/ML SOSY injection Inject 60 mg into the skin See admin instructions. Every 6 months 06/12/21  Yes [provider]  rosuvastatin  (CRESTOR ) 20 MG tablet Take 1 tablet (20 mg total) by mouth daily. 12/06/21  Yes Angiulli, Toribio PARAS, PA-C    Family History Family History  Problem Relation Age of Onset    Osteoporosis Neg Hx     Social History Social History   Tobacco Use   Smoking status: Never   Smokeless tobacco: Current  Vaping Use   Vaping status: Never Used  Substance Use Topics   Alcohol  use: Not Currently    Alcohol /week: 6.0 standard drinks of alcohol     Types: 3 Glasses of wine, 3 Cans of beer per week    Comment: Pt states he drinks 3/5 of wine daily   Drug use: No     Allergies   Patient has no known allergies.   Review of Systems Review of Systems  Constitutional:  Positive for fever. Negative for chills.  HENT:  Positive for congestion. Negative for ear pain and sore throat.        Broken, bad teeth  Eyes:  Negative for pain and visual disturbance.  Respiratory:  Positive for cough. Negative for shortness of breath.   Cardiovascular:  Negative for chest pain and palpitations.  Gastrointestinal:  Negative for abdominal pain, constipation, diarrhea, nausea and vomiting.  Genitourinary:  Negative for dysuria and hematuria.  Musculoskeletal:  Negative for arthralgias and back pain.  Skin:  Negative for color change and rash.  Neurological:  Negative for seizures and syncope.  All other systems reviewed and are negative.    Physical Exam Triage Vital Signs ED Triage Vitals  Encounter Vitals Group     BP 10/12/23 1607 (!) 143/87     Systolic BP Percentile --      Diastolic BP Percentile --      Pulse Rate 10/12/23 1607 (!) 54     Resp 10/12/23 1607 16     Temp 10/12/23 1607 98.4 F (36.9 C)     Temp Source 10/12/23 1607 Oral     SpO2 10/12/23 1607 98 %     Weight --      Height --      Head Circumference --      Peak Flow --      Pain Score 10/12/23 1609 0     Pain Loc --      Pain Education --      Exclude from Growth Chart --    No data found.  Updated Vital Signs BP (!) 143/87 (BP Location: Left Arm)   Pulse (!) 54   Temp 98.4 F (36.9 C) (Oral)   Resp 16   SpO2 98%   Visual Acuity Right Eye Distance:   Left Eye Distance:    Bilateral Distance:    Right Eye Near:   Left Eye Near:    Bilateral Near:     Physical Exam Vitals and nursing note reviewed.  Constitutional:      General: He is not in acute distress.    Appearance: He is underweight. He is ill-appearing. He is not toxic-appearing.  HENT:     Head: Normocephalic and atraumatic.  Right Ear: There is impacted cerumen (Canal completely obstructed, unable to see the tympanic membrane.).     Left Ear: There is impacted cerumen (Canal completely obstructed, unable to see the tympanic membrane.).     Ears:     Comments: Uses a hearing aid on the right.    Nose: Congestion and rhinorrhea present. Rhinorrhea is clear.     Right Sinus: No maxillary sinus tenderness or frontal sinus tenderness.     Left Sinus: No maxillary sinus tenderness or frontal sinus tenderness.     Mouth/Throat:     Lips: Pink.     Mouth: Mucous membranes are moist.     Dentition: Abnormal dentition. Dental tenderness and dental caries present.     Pharynx: Uvula midline. No oropharyngeal exudate or posterior oropharyngeal erythema.     Tonsils: No tonsillar exudate.     Comments: The patient has multiple broken teeth, with crusting around the close broken teeth and some erythema of the gums with tenderness. Eyes:     Conjunctiva/sclera: Conjunctivae normal.     Pupils: Pupils are equal, round, and reactive to light.  Cardiovascular:     Rate and Rhythm: Normal rate and regular rhythm.     Heart sounds: S1 normal and S2 normal. No murmur heard. Pulmonary:     Effort: Pulmonary effort is normal. No respiratory distress.     Breath sounds: Normal breath sounds. No decreased breath sounds, wheezing, rhonchi or rales.  Abdominal:     Palpations: Abdomen is soft.     Tenderness: There is no abdominal tenderness.  Musculoskeletal:        General: No swelling.     Cervical back: Neck supple.  Lymphadenopathy:     Head:     Right side of head: Submental and submandibular  adenopathy present. No tonsillar, preauricular or posterior auricular adenopathy.     Left side of head: Submental and submandibular adenopathy present. No tonsillar, preauricular or posterior auricular adenopathy.     Cervical: No cervical adenopathy.     Right cervical: No superficial cervical adenopathy.    Left cervical: No superficial cervical adenopathy.  Skin:    General: Skin is warm and dry.     Capillary Refill: Capillary refill takes less than 2 seconds.     Findings: No rash.  Neurological:     Mental Status: He is alert. He is confused.     Comments: Patient is pleasant and follows commands but his daughter reports that he has dementia and a poor memory.  He is in a wheelchair because it is difficult for him to walk.  Psychiatric:        Mood and Affect: Mood normal.      UC Treatments / Results  Labs (all labs ordered are listed, but only abnormal results are displayed) Labs Reviewed  POC COVID19/FLU A&B COMBO - Normal    EKG   Radiology No results found.  Procedures Procedures (including critical care time)  Medications Ordered in UC Medications - No data to display  Initial Impression / Assessment and Plan / UC Course  I have reviewed the triage vital signs and the nursing notes.  Pertinent labs & imaging results that were available during my care of the patient were reviewed by me and considered in my medical decision making (see chart for details).     Rapid flu and COVID are negative.  He does not look healthy, he even looks ill.  But his exam is mostly normal with  the exception of his mouth and teeth.  He has multiple broken teeth with crusted areas, erythema of the gums, tenderness of the gums.  Will treat with cefdinir , 300 mg, twice daily for 7 days.  Get plenty of fluids and rest.  Encouraged to get a dentist to remove all the broken teeth.  Follow-up if symptoms do not improve, worsen or new symptoms occur. Final Clinical Impressions(s) / UC  Diagnoses   Final diagnoses:  Acute cough  Fever, unspecified  Viral URI with cough  Dental caries  Broken teeth     Discharge Instructions      Flu and COVID test are negative.  Chest is clear.  Oxygen saturation is 98% on room air.  Patient appears ill but exam is mostly normal.  He has horrible broken teeth with crusted matted areas around his broken teeth, erythema of the gums, and tenderness of the gums.  Will treat with antibiotics for dental caries versus early dental abscess.  Cefdinir , 300 mg, twice daily for 7 days.  Encouraged daughter to get her dad to a dentist and have all these teeth removed.  This is a definite risk for chronic infection.  Follow-up if symptoms do not improve, worsen or new symptoms occur.     ED Prescriptions     Medication Sig Dispense Auth. Provider   cefdinir  (OMNICEF ) 300 MG capsule Take 1 capsule (300 mg total) by mouth 2 (two) times daily after a meal for 7 days. 14 capsule Ival Domino, FNP      PDMP not reviewed this encounter.   Ival Domino, FNP 10/12/23 1651

## 2023-10-12 NOTE — ED Triage Notes (Signed)
 Patient's daughter states patient has been exposed to flu A on Thursday.  Patient started running a fever, cough and congestion on Friday.  Patient has taken Tylenol  and Ibuprofen .
